# Patient Record
Sex: Female | Born: 1970
Health system: Southern US, Community
[De-identification: ages and names within clinical notes are randomized; demographics above are authoritative.]

## PROBLEM LIST (undated history)

## (undated) DIAGNOSIS — J189 Pneumonia, unspecified organism: Secondary | ICD-10-CM

## (undated) DIAGNOSIS — E785 Hyperlipidemia, unspecified: Secondary | ICD-10-CM

## (undated) DIAGNOSIS — D509 Iron deficiency anemia, unspecified: Secondary | ICD-10-CM

## (undated) DIAGNOSIS — I5032 Chronic diastolic (congestive) heart failure: Secondary | ICD-10-CM

## (undated) DIAGNOSIS — U071 COVID-19: Secondary | ICD-10-CM

## (undated) DIAGNOSIS — E668 Other obesity: Secondary | ICD-10-CM

## (undated) DIAGNOSIS — N182 Chronic kidney disease, stage 2 (mild): Secondary | ICD-10-CM

## (undated) DIAGNOSIS — R9389 Abnormal findings on diagnostic imaging of other specified body structures: Secondary | ICD-10-CM

## (undated) DIAGNOSIS — E559 Vitamin D deficiency, unspecified: Secondary | ICD-10-CM

## (undated) DIAGNOSIS — E669 Obesity, unspecified: Secondary | ICD-10-CM

## (undated) DIAGNOSIS — R0902 Hypoxemia: Secondary | ICD-10-CM

## (undated) DIAGNOSIS — E119 Type 2 diabetes mellitus without complications: Secondary | ICD-10-CM

## (undated) DIAGNOSIS — I10 Essential (primary) hypertension: Secondary | ICD-10-CM

## (undated) DIAGNOSIS — Z9889 Other specified postprocedural states: Secondary | ICD-10-CM

## (undated) DIAGNOSIS — Z5189 Encounter for other specified aftercare: Secondary | ICD-10-CM

## (undated) DIAGNOSIS — N939 Abnormal uterine and vaginal bleeding, unspecified: Secondary | ICD-10-CM

## (undated) DIAGNOSIS — G4730 Sleep apnea, unspecified: Secondary | ICD-10-CM

## (undated) HISTORY — DX: Hypoxemia: R09.02

## (undated) HISTORY — PX: DILATION AND CURETTAGE, DIAGNOSTIC / THERAPEUTIC: SUR384

## (undated) HISTORY — DX: Abnormal uterine and vaginal bleeding, unspecified: N93.9

## (undated) HISTORY — DX: Type 2 diabetes mellitus without complications: E11.9

## (undated) HISTORY — DX: Encounter for other specified aftercare: Z51.89

## (undated) HISTORY — DX: Chronic kidney disease, stage 2 (mild): N18.2

## (undated) HISTORY — DX: Hyperlipidemia, unspecified: E78.5

## (undated) HISTORY — DX: Sleep apnea, unspecified: G47.30

## (undated) HISTORY — DX: Vitamin D deficiency, unspecified: E55.9

## (undated) HISTORY — DX: Obesity, unspecified: E66.9

## (undated) HISTORY — DX: Pneumonia, unspecified organism: J18.9

## (undated) HISTORY — DX: Chronic diastolic (congestive) heart failure: I50.32

## (undated) HISTORY — DX: Other obesity: E66.8

## (undated) HISTORY — PX: CHOLECYSTECTOMY: SHX55

## (undated) HISTORY — DX: Iron deficiency anemia, unspecified: D50.9

## (undated) HISTORY — PX: TONSILLECTOMY AND ADENOIDECTOMY: SUR1326

## (undated) HISTORY — DX: Essential (primary) hypertension: I10

## (undated) HISTORY — DX: COVID-19: U07.1

---
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
## 2004-09-16 ENCOUNTER — Emergency Department: Payer: Self-pay | Admitting: Internal Medicine

## 2005-02-02 ENCOUNTER — Emergency Department: Payer: Self-pay | Admitting: Emergency Medicine

## 2005-04-08 ENCOUNTER — Encounter: Admission: RE | Admit: 2005-04-08 | Discharge: 2005-04-08 | Payer: Self-pay

## 2006-02-11 ENCOUNTER — Emergency Department: Payer: Self-pay | Admitting: Unknown Physician Specialty

## 2006-02-23 ENCOUNTER — Other Ambulatory Visit: Payer: Self-pay

## 2006-02-23 ENCOUNTER — Inpatient Hospital Stay: Payer: Self-pay | Admitting: Internal Medicine

## 2006-07-13 ENCOUNTER — Other Ambulatory Visit: Payer: Self-pay

## 2006-07-13 ENCOUNTER — Inpatient Hospital Stay: Payer: Self-pay | Admitting: *Deleted

## 2006-10-30 ENCOUNTER — Ambulatory Visit: Payer: Self-pay | Admitting: Internal Medicine

## 2007-02-18 ENCOUNTER — Emergency Department: Payer: Self-pay | Admitting: Unknown Physician Specialty

## 2007-04-25 ENCOUNTER — Emergency Department: Payer: Self-pay | Admitting: Emergency Medicine

## 2007-09-24 ENCOUNTER — Emergency Department: Payer: Self-pay | Admitting: Emergency Medicine

## 2008-04-13 ENCOUNTER — Inpatient Hospital Stay: Payer: Self-pay | Admitting: Internal Medicine

## 2009-03-24 ENCOUNTER — Emergency Department: Payer: Self-pay | Admitting: Emergency Medicine

## 2010-11-20 ENCOUNTER — Inpatient Hospital Stay: Payer: Self-pay | Admitting: Specialist

## 2011-05-14 ENCOUNTER — Emergency Department: Payer: Self-pay | Admitting: Internal Medicine

## 2011-05-14 LAB — URINALYSIS, COMPLETE
Glucose,UR: NEGATIVE mg/dL (ref 0–75)
Ketone: NEGATIVE
Nitrite: NEGATIVE
Squamous Epithelial: 7

## 2011-05-16 ENCOUNTER — Emergency Department: Payer: Self-pay | Admitting: Emergency Medicine

## 2011-05-16 LAB — URINALYSIS, COMPLETE
Bilirubin,UR: NEGATIVE
Glucose,UR: NEGATIVE mg/dL (ref 0–75)
Ketone: NEGATIVE
Ph: 6 (ref 4.5–8.0)
Protein: >=500
Specific Gravity: 1.027 (ref 1.003–1.030)
Squamous Epithelial: 7

## 2011-05-16 LAB — COMPREHENSIVE METABOLIC PANEL
Albumin: 2.8 g/dL — ABNORMAL LOW (ref 3.4–5.0)
BUN: 15 mg/dL (ref 7–18)
Calcium, Total: 8.4 mg/dL — ABNORMAL LOW (ref 8.5–10.1)
Chloride: 104 mmol/L (ref 98–107)
Creatinine: 1.15 mg/dL (ref 0.60–1.30)
EGFR (Non-African Amer.): 55 — ABNORMAL LOW
Glucose: 126 mg/dL — ABNORMAL HIGH (ref 65–99)
SGOT(AST): 14 U/L — ABNORMAL LOW (ref 15–37)
Sodium: 142 mmol/L (ref 136–145)

## 2011-05-16 LAB — CBC
MCH: 24.4 pg — ABNORMAL LOW (ref 26.0–34.0)
MCV: 80 fL (ref 80–100)
RBC: 4.51 10*6/uL (ref 3.80–5.20)
RDW: 16.7 % — ABNORMAL HIGH (ref 11.5–14.5)

## 2011-05-18 LAB — URINE CULTURE

## 2012-03-02 ENCOUNTER — Inpatient Hospital Stay: Payer: Self-pay | Admitting: Internal Medicine

## 2012-03-02 LAB — COMPREHENSIVE METABOLIC PANEL
BUN: 9 mg/dL (ref 7–18)
Calcium, Total: 7.9 mg/dL — ABNORMAL LOW (ref 8.5–10.1)
Chloride: 103 mmol/L (ref 98–107)
Co2: 28 mmol/L (ref 21–32)
Creatinine: 0.77 mg/dL (ref 0.60–1.30)
Osmolality: 281 (ref 275–301)
Potassium: 4.2 mmol/L (ref 3.5–5.1)
Sodium: 141 mmol/L (ref 136–145)
Total Protein: 7.5 g/dL (ref 6.4–8.2)

## 2012-03-02 LAB — PRO B NATRIURETIC PEPTIDE: B-Type Natriuretic Peptide: 549 pg/mL — ABNORMAL HIGH (ref 0–125)

## 2012-03-02 LAB — CBC
MCH: 25.8 pg — ABNORMAL LOW (ref 26.0–34.0)
MCHC: 30.8 g/dL — ABNORMAL LOW (ref 32.0–36.0)
RDW: 15.8 % — ABNORMAL HIGH (ref 11.5–14.5)
WBC: 6.3 10*3/uL (ref 3.6–11.0)

## 2012-03-03 LAB — BASIC METABOLIC PANEL
Chloride: 99 mmol/L (ref 98–107)
EGFR (Non-African Amer.): >60
Osmolality: 280 (ref 275–301)
Potassium: 3.6 mmol/L (ref 3.5–5.1)
Sodium: 140 mmol/L (ref 136–145)

## 2012-03-03 LAB — URINALYSIS, COMPLETE
Bilirubin,UR: NEGATIVE
Nitrite: NEGATIVE
Specific Gravity: 1.024 (ref 1.003–1.030)

## 2012-03-03 LAB — PREGNANCY, URINE: Pregnancy Test, Urine: NEGATIVE m[IU]/mL

## 2012-03-04 DIAGNOSIS — I517 Cardiomegaly: Secondary | ICD-10-CM

## 2012-06-08 ENCOUNTER — Emergency Department: Payer: Self-pay | Admitting: Emergency Medicine

## 2012-06-08 LAB — COMPREHENSIVE METABOLIC PANEL
Alkaline Phosphatase: 105 U/L (ref 50–136)
BUN: 9 mg/dL (ref 7–18)
Bilirubin,Total: 0.6 mg/dL (ref 0.2–1.0)
Calcium, Total: 8.4 mg/dL — ABNORMAL LOW (ref 8.5–10.1)
Chloride: 99 mmol/L (ref 98–107)
Co2: 35 mmol/L — ABNORMAL HIGH (ref 21–32)
Creatinine: 0.71 mg/dL (ref 0.60–1.30)
EGFR (African American): >60
Glucose: 164 mg/dL — ABNORMAL HIGH (ref 65–99)
Osmolality: 280 (ref 275–301)
Potassium: 3.9 mmol/L (ref 3.5–5.1)
SGOT(AST): 20 U/L (ref 15–37)
SGPT (ALT): 21 U/L (ref 12–78)
Total Protein: 8.2 g/dL (ref 6.4–8.2)

## 2012-06-08 LAB — CBC
HCT: 37.7 % (ref 35.0–47.0)
MCV: 81 fL (ref 80–100)
Platelet: 361 10*3/uL (ref 150–440)
RDW: 16.1 % — ABNORMAL HIGH (ref 11.5–14.5)

## 2012-06-08 LAB — CK TOTAL AND CKMB (NOT AT ARMC)
CK, Total: 172 U/L (ref 21–215)
CK-MB: 1.3 ng/mL (ref 0.5–3.6)

## 2012-11-04 ENCOUNTER — Ambulatory Visit: Payer: Self-pay

## 2012-11-04 LAB — HM MAMMOGRAPHY

## 2013-01-07 ENCOUNTER — Emergency Department: Payer: Self-pay | Admitting: Internal Medicine

## 2013-02-24 ENCOUNTER — Ambulatory Visit: Payer: Self-pay | Admitting: Obstetrics and Gynecology

## 2013-02-24 LAB — BASIC METABOLIC PANEL
ANION GAP: 1 — AB (ref 7–16)
BUN: 8 mg/dL (ref 7–18)
CALCIUM: 8.8 mg/dL (ref 8.5–10.1)
CO2: 37 mmol/L — AB (ref 21–32)
CREATININE: 0.82 mg/dL (ref 0.60–1.30)
Chloride: 100 mmol/L (ref 98–107)
EGFR (Non-African Amer.): >60
Glucose: 133 mg/dL — ABNORMAL HIGH (ref 65–99)
OSMOLALITY: 276 (ref 275–301)
POTASSIUM: 4.2 mmol/L (ref 3.5–5.1)
Sodium: 138 mmol/L (ref 136–145)

## 2013-02-24 LAB — CBC
HCT: 35.8 % (ref 35.0–47.0)
HGB: 10.7 g/dL — ABNORMAL LOW (ref 12.0–16.0)
MCH: 23.6 pg — ABNORMAL LOW (ref 26.0–34.0)
MCHC: 29.8 g/dL — AB (ref 32.0–36.0)
MCV: 79 fL — ABNORMAL LOW (ref 80–100)
Platelet: 389 10*3/uL (ref 150–440)
RBC: 4.51 10*6/uL (ref 3.80–5.20)
RDW: 16.5 % — AB (ref 11.5–14.5)
WBC: 7.4 10*3/uL (ref 3.6–11.0)

## 2013-03-01 ENCOUNTER — Ambulatory Visit: Payer: Self-pay | Admitting: Obstetrics and Gynecology

## 2013-03-03 LAB — PATHOLOGY REPORT

## 2013-06-17 ENCOUNTER — Emergency Department: Payer: Self-pay | Admitting: Emergency Medicine

## 2013-06-17 LAB — CBC WITH DIFFERENTIAL/PLATELET
BASOS PCT: 0.8 %
Basophil #: 0.1 10*3/uL (ref 0.0–0.1)
Eosinophil #: 0.4 10*3/uL (ref 0.0–0.7)
Eosinophil %: 4.8 %
HCT: 39.8 % (ref 35.0–47.0)
HGB: 11.9 g/dL — ABNORMAL LOW (ref 12.0–16.0)
Lymphocyte #: 2 10*3/uL (ref 1.0–3.6)
Lymphocyte %: 22.4 %
MCH: 24.1 pg — ABNORMAL LOW (ref 26.0–34.0)
MCHC: 29.8 g/dL — AB (ref 32.0–36.0)
MCV: 81 fL (ref 80–100)
Monocyte #: 0.6 x10 3/mm (ref 0.2–0.9)
Monocyte %: 6.7 %
NEUTROS ABS: 6 10*3/uL (ref 1.4–6.5)
Neutrophil %: 65.3 %
PLATELETS: 372 10*3/uL (ref 150–440)
RBC: 4.93 10*6/uL (ref 3.80–5.20)
RDW: 16.8 % — ABNORMAL HIGH (ref 11.5–14.5)
WBC: 9.2 10*3/uL (ref 3.6–11.0)

## 2013-06-17 LAB — COMPREHENSIVE METABOLIC PANEL
ALBUMIN: 2.9 g/dL — AB (ref 3.4–5.0)
ALK PHOS: 111 U/L
ALT: 15 U/L (ref 12–78)
Anion Gap: 4 — ABNORMAL LOW (ref 7–16)
BUN: 12 mg/dL (ref 7–18)
Bilirubin,Total: 0.4 mg/dL (ref 0.2–1.0)
CALCIUM: 8.4 mg/dL — AB (ref 8.5–10.1)
CO2: 34 mmol/L — AB (ref 21–32)
Chloride: 100 mmol/L (ref 98–107)
Creatinine: 0.89 mg/dL (ref 0.60–1.30)
Glucose: 97 mg/dL (ref 65–99)
OSMOLALITY: 275 (ref 275–301)
POTASSIUM: 3.8 mmol/L (ref 3.5–5.1)
SGOT(AST): 18 U/L (ref 15–37)
SODIUM: 138 mmol/L (ref 136–145)
TOTAL PROTEIN: 8.2 g/dL (ref 6.4–8.2)

## 2013-06-17 LAB — URINALYSIS, COMPLETE
BACTERIA: NONE SEEN
BLOOD: NEGATIVE
Bilirubin,UR: NEGATIVE
Glucose,UR: >=500 mg/dL (ref 0–75)
KETONE: NEGATIVE
LEUKOCYTE ESTERASE: NEGATIVE
NITRITE: NEGATIVE
PH: 7 (ref 4.5–8.0)
Protein: 100
Specific Gravity: 1.028 (ref 1.003–1.030)
WBC UR: 3 /HPF (ref 0–5)

## 2013-06-17 LAB — HCG, QUANTITATIVE, PREGNANCY

## 2013-06-17 LAB — LIPASE, BLOOD: LIPASE: 108 U/L (ref 73–393)

## 2013-09-25 ENCOUNTER — Emergency Department: Payer: Self-pay | Admitting: Emergency Medicine

## 2013-09-25 LAB — BASIC METABOLIC PANEL
ANION GAP: 6 — AB (ref 7–16)
BUN: 12 mg/dL (ref 7–18)
CALCIUM: 8.8 mg/dL (ref 8.5–10.1)
CHLORIDE: 101 mmol/L (ref 98–107)
Co2: 34 mmol/L — ABNORMAL HIGH (ref 21–32)
Creatinine: 0.75 mg/dL (ref 0.60–1.30)
EGFR (African American): >60
GLUCOSE: 134 mg/dL — AB (ref 65–99)
OSMOLALITY: 283 (ref 275–301)
Potassium: 4.2 mmol/L (ref 3.5–5.1)
Sodium: 141 mmol/L (ref 136–145)

## 2013-09-25 LAB — CBC WITH DIFFERENTIAL/PLATELET
BASOS ABS: 0.1 10*3/uL (ref 0.0–0.1)
Basophil %: 0.9 %
EOS ABS: 0.5 10*3/uL (ref 0.0–0.7)
EOS PCT: 5.7 %
HCT: 40.6 % (ref 35.0–47.0)
HGB: 12.5 g/dL (ref 12.0–16.0)
LYMPHS ABS: 1.9 10*3/uL (ref 1.0–3.6)
Lymphocyte %: 22.2 %
MCH: 25.2 pg — ABNORMAL LOW (ref 26.0–34.0)
MCHC: 30.7 g/dL — ABNORMAL LOW (ref 32.0–36.0)
MCV: 82 fL (ref 80–100)
MONO ABS: 0.7 x10 3/mm (ref 0.2–0.9)
Monocyte %: 8.4 %
Neutrophil #: 5.3 10*3/uL (ref 1.4–6.5)
Neutrophil %: 62.8 %
Platelet: 324 10*3/uL (ref 150–440)
RBC: 4.96 10*6/uL (ref 3.80–5.20)
RDW: 16.2 % — ABNORMAL HIGH (ref 11.5–14.5)
WBC: 8.5 10*3/uL (ref 3.6–11.0)

## 2013-09-28 LAB — BETA STREP CULTURE(ARMC)

## 2014-05-27 NOTE — H&P (Signed)
PATIENT NAME:  Sara Zhang, Sara Zhang MR#:  [id number] DATE OF BIRTH:  15-Apr-1970  DATE OF ADMISSION:  03/02/2012  CHIEF COMPLAINT: Shortness of breath.   HISTORY OF PRESENT ILLNESS:  This is a 44 year old female who has a history of cardiomyopathy with an EF of 35%. She states she ran out of her medications about a month ago, due to not being able to get them from the [facility]. About 1 week ago, she started noticing worsening shortness of breath with minimal exertion. This got worse last night with orthopnea symptoms, and worse today, comes in and has low O2 sats in the low 80s and dropping into the 70s with any exertion. She has had some chest tightness, but no chest pain and has no other complaints.   PAST MEDICAL HISTORY: 1.  Cardiomyopathy with an EF of 35%.  2.  Hypertension.  3.  Non-insulin-dependent diabetes.  4.  Morbid obesity.  5.  History of sleep apnea.  6.  Hyperlipidemia.  7.  Hypertension.   ALLERGIES: No known drug allergies.   CURRENT MEDICATIONS: Metformin 1000 mg b.i.d., clonidine 0.1 mg daily, glipizide 5 mg b.i.d., metoprolol 25 mg b.i.d., lisinopril 10 mg daily, Lasix 40 mg daily, potassium 10 mEq daily.   SOCIAL HISTORY: She drinks alcohol occasionally, does not smoke.   FAMILY HISTORY: Significant for diabetes.   REVIEW OF SYSTEMS: CONSTITUTIONAL: No fever or chills.  EYES: No blurred vision.  ENT: No hearing loss.  CARDIOVASCULAR: Some chest tightness.  PULMONARY: Some shortness of breath.  GASTROINTESTINAL: No nausea, vomiting, or diarrhea.  GENITOURINARY: No dysuria.  ENDOCRINE: No heat or cold intolerance.  INTEGUMENT: No rash.  MUSCULOSKELETAL: Occasional joint pain.  NEUROLOGIC: No numbness or weakness.   PHYSICAL EXAMINATION: VITAL SIGNS: Temperature 97.1, pulse 98, respirations 20, blood pressure 133/85, and O2 sat was 82% on room air.  GENERAL: This is a well-nourished, obese black female in some moderate respiratory distress.  HEENT: The  pupils are equal, round, and reactive to light. Sclerae nonicteric. Oral mucosa is moist.  Oropharynx is clear. Nasopharynx is clear.  NECK: Supple. No JVD, lymphadenopathy or thyromegaly.  CARDIOVASCULAR: Regular rate and rhythm. No murmurs, rubs, or gallops.  LUNGS: The lung fields sound clear in the upper regions, but there is very distant breath sounds due to her body habitus. Cannot hear any crackles or decreased breath sounds in the lower bases. She is mildly using accessory muscles. There is no dullness to percussion.  ABDOMEN: Obese, nontender, nondistended. Bowel sounds are positive. Unable to ascertain hepatic or splenic enlargement because of her body habitus.  EXTREMITIES: There is 2+ lower extremity edema.  NEUROLOGIC: Cranial nerves II through XII are intact. She is alert and oriented x4.  SKIN: Moist with no rash.   LABORATORY STUDIES: EKG shows sinus tachycardia with a rate of 103.   Chest x-ray shows CHF.   Troponin is 0.03, BNP is 549, hemoglobin 11.2, BUN 9, creatinine 0.77.   ASSESSMENT AND PLAN: 1.  Acute respiratory failure. This is secondary to the congestive heart failure. We will give her oxygen support and treat the underlying cause. See if we can wean her off her oxygen.  2.  Acute systolic congestive heart failure. She has an ejection fraction of 35%. She has been out of her medications. We will give her intravenous Lasix to diurese her acutely. We will restart her angiotensin converting enzyme inhibitor and her beta blocker. She had echo about a year ago, so I will not  repeat that at this point, since we know the underlying cause of the exacerbation.  2.  Hypertension. We will restart her medications. I am going to hold her clonidine for now and see how she does without it. I will increase her angiotensin converting enzyme inhibitor if needed rather than restarting her clonidine if she needs better control.  3.  Non-insulin-dependent diabetes. We will continue her  current medications with metoprolol and glipizide, give her sliding scale if necessary.  4.  Sleep apnea. She is wearing oxygen at night. We will continue that. She could probably benefit from continuous positive airway pressure. We will try to arrange for further testing as an outpatient.  5. Disposition.  She is seen at the [facility]. She had trouble getting medications.  We will get case management involved to see if they can help her coordinate with this; especially at discharge, she will need medications upon discharge.   TIME SPENT ON ADMISSION: 50 minutes.    ____________________________ Gracelyn Nurse, MD jdj:cc D: 03/02/2012 16:11:21 ET T: 03/02/2012 16:49:36 ET JOB#: [id number]  cc: Gracelyn Nurse, MD, <Dictator> [facility]  Gracelyn Nurse MD ELECTRONICALLY SIGNED 03/03/2012 17:22

## 2014-05-27 NOTE — Discharge Summary (Signed)
PATIENT NAME:  Sara Zhang, Sara Zhang MR#:  [id number] DATE OF BIRTH:  [birth year]  DATE OF ADMISSION:  03/02/2012 DATE OF DISCHARGE:  03/04/2012  PRIMARY CARE PHYSICIAN: [facility]   DISCHARGE DIAGNOSES: 1. Acute systolic congestive heart failure with cardiomyopathy, ejection fraction of 25% to 35%, moderate global hypokinesis.  2. Acute respiratory failure, now chronic, pulse oximetry 82% on room air.  3. Sleep apnea. 4. Obesity.  5. Hypertension.  6. Diabetes.  7. Urinary tract infection.   MEDICATIONS ON DISCHARGE: 1. Glipizide 5 mg twice a day. 2. Lisinopril 5 mg daily.  3. Lasix 40 mg twice a day. 4. Metoprolol Extended-Release 25 mg once a day. 5. Cipro 500 mg twice a day for 3 days.   NOTE: Stop taking metformin and clonidine.   OXYGEN: 2 liters nasal cannula.   REFERRAL: Home Health nurse.   DIET: Low sodium diet, 1800 ADA diet.   ACTIVITY: Activity as tolerated.   FOLLOWUP: At the [facility] the first available appointment and needs BMP upon follow-up appointment.    REASON FOR ADMISSION: The patient came in with shortness of breath.  HISTORY OF PRESENT ILLNESS: The patient is a 44 year old female with cardiomyopathy. heart failure, stopped taking her medications about a month ago, came in with respiratory failure with a low pulse ox and shortness of breath. The patient was admitted with acute respiratory failure, acute systolic congestive heart failure, hypertension, diabetes and sleep apnea.   HOSPITAL LABORATORY, DIAGNOSTIC AND RADIOLOGICAL DATA:  EKG showed sinus tachycardia, left atrial enlargement. Chest x-ray showed interstitial infiltrate likely representing pulmonary edema. Echocardiogram showed an EF of 25% to 35%, moderate global hypokinesis, left ventricle mildly dilated, mild aortic root dilation.   Glucose 113, BUN 9, creatinine 0.77, sodium 141, potassium 4.2, chloride 103, CO2 28, calcium 7.9. Liver function tests normal range. Albumin low at  2.7. White blood cell count 6.3, hemoglobin and hematocrit 11.2 and 36.3, platelet count of 318. BNP 549. Troponin negative.  Urine culture holding for possible pathogen.  Urinalysis: Trace leukocyte esterase.  HOSPITAL COURSE PER PROBLEM LIST:  1. Acute systolic congestive heart failure and cardiomyopathy: The patient is on Lasix 40 mg twice a day, lisinopril and metoprolol. I think restarting the medications will help out with this. Need diuresis twice a day initially, then probably can be dropped down to once a day.  2. Acute respiratory failure, probably now chronic: Most likely hypoventilation and obesity syndrome. Oxygen set up at home.  3. Sleep apnea and obesity: Needs to get back with [facility] for a sleep study and set up a CPAP at home.  4. Hypertension: Blood pressure on the lower side upon discharge, 97/70. I do like blood pressure on the lower side, but we did have to cut back on medication a little bit.  5. Diabetes: The patient is on glipizide.  6. Urinary tract infection: Cipro was given for 3 days.   TIME SPENT ON DISCHARGE: 35 minutes.  ____________________________ Herschell Dimes. Renae Gloss, MD rjw:cb D: 03/05/2012 15:19:20 ET T: 03/05/2012 16:21:50 ET JOB#: [id number] cc: Herschell Dimes. Renae Gloss, MD, <Dictator> [facility] Salley Scarlet MD ELECTRONICALLY SIGNED 03/06/2012 15:59

## 2014-05-28 NOTE — Op Note (Signed)
PATIENT NAME:  Sara Zhang, Sara Zhang MR#:  [id number] DATE OF BIRTH:  1970-05-01  DATE OF PROCEDURE:  03/01/2013  PREOPERATIVE DIAGNOSES: 1.  Abnormal uterine bleeding/menorrhagia.  2.  Endometrial polyps.  3.  Pelvic pain.   POSTOPERATIVE DIAGNOSES:   1.  Abnormal uterine bleeding/menorrhagia.  2.  Endometrial polyps.  3.  Pelvic pain.   OPERATIVE PROCEDURES: 1.  Hysteroscopy.  2.  Dilation and curettage of the endometrium.  3.  NovaSure endometrial ablation.   SURGEON: Sharon Seller, MD   FIRST ASSISTANT: None.   ANESTHESIA: General endotracheal.   INDICATIONS: The patient is a 44 year old morbidly obese African American female para 1-0-2-1 who presents for surgical management of chronic abnormal uterine bleeding which has been refractory to conservative medical therapy. Preoperative ultrasound demonstrated a thickened endometrial lining that was heterogenous. Preoperative endometrial biopsy demonstrated endometrial polyps without atypia or malignancy. A trial of progestin therapy did not regulate bleeding. The patient desires definitive surgery.   FINDINGS AT SURGERY: Revealed a uterine cavity measuring 12 cm on sounding. Endometrial polyps were noted diffusely within the endometrial cavity. Post ablation there was notable nonthermal burn of the uterine fundus centrally (this is likely due to the uterine cavity length of 7 cm, which was slightly above the maximum for endometrial ablation with NovaSure).   DESCRIPTION OF PROCEDURE: The patient was brought to the operating room where she was placed in the supine position and general endotracheal anesthesia was induced. She was placed in dorsal lithotomy position using the bumblebee stirrups. A Betadine perineal and intravaginal prep and drape was performed in standard fashion. A red Robinson catheter was used to drain 10 mL of urine from the bladder. A Sims retractor was placed through the vagina and the cervix was grasped with a  single-tooth tenaculum. The uterus was sounded to 12 cm. Hegar dilators were used up to a number 8-French. The cervical length was 5 cm. The [facility] hysteroscope using lactated Ringers as irrigant was used to assess the intrauterine cavity. Photo documentation was taken. Curettage was then performed with both smooth and serrated curettes. Stone polyp forceps were used to help facilitate removal of tissue within the endometrial cavity. Once satisfied with adequate removal of tissue from within the endometrial cavity, the NovaSure ablation was performed in routine fashion. The cavity length was 7 cm. Cavity width was 4.3 cm. Cervical length was 5 cm. Cavity test was intact and ablation was performed in routine manner with the instrument being activated for 54 seconds. Upon completion of the procedure, all instrumentation was removed from the vagina. Repeat hysteroscopy demonstrated a good thermal burn throughout the cavity, except centrally in the fundal region which still had an area of viable tissue. The procedure was then terminated with all instrumentation being removed from the vagina. The patient was then awakened, extubated, and taken to the recovery room in satisfactory condition. Estimated blood loss was less than 25 mL. IV fluids were 200 mL of crystalloid. Urine output was 10 mL. All instruments, needle, and sponge counts were verified as correct.   ____________________________ Prentice Docker. Abiola Behring, MD mad:sb D: 03/01/2013 10:33:01 ET T: 03/01/2013 11:30:59 ET JOB#: [id number]  cc: Daphine Deutscher A. Samanthia Howland, MD, <Dictator> Prentice Docker Mattias Walmsley MD ELECTRONICALLY SIGNED 03/02/2013 7:38

## 2014-08-05 ENCOUNTER — Telehealth: Payer: Self-pay | Admitting: Unknown Physician Specialty

## 2014-08-05 MED ORDER — AZITHROMYCIN 250 MG PO TABS: ORAL_TABLET | ORAL | Status: DC

## 2014-08-05 NOTE — Telephone Encounter (Signed)
I sent a Z pack in.  But I will not call in Tussionex without seeing the patient.  She will need to use OTC or go to urgent care

## 2014-08-05 NOTE — Telephone Encounter (Signed)
Routing to provider.Marland KitchenMarland KitchenMarland KitchenJudeth Cornfield informed patient that she would need to be seen but the patient wanted to ask anyway.

## 2014-08-05 NOTE — Telephone Encounter (Signed)
Pt called stated that would like Cheryl to call her in something for a cold and cough. Pt informed she would need to be seen. Pt wanted message sent anyway. Pharm is [facility] on [address]. Please call pt @ [phone] with any issues or concerns. Thanks.

## 2014-08-05 NOTE — Telephone Encounter (Signed)
Patient notified

## 2014-10-07 ENCOUNTER — Other Ambulatory Visit: Payer: Self-pay | Admitting: Unknown Physician Specialty

## 2014-11-10 DIAGNOSIS — I110 Hypertensive heart disease with heart failure: Secondary | ICD-10-CM

## 2014-11-10 DIAGNOSIS — G4733 Obstructive sleep apnea (adult) (pediatric): Secondary | ICD-10-CM | POA: Insufficient documentation

## 2014-11-10 DIAGNOSIS — E1169 Type 2 diabetes mellitus with other specified complication: Secondary | ICD-10-CM | POA: Insufficient documentation

## 2014-11-10 DIAGNOSIS — E1122 Type 2 diabetes mellitus with diabetic chronic kidney disease: Secondary | ICD-10-CM

## 2014-11-10 DIAGNOSIS — R0902 Hypoxemia: Secondary | ICD-10-CM | POA: Insufficient documentation

## 2014-11-10 DIAGNOSIS — N182 Chronic kidney disease, stage 2 (mild): Secondary | ICD-10-CM | POA: Insufficient documentation

## 2014-11-10 DIAGNOSIS — I130 Hypertensive heart and chronic kidney disease with heart failure and stage 1 through stage 4 chronic kidney disease, or unspecified chronic kidney disease: Secondary | ICD-10-CM | POA: Insufficient documentation

## 2014-11-10 DIAGNOSIS — G4730 Sleep apnea, unspecified: Secondary | ICD-10-CM

## 2014-11-10 DIAGNOSIS — E785 Hyperlipidemia, unspecified: Secondary | ICD-10-CM | POA: Insufficient documentation

## 2014-11-10 DIAGNOSIS — I129 Hypertensive chronic kidney disease with stage 1 through stage 4 chronic kidney disease, or unspecified chronic kidney disease: Secondary | ICD-10-CM

## 2014-11-10 DIAGNOSIS — E559 Vitamin D deficiency, unspecified: Secondary | ICD-10-CM | POA: Insufficient documentation

## 2014-11-10 DIAGNOSIS — I501 Left ventricular failure: Secondary | ICD-10-CM | POA: Insufficient documentation

## 2014-11-14 ENCOUNTER — Ambulatory Visit: Payer: Self-pay | Admitting: Unknown Physician Specialty

## 2014-12-11 ENCOUNTER — Other Ambulatory Visit: Payer: Self-pay | Admitting: Unknown Physician Specialty

## 2014-12-27 ENCOUNTER — Encounter: Payer: Self-pay | Admitting: Unknown Physician Specialty

## 2015-01-17 ENCOUNTER — Other Ambulatory Visit: Payer: Self-pay | Admitting: Unknown Physician Specialty

## 2015-04-10 ENCOUNTER — Other Ambulatory Visit: Payer: Self-pay | Admitting: Unknown Physician Specialty

## 2015-04-25 ENCOUNTER — Telehealth: Payer: Self-pay

## 2015-04-25 ENCOUNTER — Encounter: Payer: Self-pay | Admitting: Unknown Physician Specialty

## 2015-04-25 ENCOUNTER — Ambulatory Visit (INDEPENDENT_AMBULATORY_CARE_PROVIDER_SITE_OTHER): Payer: Medicare Other | Admitting: Unknown Physician Specialty

## 2015-04-25 VITALS — BP 133/84 | HR 93 | Temp 98.9°F | Ht 67.6 in | Wt 375.6 lb

## 2015-04-25 DIAGNOSIS — E785 Hyperlipidemia, unspecified: Secondary | ICD-10-CM

## 2015-04-25 DIAGNOSIS — I129 Hypertensive chronic kidney disease with stage 1 through stage 4 chronic kidney disease, or unspecified chronic kidney disease: Secondary | ICD-10-CM

## 2015-04-25 DIAGNOSIS — R0902 Hypoxemia: Secondary | ICD-10-CM

## 2015-04-25 DIAGNOSIS — N182 Chronic kidney disease, stage 2 (mild): Secondary | ICD-10-CM | POA: Diagnosis not present

## 2015-04-25 DIAGNOSIS — R1011 Right upper quadrant pain: Secondary | ICD-10-CM

## 2015-04-25 DIAGNOSIS — Z794 Long term (current) use of insulin: Secondary | ICD-10-CM | POA: Diagnosis not present

## 2015-04-25 DIAGNOSIS — I110 Hypertensive heart disease with heart failure: Secondary | ICD-10-CM

## 2015-04-25 DIAGNOSIS — E1122 Type 2 diabetes mellitus with diabetic chronic kidney disease: Secondary | ICD-10-CM

## 2015-04-25 DIAGNOSIS — E119 Type 2 diabetes mellitus without complications: Secondary | ICD-10-CM | POA: Insufficient documentation

## 2015-04-25 MED ORDER — BLOOD GLUCOSE MONITOR KIT: PACK | Status: DC

## 2015-04-25 NOTE — Assessment & Plan Note (Addendum)
Await Hgb A1C.  Rx for Glucometer so she doesn't have to borrow someone elses

## 2015-04-25 NOTE — Assessment & Plan Note (Signed)
Stable on 2 Liters O2

## 2015-04-25 NOTE — Assessment & Plan Note (Signed)
Check lipid panel.  Not fasting

## 2015-04-25 NOTE — Telephone Encounter (Addendum)
Called patient to notify her of her Ultrasound.  No answer, left message to return phone call.   Patient is scheduled for her Ultrasound 05-01-2015 @ 8:00am (arrive at 7:45) at the [facility] off of [address].  No Food or Drink after midnight.

## 2015-04-25 NOTE — Assessment & Plan Note (Signed)
Not to goal but reasonable control

## 2015-04-25 NOTE — Assessment & Plan Note (Signed)
Lost some weight

## 2015-04-25 NOTE — Progress Notes (Signed)
BP 133/84 mmHg  Pulse 93  Temp(Src) 98.9 F (37.2 C)  Ht 5' 7.6" (1.717 m)  Wt 375 lb 9.6 oz (170.371 kg)  BMI 57.79 kg/m2  SpO2 88%  LMP 04/25/2014 (Approximate)   Subjective:    Patient ID: Sara Zhang, female    DOB: 09-Sep-1970, 45 y.o.   MRN: [id number]  HPI: Sara Zhang is a 45 y.o. female  Chief Complaint  Patient presents with  . Diabetes  . Hyperlipidemia  . Hypertension  . Referral    pt would like a referral to go see Dr. Clide Cliff for her big toe   Diabetes:  Taking 50 u of long acting insulin at night  Using medications without difficulties No hypoglycemic episodes No hyperglycemic episodes Feet problems:  Blood Sugars averaging:Only using a machine when she borrows.  She is having a lot of issues including her mom dying.   eye exam within last year  Hypertension:  Using medications without difficulty Average home BPs 120s/80's but not feeling well last few weeks with stomach pain  Using medication without problems or lightheadedness O2 dependent No Edema    Elevated Cholesterol: Using medications without problems: No Muscle aches Diet compliance: Not watching Exercise: Unable to exercise  Abdominal pain X 1 1/2 weeks. Pain comes and goes.  Sometimes cramping and sometimes pressure.  No nausea or diarrhea.  She had a colonoscopy x 2 years ago.    Relevant past medical, surgical, family and social history reviewed and updated as indicated. Interim medical history since our last visit reviewed. Allergies and medications reviewed and updated.  Review of Systems  Constitutional: Positive for fatigue and unexpected weight change.       Weight gain  HENT:       Sick with a head cold  Respiratory:       On O2  Cardiovascular: Negative.   Gastrointestinal: Positive for abdominal pain.  Skin: Negative.     Per HPI unless specifically indicated above     Objective:    BP 133/84 mmHg  Pulse 93  Temp(Src) 98.9 F (37.2 C)  Ht 5' 7.6"  (1.717 m)  Wt 375 lb 9.6 oz (170.371 kg)  BMI 57.79 kg/m2  SpO2 88%  LMP 04/25/2014 (Approximate)  Wt Readings from Last 3 Encounters:  04/25/15 375 lb 9.6 oz (170.371 kg)  06/17/14 390 lb (176.903 kg)    Physical Exam  Constitutional: She is oriented to person, place, and time. She appears well-developed and well-nourished. No distress.  HENT:  Head: Normocephalic and atraumatic.  Eyes: Conjunctivae and lids are normal. Right eye exhibits no discharge. Left eye exhibits no discharge. No scleral icterus.  Cardiovascular: Normal rate, regular rhythm and normal heart sounds.   Pulmonary/Chest: Effort normal.  Breath sounds distant  Abdominal: Soft. Normal appearance. There is no splenomegaly or hepatomegaly. There is tenderness in the right upper quadrant. There is negative Murphy's sign.  Musculoskeletal: Normal range of motion.  Neurological: She is alert and oriented to person, place, and time.  Skin: Skin is intact. No rash noted. No pallor.  Psychiatric: She has a normal mood and affect. Her behavior is normal. Judgment and thought content normal.    Results for orders placed or performed in visit on 11/10/14  [facility] MAMMOGRAPHY  Result Value Ref Range   [facility] Mammogram from [facility]       Assessment & Plan:   Problem List Items Addressed This Visit      Unprioritized   Hyperlipidemia  Check lipid panel.  Not fasting      Relevant Orders   Lipid Panel w/o Chol/HDL Ratio   Hypoxia    Stable on 2 Liters O2      Extreme obesity (HCC)    Lost some weight      Relevant Orders   Hgb A1c w/o eAG   Hypertensive CKD (chronic kidney disease)    Not to goal but reasonable control      Hypertensive heart disease with congestive heart failure (HCC)   Relevant Orders   Uric acid   Type 2 diabetes mellitus with chronic kidney disease (HCC) - Primary    Await Hgb A1C.  Rx for Glucometer so she doesn't have to borrow someone elses      Relevant Orders   Ambulatory referral to  Podiatry   Ambulatory referral to Ophthalmology   Comprehensive metabolic panel   Ferritin   Hgb A1c w/o eAG    Other Visit Diagnoses    Right upper quadrant pain        Difficult to evaluated due to obesity.  Will order an abdominal US.  Consider abdominal and pelvic CT.  check CBC, Amylase, Lipase    Relevant Orders    CBC with Differential/Platelet    Amylase    Lipase    Ferritin    US Abdomen Complete       Referrals to podiatry and Opthamology made  Follow up plan: Return for physical.

## 2015-04-25 NOTE — Assessment & Plan Note (Signed)
Unclear status.  Check  Hgb A1 C.  Order a Glucometer so she doesn't have to borrow someone elses

## 2015-04-26 ENCOUNTER — Telehealth: Payer: Self-pay | Admitting: Unknown Physician Specialty

## 2015-04-26 LAB — COMPREHENSIVE METABOLIC PANEL
A/G RATIO: 1 — AB (ref 1.2–2.2)
ALK PHOS: 113 IU/L (ref 39–117)
ALT: 13 IU/L (ref 0–32)
AST: 13 IU/L (ref 0–40)
Albumin: 3.6 g/dL (ref 3.5–5.5)
BUN/Creatinine Ratio: 13 (ref 9–23)
BUN: 11 mg/dL (ref 6–24)
Bilirubin Total: 0.3 mg/dL (ref 0.0–1.2)
CALCIUM: 9.2 mg/dL (ref 8.7–10.2)
CO2: 32 mmol/L — AB (ref 18–29)
Chloride: 97 mmol/L (ref 96–106)
Creatinine, Ser: 0.84 mg/dL (ref 0.57–1.00)
GFR calc Af Amer: 96 mL/min/{1.73_m2} (ref >59)
GFR, EST NON AFRICAN AMERICAN: 84 mL/min/{1.73_m2} (ref >59)
Globulin, Total: 3.5 g/dL (ref 1.5–4.5)
Glucose: 118 mg/dL — ABNORMAL HIGH (ref 65–99)
POTASSIUM: 4.9 mmol/L (ref 3.5–5.2)
Sodium: 142 mmol/L (ref 134–144)
Total Protein: 7.1 g/dL (ref 6.0–8.5)

## 2015-04-26 LAB — LIPID PANEL W/O CHOL/HDL RATIO
CHOLESTEROL TOTAL: 153 mg/dL (ref 100–199)
HDL: 44 mg/dL (ref >39)
LDL CALC: 91 mg/dL (ref 0–99)
Triglycerides: 90 mg/dL (ref 0–149)
VLDL CHOLESTEROL CAL: 18 mg/dL (ref 5–40)

## 2015-04-26 LAB — CBC WITH DIFFERENTIAL/PLATELET
BASOS ABS: 0 10*3/uL (ref 0.0–0.2)
BASOS: 0 %
EOS (ABSOLUTE): 0.3 10*3/uL (ref 0.0–0.4)
Eos: 3 %
Hematocrit: 38.7 % (ref 34.0–46.6)
Hemoglobin: 12.1 g/dL (ref 11.1–15.9)
IMMATURE GRANS (ABS): 0 10*3/uL (ref 0.0–0.1)
IMMATURE GRANULOCYTES: 0 %
LYMPHS: 23 %
Lymphocytes Absolute: 2.2 10*3/uL (ref 0.7–3.1)
MCH: 26.3 pg — ABNORMAL LOW (ref 26.6–33.0)
MCHC: 31.3 g/dL — ABNORMAL LOW (ref 31.5–35.7)
MCV: 84 fL (ref 79–97)
Monocytes Absolute: 0.6 10*3/uL (ref 0.1–0.9)
Monocytes: 6 %
NEUTROS PCT: 68 %
Neutrophils Absolute: 6.5 10*3/uL (ref 1.4–7.0)
PLATELETS: 439 10*3/uL — AB (ref 150–379)
RBC: 4.6 x10E6/uL (ref 3.77–5.28)
RDW: 14.6 % (ref 12.3–15.4)
WBC: 9.7 10*3/uL (ref 3.4–10.8)

## 2015-04-26 LAB — URIC ACID: URIC ACID: 6.1 mg/dL (ref 2.5–7.1)

## 2015-04-26 LAB — HGB A1C W/O EAG: Hgb A1c MFr Bld: 9.9 % — ABNORMAL HIGH (ref 4.8–5.6)

## 2015-04-26 LAB — LIPASE: LIPASE: 22 U/L (ref 0–59)

## 2015-04-26 LAB — AMYLASE: AMYLASE: 63 U/L (ref 31–124)

## 2015-04-26 LAB — FERRITIN: FERRITIN: 67 ng/mL (ref 15–150)

## 2015-04-26 MED ORDER — GLIPIZIDE 10 MG PO TABS: 10.0000 mg | ORAL_TABLET | Freq: Every day | ORAL | Status: DC

## 2015-04-26 NOTE — Telephone Encounter (Signed)
Discussed with pt high Hgb A1C.  Increase Glipizide from 5 mg to 10 mg.  Discussed exercise and diet

## 2015-04-28 NOTE — Telephone Encounter (Signed)
I have attempted to contact the patient multiple times to try and remind her of her appointment. I can no longer leave voicemails because her voicemail box is full.  Her appointment is this coming Monday at 8:00am. If I can't get in touch with the patient before then I will send a letter stating she needs to call back and reschedule this appointment as soon as possible.

## 2015-05-01 ENCOUNTER — Ambulatory Visit: Payer: Medicaid Other

## 2015-05-03 DIAGNOSIS — M79675 Pain in left toe(s): Secondary | ICD-10-CM | POA: Diagnosis not present

## 2015-05-03 DIAGNOSIS — M79674 Pain in right toe(s): Secondary | ICD-10-CM | POA: Diagnosis not present

## 2015-05-03 DIAGNOSIS — E119 Type 2 diabetes mellitus without complications: Secondary | ICD-10-CM | POA: Diagnosis not present

## 2015-05-03 DIAGNOSIS — B351 Tinea unguium: Secondary | ICD-10-CM | POA: Diagnosis not present

## 2015-05-05 NOTE — Telephone Encounter (Signed)
Letter Sent to patient stating to reschedule her appointment.

## 2015-05-10 ENCOUNTER — Telehealth: Payer: Self-pay

## 2015-05-10 NOTE — Telephone Encounter (Signed)
Tried calling patient to tell her of her radiology appointment to have an ultrasound.   Appointment is scheduled for 05/17/2015 at 9:30 (arrive 15 minutes early) [facility] off of [address]. No food or drink after midnight.

## 2015-05-17 ENCOUNTER — Ambulatory Visit
Admission: RE | Admit: 2015-05-17 | Discharge: 2015-05-17 | Disposition: A | Discharge status: Home / Self Care | Payer: Medicare Other | Source: Ambulatory Visit | Attending: Unknown Physician Specialty | Admitting: Unknown Physician Specialty

## 2015-05-17 DIAGNOSIS — Z9049 Acquired absence of other specified parts of digestive tract: Secondary | ICD-10-CM | POA: Diagnosis not present

## 2015-05-17 DIAGNOSIS — R1011 Right upper quadrant pain: Secondary | ICD-10-CM | POA: Diagnosis not present

## 2015-05-19 DIAGNOSIS — G4733 Obstructive sleep apnea (adult) (pediatric): Secondary | ICD-10-CM | POA: Diagnosis not present

## 2015-06-07 LAB — HM DIABETES EYE EXAM

## 2015-06-12 ENCOUNTER — Telehealth: Payer: Self-pay

## 2015-06-12 MED ORDER — INSULIN GLARGINE 300 UNIT/ML ~~LOC~~ SOPN: 50.0000 [IU] | PEN_INJECTOR | Freq: Every day | SUBCUTANEOUS | Status: DC

## 2015-06-12 NOTE — Telephone Encounter (Signed)
Pharmacy sent a fax stating that patient's levemir is not being covered. They state that lantus or toujeo is preferred. Pharmacy is [facility].

## 2015-06-16 ENCOUNTER — Encounter: Payer: Medicaid Other | Admitting: Unknown Physician Specialty

## 2015-07-13 ENCOUNTER — Other Ambulatory Visit: Payer: Self-pay | Admitting: Unknown Physician Specialty

## 2015-07-18 ENCOUNTER — Telehealth: Payer: Self-pay

## 2015-07-18 MED ORDER — EXENATIDE ER 2 MG ~~LOC~~ PEN: 2.0000 mg | PEN_INJECTOR | SUBCUTANEOUS | Status: DC

## 2015-07-18 NOTE — Telephone Encounter (Signed)
I have her on Toujeo.  I will put an order in for [facility]

## 2015-07-18 NOTE — Telephone Encounter (Signed)
Tried to call patient to let her know about the medications but she did not answer and there was no voicemail available. Will try to call again later.

## 2015-07-18 NOTE — Telephone Encounter (Signed)
Called and spoke to pharmacy because I got a fax about a PA for patient's tanzeum. They want the patient to try bydureon or bayada first. Pharmacy also asked if the patient is supposed to be taking levemir because it is needing a PA as well. I told them that I did not have levemir in patient's current med list but I would ask the provider.

## 2015-07-18 NOTE — Telephone Encounter (Signed)
Called and let pharmacy know what Elnita Maxwell said.

## 2015-07-19 NOTE — Telephone Encounter (Signed)
Called and left patient a voicemail asking for her to please return my call.  

## 2015-07-19 NOTE — Telephone Encounter (Signed)
Patient returned call and I let her know about the medication change.

## 2015-08-02 ENCOUNTER — Ambulatory Visit (INDEPENDENT_AMBULATORY_CARE_PROVIDER_SITE_OTHER): Payer: Medicare Other | Admitting: Unknown Physician Specialty

## 2015-08-02 ENCOUNTER — Encounter: Payer: Self-pay | Admitting: Unknown Physician Specialty

## 2015-08-02 VITALS — BP 142/96 | HR 78 | Temp 98.5°F | Ht 68.1 in | Wt 380.8 lb

## 2015-08-02 DIAGNOSIS — I110 Hypertensive heart disease with heart failure: Secondary | ICD-10-CM

## 2015-08-02 DIAGNOSIS — Z0000 Encounter for general adult medical examination without abnormal findings: Secondary | ICD-10-CM

## 2015-08-02 DIAGNOSIS — Z794 Long term (current) use of insulin: Secondary | ICD-10-CM | POA: Diagnosis not present

## 2015-08-02 DIAGNOSIS — E785 Hyperlipidemia, unspecified: Secondary | ICD-10-CM | POA: Diagnosis not present

## 2015-08-02 DIAGNOSIS — N182 Chronic kidney disease, stage 2 (mild): Secondary | ICD-10-CM | POA: Diagnosis not present

## 2015-08-02 DIAGNOSIS — E1122 Type 2 diabetes mellitus with diabetic chronic kidney disease: Secondary | ICD-10-CM

## 2015-08-02 LAB — LIPID PANEL PICCOLO, WAIVED
CHOL/HDL RATIO PICCOLO,WAIVE: 3.2 mg/dL
Cholesterol Piccolo, Waived: 137 mg/dL (ref <200)
HDL Chol Piccolo, Waived: 43 mg/dL — ABNORMAL LOW (ref >59)
LDL CHOL CALC PICCOLO WAIVED: 78 mg/dL (ref <100)
TRIGLYCERIDES PICCOLO,WAIVED: 80 mg/dL (ref <150)
VLDL CHOL CALC PICCOLO,WAIVE: 16 mg/dL (ref <30)

## 2015-08-02 LAB — BAYER DCA HB A1C WAIVED: HB A1C: 8.1 % — AB (ref <7.0)

## 2015-08-02 NOTE — Patient Instructions (Signed)
Bariatric Surgery Information Bariatric surgery, also called weight loss surgery, is a procedure that helps you lose weight. You may consider or your health care provider may suggest bariatric surgery if:  You are severely obese and have been unable to lose weight through diet and exercise.  You have health problems related to obesity, such as:  Type 2 diabetes.  Heart disease.  Lung disease. HOW DOES BARIATRIC SURGERY HELP ME LOSE WEIGHT?  Bariatric surgery helps you lose weight by decreasing how much food your body absorbs. This is done by closing off part of your stomach to make it smaller. This restricts the amount of food your stomach can hold. Bariatric surgery can also change your body's regular digestive process, so that food bypasses the parts of your body that absorb calories and nutrients.  If you decide to have bariatric surgery, it is important to continue to eat a healthy diet and exercise regularly after the surgery.  WHAT ARE THE DIFFERENT KINDS OF BARIATRIC SURGERY?  There are two kinds of bariatric surgeries:  Restrictive surgeries make your stomach smaller. They do not change your digestive process. The smaller the size of your new stomach, the less food you can eat. There are different types of restrictive surgeries.  Malabsorptive surgeries both make your stomach smaller and alter your digestive process so that your body processes less calories and nutrients. These are the most common kind of bariatric surgery. There are different types of malabsorptive surgeries. WHAT ARE THE DIFFERENT TYPES OF RESTRICTIVE SURGERY? Adjustable Gastric Banding In this procedure, an inflatable band is placed around your stomach near the upper end. This makes the passageway for food into the rest of your stomach much smaller. The band can be adjusted, making it tighter or looser, by filling it with salt solution. Your surgeon can adjust the band based on how are you feeling and how much  weight you are losing. The band can be removed in the future.  Vertical Banded Gastroplasty In this procedure, staples are used to separate your stomach into two parts, a small upper pouch and a bigger lower pouch. This decreases how much food you can eat. Sleeve Gastrectomy In this procedure, your stomach is made smaller. This is done by surgically removing a large part of your stomach. When your stomach is smaller, you feel full more quickly and reduce how much you eat. WHAT ARE THE DIFFERENT TYPES OF MALABSORPTIVE SURGERY? Roux-en-Y Gastric Bypass (RGB) This is the most common weight loss surgery. In this procedure, a small stomach pouch is created in the upper part of your stomach. Next, this small stomach pouch is attached directly to the middle part of your small intestine. The farther down your small intestine the new connection is made, the fewer calories and nutrients you will absorb.  Biliopancreatic Diversion with Duodenal Switch (BPD/DS)  This is a multi-step procedure. In this procedure, a large part of your stomach is removed, making your stomach smaller. Next, this smaller stomach is attached to the lower part of your small intestine. Like the RGB surgery, you absorb fewer calories and nutrients the farther down your small intestine the attachment is made.   WHAT ARE THE RISKS OF BARIATRIC SURGERY? As with any surgical procedure, each type of bariatric surgery has its own risks. These risks also depend on your age, your overall health, and any other medical conditions you may have. When deciding on bariatric surgery, it is very important to:   Talk to your health care provider   and choose the surgery that is best for you.  Ask your health care provider about specific risks for the surgery you choose. FOR MORE INFORMATION  American Society for Metabolic & Bariatric Surgery: www.asmbs.org  Weight-control Information Network (WIN): win.StageSync.si   This information is not  intended to replace advice given to you by your health care provider. Make sure you discuss any questions you have with your health care provider.   Document Released: 01/21/2005 Document Revised: 10/12/2014 Document Reviewed: 07/22/2012 Elsevier Interactive Patient Education 2016 Elsevier Inc. Bariatric Surgery, Care After Refer to this sheet in the next few weeks. These instructions provide you with information on caring for yourself after your procedure. Your health care provider may also give you more specific instructions. Your treatment has been planned according to current medical practices, but problems sometimes occur. Call your health care provider if you have any problems or questions after your procedure. WHAT TO EXPECT AFTER THE PROCEDURE After your procedure, it is typical to have the following sensations:  Soreness.  Sluggishness.  Tiredness.  Moodiness.  Chilliness. It is not unusual to have dry skin and some hair loss after bariatric surgery. HOME CARE INSTRUCTIONS  Do not drink alcohol, use public transportation, or sign important papers for at least 1 day following surgery.  Do not resume physical activities or drive until directed by your surgeon.  Avoid lifting anything over 10 pounds (4.5 kg) for 6 weeks following surgery, or until approved by your surgeon.   Only take over-the-counter or prescription medicines for pain, discomfort, or fever as directed by your surgeon.   Resume your diet as directed by your surgeon. You will resume eating with liquids and pureed foods, then soft foods, and progress to a more normal diet over time. Follow your surgeon or dietitian's guidelines for what and how much to eat and drink. You will need to eat slowly, so as not to cause discomfort and vomiting.  Use showers for bathing as directed by your surgeon.   Change dressings as directed by your surgeon.  Schedule a follow-up appointment with your surgeon as  directed. SEEK MEDICAL CARE IF:   There is redness, swelling, or increasing pain in the wound.   There is pus coming from the wound.   There is drainage from the wound lasting longer than 1 day.   An unexplained oral temperature above 102F (38.9C) develops.   You notice a foul smell coming from the wound or dressing.   There is a breaking open of the wound (edges not staying together) after stitches have been removed.   You notice increasing pain in the shoulder-strap areas.   You develop episodes of dizziness or faint while standing.   You develop shortness of breath.   You develop persistent nausea or vomiting.   Your soreness seems to be getting worse rather than better.  SEEK IMMEDIATE MEDICAL CARE IF:   You develop a rash.   You have difficulty breathing.   You develop, or feel you are developing, any reaction or side effects to medicines. FOR MORE INFORMATION American Society for Bariatric Surgery: www.asbs.org Weight-control Information Network (WIN): win.StageSync.si   This information is not intended to replace advice given to you by your health care provider. Make sure you discuss any questions you have with your health care provider.   Document Released: 01/21/2005 Document Revised: 02/11/2014 Document Reviewed: 07/22/2012 Elsevier Interactive Patient Education [employer]

## 2015-08-02 NOTE — Assessment & Plan Note (Addendum)
Hgb A1C is 8.1 which is improved from the previous of 9.6.  She is not taking Bydureon but no other GLP 1's are reimbursed.  Yeast infection with Invokanna.  She will try to stop [employer].

## 2015-08-02 NOTE — Assessment & Plan Note (Signed)
BP is stable.  Usually lower at home

## 2015-08-02 NOTE — Assessment & Plan Note (Signed)
Stable, continue present medications.   

## 2015-08-02 NOTE — Assessment & Plan Note (Addendum)
Discussed weight loss surgery.  Consider weight loss surgery.  She admits that her mind not right for it.  She will think about it.

## 2015-08-02 NOTE — Progress Notes (Signed)
BP 142/96 mmHg  Pulse 78  Temp(Src) 98.5 F (36.9 C)  Ht 5' 8.1" (1.73 m)  Wt 380 lb 12.8 oz (172.73 kg)  BMI 57.71 kg/m2  SpO2 93%   Subjective:    Patient ID: Sara Zhang, female    DOB: February 15, 1970, 45 y.o.   MRN: [id number]  HPI: Sara Zhang is a 45 y.o. female  Chief Complaint  Patient presents with  . Medicare Wellness   Functional Status Survey: Is the patient deaf or have difficulty hearing?: No Does the patient have difficulty seeing, even when wearing glasses/contacts?: Yes Does the patient have difficulty concentrating, remembering, or making decisions?: Yes (remembering) Does the patient have difficulty walking or climbing stairs?: Yes Does the patient have difficulty dressing or bathing?: Yes Does the patient have difficulty doing errands alone such as visiting a doctor's office or shopping?: No  Fall Risk  08/02/2015  Falls in the past year? No   Depression screen [facility] 2/9 08/02/2015 04/25/2015  Decreased Interest 1 0  Down, Depressed, Hopeless 0 0  PHQ - 2 Score 1 0    Social History   Social History  . Marital Status: Single    Spouse Name: N/A  . Number of Children: N/A  . Years of Education: N/A   Social History Main Topics  . Smoking status: Never Smoker   . Smokeless tobacco: Former Neurosurgeon  . Alcohol Use: 0.0 oz/week    0 Standard drinks or equivalent per week     Comment: on occasion  . Drug Use: No  . Sexual Activity: Yes   Other Topics Concern  . None   Social History Narrative   Family History  Problem Relation Age of Onset  . Diabetes Mother   . Hyperlipidemia Mother   . Hypertension Mother   . Arthritis Father   . Asthma Father   . Diabetes Father   . Hypertension Father   . Hypertension Brother   . Diabetes Brother   . Diabetes Maternal Grandmother   . Hypertension Maternal Grandmother   . Diabetes Maternal Grandfather   . Hypertension Maternal Grandfather   . Diabetes Paternal Grandmother   . Hypertension  Paternal Grandmother    Past Medical History  Diagnosis Date  . Hyperlipidemia   . Sleep apnea   . Hypoxia   . Extreme obesity (HCC)   . Chronic kidney disease   . Hypertension   . Diabetes mellitus without complication (HCC)   . Vitamin D deficiency   . Left heart failure [facility])    Past Surgical History  Procedure Laterality Date  . Cesarean section    . Tonsillectomy and adenoidectomy    . Cholecystectomy     Diabetes: Using medications without difficulties.  Pt is taking 50 units of long-acting insulin.  Taking Glipizide.  She is not taking the Bydureon due to the large needle which hurts.  Insurance won't cover Trulicity No hypoglycemic episodes No hyperglycemic episodes sugars continue to be high and in the 200's in the AM.  Admits to drinking a lot of mountain dew.  Admits to 3 20 oz bottles   Feet problems:none eye exam within last year Last Hgb A1C: 9.9  Hypertension  Using medications without difficulty Average home BPs pretty good  Using medication without problems or lightheadedness No chest pain with exertion or shortness of breath No Edema  Elevated Cholesterol Using medications without problems No Muscle aches  Exercise: not exercising.    Mini cog is normal  Relevant past medical, surgical, family and social history reviewed and updated as indicated. Interim medical history since our last visit reviewed. Allergies and medications reviewed and updated.  Review of Systems  Per HPI unless specifically indicated above     Objective:    BP 142/96 mmHg  Pulse 78  Temp(Src) 98.5 F (36.9 C)  Ht 5' 8.1" (1.73 m)  Wt 380 lb 12.8 oz (172.73 kg)  BMI 57.71 kg/m2  SpO2 93%  Wt Readings from Last 3 Encounters:  08/02/15 380 lb 12.8 oz (172.73 kg)  04/25/15 375 lb 9.6 oz (170.371 kg)  06/17/14 390 lb (176.903 kg)    Physical Exam  Constitutional:  Obese.  Using O2  HENT:  Head: Normocephalic.  Neck: Normal range of motion.  Cardiovascular:  Normal rate and regular rhythm.   Pulmonary/Chest: Effort normal. She has no decreased breath sounds.  Musculoskeletal: Normal range of motion.  Neurological: She is alert.  Skin: Skin is warm and dry.  Psychiatric: She has a normal mood and affect. Her behavior is normal.   Diabetic Foot Exam - Simple   Simple Foot Form  Diabetic Foot exam was performed with the following findings:  Yes 08/02/2015  8:44 AM  Visual Inspection  No deformities, no ulcerations, no other skin breakdown bilaterally:  Yes  Sensation Testing  Intact to touch and monofilament testing bilaterally:  Yes  Pulse Check  Posterior Tibialis and Dorsalis pulse intact bilaterally:  Yes  Comments          Assessment & Plan:   Problem List Items Addressed This Visit      Unprioritized   CKD (chronic kidney disease), stage II   Relevant Orders   CBC with Differential/Platelet   Extreme obesity (HCC)    Discussed weight loss surgery.  Consider weight loss surgery.  She admits that her mind not right for it.  She will think about it.        Hyperlipidemia    Stable, continue present medications.        Relevant Orders   Lipid Panel Piccolo, Waived   Hypertensive heart disease with congestive heart failure (HCC)    BP is stable.  Usually lower at home      Type 2 diabetes mellitus with chronic kidney disease (HCC) - Primary    Hgb A1C is 8.1 which is improved from the previous of 9.6.  She is not taking Bydureon but no other GLP 1's are reimbursed.  Yeast infection with Invokanna.  She will try to stop [employer].        Relevant Orders   Comprehensive metabolic panel   Bayer DCA Hb Z6X Waived    Other Visit Diagnoses    Annual physical exam            Follow up plan: Return in about 3 months (around 11/02/2015).

## 2015-08-03 LAB — COMPREHENSIVE METABOLIC PANEL
A/G RATIO: 1 — AB (ref 1.2–2.2)
ALBUMIN: 3.4 g/dL — AB (ref 3.5–5.5)
ALK PHOS: 90 IU/L (ref 39–117)
ALT: 9 IU/L (ref 0–32)
AST: 10 IU/L (ref 0–40)
BILIRUBIN TOTAL: 0.3 mg/dL (ref 0.0–1.2)
BUN / CREAT RATIO: 17 (ref 9–23)
BUN: 13 mg/dL (ref 6–24)
CHLORIDE: 99 mmol/L (ref 96–106)
CO2: 27 mmol/L (ref 18–29)
Calcium: 8.6 mg/dL — ABNORMAL LOW (ref 8.7–10.2)
Creatinine, Ser: 0.77 mg/dL (ref 0.57–1.00)
GFR calc non Af Amer: 93 mL/min/{1.73_m2} (ref >59)
GFR, EST AFRICAN AMERICAN: 107 mL/min/{1.73_m2} (ref >59)
GLOBULIN, TOTAL: 3.5 g/dL (ref 1.5–4.5)
GLUCOSE: 137 mg/dL — AB (ref 65–99)
POTASSIUM: 4.9 mmol/L (ref 3.5–5.2)
SODIUM: 141 mmol/L (ref 134–144)
TOTAL PROTEIN: 6.9 g/dL (ref 6.0–8.5)

## 2015-08-03 LAB — CBC WITH DIFFERENTIAL/PLATELET
BASOS ABS: 0 10*3/uL (ref 0.0–0.2)
Basos: 0 %
EOS (ABSOLUTE): 0.3 10*3/uL (ref 0.0–0.4)
Eos: 4 %
HEMOGLOBIN: 10.8 g/dL — AB (ref 11.1–15.9)
Hematocrit: 36.4 % (ref 34.0–46.6)
Immature Grans (Abs): 0 10*3/uL (ref 0.0–0.1)
Immature Granulocytes: 0 %
LYMPHS ABS: 2.1 10*3/uL (ref 0.7–3.1)
Lymphs: 31 %
MCH: 24.6 pg — AB (ref 26.6–33.0)
MCHC: 29.7 g/dL — AB (ref 31.5–35.7)
MCV: 83 fL (ref 79–97)
MONOCYTES: 8 %
MONOS ABS: 0.6 10*3/uL (ref 0.1–0.9)
NEUTROS ABS: 3.9 10*3/uL (ref 1.4–7.0)
Neutrophils: 57 %
Platelets: 334 10*3/uL (ref 150–379)
RBC: 4.39 x10E6/uL (ref 3.77–5.28)
RDW: 14.7 % (ref 12.3–15.4)
WBC: 6.9 10*3/uL (ref 3.4–10.8)

## 2015-09-19 ENCOUNTER — Other Ambulatory Visit: Payer: Self-pay | Admitting: Unknown Physician Specialty

## 2015-10-25 ENCOUNTER — Other Ambulatory Visit: Payer: Self-pay | Admitting: Unknown Physician Specialty

## 2015-11-01 ENCOUNTER — Ambulatory Visit: Payer: Medicare Other | Admitting: Unknown Physician Specialty

## 2015-11-03 ENCOUNTER — Ambulatory Visit (INDEPENDENT_AMBULATORY_CARE_PROVIDER_SITE_OTHER): Payer: Medicare Other | Admitting: Unknown Physician Specialty

## 2015-11-03 ENCOUNTER — Encounter: Payer: Self-pay | Admitting: Unknown Physician Specialty

## 2015-11-03 VITALS — BP 122/79 | HR 93 | Temp 98.2°F | Ht 68.9 in | Wt 379.2 lb

## 2015-11-03 DIAGNOSIS — Z794 Long term (current) use of insulin: Secondary | ICD-10-CM | POA: Diagnosis not present

## 2015-11-03 DIAGNOSIS — E1122 Type 2 diabetes mellitus with diabetic chronic kidney disease: Secondary | ICD-10-CM | POA: Diagnosis not present

## 2015-11-03 DIAGNOSIS — N182 Chronic kidney disease, stage 2 (mild): Secondary | ICD-10-CM

## 2015-11-03 DIAGNOSIS — J069 Acute upper respiratory infection, unspecified: Secondary | ICD-10-CM

## 2015-11-03 LAB — BAYER DCA HB A1C WAIVED: HB A1C (BAYER DCA - WAIVED): 8.5 % — ABNORMAL HIGH (ref <7.0)

## 2015-11-03 MED ORDER — AZITHROMYCIN 250 MG PO TABS: ORAL_TABLET | ORAL | 0 refills | Status: DC

## 2015-11-03 MED ORDER — HYDROCOD POLST-CPM POLST ER 10-8 MG/5ML PO SUER: 5.0000 mL | Freq: Two times a day (BID) | ORAL | 0 refills | Status: DC | PRN

## 2015-11-03 NOTE — Progress Notes (Signed)
BP 122/79 (BP Location: Left Arm, Patient Position: Sitting, Cuff Size: Large)   Pulse 93   Temp 98.2 F (36.8 C)   Ht 5' 8.9" (1.75 m)   Wt (!) 379 lb 3.2 oz (172 kg)   SpO2 93%   BMI 56.16 kg/m    Subjective:    Patient ID: Sara Zhang, female    DOB: 1970-05-21, 45 y.o.   MRN: [id number]  HPI: Sara Zhang is a 45 y.o. female  Chief Complaint  Patient presents with  . Diabetes  . Hyperlipidemia  . Hypertension  . URI    pt states she has a cough, chest congestion, and phlegm. States symptoms started Tuesday.    Diabetes Using medications without difficulties.  Pt is taking 50 units of long-acting insulin.  Taking Glipizide.  She is not taking the Bydureon due to the large needle which hurts.  Financial trader.  Invokana causes yeast infection No hypoglycemic episodes No hyperglycemic episodes sugars continue to be high and in the 200's in the AM.  Admits to drinking a lot of mountain dew.  Admits to 3 20 oz bottles   Feet problems:none eye exam within last year Last Hgb A1C: 8.1  URI   This is a new problem. The current episode started in the past 7 days (for 3 days). The problem has been unchanged. There has been no fever. Associated symptoms include congestion, coughing, rhinorrhea, sneezing, a sore throat and wheezing. Pertinent negatives include no abdominal pain or chest pain. She has tried nothing for the symptoms.     Relevant past medical, surgical, family and social history reviewed and updated as indicated. Interim medical history since our last visit reviewed. Allergies and medications reviewed and updated.  Review of Systems  HENT: Positive for congestion, rhinorrhea, sneezing and sore throat.   Respiratory: Positive for cough and wheezing.   Cardiovascular: Negative for chest pain.  Gastrointestinal: Negative for abdominal pain.    Per HPI unless specifically indicated above     Objective:    BP 122/79 (BP Location: Left  Arm, Patient Position: Sitting, Cuff Size: Large)   Pulse 93   Temp 98.2 F (36.8 C)   Ht 5' 8.9" (1.75 m)   Wt (!) 379 lb 3.2 oz (172 kg)   SpO2 93%   BMI 56.16 kg/m   Wt Readings from Last 3 Encounters:  11/03/15 (!) 379 lb 3.2 oz (172 kg)  08/02/15 (!) 380 lb 12.8 oz (172.7 kg)  04/25/15 (!) 375 lb 9.6 oz (170.4 kg)    Physical Exam  Constitutional: She is oriented to person, place, and time. She appears well-developed and well-nourished. No distress.  HENT:  Head: Normocephalic and atraumatic.  Eyes: Conjunctivae and lids are normal. Right eye exhibits no discharge. Left eye exhibits no discharge. No scleral icterus.  Neck: Normal range of motion. Neck supple. No JVD present. Carotid bruit is not present.  Cardiovascular: Normal rate, regular rhythm and normal heart sounds.   Pulmonary/Chest: Effort normal and breath sounds normal.  Abdominal: Normal appearance. There is no splenomegaly or hepatomegaly.  Musculoskeletal: Normal range of motion.  Neurological: She is alert and oriented to person, place, and time.  Skin: Skin is warm, dry and intact. No rash noted. No pallor.  Psychiatric: She has a normal mood and affect. Her behavior is normal. Judgment and thought content normal.    Results for orders placed or performed in visit on 08/02/15  Comprehensive metabolic panel  Result  Value Ref Range   Glucose 137 (H) 65 - 99 mg/dL   BUN 13 6 - 24 mg/dL   Creatinine, Ser 4.090.77 0.57 - 1.00 mg/dL   GFR calc non Af Amer 93 >59 mL/min/1.73   GFR calc Af Amer 107 >59 mL/min/1.73   BUN/Creatinine Ratio 17 9 - 23   Sodium 141 134 - 144 mmol/L   Potassium 4.9 3.5 - 5.2 mmol/L   Chloride 99 96 - 106 mmol/L   CO2 27 18 - 29 mmol/L   Calcium 8.6 (L) 8.7 - 10.2 mg/dL   Total Protein 6.9 6.0 - 8.5 g/dL   Albumin 3.4 (L) 3.5 - 5.5 g/dL   Globulin, Total 3.5 1.5 - 4.5 g/dL   Albumin/Globulin Ratio 1.0 (L) 1.2 - 2.2   Bilirubin Total 0.3 0.0 - 1.2 mg/dL   Alkaline Phosphatase 90 39 -  117 IU/L   AST 10 0 - 40 IU/L   ALT 9 0 - 32 IU/L  Lipid Panel Piccolo, Waived  Result Value Ref Range   Cholesterol Piccolo, Waived 137 <200 mg/dL   HDL Chol Piccolo, Waived 43 (L) >59 mg/dL   Triglycerides Piccolo,Waived 80 <150 mg/dL   Chol/HDL Ratio Piccolo,Waive 3.2 mg/dL   LDL Chol Calc Piccolo Waived 78 <100 mg/dL   VLDL Chol Calc Piccolo,Waive 16 <30 mg/dL  Bayer DCA Hb W1XA1c Waived  Result Value Ref Range   Bayer DCA Hb A1c Waived 8.1 (H) <7.0 %  CBC with Differential/Platelet  Result Value Ref Range   WBC 6.9 3.4 - 10.8 x10E3/uL   RBC 4.39 3.77 - 5.28 x10E6/uL   Hemoglobin 10.8 (L) 11.1 - 15.9 g/dL   Hematocrit 91.436.4 78.234.0 - 46.6 %   MCV 83 79 - 97 fL   MCH 24.6 (L) 26.6 - 33.0 pg   MCHC 29.7 (L) 31.5 - 35.7 g/dL   RDW 95.614.7 21.312.3 - 08.615.4 %   Platelets 334 150 - 379 x10E3/uL   Neutrophils 57 %   Lymphs 31 %   Monocytes 8 %   Eos 4 %   Basos 0 %   Neutrophils Absolute 3.9 1.4 - 7.0 x10E3/uL   Lymphocytes Absolute 2.1 0.7 - 3.1 x10E3/uL   Monocytes Absolute 0.6 0.1 - 0.9 x10E3/uL   EOS (ABSOLUTE) 0.3 0.0 - 0.4 x10E3/uL   Basophils Absolute 0.0 0.0 - 0.2 x10E3/uL   Immature Granulocytes 0 %   Immature Grans (Abs) 0.0 0.0 - 0.1 x10E3/uL      Assessment & Plan:   Problem List Items Addressed This Visit      Unprioritized   Type 2 diabetes mellitus with chronic kidney disease (HCC) - Primary    Hgb A1C is 8.5.  Sometimes does not take insulin.  Stressed compliance.  Bydureon hurt too much and other GLP1s not on formulary.  Invokana gave yeast infection      Relevant Orders   Comprehensive metabolic panel   Bayer DCA Hb V7QA1c Waived    Other Visit Diagnoses    URI (upper respiratory infection)       Relevant Medications   azithromycin (ZITHROMAX) 250 MG tablet      Z pack given as pt with chronic illness and on oxygen  Follow up plan: Return in about 3 months (around 02/02/2016).

## 2015-11-03 NOTE — Assessment & Plan Note (Signed)
Hgb A1C is 8.5.  Sometimes does not take insulin.  Stressed compliance.  Bydureon hurt too much and other GLP1s not on formulary.  Invokana gave yeast infection

## 2015-11-04 LAB — COMPREHENSIVE METABOLIC PANEL
A/G RATIO: 1 — AB (ref 1.2–2.2)
ALBUMIN: 3.5 g/dL (ref 3.5–5.5)
ALT: 14 IU/L (ref 0–32)
AST: 9 IU/L (ref 0–40)
Alkaline Phosphatase: 109 IU/L (ref 39–117)
BILIRUBIN TOTAL: 0.4 mg/dL (ref 0.0–1.2)
BUN / CREAT RATIO: 19 (ref 9–23)
BUN: 17 mg/dL (ref 6–24)
CALCIUM: 8.4 mg/dL — AB (ref 8.7–10.2)
CHLORIDE: 98 mmol/L (ref 96–106)
CO2: 30 mmol/L — ABNORMAL HIGH (ref 18–29)
Creatinine, Ser: 0.91 mg/dL (ref 0.57–1.00)
GFR, EST AFRICAN AMERICAN: 88 mL/min/{1.73_m2} (ref >59)
GFR, EST NON AFRICAN AMERICAN: 76 mL/min/{1.73_m2} (ref >59)
GLUCOSE: 92 mg/dL (ref 65–99)
Globulin, Total: 3.6 g/dL (ref 1.5–4.5)
Potassium: 4.9 mmol/L (ref 3.5–5.2)
Sodium: 141 mmol/L (ref 134–144)
TOTAL PROTEIN: 7.1 g/dL (ref 6.0–8.5)

## 2015-11-06 ENCOUNTER — Encounter: Payer: Self-pay | Admitting: Unknown Physician Specialty

## 2015-11-06 NOTE — Progress Notes (Signed)
Normal labs.  Patient notified by letter.

## 2015-11-20 ENCOUNTER — Emergency Department
Admission: EM | Admit: 2015-11-20 | Discharge: 2015-11-20 | Disposition: A | Discharge status: Home / Self Care | Payer: Medicare Other | Attending: Emergency Medicine | Admitting: Emergency Medicine

## 2015-11-20 ENCOUNTER — Encounter: Payer: Self-pay | Admitting: Emergency Medicine

## 2015-11-20 DIAGNOSIS — Z794 Long term (current) use of insulin: Secondary | ICD-10-CM | POA: Insufficient documentation

## 2015-11-20 DIAGNOSIS — K0889 Other specified disorders of teeth and supporting structures: Secondary | ICD-10-CM | POA: Diagnosis present

## 2015-11-20 DIAGNOSIS — N182 Chronic kidney disease, stage 2 (mild): Secondary | ICD-10-CM | POA: Diagnosis not present

## 2015-11-20 DIAGNOSIS — E1122 Type 2 diabetes mellitus with diabetic chronic kidney disease: Secondary | ICD-10-CM | POA: Diagnosis not present

## 2015-11-20 DIAGNOSIS — Z87891 Personal history of nicotine dependence: Secondary | ICD-10-CM | POA: Insufficient documentation

## 2015-11-20 DIAGNOSIS — I509 Heart failure, unspecified: Secondary | ICD-10-CM | POA: Diagnosis not present

## 2015-11-20 DIAGNOSIS — I130 Hypertensive heart and chronic kidney disease with heart failure and stage 1 through stage 4 chronic kidney disease, or unspecified chronic kidney disease: Secondary | ICD-10-CM | POA: Diagnosis not present

## 2015-11-20 DIAGNOSIS — Z79899 Other long term (current) drug therapy: Secondary | ICD-10-CM | POA: Insufficient documentation

## 2015-11-20 DIAGNOSIS — K029 Dental caries, unspecified: Secondary | ICD-10-CM | POA: Insufficient documentation

## 2015-11-20 DIAGNOSIS — K047 Periapical abscess without sinus: Secondary | ICD-10-CM | POA: Diagnosis not present

## 2015-11-20 MED ORDER — AMOXICILLIN 875 MG PO TABS: 875.0000 mg | ORAL_TABLET | Freq: Two times a day (BID) | ORAL | 0 refills | Status: DC

## 2015-11-20 MED ORDER — MAGIC MOUTHWASH W/LIDOCAINE: 5.0000 mL | Freq: Four times a day (QID) | ORAL | 0 refills | Status: DC

## 2015-11-20 NOTE — ED Triage Notes (Signed)
Patient presents to the ED with right sided dental pain.  Patient is holding pressure to mouth for comfort.  Patient states it is painful to speak.  Patient states she called her dentist but can't get an appointment until Wednesday.

## 2015-11-20 NOTE — ED Provider Notes (Signed)
[facility] Emergency Department Provider Note  ____________________________________________  Time seen: Approximately 3:41 PM  I have reviewed the triage vital signs and the nursing notes.   HISTORY  Chief Complaint Dental Pain    HPI Sara Zhang is a 45 y.o. female who presents emergency department complaining of right lower dental pain. Patient states that she has "bad tooth" to the right lower dentition. Patient states that she's had increasing pain over the last 2 weeks. Today the family worse. Patient states that she called her dentist but is unable to receive 2 days. She denies any fevers or chills, difficulty swallowing. She has not tried any medications   Past Medical History:  Diagnosis Date  . Chronic kidney disease   . Diabetes mellitus without complication ([place])   . Extreme obesity ([place])   . Hyperlipidemia   . Hypertension   . Hypoxia   . Left heart failure ([place])   . Sleep apnea   . Vitamin D deficiency     Patient Active Problem List   Diagnosis Date Noted  . Type 2 diabetes mellitus with chronic kidney disease ([place]) 04/25/2015  . Hypocalcemia 11/10/2014  . Hyperlipidemia 11/10/2014  . Sleep apnea 11/10/2014  . Hypoxia 11/10/2014  . Extreme obesity ([place]) 11/10/2014  . Hypertensive CKD (chronic kidney disease) 11/10/2014  . Diabetes ([place]) 11/10/2014  . Vitamin D deficiency 11/10/2014  . CKD (chronic kidney disease), stage II 11/10/2014  . Left heart failure ([place]) 11/10/2014  . Hypertensive heart disease with congestive heart failure ([place]) 11/10/2014    Past Surgical History:  Procedure Laterality Date  . CESAREAN SECTION    . CHOLECYSTECTOMY    . TONSILLECTOMY AND ADENOIDECTOMY      Prior to Admission medications   Medication Sig Start Date End Date Taking? Authorizing Provider  amoxicillin (AMOXIL) 875 MG tablet Take 1 tablet (875 mg total) by mouth 2 (two) times daily. 11/20/15   Charline Bills Zeno Hickel, PA-C  atenolol  (TENORMIN) 25 MG tablet take 1 tablet by mouth once daily 09/19/15   Kathrine Haddock, NP  atorvastatin (LIPITOR) 10 MG tablet take 1 tablet by mouth once daily 10/25/15   Guadalupe Maple, MD  azithromycin (ZITHROMAX) 250 MG tablet As directed 11/03/15   Kathrine Haddock, NP  blood glucose meter kit and supplies KIT Dispense based on patient and insurance preference. Use up to four times daily as directed. (FOR ICD-9 250.00, 250.01). 04/25/15   Kathrine Haddock, NP  chlorpheniramine-HYDROcodone [facility] ER) 10-8 MG/5ML SUER Take 5 mLs by mouth every 12 (twelve) hours as needed for cough. 11/03/15   Kathrine Haddock, NP  furosemide (LASIX) 40 MG tablet take 1 tablet by mouth once daily 04/10/15   Kathrine Haddock, NP  glipiZIDE (GLUCOTROL) 10 MG tablet Take 1 tablet (10 mg total) by mouth daily before breakfast. 04/26/15   Kathrine Haddock, NP  Insulin Glargine (TOUJEO SOLOSTAR) 300 UNIT/ML SOPN Inject 50 Units into the skin at bedtime. 06/12/15   Kathrine Haddock, NP  lisinopril (PRINIVIL,ZESTRIL) 5 MG tablet take 1 tablet by mouth once daily 04/10/15   Kathrine Haddock, NP  magic mouthwash w/lidocaine SOLN Take 5 mLs by mouth 4 (four) times daily. 11/20/15   Charline Bills Jauan Wohl, PA-C  metFORMIN (GLUCOPHAGE) 1000 MG tablet Take 1,000 mg by mouth 2 (two) times daily with a meal. Reported on 08/02/2015    Historical Provider, MD  TANZEUM 30 MG PEN inject 1 PRE-FILLED SYRINGE WEEKLY subcutaneously Patient not taking: Reported on 11/03/2015 07/14/15  Kathrine Haddock, NP    Allergies Review of patient's allergies indicates no known allergies.  Family History  Problem Relation Age of Onset  . Diabetes Mother   . Hyperlipidemia Mother   . Hypertension Mother   . Arthritis Father   . Asthma Father   . Diabetes Father   . Hypertension Father   . Hypertension Brother   . Diabetes Brother   . Diabetes Maternal Grandmother   . Hypertension Maternal Grandmother   . Diabetes Maternal Grandfather   . Hypertension Maternal  Grandfather   . Diabetes Paternal Grandmother   . Hypertension Paternal Grandmother     Social History Social History  Substance Use Topics  . Smoking status: Never Smoker  . Smokeless tobacco: Former Systems developer  . Alcohol use 0.0 oz/week     Comment: on occasion     Review of Systems  Constitutional: No fever/chills Eyes: No visual changes. No discharge ENT: Positive for right lower dental pain Cardiovascular: no chest pain. Respiratory: no cough. No SOB. Musculoskeletal: Negative for musculoskeletal pain. Skin: Negative for rash, abrasions, lacerations, ecchymosis. Neurological: Negative for headaches, focal weakness or numbness. 10-point ROS otherwise negative.  ____________________________________________   PHYSICAL EXAM:  VITAL SIGNS: ED Triage Vitals  Enc Vitals Group     BP 11/20/15 1503 (!) 161/94     Pulse Rate 11/20/15 1503 90     Resp 11/20/15 1503 18     Temp 11/20/15 1503 98.6 F (37 C)     Temp Source 11/20/15 1503 Oral     SpO2 11/20/15 1503 100 %     Weight 11/20/15 1501 (!) 390 lb (176.9 kg)     Height 11/20/15 1501 _0  (1.753 m)     Head Circumference --      Peak Flow --      Pain Score 11/20/15 1501 10     Pain Loc --      Pain Edu? --      Excl. in [place]? --      Constitutional: Alert and oriented. Well appearing and in no acute distress. Eyes: Conjunctivae are normal. PERRL. EOMI. Head: Atraumatic. ENT:      Ears:       Nose: No congestion/rhinnorhea.      Mouth/Throat: Mucous membranes are moist. Multiple caries are noted throughout dentition. Several teeth erosion into the pulp. Multiple teeth on right lower dentition with mild surrounding signs of infection with erythema. No drainage. No fluctuance with tongue depressor. Neck: No stridor.   Hematological/Lymphatic/Immunilogical: No cervical lymphadenopathy. Cardiovascular: Normal rate, regular rhythm. Normal S1 and S2.  Good peripheral circulation. Respiratory: Normal respiratory effort  without tachypnea or retractions. Lungs CTAB. Good air entry to the bases with no decreased or absent breath sounds. Musculoskeletal: Full range of motion to all extremities. No gross deformities appreciated. Neurologic:  Normal speech and language. No gross focal neurologic deficits are appreciated.  Skin:  Skin is warm, dry and intact. No rash noted. Psychiatric: Mood and affect are normal. Speech and behavior are normal. Patient exhibits appropriate insight and judgement.   ____________________________________________   LABS (all labs ordered are listed, but only abnormal results are displayed)  Labs Reviewed - No data to display ____________________________________________  EKG   ____________________________________________  RADIOLOGY   No results found.  ____________________________________________    PROCEDURES  Procedure(s) performed:    Procedures    Medications - No data to display   ____________________________________________   INITIAL IMPRESSION / ASSESSMENT AND PLAN / ED COURSE  Pertinent labs & imaging results that were available during my care of the patient were reviewed by me and considered in my medical decision making (see chart for details).  Review of the [place] CSRS was performed in accordance of the [place] prior to dispensing any controlled drugs.  Clinical Course    Patient's diagnosis is consistent with A lower dental infection. No signs abscess requiring incision and drainage at this time.. Patient will be discharged home with prescriptions for antiobiotics and magic mouthwash. Patient is to follow up with dentist as needed or otherwise directed. Patient is given ED precautions to return to the ED for any worsening or new symptoms.     ____________________________________________  FINAL CLINICAL IMPRESSION(S) / ED DIAGNOSES  Final diagnoses:  Dental infection  Dental caries      NEW MEDICATIONS STARTED DURING THIS VISIT:  New  Prescriptions   AMOXICILLIN (AMOXIL) 875 MG TABLET    Take 1 tablet (875 mg total) by mouth 2 (two) times daily.   MAGIC MOUTHWASH W/LIDOCAINE SOLN    Take 5 mLs by mouth 4 (four) times daily.        This chart was dictated using voice recognition software/Dragon. Despite best efforts to proofread, errors can occur which can change the meaning. Any change was purely unintentional.    Darletta Moll, PA-C 11/20/15 1546    Delman Kitten, MD 11/21/15 [phone]

## 2015-11-24 ENCOUNTER — Other Ambulatory Visit: Payer: Self-pay | Admitting: Unknown Physician Specialty

## 2015-11-24 NOTE — Telephone Encounter (Signed)
Your patient 

## 2015-12-16 ENCOUNTER — Emergency Department: Payer: Medicare Other

## 2015-12-16 ENCOUNTER — Emergency Department
Admission: EM | Admit: 2015-12-16 | Discharge: 2015-12-16 | Disposition: A | Discharge status: Home / Self Care | Payer: Medicare Other | Attending: Emergency Medicine | Admitting: Emergency Medicine

## 2015-12-16 ENCOUNTER — Encounter: Payer: Self-pay | Admitting: Emergency Medicine

## 2015-12-16 DIAGNOSIS — E1122 Type 2 diabetes mellitus with diabetic chronic kidney disease: Secondary | ICD-10-CM | POA: Diagnosis not present

## 2015-12-16 DIAGNOSIS — N182 Chronic kidney disease, stage 2 (mild): Secondary | ICD-10-CM | POA: Diagnosis not present

## 2015-12-16 DIAGNOSIS — N390 Urinary tract infection, site not specified: Secondary | ICD-10-CM | POA: Diagnosis not present

## 2015-12-16 DIAGNOSIS — I130 Hypertensive heart and chronic kidney disease with heart failure and stage 1 through stage 4 chronic kidney disease, or unspecified chronic kidney disease: Secondary | ICD-10-CM | POA: Diagnosis not present

## 2015-12-16 DIAGNOSIS — Z87891 Personal history of nicotine dependence: Secondary | ICD-10-CM | POA: Insufficient documentation

## 2015-12-16 DIAGNOSIS — Z79899 Other long term (current) drug therapy: Secondary | ICD-10-CM | POA: Diagnosis not present

## 2015-12-16 DIAGNOSIS — I509 Heart failure, unspecified: Secondary | ICD-10-CM | POA: Insufficient documentation

## 2015-12-16 DIAGNOSIS — Z794 Long term (current) use of insulin: Secondary | ICD-10-CM | POA: Insufficient documentation

## 2015-12-16 DIAGNOSIS — I110 Hypertensive heart disease with heart failure: Secondary | ICD-10-CM | POA: Diagnosis not present

## 2015-12-16 DIAGNOSIS — R05 Cough: Secondary | ICD-10-CM | POA: Diagnosis not present

## 2015-12-16 DIAGNOSIS — R0602 Shortness of breath: Secondary | ICD-10-CM | POA: Diagnosis not present

## 2015-12-16 LAB — COMPREHENSIVE METABOLIC PANEL
ALK PHOS: 78 U/L (ref 38–126)
ALT: 12 U/L — ABNORMAL LOW (ref 14–54)
ANION GAP: 7 (ref 5–15)
AST: 21 U/L (ref 15–41)
Albumin: 3.1 g/dL — ABNORMAL LOW (ref 3.5–5.0)
BILIRUBIN TOTAL: 0.3 mg/dL (ref 0.3–1.2)
BUN: 12 mg/dL (ref 6–20)
CALCIUM: 8.3 mg/dL — AB (ref 8.9–10.3)
CO2: 30 mmol/L (ref 22–32)
Chloride: 102 mmol/L (ref 101–111)
Creatinine, Ser: 0.78 mg/dL (ref 0.44–1.00)
Glucose, Bld: 113 mg/dL — ABNORMAL HIGH (ref 65–99)
POTASSIUM: 4.1 mmol/L (ref 3.5–5.1)
Sodium: 139 mmol/L (ref 135–145)
TOTAL PROTEIN: 7.7 g/dL (ref 6.5–8.1)

## 2015-12-16 LAB — URINALYSIS COMPLETE WITH MICROSCOPIC (ARMC ONLY)
BILIRUBIN URINE: NEGATIVE
Glucose, UA: NEGATIVE mg/dL
KETONES UR: NEGATIVE mg/dL
Leukocytes, UA: NEGATIVE
NITRITE: NEGATIVE
PH: 5 (ref 5.0–8.0)
Specific Gravity, Urine: 1.03 (ref 1.005–1.030)

## 2015-12-16 LAB — BRAIN NATRIURETIC PEPTIDE: B Natriuretic Peptide: 51 pg/mL (ref 0.0–100.0)

## 2015-12-16 LAB — CBC WITH DIFFERENTIAL/PLATELET
BASOS PCT: 1 %
Basophils Absolute: 0.1 10*3/uL (ref 0–0.1)
Eosinophils Absolute: 0.1 10*3/uL (ref 0–0.7)
Eosinophils Relative: 1 %
HEMATOCRIT: 33.7 % — AB (ref 35.0–47.0)
Hemoglobin: 10.6 g/dL — ABNORMAL LOW (ref 12.0–16.0)
LYMPHS ABS: 1.8 10*3/uL (ref 1.0–3.6)
LYMPHS PCT: 24 %
MCH: 24.6 pg — AB (ref 26.0–34.0)
MCHC: 31.6 g/dL — AB (ref 32.0–36.0)
MCV: 77.9 fL — ABNORMAL LOW (ref 80.0–100.0)
MONO ABS: 0.6 10*3/uL (ref 0.2–0.9)
MONOS PCT: 8 %
NEUTROS ABS: 4.9 10*3/uL (ref 1.4–6.5)
Neutrophils Relative %: 66 %
Platelets: 346 10*3/uL (ref 150–440)
RBC: 4.32 MIL/uL (ref 3.80–5.20)
RDW: 15.1 % — AB (ref 11.5–14.5)
WBC: 7.5 10*3/uL (ref 3.6–11.0)

## 2015-12-16 LAB — LIPASE, BLOOD: Lipase: 14 U/L (ref 11–51)

## 2015-12-16 LAB — TROPONIN I

## 2015-12-16 MED ORDER — NITROFURANTOIN MONOHYD MACRO 100 MG PO CAPS
100.0000 mg | ORAL_CAPSULE | Freq: Once | ORAL | Status: AC
  Administered 2015-12-16: 100 mg via ORAL
  Filled 2015-12-16: qty 1

## 2015-12-16 MED ORDER — ONDANSETRON HCL 4 MG/2ML IJ SOLN
4.0000 mg | Freq: Once | INTRAMUSCULAR | Status: AC
Start: 2015-12-16 — End: 2015-12-16
  Administered 2015-12-16: 4 mg via INTRAVENOUS
  Filled 2015-12-16: qty 2

## 2015-12-16 MED ORDER — FUROSEMIDE 10 MG/ML IJ SOLN
40.0000 mg | Freq: Once | INTRAMUSCULAR | Status: AC
  Administered 2015-12-16: 40 mg via INTRAVENOUS
  Filled 2015-12-16: qty 4

## 2015-12-16 MED ORDER — MORPHINE SULFATE (PF) 4 MG/ML IV SOLN
4.0000 mg | Freq: Once | INTRAVENOUS | Status: AC
  Administered 2015-12-16: 4 mg via INTRAVENOUS
  Filled 2015-12-16: qty 1

## 2015-12-16 MED ORDER — NITROFURANTOIN MONOHYD MACRO 100 MG PO CAPS: 100.0000 mg | ORAL_CAPSULE | Freq: Two times a day (BID) | ORAL | 0 refills | Status: AC

## 2015-12-16 NOTE — ED Triage Notes (Signed)
States has had "bad cold" this week. Frequent cough. Denies sinus drainage. Afebrile with SAT 91 in triage. States uses 02 due to history of CHF. Fatigued.

## 2015-12-16 NOTE — ED Provider Notes (Signed)
[facility] Emergency Department Provider Note  ____________________________________________  Time seen: Approximately 11:33 AM  I have reviewed the triage vital signs and the nursing notes.   HISTORY  Chief Complaint Cough   HPI Sara Zhang is a 45 y.o. female with CHF, CKD, diabetes, morbidly obese, OSA on 2 L [place] who presents for evaluation of cough. Patient reports a week of URI symptoms and cough productive of clear sputum. She reports worsening orthopnea and has been sleeping on 4 pillows instead of 2. She also endorses mild shortness of breath when laying flat, no SOB with ambulation, no increased oxygen requirement, she endorses compliance with her medications. She is on 40 mg of Lasix daily. Patient denies fever or chills, chest pain. She reports that she has been coughing so much especially at night and for the last two days she has had diffuse abdominal cramping for 2 days that feels like a period. Patient is currently on her menstrual period. No nausea, vomiting, diarrhea, constipation, dysuria, hematuria. Patient is s/p cholecystectomy 10 years ago.  Past Medical History:  Diagnosis Date  . CHF (congestive heart failure) ([place])   . Chronic kidney disease   . Diabetes mellitus without complication ([place])   . Extreme obesity ([place])   . Hyperlipidemia   . Hypertension   . Hypoxia   . Left heart failure ([place])   . Sleep apnea   . Vitamin D deficiency     Patient Active Problem List   Diagnosis Date Noted  . Type 2 diabetes mellitus with chronic kidney disease ([place]) 04/25/2015  . Hypocalcemia 11/10/2014  . Hyperlipidemia 11/10/2014  . Sleep apnea 11/10/2014  . Hypoxia 11/10/2014  . Extreme obesity ([place]) 11/10/2014  . Hypertensive CKD (chronic kidney disease) 11/10/2014  . Diabetes ([place]) 11/10/2014  . Vitamin D deficiency 11/10/2014  . CKD (chronic kidney disease), stage II 11/10/2014  . Left heart failure ([place]) 11/10/2014  . Hypertensive  heart disease with congestive heart failure ([place]) 11/10/2014    Past Surgical History:  Procedure Laterality Date  . CESAREAN SECTION    . CHOLECYSTECTOMY    . TONSILLECTOMY AND ADENOIDECTOMY      Prior to Admission medications   Medication Sig Start Date End Date Taking? Authorizing Provider  amoxicillin (AMOXIL) 875 MG tablet Take 1 tablet (875 mg total) by mouth 2 (two) times daily. 11/20/15   Charline Bills Cuthriell, PA-C  atenolol (TENORMIN) 25 MG tablet take 1 tablet by mouth once daily 09/19/15   Kathrine Haddock, NP  atorvastatin (LIPITOR) 10 MG tablet take 1 tablet by mouth once daily 10/25/15   Guadalupe Maple, MD  azithromycin (ZITHROMAX) 250 MG tablet As directed 11/03/15   Kathrine Haddock, NP  blood glucose meter kit and supplies KIT Dispense based on patient and insurance preference. Use up to four times daily as directed. (FOR ICD-9 250.00, 250.01). 04/25/15   Kathrine Haddock, NP  chlorpheniramine-HYDROcodone [facility] ER) 10-8 MG/5ML SUER Take 5 mLs by mouth every 12 (twelve) hours as needed for cough. 11/03/15   Kathrine Haddock, NP  furosemide (LASIX) 40 MG tablet take 1 tablet by mouth once daily 04/10/15   Kathrine Haddock, NP  glipiZIDE (GLUCOTROL) 10 MG tablet Take 1 tablet (10 mg total) by mouth daily before breakfast. 04/26/15   Kathrine Haddock, NP  Insulin Glargine (TOUJEO SOLOSTAR) 300 UNIT/ML SOPN Inject 50 Units into the skin at bedtime. 06/12/15   Kathrine Haddock, NP  lisinopril (PRINIVIL,ZESTRIL) 5 MG tablet take 1 tablet by  mouth once daily 11/24/15   Kathrine Haddock, NP  magic mouthwash w/lidocaine SOLN Take 5 mLs by mouth 4 (four) times daily. 11/20/15   Charline Bills Cuthriell, PA-C  metFORMIN (GLUCOPHAGE) 1000 MG tablet Take 1,000 mg by mouth 2 (two) times daily with a meal. Reported on 08/02/2015    Historical Provider, MD  nitrofurantoin, macrocrystal-monohydrate, (MACROBID) 100 MG capsule Take 1 capsule (100 mg total) by mouth 2 (two) times daily. 12/16/15 12/21/15  Rudene Re, MD  TANZEUM 30 MG PEN inject 1 PRE-FILLED SYRINGE WEEKLY subcutaneously Patient not taking: Reported on 11/03/2015 07/14/15   Kathrine Haddock, NP    Allergies Patient has no known allergies.  Family History  Problem Relation Age of Onset  . Diabetes Mother   . Hyperlipidemia Mother   . Hypertension Mother   . Arthritis Father   . Asthma Father   . Diabetes Father   . Hypertension Father   . Hypertension Brother   . Diabetes Brother   . Diabetes Maternal Grandmother   . Hypertension Maternal Grandmother   . Diabetes Maternal Grandfather   . Hypertension Maternal Grandfather   . Diabetes Paternal Grandmother   . Hypertension Paternal Grandmother     Social History Social History  Substance Use Topics  . Smoking status: Never Smoker  . Smokeless tobacco: Former Systems developer  . Alcohol use 0.0 oz/week     Comment: on occasion    Review of Systems  Constitutional: Negative for fever. Eyes: Negative for visual changes. ENT: Negative for sore throat. Cardiovascular: Negative for chest pain. + orthopnea Respiratory:+ shortness of breath, cough, congestion Gastrointestinal: + diffuse abdominal cramping. No vomiting or diarrhea. Genitourinary: Negative for dysuria. Musculoskeletal: Negative for back pain. Skin: Negative for rash. Neurological: Negative for headaches, weakness or numbness.  ____________________________________________   PHYSICAL EXAM:  VITAL SIGNS: ED Triage Vitals  Enc Vitals Group     BP 12/16/15 0952 (!) 163/90     Pulse Rate 12/16/15 0952 81     Resp 12/16/15 0952 20     Temp 12/16/15 0952 98.6 F (37 C)     Temp Source 12/16/15 0952 Oral     SpO2 12/16/15 0952 94 %     Weight 12/16/15 0953 (!) 400 lb (181.4 kg)     Height 12/16/15 0953 _0  (1.753 m)     Head Circumference --      Peak Flow --      Pain Score 12/16/15 0953 8     Pain Loc --      Pain Edu? --      Excl. in [place]? --     Constitutional: Alert and oriented. Morbidly obese  and in no apparent distress. HEENT:      Head: Normocephalic and atraumatic.         Eyes: Conjunctivae are normal. Sclera is non-icteric. EOMI. PERRL      Mouth/Throat: Mucous membranes are moist.       Neck: Supple with no signs of meningismus. Cardiovascular: Regular rate and rhythm. No murmurs, gallops, or rubs. 2+ symmetrical distal pulses are present in all extremities. No JVD. Respiratory: Normal respiratory effort. Lungs are clear to auscultation bilaterally. No wheezes, crackles, or rhonchi.  Gastrointestinal: Obese, mild diffuse ttp, and non distended with positive bowel sounds. No rebound or guarding. Genitourinary: No CVA tenderness. Musculoskeletal: Nontender with normal range of motion in all extremities. No edema, cyanosis, or erythema of extremities. Neurologic: Normal speech and language. Face is symmetric. Moving all extremities. No gross  focal neurologic deficits are appreciated. Skin: Skin is warm, dry and intact. No rash noted. Psychiatric: Mood and affect are normal. Speech and behavior are normal.  ____________________________________________   LABS (all labs ordered are listed, but only abnormal results are displayed)  Labs Reviewed  CBC WITH DIFFERENTIAL/PLATELET - Abnormal; Notable for the following:       Result Value   Hemoglobin 10.6 (*)    HCT 33.7 (*)    MCV 77.9 (*)    MCH 24.6 (*)    MCHC 31.6 (*)    RDW 15.1 (*)    All other components within normal limits  COMPREHENSIVE METABOLIC PANEL - Abnormal; Notable for the following:    Glucose, Bld 113 (*)    Calcium 8.3 (*)    Albumin 3.1 (*)    ALT 12 (*)    All other components within normal limits  URINALYSIS COMPLETEWITH MICROSCOPIC ([facility] ONLY) - Abnormal; Notable for the following:    Color, Urine YELLOW (*)    APPearance CLEAR (*)    Hgb urine dipstick 2+ (*)    Protein, ur >500 (*)    Bacteria, UA RARE (*)    Squamous Epithelial / LPF 0-5 (*)    All other components within normal limits    URINE CULTURE  TROPONIN I  LIPASE, BLOOD  BRAIN NATRIURETIC PEPTIDE   ____________________________________________  EKG  ED ECG REPORT I, Rudene Re, the attending physician, personally viewed and interpreted this ECG.  Normal sinus rhythm with occasional PACs, rate of 84, normal intervals, normal axis, no ST elevations or depressions. ____________________________________________  RADIOLOGY  CXR: Mild pulmonary edema and cardiomegaly  ____________________________________________   PROCEDURES  Procedure(s) performed: None Procedures Critical Care performed:  None ____________________________________________   INITIAL IMPRESSION / ASSESSMENT AND PLAN / ED COURSE  45 y.o. female with CHF, CKD, diabetes, morbidly obese, OSA on 2 L [place] who presents for evaluation of cough productive of sputum, worsening orthopnea and URI symptoms. Chest x-ray concerning for mild pulmonary edema. EKG with no evidence of ischemia. Given a dose of IV Lasix. Patient is breathing comfortably, no crackles on exam, satting 95-97% on 2 L nasal cannula which is baseline. Patient also has mild diffuse cramping currently on her period. Abdominal exam is reassuring. We'll check blood work and give her morphine for pain.  Clinical Course as of Dec 16 1455  Sat Dec 16, 2015  1454 Blood work showing a normal BNP, normal troponin, normal CMP, normal CBC. Patient with evidence of urinary tract infection will be started on Macrobid. She remains with no new oxygen requirement. She received 40 mg of IV Lasix. Abdominal exam remains benign. We'll discharge patient home with close follow-up with cardiology.  [CV]    Clinical Course User Index [CV] Rudene Re, MD    Pertinent labs & imaging results that were available during my care of the patient were reviewed by me and considered in my medical decision making (see chart for details).    ____________________________________________   FINAL  CLINICAL IMPRESSION(S) / ED DIAGNOSES  Final diagnoses:  Acute on chronic congestive heart failure, unspecified congestive heart failure type (HCC)  Urinary tract infection without hematuria, site unspecified      NEW MEDICATIONS STARTED DURING THIS VISIT:  New Prescriptions   NITROFURANTOIN, MACROCRYSTAL-MONOHYDRATE, (MACROBID) 100 MG CAPSULE    Take 1 capsule (100 mg total) by mouth 2 (two) times daily.     Note:  This document was prepared using Systems analyst and may  include unintentional dictation errors.    Rudene Re, MD 12/16/15 [phone]

## 2015-12-16 NOTE — Discharge Instructions (Signed)
Continue to take her Lasix. Follow-up with her doctor on Monday for recheck. Return to the emergency room if you have new or worsening abdominal pain, chest pain, shortness of breath, fever, or any other symptoms concerning to you.

## 2015-12-17 LAB — URINE CULTURE: Culture: <10000 — AB

## 2015-12-20 ENCOUNTER — Encounter: Payer: Self-pay | Admitting: Unknown Physician Specialty

## 2015-12-20 ENCOUNTER — Ambulatory Visit (INDEPENDENT_AMBULATORY_CARE_PROVIDER_SITE_OTHER): Payer: Medicare Other | Admitting: Unknown Physician Specialty

## 2015-12-20 VITALS — BP 136/86 | HR 87 | Temp 98.0°F | Wt 380.8 lb

## 2015-12-20 DIAGNOSIS — I110 Hypertensive heart disease with heart failure: Secondary | ICD-10-CM | POA: Diagnosis not present

## 2015-12-20 DIAGNOSIS — J0100 Acute maxillary sinusitis, unspecified: Secondary | ICD-10-CM | POA: Diagnosis not present

## 2015-12-20 DIAGNOSIS — R0902 Hypoxemia: Secondary | ICD-10-CM

## 2015-12-20 MED ORDER — AMOXICILLIN-POT CLAVULANATE 875-125 MG PO TABS: 1.0000 | ORAL_TABLET | Freq: Two times a day (BID) | ORAL | 0 refills | Status: DC

## 2015-12-20 NOTE — Assessment & Plan Note (Signed)
Pulse ox is 92% on room air

## 2015-12-20 NOTE — Assessment & Plan Note (Signed)
I don't hear any crackles.  If no improvement in SOB, will need to to RTC to the ER.  Taking 40 mg of Lasix

## 2015-12-20 NOTE — Progress Notes (Signed)
BP 136/86 (BP Location: Left Arm, Cuff Size: Large)   Pulse 87   Temp 98 F (36.7 C)   Wt (!) 380 lb 12.8 oz (172.7 kg)   SpO2 92%   BMI 56.23 kg/m    Subjective:    Patient ID: Sara Zhang, female    DOB: 08-02-1970, 45 y.o.   MRN: [id number]  HPI: [name] is a 44 y.o. female  Chief Complaint  Patient presents with  . URI    pt states she has had a cough, congestiong, swollen lymph nodes, yellow phlegm, and headache. states symptoms started 10 days ago. States she went to ER but was treated for a UTI.    URI   Chronicity: Went to ER on Saturday for SOB.  Treated with antibioitc and Lasix for heart failure. Episode onset: 10 days. The problem has been gradually worsening. There has been no fever. Associated symptoms include congestion, coughing and sinus pain. Pertinent negatives include no sore throat.  Cough  Pertinent negatives include no sore throat.     Relevant past medical, surgical, family and social history reviewed and updated as indicated. Interim medical history since our last visit reviewed. Allergies and medications reviewed and updated.  Review of Systems  HENT: Positive for congestion and sinus pain. Negative for sore throat.   Respiratory: Positive for cough.     Per HPI unless specifically indicated above     Objective:    BP 136/86 (BP Location: Left Arm, Cuff Size: Large)   Pulse 87   Temp 98 F (36.7 C)   Wt (!) 380 lb 12.8 oz (172.7 kg)   SpO2 92%   BMI 56.23 kg/m   Wt Readings from Last 3 Encounters:  12/20/15 (!) 380 lb 12.8 oz (172.7 kg)  12/16/15 (!) 400 lb (181.4 kg)  11/20/15 (!) 390 lb (176.9 kg)    Physical Exam  Constitutional: She is oriented to person, place, and time. She appears well-developed and well-nourished. No distress.  HENT:  Head: Normocephalic and atraumatic.  Right Ear: Tympanic membrane and ear canal normal.  Left Ear: Tympanic membrane and ear canal normal.  Nose: No rhinorrhea. Right sinus  exhibits maxillary sinus tenderness. Right sinus exhibits no frontal sinus tenderness. Left sinus exhibits maxillary sinus tenderness. Left sinus exhibits no frontal sinus tenderness.  Eyes: Conjunctivae and lids are normal. Right eye exhibits no discharge. Left eye exhibits no discharge. No scleral icterus.  Cardiovascular: Normal rate and regular rhythm.   Pulmonary/Chest: Effort normal and breath sounds normal. No respiratory distress.  Abdominal: Normal appearance. There is no splenomegaly or hepatomegaly.  Musculoskeletal: Normal range of motion.  Neurological: She is alert and oriented to person, place, and time.  Skin: Skin is intact. No rash noted. No pallor.  Psychiatric: She has a normal mood and affect. Her behavior is normal. Judgment and thought content normal.      Assessment & Plan:   Problem List Items Addressed This Visit      Unprioritized   Hypertensive heart disease with congestive heart failure (HCC)    I don't hear any crackles.  If no improvement in SOB, will need to to RTC to the ER.  Taking 40 mg of Lasix      Hypoxia    Pulse ox is 92% on room air       Other Visit Diagnoses    Acute non-recurrent maxillary sinusitis    -  Primary   rx Augmentin.  Will RTC to  ER if no improvement   Relevant Medications   amoxicillin-clavulanate (AUGMENTIN) 875-125 MG tablet       Follow up plan: Return if symptoms worsen or fail to improve.

## 2016-01-03 ENCOUNTER — Other Ambulatory Visit: Payer: Self-pay | Admitting: Unknown Physician Specialty

## 2016-01-05 HISTORY — PX: TOOTH EXTRACTION: SUR596

## 2016-02-07 ENCOUNTER — Other Ambulatory Visit: Payer: Self-pay | Admitting: Unknown Physician Specialty

## 2016-02-09 ENCOUNTER — Encounter: Payer: Self-pay | Admitting: Unknown Physician Specialty

## 2016-02-09 ENCOUNTER — Ambulatory Visit (INDEPENDENT_AMBULATORY_CARE_PROVIDER_SITE_OTHER): Payer: Medicare Other | Admitting: Unknown Physician Specialty

## 2016-02-09 VITALS — BP 143/91 | HR 81 | Temp 98.3°F | Wt 385.0 lb

## 2016-02-09 DIAGNOSIS — E1122 Type 2 diabetes mellitus with diabetic chronic kidney disease: Secondary | ICD-10-CM

## 2016-02-09 DIAGNOSIS — I110 Hypertensive heart disease with heart failure: Secondary | ICD-10-CM | POA: Diagnosis not present

## 2016-02-09 DIAGNOSIS — E668 Other obesity: Secondary | ICD-10-CM

## 2016-02-09 DIAGNOSIS — Z794 Long term (current) use of insulin: Secondary | ICD-10-CM | POA: Diagnosis not present

## 2016-02-09 DIAGNOSIS — N182 Chronic kidney disease, stage 2 (mild): Secondary | ICD-10-CM

## 2016-02-09 DIAGNOSIS — J40 Bronchitis, not specified as acute or chronic: Secondary | ICD-10-CM | POA: Diagnosis not present

## 2016-02-09 DIAGNOSIS — I129 Hypertensive chronic kidney disease with stage 1 through stage 4 chronic kidney disease, or unspecified chronic kidney disease: Secondary | ICD-10-CM | POA: Diagnosis not present

## 2016-02-09 LAB — BAYER DCA HB A1C WAIVED: HB A1C: 8.5 % — AB (ref <7.0)

## 2016-02-09 MED ORDER — AZITHROMYCIN 250 MG PO TABS: ORAL_TABLET | ORAL | 0 refills | Status: DC

## 2016-02-09 MED ORDER — BLOOD GLUCOSE MONITOR KIT: PACK | 0 refills | Status: DC

## 2016-02-09 NOTE — Assessment & Plan Note (Addendum)
Hg1C is 8.5.  Pt will work on her diet.  Wrote for a home monitor

## 2016-02-09 NOTE — Assessment & Plan Note (Signed)
Discussed weight loss. 

## 2016-02-09 NOTE — Assessment & Plan Note (Addendum)
Not to goal today but sick and she thinks this is why it is up.  Also monitored at the heart failure clinic

## 2016-02-09 NOTE — Progress Notes (Signed)
BP (!) 143/91 (BP Location: Left Arm, Cuff Size: Large)   Pulse 81   Temp 98.3 F (36.8 C)   Wt (!) 385 lb (174.6 kg)   SpO2 (!) 89%   BMI 56.85 kg/m    Subjective:    Patient ID: Sara Zhang, female    DOB: 04-Jul-1970, 46 y.o.   MRN: [id number]  HPI: Sara Zhang is a 46 y.o. female  Chief Complaint  Patient presents with  . Diabetes  . Hyperlipidemia  . Hypertension  . Chest Congestion    pt states she still has a lot of congestion in her chest, states she never really got better from last visit (12/20/15)   Diabetes: Using medications without difficulties.  Taking 64 units of Toujeo No hypoglycemic episodes No hyperglycemic episodes Feet problems: none Blood Sugars averaging: Not checking often "a little high" eye exam within last year Last Hgb A1C: 8.5  Hypertension  Using medications without difficulty Average home BPs Not checking   Using medication without problems or lightheadedness No chest pain with exertion or shortness of breath No Edema  Elevated Cholesterol Using medications without problems No Muscle aches  Diet: Exercise: Admits to eating well but too many sweets  Congestion Having a lot of congestion for about 1 month.  States she is coughing green thick mucous.  Currently on no inhalers.  She does not smoke  Relevant past medical, surgical, family and social history reviewed and updated as indicated. Interim medical history since our last visit reviewed. Allergies and medications reviewed and updated.  Review of Systems  Per HPI unless specifically indicated above     Objective:    BP (!) 143/91 (BP Location: Left Arm, Cuff Size: Large)   Pulse 81   Temp 98.3 F (36.8 C)   Wt (!) 385 lb (174.6 kg)   SpO2 (!) 89%   BMI 56.85 kg/m   Wt Readings from Last 3 Encounters:  02/09/16 (!) 385 lb (174.6 kg)  12/20/15 (!) 380 lb 12.8 oz (172.7 kg)  12/16/15 (!) 400 lb (181.4 kg)    Physical Exam  Constitutional: She is oriented  to person, place, and time. She appears well-developed and well-nourished. No distress.  HENT:  Head: Normocephalic and atraumatic.  Eyes: Conjunctivae and lids are normal. Right eye exhibits no discharge. Left eye exhibits no discharge. No scleral icterus.  Neck: Normal range of motion. Neck supple. No JVD present. Carotid bruit is not present.  Cardiovascular: Normal rate, regular rhythm and normal heart sounds.   Pulmonary/Chest: Effort normal. No accessory muscle usage. No respiratory distress. She has decreased breath sounds. She has no wheezes. She has no rhonchi. She has no rales.  Abdominal: Normal appearance. There is no splenomegaly or hepatomegaly.  Musculoskeletal: Normal range of motion.  Neurological: She is alert and oriented to person, place, and time.  Skin: Skin is warm, dry and intact. No rash noted. No pallor.  Psychiatric: She has a normal mood and affect. Her behavior is normal. Judgment and thought content normal.    Results for orders placed or performed during the hospital encounter of 12/16/15  Urine culture  Result Value Ref Range   Specimen Description URINE, RANDOM    Special Requests NONE    Culture (A)     <10,000 COLONIES/mL INSIGNIFICANT GROWTH Performed at [facility]    Report Status 12/17/2015 FINAL   CBC with Differential  Result Value Ref Range   WBC 7.5 3.6 - 11.0 K/uL  RBC 4.32 3.80 - 5.20 MIL/uL   Hemoglobin 10.6 (L) 12.0 - 16.0 g/dL   HCT 62.1 (L) 30.8 - 65.7 %   MCV 77.9 (L) 80.0 - 100.0 fL   MCH 24.6 (L) 26.0 - 34.0 pg   MCHC 31.6 (L) 32.0 - 36.0 g/dL   RDW 84.6 (H) 96.2 - 95.2 %   Platelets 346 150 - 440 K/uL   Neutrophils Relative % 66 %   Neutro Abs 4.9 1.4 - 6.5 K/uL   Lymphocytes Relative 24 %   Lymphs Abs 1.8 1.0 - 3.6 K/uL   Monocytes Relative 8 %   Monocytes Absolute 0.6 0.2 - 0.9 K/uL   Eosinophils Relative 1 %   Eosinophils Absolute 0.1 0 - 0.7 K/uL   Basophils Relative 1 %   Basophils Absolute 0.1 0 - 0.1 K/uL   Comprehensive metabolic panel  Result Value Ref Range   Sodium 139 135 - 145 mmol/L   Potassium 4.1 3.5 - 5.1 mmol/L   Chloride 102 101 - 111 mmol/L   CO2 30 22 - 32 mmol/L   Glucose, Bld 113 (H) 65 - 99 mg/dL   BUN 12 6 - 20 mg/dL   Creatinine, Ser 8.41 0.44 - 1.00 mg/dL   Calcium 8.3 (L) 8.9 - 10.3 mg/dL   Total Protein 7.7 6.5 - 8.1 g/dL   Albumin 3.1 (L) 3.5 - 5.0 g/dL   AST 21 15 - 41 U/L   ALT 12 (L) 14 - 54 U/L   Alkaline Phosphatase 78 38 - 126 U/L   Total Bilirubin 0.3 0.3 - 1.2 mg/dL   GFR calc non Af Amer >60 >60 mL/min   GFR calc Af Amer >60 >60 mL/min   Anion gap 7 5 - 15  Troponin I  Result Value Ref Range   Troponin I <0.03 <0.03 ng/mL  Lipase, blood  Result Value Ref Range   Lipase 14 11 - 51 U/L  Urinalysis complete, with microscopic ([facility] only)  Result Value Ref Range   Color, Urine YELLOW (A) YELLOW   APPearance CLEAR (A) CLEAR   Glucose, UA NEGATIVE NEGATIVE mg/dL   Bilirubin Urine NEGATIVE NEGATIVE   Ketones, ur NEGATIVE NEGATIVE mg/dL   Specific Gravity, Urine 1.030 1.005 - 1.030   Hgb urine dipstick 2+ (A) NEGATIVE   pH 5.0 5.0 - 8.0   Protein, ur >500 (A) NEGATIVE mg/dL   Nitrite NEGATIVE NEGATIVE   Leukocytes, UA NEGATIVE NEGATIVE   RBC / HPF TOO NUMEROUS TO COUNT 0 - 5 RBC/hpf   WBC, UA 6-30 0 - 5 WBC/hpf   Bacteria, UA RARE (A) NONE SEEN   Squamous Epithelial / LPF 0-5 (A) NONE SEEN   Mucous PRESENT    Hyaline Casts, UA PRESENT   Brain natriuretic peptide  Result Value Ref Range   B Natriuretic Peptide 51.0 0.0 - 100.0 pg/mL      Assessment & Plan:   Problem List Items Addressed This Visit      Unprioritized   Diabetes (HCC)   Relevant Orders   Lipid Panel w/o Chol/HDL Ratio   Comprehensive metabolic panel   Bayer DCA Hb L2G Waived   Extreme obesity (HCC)    Discussed weight loss      Hypertensive CKD (chronic kidney disease)    Not to goal today but sick and she thinks this is why it is up.  Also monitored at the heart  failure clinic      Relevant Orders   Comprehensive metabolic  panel   Hypertensive heart disease with congestive heart failure (HCC) - Primary   Type 2 diabetes mellitus with chronic kidney disease (HCC)    Hg1C is 8.5.  Pt will work on her diet.  Wrote for a home monitor      Relevant Orders   Lipid Panel w/o Chol/HDL Ratio   Comprehensive metabolic panel   Bayer DCA Hb Z6X Waived    Other Visit Diagnoses    Bronchitis       for 1 month.  Treat with Z pack       Follow up plan: Return in about 3 months (around 05/09/2016).

## 2016-02-10 LAB — LIPID PANEL W/O CHOL/HDL RATIO
CHOLESTEROL TOTAL: 143 mg/dL (ref 100–199)
HDL: 38 mg/dL — AB (ref >39)
LDL CALC: 93 mg/dL (ref 0–99)
TRIGLYCERIDES: 61 mg/dL (ref 0–149)
VLDL CHOLESTEROL CAL: 12 mg/dL (ref 5–40)

## 2016-02-10 LAB — COMPREHENSIVE METABOLIC PANEL
A/G RATIO: 0.8 — AB (ref 1.2–2.2)
ALK PHOS: 106 IU/L (ref 39–117)
ALT: 14 IU/L (ref 0–32)
AST: 9 IU/L (ref 0–40)
Albumin: 3.2 g/dL — ABNORMAL LOW (ref 3.5–5.5)
BILIRUBIN TOTAL: 0.3 mg/dL (ref 0.0–1.2)
BUN/Creatinine Ratio: 10 (ref 9–23)
BUN: 10 mg/dL (ref 6–24)
CHLORIDE: 94 mmol/L — AB (ref 96–106)
CO2: 31 mmol/L — ABNORMAL HIGH (ref 18–29)
Calcium: 8.5 mg/dL — ABNORMAL LOW (ref 8.7–10.2)
Creatinine, Ser: 0.96 mg/dL (ref 0.57–1.00)
GFR calc non Af Amer: 71 mL/min/{1.73_m2} (ref >59)
GFR, EST AFRICAN AMERICAN: 81 mL/min/{1.73_m2} (ref >59)
Globulin, Total: 3.8 g/dL (ref 1.5–4.5)
Glucose: 273 mg/dL — ABNORMAL HIGH (ref 65–99)
POTASSIUM: 5.4 mmol/L — AB (ref 3.5–5.2)
Sodium: 138 mmol/L (ref 134–144)
Total Protein: 7 g/dL (ref 6.0–8.5)

## 2016-05-10 ENCOUNTER — Ambulatory Visit: Payer: Medicare Other | Admitting: Unknown Physician Specialty

## 2016-05-17 DIAGNOSIS — G4733 Obstructive sleep apnea (adult) (pediatric): Secondary | ICD-10-CM | POA: Diagnosis not present

## 2016-05-21 ENCOUNTER — Other Ambulatory Visit: Payer: Self-pay | Admitting: Unknown Physician Specialty

## 2016-05-21 ENCOUNTER — Encounter: Payer: Self-pay | Admitting: Unknown Physician Specialty

## 2016-05-21 ENCOUNTER — Ambulatory Visit (INDEPENDENT_AMBULATORY_CARE_PROVIDER_SITE_OTHER): Payer: Medicare Other | Admitting: Unknown Physician Specialty

## 2016-05-21 VITALS — BP 154/90 | HR 96 | Temp 98.1°F | Ht 69.72 in | Wt 395.6 lb

## 2016-05-21 DIAGNOSIS — R5383 Other fatigue: Secondary | ICD-10-CM

## 2016-05-21 DIAGNOSIS — I110 Hypertensive heart disease with heart failure: Secondary | ICD-10-CM | POA: Diagnosis not present

## 2016-05-21 DIAGNOSIS — N182 Chronic kidney disease, stage 2 (mild): Secondary | ICD-10-CM

## 2016-05-21 DIAGNOSIS — E782 Mixed hyperlipidemia: Secondary | ICD-10-CM

## 2016-05-21 DIAGNOSIS — R0902 Hypoxemia: Secondary | ICD-10-CM

## 2016-05-21 DIAGNOSIS — E1122 Type 2 diabetes mellitus with diabetic chronic kidney disease: Secondary | ICD-10-CM

## 2016-05-21 DIAGNOSIS — I501 Left ventricular failure: Secondary | ICD-10-CM | POA: Diagnosis not present

## 2016-05-21 MED ORDER — EXENATIDE ER 2 MG/0.85ML ~~LOC~~ AUIJ: 2.0000 mg | AUTO-INJECTOR | SUBCUTANEOUS | 12 refills | Status: DC

## 2016-05-21 MED ORDER — ATENOLOL 25 MG PO TABS: 25.0000 mg | ORAL_TABLET | Freq: Every day | ORAL | 1 refills | Status: DC

## 2016-05-21 MED ORDER — FUROSEMIDE 40 MG PO TABS: 40.0000 mg | ORAL_TABLET | Freq: Every day | ORAL | 1 refills | Status: DC

## 2016-05-21 MED ORDER — GLIPIZIDE 10 MG PO TABS: 10.0000 mg | ORAL_TABLET | Freq: Two times a day (BID) | ORAL | 1 refills | Status: DC

## 2016-05-21 MED ORDER — LISINOPRIL 5 MG PO TABS: 5.0000 mg | ORAL_TABLET | Freq: Every day | ORAL | 1 refills | Status: DC

## 2016-05-21 MED ORDER — INSULIN GLARGINE 300 UNIT/ML ~~LOC~~ SOPN: 50.0000 [IU] | PEN_INJECTOR | Freq: Every day | SUBCUTANEOUS | 6 refills | Status: DC

## 2016-05-21 MED ORDER — ATORVASTATIN CALCIUM 10 MG PO TABS: 10.0000 mg | ORAL_TABLET | Freq: Every day | ORAL | 1 refills | Status: DC

## 2016-05-21 NOTE — Progress Notes (Signed)
BP (!) 154/90 (Patient Position: Sitting, Cuff Size: Large)   Pulse 96   Temp 98.1 F (36.7 C)   Ht 5' 9.72" (1.771 m)   Wt (!) 395 lb 9.6 oz (179.4 kg)   SpO2 (!) 88%   BMI 57.21 kg/m    Subjective:    Patient ID: Sara Zhang, female    DOB: 05/16/1970, 46 y.o.   MRN: [id number]  HPI: Sara Zhang is a 46 y.o. female  Chief Complaint  Patient presents with  . Diabetes  . Hyperlipidemia  . Hypertension   States she doesn't feel well.  Has gained fluid.    Diabetes: Takes 64 u of Toujeo QHS Using medications without difficulties No hypoglycemic episodes  No hyperglycemic episodes Feet problems: Not taking Blood Sugars averaging:"it is 2 something" eye exam within last year Last Hgb A1C: 8.5  Hypertension  Using medications without difficulty Average home BPs "it was high" at another doctors office   Using medication without problems or lightheadedness No chest pain with exertion No Edema  Elevated Cholesterol Using medications without problems No Muscle aches  Diet/Exercise: Not doing well.  "I am miserable"  Heart Failure Feels she has a lot of fluid and short winded.  Was going to [facility] cardiology but over a year since she was seen.  Feels the fluid started in January.  Lacks energy.  She has gained 10 pounds but "I don't know how."  No change in diet.    Relevant past medical, surgical, family and social history reviewed and updated as indicated. Interim medical history since our last visit reviewed. Allergies and medications reviewed and updated.  Review of Systems  Per HPI unless specifically indicated above     Objective:    BP (!) 154/90 (Patient Position: Sitting, Cuff Size: Large)   Pulse 96   Temp 98.1 F (36.7 C)   Ht 5' 9.72" (1.771 m)   Wt (!) 395 lb 9.6 oz (179.4 kg)   SpO2 (!) 88%   BMI 57.21 kg/m   Wt Readings from Last 3 Encounters:  05/21/16 (!) 395 lb 9.6 oz (179.4 kg)  02/09/16 (!) 385 lb (174.6 kg)  12/20/15 (!) 380 lb  12.8 oz (172.7 kg)    Physical Exam  Constitutional: She is oriented to person, place, and time. She appears well-developed and well-nourished. She is cooperative.  Extreme obesity  HENT:  Head: Normocephalic and atraumatic.  Eyes: Pupils are equal, round, and reactive to light. Right eye exhibits no discharge. Left eye exhibits no discharge. No scleral icterus.  Neck: Normal range of motion. Neck supple. Carotid bruit is not present. No thyromegaly present.  Cardiovascular: Normal rate and regular rhythm.  Exam reveals no gallop and no friction rub.   Murmur heard.  Systolic murmur is present with a grade of 1/6  Pulmonary/Chest: Effort normal. No respiratory distress. She has decreased breath sounds. She has no wheezes. She has no rales.  Using O2 while in the room.  Abdominal: Soft. Bowel sounds are normal. There is no tenderness. There is no rebound.  Genitourinary: No breast swelling, tenderness or discharge.  Musculoskeletal: Normal range of motion.  Lymphadenopathy:    She has no cervical adenopathy.  Neurological: She is alert and oriented to person, place, and time.  Skin: Skin is warm, dry and intact. No rash noted.  Psychiatric: She has a normal mood and affect. Her speech is normal and behavior is normal. Judgment and thought content normal. Cognition and memory are  normal.    Results for orders placed or performed in visit on 02/09/16  Lipid Panel w/o Chol/HDL Ratio  Result Value Ref Range   Cholesterol, Total 143 100 - 199 mg/dL   Triglycerides 61 0 - 149 mg/dL   HDL 38 (L) >16 mg/dL   VLDL Cholesterol Cal 12 5 - 40 mg/dL   LDL Calculated 93 0 - 99 mg/dL  Comprehensive metabolic panel  Result Value Ref Range   Glucose 273 (H) 65 - 99 mg/dL   BUN 10 6 - 24 mg/dL   Creatinine, Ser 1.09 0.57 - 1.00 mg/dL   GFR calc non Af Amer 71 >59 mL/min/1.73   GFR calc Af Amer 81 >59 mL/min/1.73   BUN/Creatinine Ratio 10 9 - 23   Sodium 138 134 - 144 mmol/L   Potassium 5.4 (H)  3.5 - 5.2 mmol/L   Chloride 94 (L) 96 - 106 mmol/L   CO2 31 (H) 18 - 29 mmol/L   Calcium 8.5 (L) 8.7 - 10.2 mg/dL   Total Protein 7.0 6.0 - 8.5 g/dL   Albumin 3.2 (L) 3.5 - 5.5 g/dL   Globulin, Total 3.8 1.5 - 4.5 g/dL   Albumin/Globulin Ratio 0.8 (L) 1.2 - 2.2   Bilirubin Total 0.3 0.0 - 1.2 mg/dL   Alkaline Phosphatase 106 39 - 117 IU/L   AST 9 0 - 40 IU/L   ALT 14 0 - 32 IU/L  Bayer DCA Hb A1c Waived  Result Value Ref Range   Bayer DCA Hb A1c Waived 8.5 (H) <7.0 %      Assessment & Plan:   Problem List Items Addressed This Visit      Unprioritized   Hyperlipidemia    Await lipid panel      Relevant Medications   atenolol (TENORMIN) 25 MG tablet   atorvastatin (LIPITOR) 10 MG tablet   furosemide (LASIX) 40 MG tablet   lisinopril (PRINIVIL,ZESTRIL) 5 MG tablet   Other Relevant Orders   Lipid Panel w/o Chol/HDL Ratio   Hypertensive heart disease with congestive heart failure (HCC)   Relevant Medications   atenolol (TENORMIN) 25 MG tablet   atorvastatin (LIPITOR) 10 MG tablet   furosemide (LASIX) 40 MG tablet   lisinopril (PRINIVIL,ZESTRIL) 5 MG tablet   Other Relevant Orders   Comprehensive metabolic panel   Hypoxia    On room air.  Comfortable on O2 in the room      Left heart failure (HCC)    Has not seen cardiology.  ? If weight gain and SOB is related.  Will refer to cardiology and get a chest x-ray      Relevant Medications   atenolol (TENORMIN) 25 MG tablet   atorvastatin (LIPITOR) 10 MG tablet   furosemide (LASIX) 40 MG tablet   lisinopril (PRINIVIL,ZESTRIL) 5 MG tablet   Other Relevant Orders   DG Chest 2 View   Ambulatory referral to Cardiology   Type 2 diabetes mellitus with chronic kidney disease (HCC) - Primary    Poor control with Hgb A1C 9.2%.  Pt states Bydureon hurt too much.  Start Bcise.  First injection given in the office.  Pt tolerated well      Relevant Medications   Exenatide ER (BYDUREON BCISE) 2 MG/0.85ML AUIJ   Insulin Glargine  (TOUJEO SOLOSTAR) 300 UNIT/ML SOPN   atorvastatin (LIPITOR) 10 MG tablet   glipiZIDE (GLUCOTROL) 10 MG tablet   lisinopril (PRINIVIL,ZESTRIL) 5 MG tablet   Other Relevant Orders   Bayer DCA Hb  A1c Waived    Other Visit Diagnoses    Fatigue, unspecified type       Relevant Orders   CBC with Differential/Platelet   TSH     Will decide if needs extra Lasix depending on chest x-ray results   Follow up plan: Return in about 3 months (around 08/20/2016).

## 2016-05-21 NOTE — Assessment & Plan Note (Signed)
On room air.  Comfortable on O2 in the room

## 2016-05-21 NOTE — Assessment & Plan Note (Addendum)
Poor control with Hgb A1C 9.2%.  Pt states Bydureon hurt too much.  Start Bcise.  First injection given in the office.  Pt tolerated well

## 2016-05-21 NOTE — Assessment & Plan Note (Signed)
Has not seen cardiology.  ? If weight gain and SOB is related.  Will refer to cardiology and get a chest x-ray

## 2016-05-21 NOTE — Assessment & Plan Note (Signed)
Await lipid panel 

## 2016-05-22 ENCOUNTER — Ambulatory Visit
Admission: RE | Admit: 2016-05-22 | Discharge: 2016-05-22 | Disposition: A | Discharge status: Home / Self Care | Payer: Medicare Other | Source: Ambulatory Visit | Attending: Unknown Physician Specialty | Admitting: Unknown Physician Specialty

## 2016-05-22 DIAGNOSIS — I501 Left ventricular failure: Secondary | ICD-10-CM | POA: Insufficient documentation

## 2016-05-22 DIAGNOSIS — R0602 Shortness of breath: Secondary | ICD-10-CM | POA: Diagnosis not present

## 2016-05-22 DIAGNOSIS — I517 Cardiomegaly: Secondary | ICD-10-CM | POA: Insufficient documentation

## 2016-05-22 LAB — TSH: TSH: 5.88 u[IU]/mL — ABNORMAL HIGH (ref 0.450–4.500)

## 2016-05-22 LAB — CBC WITH DIFFERENTIAL/PLATELET
BASOS ABS: 0 10*3/uL (ref 0.0–0.2)
Basos: 0 %
EOS (ABSOLUTE): 0.4 10*3/uL (ref 0.0–0.4)
EOS: 4 %
HEMATOCRIT: 27.7 % — AB (ref 34.0–46.6)
HEMOGLOBIN: 8.1 g/dL — AB (ref 11.1–15.9)
Immature Grans (Abs): 0 10*3/uL (ref 0.0–0.1)
Immature Granulocytes: 0 %
LYMPHS ABS: 2.2 10*3/uL (ref 0.7–3.1)
Lymphs: 24 %
MCH: 21.8 pg — AB (ref 26.6–33.0)
MCHC: 29.2 g/dL — AB (ref 31.5–35.7)
MCV: 75 fL — ABNORMAL LOW (ref 79–97)
MONOCYTES: 8 %
MONOS ABS: 0.7 10*3/uL (ref 0.1–0.9)
NEUTROS PCT: 64 %
Neutrophils Absolute: 5.8 10*3/uL (ref 1.4–7.0)
Platelets: 442 10*3/uL — ABNORMAL HIGH (ref 150–379)
RBC: 3.71 x10E6/uL — ABNORMAL LOW (ref 3.77–5.28)
RDW: 15.5 % — AB (ref 12.3–15.4)
WBC: 9.2 10*3/uL (ref 3.4–10.8)

## 2016-05-22 LAB — COMPREHENSIVE METABOLIC PANEL
ALBUMIN: 3.4 g/dL — AB (ref 3.5–5.5)
ALK PHOS: 104 IU/L (ref 39–117)
ALT: 10 IU/L (ref 0–32)
AST: 11 IU/L (ref 0–40)
Albumin/Globulin Ratio: 0.9 — ABNORMAL LOW (ref 1.2–2.2)
BILIRUBIN TOTAL: 0.2 mg/dL (ref 0.0–1.2)
BUN / CREAT RATIO: 20 (ref 9–23)
BUN: 20 mg/dL (ref 6–24)
CO2: 30 mmol/L — ABNORMAL HIGH (ref 18–29)
Calcium: 8.4 mg/dL — ABNORMAL LOW (ref 8.7–10.2)
Chloride: 97 mmol/L (ref 96–106)
Creatinine, Ser: 1.02 mg/dL — ABNORMAL HIGH (ref 0.57–1.00)
GFR calc Af Amer: 76 mL/min/{1.73_m2} (ref >59)
GFR calc non Af Amer: 66 mL/min/{1.73_m2} (ref >59)
GLOBULIN, TOTAL: 3.6 g/dL (ref 1.5–4.5)
GLUCOSE: 208 mg/dL — AB (ref 65–99)
POTASSIUM: 5.3 mmol/L — AB (ref 3.5–5.2)
SODIUM: 140 mmol/L (ref 134–144)
Total Protein: 7 g/dL (ref 6.0–8.5)

## 2016-05-22 LAB — LIPID PANEL W/O CHOL/HDL RATIO
CHOLESTEROL TOTAL: 132 mg/dL (ref 100–199)
HDL: 37 mg/dL — ABNORMAL LOW (ref >39)
LDL Calculated: 77 mg/dL (ref 0–99)
Triglycerides: 89 mg/dL (ref 0–149)
VLDL Cholesterol Cal: 18 mg/dL (ref 5–40)

## 2016-05-22 LAB — PLEASE NOTE

## 2016-05-22 LAB — BAYER DCA HB A1C WAIVED: HB A1C: 9.2 % — AB (ref <7.0)

## 2016-05-27 ENCOUNTER — Telehealth: Payer: Self-pay | Admitting: Unknown Physician Specialty

## 2016-05-27 DIAGNOSIS — D500 Iron deficiency anemia secondary to blood loss (chronic): Secondary | ICD-10-CM | POA: Insufficient documentation

## 2016-05-27 MED ORDER — FERROUS SULFATE 325 (65 FE) MG PO TABS: 325.0000 mg | ORAL_TABLET | Freq: Every day | ORAL | 3 refills | Status: DC

## 2016-05-27 NOTE — Telephone Encounter (Signed)
Will come by this week for CBC

## 2016-05-27 NOTE — Telephone Encounter (Signed)
Called to discussed anemia.  Pt admits to having extra bleeding with her periods.  Appt with gyn on May 9th.  Start iron therapy OTC until she can get in to gyn

## 2016-05-29 ENCOUNTER — Other Ambulatory Visit: Payer: Medicare Other

## 2016-05-29 DIAGNOSIS — D500 Iron deficiency anemia secondary to blood loss (chronic): Secondary | ICD-10-CM | POA: Diagnosis not present

## 2016-05-29 LAB — CBC WITH DIFFERENTIAL/PLATELET
HEMATOCRIT: 31.7 % — AB (ref 34.0–46.6)
Hemoglobin: 8.9 g/dL — ABNORMAL LOW (ref 11.1–15.9)
LYMPHS: 35 %
Lymphocytes Absolute: 3.5 10*3/uL — ABNORMAL HIGH (ref 0.7–3.1)
MCH: 22 pg — ABNORMAL LOW (ref 26.6–33.0)
MCHC: 28.1 g/dL — AB (ref 31.5–35.7)
MCV: 79 fL (ref 79–97)
MID (ABSOLUTE): 0.9 10*3/uL (ref 0.1–1.6)
MID: 9 %
NEUTROS PCT: 56 %
Neutrophils Absolute: 5.5 10*3/uL (ref 1.4–7.0)
Platelets: 574 10*3/uL — ABNORMAL HIGH (ref 150–379)
RBC: 4.04 x10E6/uL (ref 3.77–5.28)
RDW: 16.5 % — AB (ref 12.3–15.4)
WBC: 9.9 10*3/uL (ref 3.4–10.8)

## 2016-06-05 ENCOUNTER — Ambulatory Visit (INDEPENDENT_AMBULATORY_CARE_PROVIDER_SITE_OTHER): Payer: Medicare Other | Admitting: Internal Medicine

## 2016-06-05 ENCOUNTER — Encounter: Payer: Self-pay | Admitting: Internal Medicine

## 2016-06-05 VITALS — BP 118/72 | HR 74 | Ht 69.0 in | Wt 397.5 lb

## 2016-06-05 DIAGNOSIS — R0602 Shortness of breath: Secondary | ICD-10-CM

## 2016-06-05 DIAGNOSIS — Z79899 Other long term (current) drug therapy: Secondary | ICD-10-CM | POA: Diagnosis not present

## 2016-06-05 DIAGNOSIS — E782 Mixed hyperlipidemia: Secondary | ICD-10-CM | POA: Diagnosis not present

## 2016-06-05 DIAGNOSIS — Z5181 Encounter for therapeutic drug level monitoring: Secondary | ICD-10-CM

## 2016-06-05 DIAGNOSIS — I10 Essential (primary) hypertension: Secondary | ICD-10-CM | POA: Diagnosis not present

## 2016-06-05 DIAGNOSIS — I110 Hypertensive heart disease with heart failure: Secondary | ICD-10-CM | POA: Diagnosis not present

## 2016-06-05 DIAGNOSIS — I5022 Chronic systolic (congestive) heart failure: Secondary | ICD-10-CM | POA: Diagnosis not present

## 2016-06-05 MED ORDER — FUROSEMIDE 40 MG PO TABS: 40.0000 mg | ORAL_TABLET | Freq: Two times a day (BID) | ORAL | 3 refills | Status: DC

## 2016-06-05 NOTE — Patient Instructions (Signed)
Medication Instructions:  Your physician has recommended you make the following change in your medication:  1- START TAKING Furosemide 40 mg (1 tablet) by mouth two times a day.   Labwork: Your physician recommends that you return for lab work in: 1 WEEK AT THE [facility] ([facility]). - Please go to the [facility]. You will check in at the front desk to the right as you walk into the atrium. Valet Parking is offered if needed.     Testing/Procedures: Your physician has requested that you have an echocardiogram. Echocardiography is a painless test that uses sound waves to create images of your heart. It provides your doctor with information about the size and shape of your heart and how well your heart's chambers and valves are working. This procedure takes approximately one hour. There are no restrictions for this procedure.    Follow-Up: Your physician recommends that you schedule a follow-up appointment in: 1 MONTH WITH DR END.   If you need a refill on your cardiac medications before your next appointment, please call your pharmacy.  Echocardiogram An echocardiogram, or echocardiography, uses sound waves (ultrasound) to produce an image of your heart. The echocardiogram is simple, painless, obtained within a short period of time, and offers valuable information to your health care provider. The images from an echocardiogram can provide information such as:  Evidence of coronary artery disease (CAD).  Heart size.  Heart muscle function.  Heart valve function.  Aneurysm detection.  Evidence of a past heart attack.  Fluid buildup around the heart.  Heart muscle thickening.  Assess heart valve function. Tell a health care provider about:  Any allergies you have.  All medicines you are taking, including vitamins, herbs, eye drops, creams, and over-the-counter medicines.  Any problems you or family members have had with anesthetic medicines.  Any blood  disorders you have.  Any surgeries you have had.  Any medical conditions you have.  Whether you are pregnant or may be pregnant. What happens before the procedure? No special preparation is needed. Eat and drink normally. What happens during the procedure?  In order to produce an image of your heart, gel will be applied to your chest and a wand-like tool (transducer) will be moved over your chest. The gel will help transmit the sound waves from the transducer. The sound waves will harmlessly bounce off your heart to allow the heart images to be captured in real-time motion. These images will then be recorded.  You may need an IV to receive a medicine that improves the quality of the pictures. What happens after the procedure? You may return to your normal schedule including diet, activities, and medicines, unless your health care provider tells you otherwise. This information is not intended to replace advice given to you by your health care provider. Make sure you discuss any questions you have with your health care provider. Document Released: 01/19/2000 Document Revised: 09/09/2015 Document Reviewed: 09/28/2012 Elsevier Interactive Patient Education  2017 [employer].

## 2016-06-05 NOTE — Progress Notes (Signed)
New Outpatient Visit Date: 06/05/2016  Referring Provider: Kathrine Haddock, NP [place], [place] [address]  Chief Complaint: Shortness of breath and history of heart failure  HPI:  Sara Zhang is a 46 y.o. female who is being seen today for the evaluation of shortness of breath at the request of Sara Zhang. She has a history of heart failure (LVEF reportedly 25-35% on hospital discharge summary from 02/2012), hypertension, hyperlipidemia, and type 2 diabetes mellitus. Sara Zhang reports significant exertional dyspnea that limits her to walking just 50 feet before needing to stop. This is been worsening over the last few months, though it has been a long-standing problem. The patient notes that her dyspnea seems to have been exacerbated by recent heavy menses and significant anemia. She has been started on iron with some improvement in her shortness of breath. She is also scheduled for gynecologic follow-up next week for further management of her heavy menses. A few weeks ago, Sara Zhang also had an episode of chest pain lasting 1 to 2 seconds. She describes a substernal tightness without radiation. This was nonexertional and resolved spontaneously. She notes having sporadic episodes in the past as well. She endorses chronic leg edema and has also gained about 10 pounds in the last 3 months. She feels as though her abdomen is swollen and bloated. She has chronic orthopnea and PND, wearing BiPAP most nights. She has also been using home oxygen (2 L via nasal cannula) for many years, though she is not wearing oxygen at this time. This was originally prescribed through [facility] pulmonology, whom the patient is not seen in several years. She denies palpitations, lightheadedness, and syncope.  In regard to her history of heart failure, the patient is unclear about the exact timing of this diagnosis. She believes that she was seen at [facility] cardiology at some point within the last 5 years. She has never  undergone stress testing or cardiac catheterization.  --------------------------------------------------------------------------------------------------  Cardiovascular History & Procedures: Cardiovascular Problems:  Cardiomyopathy with reported chronic systolic heart failure  Risk Factors:  Hypertension, hyperlipidemia, diabetes mellitus, and obesity  Cath/PCI:  None  CV Surgery:  None  EP Procedures and Devices:  None  Non-Invasive Evaluation(s):  Report of LVEF 25-35% with global hypokinesis by outside echo on her before 03/04/12  Recent CV Pertinent Labs: Lab Results  Component Value Date   CHOL 132 05/21/2016   CHOL 137 08/02/2015   HDL 37 (L) 05/21/2016   LDLCALC 77 05/21/2016   TRIG 89 05/21/2016   TRIG 80 08/02/2015   BNP 51.0 12/16/2015   K 5.3 (H) 05/21/2016   K 4.2 09/25/2013   BUN 20 05/21/2016   BUN 12 09/25/2013   CREATININE 1.02 (H) 05/21/2016   CREATININE 0.75 09/25/2013    --------------------------------------------------------------------------------------------------  Past Medical History:  Diagnosis Date  . CHF (congestive heart failure) ([place])   . Chronic kidney disease   . Diabetes mellitus without complication ([place])   . Extreme obesity   . Hyperlipidemia   . Hypertension   . Hypoxia   . Left heart failure ([place])   . Sleep apnea   . Vitamin D deficiency     Past Surgical History:  Procedure Laterality Date  . CESAREAN SECTION    . CHOLECYSTECTOMY    . TONSILLECTOMY AND ADENOIDECTOMY    . TOOTH EXTRACTION  01/2016    Outpatient Encounter Prescriptions as of 06/05/2016  Medication Sig  . atenolol (TENORMIN) 25 MG tablet Take 1 tablet (25 mg total) by  mouth daily.  Marland Kitchen atorvastatin (LIPITOR) 10 MG tablet Take 1 tablet (10 mg total) by mouth daily.  . blood glucose meter kit and supplies KIT Dispense based on patient and insurance preference. Use up to four times daily as directed. (FOR ICD-9 250.00, 250.01).  Marland Kitchen Exenatide ER  (BYDUREON BCISE) 2 MG/0.85ML AUIJ Inject 2 mg into the skin once a week.  . ferrous sulfate 325 (65 FE) MG tablet Take 1 tablet (325 mg total) by mouth daily with breakfast.  . furosemide (LASIX) 40 MG tablet Take 1 tablet (40 mg total) by mouth daily.  Marland Kitchen glipiZIDE (GLUCOTROL) 10 MG tablet Take 1 tablet (10 mg total) by mouth 2 (two) times daily before a meal.  . Insulin Glargine (TOUJEO SOLOSTAR) 300 UNIT/ML SOPN Inject 50 Units into the skin at bedtime.  Marland Kitchen lisinopril (PRINIVIL,ZESTRIL) 5 MG tablet Take 1 tablet (5 mg total) by mouth daily.   No facility-administered encounter medications on file as of 06/05/2016.     Allergies: Patient has no known allergies.  Social History   Social History  . Marital status: Single    Spouse name: N/A  . Number of children: N/A  . Years of education: N/A   Occupational History  . Not on file.   Social History Main Topics  . Smoking status: Never Smoker  . Smokeless tobacco: Former Systems developer  . Alcohol use 0.0 oz/week     Comment: on occasion  . Drug use: No  . Sexual activity: Yes   Other Topics Concern  . Not on file   Social History Narrative  . No narrative on file    Family History  Problem Relation Age of Onset  . Diabetes Mother   . Hyperlipidemia Mother   . Hypertension Mother   . Arthritis Father   . Asthma Father   . Diabetes Father   . Hypertension Father   . Hypertension Brother   . Diabetes Brother   . Diabetes Maternal Grandmother   . Hypertension Maternal Grandmother   . Diabetes Maternal Grandfather   . Hypertension Maternal Grandfather   . Diabetes Paternal Grandmother   . Hypertension Paternal Grandmother     Review of Systems: A 12-system review of systems was performed and was negative except as noted in the HPI.  --------------------------------------------------------------------------------------------------  Physical Exam: BP 118/72 (BP Location: Right Arm, Patient Position: Sitting, Cuff Size: Large)    Pulse 74   Ht 5' 9" (1.753 m)   Wt (!) 397 lb 8 oz (180.3 kg)   BMI 58.70 kg/m   General:  Morbidly obese woman, seated comfortably in the exam room. HEENT: Conjunctival pallor noted. No scleral icterus.  Moist mucous membranes.  OP clear. Neck: Supple without lymphadenopathy or thyromegaly. No obvious JVD or HJR, though evaluation is limited by body habitus.  No carotid bruit. Lungs: Normal work of breathing.  Clear to auscultation bilaterally without wheezes or crackles. Heart: Distant heart sounds with 1/6 systolic murmur loudest at the left upper sternal border. No rubs or gallops. Unable to assess PMI due to body habitus. Abd: Bowel sounds present.  Soft, NT/ND. Unable to assess HSM due to body habitus. Ext: Trace pretibial edema bilaterally.  Radial, PT, and DP pulses are 2+ bilaterally Skin: warm and dry without rash Neuro: CNIII-XII intact.  Strength and fine-touch sensation intact in upper and lower extremities bilaterally. Psych: Normal mood and affect.  EKG:  Normal sinus rhythm with first-degree AV block (PR interval 210 ms). PAC noted on prior tracing  from 12/16/15 is no longer evident. PR interval has lengthened slightly since that time (I personally reviewed both tracings).  Lab Results  Component Value Date   WBC 9.9 05/29/2016   HGB 10.6 (L) 12/16/2015   HCT 31.7 (L) 05/29/2016   MCV 79 05/29/2016   PLT 574 (H) 05/29/2016    Lab Results  Component Value Date   NA 140 05/21/2016   K 5.3 (H) 05/21/2016   CL 97 05/21/2016   CO2 30 (H) 05/21/2016   BUN 20 05/21/2016   CREATININE 1.02 (H) 05/21/2016   GLUCOSE 208 (H) 05/21/2016   ALT 10 05/21/2016    Lab Results  Component Value Date   CHOL 132 05/21/2016   HDL 37 (L) 05/21/2016   LDLCALC 77 05/21/2016   TRIG 89 05/21/2016    --------------------------------------------------------------------------------------------------  ASSESSMENT AND PLAN: Hypertensive heart disease with chronic systolic heart  failure Patient's morbid obesity complicates assessment. However, she appears to have some leg edema and expresses symptoms of volume overload, including exertional dyspnea and orthopnea consistent with NYHA class III heart failure. We have agreed to increase furosemide to 40 mg twice a day and repeat a basic metabolic panel in about a week. We will obtain a transthoracic echocardiogram to reevaluate her LV size and function as well as to look for other structural abnormalities. Based on these results, we may need to consider ischemia evaluation (pharmacologic stress test versus catheterization). We will continue with atenolol and lisinopril for the time being. However, if the EF is still reduced, I would suggest switching to an evidence based beta blocker to optimize heart failure therapy. I am hesitant to uptitrate lisinopril further at this time due to mild hyperkalemia.  Shortness of breath and atypical chest pain This is likely multifactorial and seems to be a chronic problem for the patient. I suspect that her recent worsening of dyspnea is due to acute blood loss anemia related to heavy menses. I encouraged to to continue with iron therapy and to follow-up with gynecology. If hysterectomy is needed, we would certainly like to obtain the aforementioned transthoracic echocardiogram and may also need to perform ischemia evaluation before proceeding with any elective surgery. Her chest pain is quite atypical; we will therefore defer ischemia testing until echo is been completed. I think she would also benefit significantly from weight loss.  Hypertension Blood pressure is well controlled today. Continue atenolol and lisinopril.  Mixed hyperlipidemia Patient is currently on atorvastatin 10 mg daily. Most recent lipid panel last month was notable for HDL 37 and LDL 77. No medication changes at this time.  Follow-up: Return to clinic in 1 month.  Nelva Bush, MD 06/06/2016 7:18 AM

## 2016-06-10 ENCOUNTER — Other Ambulatory Visit: Payer: Self-pay

## 2016-06-10 ENCOUNTER — Ambulatory Visit (INDEPENDENT_AMBULATORY_CARE_PROVIDER_SITE_OTHER): Payer: Medicare Other

## 2016-06-10 DIAGNOSIS — Z5181 Encounter for therapeutic drug level monitoring: Secondary | ICD-10-CM

## 2016-06-10 DIAGNOSIS — I5022 Chronic systolic (congestive) heart failure: Secondary | ICD-10-CM

## 2016-06-10 DIAGNOSIS — R0602 Shortness of breath: Secondary | ICD-10-CM | POA: Diagnosis not present

## 2016-06-10 DIAGNOSIS — Z79899 Other long term (current) drug therapy: Secondary | ICD-10-CM | POA: Diagnosis not present

## 2016-06-11 ENCOUNTER — Other Ambulatory Visit
Admission: RE | Admit: 2016-06-11 | Discharge: 2016-06-11 | Disposition: A | Discharge status: Home / Self Care | Payer: Medicare Other | Source: Ambulatory Visit | Attending: Internal Medicine | Admitting: Internal Medicine

## 2016-06-11 DIAGNOSIS — Z79899 Other long term (current) drug therapy: Secondary | ICD-10-CM | POA: Diagnosis not present

## 2016-06-11 DIAGNOSIS — Z5181 Encounter for therapeutic drug level monitoring: Secondary | ICD-10-CM | POA: Diagnosis not present

## 2016-06-11 DIAGNOSIS — I5022 Chronic systolic (congestive) heart failure: Secondary | ICD-10-CM

## 2016-06-11 DIAGNOSIS — R0602 Shortness of breath: Secondary | ICD-10-CM | POA: Insufficient documentation

## 2016-06-11 LAB — BASIC METABOLIC PANEL
Anion gap: 7 (ref 5–15)
BUN: 16 mg/dL (ref 6–20)
CALCIUM: 7.9 mg/dL — AB (ref 8.9–10.3)
CO2: 30 mmol/L (ref 22–32)
CREATININE: 0.86 mg/dL (ref 0.44–1.00)
Chloride: 102 mmol/L (ref 101–111)
GFR calc non Af Amer: >60 mL/min (ref >60)
Glucose, Bld: 161 mg/dL — ABNORMAL HIGH (ref 65–99)
Potassium: 4.1 mmol/L (ref 3.5–5.1)
Sodium: 139 mmol/L (ref 135–145)

## 2016-06-12 ENCOUNTER — Encounter: Payer: Self-pay | Admitting: Obstetrics and Gynecology

## 2016-06-12 ENCOUNTER — Ambulatory Visit (INDEPENDENT_AMBULATORY_CARE_PROVIDER_SITE_OTHER): Payer: Medicare Other | Admitting: Obstetrics and Gynecology

## 2016-06-12 VITALS — BP 173/78 | HR 90 | Ht 69.0 in | Wt 396.9 lb

## 2016-06-12 DIAGNOSIS — I110 Hypertensive heart disease with heart failure: Secondary | ICD-10-CM | POA: Diagnosis not present

## 2016-06-12 DIAGNOSIS — N939 Abnormal uterine and vaginal bleeding, unspecified: Secondary | ICD-10-CM | POA: Diagnosis not present

## 2016-06-12 DIAGNOSIS — E1122 Type 2 diabetes mellitus with diabetic chronic kidney disease: Secondary | ICD-10-CM

## 2016-06-12 DIAGNOSIS — I5022 Chronic systolic (congestive) heart failure: Secondary | ICD-10-CM | POA: Diagnosis not present

## 2016-06-12 DIAGNOSIS — N182 Chronic kidney disease, stage 2 (mild): Secondary | ICD-10-CM | POA: Diagnosis not present

## 2016-06-12 DIAGNOSIS — Z9889 Other specified postprocedural states: Secondary | ICD-10-CM

## 2016-06-12 DIAGNOSIS — Z794 Long term (current) use of insulin: Secondary | ICD-10-CM

## 2016-06-12 DIAGNOSIS — D500 Iron deficiency anemia secondary to blood loss (chronic): Secondary | ICD-10-CM | POA: Diagnosis not present

## 2016-06-12 MED ORDER — NORETHINDRONE ACETATE 5 MG PO TABS: 15.0000 mg | ORAL_TABLET | Freq: Every day | ORAL | 2 refills | Status: DC

## 2016-06-12 NOTE — Progress Notes (Signed)
GYN ENCOUNTER NOTE  Subjective:       Sara Zhang is a 46 y.o. [phone] female is here for gynecologic evaluation of the following issues:  1. Abnormal Uterine Bleeding    Sara Zhang presents today for heavy vaginal bleeding since late February 2018. States that she was standing in the kitchen cooking and felt hot sensation, went to the bathroom and passed a large grapefruit size clot along with a lot of blood. Bleeding has continued almost daily since along with passage of progressively smaller clots. She had been filling up 2 over night pads every 2 hours initially and today states that it is about one over night pad every 4 hours. She experienced a lot of cramping initially, but this has resolved. She had an endometrial ablation in 2015 and had not had any periods since until this event. Pathology from her hysteroscopy/D&C endometrial ablation was notable for endometrial polyps. Hemoglobin has dropped to a nadir of 8.1 g; she is taking iron supplementation. Her most recent hemoglobin 8.9   Gynecologic History Patient's last menstrual period was 04/03/2016 (approximate). Contraception: None Last Pap: Unknown Last mammogram: Unknown  Obstetric History OB History  Gravida Para Term Preterm AB Living  3 1 1   2 1   SAB TAB Ectopic Multiple Live Births    2     1    # Outcome Date GA Lbr Len/2nd Weight Sex Delivery Anes PTL Lv  3 Term 1994   8 lb 4.8 oz (3.765 kg) M CS-LTranv   LIV  2 TAB           1 TAB               Past Medical History:  Diagnosis Date  . CHF (congestive heart failure) ([place])   . Chronic kidney disease   . Diabetes mellitus without complication ([place])   . Extreme obesity   . Hyperlipidemia   . Hypertension   . Hypoxia   . Left heart failure ([place])   . Sleep apnea   . Vitamin D deficiency     Past Surgical History:  Procedure Laterality Date  . CESAREAN SECTION    . CHOLECYSTECTOMY    . TONSILLECTOMY AND ADENOIDECTOMY    . TOOTH EXTRACTION   01/2016    Current Outpatient Prescriptions on File Prior to Visit  Medication Sig Dispense Refill  . atenolol (TENORMIN) 25 MG tablet Take 1 tablet (25 mg total) by mouth daily. 90 tablet 1  . atorvastatin (LIPITOR) 10 MG tablet Take 1 tablet (10 mg total) by mouth daily. 90 tablet 1  . blood glucose meter kit and supplies KIT Dispense based on patient and insurance preference. Use up to four times daily as directed. (FOR ICD-9 250.00, 250.01). 1 each 0  . Exenatide ER (BYDUREON BCISE) 2 MG/0.85ML AUIJ Inject 2 mg into the skin once a week. 4 pen 12  . ferrous sulfate 325 (65 FE) MG tablet Take 1 tablet (325 mg total) by mouth daily with breakfast. 90 tablet 3  . furosemide (LASIX) 40 MG tablet Take 1 tablet (40 mg total) by mouth 2 (two) times daily. 180 tablet 3  . glipiZIDE (GLUCOTROL) 10 MG tablet Take 1 tablet (10 mg total) by mouth 2 (two) times daily before a meal. 180 tablet 1  . Insulin Glargine (TOUJEO SOLOSTAR) 300 UNIT/ML SOPN Inject 50 Units into the skin at bedtime. 6 pen 6  . lisinopril (PRINIVIL,ZESTRIL) 5 MG tablet Take 1 tablet (5 mg total)  by mouth daily. 90 tablet 1   No current facility-administered medications on file prior to visit.     No Known Allergies  Social History   Social History  . Marital status: Single    Spouse name: N/A  . Number of children: N/A  . Years of education: N/A   Occupational History  . Not on file.   Social History Main Topics  . Smoking status: Never Smoker  . Smokeless tobacco: Former Systems developer  . Alcohol use 0.0 oz/week     Comment: 1-2 beers/month  . Drug use: No  . Sexual activity: Yes    Birth control/ protection: None   Other Topics Concern  . Not on file   Social History Narrative  . No narrative on file    Family History  Problem Relation Age of Onset  . Diabetes Mother   . Hyperlipidemia Mother   . Hypertension Mother   . Stroke Mother   . Arthritis Father   . Asthma Father   . Diabetes Father   .  Hypertension Father   . Hypertension Brother   . Diabetes Brother   . Diabetes Maternal Grandmother   . Hypertension Maternal Grandmother   . Diabetes Maternal Grandfather   . Hypertension Maternal Grandfather   . Diabetes Paternal Grandmother   . Hypertension Paternal Grandmother   . Heart attack Maternal Uncle     The following portions of the patient's history were reviewed and updated as appropriate: allergies, current medications, past family history, past medical history, past social history, past surgical history and problem list.  Review of Systems Review of Systems  Constitutional: Positive for malaise/fatigue. Negative for chills and fever.  Gastrointestinal: Negative for abdominal pain, nausea and vomiting.  Genitourinary:       Positive for abnormal uterine bleeding, menorrhagia, passage of clots  Neurological: Negative for dizziness.    Objective:   BP (!) 173/78   Pulse 90   Ht 5' 9"  (1.753 m)   Wt (!) 396 lb 14.4 oz (180 kg)   LMP 04/03/2016 (Approximate)   BMI 58.61 kg/m  CONSTITUTIONAL: Well-developed, well-nourished female in no acute distress. Morbidly obese HENT:  Normocephalic, atraumatic.  NECK: Not examined SKIN: Skin is warm and dry.  [place]: Alert  PSYCHIATRIC: Normal mood and affect. Normal behavior. Normal judgment and thought content. CARDIOVASCULAR:Not Examined RESPIRATORY: Not Examined BREASTS: Not Examined ABDOMEN: Soft, Non tender.  Large pannus; no palpable masses PELVIC:  External Genitalia: Normal  BUS: Normal  Vagina: Normal; no lesions  Cervix: Normal; no lesions; no discharge  Uterus: Not palpable due to body habitus  Adnexa: Not palpable due to body habitus  RV: Normal external exam  Bladder: Nontender MUSCULOSKELETAL: Not examined   PROCEDURE:Endometrial Biopsy Procedure Note  Pre-operative Diagnosis: Abnormal uterine bleeding; status post endometrial ablation  Post-operative Diagnosis: Same as above  Procedure  Details   Urine pregnancy test was not done.  The risks (including infection, bleeding, pain, and uterine perforation) and benefits of the procedure were explained to the patient and Verbal informed consent was obtained.  Antibiotic prophylaxis against endocarditis was not indicated.   The patient was placed in the dorsal lithotomy position.  Bimanual exam showed the uterus to be in the neutral position.  A Graves' speculum inserted in the vagina, and the cervix prepped with povidone iodine.  Endocervical curettage with a Kevorkian curette was not performed.   A sharp tenaculum was applied to the anterior lip of the cervix for stabilization.  A sterile  uterine sound was used to sound the uterus to a depth of 10.5 cm.  A Mylex 60m curette was used to sample the endometrium.  Sample was sent for pathologic examination.  Condition: Stable  Complications: None  Plan:  The patient was advised to call for any fever or for prolonged or severe pain or bleeding. She was advised to use OTC acetaminophen as needed for mild to moderate pain. She was advised to avoid vaginal intercourse for 48 hours or until the bleeding has completely stopped.  Attending Physician Documentation: MBrayton Mars MD   Assessment:   1. Abnormal Uterine Bleeding, Recent onset 2. S/P endometrial ablation- Jan 2015 3. Obesity- BMI- 58 4. History of endometrial polyps on D&C specimen  Plan:   1. Endometrial biopsy today 2. Schedule pelvic [place]. Start Norethindrone acetate 15 mg a day to stop bleeding 4. Return in 10 days for follow-up    KAliene Altes PA-S EFarrell5/10/2016  MBrayton Mars MD   I have seen, interviewed, and examined the patient in conjunction with the [facility] student and affirm the diagnosis and management plan. Lawerance Matsuo A. Ahyan Kreeger, MD, FACOG   Note: This dictation was prepared with Dragon dictation along with smaller phrase technology. Any  transcriptional errors that result from this process are unintentional.

## 2016-06-12 NOTE — Patient Instructions (Signed)
1. Endometrial biopsy is done today 2. Pelvic ultrasound is scheduled 3. Return in 10 days for follow-up 4. Begin taking norethindrone acetate 15 mg a day to help stop bleeding   Endometrial Biopsy, Care After This sheet gives you information about how to care for yourself after your procedure. Your health care provider may also give you more specific instructions. If you have problems or questions, contact your health care provider. What can I expect after the procedure? After the procedure, it is common to have:  Mild cramping.  A small amount of vaginal bleeding for a few days. This is normal. Follow these instructions at home:  Take over-the-counter and prescription medicines only as told by your health care provider.  Do not douche, use tampons, or have sexual intercourse until your health care provider approves.  Return to your normal activities as told by your health care provider. Ask your health care provider what activities are safe for you.  Follow instructions from your health care provider about any activity restrictions, such as restrictions on strenuous exercise or heavy lifting. Contact a health care provider if:  You have heavy bleeding, or bleed for longer than 2 days after the procedure.  You have bad smelling discharge from your vagina.  You have a fever or chills.  You have a burning sensation when urinating or you have difficulty urinating.  You have severe pain in your lower abdomen. Get help right away if:  You have severe cramps in your stomach or back.  You pass large blood clots.  Your bleeding increases.  You become weak or light-headed, or you pass out. Summary  After the procedure, it is common to have mild cramping and a small amount of vaginal bleeding for a few days.  Do not douche, use tampons, or have sexual intercourse until your health care provider approves.  Return to your normal activities as told by your health care provider.  Ask your health care provider what activities are safe for you. This information is not intended to replace advice given to you by your health care provider. Make sure you discuss any questions you have with your health care provider. Document Released: 11/11/2012 Document Revised: 02/07/2016 Document Reviewed: 02/07/2016 Elsevier Interactive Patient Education  2017 [employer].

## 2016-06-13 ENCOUNTER — Telehealth: Payer: Self-pay | Admitting: Unknown Physician Specialty

## 2016-06-13 NOTE — Telephone Encounter (Signed)
Pt returning our call, she received a phone call about her results on echo she did this week.

## 2016-06-13 NOTE — Telephone Encounter (Signed)
Results of lab work and echo given to patient and she verbalized understanding of treatment and plan. See result notes.

## 2016-06-14 LAB — PATHOLOGY

## 2016-06-20 ENCOUNTER — Ambulatory Visit (INDEPENDENT_AMBULATORY_CARE_PROVIDER_SITE_OTHER): Payer: Medicare Other | Admitting: Obstetrics and Gynecology

## 2016-06-20 ENCOUNTER — Other Ambulatory Visit: Payer: Self-pay | Admitting: Obstetrics and Gynecology

## 2016-06-20 ENCOUNTER — Ambulatory Visit
Admission: RE | Admit: 2016-06-20 | Discharge: 2016-06-20 | Disposition: A | Discharge status: Home / Self Care | Payer: Medicare Other | Source: Ambulatory Visit | Attending: Obstetrics and Gynecology | Admitting: Obstetrics and Gynecology

## 2016-06-20 ENCOUNTER — Encounter: Payer: Self-pay | Admitting: Obstetrics and Gynecology

## 2016-06-20 ENCOUNTER — Ambulatory Visit: Payer: Medicare Other

## 2016-06-20 VITALS — BP 183/78 | HR 99 | Ht 69.0 in | Wt 396.9 lb

## 2016-06-20 DIAGNOSIS — I110 Hypertensive heart disease with heart failure: Secondary | ICD-10-CM | POA: Diagnosis not present

## 2016-06-20 DIAGNOSIS — E1122 Type 2 diabetes mellitus with diabetic chronic kidney disease: Secondary | ICD-10-CM

## 2016-06-20 DIAGNOSIS — Z794 Long term (current) use of insulin: Secondary | ICD-10-CM

## 2016-06-20 DIAGNOSIS — Z9889 Other specified postprocedural states: Secondary | ICD-10-CM | POA: Diagnosis not present

## 2016-06-20 DIAGNOSIS — N939 Abnormal uterine and vaginal bleeding, unspecified: Secondary | ICD-10-CM | POA: Diagnosis not present

## 2016-06-20 DIAGNOSIS — R9389 Abnormal findings on diagnostic imaging of other specified body structures: Secondary | ICD-10-CM

## 2016-06-20 DIAGNOSIS — I5022 Chronic systolic (congestive) heart failure: Secondary | ICD-10-CM

## 2016-06-20 DIAGNOSIS — N888 Other specified noninflammatory disorders of cervix uteri: Secondary | ICD-10-CM | POA: Diagnosis not present

## 2016-06-20 DIAGNOSIS — N182 Chronic kidney disease, stage 2 (mild): Secondary | ICD-10-CM

## 2016-06-20 DIAGNOSIS — R938 Abnormal findings on diagnostic imaging of other specified body structures: Secondary | ICD-10-CM | POA: Diagnosis not present

## 2016-06-20 HISTORY — DX: Abnormal findings on diagnostic imaging of other specified body structures: R93.89

## 2016-06-20 HISTORY — DX: Other specified postprocedural states: Z98.890

## 2016-06-20 NOTE — Progress Notes (Signed)
Chief complaint: 1. Abnormal uterine bleeding, status post endometrial ablation 2. Follow-up on pelvic ultrasound  The patient is a 46 year old para 10-1, status post NovaSure endometrial ablation in 2015, with pathology from endometrial sampling notable for endometrial polyps at that time, presents now for further management of new onset abnormal uterine bleeding. Vaginal bleeding started late in February 2018. She states that she was standing in the kitchen cooking and felt that hot sensation; went to the bathroom and passed large grapefruit size clot along with significant bleeding. The bleeding has continued almost daily since that time with passage of progressively smaller clots. She had been filling up to overnight pads every 2 hours initially and most recently she was filling about 1 overnight pad every 4 hours. Patient initially had significant cramping; however, this has since resolved.  Patient had pelvic ultrasound earlier today at [facility]. She reports having minimal bleeding today.  Past Medical History:  Diagnosis Date  . CHF (congestive heart failure) ([place])   . Chronic kidney disease   . Diabetes mellitus without complication ([place])   . Extreme obesity   . Hyperlipidemia   . Hypertension   . Hypoxia   . Left heart failure ([place])   . Sleep apnea   . Vitamin D deficiency    Past Surgical History:  Procedure Laterality Date  . CESAREAN SECTION    . CHOLECYSTECTOMY    . TONSILLECTOMY AND ADENOIDECTOMY    . TOOTH EXTRACTION  01/2016   Current Outpatient Prescriptions on File Prior to Visit  Medication Sig Dispense Refill  . atenolol (TENORMIN) 25 MG tablet Take 1 tablet (25 mg total) by mouth daily. 90 tablet 1  . atorvastatin (LIPITOR) 10 MG tablet Take 1 tablet (10 mg total) by mouth daily. 90 tablet 1  . blood glucose meter kit and supplies KIT Dispense based on patient and insurance preference. Use up to four times daily as directed. (FOR ICD-9 250.00, 250.01). 1 each 0  .  Exenatide ER (BYDUREON BCISE) 2 MG/0.85ML AUIJ Inject 2 mg into the skin once a week. 4 pen 12  . ferrous sulfate 325 (65 FE) MG tablet Take 1 tablet (325 mg total) by mouth daily with breakfast. 90 tablet 3  . furosemide (LASIX) 40 MG tablet Take 1 tablet (40 mg total) by mouth 2 (two) times daily. 180 tablet 3  . glipiZIDE (GLUCOTROL) 10 MG tablet Take 1 tablet (10 mg total) by mouth 2 (two) times daily before a meal. 180 tablet 1  . Insulin Glargine (TOUJEO SOLOSTAR) 300 UNIT/ML SOPN Inject 50 Units into the skin at bedtime. 6 pen 6  . lisinopril (PRINIVIL,ZESTRIL) 5 MG tablet Take 1 tablet (5 mg total) by mouth daily. 90 tablet 1  . norethindrone (AYGESTIN) 5 MG tablet Take 3 tablets (15 mg total) by mouth daily. 90 tablet 2   No current facility-administered medications on file prior to visit.    Patient Active Problem List   Diagnosis Date Noted  . Iron deficiency anemia 05/27/2016  . Type 2 diabetes mellitus with chronic kidney disease ([place]) 04/25/2015  . Hypocalcemia 11/10/2014  . Hyperlipidemia 11/10/2014  . Sleep apnea 11/10/2014  . Hypoxia 11/10/2014  . Extreme obesity 11/10/2014  . Vitamin D deficiency 11/10/2014  . CKD (chronic kidney disease), stage II 11/10/2014  . Left heart failure ([place]) 11/10/2014  . Hypertensive heart disease with congestive heart failure ([place]) 11/10/2014   OBJECTIVE: BP (!) 183/78   Pulse 99   Ht _0  (1.753 m)  Wt (!) 396 lb 14.4 oz (180 kg)   BMI 58.61 kg/m  Physical exam-deferred  CBC Latest Ref Rng & Units 05/29/2016 05/21/2016 12/16/2015  WBC 3.4 - 10.8 x10E3/uL 9.9 9.2 7.5  Hemoglobin 12.0 - 16.0 g/dL - - 10.6(L)  Hematocrit 34.0 - 46.6 % 31.7(L) 27.7(L) 33.7(L)  Platelets 150 - 379 x10E3/uL 574(H) 442(H) 346    US Transvaginal Non-OB (Accession [id number]) (Order [id number])  Imaging  Date: 06/20/2016 Department: [facility] Released By/Authorizing: Ervey Fallin, Alanda Slim, MD  Exam Information   Status Exam Begun  Exam  Ended   Final [99] 06/20/2016 9:21 AM 06/20/2016 10:12 AM  PACS Images   Show images for US Transvaginal Non-OB  Study Result   CLINICAL DATA:  Abnormal uterine bleeding. Prior uterine ablation. Morbid obesity.  EXAM: TRANSABDOMINAL AND TRANSVAGINAL ULTRASOUND OF PELVIS  TECHNIQUE: Both transabdominal and transvaginal ultrasound examinations of the pelvis were performed. Transabdominal technique was performed for global imaging of the pelvis including uterus, ovaries, adnexal regions, and pelvic cul-de-sac. It was necessary to proceed with endovaginal exam following the transabdominal exam to visualize the uterus and ovaries.  COMPARISON:  CT 05/16/2011.  FINDINGS: Uterus  Measurements: 15.7 x 9.2 x 2.5 cm. No fibroids or other mass visualized. Nabothian cysts noted.  Endometrium  Thickness: 22.3 mm. Heterogeneous appearance with echogenic foci consistent calcifications.  Right ovary  Measurements: 4.4 x 2.9 x 2.8 cm. Normal appearance/no adnexal mass.  Left ovary  Not visualized  Other findings  No abnormal free fluid.  Limited exam due to patient's body habitus.  IMPRESSION: Limited exam due to patient's body habitus. The endometrium is thickened at 22.3 mm. Endometrium is heterogeneous with echogenic foci consistent with calcification. If bleeding remains unresponsive to hormonal or medical therapy, focal lesion work-up with sonohysterogram should be considered. Endometrial biopsy should also be considered in pre-menopausal patients at high risk for endometrial carcinoma. (Ref: Radiological Reasoning: Algorithmic Workup of Abnormal Vaginal Bleeding with Endovaginal Sonography and Sonohysterography. AJR 2008; 355:H74-16)   Electronically Signed   By: Marcello Moores  Register   On: 06/20/2016 10:17    ASSESSMENT: 1. Recurrent menorrhagia to anemia, onset February 2018 to present 2. Thickened endometrium, heterogenous, 22.3 mm endometrial  stripe on ultrasound 2.  Status post hysteroscopy/D&C with NovaSure endometrial ablation in 2015 3.  Morbid obesity with BMI of 58.6 (this exceeds OR anesthesia limit at this hospital of 50 BMI) 4. Multiple comorbidities including congestive heart failure, type 2 diabetes mellitus, chronic kidney disease stage II  PLAN: 1. Patient needs endometrial sampling and probable hysteroscopy/D&C 2. Due to extreme obesity with BMI greater than 50 and multiple comorbidities, referral to [facility] for high risk evaluation and management.  A total of 15 minutes were spent face-to-face with the patient during this encounter and over half of that time dealt with counseling and coordination of care.  Brayton Mars, MD  Note: This dictation was prepared with Dragon dictation along with smaller phrase technology. Any transcriptional errors that result from this process are unintentional.

## 2016-06-20 NOTE — Patient Instructions (Signed)
1. Referral to [facility] for hysteroscopy/D&C will be made because of high BMI-58 (anesthesia restrictions)

## 2016-07-24 ENCOUNTER — Encounter: Payer: Self-pay | Admitting: Internal Medicine

## 2016-07-24 ENCOUNTER — Ambulatory Visit (INDEPENDENT_AMBULATORY_CARE_PROVIDER_SITE_OTHER): Payer: Medicare Other | Admitting: Internal Medicine

## 2016-07-24 VITALS — BP 140/74 | HR 90 | Ht 69.0 in | Wt 385.3 lb

## 2016-07-24 DIAGNOSIS — Z79899 Other long term (current) drug therapy: Secondary | ICD-10-CM

## 2016-07-24 DIAGNOSIS — R0602 Shortness of breath: Secondary | ICD-10-CM | POA: Diagnosis not present

## 2016-07-24 DIAGNOSIS — I119 Hypertensive heart disease without heart failure: Secondary | ICD-10-CM

## 2016-07-24 DIAGNOSIS — R0789 Other chest pain: Secondary | ICD-10-CM

## 2016-07-24 MED ORDER — LISINOPRIL 10 MG PO TABS: 10.0000 mg | ORAL_TABLET | Freq: Every day | ORAL | 3 refills | Status: DC

## 2016-07-24 MED ORDER — FUROSEMIDE 40 MG PO TABS: 40.0000 mg | ORAL_TABLET | Freq: Every day | ORAL | Status: DC

## 2016-07-24 NOTE — Patient Instructions (Signed)
Medication Instructions:  Your physician has recommended you make the following change in your medication:  1- INCREASE Lisinopril to 10 mg  By mouth once a day.   Labwork: Your physician recommends that you return for lab work in: 2 WEEKS ON 08/08/16 AT [facility]. (B[facility]) - Please go to the [facility]. You will check in at the front desk to the right as you walk into the atrium. Valet Parking is offered if needed.     Testing/Procedures: NONE  Follow-Up: Your physician recommends that you schedule a follow-up appointment in: 3 MONTHS WITH DR END.  If you need a refill on your cardiac medications before your next appointment, please call your pharmacy.

## 2016-07-24 NOTE — Progress Notes (Signed)
Follow-up Outpatient Visit Date: 07/24/2016  Primary Care Provider: Kathrine Haddock, NP [address] [address]  Chief Complaint: Follow-up heart failure  HPI:  Sara Zhang is a 46 y.o. year-old female with history of systolic heart failure (LVEF reportedly 25-35% in 2014), hypertension, hyperlipidemia, and type 2 diabetes mellitus, who presents for follow-up of shortness of breath with history of heart failure. I last saw her on 06/05/16, at which time she reported shortness of breath and atypical chest pain in the setting of heavy menses she had not had any cardiology evaluation for several years. We agreed to obtain a transthoracic echocardiogram, which showed normal LV function with mild MR.  Today, [name] reports that she is feeling better than on her last visit. She has not had any further episodes of chest pain. Her breathing and oxygen levels have also improved. She has also noted resolution of her leg swelling, prompting her to decrease furosemide back to 40 mg once a day.she continues to wear supplemental oxygen during the day when she is active as well as CPAP at night. She has lost 10-12 pounds since our last visit. She denies palpitations, lightheadedness,and claudication.Her vaginal bleeding has improved with medical therapy. She is awaiting referral to [facility] for consideration of repeat endometrial ablation.  --------------------------------------------------------------------------------------------------  Cardiovascular History & Procedures: Cardiovascular Problems:  Chronic systolic heart failure with normalization of LVEF  Risk Factors:  Hypertension, hyperlipidemia, diabetes mellitus, and obesity  Cath/PCI:  None  CV Surgery:  None  EP Procedures and Devices:  None  Non-Invasive Evaluation(s):  TTE (06/10/16): Normal LV size and function with LVEF of 55-60%. Normal diastolic parameters. Mild MR. Normal RV size and function. Normal pulmonary artery  pressure.  Report of LVEF 25-35% with global hypokinesis by outside echo on her before 03/04/12  Recent CV Pertinent Labs: Lab Results  Component Value Date   CHOL 132 05/21/2016   CHOL 137 08/02/2015   HDL 37 (L) 05/21/2016   LDLCALC 77 05/21/2016   TRIG 89 05/21/2016   TRIG 80 08/02/2015   BNP 51.0 12/16/2015   K 4.1 06/11/2016   K 4.2 09/25/2013   BUN 16 06/11/2016   BUN 20 05/21/2016   BUN 12 09/25/2013   CREATININE 0.86 06/11/2016   CREATININE 0.75 09/25/2013    Past medical and surgical history were reviewed and updated in EPIC.  Outpatient Encounter Prescriptions as of 07/24/2016  Medication Sig  . atenolol (TENORMIN) 25 MG tablet Take 1 tablet (25 mg total) by mouth daily.  Marland Kitchen atorvastatin (LIPITOR) 10 MG tablet Take 1 tablet (10 mg total) by mouth daily.  . blood glucose meter kit and supplies KIT Dispense based on patient and insurance preference. Use up to four times daily as directed. (FOR ICD-9 250.00, 250.01).  Marland Kitchen Exenatide ER (BYDUREON BCISE) 2 MG/0.85ML AUIJ Inject 2 mg into the skin once a week.  . ferrous sulfate 325 (65 FE) MG tablet Take 1 tablet (325 mg total) by mouth daily with breakfast.  . furosemide (LASIX) 40 MG tablet Take 1 tablet (40 mg total) by mouth 2 (two) times daily.  Marland Kitchen glipiZIDE (GLUCOTROL) 10 MG tablet Take 1 tablet (10 mg total) by mouth 2 (two) times daily before a meal.  . Insulin Glargine (TOUJEO SOLOSTAR) 300 UNIT/ML SOPN Inject 50 Units into the skin at bedtime.  Marland Kitchen lisinopril (PRINIVIL,ZESTRIL) 5 MG tablet Take 1 tablet (5 mg total) by mouth daily.  . norethindrone (AYGESTIN) 5 MG tablet Take 3 tablets (15 mg total)  by mouth daily.   No facility-administered encounter medications on file as of 07/24/2016.     Allergies: Patient has no known allergies.  Social History   Social History  . Marital status: Single    Spouse name: N/A  . Number of children: N/A  . Years of education: N/A   Occupational History  . Not on file.    Social History Main Topics  . Smoking status: Never Smoker  . Smokeless tobacco: Former Systems developer  . Alcohol use 0.0 oz/week     Comment: 1-2 beers/month  . Drug use: No  . Sexual activity: Yes    Birth control/ protection: None   Other Topics Concern  . Not on file   Social History Narrative  . No narrative on file    Family History  Problem Relation Age of Onset  . Diabetes Mother   . Hyperlipidemia Mother   . Hypertension Mother   . Stroke Mother   . Arthritis Father   . Asthma Father   . Diabetes Father   . Hypertension Father   . Hypertension Brother   . Diabetes Brother   . Diabetes Maternal Grandmother   . Hypertension Maternal Grandmother   . Diabetes Maternal Grandfather   . Hypertension Maternal Grandfather   . Diabetes Paternal Grandmother   . Hypertension Paternal Grandmother   . Heart attack Maternal Uncle     Review of Systems: A 12-system review of systems was performed and was negative except as noted in the HPI.  --------------------------------------------------------------------------------------------------  Physical Exam: BP 140/74 (BP Location: Left Arm, Patient Position: Sitting, Cuff Size: Large)   Pulse 90   Ht 5' 9"  (1.753 m)   Wt (!) 385 lb 5 oz (174.8 kg)   SpO2 97%   BMI 56.90 kg/m   General:  Morbidly obese woman, seated comfortably in the exam room. HEENT: No conjunctival pallor or scleral icterus.  Moist mucous membranes.  OP clear. Neck: Supple without lymphadenopathy, or thyromegaly. No obvious JVD or HJR, though body habitus limits evaluation. Lungs: Normal work of breathing.  Clear to auscultation bilaterally without wheezes or crackles. Heart: Regular rate and rhythm with 2/6 holosystolic murmur loudest at the left upper sternal border. No rubs or gallops. Unable to assess PMI due to body habitus. Abd: Bowel sounds present.  Obese, soft, nontender, nondistended. Unable to assess HSM due to body habitus. Ext: Trace pretibial  edema bilaterally.  Radial, PT, and DP pulses are 2+ bilaterally. Skin: Warm and dry without rash  Lab Results  Component Value Date   WBC 9.9 05/29/2016   HGB 8.9 (L) 05/29/2016   HCT 31.7 (L) 05/29/2016   MCV 79 05/29/2016   PLT 574 (H) 05/29/2016    Lab Results  Component Value Date   NA 139 06/11/2016   K 4.1 06/11/2016   CL 102 06/11/2016   CO2 30 06/11/2016   BUN 16 06/11/2016   CREATININE 0.86 06/11/2016   GLUCOSE 161 (H) 06/11/2016   ALT 10 05/21/2016    Lab Results  Component Value Date   CHOL 132 05/21/2016   HDL 37 (L) 05/21/2016   LDLCALC 77 05/21/2016   TRIG 89 05/21/2016    --------------------------------------------------------------------------------------------------  ASSESSMENT AND PLAN: Hypertensive heart disease Ms. Forgione's volume status appears improved today. She has trace leg edema but otherwise no signs or symptoms of significant heart failure. Echocardiogram since her last visit demonstrated normal LV systolic and diastolic function with mild MR. Her blood pressure is mildly elevated today. We  will therefore increase lisinopril to 10 mg daily. Otherwise, she should continue her current medications, including furosemide 40 mg daily. I will have Ms. Spark return in about 2 weeks for a basic metabolic panel, given uptitration of lisinopril.  Atypical chest pain and shortness of breath Symptoms have improved since her last visit, with no further episodes of chest pain. We will continue medical therapy, as above.  Follow-up: Return to clinic in 3 months.  Nelva Bush, MD 07/24/2016 8:48 AM

## 2016-08-08 ENCOUNTER — Other Ambulatory Visit
Admission: RE | Admit: 2016-08-08 | Discharge: 2016-08-08 | Disposition: A | Discharge status: Home / Self Care | Payer: Medicare Other | Source: Ambulatory Visit | Attending: Internal Medicine | Admitting: Internal Medicine

## 2016-08-08 ENCOUNTER — Ambulatory Visit (INDEPENDENT_AMBULATORY_CARE_PROVIDER_SITE_OTHER): Payer: Medicare Other

## 2016-08-08 VITALS — BP 138/82 | HR 74 | Temp 98.7°F | Resp 16 | Ht 69.0 in | Wt 387.4 lb

## 2016-08-08 DIAGNOSIS — Z0000 Encounter for general adult medical examination without abnormal findings: Secondary | ICD-10-CM | POA: Diagnosis not present

## 2016-08-08 DIAGNOSIS — Z79899 Other long term (current) drug therapy: Secondary | ICD-10-CM | POA: Diagnosis not present

## 2016-08-08 DIAGNOSIS — I119 Hypertensive heart disease without heart failure: Secondary | ICD-10-CM | POA: Diagnosis not present

## 2016-08-08 LAB — BASIC METABOLIC PANEL
ANION GAP: 9 (ref 5–15)
BUN: 17 mg/dL (ref 6–20)
CALCIUM: 8.2 mg/dL — AB (ref 8.9–10.3)
CO2: 25 mmol/L (ref 22–32)
CREATININE: 1.05 mg/dL — AB (ref 0.44–1.00)
Chloride: 106 mmol/L (ref 101–111)
GFR calc non Af Amer: >60 mL/min (ref >60)
Glucose, Bld: 172 mg/dL — ABNORMAL HIGH (ref 65–99)
Potassium: 3.8 mmol/L (ref 3.5–5.1)
SODIUM: 140 mmol/L (ref 135–145)

## 2016-08-08 NOTE — Patient Instructions (Signed)
Sara Zhang , Thank you for taking time to come for your Medicare Wellness Visit. I appreciate your ongoing commitment to your health goals. Please review the following plan we discussed and let me know if I can assist you in the future.   Screening recommendations/referrals: Colonoscopy: Due at age 48 Mammogram: Due now Bone Density: Due at age 42 Recommended yearly ophthalmology/optometry visit for glaucoma screening and checkup Recommended yearly dental visit for hygiene and checkup  Vaccinations: Influenza vaccine: Due 10/2016 Pneumococcal vaccine: Due at age 43 Tdap vaccine: Up to date Shingles vaccine: Due at age 74  Advanced directives: Advance directive discussed with you today. I have provided a copy for you to complete at home and have notarized. Once this is complete please bring a copy in to our office so we can scan it into your chart.  Conditions/risks identified: Recommend drinking at least 4-5 glasses of water a day  Next appointment: Follow up on 08/21/2016 at 8:30am with Regino Schultze. Follow up in one year for your annual wellness exam.   Preventive Care 40-64 Years, Female Preventive care refers to lifestyle choices and visits with your health care provider that can promote health and wellness. What does preventive care include?  A yearly physical exam. This is also called an annual well check.  Dental exams once or twice a year.  Routine eye exams. Ask your health care provider how often you should have your eyes checked.  Personal lifestyle choices, including:  Daily care of your teeth and gums.  Regular physical activity.  Eating a healthy diet.  Avoiding tobacco and drug use.  Limiting alcohol use.  Practicing safe sex.  Taking low-dose aspirin daily starting at age 49.  Taking vitamin and mineral supplements as recommended by your health care provider. What happens during an annual well check? The services and screenings done by your health  care provider during your annual well check will depend on your age, overall health, lifestyle risk factors, and family history of disease. Counseling  Your health care provider may ask you questions about your:  Alcohol use.  Tobacco use.  Drug use.  Emotional well-being.  Home and relationship well-being.  Sexual activity.  Eating habits.  Work and work Statistician.  Method of birth control.  Menstrual cycle.  Pregnancy history. Screening  You may have the following tests or measurements:  Height, weight, and BMI.  Blood pressure.  Lipid and cholesterol levels. These may be checked every 5 years, or more frequently if you are over 2 years old.  Skin check.  Lung cancer screening. You may have this screening every year starting at age 74 if you have a 30-pack-year history of smoking and currently smoke or have quit within the past 15 years.  Fecal occult blood test (FOBT) of the stool. You may have this test every year starting at age 52.  Flexible sigmoidoscopy or colonoscopy. You may have a sigmoidoscopy every 5 years or a colonoscopy every 10 years starting at age 4.  Hepatitis C blood test.  Hepatitis B blood test.  Sexually transmitted disease (STD) testing.  Diabetes screening. This is done by checking your blood sugar (glucose) after you have not eaten for a while (fasting). You may have this done every 1-3 years.  Mammogram. This may be done every 1-2 years. Talk to your health care provider about when you should start having regular mammograms. This may depend on whether you have a family history of breast cancer.  BRCA-related cancer screening.  This may be done if you have a family history of breast, ovarian, tubal, or peritoneal cancers.  Pelvic exam and Pap test. This may be done every 3 years starting at age 90. Starting at age 60, this may be done every 5 years if you have a Pap test in combination with an HPV test.  Bone density scan. This is  done to screen for osteoporosis. You may have this scan if you are at high risk for osteoporosis. Discuss your test results, treatment options, and if necessary, the need for more tests with your health care provider. Vaccines  Your health care provider may recommend certain vaccines, such as:  Influenza vaccine. This is recommended every year.  Tetanus, diphtheria, and acellular pertussis (Tdap, Td) vaccine. You may need a Td booster every 10 years.  Zoster vaccine. You may need this after age 79.  Pneumococcal 13-valent conjugate (PCV13) vaccine. You may need this if you have certain conditions and were not previously vaccinated.  Pneumococcal polysaccharide (PPSV23) vaccine. You may need one or two doses if you smoke cigarettes or if you have certain conditions. Talk to your health care provider about which screenings and vaccines you need and how often you need them. This information is not intended to replace advice given to you by your health care provider. Make sure you discuss any questions you have with your health care provider. Document Released: 02/17/2015 Document Revised: 10/11/2015 Document Reviewed: 11/22/2014 Elsevier Interactive Patient Education  2017 [place] Prevention in the Home Falls can cause injuries. They can happen to people of all ages. There are many things you can do to make your home safe and to help prevent falls. What can I do on the outside of my home?  Regularly fix the edges of walkways and driveways and fix any cracks.  Remove anything that might make you trip as you walk through a door, such as a raised step or threshold.  Trim any bushes or trees on the path to your home.  Use bright outdoor lighting.  Clear any walking paths of anything that might make someone trip, such as rocks or tools.  Regularly check to see if handrails are loose or broken. Make sure that both sides of any steps have handrails.  Any raised decks and  porches should have guardrails on the edges.  Have any leaves, snow, or ice cleared regularly.  Use sand or salt on walking paths during winter.  Clean up any spills in your garage right away. This includes oil or grease spills. What can I do in the bathroom?  Use night lights.  Install grab bars by the toilet and in the tub and shower. Do not use towel bars as grab bars.  Use non-skid mats or decals in the tub or shower.  If you need to sit down in the shower, use a plastic, non-slip stool.  Keep the floor dry. Clean up any water that spills on the floor as soon as it happens.  Remove soap buildup in the tub or shower regularly.  Attach bath mats securely with double-sided non-slip rug tape.  Do not have throw rugs and other things on the floor that can make you trip. What can I do in the bedroom?  Use night lights.  Make sure that you have a light by your bed that is easy to reach.  Do not use any sheets or blankets that are too big for your bed. They should not hang  down onto the floor.  Have a firm chair that has side arms. You can use this for support while you get dressed.  Do not have throw rugs and other things on the floor that can make you trip. What can I do in the kitchen?  Clean up any spills right away.  Avoid walking on wet floors.  Keep items that you use a lot in easy-to-reach places.  If you need to reach something above you, use a strong step stool that has a grab bar.  Keep electrical cords out of the way.  Do not use floor polish or wax that makes floors slippery. If you must use wax, use non-skid floor wax.  Do not have throw rugs and other things on the floor that can make you trip. What can I do with my stairs?  Do not leave any items on the stairs.  Make sure that there are handrails on both sides of the stairs and use them. Fix handrails that are broken or loose. Make sure that handrails are as long as the stairways.  Check any  carpeting to make sure that it is firmly attached to the stairs. Fix any carpet that is loose or worn.  Avoid having throw rugs at the top or bottom of the stairs. If you do have throw rugs, attach them to the floor with carpet tape.  Make sure that you have a light switch at the top of the stairs and the bottom of the stairs. If you do not have them, ask someone to add them for you. What else can I do to help prevent falls?  Wear shoes that:  Do not have high heels.  Have rubber bottoms.  Are comfortable and fit you well.  Are closed at the toe. Do not wear sandals.  If you use a stepladder:  Make sure that it is fully opened. Do not climb a closed stepladder.  Make sure that both sides of the stepladder are locked into place.  Ask someone to hold it for you, if possible.  Clearly mark and make sure that you can see:  Any grab bars or handrails.  First and last steps.  Where the edge of each step is.  Use tools that help you move around (mobility aids) if they are needed. These include:  Canes.  Walkers.  Scooters.  Crutches.  Turn on the lights when you go into a dark area. Replace any light bulbs as soon as they burn out.  Set up your furniture so you have a clear path. Avoid moving your furniture around.  If any of your floors are uneven, fix them.  If there are any pets around you, be aware of where they are.  Review your medicines with your doctor. Some medicines can make you feel dizzy. This can increase your chance of falling. Ask your doctor what other things that you can do to help prevent falls. This information is not intended to replace advice given to you by your health care provider. Make sure you discuss any questions you have with your health care provider. Document Released: 11/17/2008 Document Revised: 06/29/2015 Document Reviewed: 02/25/2014 Elsevier Interactive Patient Education  2017 [employer].

## 2016-08-08 NOTE — Progress Notes (Signed)
Subjective:   Sara Zhang is a 46 y.o. female who presents for Medicare Annual (Subsequent) preventive examination.  Review of Systems:   Cardiac Risk Factors include: hypertension;dyslipidemia;obesity (BMI >30kg/m2);diabetes mellitus     Objective:     Vitals: BP 138/82 (BP Location: Right Arm, Patient Position: Sitting)   Pulse 74   Temp 98.7 F (37.1 C)   Resp 16   Ht 5' 9"  (1.753 m)   Wt (!) 387 lb 6.4 oz (175.7 kg)   BMI 57.21 kg/m   Body mass index is 57.21 kg/m.   Tobacco History  Smoking Status  . Never Smoker  Smokeless Tobacco  . 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 CHF (congestive heart failure) ([place])   . Chronic kidney disease   . Diabetes mellitus without complication ([place])   . Extreme obesity   . Hyperlipidemia   . Hypertension   . Hypoxia   . Left heart failure ([place])   . Sleep apnea   . Vitamin D deficiency    Past Surgical History:  Procedure Laterality Date  . CESAREAN SECTION    . CHOLECYSTECTOMY    . TONSILLECTOMY AND ADENOIDECTOMY    . TOOTH EXTRACTION  01/2016   Family History  Problem Relation Age of Onset  . Diabetes Mother   . Hyperlipidemia Mother   . Hypertension Mother   . Stroke Mother   . Arthritis Father   . Asthma Father   . Diabetes Father   . Hypertension Father   . Hypertension Brother   . Diabetes Brother   . Diabetes Maternal Grandmother   . Hypertension Maternal Grandmother   . Diabetes Maternal Grandfather   . Hypertension Maternal Grandfather   . Diabetes Paternal Grandmother   . Hypertension Paternal Grandmother   . Heart attack Maternal Uncle    History  Sexual Activity  . Sexual activity: Yes  . Birth control/ protection: None    Outpatient Encounter Prescriptions as of 08/08/2016  Medication Sig  . atenolol (TENORMIN) 25 MG tablet Take 1 tablet (25 mg total) by mouth daily.  Marland Kitchen atorvastatin (LIPITOR) 10 MG tablet Take 1 tablet (10 mg  total) by mouth daily.  . blood glucose meter kit and supplies KIT Dispense based on patient and insurance preference. Use up to four times daily as directed. (FOR ICD-9 250.00, 250.01).  Marland Kitchen Exenatide ER (BYDUREON BCISE) 2 MG/0.85ML AUIJ Inject 2 mg into the skin once a week.  . ferrous sulfate 325 (65 FE) MG tablet Take 1 tablet (325 mg total) by mouth daily with breakfast.  . furosemide (LASIX) 40 MG tablet Take 1 tablet (40 mg total) by mouth daily.  Marland Kitchen glipiZIDE (GLUCOTROL) 10 MG tablet Take 1 tablet (10 mg total) by mouth 2 (two) times daily before a meal.  . Insulin Glargine (TOUJEO SOLOSTAR) 300 UNIT/ML SOPN Inject 50 Units into the skin at bedtime.  Marland Kitchen lisinopril (PRINIVIL,ZESTRIL) 10 MG tablet Take 1 tablet (10 mg total) by mouth daily.  . norethindrone (AYGESTIN) 5 MG tablet Take 3 tablets (15 mg total) by mouth daily.  . OXYGEN Inhale 2 L into the lungs daily.   No facility-administered encounter medications on file as of 08/08/2016.     Activities of Daily Living In your present state of health, do you have any difficulty performing the following activities: 08/08/2016  Hearing? N  Vision? N  Difficulty concentrating or making decisions? N  Walking  or climbing stairs? N  Dressing or bathing? N  Doing errands, shopping? N  Preparing Food and eating ? N  Using the Toilet? N  In the past six months, have you accidently leaked urine? N  Do you have problems with loss of bowel control? N  Managing your Medications? N  Managing your Finances? N  Housekeeping or managing your Housekeeping? N  Some recent data might be hidden    Patient Care Team: Kathrine Haddock, NP as PCP - General (Nurse Practitioner) Sharlotte Alamo, DPM (Podiatry) Defrancesco, Alanda Slim, MD as Consulting Physician (Obstetrics and Gynecology) Leandrew Koyanagi, MD as Referring Physician (Ophthalmology)    Assessment:     Exercise Activities and Dietary recommendations Current Exercise Habits: The patient does  not participate in regular exercise at present, Exercise limited by: None identified  Goals    . Increase water intake          Recommend drinking at least 4-5 glasses of water a day      Fall Risk Fall Risk  08/08/2016 08/02/2015  Falls in the past year? No No   Depression Screen PHQ 2/9 Scores 08/08/2016 08/02/2015 04/25/2015  PHQ - 2 Score 3 1 0  PHQ- 9 Score 7 - -     Cognitive Function     6CIT Screen 08/08/2016  What Year? 0 points  What month? 0 points  What time? 0 points  Count back from 20 0 points  Months in reverse 0 points  Repeat phrase 0 points  Total Score 0    Immunization History  Administered Date(s) Administered  . Pneumococcal Polysaccharide-23 01/12/2007  . Tdap 07/07/2007   Screening Tests Health Maintenance  Topic Date Due  . OPHTHALMOLOGY EXAM  06/06/2016  . FOOT EXAM  08/01/2016  . PNEUMOCOCCAL POLYSACCHARIDE VACCINE (2) 04/25/2034 (Originally 01/12/2012)  . INFLUENZA VACCINE  09/04/2016  . HEMOGLOBIN A1C  11/20/2016  . PAP SMEAR  03/09/2017  . TETANUS/TDAP  07/06/2017  . HIV Screening  Addressed      Plan:    I have personally reviewed and addressed the Medicare Annual Wellness questionnaire and have noted the following in the patient's chart:  A. Medical and social history B. Use of alcohol, tobacco or illicit drugs  C. Current medications and supplements D. Functional ability and status E.  Nutritional status F.  Physical activity G. Advance directives H. List of other physicians I.  Hospitalizations, surgeries, and ER visits in previous 12 months J.  [place] such as hearing and vision if needed, cognitive and depression L. Referrals and appointments   In addition, I have reviewed and discussed with patient certain preventive protocols, quality metrics, and best practice recommendations. A written personalized care plan for preventive services as well as general preventive health recommendations were provided to  patient.   Signed,  Tyler Aas, LPN Nurse Health Advisor   MD Recommendations: diabetic foot exam due

## 2016-08-21 ENCOUNTER — Ambulatory Visit (INDEPENDENT_AMBULATORY_CARE_PROVIDER_SITE_OTHER): Payer: Medicare Other | Admitting: Unknown Physician Specialty

## 2016-08-21 ENCOUNTER — Other Ambulatory Visit: Payer: Self-pay

## 2016-08-21 ENCOUNTER — Encounter: Payer: Self-pay | Admitting: Unknown Physician Specialty

## 2016-08-21 VITALS — BP 131/88 | HR 89 | Temp 98.9°F | Wt 391.6 lb

## 2016-08-21 DIAGNOSIS — E1122 Type 2 diabetes mellitus with diabetic chronic kidney disease: Secondary | ICD-10-CM | POA: Diagnosis not present

## 2016-08-21 DIAGNOSIS — I501 Left ventricular failure: Secondary | ICD-10-CM | POA: Diagnosis not present

## 2016-08-21 DIAGNOSIS — E668 Other obesity: Secondary | ICD-10-CM | POA: Diagnosis not present

## 2016-08-21 DIAGNOSIS — Z794 Long term (current) use of insulin: Secondary | ICD-10-CM | POA: Diagnosis not present

## 2016-08-21 DIAGNOSIS — D500 Iron deficiency anemia secondary to blood loss (chronic): Secondary | ICD-10-CM | POA: Diagnosis not present

## 2016-08-21 DIAGNOSIS — E669 Obesity, unspecified: Secondary | ICD-10-CM

## 2016-08-21 DIAGNOSIS — N182 Chronic kidney disease, stage 2 (mild): Secondary | ICD-10-CM | POA: Diagnosis not present

## 2016-08-21 DIAGNOSIS — R0902 Hypoxemia: Secondary | ICD-10-CM

## 2016-08-21 MED ORDER — INSULIN PEN NEEDLE 32G X 4 MM MISC: 1.0000 [IU] | Freq: Every morning | 3 refills | Status: AC

## 2016-08-21 NOTE — Assessment & Plan Note (Signed)
Reviewed notes from Dr. Okey Dupre.  As above.  Continue with daily Furosemide

## 2016-08-21 NOTE — Assessment & Plan Note (Signed)
Hgb A1C is 7.5 today.  Down for 8.5%  Continue improved compliance.  Recheck in 3 months

## 2016-08-21 NOTE — Assessment & Plan Note (Signed)
On O2 

## 2016-08-21 NOTE — Progress Notes (Signed)
BP 131/88 (BP Location: Left Arm, Cuff Size: Large)   Pulse 89   Temp 98.9 F (37.2 C)   Wt (!) 391 lb 9.6 oz (177.6 kg)   SpO2 (!) 87%   BMI 57.83 kg/m    Subjective:    Patient ID: Sara Zhang, female    DOB: April 02, 1970, 46 y.o.   MRN: [id number]  HPI: Sara Zhang is a 46 y.o. female  Chief Complaint  Patient presents with  . Diabetes    pt states last eye exam in chart is correct   . Hyperlipidemia  . Hypertension   Since last visit, chart reviewed.  Had an endometrial ablation and visit with cardiology who increased Lasix to BID with permission to cut back to daily when she felt better.    Diabetes: Using medications without difficulties.  Takes 50u of Toujeo that she some No hypoglycemic episodes No hyperglycemic episodes Feet problems: Blood Sugars averaging: Low 100's mostly eye exam within last year Last Hgb A1C: 9.5  Hypertension  Using medications without difficulty Average home BPs   Using medication without problems or lightheadedness No chest pain with exertion or shortness of breath No Edema  Elevated Cholesterol Using medications without problems No Muscle aches  Unable to exercise   Relevant past medical, surgical, family and social history reviewed and updated as indicated. Interim medical history since our last visit reviewed. Allergies and medications reviewed and updated.  Review of Systems  Per HPI unless specifically indicated above     Objective:    BP 131/88 (BP Location: Left Arm, Cuff Size: Large)   Pulse 89   Temp 98.9 F (37.2 C)   Wt (!) 391 lb 9.6 oz (177.6 kg)   SpO2 (!) 87%   BMI 57.83 kg/m   Wt Readings from Last 3 Encounters:  08/21/16 (!) 391 lb 9.6 oz (177.6 kg)  08/08/16 (!) 387 lb 6.4 oz (175.7 kg)  07/24/16 (!) 385 lb 5 oz (174.8 kg)    Physical Exam  Constitutional: She is oriented to person, place, and time. She appears well-developed and well-nourished. No distress.  HENT:  Head: Normocephalic  and atraumatic.  Eyes: Conjunctivae and lids are normal. Right eye exhibits no discharge. Left eye exhibits no discharge. No scleral icterus.  Neck: Normal range of motion. Neck supple. No JVD present. Carotid bruit is not present.  Cardiovascular: Normal rate, regular rhythm and normal heart sounds.   Pulmonary/Chest: Effort normal and breath sounds normal.  On O2  Abdominal: Normal appearance. There is no splenomegaly or hepatomegaly.  Musculoskeletal: Normal range of motion.  Neurological: She is alert and oriented to person, place, and time.  Skin: Skin is warm, dry and intact. No rash noted. No pallor.  Psychiatric: She has a normal mood and affect. Her behavior is normal. Judgment and thought content normal.    Results for orders placed or performed during the hospital encounter of 08/08/16  Basic metabolic panel  Result Value Ref Range   Sodium 140 135 - 145 mmol/L   Potassium 3.8 3.5 - 5.1 mmol/L   Chloride 106 101 - 111 mmol/L   CO2 25 22 - 32 mmol/L   Glucose, Bld 172 (H) 65 - 99 mg/dL   BUN 17 6 - 20 mg/dL   Creatinine, Ser 4.09 (H) 0.44 - 1.00 mg/dL   Calcium 8.2 (L) 8.9 - 10.3 mg/dL   GFR calc non Af Amer >60 >60 mL/min   GFR calc Af Amer >60 >60 mL/min  Anion gap 9 5 - 15      Assessment & Plan:   Problem List Items Addressed This Visit      Unprioritized   CKD (chronic kidney disease), stage II - Primary    Check GFR      Extreme obesity   Hypoxia    On O2      Iron deficiency anemia    Should improve with gyn care.  Check CBC      Relevant Orders   CBC with Differential/Platelet   Left heart failure [facility])    Reviewed notes from Dr. Okey Dupre.  As above.  Continue with daily Furosemide      Type 2 diabetes mellitus with chronic kidney disease (HCC)    Hgb A1C is 7.5 today.  Down for 8.5%  Continue improved compliance.  Recheck in 3 months      Relevant Orders   Comprehensive metabolic panel   Bayer DCA Hb H4K Waived       Follow up  plan: Return in about 3 months (around 11/21/2016).

## 2016-08-21 NOTE — Assessment & Plan Note (Signed)
Check GFR 

## 2016-08-21 NOTE — Assessment & Plan Note (Signed)
Should improve with gyn care.  Check CBC

## 2016-08-22 LAB — CBC WITH DIFFERENTIAL/PLATELET
BASOS: 0 %
Basophils Absolute: 0 10*3/uL (ref 0.0–0.2)
EOS (ABSOLUTE): 0.5 10*3/uL — AB (ref 0.0–0.4)
EOS: 6 %
Hematocrit: 31.3 % — ABNORMAL LOW (ref 34.0–46.6)
Hemoglobin: 8.8 g/dL — ABNORMAL LOW (ref 11.1–15.9)
IMMATURE GRANS (ABS): 0 10*3/uL (ref 0.0–0.1)
IMMATURE GRANULOCYTES: 0 %
LYMPHS: 25 %
Lymphocytes Absolute: 2.2 10*3/uL (ref 0.7–3.1)
MCH: 20.3 pg — AB (ref 26.6–33.0)
MCHC: 28.1 g/dL — ABNORMAL LOW (ref 31.5–35.7)
MCV: 72 fL — AB (ref 79–97)
Monocytes Absolute: 0.6 10*3/uL (ref 0.1–0.9)
Monocytes: 6 %
NEUTROS ABS: 5.4 10*3/uL (ref 1.4–7.0)
NEUTROS PCT: 63 %
PLATELETS: 543 10*3/uL — AB (ref 150–379)
RBC: 4.33 x10E6/uL (ref 3.77–5.28)
RDW: 18.6 % — ABNORMAL HIGH (ref 12.3–15.4)
WBC: 8.8 10*3/uL (ref 3.4–10.8)

## 2016-08-22 LAB — COMPREHENSIVE METABOLIC PANEL
A/G RATIO: 1.2 (ref 1.2–2.2)
ALT: 11 IU/L (ref 0–32)
AST: 11 IU/L (ref 0–40)
Albumin: 3.5 g/dL (ref 3.5–5.5)
Alkaline Phosphatase: 64 IU/L (ref 39–117)
BUN/Creatinine Ratio: 16 (ref 9–23)
BUN: 16 mg/dL (ref 6–24)
Bilirubin Total: 0.3 mg/dL (ref 0.0–1.2)
CO2: 26 mmol/L (ref 20–29)
Calcium: 8 mg/dL — ABNORMAL LOW (ref 8.7–10.2)
Chloride: 101 mmol/L (ref 96–106)
Creatinine, Ser: 1 mg/dL (ref 0.57–1.00)
GFR, EST AFRICAN AMERICAN: 78 mL/min/{1.73_m2} (ref >59)
GFR, EST NON AFRICAN AMERICAN: 67 mL/min/{1.73_m2} (ref >59)
Globulin, Total: 3 g/dL (ref 1.5–4.5)
Glucose: 112 mg/dL — ABNORMAL HIGH (ref 65–99)
Potassium: 5.1 mmol/L (ref 3.5–5.2)
Sodium: 140 mmol/L (ref 134–144)
TOTAL PROTEIN: 6.5 g/dL (ref 6.0–8.5)

## 2016-08-23 ENCOUNTER — Encounter: Payer: Self-pay | Admitting: Unknown Physician Specialty

## 2016-08-29 LAB — BAYER DCA HB A1C WAIVED: HB A1C (BAYER DCA - WAIVED): 7.5 % — ABNORMAL HIGH (ref <7.0)

## 2016-10-11 ENCOUNTER — Encounter: Payer: Self-pay | Admitting: Unknown Physician Specialty

## 2016-10-14 ENCOUNTER — Telehealth: Payer: Self-pay | Admitting: Obstetrics and Gynecology

## 2016-10-14 DIAGNOSIS — N939 Abnormal uterine and vaginal bleeding, unspecified: Secondary | ICD-10-CM

## 2016-10-14 MED ORDER — NORETHINDRONE ACETATE 5 MG PO TABS: 15.0000 mg | ORAL_TABLET | Freq: Every day | ORAL | 2 refills | Status: DC

## 2016-10-14 NOTE — Telephone Encounter (Signed)
Pt aware referral has been placed to [facility]. Apologized  for the delay. Gave refill of norethindrone.

## 2016-10-14 NOTE — Telephone Encounter (Signed)
I messed up and did not put this referral in. So sorry. Can you pls clarify who you want to refer her to. ty cm

## 2016-10-14 NOTE — Telephone Encounter (Signed)
Patient called stating she was supposed to get a referral to [facility] a couple months ago. I do not see a referral was ever placed.

## 2016-10-16 NOTE — Telephone Encounter (Signed)
Pt aware referral is complete on our end. [facility] will contact pt for appt.

## 2016-10-30 ENCOUNTER — Encounter: Payer: Self-pay | Admitting: Internal Medicine

## 2016-10-30 ENCOUNTER — Ambulatory Visit (INDEPENDENT_AMBULATORY_CARE_PROVIDER_SITE_OTHER): Payer: Medicare Other | Admitting: Internal Medicine

## 2016-10-30 VITALS — BP 154/90 | HR 83 | Ht 69.0 in | Wt 382.8 lb

## 2016-10-30 DIAGNOSIS — D500 Iron deficiency anemia secondary to blood loss (chronic): Secondary | ICD-10-CM | POA: Diagnosis not present

## 2016-10-30 DIAGNOSIS — I5032 Chronic diastolic (congestive) heart failure: Secondary | ICD-10-CM | POA: Insufficient documentation

## 2016-10-30 DIAGNOSIS — I10 Essential (primary) hypertension: Secondary | ICD-10-CM | POA: Insufficient documentation

## 2016-10-30 DIAGNOSIS — D649 Anemia, unspecified: Secondary | ICD-10-CM | POA: Diagnosis not present

## 2016-10-30 MED ORDER — LISINOPRIL 20 MG PO TABS: 20.0000 mg | ORAL_TABLET | Freq: Every day | ORAL | 3 refills | Status: DC

## 2016-10-30 NOTE — Patient Instructions (Signed)
Medication Instructions:  Your physician has recommended you make the following change in your medication:  1- INCREASE Lisinopril to 20 mg by mouth once a day.   Labwork: Your physician recommends that you return for lab work in: TODAY (BMP, CBC no diff).   Testing/Procedures: none  Follow-Up: Your physician recommends that you schedule a follow-up appointment in: 3 MONTHS WITH DR END.   If you need a refill on your cardiac medications before your next appointment, please call your pharmacy.

## 2016-10-30 NOTE — Progress Notes (Signed)
Follow-up Outpatient Visit Date: 10/30/2016  Primary Care Provider: Kathrine Haddock, NP [address] [address]  Chief Complaint: Fatigue and vaginal bleeding  HPI:  Ms. Riepe is a 46 y.o. year-old female with history of systolic heart failure (LVEF reportedly 25-35% in 2014 but normalized on echo in 06/2016), hypertension, hyperlipidemia, and type 2 diabetes mellitus, who presents for follow-up of heart failure. I last saw Ms. Patlan in June, which time she was feeling better than at our original visit. She had not had any further episodes of chest pain and reported that her shortness of breath and oxygen levels had improved. She had also decreased furosemide to 40 mg daily, as her leg edema had resolved. We did not make any medication changes at that time.  Today, Ms. Worst reports that she has continued to have heavy vaginal bleeding. She is scheduled for endometrial ablation at [facility] on 10/22. She has noted worsening fatigue and exertional dyspnea since our last visit. She has not had any significant orthopnea or PND; she continues to use CPAP nightly. She also denies chest pain and palpitations. Leg edema has been well-controlled with once-daily furosemide. Ms. Stampley notes that her blood pressure has been up for the last several weeks, similar to today's reading in the office. She has been compliant with her medications, including furosemide, atenolol, and iron.  --------------------------------------------------------------------------------------------------  Cardiovascular History & Procedures: Cardiovascular Problems:  Chronic systolic heart failure with normalization of LVEF  Risk Factors:  Hypertension, hyperlipidemia, diabetes mellitus, and obesity  Cath/PCI:  None  CV Surgery:  None  EP Procedures and Devices:  None  Non-Invasive Evaluation(s):  TTE (06/10/16): Normal LV size and function with LVEF of 55-60%. Normal diastolic parameters.  Mild MR. Normal RV size and function. Normal pulmonary artery pressure.  Report of LVEF 25-35% with global hypokinesis by outside echo on her before 03/04/12  Recent CV Pertinent Labs: Lab Results  Component Value Date   CHOL 132 05/21/2016   CHOL 137 08/02/2015   HDL 37 (L) 05/21/2016   LDLCALC 77 05/21/2016   TRIG 89 05/21/2016   TRIG 80 08/02/2015   BNP 51.0 12/16/2015   K 5.1 08/21/2016   K 4.2 09/25/2013   BUN 16 08/21/2016   BUN 12 09/25/2013   CREATININE 1.00 08/21/2016   CREATININE 0.75 09/25/2013    Past medical and surgical history were reviewed and updated in EPIC.  Current Meds  Medication Sig  . atenolol (TENORMIN) 25 MG tablet Take 1 tablet (25 mg total) by mouth daily.  Marland Kitchen atorvastatin (LIPITOR) 10 MG tablet Take 1 tablet (10 mg total) by mouth daily.  . blood glucose meter kit and supplies KIT Dispense based on patient and insurance preference. Use up to four times daily as directed. (FOR ICD-9 250.00, 250.01).  . CONTOUR NEXT TEST test strip TEST BLOOD SUGAR UP TO  once daily  . Exenatide ER (BYDUREON BCISE) 2 MG/0.85ML AUIJ Inject 2 mg into the skin once a week.  . ferrous sulfate 325 (65 FE) MG tablet Take 1 tablet (325 mg total) by mouth daily with breakfast.  . furosemide (LASIX) 40 MG tablet Take 1 tablet (40 mg total) by mouth daily.  Marland Kitchen glipiZIDE (GLUCOTROL) 10 MG tablet Take 1 tablet (10 mg total) by mouth 2 (two) times daily before a meal.  . Insulin Glargine (TOUJEO SOLOSTAR) 300 UNIT/ML SOPN Inject 50 Units into the skin at bedtime.  . Insulin Pen Needle 32G X 4 MM MISC 1 Units by  Does not apply route every morning. Pen needles  . lisinopril (PRINIVIL,ZESTRIL) 10 MG tablet Take 1 tablet (10 mg total) by mouth daily.  . norethindrone (AYGESTIN) 5 MG tablet Take 3 tablets (15 mg total) by mouth daily.  . OXYGEN Inhale 2 L into the lungs daily.    Allergies: Patient has no known allergies.  Social History   Social History  . Marital status: Single      Spouse name: N/A  . Number of children: N/A  . Years of education: N/A   Occupational History  . Not on file.   Social History Main Topics  . Smoking status: Never Smoker  . Smokeless tobacco: Former Systems developer  . Alcohol use 0.0 oz/week     Comment: 1-2 beers/month  . Drug use: No  . Sexual activity: Yes    Birth control/ protection: None   Other Topics Concern  . Not on file   Social History Narrative  . No narrative on file    Family History  Problem Relation Age of Onset  . Diabetes Mother   . Hyperlipidemia Mother   . Hypertension Mother   . Stroke Mother   . Arthritis Father   . Asthma Father   . Diabetes Father   . Hypertension Father   . Hypertension Brother   . Diabetes Brother   . Diabetes Maternal Grandmother   . Hypertension Maternal Grandmother   . Diabetes Maternal Grandfather   . Hypertension Maternal Grandfather   . Diabetes Paternal Grandmother   . Hypertension Paternal Grandmother   . Heart attack Maternal Uncle     Review of Systems: A 12-system review of systems was performed and was negative except as noted in the HPI.  --------------------------------------------------------------------------------------------------  Physical Exam: BP (!) 154/90 (BP Location: Left Arm, Patient Position: Sitting, Cuff Size: Large)   Pulse 83   Ht _0  (1.753 m)   Wt (!) 382 lb 12 oz (173.6 kg)   SpO2 93%   BMI 56.52 kg/m   General:  Morbidly obese woman, seated on exam table. HEENT: Conjunctival pallor noted without scleral icterus. Moist mucous membranes.  OP clear. Neck: Supple without lymphadenopathy or thyromegaly. Unable to assess JVD/HJR due to body habitus. Lungs: Normal work of breathing. Clear to auscultation bilaterally without wheezes or crackles. Heart: Distant heart sounds. Regular rate and rhythm without murmurs, rubs, or gallops. Non-displaced PMI. Abd: Bowel sounds present. Obese, soft, and non-tender. Unable to assess HSM due to body  habitus. Ext: Trace pretibial edema bilaterally. Radial, PT, and DP pulses are 2+ bilaterally. Skin: Warm and dry without rash.  Lab Results  Component Value Date   WBC 8.8 08/21/2016   HGB 8.8 (L) 08/21/2016   HCT 31.3 (L) 08/21/2016   MCV 72 (L) 08/21/2016   PLT 543 (H) 08/21/2016    Lab Results  Component Value Date   NA 140 08/21/2016   K 5.1 08/21/2016   CL 101 08/21/2016   CO2 26 08/21/2016   BUN 16 08/21/2016   CREATININE 1.00 08/21/2016   GLUCOSE 112 (H) 08/21/2016   ALT 11 08/21/2016    Lab Results  Component Value Date   CHOL 132 05/21/2016   HDL 37 (L) 05/21/2016   LDLCALC 77 05/21/2016   TRIG 89 05/21/2016    --------------------------------------------------------------------------------------------------  ASSESSMENT AND PLAN: Chronic diastolic heart failure No significant edema noted on exam. Weight is also down 3 pounds since our last visit in June. We will continue current dose of furosemide. I will  increase lisinopril to 20 mg daily due to poorly controlled blood pressure. We will also check a BMP today.  Hypertension Blood pressure is poorly controlled today, similar to several prior visits. We will increase lisinopril to 20 mg daily and check a BMP today and again in 2 weeks.  Chronic iron-deficiency anemia I am concerned that Ms. Mcmillion's worsening shortness of breath and fatigue could be due to worsening anemia in the setting of continued vaginal bleeding. We will check a CBC today. If her hemoglobin has fallen further (8.8 in July), we will need to refer her to the ED for blood transfusion in the setting of symptomatic anemia. Ms. Forman should continue her iron therapy and planned endometrial ablation at [facility] next month.  Follow-up: Return to clinic in 3 months.  Nelva Bush, MD 10/30/2016 8:18 AM

## 2016-10-31 ENCOUNTER — Other Ambulatory Visit: Payer: Self-pay

## 2016-10-31 DIAGNOSIS — I10 Essential (primary) hypertension: Secondary | ICD-10-CM

## 2016-10-31 DIAGNOSIS — I5032 Chronic diastolic (congestive) heart failure: Secondary | ICD-10-CM

## 2016-10-31 LAB — CBC
Hematocrit: 35.9 % (ref 34.0–46.6)
Hemoglobin: 10.1 g/dL — ABNORMAL LOW (ref 11.1–15.9)
MCH: 21.8 pg — ABNORMAL LOW (ref 26.6–33.0)
MCHC: 28.1 g/dL — ABNORMAL LOW (ref 31.5–35.7)
MCV: 77 fL — ABNORMAL LOW (ref 79–97)
PLATELETS: 539 10*3/uL — AB (ref 150–379)
RBC: 4.64 x10E6/uL (ref 3.77–5.28)
RDW: 16.8 % — ABNORMAL HIGH (ref 12.3–15.4)
WBC: 9.2 10*3/uL (ref 3.4–10.8)

## 2016-10-31 LAB — BASIC METABOLIC PANEL
BUN/Creatinine Ratio: 17 (ref 9–23)
BUN: 18 mg/dL (ref 6–24)
CO2: 24 mmol/L (ref 20–29)
Calcium: 8.2 mg/dL — ABNORMAL LOW (ref 8.7–10.2)
Chloride: 102 mmol/L (ref 96–106)
Creatinine, Ser: 1.09 mg/dL — ABNORMAL HIGH (ref 0.57–1.00)
GFR calc Af Amer: 70 mL/min/{1.73_m2} (ref >59)
GFR calc non Af Amer: 61 mL/min/{1.73_m2} (ref >59)
GLUCOSE: 136 mg/dL — AB (ref 65–99)
POTASSIUM: 4.5 mmol/L (ref 3.5–5.2)
SODIUM: 143 mmol/L (ref 134–144)

## 2016-11-15 ENCOUNTER — Other Ambulatory Visit
Admission: RE | Admit: 2016-11-15 | Discharge: 2016-11-15 | Disposition: A | Discharge status: Home / Self Care | Payer: Medicare Other | Source: Ambulatory Visit | Attending: Internal Medicine | Admitting: Internal Medicine

## 2016-11-15 DIAGNOSIS — I10 Essential (primary) hypertension: Secondary | ICD-10-CM | POA: Insufficient documentation

## 2016-11-15 DIAGNOSIS — I5032 Chronic diastolic (congestive) heart failure: Secondary | ICD-10-CM | POA: Insufficient documentation

## 2016-11-15 LAB — BASIC METABOLIC PANEL
Anion gap: 7 (ref 5–15)
BUN: 16 mg/dL (ref 6–20)
CHLORIDE: 105 mmol/L (ref 101–111)
CO2: 28 mmol/L (ref 22–32)
Calcium: 8.1 mg/dL — ABNORMAL LOW (ref 8.9–10.3)
Creatinine, Ser: 1.25 mg/dL — ABNORMAL HIGH (ref 0.44–1.00)
GFR calc non Af Amer: 50 mL/min — ABNORMAL LOW (ref >60)
GFR, EST AFRICAN AMERICAN: 58 mL/min — AB (ref >60)
Glucose, Bld: 166 mg/dL — ABNORMAL HIGH (ref 65–99)
POTASSIUM: 4.1 mmol/L (ref 3.5–5.1)
SODIUM: 140 mmol/L (ref 135–145)

## 2016-11-22 ENCOUNTER — Ambulatory Visit: Payer: Medicare Other | Admitting: Unknown Physician Specialty

## 2016-11-25 ENCOUNTER — Ambulatory Visit: Payer: Medicare Other | Admitting: Unknown Physician Specialty

## 2016-11-25 DIAGNOSIS — N939 Abnormal uterine and vaginal bleeding, unspecified: Secondary | ICD-10-CM

## 2016-11-25 HISTORY — DX: Abnormal uterine and vaginal bleeding, unspecified: N93.9

## 2016-12-02 ENCOUNTER — Other Ambulatory Visit: Payer: Self-pay

## 2016-12-02 NOTE — Telephone Encounter (Signed)
Patient has f/up scheduled for 12/09/16.

## 2016-12-03 ENCOUNTER — Ambulatory Visit: Payer: Medicare Other | Admitting: Internal Medicine

## 2016-12-03 ENCOUNTER — Telehealth: Payer: Self-pay | Admitting: *Deleted

## 2016-12-03 MED ORDER — ATORVASTATIN CALCIUM 10 MG PO TABS: 10.0000 mg | ORAL_TABLET | Freq: Every day | ORAL | 1 refills | Status: DC

## 2016-12-03 MED ORDER — ATENOLOL 25 MG PO TABS: 25.0000 mg | ORAL_TABLET | Freq: Every day | ORAL | 1 refills | Status: DC

## 2016-12-03 NOTE — Telephone Encounter (Signed)
This telephone encounter faxed to Dr Tamsen Snider office at [phone] via [facility] fax.

## 2016-12-03 NOTE — Telephone Encounter (Signed)
Thank you for the update.  Given no unstable cardiac symptoms since our visit last month, I feel that it is reasonable for [name] to proceed with her planned hysteroscopically at [facility] in December without additional testing or intervention.  If she develops any new symptoms, including chest pain, shortness of breath, or syncope in the meantime, she should contact us for further evaluation.  Yvonne Kendall, MD [facility] Pager: [phone]

## 2016-12-03 NOTE — Telephone Encounter (Signed)
Patient last seen 10/30/16. Patient has appointment this afternoon. Dr End asked me to call patient to assess any current symptoms. Patient denies any recent or ongoing chest pain, shortness of breath, dizziness, or palpitations. She reports no new symptoms since last office visit. Patient's hysteroscopy with Dr Erick Blinks is 01/22/17 at [facility]. I asked patient to call her physician to have them send [place] a clearance. Patient's appointment for today rescheduled for January 2019 for her 3 month f/u. Routing to Dr End to make him aware.

## 2016-12-09 ENCOUNTER — Encounter: Payer: Self-pay | Admitting: Unknown Physician Specialty

## 2016-12-09 ENCOUNTER — Ambulatory Visit (INDEPENDENT_AMBULATORY_CARE_PROVIDER_SITE_OTHER): Payer: Medicare Other | Admitting: Unknown Physician Specialty

## 2016-12-09 VITALS — BP 129/84 | HR 80 | Temp 99.0°F | Wt 378.0 lb

## 2016-12-09 DIAGNOSIS — E782 Mixed hyperlipidemia: Secondary | ICD-10-CM | POA: Diagnosis not present

## 2016-12-09 DIAGNOSIS — D500 Iron deficiency anemia secondary to blood loss (chronic): Secondary | ICD-10-CM

## 2016-12-09 DIAGNOSIS — Z794 Long term (current) use of insulin: Secondary | ICD-10-CM

## 2016-12-09 DIAGNOSIS — I10 Essential (primary) hypertension: Secondary | ICD-10-CM

## 2016-12-09 DIAGNOSIS — E1122 Type 2 diabetes mellitus with diabetic chronic kidney disease: Secondary | ICD-10-CM

## 2016-12-09 DIAGNOSIS — N182 Chronic kidney disease, stage 2 (mild): Secondary | ICD-10-CM

## 2016-12-09 DIAGNOSIS — R7989 Other specified abnormal findings of blood chemistry: Secondary | ICD-10-CM | POA: Insufficient documentation

## 2016-12-09 NOTE — Assessment & Plan Note (Signed)
Elevated last blood draw 2 weeks ago.  Check next visit

## 2016-12-09 NOTE — Assessment & Plan Note (Addendum)
Hgb A1C was 9.1%.  Takin insulin nightly but states "I messed up."  She would like to work on her diet as has been eating very badly.  Gave written instructions on titrating insulin

## 2016-12-09 NOTE — Progress Notes (Signed)
BP 129/84 (BP Location: Left Arm, Cuff Size: Large)   Pulse 80   Temp 99 F (37.2 C)   Wt (!) 378 lb (171.5 kg)   SpO2 96%   BMI 55.82 kg/m    Subjective:    Patient ID: Sara Zhang, female    DOB: 12/11/1970, 46 y.o.   MRN: [id number]  HPI: Sara Zhang is a 46 y.o. female  Chief Complaint  Patient presents with  . Diabetes    pt states eye exam in chart is correct   . Hyperlipidemia  . Hypertension   Diabetes: Using medications without difficulties.  Taking 50 u of Toujeo but seems to be adjusting her dose up and down depending on evening blood sugar.  Taking Bydureon weekly No hypoglycemic episodes No hyperglycemic episodes Feet problems none Blood Sugars averaging: eye exam within last year Last Hgb A1C: 7.5  Hypertension  Using medications without difficulty Average home BPs   Using medication without problems or lightheadedness No chest pain with exertion or shortness of breath No Edema  Elevated Cholesterol Using medications without problems No Muscle aches  Diet:  Exercise:  CHF Followed by Dr. Okey DupreEnd   Relevant past medical, surgical, family and social history reviewed and updated as indicated. Interim medical history since our last visit reviewed. Allergies and medications reviewed and updated.  Review of Systems  Per HPI unless specifically indicated above     Objective:    BP 129/84 (BP Location: Left Arm, Cuff Size: Large)   Pulse 80   Temp 99 F (37.2 C)   Wt (!) 378 lb (171.5 kg)   SpO2 96%   BMI 55.82 kg/m   Wt Readings from Last 3 Encounters:  12/09/16 (!) 378 lb (171.5 kg)  10/30/16 (!) 382 lb 12 oz (173.6 kg)  08/21/16 (!) 391 lb 9.6 oz (177.6 kg)    Physical Exam  Constitutional: She is oriented to person, place, and time. She appears well-developed and well-nourished. No distress.  HENT:  Head: Normocephalic and atraumatic.  Eyes: Conjunctivae and lids are normal. Right eye exhibits no discharge. Left eye exhibits  no discharge. No scleral icterus.  Neck: Normal range of motion. Neck supple. No JVD present. Carotid bruit is not present.  Cardiovascular: Normal rate, regular rhythm and normal heart sounds.  Pulmonary/Chest: Effort normal and breath sounds normal.  Abdominal: Normal appearance. There is no splenomegaly or hepatomegaly.  Musculoskeletal: Normal range of motion.  Neurological: She is alert and oriented to person, place, and time.  Skin: Skin is warm, dry and intact. No rash noted. No pallor.  Psychiatric: She has a normal mood and affect. Her behavior is normal. Judgment and thought content normal.    Results for orders placed or performed during the hospital encounter of 11/15/16  Basic Metabolic Panel (BMET)  Result Value Ref Range   Sodium 140 135 - 145 mmol/L   Potassium 4.1 3.5 - 5.1 mmol/L   Chloride 105 101 - 111 mmol/L   CO2 28 22 - 32 mmol/L   Glucose, Bld 166 (H) 65 - 99 mg/dL   BUN 16 6 - 20 mg/dL   Creatinine, Ser 4.091.25 (H) 0.44 - 1.00 mg/dL   Calcium 8.1 (L) 8.9 - 10.3 mg/dL   GFR calc non Af Amer 50 (L) >60 mL/min   GFR calc Af Amer 58 (L) >60 mL/min   Anion gap 7 5 - 15      Assessment & Plan:   Problem List Items Addressed  This Visit      Unprioritized   Elevated TSH    Elevated last blood draw 2 weeks ago.  Check next visit      Essential hypertension    Stable, continue present medications.        Relevant Medications   furosemide (LASIX) 40 MG tablet   Hyperlipidemia   Relevant Medications   furosemide (LASIX) 40 MG tablet   Other Relevant Orders   Comprehensive metabolic panel   Lipid Panel w/o Chol/HDL Ratio   Iron deficiency anemia due to chronic blood loss - Primary   Relevant Orders   CBC with Differential/Platelet   Type 2 diabetes mellitus with chronic kidney disease (HCC)    Hgb A1C was 9.1%.  Takin insulin nightly but states "I messed up."  She would like to work on her diet as has been eating very badly.  Gave written instructions on  titrating insulin      Relevant Orders   Comprehensive metabolic panel   Bayer DCA Hb I7O Waived       Follow up plan: Return in about 3 months (around 03/11/2017).

## 2016-12-09 NOTE — Patient Instructions (Signed)
Base your long acting insulin on your fasting (usually in the morning) blood sugar.  Increase long acting (daily insulin) 2 units if fasting blood sugar is greater than 120.  Decrease by 2 units if fasting blood sugar is less than 95.    

## 2016-12-09 NOTE — Assessment & Plan Note (Signed)
Stable, continue present medications.   

## 2016-12-10 LAB — COMPREHENSIVE METABOLIC PANEL
ALK PHOS: 64 IU/L (ref 39–117)
ALT: 8 IU/L (ref 0–32)
AST: 10 IU/L (ref 0–40)
Albumin/Globulin Ratio: 1.1 — ABNORMAL LOW (ref 1.2–2.2)
Albumin: 3.5 g/dL (ref 3.5–5.5)
BUN/Creatinine Ratio: 12 (ref 9–23)
BUN: 15 mg/dL (ref 6–24)
Bilirubin Total: <0.2 mg/dL (ref 0.0–1.2)
CHLORIDE: 102 mmol/L (ref 96–106)
CO2: 26 mmol/L (ref 20–29)
CREATININE: 1.27 mg/dL — AB (ref 0.57–1.00)
Calcium: 8 mg/dL — ABNORMAL LOW (ref 8.7–10.2)
GFR calc Af Amer: 58 mL/min/{1.73_m2} — ABNORMAL LOW (ref >59)
GFR calc non Af Amer: 50 mL/min/{1.73_m2} — ABNORMAL LOW (ref >59)
GLOBULIN, TOTAL: 3.3 g/dL (ref 1.5–4.5)
GLUCOSE: 164 mg/dL — AB (ref 65–99)
Potassium: 4.7 mmol/L (ref 3.5–5.2)
Sodium: 144 mmol/L (ref 134–144)
Total Protein: 6.8 g/dL (ref 6.0–8.5)

## 2016-12-10 LAB — CBC WITH DIFFERENTIAL/PLATELET
BASOS ABS: 0 10*3/uL (ref 0.0–0.2)
Basos: 0 %
EOS (ABSOLUTE): 0.3 10*3/uL (ref 0.0–0.4)
Eos: 4 %
HEMOGLOBIN: 9.8 g/dL — AB (ref 11.1–15.9)
Hematocrit: 33 % — ABNORMAL LOW (ref 34.0–46.6)
IMMATURE GRANS (ABS): 0 10*3/uL (ref 0.0–0.1)
IMMATURE GRANULOCYTES: 0 %
LYMPHS: 25 %
Lymphocytes Absolute: 1.8 10*3/uL (ref 0.7–3.1)
MCH: 22.5 pg — ABNORMAL LOW (ref 26.6–33.0)
MCHC: 29.7 g/dL — AB (ref 31.5–35.7)
MCV: 76 fL — ABNORMAL LOW (ref 79–97)
MONOCYTES: 9 %
Monocytes Absolute: 0.6 10*3/uL (ref 0.1–0.9)
NEUTROS PCT: 62 %
Neutrophils Absolute: 4.4 10*3/uL (ref 1.4–7.0)
Platelets: 453 10*3/uL — ABNORMAL HIGH (ref 150–379)
RBC: 4.35 x10E6/uL (ref 3.77–5.28)
RDW: 15.9 % — ABNORMAL HIGH (ref 12.3–15.4)
WBC: 7.2 10*3/uL (ref 3.4–10.8)

## 2016-12-10 LAB — LIPID PANEL W/O CHOL/HDL RATIO
CHOLESTEROL TOTAL: 118 mg/dL (ref 100–199)
HDL: 25 mg/dL — AB (ref >39)
LDL CALC: 80 mg/dL (ref 0–99)
TRIGLYCERIDES: 66 mg/dL (ref 0–149)
VLDL CHOLESTEROL CAL: 13 mg/dL (ref 5–40)

## 2016-12-19 NOTE — Telephone Encounter (Signed)
Faxed cardiac clearance request to [facility], Attn: Dr. Erick Blinks @ [phone]. Per Eula Listen, PA, pt's risk for major abdominal/pelvic surgery is appropriate. EKG and ECHO faxed, as well.

## 2017-01-15 ENCOUNTER — Ambulatory Visit: Payer: Medicare Other | Admitting: Internal Medicine

## 2017-01-20 DIAGNOSIS — E669 Obesity, unspecified: Secondary | ICD-10-CM | POA: Diagnosis not present

## 2017-01-20 DIAGNOSIS — E118 Type 2 diabetes mellitus with unspecified complications: Secondary | ICD-10-CM | POA: Diagnosis not present

## 2017-01-20 DIAGNOSIS — I10 Essential (primary) hypertension: Secondary | ICD-10-CM | POA: Diagnosis not present

## 2017-01-20 DIAGNOSIS — Z01818 Encounter for other preprocedural examination: Secondary | ICD-10-CM | POA: Diagnosis not present

## 2017-01-20 DIAGNOSIS — G4733 Obstructive sleep apnea (adult) (pediatric): Secondary | ICD-10-CM | POA: Diagnosis not present

## 2017-01-20 DIAGNOSIS — I509 Heart failure, unspecified: Secondary | ICD-10-CM | POA: Diagnosis not present

## 2017-01-22 DIAGNOSIS — I517 Cardiomegaly: Secondary | ICD-10-CM | POA: Diagnosis not present

## 2017-01-22 DIAGNOSIS — E785 Hyperlipidemia, unspecified: Secondary | ICD-10-CM | POA: Diagnosis not present

## 2017-01-22 DIAGNOSIS — N939 Abnormal uterine and vaginal bleeding, unspecified: Secondary | ICD-10-CM | POA: Diagnosis not present

## 2017-01-22 DIAGNOSIS — N710 Acute inflammatory disease of uterus: Secondary | ICD-10-CM | POA: Diagnosis not present

## 2017-01-22 DIAGNOSIS — Z01818 Encounter for other preprocedural examination: Secondary | ICD-10-CM | POA: Diagnosis not present

## 2017-01-22 DIAGNOSIS — I110 Hypertensive heart disease with heart failure: Secondary | ICD-10-CM | POA: Diagnosis not present

## 2017-01-22 DIAGNOSIS — I5022 Chronic systolic (congestive) heart failure: Secondary | ICD-10-CM | POA: Diagnosis not present

## 2017-01-22 DIAGNOSIS — E118 Type 2 diabetes mellitus with unspecified complications: Secondary | ICD-10-CM | POA: Diagnosis not present

## 2017-01-22 DIAGNOSIS — Z79899 Other long term (current) drug therapy: Secondary | ICD-10-CM | POA: Diagnosis not present

## 2017-01-22 DIAGNOSIS — Z6841 Body Mass Index (BMI) 40.0 and over, adult: Secondary | ICD-10-CM | POA: Diagnosis not present

## 2017-01-22 DIAGNOSIS — E669 Obesity, unspecified: Secondary | ICD-10-CM | POA: Diagnosis not present

## 2017-01-22 DIAGNOSIS — G4733 Obstructive sleep apnea (adult) (pediatric): Secondary | ICD-10-CM | POA: Diagnosis not present

## 2017-01-22 DIAGNOSIS — N856 Intrauterine synechiae: Secondary | ICD-10-CM | POA: Diagnosis not present

## 2017-01-22 DIAGNOSIS — Z794 Long term (current) use of insulin: Secondary | ICD-10-CM | POA: Diagnosis not present

## 2017-01-27 LAB — BAYER DCA HB A1C WAIVED: HB A1C: 9.1 % — AB (ref <7.0)

## 2017-02-12 ENCOUNTER — Ambulatory Visit: Payer: Medicare Other | Admitting: Internal Medicine

## 2017-02-17 NOTE — Progress Notes (Signed)
Follow-up Outpatient Visit Date: 02/19/2017  Primary Care Provider: Kathrine Haddock, NP [address] [address]  Chief Complaint: Shortness of breath and swelling  HPI:  Sara Zhang is a 47 y.o. year-old female with history of systolic heart failure (LVEF reportedly 25-35% in 2014 but normalized on echo in 06/2016), hypertension, hyperlipidemia, and type 2 diabetes mellitus, who presents for follow-up of heart failure. I last saw Sara Zhang in 9/018, at which time she was doing well with the exception of heavy vaginal bleeding. She underwent hysteroscopy and polypectomy at [facility] in December.  Today, Sara Zhang reports that she has been more short of breath following her hysteroscopy and polypectomy.  She also feels as though she has been retaining some fluid in her legs.  She notes occasional tightening across the chest when she is exerting herself or struggling to breathe.  She has been compliant with her medications, though she was instructed by a different provider to decrease furosemide to 40 mg daily from twice daily in order to avoid kidney problems.  She has only sporadically taken a second daily dose of furosemide for leg swelling.  When she does this, she feels as though her breathing is better.  Sara Zhang continues to have chronic two-pillow orthopnea.  She denies palpitations and lightheadedness.  Vaginal bleeding had stopped up until a day or two ago, when she noticed a small amount of bleeding.  --------------------------------------------------------------------------------------------------  Cardiovascular History & Procedures: Cardiovascular Problems:  Chronic systolic heart failure with normalization of LVEF  Risk Factors:  Hypertension, hyperlipidemia, diabetes mellitus, and obesity  Cath/PCI:  None  CV Surgery:  None  EP Procedures and Devices:  None  Non-Invasive Evaluation(s):  TTE (06/10/16): Normal LV size and function with LVEF of 55-60%.  Normal diastolic parameters. Mild MR. Normal RV size and function. Normal pulmonary artery pressure.  Report of LVEF 25-35% with global hypokinesis by outside echo on her before 03/04/12  Recent CV Pertinent Labs: Lab Results  Component Value Date   CHOL 118 12/09/2016   CHOL 137 08/02/2015   HDL 25 (L) 12/09/2016   LDLCALC 80 12/09/2016   TRIG 66 12/09/2016   TRIG 80 08/02/2015   BNP 51.0 12/16/2015   K 4.7 12/09/2016   K 4.2 09/25/2013   BUN 15 12/09/2016   BUN 12 09/25/2013   CREATININE 1.27 (H) 12/09/2016   CREATININE 0.75 09/25/2013    Past medical and surgical history were reviewed and updated in EPIC.  Current Meds  Medication Sig  . acetaminophen (TYLENOL) 650 MG CR tablet Take 650 mg by mouth.  Marland Kitchen atenolol (TENORMIN) 25 MG tablet Take 1 tablet (25 mg total) by mouth daily.  Marland Kitchen atorvastatin (LIPITOR) 10 MG tablet Take 1 tablet (10 mg total) by mouth daily.  . blood glucose meter kit and supplies KIT Dispense based on patient and insurance preference. Use up to four times daily as directed. (FOR ICD-9 250.00, 250.01).  . CONTOUR NEXT TEST test strip TEST BLOOD SUGAR UP TO  once daily  . doxycycline (VIBRAMYCIN) 100 MG capsule Take 100 mg by mouth 2 (two) times daily.  . Exenatide ER (BYDUREON BCISE) 2 MG/0.85ML AUIJ Inject 2 mg into the skin once a week.  . ferrous sulfate 325 (65 FE) MG tablet Take 1 tablet (325 mg total) by mouth daily with breakfast.  . furosemide (LASIX) 40 MG tablet Take 40 mg by mouth daily.   Marland Kitchen glipiZIDE (GLUCOTROL) 10 MG tablet Take 1 tablet (10 mg total) by  mouth 2 (two) times daily before a meal.  . ibuprofen (ADVIL,MOTRIN) 600 MG tablet Take 600 mg by mouth.  . Insulin Glargine (TOUJEO SOLOSTAR) 300 UNIT/ML SOPN Inject 50 Units into the skin at bedtime.  . Insulin Pen Needle 32G X 4 MM MISC 1 Units by Does not apply route every morning. Pen needles  . norethindrone (AYGESTIN) 5 MG tablet Take 3 tablets (15 mg total) by mouth daily.  . OXYGEN  Inhale 2 L into the lungs daily.    Allergies: Patient has no known allergies.  Social History   Socioeconomic History  . Marital status: Single    Spouse name: Not on file  . Number of children: Not on file  . Years of education: Not on file  . Highest education level: Not on file  Social Needs  . Financial resource strain: Not on file  . Food insecurity - worry: Not on file  . Food insecurity - inability: Not on file  . Transportation needs - medical: Not on file  . Transportation needs - non-medical: Not on file  Occupational History  . Not on file  Tobacco Use  . Smoking status: Never Smoker  . Smokeless tobacco: Former Network engineer and Sexual Activity  . Alcohol use: Yes    Alcohol/week: 0.0 oz    Comment: 1-2 beers/month  . Drug use: No  . Sexual activity: Yes    Birth control/protection: None  Other Topics Concern  . Not on file  Social History Narrative  . Not on file    Family History  Problem Relation Age of Onset  . Diabetes Mother   . Hyperlipidemia Mother   . Hypertension Mother   . Stroke Mother   . Arthritis Father   . Asthma Father   . Diabetes Father   . Hypertension Father   . Hypertension Brother   . Diabetes Brother   . Diabetes Maternal Grandmother   . Hypertension Maternal Grandmother   . Diabetes Maternal Grandfather   . Hypertension Maternal Grandfather   . Diabetes Paternal Grandmother   . Hypertension Paternal Grandmother   . Heart attack Maternal Uncle     Review of Systems: A 12-system review of systems was performed and was negative except as noted in the HPI.  --------------------------------------------------------------------------------------------------  Physical Exam: BP (!) 154/90 (BP Location: Left Arm, Patient Position: Sitting, Cuff Size: Large)   Pulse 85   Ht '5\' 8"'$  (1.727 m)   Wt (!) 380 lb (172.4 kg)   BMI 57.78 kg/m   General: Morbidly obese woman, seated comfortably in the exam room. HEENT: No  conjunctival pallor or scleral icterus. Moist mucous membranes.  OP clear. Neck: Supple without lymphadenopathy, thyromegaly, JVD, or HJR. lungs: Normal work of breathing. Clear to auscultation bilaterally without wheezes or crackles. Heart: Distant heart sounds.  Regular rate and rhythm without murmurs, rubs, or gallops.  Unable to assess PMI due to body habitus. Abd: Bowel sounds present.  Obese with mild lower abdominal tenderness.  No rebound or guarding.  Unable to assess HSM due to body habitus. Ext: Trace pretibial edema bilaterally. Skin: Warm and dry without rash.  EKG: Normal sinus rhythm with first-degree AV block (PR interval 210 ms).  Low voltage QRS.  Otherwise, no significant abnormalities.  Lab Results  Component Value Date   WBC 7.2 12/09/2016   HGB 9.8 (L) 12/09/2016   HCT 33.0 (L) 12/09/2016   MCV 76 (L) 12/09/2016   PLT 453 (H) 12/09/2016    Lab  Results  Component Value Date   NA 144 12/09/2016   K 4.7 12/09/2016   CL 102 12/09/2016   CO2 26 12/09/2016   BUN 15 12/09/2016   CREATININE 1.27 (H) 12/09/2016   GLUCOSE 164 (H) 12/09/2016   ALT 8 12/09/2016    Lab Results  Component Value Date   CHOL 118 12/09/2016   HDL 25 (L) 12/09/2016   LDLCALC 80 12/09/2016   TRIG 66 12/09/2016    --------------------------------------------------------------------------------------------------  ASSESSMENT AND PLAN: Chest pain and shortness of breath with chronic diastolic heart failure. Shortness of breath has worsened since our last visit.  I suspect some of this may be due to de-escalation of furosemide, though the patient's weight is relatively stable.  She has also had some chest discomfort associated with her shortness of breath.  She has not undergone previous ischemia evaluation.  Cardiac risk factors include hypertension, hyperlipidemia, diabetes mellitus, and morbid obesity.  Her morbid obesity limits our evaluation options.  I do not think she would be an  acceptable candidate for stress echocardiography or SPECT imaging.  We have agreed to increase furosemide back to 40 mg twice daily as well as to increase lisinopril to 40 mg daily due to suboptimally controlled blood pressure.  I will check a basic metabolic panel today and again in 2 weeks.  We will also check a CBC today to make sure she has not developed worsening anemia given the aforementioned symptoms.  If improved blood pressure control and diuresis do not improve her symptoms, I will refer her to [facility] for myocardial PET/CT, as I believe this would give [place] the the best sensitivity and specificity.  Essential hypertension Blood pressure suboptimally controlled today.  This very well could be contributing to some of her symptoms.  We will increase furosemide to 40 mg twice daily and lisinopril to 40 mg daily.  I will check a BMP today and again in about 2 weeks.  Follow-up: Return to clinic in 1 month.  Nelva Bush, MD 02/19/2017 11:25 AM

## 2017-02-19 ENCOUNTER — Encounter: Payer: Self-pay | Admitting: Internal Medicine

## 2017-02-19 ENCOUNTER — Ambulatory Visit (INDEPENDENT_AMBULATORY_CARE_PROVIDER_SITE_OTHER): Payer: Medicare Other | Admitting: Internal Medicine

## 2017-02-19 VITALS — BP 154/90 | HR 85 | Ht 68.0 in | Wt 380.0 lb

## 2017-02-19 DIAGNOSIS — I10 Essential (primary) hypertension: Secondary | ICD-10-CM | POA: Diagnosis not present

## 2017-02-19 DIAGNOSIS — R079 Chest pain, unspecified: Secondary | ICD-10-CM

## 2017-02-19 DIAGNOSIS — R0602 Shortness of breath: Secondary | ICD-10-CM | POA: Diagnosis not present

## 2017-02-19 DIAGNOSIS — I5032 Chronic diastolic (congestive) heart failure: Secondary | ICD-10-CM

## 2017-02-19 DIAGNOSIS — R609 Edema, unspecified: Secondary | ICD-10-CM | POA: Diagnosis not present

## 2017-02-19 MED ORDER — LISINOPRIL 40 MG PO TABS: 40.0000 mg | ORAL_TABLET | Freq: Every day | ORAL | 3 refills | Status: DC

## 2017-02-19 MED ORDER — FUROSEMIDE 40 MG PO TABS: 40.0000 mg | ORAL_TABLET | Freq: Two times a day (BID) | ORAL | 3 refills | Status: DC

## 2017-02-19 NOTE — Patient Instructions (Signed)
Medication Instructions:  Your physician has recommended you make the following change in your medication:  1- INCREASE Furosemide 40 mg by mouth two times a day. 2- Lisinopril 40 mg by mouth once a day.   Labwork: Your physician recommends that you return for lab work in: TODAY (BMET, CBC).  Your physician recommends that you return for lab work in: BMET IN 2 WEEKS AT THE MEDICAL MALL. - ON March 06, 2017.  - Please go to the [facility]. You will check in at the front desk to the right as you walk into the atrium. Valet Parking is offered if needed.    Testing/Procedures: none  Follow-Up: Your physician recommends that you schedule a follow-up appointment in: 1 MONTH WITH DR END.  If you need a refill on your cardiac medications before your next appointment, please call your pharmacy.

## 2017-02-20 LAB — CBC
HEMOGLOBIN: 8.9 g/dL — AB (ref 11.1–15.9)
Hematocrit: 30.3 % — ABNORMAL LOW (ref 34.0–46.6)
MCH: 22.4 pg — AB (ref 26.6–33.0)
MCHC: 29.4 g/dL — AB (ref 31.5–35.7)
MCV: 76 fL — ABNORMAL LOW (ref 79–97)
Platelets: 400 10*3/uL — ABNORMAL HIGH (ref 150–379)
RBC: 3.97 x10E6/uL (ref 3.77–5.28)
RDW: 16.6 % — ABNORMAL HIGH (ref 12.3–15.4)
WBC: 6.3 10*3/uL (ref 3.4–10.8)

## 2017-02-20 LAB — BASIC METABOLIC PANEL
BUN/Creatinine Ratio: 19 (ref 9–23)
BUN: 18 mg/dL (ref 6–24)
CALCIUM: 8 mg/dL — AB (ref 8.7–10.2)
CHLORIDE: 103 mmol/L (ref 96–106)
CO2: 25 mmol/L (ref 20–29)
Creatinine, Ser: 0.93 mg/dL (ref 0.57–1.00)
GFR, EST AFRICAN AMERICAN: 84 mL/min/{1.73_m2} (ref >59)
GFR, EST NON AFRICAN AMERICAN: 73 mL/min/{1.73_m2} (ref >59)
Glucose: 142 mg/dL — ABNORMAL HIGH (ref 65–99)
POTASSIUM: 5 mmol/L (ref 3.5–5.2)
SODIUM: 141 mmol/L (ref 134–144)

## 2017-03-07 ENCOUNTER — Other Ambulatory Visit: Payer: Self-pay

## 2017-03-07 MED ORDER — INSULIN GLARGINE 300 UNIT/ML ~~LOC~~ SOPN
50.0000 [IU] | PEN_INJECTOR | Freq: Every day | SUBCUTANEOUS | 6 refills | Status: DC
Start: 2017-03-07 — End: 2017-08-20

## 2017-03-07 NOTE — Telephone Encounter (Signed)
Patient last seen 12/2016 and has f/up 03/17/17.

## 2017-03-17 ENCOUNTER — Ambulatory Visit (INDEPENDENT_AMBULATORY_CARE_PROVIDER_SITE_OTHER): Payer: Medicare Other | Admitting: Unknown Physician Specialty

## 2017-03-17 ENCOUNTER — Encounter: Payer: Self-pay | Admitting: Unknown Physician Specialty

## 2017-03-17 DIAGNOSIS — D500 Iron deficiency anemia secondary to blood loss (chronic): Secondary | ICD-10-CM

## 2017-03-17 DIAGNOSIS — E1165 Type 2 diabetes mellitus with hyperglycemia: Secondary | ICD-10-CM

## 2017-03-17 DIAGNOSIS — I5022 Chronic systolic (congestive) heart failure: Secondary | ICD-10-CM

## 2017-03-17 DIAGNOSIS — I110 Hypertensive heart disease with heart failure: Secondary | ICD-10-CM | POA: Diagnosis not present

## 2017-03-17 DIAGNOSIS — E782 Mixed hyperlipidemia: Secondary | ICD-10-CM | POA: Diagnosis not present

## 2017-03-17 LAB — BAYER DCA HB A1C WAIVED: HB A1C (BAYER DCA - WAIVED): 8.5 % — ABNORMAL HIGH (ref <7.0)

## 2017-03-17 NOTE — Assessment & Plan Note (Addendum)
Not to goal today.  Dr. Okey Dupre recently incresed her BP meds.  Recheck here in 3 months and with Dr End next week

## 2017-03-17 NOTE — Assessment & Plan Note (Addendum)
Hgb A1C is 8.5.  This is an improvement from previous.   Encouraged compliance as mises insulin 1-2 times/week therefore changing medication will not likely be effective..  Diet changes discussed

## 2017-03-17 NOTE — Assessment & Plan Note (Signed)
Unable to get labs today.  Continue with Atorvastatin

## 2017-03-17 NOTE — Progress Notes (Signed)
BP (!) 152/93 (BP Location: Left Arm, Cuff Size: Large)   Pulse 80   Temp 98.3 F (36.8 C) (Oral)   Ht 5' 7.5" (1.715 m)   Wt (!) 378 lb 9.6 oz (171.7 kg)   SpO2 95%   BMI 58.42 kg/m    Subjective:    Patient ID: Sara Zhang, female    DOB: 1970-10-23, 47 y.o.   MRN: [id number]  HPI: LAROSA MINASSIAN is a 47 y.o. female  Chief Complaint  Patient presents with  . Diabetes    pt states last eye exam in chart is correct  . Hyperlipidemia  . Hypertension   Diabetes: Takes 50 units is [place] in the evening Using medications without difficulties No hypoglycemic episodes No hyperglycemic episodes Feet problems: none Blood Sugars averaging: Running in the 300's when she "messes up" and drinks soday.  eye exam within last year Last Hgb A1C: 9.1  Hypertension  Using medications without difficulty Average home BPs: Not checking   Using medication without problems or lightheadedness No chest pain with exertion or shortness of breath No Edema  Elevated Cholesterol Using medications without problems No Muscle aches  Diet: Exercise: Drinks one soft drink/day  CHF O2 dependent.  Seeing Dr. Okey Dupre  Anemia For heavy menstrual bleeding.  Saw gyn for hysteroscopy.  Has an IUD  Relevant past medical, surgical, family and social history reviewed and updated as indicated. Interim medical history since our last visit reviewed. Allergies and medications reviewed and updated.  Review of Systems  Per HPI unless specifically indicated above     Objective:    BP (!) 152/93 (BP Location: Left Arm, Cuff Size: Large)   Pulse 80   Temp 98.3 F (36.8 C) (Oral)   Ht 5' 7.5" (1.715 m)   Wt (!) 378 lb 9.6 oz (171.7 kg)   SpO2 95%   BMI 58.42 kg/m   Wt Readings from Last 3 Encounters:  03/17/17 (!) 378 lb 9.6 oz (171.7 kg)  02/19/17 (!) 380 lb (172.4 kg)  12/09/16 (!) 378 lb (171.5 kg)    Physical Exam  Constitutional: She is oriented to person, place, and time. She appears  well-developed and well-nourished. No distress.  HENT:  Head: Normocephalic and atraumatic.  Eyes: Conjunctivae and lids are normal. Right eye exhibits no discharge. Left eye exhibits no discharge. No scleral icterus.  Neck: Normal range of motion. Neck supple. No JVD present. Carotid bruit is not present.  Cardiovascular: Normal rate, regular rhythm and normal heart sounds.  Pulmonary/Chest: She has decreased breath sounds.  Pulse ox readings drop off of O2  Abdominal: Normal appearance. There is no splenomegaly or hepatomegaly.  Musculoskeletal: Normal range of motion.  Neurological: She is alert and oriented to person, place, and time.  Skin: Skin is warm, dry and intact. No rash noted. No pallor.  Psychiatric: She has a normal mood and affect. Her behavior is normal. Judgment and thought content normal.    Results for orders placed or performed in visit on 02/19/17  Basic metabolic panel  Result Value Ref Range   Glucose 142 (H) 65 - 99 mg/dL   BUN 18 6 - 24 mg/dL   Creatinine, Ser 7.41 0.57 - 1.00 mg/dL   GFR calc non Af Amer 73 >59 mL/min/1.73   GFR calc Af Amer 84 >59 mL/min/1.73   BUN/Creatinine Ratio 19 9 - 23   Sodium 141 134 - 144 mmol/L   Potassium 5.0 3.5 - 5.2 mmol/L   Chloride  103 96 - 106 mmol/L   CO2 25 20 - 29 mmol/L   Calcium 8.0 (L) 8.7 - 10.2 mg/dL  CBC  Result Value Ref Range   WBC 6.3 3.4 - 10.8 x10E3/uL   RBC 3.97 3.77 - 5.28 x10E6/uL   Hemoglobin 8.9 (L) 11.1 - 15.9 g/dL   Hematocrit 16.1 (L) 09.6 - 46.6 %   MCV 76 (L) 79 - 97 fL   MCH 22.4 (L) 26.6 - 33.0 pg   MCHC 29.4 (L) 31.5 - 35.7 g/dL   RDW 04.5 (H) 40.9 - 81.1 %   Platelets 400 (H) 150 - 379 x10E3/uL      Assessment & Plan:   Problem List Items Addressed This Visit      Unprioritized   Hyperlipidemia    Unable to get labs today.  Continue with Atorvastatin      Hypertensive heart disease with congestive heart failure (HCC)    Not to goal today.  Dr. Okey Dupre recently incresed her BP  meds.  Recheck here in 3 months and with Dr End next week      Relevant Orders   Bayer DCA Hb A1c Waived   Comprehensive metabolic panel   Iron deficiency anemia due to chronic blood loss    Treated at OB-gyn.  They were unable to obtain her blood today for f/u due to poor veins      Relevant Orders   CBC With Differential/Platelet   Poorly controlled type 2 diabetes mellitus (HCC)    Hgb A1C is 8.5.  This is an improvement from previous.   Encouraged compliance as mises insulin 1-2 times/week therefore changing medication will not likely be effective..  Diet changes discussed          Follow up plan: Return in about 3 months (around 06/14/2017).

## 2017-03-17 NOTE — Assessment & Plan Note (Signed)
Treated at OB-gyn.  They were unable to obtain her blood today for f/u due to poor veins

## 2017-03-20 ENCOUNTER — Other Ambulatory Visit: Payer: Medicare Other

## 2017-03-20 DIAGNOSIS — I5022 Chronic systolic (congestive) heart failure: Secondary | ICD-10-CM

## 2017-03-20 DIAGNOSIS — D500 Iron deficiency anemia secondary to blood loss (chronic): Secondary | ICD-10-CM | POA: Diagnosis not present

## 2017-03-20 DIAGNOSIS — I110 Hypertensive heart disease with heart failure: Secondary | ICD-10-CM

## 2017-03-20 LAB — CBC WITH DIFFERENTIAL/PLATELET
HEMATOCRIT: 35.2 % (ref 34.0–46.6)
HEMOGLOBIN: 10.3 g/dL — AB (ref 11.1–15.9)
LYMPHS ABS: 2.2 10*3/uL (ref 0.7–3.1)
LYMPHS: 24 %
MCH: 23.6 pg — ABNORMAL LOW (ref 26.6–33.0)
MCHC: 29.3 g/dL — ABNORMAL LOW (ref 31.5–35.7)
MCV: 81 fL (ref 79–97)
MID (ABSOLUTE): 1.6 10*3/uL (ref 0.1–1.6)
MID: 17 %
Neutrophils Absolute: 5.3 10*3/uL (ref 1.4–7.0)
Neutrophils: 59 %
Platelets: 468 10*3/uL — ABNORMAL HIGH (ref 150–379)
RBC: 4.37 x10E6/uL (ref 3.77–5.28)
RDW: 16.5 % — ABNORMAL HIGH (ref 12.3–15.4)
WBC: 9.1 10*3/uL (ref 3.4–10.8)

## 2017-03-21 ENCOUNTER — Other Ambulatory Visit
Admission: RE | Admit: 2017-03-21 | Discharge: 2017-03-21 | Disposition: A | Discharge status: Home / Self Care | Payer: Medicare Other | Source: Ambulatory Visit | Attending: Internal Medicine | Admitting: Internal Medicine

## 2017-03-21 DIAGNOSIS — I5032 Chronic diastolic (congestive) heart failure: Secondary | ICD-10-CM | POA: Insufficient documentation

## 2017-03-21 DIAGNOSIS — R0602 Shortness of breath: Secondary | ICD-10-CM | POA: Insufficient documentation

## 2017-03-21 DIAGNOSIS — R079 Chest pain, unspecified: Secondary | ICD-10-CM | POA: Diagnosis not present

## 2017-03-21 DIAGNOSIS — I10 Essential (primary) hypertension: Secondary | ICD-10-CM | POA: Insufficient documentation

## 2017-03-21 LAB — BASIC METABOLIC PANEL
ANION GAP: 12 (ref 5–15)
BUN: 18 mg/dL (ref 6–20)
CHLORIDE: 100 mmol/L — AB (ref 101–111)
CO2: 27 mmol/L (ref 22–32)
Calcium: 8.4 mg/dL — ABNORMAL LOW (ref 8.9–10.3)
Creatinine, Ser: 1.02 mg/dL — ABNORMAL HIGH (ref 0.44–1.00)
GFR calc non Af Amer: >60 mL/min (ref >60)
GLUCOSE: 281 mg/dL — AB (ref 65–99)
Potassium: 4.3 mmol/L (ref 3.5–5.1)
Sodium: 139 mmol/L (ref 135–145)

## 2017-03-21 LAB — COMPREHENSIVE METABOLIC PANEL
ALT: 9 IU/L (ref 0–32)
AST: 11 IU/L (ref 0–40)
Albumin/Globulin Ratio: 0.9 — ABNORMAL LOW (ref 1.2–2.2)
Albumin: 3.6 g/dL (ref 3.5–5.5)
Alkaline Phosphatase: 103 IU/L (ref 39–117)
BUN/Creatinine Ratio: 16 (ref 9–23)
BUN: 16 mg/dL (ref 6–24)
Bilirubin Total: 0.2 mg/dL (ref 0.0–1.2)
CALCIUM: 8.4 mg/dL — AB (ref 8.7–10.2)
CO2: 26 mmol/L (ref 20–29)
CREATININE: 1.01 mg/dL — AB (ref 0.57–1.00)
Chloride: 100 mmol/L (ref 96–106)
GFR calc non Af Amer: 66 mL/min/{1.73_m2} (ref >59)
GFR, EST AFRICAN AMERICAN: 76 mL/min/{1.73_m2} (ref >59)
GLOBULIN, TOTAL: 3.9 g/dL (ref 1.5–4.5)
Glucose: 126 mg/dL — ABNORMAL HIGH (ref 65–99)
Potassium: 4.9 mmol/L (ref 3.5–5.2)
Sodium: 141 mmol/L (ref 134–144)
TOTAL PROTEIN: 7.5 g/dL (ref 6.0–8.5)

## 2017-03-25 ENCOUNTER — Encounter: Payer: Self-pay | Admitting: Nurse Practitioner

## 2017-03-25 ENCOUNTER — Ambulatory Visit (INDEPENDENT_AMBULATORY_CARE_PROVIDER_SITE_OTHER): Payer: Medicare Other | Admitting: Nurse Practitioner

## 2017-03-25 VITALS — BP 130/78 | HR 87 | Ht 68.5 in | Wt 370.2 lb

## 2017-03-25 DIAGNOSIS — D509 Iron deficiency anemia, unspecified: Secondary | ICD-10-CM

## 2017-03-25 DIAGNOSIS — N182 Chronic kidney disease, stage 2 (mild): Secondary | ICD-10-CM

## 2017-03-25 DIAGNOSIS — E119 Type 2 diabetes mellitus without complications: Secondary | ICD-10-CM | POA: Diagnosis not present

## 2017-03-25 DIAGNOSIS — I5032 Chronic diastolic (congestive) heart failure: Secondary | ICD-10-CM

## 2017-03-25 DIAGNOSIS — I10 Essential (primary) hypertension: Secondary | ICD-10-CM | POA: Diagnosis not present

## 2017-03-25 DIAGNOSIS — Z794 Long term (current) use of insulin: Secondary | ICD-10-CM | POA: Diagnosis not present

## 2017-03-25 NOTE — Patient Instructions (Signed)
Medication Instructions:  Your physician recommends that you continue on your current medications as directed. Please refer to the Current Medication list given to you today.   Labwork: NONE  Testing/Procedures: NONE  Follow-Up: Your physician recommends that you schedule a follow-up appointment in: 2 MONTHS WITH DR END.  If you need a refill on your cardiac medications before your next appointment, please call your pharmacy.   

## 2017-03-25 NOTE — Progress Notes (Signed)
Office Visit    Patient Name: Sara Zhang Date of Encounter: 03/25/2017  Primary Care Provider:  Kathrine Haddock, NP Primary Cardiologist:  Nelva Bush, MD  Chief Complaint    47 y/o ? with a history of LV dysfunction with subsequent normalization, diastolic heart failure, stage II kidney disease, diabetes, hypertension, hyperlipidemia, microcytic anemia, sleep apnea, and obesity who presents for follow-up.  Past Medical History    Past Medical History:  Diagnosis Date  . Chronic diastolic CHF (congestive heart failure) ([place])    a. 02/2012 Echo: EF 25-35%, glob HK; b. 06/2016 Echo: EF 55-60%, mild MR, nl PASP.  Marland Kitchen CKD (chronic kidney disease), stage II   . Diabetes mellitus without complication ([place])   . Extreme obesity   . Hyperlipidemia   . Hypertension   . Hypoxia   . Microcytic anemia   . Sleep apnea   . Vaginal bleeding    a. 01/2017 s/p hyteroscopy and polypectomy [facility]).  . Vitamin D deficiency    Past Surgical History:  Procedure Laterality Date  . CESAREAN SECTION    . CHOLECYSTECTOMY    . TONSILLECTOMY AND ADENOIDECTOMY    . TOOTH EXTRACTION  01/2016    Allergies  No Known Allergies  History of Present Illness    47 y/o ? with the above past medical history including LV dysfunction in 2014 with EF of 25-35% but with subsequent normalization of LV function by echo in May 2018.  She continues to have issues with volume and diastolic heart failure along with hypertension, hyperlipidemia, poorly controlled diabetes, microcytic anemia, sleep apnea, and obesity.  She was recently seen in clinic in mid January with complaints of progressive dyspnea and lower extremity edema.  She was volume overloaded on exam.  This occurred after de-escalation of her Lasix dose.  She was also hypertensive at that visit.  Lasix was increased to 40 mg twice daily and lisinopril was increased to 40 mg daily.  She was supposed to have a follow-up basic metabolic panel a week later  but she missed this and instead had it performed on February 14.  Electrolytes and renal function was stable.  She has been tolerating the higher doses of Lasix and lisinopril and her weight is down 8 pounds since her last visit.  She has noticed significant improvement in degree of dyspnea and almost complete resolution of lower extremity edema.  She remains sedentary.  She eats out at least 3 times per week, often at [facility] [employer] or [employer].  She has chronic stable 2 pillow orthopnea but denies PND, early satiety, change in abdominal girth, dizziness, syncope, chest pain, or palpitations.  Home Medications    Prior to Admission medications   Medication Sig Start Date End Date Taking? Authorizing Provider  acetaminophen (TYLENOL) 650 MG CR tablet Take 650 mg by mouth. 01/22/17  Yes [provider]  atenolol (TENORMIN) 25 MG tablet Take 1 tablet (25 mg total) by mouth daily. 12/03/16  Yes Kathrine Haddock, NP  atorvastatin (LIPITOR) 10 MG tablet Take 1 tablet (10 mg total) by mouth daily. 12/03/16  Yes Kathrine Haddock, NP  blood glucose meter kit and supplies KIT Dispense based on patient and insurance preference. Use up to four times daily as directed. (FOR ICD-9 250.00, 250.01). 02/09/16  Yes Kathrine Haddock, NP  CONTOUR NEXT TEST test strip TEST BLOOD SUGAR UP TO  once daily 08/12/16  Yes [provider]  Exenatide ER (BYDUREON BCISE) 2 MG/0.85ML AUIJ Inject  2 mg into the skin once a week. 05/21/16  Yes Kathrine Haddock, NP  ferrous sulfate 325 (65 FE) MG tablet Take 1 tablet (325 mg total) by mouth daily with breakfast. 05/27/16  Yes Kathrine Haddock, NP  furosemide (LASIX) 40 MG tablet Take 1 tablet (40 mg total) by mouth 2 (two) times daily. 02/19/17 05/20/17 Yes End, Harrell Gave, MD  glipiZIDE (GLUCOTROL) 10 MG tablet Take 1 tablet (10 mg total) by mouth 2 (two) times daily before a meal. 05/21/16  Yes Kathrine Haddock, NP  ibuprofen (ADVIL,MOTRIN) 600 MG tablet Take 600 mg by mouth.  01/22/17  Yes [provider]  Insulin Glargine (TOUJEO SOLOSTAR) 300 UNIT/ML SOPN Inject 50 Units into the skin at bedtime. 03/07/17  Yes Kathrine Haddock, NP  Insulin Pen Needle 32G X 4 MM MISC 1 Units by Does not apply route every morning. Pen needles 08/21/16  Yes Kathrine Haddock, NP  levonorgestrel (MIRENA) 20 MCG/24HR IUD 1 each by Intrauterine route once.   Yes [provider]  lisinopril (PRINIVIL,ZESTRIL) 40 MG tablet Take 1 tablet (40 mg total) by mouth daily. 02/19/17 05/20/17 Yes End, Harrell Gave, MD  OXYGEN Inhale 2 L into the lungs daily.   Yes [provider]    Review of Systems    Symptomatically improved since her last visit here in January.  Weight is down 8 pounds and she has had a significant improvement in lower extremity edema.  Breathing much better.  No chest pain, palpitations, PND, dizziness, syncope, or early satiety.  All other systems reviewed and are otherwise negative except as noted above.  Physical Exam    VS:  BP 130/78 (BP Location: Left Arm, Patient Position: Sitting, Cuff Size: Large)   Pulse 87   Ht 5' 8.5" (1.74 m)   Wt (!) 370 lb 4 oz (167.9 kg)   BMI 55.48 kg/m  , BMI Body mass index is 55.48 kg/m. GEN: Obese, in no acute distress.  HEENT: normal.  Neck: Supple, obese, difficult to gauge JVP.  No carotid bruits, or masses. Cardiac: RRR, distant, 2/6 systolic murmur at the left upper sternal border, no rubs, or gallops. No clubbing, cyanosis.  Trace bilateral ankle edema.  Radials/DP/PT 1+ and equal bilaterally.  Respiratory:  Respirations regular and unlabored, clear to auscultation bilaterally. GI: Obese, soft, nontender, nondistended, BS + x 4. MS: no deformity or atrophy. Skin: warm and dry, no rash. Neuro:  Strength and sensation are intact. Psych: Normal affect.  Accessory Clinical Findings    ECG -regular sinus rhythm, 87, no acute ST or T changes.  Lab Results  Component Value Date   CREATININE 1.02 (H)  03/21/2017   BUN 18 03/21/2017   NA 139 03/21/2017   K 4.3 03/21/2017   CL 100 (L) 03/21/2017   CO2 27 03/21/2017    Lab Results  Component Value Date   WBC 9.1 03/20/2017   HGB 10.3 (L) 03/20/2017   HCT 35.2 03/20/2017   MCV 81 03/20/2017   PLT 468 (H) 03/20/2017    Assessment & Plan    1.  Chronic diastolic congestive heart failure: Patient was recently seen in clinic a month ago with volume overload and dyspnea.  Lasix dose was escalated to 40 mg twice daily at that time and she is down 8 pounds since that visit.  Blood pressure is also much better controlled on lisinopril 40 mg daily and atenolol 25 mg daily.  In the setting of weight loss and blood pressure management, she has had  significant clinical improvement.  She has some degree of chronic, stable dyspnea on exertion but overall, she is symptomatically improved.  She has only minimal lower extremity edema today.  She eats out on a regular basis and we discussed the importance of sodium restriction and the benefits of avoiding eating out with more of a focus on preparing her own foods at home.  Basic metabolic panel last week showed stable renal function and electrolytes.  I will continue her current dose of Lasix at 40 mg twice daily.  2.  Essential hypertension: Improved with diuresis and higher dose of lisinopril.  No changes today.  Again, I stressed the importance of sodium avoidance especially in processed and restaurant food.  3.  Type 2 diabetes mellitus: Primary care note reviewed.  Patient reports improved compliance with oral meds and also insulin.  4.  Morbid obesity: She remains very sedentary with high caloric intake.  She does not currently have a plan to reduce her diet or increase her activity.  She would benefit from outpatient nutritional counseling.  5.  Microcytic anemia: H&H stable/improved since January.  6.  Stage II chronic kidney disease: Creatinine stable last week.  Continue current dose of ACE  inhibitor and diuretic.  7.  Disposition: Follow-up with Dr. Saunders Revel in 2 months or sooner if necessary.   Murray Hodgkins, NP 03/25/2017, 11:27 AM

## 2017-05-12 DIAGNOSIS — I10 Essential (primary) hypertension: Secondary | ICD-10-CM | POA: Diagnosis not present

## 2017-05-12 DIAGNOSIS — E1140 Type 2 diabetes mellitus with diabetic neuropathy, unspecified: Secondary | ICD-10-CM | POA: Diagnosis not present

## 2017-05-12 DIAGNOSIS — G4733 Obstructive sleep apnea (adult) (pediatric): Secondary | ICD-10-CM | POA: Diagnosis not present

## 2017-05-12 DIAGNOSIS — E785 Hyperlipidemia, unspecified: Secondary | ICD-10-CM | POA: Diagnosis not present

## 2017-05-26 ENCOUNTER — Other Ambulatory Visit: Payer: Self-pay

## 2017-05-26 MED ORDER — GLIPIZIDE 10 MG PO TABS: 10.0000 mg | ORAL_TABLET | Freq: Two times a day (BID) | ORAL | 1 refills | Status: DC

## 2017-05-26 MED ORDER — EXENATIDE ER 2 MG/0.85ML ~~LOC~~ AUIJ: 2.0000 mg | AUTO-INJECTOR | SUBCUTANEOUS | 12 refills | Status: DC

## 2017-05-26 NOTE — Telephone Encounter (Signed)
Patient last seen 03/17/17 and has f/up 06/16/17.

## 2017-06-10 ENCOUNTER — Other Ambulatory Visit: Payer: Self-pay

## 2017-06-10 MED ORDER — ATENOLOL 25 MG PO TABS: 25.0000 mg | ORAL_TABLET | Freq: Every day | ORAL | 1 refills | Status: DC

## 2017-06-10 MED ORDER — ATORVASTATIN CALCIUM 10 MG PO TABS: 10.0000 mg | ORAL_TABLET | Freq: Every day | ORAL | 1 refills | Status: DC

## 2017-06-10 NOTE — Telephone Encounter (Signed)
Patient last seen 03/17/17 and has appointment 06/16/17.

## 2017-06-11 ENCOUNTER — Ambulatory Visit (INDEPENDENT_AMBULATORY_CARE_PROVIDER_SITE_OTHER): Payer: Medicare Other | Admitting: Internal Medicine

## 2017-06-11 ENCOUNTER — Telehealth: Payer: Self-pay | Admitting: *Deleted

## 2017-06-11 ENCOUNTER — Encounter: Payer: Self-pay | Admitting: Internal Medicine

## 2017-06-11 VITALS — BP 150/80 | HR 96 | Ht 69.0 in | Wt 376.0 lb

## 2017-06-11 DIAGNOSIS — I5032 Chronic diastolic (congestive) heart failure: Secondary | ICD-10-CM | POA: Diagnosis not present

## 2017-06-11 DIAGNOSIS — I10 Essential (primary) hypertension: Secondary | ICD-10-CM | POA: Diagnosis not present

## 2017-06-11 MED ORDER — ATENOLOL 50 MG PO TABS: 50.0000 mg | ORAL_TABLET | Freq: Two times a day (BID) | ORAL | 3 refills | Status: DC

## 2017-06-11 MED ORDER — ATENOLOL 50 MG PO TABS: 50.0000 mg | ORAL_TABLET | Freq: Every day | ORAL | 3 refills | Status: DC

## 2017-06-11 NOTE — Telephone Encounter (Signed)
Still unable to get in touch with patient. Called pharmacy and had pharmacist change prescription to once a day. Rx changed on chart as well.  Will continue to try to reach patient.

## 2017-06-11 NOTE — Progress Notes (Signed)
Follow-up Outpatient Visit Date: 06/11/2017  Primary Care Provider: Kathrine Haddock, NP [address] [address]  Chief Complaint:   HPI:  Sara Zhang is a 47 y.o. year-old female with history of systolic heart failure (LVEF reportedly 25 to 35% in 2014 with subsequent normalization), HTN, HLD, and type II DM, who presents for follow-up of heart failure.  She was last seen in our office in February by Ignacia Bayley, NP, at which time she reported significant improvement in her shortness of breath and resolution of her leg swelling following escalation of furosemide and lisinopril at our previous visit in January.  Today, Sara Zhang reports feeling well.  Her dyspnea is stable to slightly improved since February.  She has intermittent mild swelling in her legs.  She has chronic 2 pillow orthopnea, unchanged, and is using CPAP regularly.  She denies chest pain and palpitations.  She notes rare mild uterine bleeding, markedly improved following endometrial ablation and IUD placement.  She has not been checking her blood pressure regularly at home but is concerned that it may be running high, as she has occasional headaches.  She has not been exercising or watching her diet.  --------------------------------------------------------------------------------------------------  Cardiovascular History & Procedures: Cardiovascular Problems:  Chronic systolic heart failure with normalization of LVEF  Risk Factors:  Hypertension, hyperlipidemia, diabetes mellitus, and obesity  Cath/PCI:  None  CV Surgery:  None  EP Procedures and Devices:  None  Non-Invasive Evaluation(s):  TTE (06/10/16): Normal LV size and function with LVEF of 55-60%. Normal diastolic parameters. Mild MR. Normal RV size and function. Normal pulmonary artery pressure.  Report of LVEF 25-35% with global hypokinesis by outside echo on her before 03/04/12.   Recent CV Pertinent Labs: Lab Results  Component Value  Date   CHOL 118 12/09/2016   CHOL 137 08/02/2015   HDL 25 (L) 12/09/2016   LDLCALC 80 12/09/2016   TRIG 66 12/09/2016   TRIG 80 08/02/2015   BNP 51.0 12/16/2015   K 4.3 03/21/2017   K 4.2 09/25/2013   BUN 18 03/21/2017   BUN 16 03/20/2017   BUN 12 09/25/2013   CREATININE 1.02 (H) 03/21/2017   CREATININE 0.75 09/25/2013    Past medical and surgical history were reviewed and updated in EPIC.  Current Meds  Medication Sig  . atorvastatin (LIPITOR) 10 MG tablet Take 1 tablet (10 mg total) by mouth daily.  . blood glucose meter kit and supplies KIT Dispense based on patient and insurance preference. Use up to four times daily as directed. (FOR ICD-9 250.00, 250.01).  . CONTOUR NEXT TEST test strip TEST BLOOD SUGAR UP TO  once daily  . Exenatide ER (BYDUREON BCISE) 2 MG/0.85ML AUIJ Inject 2 mg into the skin once a week.  . ferrous sulfate 325 (65 FE) MG tablet Take 1 tablet (325 mg total) by mouth daily with breakfast.  . glipiZIDE (GLUCOTROL) 10 MG tablet Take 1 tablet (10 mg total) by mouth 2 (two) times daily before a meal.  . Insulin Glargine (TOUJEO SOLOSTAR) 300 UNIT/ML SOPN Inject 50 Units into the skin at bedtime.  . Insulin Pen Needle 32G X 4 MM MISC 1 Units by Does not apply route every morning. Pen needles  . levonorgestrel (MIRENA) 20 MCG/24HR IUD 1 each by Intrauterine route once.  . OXYGEN Inhale 2 L into the lungs daily.  . [DISCONTINUED] atenolol (TENORMIN) 25 MG tablet Take 1 tablet (25 mg total) by mouth daily.    Allergies: Patient has no  known allergies.  Social History   Tobacco Use  . Smoking status: Never Smoker  . Smokeless tobacco: Former Network engineer Use Topics  . Alcohol use: Yes    Alcohol/week: 0.0 oz    Comment: 1-2 beers/month  . Drug use: No    Family History  Problem Relation Age of Onset  . Diabetes Mother   . Hyperlipidemia Mother   . Hypertension Mother   . Stroke Mother   . Arthritis Father   . Asthma Father   . Diabetes Father    . Hypertension Father   . Hypertension Brother   . Diabetes Brother   . Diabetes Maternal Grandmother   . Hypertension Maternal Grandmother   . Diabetes Maternal Grandfather   . Hypertension Maternal Grandfather   . Diabetes Paternal Grandmother   . Hypertension Paternal Grandmother   . Heart attack Maternal Uncle     Review of Systems: A 12-system review of systems was performed and was negative except as noted in the HPI.  --------------------------------------------------------------------------------------------------  Physical Exam: BP (!) 150/80 (BP Location: Left Arm, Patient Position: Sitting, Cuff Size: Large)   Pulse 96   Ht 5' 9"  (1.753 m)   Wt (!) 376 lb (170.6 kg)   SpO2 96%   BMI 55.53 kg/m    General: Morbidly obese woman, seated comfortably on exam table. HEENT: No conjunctival pallor or scleral icterus. Moist mucous membranes.  OP clear. Neck: Supple without lymphadenopathy, thyromegaly, JVD, or HJR, though evaluation is limited by body habitus. Lungs: Normal work of breathing. Clear to auscultation bilaterally without wheezes or crackles. Heart: Distant heart sounds.  Regularly regular without murmurs.  Unable to assess PMI due to body habitus. Abd: Bowel sounds present. Soft, NT/ND.  Unable to assess hepatosplenomegaly due to body habitus. Ext: No significant lower extremity edema. Skin: Warm and dry without rash.  Lab Results  Component Value Date   WBC 9.1 03/20/2017   HGB 10.3 (L) 03/20/2017   HCT 35.2 03/20/2017   MCV 81 03/20/2017   PLT 468 (H) 03/20/2017    Lab Results  Component Value Date   NA 139 03/21/2017   K 4.3 03/21/2017   CL 100 (L) 03/21/2017   CO2 27 03/21/2017   BUN 18 03/21/2017   CREATININE 1.02 (H) 03/21/2017   GLUCOSE 281 (H) 03/21/2017   ALT 9 03/20/2017    Lab Results  Component Value Date   CHOL 118 12/09/2016   HDL 25 (L) 12/09/2016   LDLCALC 80 12/09/2016   TRIG 66 12/09/2016     --------------------------------------------------------------------------------------------------  ASSESSMENT AND PLAN: HFpEF Symptoms are stable and likely largely driven by morbid obesity.  Sara Zhang appears grossly euvolemic, though volume assessment is challenging due to her weight.  We have agreed to continue her current dose of furosemide.  I encouraged her to exercise and lose weight.  Hypertension Blood pressure suboptimally controlled today.  We discussed escalation of atenolol versus adding another agent.  Sara Zhang would like to try the former.  We will therefore increase atenolol to 50 mg daily and have her return in 1 month for a blood pressure check.  Morbid obesity BMI greater than 50.  I encouraged weight loss through diet and exercise.  Follow-up: Return to clinic in 3 to 4 months.  Nelva Bush, MD 06/11/2017 12:31 PM

## 2017-06-11 NOTE — Patient Instructions (Signed)
Medication Instructions:  Your physician has recommended you make the following change in your medication:  1- INCREASE Atenolol to 50 mg by mouth two times a day.   Labwork: none  Testing/Procedures: none  Follow-Up: Your physician recommends that you schedule a follow-up appointment in: 1 MONTH FOR BLOOD PRESSURE CHECK.  Your physician recommends that you schedule a follow-up appointment in: 3-4 MONTHS WITH DR END OR APP.   If you need a refill on your cardiac medications before your next appointment, please call your pharmacy.

## 2017-06-11 NOTE — Telephone Encounter (Signed)
Attempted to reach patient x2. Voicemail not setup yet. Will try again later.

## 2017-06-11 NOTE — Telephone Encounter (Signed)
-----   Message from Yvonne Kendall, MD sent at 06/11/2017 12:37 PM EDT ----- Merrily Pew,  Can you touch base with Mrs. Harpham what her new atenolol dose?  She was on 25 mg daily, and I wanted to increase her to 50 mg daily.  It looks like her prescription now reads 50 mg twice daily; can you clarify that she should only take the 50 mg once daily?  Please let me know if any issues come up.  Thanks and sorry for the confusion.  Thayer Ohm

## 2017-06-16 ENCOUNTER — Ambulatory Visit: Payer: Medicare Other | Admitting: Unknown Physician Specialty

## 2017-06-20 ENCOUNTER — Encounter: Payer: Self-pay | Admitting: *Deleted

## 2017-06-20 NOTE — Telephone Encounter (Signed)
Attempted to reach patient. No voicemail box set up. Letter mailed to patient and sent to MyChart.

## 2017-06-24 DIAGNOSIS — H40003 Preglaucoma, unspecified, bilateral: Secondary | ICD-10-CM | POA: Diagnosis not present

## 2017-07-10 ENCOUNTER — Ambulatory Visit (INDEPENDENT_AMBULATORY_CARE_PROVIDER_SITE_OTHER): Payer: Medicare Other | Admitting: *Deleted

## 2017-07-10 VITALS — BP 139/92 | HR 71 | Ht 69.0 in | Wt 380.2 lb

## 2017-07-10 DIAGNOSIS — E119 Type 2 diabetes mellitus without complications: Secondary | ICD-10-CM | POA: Diagnosis not present

## 2017-07-10 DIAGNOSIS — H40003 Preglaucoma, unspecified, bilateral: Secondary | ICD-10-CM | POA: Diagnosis not present

## 2017-07-10 DIAGNOSIS — I10 Essential (primary) hypertension: Secondary | ICD-10-CM | POA: Diagnosis not present

## 2017-07-10 LAB — HM DIABETES EYE EXAM

## 2017-07-14 NOTE — Progress Notes (Signed)
1.) Reason for visit: BP check  2.) Name of MD requesting visit: Dr End*  3.) H&P: HTN**  4.) ROS related to problem: Patient here for blood pressure check. Denies shortness of breath or chest pain. States she has headache at times. She has checked her blood pressure some at a store blood pressure cuff and it is around 140's/90-100's. Today, BP 139/92, HR 71. Patient was increased to atenolol 50 mg daily at last office visit on 06/11/17. Her medication list was reviewed with patient and she verified she was taking medications as prescribed.   5.) Assessment and plan per MD: Advised patient to continue current medications at this time and will route to Dr End for review. She verbalized understanding.

## 2017-07-16 NOTE — Progress Notes (Signed)
BP mildly elevated.  I recommend continuing current medications and focusing on lifestyle modifications like weight loss and sodium restriction.  We will follow-up in the office as previously discussed.  Yvonne Kendall, MD [facility] Pager: [phone]

## 2017-07-17 ENCOUNTER — Telehealth: Payer: Self-pay | Admitting: *Deleted

## 2017-07-17 NOTE — Telephone Encounter (Signed)
BP mildly elevated.  I recommend continuing current medications and focusing on lifestyle modifications like weight loss and sodium restriction.  We will follow-up in the office as previously discussed.  Yvonne Kendall, MD [facility] Pager: [phone]  ----------------------------------------------------------------------- Patient verbalized understanding of Dr. Serita Kyle recommendations and is aware of next appointment date and time.

## 2017-07-17 NOTE — Telephone Encounter (Signed)
-----   Message from Yvonne Kendall, MD sent at 07/16/2017 12:48 PM EDT -----   ----- Message ----- From: Stann Mainland, RN Sent: 07/14/2017   8:06 AM To: Yvonne Kendall, MD

## 2017-08-13 ENCOUNTER — Ambulatory Visit (INDEPENDENT_AMBULATORY_CARE_PROVIDER_SITE_OTHER): Payer: Medicare Other

## 2017-08-13 VITALS — BP 132/80 | HR 78 | Temp 98.1°F | Resp 18 | Ht 68.0 in | Wt 379.2 lb

## 2017-08-13 DIAGNOSIS — Z0000 Encounter for general adult medical examination without abnormal findings: Secondary | ICD-10-CM | POA: Diagnosis not present

## 2017-08-13 DIAGNOSIS — Z1231 Encounter for screening mammogram for malignant neoplasm of breast: Secondary | ICD-10-CM | POA: Diagnosis not present

## 2017-08-13 DIAGNOSIS — Z1239 Encounter for other screening for malignant neoplasm of breast: Secondary | ICD-10-CM

## 2017-08-13 NOTE — Progress Notes (Signed)
Subjective:   Sara Zhang is a 47 y.o. female who presents for Medicare Annual (Subsequent) preventive examination.  Review of Systems:  Cardiac Risk Factors include: advanced age (>72mn, >>41women);dyslipidemia;diabetes mellitus;hypertension;obesity (BMI >30kg/m2)     Objective:     Vitals: BP 132/80 (BP Location: Left Arm)   Pulse 78   Temp 98.1 F (36.7 C) (Temporal)   Resp 18   Ht 5' 8"  (1.727 m)   Wt (!) 379 lb 3.2 oz (172 kg)   SpO2 98%   BMI 57.66 kg/m   Body mass index is 57.66 kg/m.  Advanced Directives 08/13/2017 08/08/2016 12/16/2015 11/20/2015 08/02/2015  Does Patient Have a Medical Advance Directive? No No No No No  Would patient like information on creating a medical advance directive? Yes (MAU/Ambulatory/Procedural Areas - Information given) Yes (MAU/Ambulatory/Procedural Areas - Information given) No - patient declined information - -    Tobacco Social History   Tobacco Use  Smoking Status Never Smoker  Smokeless Tobacco Former UEngineer, structuralgiven: Not Answered   Clinical Intake:  Pre-visit preparation completed: Yes  Pain : No/denies pain     Nutritional Status: BMI > 30  Obese Nutritional Risks: None Diabetes: Yes CBG done?: No Did pt. bring in CBG monitor from home?: No  How often do you need to have someone help you when you read instructions, pamphlets, or other written materials from your doctor or pharmacy?: 1 - Never What is the last grade level you completed in school?: GED  Interpreter Needed?: No  Information entered by :: Kieron Kantner,LPN   Past Medical History:  Diagnosis Date  . Chronic diastolic CHF (congestive heart failure) [place]    a. 02/2012 Echo: EF 25-35%, glob HK; b. 06/2016 Echo: EF 55-60%, mild MR, nl PASP.  .Marland KitchenCKD (chronic kidney disease), stage II   . Diabetes mellitus without complication ([place]   . Extreme obesity   . Hyperlipidemia   . Hypertension   . Hypoxia   . Microcytic anemia   . Sleep apnea    . Vaginal bleeding    a. 01/2017 s/p hyteroscopy and polypectomy ([facility].  . Vitamin D deficiency    Past Surgical History:  Procedure Laterality Date  . CESAREAN SECTION    . CHOLECYSTECTOMY    . DILATION AND CURETTAGE, DIAGNOSTIC / THERAPEUTIC    . TONSILLECTOMY AND ADENOIDECTOMY    . TOOTH EXTRACTION  01/2016   Family History  Problem Relation Age of Onset  . Diabetes Mother   . Hyperlipidemia Mother   . Hypertension Mother   . Stroke Mother   . Arthritis Father   . Asthma Father   . Diabetes Father   . Hypertension Father   . Hypertension Brother   . Diabetes Brother   . Diabetes Maternal Grandmother   . Hypertension Maternal Grandmother   . Diabetes Maternal Grandfather   . Hypertension Maternal Grandfather   . Diabetes Paternal Grandmother   . Hypertension Paternal Grandmother   . Heart attack Maternal Uncle    Social History   Socioeconomic History  . Marital status: Single    Spouse name: Not on file  . Number of children: Not on file  . Years of education: GED  . Highest education level: GED or equivalent  Occupational History  . Not on file  Social Needs  . Financial resource strain: Somewhat hard  . Food insecurity:    Worry: Never true    Inability: Never true  .  Transportation needs:    Medical: No    Non-medical: No  Tobacco Use  . Smoking status: Never Smoker  . Smokeless tobacco: Former Network engineer and Sexual Activity  . Alcohol use: Yes    Alcohol/week: 0.0 oz    Comment: 1-2 beers/month  . Drug use: No  . Sexual activity: Yes    Birth control/protection: None  Lifestyle  . Physical activity:    Days per week: 0 days    Minutes per session: 0 min  . Stress: Not at all  Relationships  . Social connections:    Talks on phone: More than three times a week    Gets together: More than three times a week    Attends religious service: 1 to 4 times per year    Active member of club or organization: No    Attends meetings of clubs or  organizations: Never    Relationship status: Never married  Other Topics Concern  . Not on file  Social History Narrative  . Not on file    Outpatient Encounter Medications as of 08/13/2017  Medication Sig  . atenolol (TENORMIN) 50 MG tablet Take 1 tablet (50 mg total) by mouth daily.  Marland Kitchen atorvastatin (LIPITOR) 10 MG tablet Take 1 tablet (10 mg total) by mouth daily.  . blood glucose meter kit and supplies KIT Dispense based on patient and insurance preference. Use up to four times daily as directed. (FOR ICD-9 250.00, 250.01).  . CONTOUR NEXT TEST test strip TEST BLOOD SUGAR UP TO  once daily  . Exenatide ER (BYDUREON BCISE) 2 MG/0.85ML AUIJ Inject 2 mg into the skin once a week.  . ferrous sulfate 325 (65 FE) MG tablet Take 1 tablet (325 mg total) by mouth daily with breakfast.  . furosemide (LASIX) 40 MG tablet Take 1 tablet (40 mg total) by mouth 2 (two) times daily.  Marland Kitchen glipiZIDE (GLUCOTROL) 10 MG tablet Take 1 tablet (10 mg total) by mouth 2 (two) times daily before a meal.  . Insulin Glargine (TOUJEO SOLOSTAR) 300 UNIT/ML SOPN Inject 50 Units into the skin at bedtime.  . Insulin Pen Needle 32G X 4 MM MISC 1 Units by Does not apply route every morning. Pen needles  . levonorgestrel (MIRENA) 20 MCG/24HR IUD 1 each by Intrauterine route once.  Marland Kitchen lisinopril (PRINIVIL,ZESTRIL) 40 MG tablet Take 1 tablet (40 mg total) by mouth daily.  . OXYGEN Inhale 2 L into the lungs daily.   No facility-administered encounter medications on file as of 08/13/2017.     Activities of Daily Living In your present state of health, do you have any difficulty performing the following activities: 08/13/2017  Hearing? N  Vision? N  Difficulty concentrating or making decisions? Y  Walking or climbing stairs? Y  Comment SOB   Dressing or bathing? N  Doing errands, shopping? N  Preparing Food and eating ? N  Using the Toilet? N  In the past six months, have you accidently leaked urine? N  Do you have  problems with loss of bowel control? N  Managing your Medications? N  Managing your Finances? N  Housekeeping or managing your Housekeeping? N  Some recent data might be hidden    Patient Care Team: Kathrine Haddock, NP as PCP - General (Nurse Practitioner) End, Harrell Gave, MD as PCP - Cardiology (Cardiology) Sharlotte Alamo, DPM (Podiatry) Defrancesco, Alanda Slim, MD as Consulting Physician (Obstetrics and Gynecology) Leandrew Koyanagi, MD as Referring Physician (Ophthalmology)    Assessment:  This is a routine wellness examination for Magdelyn.  Exercise Activities and Dietary recommendations Current Exercise Habits: The patient does not participate in regular exercise at present, Exercise limited by: None identified  Goals    None      Fall Risk Fall Risk  08/13/2017 08/08/2016 08/02/2015  Falls in the past year? No No No   Is the patient's home free of loose throw rugs in walkways, pet beds, electrical cords, etc?   yes      Grab bars in the bathroom? no      Handrails on the stairs?   no      Adequate lighting?   yes  Timed Get Up and Go performed: Completed in 9 seconds with no use of assistive devices, steady gait. No intervention needed at this time.   Depression Screen PHQ 2/9 Scores 08/13/2017 08/08/2016 08/02/2015 04/25/2015  PHQ - 2 Score 2 3 1  0  PHQ- 9 Score 5 7 - -     Cognitive Function     6CIT Screen 08/13/2017 08/08/2016  What Year? 0 points 0 points  What month? 0 points 0 points  What time? 0 points 0 points  Count back from 20 0 points 0 points  Months in reverse 0 points 0 points  Repeat phrase 0 points 0 points  Total Score 0 0    Immunization History  Administered Date(s) Administered  . Pneumococcal Polysaccharide-23 01/12/2007, 04/18/2009  . Tdap 07/07/2007    Qualifies for Shingles Vaccine?no  Screening Tests Health Maintenance  Topic Date Due  . URINE MICROALBUMIN  03/19/2015  . TETANUS/TDAP  07/06/2017  . PNEUMOCOCCAL POLYSACCHARIDE  VACCINE (2) 04/25/2034 (Originally 01/12/2012)  . FOOT EXAM  08/21/2017  . INFLUENZA VACCINE  09/04/2017  . HEMOGLOBIN A1C  09/14/2017  . OPHTHALMOLOGY EXAM  07/11/2018  . PAP SMEAR  01/21/2020  . HIV Screening  Addressed    Cancer Screenings: Lung: Low Dose CT Chest recommended if Age 55-80 years, 30 pack-year currently smoking OR have quit w/in 15years. Patient does not qualify. Breast:  Up to date on Mammogram? No  indicated Up to date of Bone Density/Dexa? No not indicated  Colorectal: not indicated  Additional Screenings:  Hepatitis C Screening: not indicated      Plan:    I have personally reviewed and addressed the Medicare Annual Wellness questionnaire and have noted the following in the patient's chart:  A. Medical and social history B. Use of alcohol, tobacco or illicit drugs  C. Current medications and supplements D. Functional ability and status E.  Nutritional status F.  Physical activity G. Advance directives H. List of other physicians I.  Hospitalizations, surgeries, and ER visits in previous 12 months J.  [place] such as hearing and vision if needed, cognitive and depression L. Referrals and appointments   In addition, I have reviewed and discussed with patient certain preventive protocols, quality metrics, and best practice recommendations. A written personalized care plan for preventive services as well as general preventive health recommendations were provided to patient.   Signed,  Tyler Aas, LPN Nurse Health Advisor   Nurse Notes:none

## 2017-08-13 NOTE — Patient Instructions (Addendum)
Sara Zhang , Thank you for taking time to come for yourMedicare Wellness Visit. I appreciate your ongoing commitment to your health goals. Please review the following plan we discussed and let me know if I can assist you in the future.   Screening recommendations/referrals: Colonoscopy: Due at age 47 Mammogram: Please call [phone] to schedule your mammogram. Bone Density: Due at age 60 Recommended yearly ophthalmology/optometry visit for glaucoma screening and checkup Recommended yearly dental visit for hygiene and checkup  Vaccinations: Influenza vaccine: Due 10/2017 Pneumococcal vaccine: Due at age 67 Tdap vaccine: due, check with your insurance company for coverage information.  Shingles vaccine: shingrix eligible at age 46  Advanced directives: Advance directive discussed with you today. I have provided a copy for you to complete at home and have notarized. Once this is complete please bring a copy in to our office so we can scan it into your chart.  Conditions/risks identified: Recommend drinking at least 4-5 glasses of water a day  Next appointment: Follow up on 08/21/2016 at 8:30am with Regino Schultze. Follow up in one year for your annual wellness exam.   Preventive Care 40-64 Years, Female Preventive care refers to lifestyle choices and visits with your health care provider that can promote health and wellness. What does preventive care include?     A yearly physical exam. This is also called an annual well check.  Dental exams once or twice a year.  Routine eye exams. Ask your health care provider how often you should have your eyes checked.  Personal lifestyle choices, including: ? Daily care of your teeth and gums. ? Regular physical activity. ? Eating a healthy diet. ? Avoiding tobacco and drug use. ? Limiting alcohol use. ? Practicing safe sex. ? Taking low-dose aspirin daily starting at age 60. ? Taking vitamin and mineral supplements as  recommended by your health care provider. What happens during an annual well check? The services and screenings done by your health care provider during your annual well check will depend on your age, overall health, lifestyle risk factors, and family history of disease. Counseling  Your health care provider may ask you questions about your:  Alcohol use.  Tobacco use.  Drug use.  Emotional well-being.  Home and relationship well-being.  Sexual activity.  Eating habits.  Work and work Statistician.  Method of birth control.  Menstrual cycle.  Pregnancy history. Screening  You may have the following tests or measurements:  Height, weight, and BMI.  Blood pressure.  Lipid and cholesterol levels. These may be checked every 5 years, or more frequently if you are over 79 years old.  Skin check.  Lung cancer screening. You may have this screening every year starting at age 75 if you have a 30-pack-year history of smoking and currently smoke or have quit within the past 15 years.  Fecal occult blood test (FOBT) of the stool. You may have this test every year starting at age 35.  Flexible sigmoidoscopy or colonoscopy. You may have a sigmoidoscopy every 5 years or a colonoscopy every 10 years starting at age 84.  Hepatitis C blood test.  Hepatitis B blood test.  Sexually transmitted disease (STD) testing.  Diabetes screening. This is done by checking your blood sugar (glucose) after you have not eaten for a while (fasting). You may have this done every 1-3 years.  Mammogram. This may be done every 1-2 years. Talk to your health care provider about when you should start having regular mammograms. This may  depend on whether you have a family history of breast cancer.  BRCA-related cancer screening. This may be done if you have a family history of breast, ovarian, tubal, or peritoneal cancers.  Pelvic exam and Pap test. This may be done every 3 years starting at age 20.  Starting at age 21, this may be done every 5 years if you have a Pap test in combination with an HPV test.  Bone density scan. This is done to screen for osteoporosis. You may have this scan if you are at high risk for osteoporosis. Discuss your test results, treatment options, and if necessary, the need for more tests with your health care provider. Vaccines   Your health care provider may recommend certain vaccines, such as:  Influenza vaccine. This is recommended every year.  Tetanus, diphtheria, and acellular pertussis (Tdap, Td) vaccine. You may need a Td booster every 10 years.  Zoster vaccine. You may need this after age 104.  Pneumococcal 13-valent conjugate (PCV13) vaccine. You may need this if you have certain conditions and were not previously vaccinated.  Pneumococcal polysaccharide (PPSV23) vaccine. You may need one or two doses if you smoke cigarettes or if you have certain conditions. Talk to your health care provider about which screenings and vaccines you need and how often you need them. This information is not intended to replace advice given to you by your health care provider. Make sure you discuss any questions you have with your health care provider. Document Released: 02/17/2015 Document Revised: 10/11/2015 Document Reviewed: 11/22/2014 Elsevier Interactive Patient Education  2017 [place] Prevention in the Home  Falls can cause injuries. They can happen to people of all ages. There are many things you can do to make your home safe and to help prevent falls. What can I do on the outside of my home?  Regularly fix the edges of walkways and driveways and fix any cracks.  Remove anything that might make you trip as you walk through a door, such as a raised step or threshold.  Trim any bushes or trees on the path to your home.  Use bright outdoor lighting.  Clear any walking paths of anything that might make someone trip, such as rocks or  tools.  Regularly check to see if handrails are loose or broken. Make sure that both sides of any steps have handrails.  Any raised decks and porches should have guardrails on the edges.  Have any leaves, snow, or ice cleared regularly.  Use sand or salt on walking paths during winter.  Clean up any spills in your garage right away. This includes oil or grease spills. What can I do in the bathroom?  Use night lights.  Install grab bars by the toilet and in the tub and shower. Do not use towel bars as grab bars.  Use non-skid mats or decals in the tub or shower.  If you need to sit down in the shower, use a plastic, non-slip stool.  Keep the floor dry. Clean up any water that spills on the floor as soon as it happens.  Remove soap buildup in the tub or shower regularly.  Attach bath mats securely with double-sided non-slip rug tape.  Do not have throw rugs and other things on the floor that can make you trip. What can I do in the bedroom?  Use night lights.  Make sure that you have a light by your bed that is easy to reach.  Do  not use any sheets or blankets that are too big for your bed. They should not hang down onto the floor.  Have a firm chair that has side arms. You can use this for support while you get dressed.  Do not have throw rugs and other things on the floor that can make you trip. What can I do in the kitchen?  Clean up any spills right away.  Avoid walking on wet floors.  Keep items that you use a lot in easy-to-reach places.  If you need to reach something above you, use a strong step stool that has a grab bar.  Keep electrical cords out of the way.  Do not use floor polish or wax that makes floors slippery. If you must use wax, use non-skid floor wax.  Do not have throw rugs and other things on the floor that can make you trip. What can I do with my stairs?  Do not leave any items on the stairs.  Make sure that there are handrails on both  sides of the stairs and use them. Fix handrails that are broken or loose. Make sure that handrails are as long as the stairways.  Check any carpeting to make sure that it is firmly attached to the stairs. Fix any carpet that is loose or worn.  Avoid having throw rugs at the top or bottom of the stairs. If you do have throw rugs, attach them to the floor with carpet tape.  Make sure that you have a light switch at the top of the stairs and the bottom of the stairs. If you do not have them, ask someone to add them for you. What else can I do to help prevent falls?  Wear shoes that: ? Do not have high heels. ? Have rubber bottoms. ? Are comfortable and fit you well. ? Are closed at the toe. Do not wear sandals.  If you use a stepladder: ? Make sure that it is fully opened. Do not climb a closed stepladder. ? Make sure that both sides of the stepladder are locked into place. ? Ask someone to hold it for you, if possible.  Clearly mark and make sure that you can see: ? Any grab bars or handrails. ? First and last steps. ? Where the edge of each step is.  Use tools that help you move around (mobility aids) if they are needed. These include: ? Canes. ? Walkers. ? Scooters. ? Crutches.  Turn on the lights when you go into a dark area. Replace any light bulbs as soon as they burn out.  Set up your furniture so you have a clear path. Avoid moving your furniture around.  If any of your floors are uneven, fix them.  If there are any pets around you, be aware of where they are.  Review your medicines with your doctor. Some medicines can make you feel dizzy. This can increase your chance of falling. Ask your doctor what other things that you can do to help prevent falls. This information is not intended to replace advice given to you by your health care provider. Make sure you discuss any questions you have with your health care provider. Document Released: 11/17/2008 Document Revised:  06/29/2015 Document Reviewed: 02/25/2014 Elsevier Interactive Patient Education  2017 [employer].

## 2017-08-20 ENCOUNTER — Encounter: Payer: Self-pay | Admitting: Unknown Physician Specialty

## 2017-08-20 ENCOUNTER — Ambulatory Visit (INDEPENDENT_AMBULATORY_CARE_PROVIDER_SITE_OTHER): Payer: Medicare Other | Admitting: Unknown Physician Specialty

## 2017-08-20 ENCOUNTER — Other Ambulatory Visit: Payer: Self-pay

## 2017-08-20 VITALS — BP 116/77 | HR 76 | Temp 98.2°F | Ht 68.0 in | Wt 378.5 lb

## 2017-08-20 DIAGNOSIS — D500 Iron deficiency anemia secondary to blood loss (chronic): Secondary | ICD-10-CM

## 2017-08-20 DIAGNOSIS — E1165 Type 2 diabetes mellitus with hyperglycemia: Secondary | ICD-10-CM

## 2017-08-20 DIAGNOSIS — E782 Mixed hyperlipidemia: Secondary | ICD-10-CM

## 2017-08-20 DIAGNOSIS — I5032 Chronic diastolic (congestive) heart failure: Secondary | ICD-10-CM

## 2017-08-20 DIAGNOSIS — I110 Hypertensive heart disease with heart failure: Secondary | ICD-10-CM

## 2017-08-20 DIAGNOSIS — I5022 Chronic systolic (congestive) heart failure: Secondary | ICD-10-CM

## 2017-08-20 DIAGNOSIS — N182 Chronic kidney disease, stage 2 (mild): Secondary | ICD-10-CM | POA: Diagnosis not present

## 2017-08-20 LAB — BAYER DCA HB A1C WAIVED: HB A1C: 7.3 % — AB (ref <7.0)

## 2017-08-20 MED ORDER — INSULIN GLARGINE 300 UNIT/ML ~~LOC~~ SOPN: 50.0000 [IU] | PEN_INJECTOR | Freq: Every day | SUBCUTANEOUS | 6 refills | Status: DC

## 2017-08-20 NOTE — Assessment & Plan Note (Signed)
Improved control with Hgb A1C at 7.3%.  Doing much better with last reading 9.1%.  Continue present and recheck in 3 months.  Discussed goal is below 7%

## 2017-08-20 NOTE — Assessment & Plan Note (Signed)
Check labs today.  Adjust statin accordingly

## 2017-08-20 NOTE — Assessment & Plan Note (Signed)
Stable, continue present medications.   

## 2017-08-20 NOTE — Progress Notes (Signed)
BP 116/77   Pulse 76   Temp 98.2 F (36.8 C) (Oral)   Ht 5\' 8"  (1.727 m)   Wt (!) 378 lb 8 oz (171.7 kg)   SpO2 91%   BMI 57.55 kg/m    Subjective:    Patient ID: Sara Zhang, female    DOB: 08-05-1970, 47 y.o.   MRN: [id number]  HPI: EDLA PARA is a 47 y.o. female  Chief Complaint  Patient presents with  . Follow-up  . Diabetes  . Hypertension   Last seen here 03/17/17 for poorly controlled BP and DM.  Lost to 3 month f/u  Diabetes: Using medications without difficulties.  Takes Toujeo 50 units daily No hypoglycemic episodes No hyperglycemic episodes Feet problems:none Blood Sugars averaging: Stated they are doing well but unable to give me specific numbers.   eye exam within last year Last Hgb A1C: 9.1  Hypertension  Using medications without difficulty Average home BPs   Using medication without problems or lightheadedness No chest pain with exertion or shortness of breath Mild edema today  Elevated Cholesterol Using medications without problems No Muscle aches  Diet: Exercise:  Unable to work on these  CHF Heart failure: Sees Dr Okey Dupre and Marvis Repress NP.  Last visit on 5/8 indicates doing well with improvement in swelling and SOB with increase of Furosemide and Lisinopril.  Instructions were to increase Atenolol for a BP of 150/80.  Today she reports continuing to feel well and is without her portable O2 with no complaints of SOB  Anemia Presumed due to menstrual bleeding.  Had an ablation and an IUD through Dr Greggory Keen at [facility]  Relevant past medical, surgical, family and social history reviewed and updated as indicated. Interim medical history since our last visit reviewed. Allergies and medications reviewed and updated.  Review of Systems  Per HPI unless specifically indicated above     Objective:    BP 116/77   Pulse 76   Temp 98.2 F (36.8 C) (Oral)   Ht 5\' 8"  (1.727 m)   Wt (!) 378 lb 8 oz (171.7 kg)   SpO2 91%   BMI 57.55  kg/m   Wt Readings from Last 3 Encounters:  08/20/17 (!) 378 lb 8 oz (171.7 kg)  08/13/17 (!) 379 lb 3.2 oz (172 kg)  07/10/17 (!) 380 lb 4 oz (172.5 kg)    Physical Exam  Constitutional: She is oriented to person, place, and time. She appears well-developed and well-nourished. No distress.  HENT:  Head: Normocephalic and atraumatic.  Eyes: Conjunctivae and lids are normal. Right eye exhibits no discharge. Left eye exhibits no discharge. No scleral icterus.  Neck: Normal range of motion. Neck supple. No JVD present. Carotid bruit is not present.  Cardiovascular: Normal rate, regular rhythm and normal heart sounds.  Pulmonary/Chest: Effort normal and breath sounds normal.  Abdominal: Normal appearance. There is no splenomegaly or hepatomegaly.  Musculoskeletal: Normal range of motion.  Neurological: She is alert and oriented to person, place, and time.  Skin: Skin is warm, dry and intact. No rash noted. No pallor.  Psychiatric: She has a normal mood and affect. Her behavior is normal. Judgment and thought content normal.    Results for orders placed or performed in visit on 07/11/17  HM DIABETES EYE EXAM  Result Value Ref Range   HM Diabetic Eye Exam No Retinopathy No Retinopathy      Assessment & Plan:   Problem List Items Addressed This Visit  Unprioritized   Chronic diastolic heart failure (HCC)    Improved.  Seeing cardiologist next month      CKD (chronic kidney disease), stage II   Hyperlipidemia    Check labs today.  Adjust statin accordingly      Hypertensive heart disease with congestive heart failure (HCC)    Stable, continue present medications.        Relevant Orders   Comprehensive metabolic panel   Lipid Panel w/o Chol/HDL Ratio   Iron deficiency anemia due to chronic blood loss - Primary    Check CBC today      Relevant Orders   CBC with Differential/Platelet   Poorly controlled type 2 diabetes mellitus (HCC)    Improved control with Hgb A1C  at 7.3%.  Doing much better with last reading 9.1%.  Continue present and recheck in 3 months.  Discussed goal is below 7%      Relevant Medications   Insulin Glargine (TOUJEO SOLOSTAR) 300 UNIT/ML SOPN   Other Relevant Orders   Bayer DCA Hb A1c Waived       Follow up plan: Return in about 3 months (around 11/20/2017).

## 2017-08-20 NOTE — Assessment & Plan Note (Signed)
Check CBC today.  

## 2017-08-20 NOTE — Assessment & Plan Note (Signed)
Improved.  Seeing cardiologist next month

## 2017-08-21 LAB — CBC WITH DIFFERENTIAL/PLATELET
BASOS ABS: 0 10*3/uL (ref 0.0–0.2)
BASOS: 0 %
EOS (ABSOLUTE): 0.4 10*3/uL (ref 0.0–0.4)
EOS: 5 %
HEMATOCRIT: 34.8 % (ref 34.0–46.6)
HEMOGLOBIN: 10.3 g/dL — AB (ref 11.1–15.9)
IMMATURE GRANS (ABS): 0 10*3/uL (ref 0.0–0.1)
Immature Granulocytes: 0 %
LYMPHS ABS: 1.9 10*3/uL (ref 0.7–3.1)
Lymphs: 21 %
MCH: 24.5 pg — AB (ref 26.6–33.0)
MCHC: 29.6 g/dL — ABNORMAL LOW (ref 31.5–35.7)
MCV: 83 fL (ref 79–97)
MONOCYTES: 8 %
Monocytes Absolute: 0.7 10*3/uL (ref 0.1–0.9)
NEUTROS ABS: 5.9 10*3/uL (ref 1.4–7.0)
Neutrophils: 66 %
Platelets: 382 10*3/uL (ref 150–450)
RBC: 4.21 x10E6/uL (ref 3.77–5.28)
RDW: 16 % — ABNORMAL HIGH (ref 12.3–15.4)
WBC: 8.9 10*3/uL (ref 3.4–10.8)

## 2017-08-21 LAB — LIPID PANEL W/O CHOL/HDL RATIO
CHOLESTEROL TOTAL: 100 mg/dL (ref 100–199)
HDL: 38 mg/dL — ABNORMAL LOW (ref >39)
LDL CALC: 52 mg/dL (ref 0–99)
Triglycerides: 51 mg/dL (ref 0–149)
VLDL CHOLESTEROL CAL: 10 mg/dL (ref 5–40)

## 2017-08-21 LAB — COMPREHENSIVE METABOLIC PANEL
A/G RATIO: 0.9 — AB (ref 1.2–2.2)
ALBUMIN: 3.3 g/dL — AB (ref 3.5–5.5)
ALK PHOS: 98 IU/L (ref 39–117)
ALT: 10 IU/L (ref 0–32)
AST: 11 IU/L (ref 0–40)
BILIRUBIN TOTAL: 0.2 mg/dL (ref 0.0–1.2)
BUN / CREAT RATIO: 15 (ref 9–23)
BUN: 15 mg/dL (ref 6–24)
CHLORIDE: 97 mmol/L (ref 96–106)
CO2: 27 mmol/L (ref 20–29)
Calcium: 8 mg/dL — ABNORMAL LOW (ref 8.7–10.2)
Creatinine, Ser: 0.98 mg/dL (ref 0.57–1.00)
GFR calc non Af Amer: 68 mL/min/{1.73_m2} (ref >59)
GFR, EST AFRICAN AMERICAN: 79 mL/min/{1.73_m2} (ref >59)
GLUCOSE: 154 mg/dL — AB (ref 65–99)
Globulin, Total: 3.7 g/dL (ref 1.5–4.5)
POTASSIUM: 4.8 mmol/L (ref 3.5–5.2)
SODIUM: 136 mmol/L (ref 134–144)
Total Protein: 7 g/dL (ref 6.0–8.5)

## 2017-09-16 NOTE — Progress Notes (Signed)
Follow-up Outpatient Visit Date: 09/17/2017  Primary Care Provider: Kathrine Haddock, NP [address] [place] [address]  Chief Complaint: Cough  HPI:  Sara Zhang is a 46 y.o. year-old female with history of heart failure (LVEF reportedly 25 to 35% in 2014 with subsequent normalization), HTN, HLD, and type II DM, who presents for follow-up of chronic heart failure.  I last saw her in May, at which time she was feeling well.  She reported chronic shortness of breath that was stable to slightly improved compared to our prior visit in February.  Due to suboptimal blood pressure control, we agreed to increase atenolol to 50 mg daily.  She followed up 1 month later for recheck, with blood pressure still being mildly elevated at 139/92.  I recommended that she continue her current medications and work on lifestyle modifications.  Today, Sara Zhang reports having a cold with intermittent productive cough and some "rattling" in her chest.  This is been present for about a week and is gradually improving.  She has been using over-the-counter cold medications including Alka-Seltzer for relief.  Before her cold began, she was feeling well.  She has not had any chest pain nor shortness of breath, palpitations, or lightheadedness.  She has occasional leg edema at the Ludie Hudon of the day, which began around the time of her cold.  She has not had any significant vaginal bleeding.  --------------------------------------------------------------------------------------------------  Cardiovascular History & Procedures: Cardiovascular Problems:  Chronic systolic heart failure with normalization of LVEF  Risk Factors:  Hypertension, hyperlipidemia, diabetes mellitus, and obesity  Cath/PCI:  None  CV Surgery:  None  EP Procedures and Devices:  None  Non-Invasive Evaluation(s):  TTE (06/10/16): Normal LV size and function with LVEF of 55-60%. Normal diastolic parameters. Mild MR. Normal RV size and  function. Normal pulmonary artery pressure.  Report of LVEF 25-35% with global hypokinesis by outside echo on her before 03/04/12.  Recent CV Pertinent Labs: Lab Results  Component Value Date   CHOL 100 08/20/2017   CHOL 137 08/02/2015   HDL 38 (L) 08/20/2017   LDLCALC 52 08/20/2017   TRIG 51 08/20/2017   TRIG 80 08/02/2015   BNP 51.0 12/16/2015   K 4.8 08/20/2017   K 4.2 09/25/2013   BUN 15 08/20/2017   BUN 12 09/25/2013   CREATININE 0.98 08/20/2017   CREATININE 0.75 09/25/2013    Past medical and surgical history were reviewed and updated in EPIC.  Current Meds  Medication Sig  . atorvastatin (LIPITOR) 10 MG tablet Take 1 tablet (10 mg total) by mouth daily.  . blood glucose meter kit and supplies KIT Dispense based on patient and insurance preference. Use up to four times daily as directed. (FOR ICD-9 250.00, 250.01).  . CONTOUR NEXT TEST test strip TEST BLOOD SUGAR UP TO  once daily  . Exenatide ER (BYDUREON BCISE) 2 MG/0.85ML AUIJ Inject 2 mg into the skin once a week.  . ferrous sulfate 325 (65 FE) MG tablet Take 1 tablet (325 mg total) by mouth daily with breakfast.  . glipiZIDE (GLUCOTROL) 10 MG tablet Take 1 tablet (10 mg total) by mouth 2 (two) times daily before a meal.  . Insulin Glargine (TOUJEO SOLOSTAR) 300 UNIT/ML SOPN Inject 50 Units into the skin at bedtime.  . Insulin Pen Needle 32G X 4 MM MISC 1 Units by Does not apply route every morning. Pen needles  . levonorgestrel (MIRENA) 20 MCG/24HR IUD 1 each by Intrauterine route once.  . OXYGEN Inhale  2 L into the lungs daily.  . [DISCONTINUED] Insulin Glargine (TOUJEO SOLOSTAR) 300 UNIT/ML SOPN     Allergies: Patient has no known allergies.  Social History   Tobacco Use  . Smoking status: Never Smoker  . Smokeless tobacco: Former Network engineer Use Topics  . Alcohol use: Yes    Alcohol/week: 0.0 standard drinks    Comment: 1-2 beers/month  . Drug use: No    Family History  Problem Relation Age of  Onset  . Diabetes Mother   . Hyperlipidemia Mother   . Hypertension Mother   . Stroke Mother   . Arthritis Father   . Asthma Father   . Diabetes Father   . Hypertension Father   . Hypertension Brother   . Diabetes Brother   . Diabetes Maternal Grandmother   . Hypertension Maternal Grandmother   . Diabetes Maternal Grandfather   . Hypertension Maternal Grandfather   . Diabetes Paternal Grandmother   . Hypertension Paternal Grandmother   . Heart attack Maternal Uncle     Review of Systems: A 12-system review of systems was performed and was negative except as noted in the HPI.  --------------------------------------------------------------------------------------------------  Physical Exam: BP (!) 168/94 (BP Location: Left Arm, Patient Position: Sitting, Cuff Size: Large)   Pulse 73   Ht _0  (1.727 m)   Wt (!) 383 lb 4 oz (173.8 kg)   BMI 58.27 kg/m   General: Overly obese. HEENT: No conjunctival pallor or scleral icterus. Moist mucous membranes.  OP clear. Neck: Supple without lymphadenopathy, thyromegaly, JVD, or HJR, though evaluation is limited by body habitus. Lungs: Normal work of breathing. Clear to auscultation bilaterally without wheezes or crackles. Heart: Regular rate and rhythm without murmurs, rubs, or gallops.  Unable to assess PMI due to body habitus. Abd: Bowel sounds present. Soft, NT/ND.  Unable to assess HSM due to body habitus. Ext: No lower extremity edema. Skin: Warm and dry without rash.  EKG: NSR with first-degree AV block (PR interval 210 ms).  Otherwise, no significant abnormalities.  Lab Results  Component Value Date   WBC 8.9 08/20/2017   HGB 10.3 (L) 08/20/2017   HCT 34.8 08/20/2017   MCV 83 08/20/2017   PLT 382 08/20/2017    Lab Results  Component Value Date   NA 136 08/20/2017   K 4.8 08/20/2017   CL 97 08/20/2017   CO2 27 08/20/2017   BUN 15 08/20/2017   CREATININE 0.98 08/20/2017   GLUCOSE 154 (H) 08/20/2017   ALT 10  08/20/2017    Lab Results  Component Value Date   CHOL 100 08/20/2017   HDL 38 (L) 08/20/2017   LDLCALC 52 08/20/2017   TRIG 51 08/20/2017    --------------------------------------------------------------------------------------------------  ASSESSMENT AND PLAN: HFpEF Patient appears grossly euvolemic with stable symptoms.  Weight is up a little bit, though likely due to dietary indiscretion.  I have encouraged avoidance of salt and increased activity, once her cold has resolved.  We will continue her current regimen of atenolol, lisinopril, and furosemide.  Hypertension Blood pressure quite elevated today, though it had been normal at 2 previous physician visits.  I suspect ongoing cold and use of cold medications may be contributing to this.  I advised her to avoid cold medicines other than Coricidin HBP, particularly anything that contains a stimulant such as phenylephrine or pseudoephedrine.  We will defer making medication changes at this time.  I asked Sara Zhang to check her blood pressure at home and to contact  us if it is consistently above 140/90.  Upper respiratory tract infection Symptoms gradually improving.  As above, I have recommended avoiding over-the-counter cold medications other than Coricidin HBP.  If symptoms persist, the patient should follow-up with her PCP for further evaluation.  Morbid obesity Weight loss encouraged through diet and exercise.  Follow-up: Return to clinic in 6 months.  Nelva Bush, MD 09/17/2017 10:02 AM

## 2017-09-17 ENCOUNTER — Ambulatory Visit (INDEPENDENT_AMBULATORY_CARE_PROVIDER_SITE_OTHER): Payer: Medicare Other | Admitting: Internal Medicine

## 2017-09-17 ENCOUNTER — Encounter: Payer: Self-pay | Admitting: Internal Medicine

## 2017-09-17 VITALS — BP 168/94 | HR 73 | Ht 68.0 in | Wt 383.2 lb

## 2017-09-17 DIAGNOSIS — J069 Acute upper respiratory infection, unspecified: Secondary | ICD-10-CM

## 2017-09-17 DIAGNOSIS — I5032 Chronic diastolic (congestive) heart failure: Secondary | ICD-10-CM

## 2017-09-17 DIAGNOSIS — I10 Essential (primary) hypertension: Secondary | ICD-10-CM

## 2017-09-17 NOTE — Patient Instructions (Signed)
Medication Instructions:  Your physician recommends that you continue on your current medications as directed. Please refer to the Current Medication list given to you today.  STOP TAKING THE COLD MEDICATIONS YOU HAVE BEEN TAKING.  You may take CORICIDIN HBP over-the-counter medication to help alleviate your cold symptoms if needed.   Please monitor your Blood pressure and heart rate at home. If it consistently runs greater than 140/90, then please contact your primary care physician.  Labwork: none  Testing/Procedures: none  Follow-Up: Your physician recommends that you schedule a follow-up appointment in: 6 MONTHS WITH DR END.   If you need a refill on your cardiac medications before your next appointment, please call your pharmacy.

## 2017-10-15 ENCOUNTER — Other Ambulatory Visit: Payer: Self-pay | Admitting: Unknown Physician Specialty

## 2017-11-18 ENCOUNTER — Encounter: Payer: Self-pay | Admitting: Nurse Practitioner

## 2017-11-18 ENCOUNTER — Ambulatory Visit (INDEPENDENT_AMBULATORY_CARE_PROVIDER_SITE_OTHER): Payer: Medicare Other | Admitting: Nurse Practitioner

## 2017-11-18 DIAGNOSIS — E1165 Type 2 diabetes mellitus with hyperglycemia: Secondary | ICD-10-CM | POA: Diagnosis not present

## 2017-11-18 DIAGNOSIS — I10 Essential (primary) hypertension: Secondary | ICD-10-CM

## 2017-11-18 DIAGNOSIS — Z6841 Body Mass Index (BMI) 40.0 and over, adult: Secondary | ICD-10-CM | POA: Insufficient documentation

## 2017-11-18 DIAGNOSIS — E782 Mixed hyperlipidemia: Secondary | ICD-10-CM | POA: Diagnosis not present

## 2017-11-18 LAB — BAYER DCA HB A1C WAIVED: HB A1C (BAYER DCA - WAIVED): 8.2 % — ABNORMAL HIGH (ref <7.0)

## 2017-11-18 LAB — MICROALBUMIN, URINE WAIVED
CREATININE, URINE WAIVED: 200 mg/dL (ref 10–300)
MICROALB, UR WAIVED: 150 mg/L — AB (ref 0–19)

## 2017-11-18 MED ORDER — INSULIN GLARGINE 300 UNIT/ML ~~LOC~~ SOPN: 54.0000 [IU] | PEN_INJECTOR | Freq: Every day | SUBCUTANEOUS | 6 refills | Status: DC

## 2017-11-18 MED ORDER — BLOOD GLUCOSE MONITOR KIT: PACK | 0 refills | Status: DC

## 2017-11-18 NOTE — Assessment & Plan Note (Signed)
Chronic, ongoing.  Continue current statin dose.  Recheck lipid panel in January 2020.

## 2017-11-18 NOTE — Progress Notes (Signed)
BP 129/86 (BP Location: Left Arm, Patient Position: Sitting, Cuff Size: Large)   Pulse 86   Temp 99.1 F (37.3 C)   Wt (!) 378 lb 8 oz (171.7 kg)   SpO2 93%   BMI 57.55 kg/m    Subjective:    Patient ID: Sara Zhang, female    DOB: [birth year], 47 y.o.   MRN: [id number]  HPI: Sara Zhang is a 47 y.o. female presents for f/u BP and T2DM  Chief Complaint  Patient presents with  . Diabetes  . Hypertension  . Hyperlipidemia   DIABETES Hypoglycemic episodes:no Polydipsia/polyuria: no Visual disturbance: no Chest pain: no Paresthesias: no Glucose Monitoring: yes  Accucheck frequency: Daily  Fasting glucose:  Post prandial:  Evening:  Before meals: Taking Insulin?: yes  Long acting insulin: Glargine at home  Short acting insulin: Blood Pressure Monitoring: rarely Retinal Examination: Up to Date Foot Exam: Up to Date Diabetic Education: Completed Pneumovax: Up to Date Influenza: Not up to Date, discussed flu shot with her and she wishes to think about it. Aspirin: no  Does blood sugars daily around noon, but at times reports it is in 50 range.  However, she reports she has not ate at that time and feels that is why she is sometimes low.  Endorses not eating breakfast on most days.  Encouraged her to eat frequent small, healthy meals throughout daytime hours which will assist with FSBS and weight loss.  HYPERTENSION / HYPERLIPIDEMIA/CHF Last seen by cardiology 09/17/17 Satisfied with current treatment? yes Duration of hypertension: chronic BP monitoring frequency: rarely BP range:  BP medication side effects: no Past BP meds: does not recall Duration of hyperlipidemia: chronic Cholesterol medication side effects: no Cholesterol supplements: none Past cholesterol medications: does not recall Medication compliance: excellent compliance Aspirin: no Recent stressors: no Recurrent headaches: no Visual changes: no Palpitations: no Dyspnea: no Chest pain:  no Lower extremity edema: no Dizzy/lightheaded: no  Checks BP on occasion at drug store and it is 120-130/80-90.  Does notice when she has headaches and checks it, it does run "a little higher".  Often runs higher when she is sick with a "cold".  She reports she "gets sick easily with sinus stuff".  She reports she is not taking cold medicine with pseudoephedrine or phenylephrine at this time.  She reports not having taken these since her last appointment with cardiology.  She is taking Coricidin HBP for sinus issues when needed. Reports she recently went on trip to the beach and forgot her O2, states she was able to walk  "longer distances" and had no episodes of SOB.  She reports some increased SOB upon return from trip, but none over past week.  Denies headaches, CP, SOB, or palpitations on exam today.  States that her edema in legs is at baseline.  States that she fluctuates between taking Lasix once a day or twice a day, encouraged her to take this as ordered on a daily basis.  Morbid Obesity: Endorses not following healthy diet regimen.  Eating lots of chocolate and sweets over past two months.  Relevant past medical, surgical, family and social history reviewed and updated as indicated. Interim medical history since our last visit reviewed. Allergies and medications reviewed and updated.  Review of Systems  Constitutional: Negative for activity change, appetite change and fatigue.  Respiratory: Negative for cough, chest tightness and shortness of breath.   Cardiovascular: Positive for leg swelling. Negative for chest pain and palpitations.  Reports the edema in her leg is at baseline.  Gastrointestinal: Negative for abdominal distention and abdominal pain.  Endocrine: Negative for cold intolerance, heat intolerance, polydipsia, polyphagia and polyuria.  Neurological: Negative for dizziness, numbness and headaches.    Per HPI unless specifically indicated above     Objective:     BP 129/86 (BP Location: Left Arm, Patient Position: Sitting, Cuff Size: Large)   Pulse 86   Temp 99.1 F (37.3 C)   Wt (!) 378 lb 8 oz (171.7 kg)   SpO2 93%   BMI 57.55 kg/m   Wt Readings from Last 3 Encounters:  11/18/17 (!) 378 lb 8 oz (171.7 kg)  09/17/17 (!) 383 lb 4 oz (173.8 kg)  08/20/17 (!) 378 lb 8 oz (171.7 kg)    Physical Exam  Constitutional: She is oriented to person, place, and time. She appears well-developed and well-nourished.  HENT:  Head: Normocephalic and atraumatic.  Eyes: Pupils are equal, round, and reactive to light.  Neck: Normal range of motion. Neck supple.  Cardiovascular: Normal rate, regular rhythm, S1 normal, S2 normal and normal heart sounds.  Pulmonary/Chest: Effort normal and breath sounds normal. No accessory muscle usage.  Abdominal: Soft. Bowel sounds are normal.  Neurological: She is alert and oriented to person, place, and time.  Skin: Skin is warm and dry.  Psychiatric: She has a normal mood and affect. Her behavior is normal. Judgment and thought content normal.    Results for orders placed or performed in visit on 08/20/17  Bayer DCA Hb A1c Waived  Result Value Ref Range   HB A1C (BAYER DCA - WAIVED) 7.3 (H) <7.0 %  Comprehensive metabolic panel  Result Value Ref Range   Glucose 154 (H) 65 - 99 mg/dL   BUN 15 6 - 24 mg/dL   Creatinine, Ser 7.82 0.57 - 1.00 mg/dL   GFR calc non Af Amer 68 >59 mL/min/1.73   GFR calc Af Amer 79 >59 mL/min/1.73   BUN/Creatinine Ratio 15 9 - 23   Sodium 136 134 - 144 mmol/L   Potassium 4.8 3.5 - 5.2 mmol/L   Chloride 97 96 - 106 mmol/L   CO2 27 20 - 29 mmol/L   Calcium 8.0 (L) 8.7 - 10.2 mg/dL   Total Protein 7.0 6.0 - 8.5 g/dL   Albumin 3.3 (L) 3.5 - 5.5 g/dL   Globulin, Total 3.7 1.5 - 4.5 g/dL   Albumin/Globulin Ratio 0.9 (L) 1.2 - 2.2   Bilirubin Total 0.2 0.0 - 1.2 mg/dL   Alkaline Phosphatase 98 39 - 117 IU/L   AST 11 0 - 40 IU/L   ALT 10 0 - 32 IU/L  CBC with Differential/Platelet   Result Value Ref Range   WBC 8.9 3.4 - 10.8 x10E3/uL   RBC 4.21 3.77 - 5.28 x10E6/uL   Hemoglobin 10.3 (L) 11.1 - 15.9 g/dL   Hematocrit 95.6 21.3 - 46.6 %   MCV 83 79 - 97 fL   MCH 24.5 (L) 26.6 - 33.0 pg   MCHC 29.6 (L) 31.5 - 35.7 g/dL   RDW 08.6 (H) 57.8 - 46.9 %   Platelets 382 150 - 450 x10E3/uL   Neutrophils 66 Not Estab. %   Lymphs 21 Not Estab. %   Monocytes 8 Not Estab. %   Eos 5 Not Estab. %   Basos 0 Not Estab. %   Neutrophils Absolute 5.9 1.4 - 7.0 x10E3/uL   Lymphocytes Absolute 1.9 0.7 - 3.1 x10E3/uL   Monocytes Absolute  0.7 0.1 - 0.9 x10E3/uL   EOS (ABSOLUTE) 0.4 0.0 - 0.4 x10E3/uL   Basophils Absolute 0.0 0.0 - 0.2 x10E3/uL   Immature Granulocytes 0 Not Estab. %   Immature Grans (Abs) 0.0 0.0 - 0.1 x10E3/uL  Lipid Panel w/o Chol/HDL Ratio  Result Value Ref Range   Cholesterol, Total 100 100 - 199 mg/dL   Triglycerides 51 0 - 149 mg/dL   HDL 38 (L) >20 mg/dL   VLDL Cholesterol Cal 10 5 - 40 mg/dL   LDL Calculated 52 0 - 99 mg/dL      Assessment & Plan:   Problem List Items Addressed This Visit      Cardiovascular and Mediastinum   Essential hypertension    Chronic, ongoing.  Initial BP today 186/99 (pt reports this was with small cuff), repeat by provider with large cuff 129/86.  Pt reports BP range 120-130/80-90 at home.  Has f/u with cardiology in November.  Continue current medication regimen.  Continue to collaborate with cardiology.  Continue to encourage weight loss.        Endocrine   Poorly controlled type 2 diabetes mellitus (HCC)    Chronic, ongoing, with report of poor diet over past two months.  A1C today 8.2.  Increased from previous of 7.3%.  Will increase Glargine at night to 54units and pt is going to focus on following a diabetic diet, cutting down on [facility].  Will recheck in 3 months.      Relevant Medications   Insulin Glargine (TOUJEO SOLOSTAR) 300 UNIT/ML SOPN   Other Relevant Orders   Bayer DCA Hb A1c Waived (STAT)    Microalbumin, Urine Waived (STAT)     Other   Hyperlipidemia    Chronic, ongoing.  Continue current statin dose.  Recheck lipid panel in January 2020.      Morbid obesity (HCC)    Chronic, ongoing with slight loss since last weight.  Encourage weight loss with small, frequent healthy/diabetic meals throughout day.  No skipping breakfast.      Relevant Medications   Insulin Glargine (TOUJEO SOLOSTAR) 300 UNIT/ML SOPN   BMI 50.0-59.9, adult (HCC)    Chronic, ongoing.  BMI 57.55 today.  Continue to encourage weight loss.      Relevant Medications   Insulin Glargine (TOUJEO SOLOSTAR) 300 UNIT/ML SOPN       Follow up plan: Return in about 3 months (around 02/18/2018), or if symptoms worsen or fail to improve, for T2DM and HTN/HLD.

## 2017-11-18 NOTE — Assessment & Plan Note (Signed)
Chronic, ongoing.  BMI 57.55 today.  Continue to encourage weight loss.

## 2017-11-18 NOTE — Patient Instructions (Addendum)
Glargine increase to 54 units at night.  Notify me if any blood sugars below 70 on frequent basis.  Diabetes Mellitus and Nutrition When you have diabetes (diabetes mellitus), it is very important to have healthy eating habits because your blood sugar (glucose) levels are greatly affected by what you eat and drink. Eating healthy foods in the appropriate amounts, at about the same times every day, can help you:  Control your blood glucose.  Lower your risk of heart disease.  Improve your blood pressure.  Reach or maintain a healthy weight.  Every person with diabetes is different, and each person has different needs for a meal plan. Your health care provider may recommend that you work with a diet and nutrition specialist (dietitian) to make a meal plan that is best for you. Your meal plan may vary depending on factors such as:  The calories you need.  The medicines you take.  Your weight.  Your blood glucose, blood pressure, and cholesterol levels.  Your activity level.  Other health conditions you have, such as heart or kidney disease.  How do carbohydrates affect me? Carbohydrates affect your blood glucose level more than any other type of food. Eating carbohydrates naturally increases the amount of glucose in your blood. Carbohydrate counting is a method for keeping track of how many carbohydrates you eat. Counting carbohydrates is important to keep your blood glucose at a healthy level, especially if you use insulin or take certain oral diabetes medicines. It is important to know how many carbohydrates you can safely have in each meal. This is different for every person. Your dietitian can help you calculate how many carbohydrates you should have at each meal and for snack. Foods that contain carbohydrates include:  Bread, cereal, rice, pasta, and crackers.  Potatoes and corn.  Peas, beans, and lentils.  Milk and yogurt.  Fruit and juice.  Desserts, such as cakes,  cookies, ice cream, and candy.  How does alcohol affect me? Alcohol can cause a sudden decrease in blood glucose (hypoglycemia), especially if you use insulin or take certain oral diabetes medicines. Hypoglycemia can be a life-threatening condition. Symptoms of hypoglycemia (sleepiness, dizziness, and confusion) are similar to symptoms of having too much alcohol. If your health care provider says that alcohol is safe for you, follow these guidelines:  Limit alcohol intake to no more than 1 drink per day for nonpregnant women and 2 drinks per day for men. One drink equals 12 oz of beer, 5 oz of wine, or 1 oz of hard liquor.  Do not drink on an empty stomach.  Keep yourself hydrated with water, diet soda, or unsweetened iced tea.  Keep in mind that regular soda, juice, and other mixers may contain a lot of sugar and must be counted as carbohydrates.  What are tips for following this plan? Reading food labels  Start by checking the serving size on the label. The amount of calories, carbohydrates, fats, and other nutrients listed on the label are based on one serving of the food. Many foods contain more than one serving per package.  Check the total grams (g) of carbohydrates in one serving. You can calculate the number of servings of carbohydrates in one serving by dividing the total carbohydrates by 15. For example, if a food has 30 g of total carbohydrates, it would be equal to 2 servings of carbohydrates.  Check the number of grams (g) of saturated and trans fats in one serving. Choose foods that have low  or no amount of these fats.  Check the number of milligrams (mg) of sodium in one serving. Most people should limit total sodium intake to less than 2,300 mg per day.  Always check the nutrition information of foods labeled as "low-fat" or "nonfat". These foods may be higher in added sugar or refined carbohydrates and should be avoided.  Talk to your dietitian to identify your daily  goals for nutrients listed on the label. Shopping  Avoid buying canned, premade, or processed foods. These foods tend to be high in fat, sodium, and added sugar.  Shop around the outside edge of the grocery store. This includes fresh fruits and vegetables, bulk grains, fresh meats, and fresh dairy. Cooking  Use low-heat cooking methods, such as baking, instead of high-heat cooking methods like deep frying.  Cook using healthy oils, such as olive, canola, or sunflower oil.  Avoid cooking with butter, cream, or high-fat meats. Meal planning  Eat meals and snacks regularly, preferably at the same times every day. Avoid going long periods of time without eating.  Eat foods high in fiber, such as fresh fruits, vegetables, beans, and whole grains. Talk to your dietitian about how many servings of carbohydrates you can eat at each meal.  Eat 4-6 ounces of lean protein each day, such as lean meat, chicken, fish, eggs, or tofu. 1 ounce is equal to 1 ounce of meat, chicken, or fish, 1 egg, or 1/4 cup of tofu.  Eat some foods each day that contain healthy fats, such as avocado, nuts, seeds, and fish. Lifestyle   Check your blood glucose regularly.  Exercise at least 30 minutes 5 or more days each week, or as told by your health care provider.  Take medicines as told by your health care provider.  Do not use any products that contain nicotine or tobacco, such as cigarettes and e-cigarettes. If you need help quitting, ask your health care provider.  Work with a Social worker or diabetes educator to identify strategies to manage stress and any emotional and social challenges. What are some questions to ask my health care provider?  Do I need to meet with a diabetes educator?  Do I need to meet with a dietitian?  What number can I call if I have questions?  When are the best times to check my blood glucose? Where to find more information:  American Diabetes Association:  diabetes.org/food-and-fitness/food  Academy of Nutrition and Dietetics: PokerClues.dk  [employer] of Diabetes and Digestive and Kidney Diseases (NIH): ContactWire.be Summary  A healthy meal plan will help you control your blood glucose and maintain a healthy lifestyle.  Working with a diet and nutrition specialist (dietitian) can help you make a meal plan that is best for you.  Keep in mind that carbohydrates and alcohol have immediate effects on your blood glucose levels. It is important to count carbohydrates and to use alcohol carefully. This information is not intended to replace advice given to you by your health care provider. Make sure you discuss any questions you have with your health care provider. Document Released: 10/18/2004 Document Revised: 02/26/2016 Document Reviewed: 02/26/2016 Elsevier Interactive Patient Education  [employer].

## 2017-11-18 NOTE — Assessment & Plan Note (Addendum)
Chronic, ongoing, with report of poor diet over past two months.  A1C today 8.2.  Increased from previous of 7.3%.  Will increase Glargine at night to 54units and pt is going to focus on following a diabetic diet, cutting down on [facility].  Will recheck in 3 months.

## 2017-11-18 NOTE — Assessment & Plan Note (Signed)
Chronic, ongoing with slight loss since last weight.  Encourage weight loss with small, frequent healthy/diabetic meals throughout day.  No skipping breakfast.

## 2017-11-18 NOTE — Assessment & Plan Note (Signed)
Chronic, ongoing.  Initial BP today 186/99 (pt reports this was with small cuff), repeat by provider with large cuff 129/86.  Pt reports BP range 120-130/80-90 at home.  Has f/u with cardiology in November.  Continue current medication regimen.  Continue to collaborate with cardiology.  Continue to encourage weight loss.

## 2017-12-08 ENCOUNTER — Other Ambulatory Visit: Payer: Self-pay | Admitting: Unknown Physician Specialty

## 2017-12-09 ENCOUNTER — Other Ambulatory Visit: Payer: Self-pay | Admitting: *Deleted

## 2017-12-09 MED ORDER — ATORVASTATIN CALCIUM 10 MG PO TABS: 10.0000 mg | ORAL_TABLET | Freq: Every day | ORAL | 1 refills | Status: DC

## 2017-12-09 NOTE — Progress Notes (Signed)
Requested Prescriptions  Pending Prescriptions Disp Refills  . atorvastatin (LIPITOR) 10 MG tablet 90 tablet 1    Sig: Take 1 tablet (10 mg total) by mouth daily.     There is no refill protocol information for this order     

## 2018-01-09 ENCOUNTER — Telehealth: Payer: Self-pay

## 2018-01-09 ENCOUNTER — Other Ambulatory Visit: Payer: Self-pay | Admitting: Nurse Practitioner

## 2018-01-09 MED ORDER — GLUCOSE BLOOD VI STRP: ORAL_STRIP | 2 refills | Status: AC

## 2018-01-09 NOTE — Telephone Encounter (Signed)
Awesome.  Done.:)

## 2018-01-09 NOTE — Telephone Encounter (Signed)
Refill request

## 2018-01-09 NOTE — Telephone Encounter (Signed)
Contour next test strips Please make sure to put how often they test or it will be denied. Sorry about that

## 2018-01-09 NOTE — Telephone Encounter (Signed)
For which medication?  It is not showing.

## 2018-02-19 ENCOUNTER — Encounter: Payer: Self-pay | Admitting: Nurse Practitioner

## 2018-02-20 ENCOUNTER — Ambulatory Visit: Payer: Medicare Other | Admitting: Nurse Practitioner

## 2018-02-24 ENCOUNTER — Encounter: Payer: Self-pay | Admitting: Nurse Practitioner

## 2018-02-24 ENCOUNTER — Ambulatory Visit (INDEPENDENT_AMBULATORY_CARE_PROVIDER_SITE_OTHER): Payer: Medicare Other | Admitting: Nurse Practitioner

## 2018-02-24 VITALS — BP 156/93 | HR 75 | Temp 98.9°F | Ht 68.0 in | Wt 386.4 lb

## 2018-02-24 DIAGNOSIS — I130 Hypertensive heart and chronic kidney disease with heart failure and stage 1 through stage 4 chronic kidney disease, or unspecified chronic kidney disease: Secondary | ICD-10-CM | POA: Diagnosis not present

## 2018-02-24 DIAGNOSIS — R7989 Other specified abnormal findings of blood chemistry: Secondary | ICD-10-CM

## 2018-02-24 DIAGNOSIS — Z6841 Body Mass Index (BMI) 40.0 and over, adult: Secondary | ICD-10-CM | POA: Diagnosis not present

## 2018-02-24 DIAGNOSIS — E1169 Type 2 diabetes mellitus with other specified complication: Secondary | ICD-10-CM

## 2018-02-24 DIAGNOSIS — E119 Type 2 diabetes mellitus without complications: Secondary | ICD-10-CM

## 2018-02-24 DIAGNOSIS — E782 Mixed hyperlipidemia: Secondary | ICD-10-CM | POA: Diagnosis not present

## 2018-02-24 DIAGNOSIS — N182 Chronic kidney disease, stage 2 (mild): Secondary | ICD-10-CM | POA: Diagnosis not present

## 2018-02-24 DIAGNOSIS — IMO0001 Reserved for inherently not codable concepts without codable children: Secondary | ICD-10-CM

## 2018-02-24 DIAGNOSIS — Z794 Long term (current) use of insulin: Secondary | ICD-10-CM | POA: Diagnosis not present

## 2018-02-24 DIAGNOSIS — E785 Hyperlipidemia, unspecified: Secondary | ICD-10-CM | POA: Diagnosis not present

## 2018-02-24 LAB — MICROALBUMIN, URINE WAIVED
Creatinine, Urine Waived: 200 mg/dL (ref 10–300)
Microalb, Ur Waived: 150 mg/L — ABNORMAL HIGH (ref 0–19)

## 2018-02-24 LAB — LIPID PANEL PICCOLO, WAIVED
CHOLESTEROL PICCOLO, WAIVED: 112 mg/dL (ref <200)
Chol/HDL Ratio Piccolo,Waive: 3.2 mg/dL
HDL CHOL PICCOLO, WAIVED: 35 mg/dL — AB (ref >59)
LDL Chol Calc Piccolo Waived: 65 mg/dL (ref <100)
TRIGLYCERIDES PICCOLO,WAIVED: 56 mg/dL (ref <150)
VLDL Chol Calc Piccolo,Waive: 11 mg/dL (ref <30)

## 2018-02-24 LAB — BAYER DCA HB A1C WAIVED: HB A1C (BAYER DCA - WAIVED): 7.9 % — ABNORMAL HIGH (ref <7.0)

## 2018-02-24 NOTE — Assessment & Plan Note (Signed)
Recheck TSH today.  

## 2018-02-24 NOTE — Assessment & Plan Note (Signed)
Chronic, ongoing.  A1C today 7.9%.  Will maintain current regimen and have recommended cutting back on [facility] + heavy focus on diet with weight loss.  She endorses being noncompliant.  Referral to endocrinology.

## 2018-02-24 NOTE — Patient Instructions (Signed)
Carbohydrate Counting for Diabetes Mellitus, Adult  Carbohydrate counting is a method of keeping track of how many carbohydrates you eat. Eating carbohydrates naturally increases the amount of sugar (glucose) in the blood. Counting how many carbohydrates you eat helps keep your blood glucose within normal limits, which helps you manage your diabetes (diabetes mellitus). It is important to know how many carbohydrates you can safely have in each meal. This is different for every person. A diet and nutrition specialist (registered dietitian) can help you make a meal plan and calculate how many carbohydrates you should have at each meal and snack. Carbohydrates are found in the following foods:  Grains, such as breads and cereals.  Dried beans and soy products.  Starchy vegetables, such as potatoes, peas, and corn.  Fruit and fruit juices.  Milk and yogurt.  Sweets and snack foods, such as cake, cookies, candy, chips, and soft drinks. How do I count carbohydrates? There are two ways to count carbohydrates in food. You can use either of the methods or a combination of both. Reading "Nutrition Facts" on packaged food The "Nutrition Facts" list is included on the labels of almost all packaged foods and beverages in the [place] It includes:  The serving size.  Information about nutrients in each serving, including the grams (g) of carbohydrate per serving. To use the "Nutrition Facts":  Decide how many servings you will have.  Multiply the number of servings by the number of carbohydrates per serving.  The resulting number is the total amount of carbohydrates that you will be having. Learning standard serving sizes of other foods When you eat carbohydrate foods that are not packaged or do not include "Nutrition Facts" on the label, you need to measure the servings in order to count the amount of carbohydrates:  Measure the foods that you will eat with a food scale or measuring cup, if needed.   Decide how many standard-size servings you will eat.  Multiply the number of servings by 15. Most carbohydrate-rich foods have about 15 g of carbohydrates per serving. ? For example, if you eat 8 oz (170 g) of strawberries, you will have eaten 2 servings and 30 g of carbohydrates (2 servings x 15 g = 30 g).  For foods that have more than one food mixed, such as soups and casseroles, you must count the carbohydrates in each food that is included. The following list contains standard serving sizes of common carbohydrate-rich foods. Each of these servings has about 15 g of carbohydrates:   hamburger bun or  English muffin.   oz (15 mL) syrup.   oz (14 g) jelly.  1 slice of bread.  1 six-inch tortilla.  3 oz (85 g) cooked rice or pasta.  4 oz (113 g) cooked dried beans.  4 oz (113 g) starchy vegetable, such as peas, corn, or potatoes.  4 oz (113 g) hot cereal.  4 oz (113 g) mashed potatoes or  of a large baked potato.  4 oz (113 g) canned or frozen fruit.  4 oz (120 mL) fruit juice.  4-6 crackers.  6 chicken nuggets.  6 oz (170 g) unsweetened dry cereal.  6 oz (170 g) plain fat-free yogurt or yogurt sweetened with artificial sweeteners.  8 oz (240 mL) milk.  8 oz (170 g) fresh fruit or one small piece of fruit.  24 oz (680 g) popped popcorn. Example of carbohydrate counting Sample meal  3 oz (85 g) chicken breast.  6 oz (170 g)   brown rice.  4 oz (113 g) corn.  8 oz (240 mL) milk.  8 oz (170 g) strawberries with sugar-free whipped topping. Carbohydrate calculation 1. Identify the foods that contain carbohydrates: ? Rice. ? Corn. ? Milk. ? Strawberries. 2. Calculate how many servings you have of each food: ? 2 servings rice. ? 1 serving corn. ? 1 serving milk. ? 1 serving strawberries. 3. Multiply each number of servings by 15 g: ? 2 servings rice x 15 g = 30 g. ? 1 serving corn x 15 g = 15 g. ? 1 serving milk x 15 g = 15 g. ? 1 serving  strawberries x 15 g = 15 g. 4. Add together all of the amounts to find the total grams of carbohydrates eaten: ? 30 g + 15 g + 15 g + 15 g = 75 g of carbohydrates total. Summary  Carbohydrate counting is a method of keeping track of how many carbohydrates you eat.  Eating carbohydrates naturally increases the amount of sugar (glucose) in the blood.  Counting how many carbohydrates you eat helps keep your blood glucose within normal limits, which helps you manage your diabetes.  A diet and nutrition specialist (registered dietitian) can help you make a meal plan and calculate how many carbohydrates you should have at each meal and snack. This information is not intended to replace advice given to you by your health care provider. Make sure you discuss any questions you have with your health care provider. Document Released: 01/21/2005 Document Revised: 07/31/2016 Document Reviewed: 07/05/2015 Elsevier Interactive Patient Education  2019 Elsevier Inc.  

## 2018-02-24 NOTE — Assessment & Plan Note (Signed)
Chronic, recommend increased focus on diet and exercise.

## 2018-02-24 NOTE — Assessment & Plan Note (Signed)
Chronic, ongoing.  Initial BP elevated with improvement with repeat, but still above goal.  Has not been taking daily Lasix due to bathroom issues.  Followed by cardiology with upcoming appointment.  Will defer changes to them at this time.

## 2018-02-24 NOTE — Assessment & Plan Note (Signed)
Chronic, ongoing.  LDL 65 and TCHOL 112.  Continue current regimen.

## 2018-02-24 NOTE — Progress Notes (Addendum)
BP (!) 156/93 (BP Location: Right Arm, Patient Position: Sitting)   Pulse 75   Temp 98.9 F (37.2 C)   Ht 5\' 8"  (1.727 m)   Wt (!) 386 lb 6 oz (175.3 kg)   SpO2 95%   BMI 58.75 kg/m    Subjective:    Patient ID: Sara Zhang, female    DOB: 04/11/1970, 48 y.o.   MRN: [id number]  HPI: Sara Zhang is a 48 y.o. female  Chief Complaint  Patient presents with  . Diabetes  . Hypertension  . Hyperlipidemia   DIABETES She reports "a lot of my diabetes issues is me".  Endorses drinking two 20 oz [employer] a day.  Also eats a "big snicker bar" with [facility].   Hypoglycemic episodes:no Polydipsia/polyuria: no Visual disturbance: no Chest pain: no Paresthesias: no Glucose Monitoring: yes  Accucheck frequency: BID  Fasting glucose: 170's  Post prandial:  Evening: 200's  Before meals: Taking Insulin?: yes  Long acting insulin:  Short acting insulin: Blood Pressure Monitoring: rarely Retinal Examination: Up to Date Foot Exam: Up to Date Pneumovax: Up to Date Influenza: refused Aspirin: no   HYPERTENSION / HYPERLIPIDEMIA She reports increase stressors.  Worrying about "family problems".  She is having bathroom issues at home and endorses not taking her Lasix because of her broken bathroom at home, she is showering at her dad's house right now.  Reports she took one Lasix pill last night, but since weekend has not been taking consistently.  Is followed by cardiology and is due to see provider in February, she reports she is scheduling an appointment today.   Satisfied with current treatment? no Duration of hypertension: chronic BP monitoring frequency: not checking BP range:  BP medication side effects: no Duration of hyperlipidemia: chronic Cholesterol medication side effects: no Cholesterol supplements: none Medication compliance: good compliance Aspirin: no Recent stressors: yes Recurrent headaches: no Visual changes: no Palpitations: no Dyspnea:  no Chest pain: no Lower extremity edema: no Dizzy/lightheaded: no  Relevant past medical, surgical, family and social history reviewed and updated as indicated. Interim medical history since our last visit reviewed. Allergies and medications reviewed and updated.  Review of Systems  Constitutional: Negative for activity change, appetite change, diaphoresis, fatigue and fever.  Respiratory: Positive for shortness of breath (baseline per patient). Negative for cough and chest tightness.   Cardiovascular: Negative for chest pain, palpitations and leg swelling.  Gastrointestinal: Negative for abdominal distention, abdominal pain, constipation, diarrhea, nausea and vomiting.  Endocrine: Negative for cold intolerance, heat intolerance, polydipsia, polyphagia and polyuria.  Neurological: Negative for dizziness, syncope, weakness, light-headedness, numbness and headaches.  Psychiatric/Behavioral: Negative.     Per HPI unless specifically indicated above     Objective:    BP (!) 156/93 (BP Location: Right Arm, Patient Position: Sitting)   Pulse 75   Temp 98.9 F (37.2 C)   Ht 5\' 8"  (1.727 m)   Wt (!) 386 lb 6 oz (175.3 kg)   SpO2 95%   BMI 58.75 kg/m   Wt Readings from Last 3 Encounters:  02/24/18 (!) 386 lb 6 oz (175.3 kg)  11/18/17 (!) 378 lb 8 oz (171.7 kg)  09/17/17 (!) 383 lb 4 oz (173.8 kg)    Physical Exam Vitals signs and nursing note reviewed.  Constitutional:      General: She is awake.     Appearance: She is well-developed.  HENT:     Head: Normocephalic.     Right Ear: Hearing  normal.     Left Ear: Hearing normal.     Nose: Nose normal.     Mouth/Throat:     Mouth: Mucous membranes are moist.  Eyes:     General: Lids are normal.        Right eye: No discharge.        Left eye: No discharge.     Conjunctiva/sclera: Conjunctivae normal.     Pupils: Pupils are equal, round, and reactive to light.  Neck:     Musculoskeletal: Normal range of motion and neck  supple.     Thyroid: No thyromegaly.     Vascular: No carotid bruit or JVD.  Cardiovascular:     Rate and Rhythm: Normal rate and regular rhythm.     Heart sounds: Normal heart sounds. No murmur. No gallop.   Pulmonary:     Effort: Pulmonary effort is normal.     Breath sounds: Normal breath sounds.  Abdominal:     General: Bowel sounds are normal.     Palpations: Abdomen is soft. There is no hepatomegaly or splenomegaly.  Musculoskeletal:     Right lower leg: Edema (1+) present.     Left lower leg: Edema (1+) present.  Lymphadenopathy:     Cervical: No cervical adenopathy.  Skin:    General: Skin is warm and dry.  Neurological:     Mental Status: She is alert and oriented to person, place, and time.  Psychiatric:        Attention and Perception: Attention normal.        Mood and Affect: Mood normal.        Behavior: Behavior normal. Behavior is cooperative.        Thought Content: Thought content normal.        Judgment: Judgment normal.    Diabetic Foot Exam - Simple   Simple Foot Form Visual Inspection See comments:  Yes Sensation Testing Intact to touch and monofilament testing bilaterally:  Yes Pulse Check Posterior Tibialis and Dorsalis pulse intact bilaterally:  Yes Comments Calluses on heels and small abrasion with crusting to left lower anterior calf.     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  Initial BP elevated  with improvement with repeat, but still above goal.  Has not been taking daily Lasix due to bathroom issues.  Followed by cardiology with upcoming appointment.  Will defer changes to them at this time.      Relevant Orders   Microalbumin, Urine Waived   Basic Metabolic Panel (BMET)     Endocrine   Hyperlipidemia associated with type 2 diabetes mellitus (HCC)    Chronic, ongoing.  LDL 65 and TCHOL 112.  Continue current regimen.      Insulin dependent diabetes mellitus (HCC)    Chronic, ongoing.  A1C today 7.9%.  Will maintain current  regimen and have recommended cutting back on [facility] + heavy focus on diet with weight loss.  She endorses being noncompliant.  Referral to endocrinology.      Relevant Orders   Bayer DCA Hb A1c Waived   Ambulatory referral to Endocrinology     Other   Elevated TSH    Recheck TSH today.      Relevant Orders   Thyroid Panel With TSH   BMI 50.0-59.9, adult (HCC)    Chronic, recommend increased focus on diet and exercise.          Follow up plan: Return in about 3 months (around 05/26/2018) for T2DM.

## 2018-02-25 LAB — BASIC METABOLIC PANEL
BUN/Creatinine Ratio: 18 (ref 9–23)
BUN: 19 mg/dL (ref 6–24)
CO2: 26 mmol/L (ref 20–29)
Calcium: 8.4 mg/dL — ABNORMAL LOW (ref 8.7–10.2)
Chloride: 101 mmol/L (ref 96–106)
Creatinine, Ser: 1.07 mg/dL — ABNORMAL HIGH (ref 0.57–1.00)
GFR calc Af Amer: 70 mL/min/{1.73_m2} (ref >59)
GFR calc non Af Amer: 61 mL/min/{1.73_m2} (ref >59)
Glucose: 158 mg/dL — ABNORMAL HIGH (ref 65–99)
Potassium: 4.5 mmol/L (ref 3.5–5.2)
SODIUM: 141 mmol/L (ref 134–144)

## 2018-02-25 LAB — THYROID PANEL WITH TSH
Free Thyroxine Index: 1.7 (ref 1.2–4.9)
T3 Uptake Ratio: 25 % (ref 24–39)
T4, Total: 6.9 ug/dL (ref 4.5–12.0)
TSH: 3.16 u[IU]/mL (ref 0.450–4.500)

## 2018-03-04 ENCOUNTER — Other Ambulatory Visit: Payer: Self-pay | Admitting: Internal Medicine

## 2018-03-31 ENCOUNTER — Other Ambulatory Visit: Payer: Self-pay | Admitting: Nurse Practitioner

## 2018-04-01 ENCOUNTER — Telehealth: Payer: Self-pay

## 2018-04-01 MED ORDER — FUROSEMIDE 40 MG PO TABS: 40.0000 mg | ORAL_TABLET | Freq: Two times a day (BID) | ORAL | 1 refills | Status: DC

## 2018-04-01 NOTE — Telephone Encounter (Signed)
OK to refill furosemide 40 mg twice daily. #60 w/ 1 refill just to make sure she keeps her follow up appointment- Thanks!!

## 2018-04-01 NOTE — Telephone Encounter (Addendum)
Pharmacy is requesting refill of Furosemide 40 mg twice a day.  Please review for refill.  Medication changes due to elevated BP and Kidney issues. Has an appointment scheduled with Ward Givens 04/21/2018

## 2018-04-02 DIAGNOSIS — E1165 Type 2 diabetes mellitus with hyperglycemia: Secondary | ICD-10-CM | POA: Diagnosis not present

## 2018-04-02 DIAGNOSIS — I10 Essential (primary) hypertension: Secondary | ICD-10-CM | POA: Diagnosis not present

## 2018-04-02 DIAGNOSIS — E1129 Type 2 diabetes mellitus with other diabetic kidney complication: Secondary | ICD-10-CM | POA: Diagnosis not present

## 2018-04-02 DIAGNOSIS — R809 Proteinuria, unspecified: Secondary | ICD-10-CM | POA: Diagnosis not present

## 2018-04-02 DIAGNOSIS — E1169 Type 2 diabetes mellitus with other specified complication: Secondary | ICD-10-CM | POA: Diagnosis not present

## 2018-04-02 DIAGNOSIS — E785 Hyperlipidemia, unspecified: Secondary | ICD-10-CM | POA: Diagnosis not present

## 2018-04-02 DIAGNOSIS — Z794 Long term (current) use of insulin: Secondary | ICD-10-CM | POA: Diagnosis not present

## 2018-04-02 DIAGNOSIS — Z6841 Body Mass Index (BMI) 40.0 and over, adult: Secondary | ICD-10-CM | POA: Diagnosis not present

## 2018-04-02 DIAGNOSIS — E1159 Type 2 diabetes mellitus with other circulatory complications: Secondary | ICD-10-CM | POA: Diagnosis not present

## 2018-04-06 DIAGNOSIS — L851 Acquired keratosis [keratoderma] palmaris et plantaris: Secondary | ICD-10-CM | POA: Diagnosis not present

## 2018-04-06 DIAGNOSIS — E119 Type 2 diabetes mellitus without complications: Secondary | ICD-10-CM | POA: Diagnosis not present

## 2018-04-06 DIAGNOSIS — M79674 Pain in right toe(s): Secondary | ICD-10-CM | POA: Diagnosis not present

## 2018-04-06 DIAGNOSIS — B351 Tinea unguium: Secondary | ICD-10-CM | POA: Diagnosis not present

## 2018-04-06 DIAGNOSIS — L600 Ingrowing nail: Secondary | ICD-10-CM | POA: Diagnosis not present

## 2018-04-06 DIAGNOSIS — M79675 Pain in left toe(s): Secondary | ICD-10-CM | POA: Diagnosis not present

## 2018-04-16 DIAGNOSIS — E1159 Type 2 diabetes mellitus with other circulatory complications: Secondary | ICD-10-CM | POA: Diagnosis not present

## 2018-04-16 DIAGNOSIS — R809 Proteinuria, unspecified: Secondary | ICD-10-CM | POA: Diagnosis not present

## 2018-04-16 DIAGNOSIS — E1129 Type 2 diabetes mellitus with other diabetic kidney complication: Secondary | ICD-10-CM | POA: Diagnosis not present

## 2018-04-16 DIAGNOSIS — Z794 Long term (current) use of insulin: Secondary | ICD-10-CM | POA: Diagnosis not present

## 2018-04-16 DIAGNOSIS — E785 Hyperlipidemia, unspecified: Secondary | ICD-10-CM | POA: Diagnosis not present

## 2018-04-16 DIAGNOSIS — E1165 Type 2 diabetes mellitus with hyperglycemia: Secondary | ICD-10-CM | POA: Diagnosis not present

## 2018-04-16 DIAGNOSIS — Z6841 Body Mass Index (BMI) 40.0 and over, adult: Secondary | ICD-10-CM | POA: Diagnosis not present

## 2018-04-16 DIAGNOSIS — I10 Essential (primary) hypertension: Secondary | ICD-10-CM | POA: Diagnosis not present

## 2018-04-16 DIAGNOSIS — E1169 Type 2 diabetes mellitus with other specified complication: Secondary | ICD-10-CM | POA: Diagnosis not present

## 2018-04-21 ENCOUNTER — Ambulatory Visit: Payer: Medicare Other | Admitting: Nurse Practitioner

## 2018-04-21 ENCOUNTER — Telehealth: Payer: Self-pay

## 2018-04-21 NOTE — Telephone Encounter (Signed)
Call to patient to reschedule in regards to COVID-19 restrictions.  ?  She is comfortable with rescheduling at this time and denies any current sx.  ?  Message routed to cv [id number] pool for rescheduling.   

## 2018-04-21 NOTE — Progress Notes (Deleted)
Office Visit    Patient Name: Sara Zhang Date of Encounter: 04/21/2018  Primary Care Provider:  Venita Lick, NP Primary Cardiologist:  Nelva Bush, MD  Chief Complaint    ***  Past Medical History    Past Medical History:  Diagnosis Date  . Chronic diastolic CHF (congestive heart failure) ([place])    a. 02/2012 Echo: EF 25-35%, glob HK; b. 06/2016 Echo: EF 55-60%, mild MR, nl PASP.  Marland Kitchen CKD (chronic kidney disease), stage II   . Diabetes mellitus without complication ([place])   . Extreme obesity   . Hyperlipidemia   . Hypertension   . Hypoxia   . Microcytic anemia   . Sleep apnea   . Vaginal bleeding    a. 01/2017 s/p hyteroscopy and polypectomy [facility]  . Vitamin D deficiency    Past Surgical History:  Procedure Laterality Date  . CESAREAN SECTION    . CHOLECYSTECTOMY    . DILATION AND CURETTAGE, DIAGNOSTIC / THERAPEUTIC    . TONSILLECTOMY AND ADENOIDECTOMY    . TOOTH EXTRACTION  01/2016    Allergies  No Known Allergies  History of Present Illness    ***  Home Medications    Prior to Admission medications   Medication Sig Start Date End Date Taking? Authorizing Provider  atenolol (TENORMIN) 50 MG tablet Take 1 tablet (50 mg total) by mouth daily. 06/11/17 09/09/17  End, Harrell Gave, MD  atorvastatin (LIPITOR) 10 MG tablet Take 1 tablet (10 mg total) by mouth daily. 12/09/17   Marnee Guarneri T, NP  blood glucose meter kit and supplies KIT Dispense based on patient and insurance preference. Use up to four times daily as directed. (FOR ICD-9 250.00, 250.01). 11/18/17   Cannady, Henrine Screws T, NP  Exenatide ER (BYDUREON BCISE) 2 MG/0.85ML AUIJ Inject 2 mg into the skin once a week. 05/26/17   Kathrine Haddock, NP  ferrous sulfate 325 (65 FE) MG tablet Take 1 tablet (325 mg total) by mouth daily with breakfast. 05/27/16   Kathrine Haddock, NP  furosemide (LASIX) 40 MG tablet Take 1 tablet (40 mg total) by mouth 2 (two) times daily. 04/01/18 06/30/18  Theora Gianotti, NP  glipiZIDE (GLUCOTROL) 10 MG tablet TAKE 1 TABLET BY MOUTH TWICE A DAY WITH MEALS 04/01/18   Cannady, Jolene T, NP  glucose blood (CONTOUR NEXT TEST) test strip TEST BLOOD SUGAR ONCE DAILY 01/09/18   Cannady, Henrine Screws T, NP  Insulin Glargine (TOUJEO SOLOSTAR) 300 UNIT/ML SOPN Inject 54 Units into the skin at bedtime. 11/18/17   Cannady, Henrine Screws T, NP  Insulin Pen Needle 32G X 4 MM MISC 1 Units by Does not apply route every morning. Pen needles 08/21/16   Kathrine Haddock, NP  levonorgestrel (MIRENA) 20 MCG/24HR IUD 1 each by Intrauterine route once.    [provider]  lisinopril (PRINIVIL,ZESTRIL) 40 MG tablet TAKE 1 TABLET BY MOUTH ONCE DAILY 03/04/18   End, Harrell Gave, MD  OXYGEN Inhale 2 L into the lungs daily.    [provider]    Review of Systems    ***.  All other systems reviewed and are otherwise negative except as noted above.  Physical Exam    VS:  There were no vitals taken for this visit. , BMI There is no height or weight on file to calculate BMI. GEN: Well nourished, well developed, in no acute distress. HEENT: normal. Neck: Supple, no JVD, carotid bruits, or masses. Cardiac: RRR, no murmurs, rubs, or gallops. No clubbing, cyanosis,  edema.  Radials/DP/PT 2+ and equal bilaterally.  Respiratory:  Respirations regular and unlabored, clear to auscultation bilaterally. GI: Soft, nontender, nondistended, BS + x 4. MS: no deformity or atrophy. Skin: warm and dry, no rash. Neuro:  Strength and sensation are intact. Psych: Normal affect.  Accessory Clinical Findings    ECG personally reviewed by me today - *** - no acute changes.  Assessment & Plan    1.  ***   Murray Hodgkins, NP 04/21/2018, 8:18 AM

## 2018-05-04 NOTE — Telephone Encounter (Signed)
Patient denied evist - wants to wait to see in office at a later time

## 2018-05-14 NOTE — Telephone Encounter (Signed)
Recall placed for august  

## 2018-05-20 ENCOUNTER — Other Ambulatory Visit: Payer: Self-pay | Admitting: Nurse Practitioner

## 2018-05-20 DIAGNOSIS — E119 Type 2 diabetes mellitus without complications: Principal | ICD-10-CM

## 2018-05-20 DIAGNOSIS — Z794 Long term (current) use of insulin: Principal | ICD-10-CM

## 2018-05-20 DIAGNOSIS — IMO0001 Reserved for inherently not codable concepts without codable children: Secondary | ICD-10-CM

## 2018-05-20 NOTE — Progress Notes (Signed)
Lab orders only 

## 2018-05-26 ENCOUNTER — Other Ambulatory Visit: Payer: Self-pay

## 2018-05-26 ENCOUNTER — Encounter: Payer: Self-pay | Admitting: Nurse Practitioner

## 2018-05-26 ENCOUNTER — Ambulatory Visit (INDEPENDENT_AMBULATORY_CARE_PROVIDER_SITE_OTHER): Payer: Medicare Other | Admitting: Nurse Practitioner

## 2018-05-26 VITALS — Ht 69.0 in

## 2018-05-26 DIAGNOSIS — E1169 Type 2 diabetes mellitus with other specified complication: Secondary | ICD-10-CM | POA: Diagnosis not present

## 2018-05-26 DIAGNOSIS — M25562 Pain in left knee: Secondary | ICD-10-CM | POA: Diagnosis not present

## 2018-05-26 DIAGNOSIS — N182 Chronic kidney disease, stage 2 (mild): Secondary | ICD-10-CM | POA: Diagnosis not present

## 2018-05-26 DIAGNOSIS — I130 Hypertensive heart and chronic kidney disease with heart failure and stage 1 through stage 4 chronic kidney disease, or unspecified chronic kidney disease: Secondary | ICD-10-CM

## 2018-05-26 DIAGNOSIS — IMO0001 Reserved for inherently not codable concepts without codable children: Secondary | ICD-10-CM

## 2018-05-26 DIAGNOSIS — E785 Hyperlipidemia, unspecified: Secondary | ICD-10-CM | POA: Diagnosis not present

## 2018-05-26 DIAGNOSIS — E119 Type 2 diabetes mellitus without complications: Secondary | ICD-10-CM

## 2018-05-26 DIAGNOSIS — Z794 Long term (current) use of insulin: Secondary | ICD-10-CM

## 2018-05-26 NOTE — Patient Instructions (Signed)
Carbohydrate Counting for Diabetes Mellitus, Adult  Carbohydrate counting is a method of keeping track of how many carbohydrates you eat. Eating carbohydrates naturally increases the amount of sugar (glucose) in the blood. Counting how many carbohydrates you eat helps keep your blood glucose within normal limits, which helps you manage your diabetes (diabetes mellitus). It is important to know how many carbohydrates you can safely have in each meal. This is different for every person. A diet and nutrition specialist (registered dietitian) can help you make a meal plan and calculate how many carbohydrates you should have at each meal and snack. Carbohydrates are found in the following foods:  Grains, such as breads and cereals.  Dried beans and soy products.  Starchy vegetables, such as potatoes, peas, and corn.  Fruit and fruit juices.  Milk and yogurt.  Sweets and snack foods, such as cake, cookies, candy, chips, and soft drinks. How do I count carbohydrates? There are two ways to count carbohydrates in food. You can use either of the methods or a combination of both. Reading "Nutrition Facts" on packaged food The "Nutrition Facts" list is included on the labels of almost all packaged foods and beverages in the [place] It includes:  The serving size.  Information about nutrients in each serving, including the grams (g) of carbohydrate per serving. To use the "Nutrition Facts":  Decide how many servings you will have.  Multiply the number of servings by the number of carbohydrates per serving.  The resulting number is the total amount of carbohydrates that you will be having. Learning standard serving sizes of other foods When you eat carbohydrate foods that are not packaged or do not include "Nutrition Facts" on the label, you need to measure the servings in order to count the amount of carbohydrates:  Measure the foods that you will eat with a food scale or measuring cup, if needed.   Decide how many standard-size servings you will eat.  Multiply the number of servings by 15. Most carbohydrate-rich foods have about 15 g of carbohydrates per serving. ? For example, if you eat 8 oz (170 g) of strawberries, you will have eaten 2 servings and 30 g of carbohydrates (2 servings x 15 g = 30 g).  For foods that have more than one food mixed, such as soups and casseroles, you must count the carbohydrates in each food that is included. The following list contains standard serving sizes of common carbohydrate-rich foods. Each of these servings has about 15 g of carbohydrates:   hamburger bun or  English muffin.   oz (15 mL) syrup.   oz (14 g) jelly.  1 slice of bread.  1 six-inch tortilla.  3 oz (85 g) cooked rice or pasta.  4 oz (113 g) cooked dried beans.  4 oz (113 g) starchy vegetable, such as peas, corn, or potatoes.  4 oz (113 g) hot cereal.  4 oz (113 g) mashed potatoes or  of a large baked potato.  4 oz (113 g) canned or frozen fruit.  4 oz (120 mL) fruit juice.  4-6 crackers.  6 chicken nuggets.  6 oz (170 g) unsweetened dry cereal.  6 oz (170 g) plain fat-free yogurt or yogurt sweetened with artificial sweeteners.  8 oz (240 mL) milk.  8 oz (170 g) fresh fruit or one small piece of fruit.  24 oz (680 g) popped popcorn. Example of carbohydrate counting Sample meal  3 oz (85 g) chicken breast.  6 oz (170 g)   brown rice.  4 oz (113 g) corn.  8 oz (240 mL) milk.  8 oz (170 g) strawberries with sugar-free whipped topping. Carbohydrate calculation 1. Identify the foods that contain carbohydrates: ? Rice. ? Corn. ? Milk. ? Strawberries. 2. Calculate how many servings you have of each food: ? 2 servings rice. ? 1 serving corn. ? 1 serving milk. ? 1 serving strawberries. 3. Multiply each number of servings by 15 g: ? 2 servings rice x 15 g = 30 g. ? 1 serving corn x 15 g = 15 g. ? 1 serving milk x 15 g = 15 g. ? 1 serving  strawberries x 15 g = 15 g. 4. Add together all of the amounts to find the total grams of carbohydrates eaten: ? 30 g + 15 g + 15 g + 15 g = 75 g of carbohydrates total. Summary  Carbohydrate counting is a method of keeping track of how many carbohydrates you eat.  Eating carbohydrates naturally increases the amount of sugar (glucose) in the blood.  Counting how many carbohydrates you eat helps keep your blood glucose within normal limits, which helps you manage your diabetes.  A diet and nutrition specialist (registered dietitian) can help you make a meal plan and calculate how many carbohydrates you should have at each meal and snack. This information is not intended to replace advice given to you by your health care provider. Make sure you discuss any questions you have with your health care provider. Document Released: 01/21/2005 Document Revised: 07/31/2016 Document Reviewed: 07/05/2015 Elsevier Interactive Patient Education  2019 Elsevier Inc.  

## 2018-05-26 NOTE — Progress Notes (Signed)
Ht 5\' 9"  (1.753 m)   BMI 57.06 kg/m    Subjective:    Patient ID: Sara Zhang, female    DOB: 1970/12/04, 48 y.o.   MRN: [id number]  HPI: Sara Zhang is a 48 y.o. female  Chief Complaint  Patient presents with  . Diabetes    89m f/u    . This visit was completed via WebEx due to the restrictions of the COVID-19 pandemic. All issues as above were discussed and addressed. Physical exam was done as above through visual confirmation on WebEx. If it was felt that the patient should be evaluated in the office, they were directed there. The patient verbally consented to this visit. . Location of the patient: home . Location of the provider: home . Those involved with this call:  . Provider: Aura Dials, DNP . CMA: Elton Sin, CMA . Front Desk/Registration: Adela Ports  . Time spent on call: 20 minutes with patient face to face via video conference. More than 50% of this time was spent in counseling and coordination of care. 10 minutes total spent in review of patient's record and preparation of their chart.  DIABETES Current medications include Glipizide 5 MG BID, Toujeo 54 units QHS, and Bydureon 2 MG weekly.  Her previous A1C in January was trending downwards to 7.9%.  Has not had labs done for this visit, although they are ordered to be obtained outpatient.  She is followed by endocrinology and her last visit with thme was 04/16/2018 where her Toujeo was increased to 56 units. She sees them Thursday for blood work and will send her A1C result to provider via MyChart.  Hypoglycemic episodes:no Polydipsia/polyuria: no Visual disturbance: no Chest pain: no Paresthesias: no Glucose Monitoring: yes  Accucheck frequency: BID  Fasting glucose: 120's  Post prandial:  Evening: 170-251  Before meals: Taking Insulin?: yes  Long acting insulin:  Short acting insulin: Blood Pressure Monitoring: not checking Retinal Examination: Not up to Date Foot Exam: Not up date  Pneumovax: Up to Date Influenza: Up to Date Aspirin: no   HYPERTENSION / HYPERLIPIDEMIA Current medications include Lisinopril, Furosemide, Atenolol for HTN + Lipitor for HLD.  Followed by cardiology and last saw Dr. Okey Dupre 09/17/17. Satisfied with current treatment? yes Duration of hypertension: chronic BP monitoring frequency: not checking BP range:  BP medication side effects: no Duration of hyperlipidemia: chronic Cholesterol medication side effects: no Cholesterol supplements: none Medication compliance: good compliance Aspirin: no Recent stressors: no Recurrent headaches: no Visual changes: no Palpitations: no Dyspnea: no Chest pain: no Lower extremity edema: no Dizzy/lightheaded: no   KNEE PAIN Started to have left knee pain two weeks ago, was walking up 17 steps to her son's home and feels like she twisted.  Denies falling.  Reports pain was on right side in middle.  States pain is gone, but now the knee bends slowly and feels stiff.  If she bends it the knee feels "a little uncomfortable" She states overall the pain is improving. Duration: weeks Involved knee: left Mechanism of injury: unknown Location:medial Onset: gradual Severity: 10/10 at first, but now the pain is not as bad but she is limping Quality:  sharp, dull and aching Frequency: only if moves the wrong way or moves in general Radiation: no Aggravating factors: stairs, bending and movement  Alleviating factors: Blue gel and APAP  Status: better Treatments attempted: APAP  Relief with NSAIDs?:  No NSAIDs Taken Weakness with weight bearing or walking: no Sensation of giving way:  no Locking: no Popping: no Bruising: no Swelling: no Redness: no Paresthesias/decreased sensation: no Fevers: no  Relevant past medical, surgical, family and social history reviewed and updated as indicated. Interim medical history since our last visit reviewed. Allergies and medications reviewed and updated.  Review of  Systems  Constitutional: Negative for activity change, appetite change, diaphoresis, fatigue and fever.  Respiratory: Negative for cough, chest tightness and shortness of breath.   Cardiovascular: Negative for chest pain, palpitations and leg swelling.  Gastrointestinal: Negative for abdominal distention, abdominal pain, constipation, diarrhea, nausea and vomiting.  Endocrine: Negative for cold intolerance, heat intolerance, polydipsia, polyphagia and polyuria.  Musculoskeletal: Positive for arthralgias.  Neurological: Negative for dizziness, syncope, weakness, light-headedness, numbness and headaches.  Psychiatric/Behavioral: Negative.     Per HPI unless specifically indicated above     Objective:    Ht 5\' 9"  (1.753 m)   BMI 57.06 kg/m   Wt Readings from Last 3 Encounters:  02/24/18 (!) 386 lb 6 oz (175.3 kg)  11/18/17 (!) 378 lb 8 oz (171.7 kg)  09/17/17 (!) 383 lb 4 oz (173.8 kg)    Physical Exam Vitals signs and nursing note reviewed.  Constitutional:      General: She is awake.     Appearance: She is well-developed. She is obese. She is not ill-appearing.  HENT:     Head: Normocephalic.     Right Ear: Hearing normal.     Left Ear: Hearing normal.     Nose: Nose normal.     Mouth/Throat:     Mouth: Mucous membranes are moist.  Eyes:     General: Lids are normal.        Right eye: No discharge.        Left eye: No discharge.     Conjunctiva/sclera: Conjunctivae normal.  Neck:     Musculoskeletal: Normal range of motion.  Cardiovascular:     Comments: Unable to auscultate due to virtual visit only Pulmonary:     Effort: Pulmonary effort is normal. No accessory muscle usage or respiratory distress.     Comments: Unable to auscultate due to virtual visit only Musculoskeletal:     Right knee: She exhibits normal range of motion, no swelling, no laceration and no erythema. No tenderness found.     Left knee: She exhibits normal range of motion, no swelling and no  erythema. No tenderness found.     Comments: Via Virtual assessed ROM with patient assistance.  No decrease in ROM bilateral knees and no tenderness on palpation.  She reports some mild discomfort with flexion of knee.  Neurological:     Mental Status: She is alert and oriented to person, place, and time.  Psychiatric:        Attention and Perception: Attention normal.        Mood and Affect: Mood normal.        Behavior: Behavior normal. Behavior is cooperative.        Thought Content: Thought content normal.        Judgment: Judgment normal.     Results for orders placed or performed in visit on 02/24/18  Lipid Panel Piccolo, [employer]  Result Value Ref Range   Cholesterol Piccolo, Waived 112 <200 mg/dL   HDL Chol Piccolo, Waived 35 (L) >59 mg/dL   Triglycerides Piccolo,Waived 56 <150 mg/dL   Chol/HDL Ratio Piccolo,Waive 3.2 mg/dL   LDL Chol Calc Piccolo Waived 65 <100 mg/dL   VLDL Chol Calc Piccolo,Waive 11 <30 mg/dL  Bayer DCA Hb A1c Waived  Result Value Ref Range   HB A1C (BAYER DCA - WAIVED) 7.9 (H) <7.0 %  Microalbumin, Urine Waived  Result Value Ref Range   Microalb, Ur Waived 150 (H) 0 - 19 mg/L   Creatinine, Urine Waived 200 10 - 300 mg/dL   Microalb/Creat Ratio 30-300 (H) <30 mg/g  Basic Metabolic Panel (BMET)  Result Value Ref Range   Glucose 158 (H) 65 - 99 mg/dL   BUN 19 6 - 24 mg/dL   Creatinine, Ser 1.611.07 (H) 0.57 - 1.00 mg/dL   GFR calc non Af Amer 61 >59 mL/min/1.73   GFR calc Af Amer 70 >59 mL/min/1.73   BUN/Creatinine Ratio 18 9 - 23   Sodium 141 134 - 144 mmol/L   Potassium 4.5 3.5 - 5.2 mmol/L   Chloride 101 96 - 106 mmol/L   CO2 26 20 - 29 mmol/L   Calcium 8.4 (L) 8.7 - 10.2 mg/dL  Thyroid Panel With TSH  Result Value Ref Range   TSH 3.160 0.450 - 4.500 uIU/mL   T4, Total 6.9 4.5 - 12.0 ug/dL   T3 Uptake Ratio 25 24 - 39 %   Free Thyroxine Index 1.7 1.2 - 4.9      Assessment & Plan:   Problem List Items Addressed This Visit      Cardiovascular  and Mediastinum   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  Unable to get BP today due to virtual visit and patient with no cuff.  Have recommended obtaining cuff and checking BP at home three times a week in morning. Continue current medication regimen and collaboration with cardiology.  Return in 3 months.        Endocrine   Hyperlipidemia associated with type 2 diabetes mellitus (HCC)    Chronic, ongoing.  Continue current medication regimen.  Lipid panel and CMP next visit.      Insulin dependent diabetes mellitus (HCC) - Primary    Chronic, ongoing.  She endorses poor diet choices.  Last A1C 7.9% and is having labs Thursday with endocrinology to recheck A1C.  She is to pass these results onto provider via MyChart.  Continue current medication regimen and collaboration with endocrinology.        Other   Acute pain of left knee    As she reports this is improving, will continue simple treatment at home.  Recommend continued use of Tylenol as needed, avoid NSAID due to HTN, and applying ice to area every 2 hours for 20 minutes + rest.  May continue use of Biofreeze and gels to knee.  If worsening or continued discomfort then return to office and will obtain imaging.           I discussed the assessment and treatment plan with the patient. The patient was provided an opportunity to ask questions and all were answered. The patient agreed with the plan and demonstrated an understanding of the instructions.   The patient was advised to call back or seek an in-person evaluation if the symptoms worsen or if the condition fails to improve as anticipated.   I provided 20 minutes of time during this encounter.  Follow up plan: Return in about 3 months (around 08/25/2018) for T2DM, HTN/HLD -- will needs labs.

## 2018-05-26 NOTE — Assessment & Plan Note (Signed)
As she reports this is improving, will continue simple treatment at home.  Recommend continued use of Tylenol as needed, avoid NSAID due to HTN, and applying ice to area every 2 hours for 20 minutes + rest.  May continue use of Biofreeze and gels to knee.  If worsening or continued discomfort then return to office and will obtain imaging.

## 2018-05-26 NOTE — Assessment & Plan Note (Signed)
Chronic, ongoing.  She endorses poor diet choices.  Last A1C 7.9% and is having labs Thursday with endocrinology to recheck A1C.  She is to pass these results onto provider via MyChart.  Continue current medication regimen and collaboration with endocrinology.

## 2018-05-26 NOTE — Assessment & Plan Note (Signed)
Chronic, ongoing.  Unable to get BP today due to virtual visit and patient with no cuff.  Have recommended obtaining cuff and checking BP at home three times a week in morning. Continue current medication regimen and collaboration with cardiology.  Return in 3 months.

## 2018-05-26 NOTE — Assessment & Plan Note (Signed)
Chronic, ongoing.  Continue current medication regimen.  Lipid panel and CMP next visit. 

## 2018-05-28 DIAGNOSIS — E1129 Type 2 diabetes mellitus with other diabetic kidney complication: Secondary | ICD-10-CM | POA: Diagnosis not present

## 2018-05-28 DIAGNOSIS — E1159 Type 2 diabetes mellitus with other circulatory complications: Secondary | ICD-10-CM | POA: Diagnosis not present

## 2018-05-28 DIAGNOSIS — E785 Hyperlipidemia, unspecified: Secondary | ICD-10-CM | POA: Diagnosis not present

## 2018-05-28 DIAGNOSIS — E1169 Type 2 diabetes mellitus with other specified complication: Secondary | ICD-10-CM | POA: Diagnosis not present

## 2018-05-28 DIAGNOSIS — E1165 Type 2 diabetes mellitus with hyperglycemia: Secondary | ICD-10-CM | POA: Diagnosis not present

## 2018-05-28 DIAGNOSIS — I10 Essential (primary) hypertension: Secondary | ICD-10-CM | POA: Diagnosis not present

## 2018-05-28 DIAGNOSIS — R809 Proteinuria, unspecified: Secondary | ICD-10-CM | POA: Diagnosis not present

## 2018-05-28 DIAGNOSIS — Z794 Long term (current) use of insulin: Secondary | ICD-10-CM | POA: Diagnosis not present

## 2018-05-28 DIAGNOSIS — Z6841 Body Mass Index (BMI) 40.0 and over, adult: Secondary | ICD-10-CM | POA: Diagnosis not present

## 2018-06-04 ENCOUNTER — Other Ambulatory Visit: Payer: Self-pay | Admitting: Nurse Practitioner

## 2018-06-04 NOTE — Telephone Encounter (Signed)
Requested Prescriptions  Pending Prescriptions Disp Refills  . atorvastatin (LIPITOR) 10 MG tablet [Pharmacy Med Name: ATORVASTATIN 10MG  TABLETS] 90 tablet 1    Sig: TAKE 1 TABLET BY MOUTH EVERY DAY     Cardiovascular:  Antilipid - Statins Failed - 06/04/2018 10:45 AM      Failed - HDL in normal range and within 360 days    HDL  Date Value Ref Range Status  08/20/2017 38 (L) >39 mg/dL Final         Passed - Total Cholesterol in normal range and within 360 days    Cholesterol Piccolo, Waived  Date Value Ref Range Status  02/24/2018 112 <200 mg/dL Final    Comment:                            Desirable                <200                         Borderline High      200- 239                         High                     >239          Passed - LDL in normal range and within 360 days    LDL Calculated  Date Value Ref Range Status  08/20/2017 52 0 - 99 mg/dL Final         Passed - Triglycerides in normal range and within 360 days    Triglycerides Piccolo,Waived  Date Value Ref Range Status  02/24/2018 56 <150 mg/dL Final    Comment:                            Normal                   <150                         Borderline High     150 - 199                         High                200 - 499                         Very High                >499          Passed - Patient is not pregnant      Passed - Valid encounter within last 12 months    Recent Outpatient Visits          1 week ago Insulin dependent diabetes mellitus (HCC)   [facility] Remington, Jolene T, NP   3 months ago 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

## 2018-06-08 ENCOUNTER — Other Ambulatory Visit: Payer: Self-pay

## 2018-06-08 MED ORDER — FUROSEMIDE 40 MG PO TABS: 40.0000 mg | ORAL_TABLET | Freq: Two times a day (BID) | ORAL | 0 refills | Status: DC

## 2018-07-22 DIAGNOSIS — E1165 Type 2 diabetes mellitus with hyperglycemia: Secondary | ICD-10-CM | POA: Diagnosis not present

## 2018-07-22 DIAGNOSIS — R809 Proteinuria, unspecified: Secondary | ICD-10-CM | POA: Diagnosis not present

## 2018-07-22 DIAGNOSIS — Z794 Long term (current) use of insulin: Secondary | ICD-10-CM | POA: Diagnosis not present

## 2018-07-22 DIAGNOSIS — E1159 Type 2 diabetes mellitus with other circulatory complications: Secondary | ICD-10-CM | POA: Diagnosis not present

## 2018-07-22 DIAGNOSIS — E785 Hyperlipidemia, unspecified: Secondary | ICD-10-CM | POA: Diagnosis not present

## 2018-07-22 DIAGNOSIS — I10 Essential (primary) hypertension: Secondary | ICD-10-CM | POA: Diagnosis not present

## 2018-07-22 DIAGNOSIS — Z6841 Body Mass Index (BMI) 40.0 and over, adult: Secondary | ICD-10-CM | POA: Diagnosis not present

## 2018-07-22 DIAGNOSIS — E1169 Type 2 diabetes mellitus with other specified complication: Secondary | ICD-10-CM | POA: Diagnosis not present

## 2018-07-22 DIAGNOSIS — E1129 Type 2 diabetes mellitus with other diabetic kidney complication: Secondary | ICD-10-CM | POA: Diagnosis not present

## 2018-08-10 DIAGNOSIS — G4733 Obstructive sleep apnea (adult) (pediatric): Secondary | ICD-10-CM | POA: Diagnosis not present

## 2018-08-10 DIAGNOSIS — I10 Essential (primary) hypertension: Secondary | ICD-10-CM | POA: Diagnosis not present

## 2018-08-10 DIAGNOSIS — E1140 Type 2 diabetes mellitus with diabetic neuropathy, unspecified: Secondary | ICD-10-CM | POA: Diagnosis not present

## 2018-08-19 ENCOUNTER — Ambulatory Visit (INDEPENDENT_AMBULATORY_CARE_PROVIDER_SITE_OTHER): Payer: Medicare Other

## 2018-08-19 DIAGNOSIS — Z0000 Encounter for general adult medical examination without abnormal findings: Secondary | ICD-10-CM

## 2018-08-19 NOTE — Patient Instructions (Signed)
Sara Zhang , Thank you for taking time to come for your Medicare Wellness Visit. I appreciate your ongoing commitment to your health goals. Please review the following plan we discussed and let me know if I can assist you in the future.   Screening recommendations/referrals: Colonoscopy: due at age 50  Mammogram: ordered Please call [phone] to schedule your mammogram.  Bone Density: not indicated  Recommended yearly ophthalmology/optometry visit for glaucoma screening and checkup Recommended yearly dental visit for hygiene and checkup  Vaccinations: Influenza vaccine: due 10/2018 Pneumococcal vaccine: up to date Tdap vaccine: due, check with your insurance company for coverage  Shingles vaccine: shingrix eligible at age 53, check with your insurance/pharmacy for coverage information    Advanced directives: please pick up a copy of this information next time you are in the office  Conditions/risks identified: diabetic- discussed chronic care management team.   Next appointment: follow up in one year for your annual wellness visit.   Preventive Care 40-64 Years, Female Preventive care refers to lifestyle choices and visits with your health care provider that can promote health and wellness. What does preventive care include?  A yearly physical exam. This is also called an annual well check.  Dental exams once or twice a year.  Routine eye exams. Ask your health care provider how often you should have your eyes checked.  Personal lifestyle choices, including:  Daily care of your teeth and gums.  Regular physical activity.  Eating a healthy diet.  Avoiding tobacco and drug use.  Limiting alcohol use.  Practicing safe sex.  Taking low-dose aspirin daily starting at age 27.  Taking vitamin and mineral supplements as recommended by your health care provider. What happens during an annual well check? The services and screenings done by your health care provider during  your annual well check will depend on your age, overall health, lifestyle risk factors, and family history of disease. Counseling  Your health care provider may ask you questions about your:  Alcohol use.  Tobacco use.  Drug use.  Emotional well-being.  Home and relationship well-being.  Sexual activity.  Eating habits.  Work and work Statistician.  Method of birth control.  Menstrual cycle.  Pregnancy history. Screening  You may have the following tests or measurements:  Height, weight, and BMI.  Blood pressure.  Lipid and cholesterol levels. These may be checked every 5 years, or more frequently if you are over 90 years old.  Skin check.  Lung cancer screening. You may have this screening every year starting at age 7 if you have a 30-pack-year history of smoking and currently smoke or have quit within the past 15 years.  Fecal occult blood test (FOBT) of the stool. You may have this test every year starting at age 66.  Flexible sigmoidoscopy or colonoscopy. You may have a sigmoidoscopy every 5 years or a colonoscopy every 10 years starting at age 12.  Hepatitis C blood test.  Hepatitis B blood test.  Sexually transmitted disease (STD) testing.  Diabetes screening. This is done by checking your blood sugar (glucose) after you have not eaten for a while (fasting). You may have this done every 1-3 years.  Mammogram. This may be done every 1-2 years. Talk to your health care provider about when you should start having regular mammograms. This may depend on whether you have a family history of breast cancer.  BRCA-related cancer screening. This may be done if you have a family history of breast, ovarian, tubal, or  peritoneal cancers.  Pelvic exam and Pap test. This may be done every 3 years starting at age 47. Starting at age 72, this may be done every 5 years if you have a Pap test in combination with an HPV test.  Bone density scan. This is done to screen for  osteoporosis. You may have this scan if you are at high risk for osteoporosis. Discuss your test results, treatment options, and if necessary, the need for more tests with your health care provider. Vaccines  Your health care provider may recommend certain vaccines, such as:  Influenza vaccine. This is recommended every year.  Tetanus, diphtheria, and acellular pertussis (Tdap, Td) vaccine. You may need a Td booster every 10 years.  Zoster vaccine. You may need this after age 29.  Pneumococcal 13-valent conjugate (PCV13) vaccine. You may need this if you have certain conditions and were not previously vaccinated.  Pneumococcal polysaccharide (PPSV23) vaccine. You may need one or two doses if you smoke cigarettes or if you have certain conditions. Talk to your health care provider about which screenings and vaccines you need and how often you need them. This information is not intended to replace advice given to you by your health care provider. Make sure you discuss any questions you have with your health care provider. Document Released: 02/17/2015 Document Revised: 10/11/2015 Document Reviewed: 11/22/2014 Elsevier Interactive Patient Education  2017 [place] Prevention in the Home Falls can cause injuries. They can happen to people of all ages. There are many things you can do to make your home safe and to help prevent falls. What can I do on the outside of my home?  Regularly fix the edges of walkways and driveways and fix any cracks.  Remove anything that might make you trip as you walk through a door, such as a raised step or threshold.  Trim any bushes or trees on the path to your home.  Use bright outdoor lighting.  Clear any walking paths of anything that might make someone trip, such as rocks or tools.  Regularly check to see if handrails are loose or broken. Make sure that both sides of any steps have handrails.  Any raised decks and porches should have  guardrails on the edges.  Have any leaves, snow, or ice cleared regularly.  Use sand or salt on walking paths during winter.  Clean up any spills in your garage right away. This includes oil or grease spills. What can I do in the bathroom?  Use night lights.  Install grab bars by the toilet and in the tub and shower. Do not use towel bars as grab bars.  Use non-skid mats or decals in the tub or shower.  If you need to sit down in the shower, use a plastic, non-slip stool.  Keep the floor dry. Clean up any water that spills on the floor as soon as it happens.  Remove soap buildup in the tub or shower regularly.  Attach bath mats securely with double-sided non-slip rug tape.  Do not have throw rugs and other things on the floor that can make you trip. What can I do in the bedroom?  Use night lights.  Make sure that you have a light by your bed that is easy to reach.  Do not use any sheets or blankets that are too big for your bed. They should not hang down onto the floor.  Have a firm chair that has side arms. You can  use this for support while you get dressed.  Do not have throw rugs and other things on the floor that can make you trip. What can I do in the kitchen?  Clean up any spills right away.  Avoid walking on wet floors.  Keep items that you use a lot in easy-to-reach places.  If you need to reach something above you, use a strong step stool that has a grab bar.  Keep electrical cords out of the way.  Do not use floor polish or wax that makes floors slippery. If you must use wax, use non-skid floor wax.  Do not have throw rugs and other things on the floor that can make you trip. What can I do with my stairs?  Do not leave any items on the stairs.  Make sure that there are handrails on both sides of the stairs and use them. Fix handrails that are broken or loose. Make sure that handrails are as long as the stairways.  Check any carpeting to make sure that  it is firmly attached to the stairs. Fix any carpet that is loose or worn.  Avoid having throw rugs at the top or bottom of the stairs. If you do have throw rugs, attach them to the floor with carpet tape.  Make sure that you have a light switch at the top of the stairs and the bottom of the stairs. If you do not have them, ask someone to add them for you. What else can I do to help prevent falls?  Wear shoes that:  Do not have high heels.  Have rubber bottoms.  Are comfortable and fit you well.  Are closed at the toe. Do not wear sandals.  If you use a stepladder:  Make sure that it is fully opened. Do not climb a closed stepladder.  Make sure that both sides of the stepladder are locked into place.  Ask someone to hold it for you, if possible.  Clearly mark and make sure that you can see:  Any grab bars or handrails.  First and last steps.  Where the edge of each step is.  Use tools that help you move around (mobility aids) if they are needed. These include:  Canes.  Walkers.  Scooters.  Crutches.  Turn on the lights when you go into a dark area. Replace any light bulbs as soon as they burn out.  Set up your furniture so you have a clear path. Avoid moving your furniture around.  If any of your floors are uneven, fix them.  If there are any pets around you, be aware of where they are.  Review your medicines with your doctor. Some medicines can make you feel dizzy. This can increase your chance of falling. Ask your doctor what other things that you can do to help prevent falls. This information is not intended to replace advice given to you by your health care provider. Make sure you discuss any questions you have with your health care provider. Document Released: 11/17/2008 Document Revised: 06/29/2015 Document Reviewed: 02/25/2014 Elsevier Interactive Patient Education  2017 [employer].

## 2018-08-19 NOTE — Progress Notes (Signed)
Subjective:   ISSABELLA RIX is a 48 y.o. female who presents for Medicare Annual (Subsequent) preventive examination.  This visit is being conducted via phone call  - after an attmept to do on video chat - due to the COVID-19 pandemic. This patient has given me verbal consent via phone to conduct this visit, patient states they are participating from their home address. Some vital signs may be absent or patient reported.   Patient identification: identified by name, DOB, and current address.     Review of Systems:  Cardiac Risk Factors include: advanced age (>36mn, >>84women);diabetes mellitus;dyslipidemia;hypertension     Objective:     Vitals: There were no vitals taken for this visit.  There is no height or weight on file to calculate BMI.  Advanced Directives 08/19/2018 08/13/2017 08/08/2016 12/16/2015 11/20/2015 08/02/2015  Does Patient Have a Medical Advance Directive? No No No No No No  Would patient like information on creating a medical advance directive? - Yes (MAU/Ambulatory/Procedural Areas - Information given) Yes (MAU/Ambulatory/Procedural Areas - Information given) No - patient declined information - -    Tobacco Social History   Tobacco Use  Smoking Status Never Smoker  Smokeless Tobacco Never Used     Counseling given: Not Answered   Clinical Intake:  Pre-visit preparation completed: Yes  Pain : No/denies pain     Nutritional Risks: None Diabetes: Yes CBG done?: No Did pt. bring in CBG monitor from home?: No  How often do you need to have someone help you when you read instructions, pamphlets, or other written materials from your doctor or pharmacy?: 1 - Never What is the last grade level you completed in school?: ged  Nutrition Risk Assessment:  Has the patient had any N/V/D within the last 2 months?  No  Does the patient have any non-healing wounds?  No  Has the patient had any unintentional weight loss or weight gain?  No   Diabetes:  Is  the patient diabetic?  Yes  If diabetic, was a CBG obtained today?  No  Did the patient bring in their glucometer from home?  No  How often do you monitor your CBG's? 2-3 days .   Financial Strains and Diabetes Management:  Are you having any financial strains with the device, your supplies or your medication? No .  Does the patient want to be seen by Chronic Care Management for management of their diabetes?  No  Would the patient like to be referred to a Nutritionist or for Diabetic Management?  No   Diabetic Exams:  Diabetic Eye Exam: patient will schedule an eye appt with [facility]   Diabetic Foot Exam: Pt is scheduled for diabetic foot exam on 08/27/2018    Interpreter Needed?: No  Information entered by :: Tiffany Hill,LPN  Past Medical History:  Diagnosis Date  . [place]    a. 02/2012 Echo: EF 25-35%, glob HK; b. 06/2016 Echo: EF 55-60%, mild MR, nl PASP.  .Marland KitchenCKD (chronic kidney disease), stage II   . [place]   . Extreme obesity   . Hyperlipidemia   . Hypertension   . Hypoxia   . Microcytic anemia   . Sleep apnea   . Vaginal bleeding    a. 01/2017 s/p hyteroscopy and polypectomy ([facility].  . Vitamin D deficiency    Past Surgical History:  Procedure Laterality Date  . CESAREAN SECTION    . CHOLECYSTECTOMY    .  DILATION AND CURETTAGE, DIAGNOSTIC / THERAPEUTIC    . TONSILLECTOMY AND ADENOIDECTOMY    . TOOTH EXTRACTION  01/2016   Family History  Problem Relation Age of Onset  . Diabetes Mother   . Hyperlipidemia Mother   . Hypertension Mother   . Stroke Mother   . Arthritis Father   . Asthma Father   . Diabetes Father   . Hypertension Father   . Hypertension Brother   . Diabetes Brother   . Diabetes Maternal Grandmother   . Hypertension Maternal Grandmother   . Diabetes Maternal Grandfather   . Hypertension Maternal Grandfather   . Diabetes Paternal Grandmother   .  Hypertension Paternal Grandmother   . Heart attack Maternal Uncle    Social History   Socioeconomic History  . Marital status: Single    Spouse name: Not on file  . Number of children: Not on file  . Years of education: GED  . Highest education level: GED or equivalent  Occupational History  . Occupation: disability  Social Needs  . Financial resource strain: Somewhat hard  . Food insecurity    Worry: Never true    Inability: Never true  . Transportation needs    Medical: No    Non-medical: No  Tobacco Use  . Smoking status: Never Smoker  . Smokeless tobacco: Never Used  Substance and Sexual Activity  . Alcohol use: Yes    Alcohol/week: 0.0 standard drinks    Comment: 1-2 beers/month  . Drug use: No  . Sexual activity: Yes    Birth control/protection: None  Lifestyle  . Physical activity    Days per week: 0 days    Minutes per session: 0 min  . Stress: Not at all  Relationships  . Social connections    Talks on phone: More than three times a week    Gets together: More than three times a week    Attends religious service: 1 to 4 times per year    Active member of club or organization: No    Attends meetings of clubs or organizations: Never    Relationship status: Never married  Other Topics Concern  . Not on file  Social History Narrative  . Not on file    Outpatient Encounter Medications as of 08/19/2018  Medication Sig  . atenolol (TENORMIN) 50 MG tablet Take 1 tablet (50 mg total) by mouth daily.  Marland Kitchen atorvastatin (LIPITOR) 10 MG tablet TAKE 1 TABLET BY MOUTH EVERY DAY  . blood glucose meter kit and supplies KIT Dispense based on patient and insurance preference. Use up to four times daily as directed. (FOR ICD-9 250.00, 250.01).  Marland Kitchen Exenatide ER (BYDUREON BCISE) 2 MG/0.85ML AUIJ Inject 2 mg into the skin once a week.  . ferrous sulfate 325 (65 FE) MG tablet Take 1 tablet (325 mg total) by mouth daily with breakfast.  . furosemide (LASIX) 40 MG tablet Take 1  tablet (40 mg total) by mouth 2 (two) times daily.  Marland Kitchen glipiZIDE (GLUCOTROL) 10 MG tablet TAKE 1 TABLET BY MOUTH TWICE A DAY WITH MEALS  . glucose blood (CONTOUR NEXT TEST) test strip TEST BLOOD SUGAR ONCE DAILY  . Insulin Glargine (TOUJEO SOLOSTAR) 300 UNIT/ML SOPN Inject 54 Units into the skin at bedtime.  . Insulin Pen Needle 32G X 4 MM MISC 1 Units by Does not apply route every morning. Pen needles  . levonorgestrel (MIRENA) 20 MCG/24HR IUD 1 each by Intrauterine route once.  Marland Kitchen lisinopril (PRINIVIL,ZESTRIL) 40 MG tablet  TAKE 1 TABLET BY MOUTH ONCE DAILY  . OXYGEN Inhale 2 L into the lungs daily.   No facility-administered encounter medications on file as of 08/19/2018.     Activities of Daily Living In your present state of health, do you have any difficulty performing the following activities: 08/19/2018  Hearing? N  Vision? N  Difficulty concentrating or making decisions? N  Walking or climbing stairs? N  Dressing or bathing? N  Doing errands, shopping? N  Preparing Food and eating ? N  Using the Toilet? N  In the past six months, have you accidently leaked urine? N  Do you have problems with loss of bowel control? N  Managing your Medications? N  Managing your Finances? N  Housekeeping or managing your Housekeeping? N  Some recent data might be hidden    Patient Care Team: Venita Lick, NP as PCP - General (Nurse Practitioner) End, Harrell Gave, MD as PCP - Cardiology (Cardiology) Sharlotte Alamo, DPM (Podiatry) Defrancesco, Alanda Slim, MD as Consulting Physician (Obstetrics and Gynecology) Leandrew Koyanagi, MD as Referring Physician (Ophthalmology)    Assessment:   This is a routine wellness examination for [place].  Exercise Activities and Dietary recommendations Current Exercise Habits: Home exercise routine, Type of exercise: walking, Time (Minutes): 30, Frequency (Times/Week): 3, Weekly Exercise (Minutes/Week): 90, Intensity: Mild, Exercise limited by: None identified   Goals    . Increase water intake     Recommend drinking at least 4-5 glasses of water a day       Fall Risk: Fall Risk  08/19/2018 08/13/2017 08/08/2016 08/02/2015  Falls in the past year? 0 No No No    FALL RISK PREVENTION PERTAINING TO THE HOME:  Any stairs in or around the home? No  If so, are there any without handrails? No   Home free of loose throw rugs in walkways, pet beds, electrical cords, etc? Yes  Adequate lighting in your home to reduce risk of falls? Yes   ASSISTIVE DEVICES UTILIZED TO PREVENT FALLS:  Life alert? No  Use of a cane, walker or w/c? No  Grab bars in the bathroom? No  Shower chair or bench in shower? No  Elevated toilet seat or a handicapped toilet? No   TIMED UP AND GO:  Unable to perform    Depression Screen PHQ 2/9 Scores 08/19/2018 08/13/2017 08/08/2016 08/02/2015  PHQ - 2 Score 0 _0 PHQ- 9 Score - 5 7 -     Cognitive Function     6CIT Screen 08/13/2017 08/08/2016  What Year? 0 points 0 points  What month? 0 points 0 points  What time? 0 points 0 points  Count back from 20 0 points 0 points  Months in reverse 0 points 0 points  Repeat phrase 0 points 0 points  Total Score 0 0    Immunization History  Administered Date(s) Administered  . Pneumococcal Polysaccharide-23 01/12/2007, 04/18/2009  . Tdap 07/07/2007    Qualifies for Shingles Vaccine? No    Tdap: .Discussed need for TD/TDAP vaccine, patient verbalized understanding that this is not covered as a preventative with there insurance and to call the office if she develops any new skin injuries, ie: cuts, scrapes, bug bites, or open wounds.  Flu Vaccine: due 10/2018  Pneumococcal Vaccine: up to date   Screening Tests Health Maintenance  Topic Date Due  . FOOT EXAM  08/21/2017  . OPHTHALMOLOGY EXAM  07/11/2018  . TETANUS/TDAP  08/25/2018 (Originally 07/06/2017)  . HEMOGLOBIN A1C  08/25/2018  . INFLUENZA VACCINE  09/05/2018  . PAP SMEAR-Modifier  01/21/2020  .  PNEUMOCOCCAL POLYSACCHARIDE VACCINE AGE 5-64 HIGH RISK  Completed  . HIV Screening  Addressed    Cancer Screenings:  Colorectal Screening: not indicated   Mammogram: ordered, pat to schedule   Bone Density: no longer required   Lung Cancer Screening: (Low Dose CT Chest recommended if Age 50-80 years, 30 pack-year currently smoking OR have quit w/in 15years.) does not qualify.     Additional Screening:  Hepatitis C Screening: does not qualify  Dental Screening: Recommended annual dental exams for proper oral hygiene   Community Resource Referral:  CRR required this visit?  No       Plan:  I have personally reviewed and addressed the Medicare Annual Wellness questionnaire and have noted the following in the patient's chart:  A. Medical and social history B. Use of alcohol, tobacco or illicit drugs  C. Current medications and supplements D. Functional ability and status E.  Nutritional status F.  Physical activity G. Advance directives H. List of other physicians I.  Hospitalizations, surgeries, and ER visits in previous 12 months J.  [place] such as hearing and vision if needed, cognitive and depression L. Referrals and appointments   In addition, I have reviewed and discussed with patient certain preventive protocols, quality metrics, and best practice recommendations. A written personalized care plan for preventive services as well as general preventive health recommendations were provided to patient.   Signed,    Bevelyn Ngo, LPN  0/72/1828 Nurse Health Advisor   Nurse Notes: none

## 2018-08-24 DIAGNOSIS — E785 Hyperlipidemia, unspecified: Secondary | ICD-10-CM | POA: Diagnosis not present

## 2018-08-24 DIAGNOSIS — E1165 Type 2 diabetes mellitus with hyperglycemia: Secondary | ICD-10-CM | POA: Diagnosis not present

## 2018-08-24 DIAGNOSIS — Z794 Long term (current) use of insulin: Secondary | ICD-10-CM | POA: Diagnosis not present

## 2018-08-24 DIAGNOSIS — E1169 Type 2 diabetes mellitus with other specified complication: Secondary | ICD-10-CM | POA: Diagnosis not present

## 2018-08-24 DIAGNOSIS — E559 Vitamin D deficiency, unspecified: Secondary | ICD-10-CM | POA: Diagnosis not present

## 2018-08-27 ENCOUNTER — Encounter: Payer: Self-pay | Admitting: Nurse Practitioner

## 2018-08-27 ENCOUNTER — Ambulatory Visit (INDEPENDENT_AMBULATORY_CARE_PROVIDER_SITE_OTHER): Payer: Medicare Other | Admitting: Nurse Practitioner

## 2018-08-27 ENCOUNTER — Other Ambulatory Visit: Payer: Self-pay

## 2018-08-27 VITALS — BP 135/88 | HR 83 | Temp 99.0°F | Ht 69.0 in | Wt 387.0 lb

## 2018-08-27 DIAGNOSIS — E119 Type 2 diabetes mellitus without complications: Secondary | ICD-10-CM | POA: Diagnosis not present

## 2018-08-27 DIAGNOSIS — N182 Chronic kidney disease, stage 2 (mild): Secondary | ICD-10-CM

## 2018-08-27 DIAGNOSIS — E1169 Type 2 diabetes mellitus with other specified complication: Secondary | ICD-10-CM | POA: Diagnosis not present

## 2018-08-27 DIAGNOSIS — E785 Hyperlipidemia, unspecified: Secondary | ICD-10-CM

## 2018-08-27 DIAGNOSIS — I130 Hypertensive heart and chronic kidney disease with heart failure and stage 1 through stage 4 chronic kidney disease, or unspecified chronic kidney disease: Secondary | ICD-10-CM

## 2018-08-27 DIAGNOSIS — IMO0001 Reserved for inherently not codable concepts without codable children: Secondary | ICD-10-CM

## 2018-08-27 DIAGNOSIS — Z794 Long term (current) use of insulin: Secondary | ICD-10-CM | POA: Diagnosis not present

## 2018-08-27 LAB — LIPID PANEL PICCOLO, WAIVED
Chol/HDL Ratio Piccolo,Waive: 2.6 mg/dL
Cholesterol Piccolo, Waived: 113 mg/dL (ref <200)
HDL Chol Piccolo, Waived: 44 mg/dL — ABNORMAL LOW (ref >59)
LDL Chol Calc Piccolo Waived: 55 mg/dL (ref <100)
Triglycerides Piccolo,Waived: 69 mg/dL (ref <150)
VLDL Chol Calc Piccolo,Waive: 14 mg/dL (ref <30)

## 2018-08-27 LAB — BAYER DCA HB A1C WAIVED: HB A1C (BAYER DCA - WAIVED): 7.1 % — ABNORMAL HIGH (ref <7.0)

## 2018-08-27 NOTE — Assessment & Plan Note (Signed)
Chronic, ongoing with LDL 55 and TCHOL 113.  Continue current medication regimen as at goal.

## 2018-08-27 NOTE — Patient Instructions (Signed)
Carbohydrate Counting for Diabetes Mellitus, Adult  Carbohydrate counting is a method of keeping track of how many carbohydrates you eat. Eating carbohydrates naturally increases the amount of sugar (glucose) in the blood. Counting how many carbohydrates you eat helps keep your blood glucose within normal limits, which helps you manage your diabetes (diabetes mellitus). It is important to know how many carbohydrates you can safely have in each meal. This is different for every person. A diet and nutrition specialist (registered dietitian) can help you make a meal plan and calculate how many carbohydrates you should have at each meal and snack. Carbohydrates are found in the following foods:  Grains, such as breads and cereals.  Dried beans and soy products.  Starchy vegetables, such as potatoes, peas, and corn.  Fruit and fruit juices.  Milk and yogurt.  Sweets and snack foods, such as cake, cookies, candy, chips, and soft drinks. How do I count carbohydrates? There are two ways to count carbohydrates in food. You can use either of the methods or a combination of both. Reading "Nutrition Facts" on packaged food The "Nutrition Facts" list is included on the labels of almost all packaged foods and beverages in the [place] It includes:  The serving size.  Information about nutrients in each serving, including the grams (g) of carbohydrate per serving. To use the "Nutrition Facts":  Decide how many servings you will have.  Multiply the number of servings by the number of carbohydrates per serving.  The resulting number is the total amount of carbohydrates that you will be having. Learning standard serving sizes of other foods When you eat carbohydrate foods that are not packaged or do not include "Nutrition Facts" on the label, you need to measure the servings in order to count the amount of carbohydrates:  Measure the foods that you will eat with a food scale or measuring cup, if needed.   Decide how many standard-size servings you will eat.  Multiply the number of servings by 15. Most carbohydrate-rich foods have about 15 g of carbohydrates per serving. ? For example, if you eat 8 oz (170 g) of strawberries, you will have eaten 2 servings and 30 g of carbohydrates (2 servings x 15 g = 30 g).  For foods that have more than one food mixed, such as soups and casseroles, you must count the carbohydrates in each food that is included. The following list contains standard serving sizes of common carbohydrate-rich foods. Each of these servings has about 15 g of carbohydrates:   hamburger bun or  English muffin.   oz (15 mL) syrup.   oz (14 g) jelly.  1 slice of bread.  1 six-inch tortilla.  3 oz (85 g) cooked rice or pasta.  4 oz (113 g) cooked dried beans.  4 oz (113 g) starchy vegetable, such as peas, corn, or potatoes.  4 oz (113 g) hot cereal.  4 oz (113 g) mashed potatoes or  of a large baked potato.  4 oz (113 g) canned or frozen fruit.  4 oz (120 mL) fruit juice.  4-6 crackers.  6 chicken nuggets.  6 oz (170 g) unsweetened dry cereal.  6 oz (170 g) plain fat-free yogurt or yogurt sweetened with artificial sweeteners.  8 oz (240 mL) milk.  8 oz (170 g) fresh fruit or one small piece of fruit.  24 oz (680 g) popped popcorn. Example of carbohydrate counting Sample meal  3 oz (85 g) chicken breast.  6 oz (170 g)   brown rice.  4 oz (113 g) corn.  8 oz (240 mL) milk.  8 oz (170 g) strawberries with sugar-free whipped topping. Carbohydrate calculation 1. Identify the foods that contain carbohydrates: ? Rice. ? Corn. ? Milk. ? Strawberries. 2. Calculate how many servings you have of each food: ? 2 servings rice. ? 1 serving corn. ? 1 serving milk. ? 1 serving strawberries. 3. Multiply each number of servings by 15 g: ? 2 servings rice x 15 g = 30 g. ? 1 serving corn x 15 g = 15 g. ? 1 serving milk x 15 g = 15 g. ? 1 serving  strawberries x 15 g = 15 g. 4. Add together all of the amounts to find the total grams of carbohydrates eaten: ? 30 g + 15 g + 15 g + 15 g = 75 g of carbohydrates total. Summary  Carbohydrate counting is a method of keeping track of how many carbohydrates you eat.  Eating carbohydrates naturally increases the amount of sugar (glucose) in the blood.  Counting how many carbohydrates you eat helps keep your blood glucose within normal limits, which helps you manage your diabetes.  A diet and nutrition specialist (registered dietitian) can help you make a meal plan and calculate how many carbohydrates you should have at each meal and snack. This information is not intended to replace advice given to you by your health care provider. Make sure you discuss any questions you have with your health care provider. Document Released: 01/21/2005 Document Revised: 08/15/2016 Document Reviewed: 07/05/2015 Elsevier Patient Education  2020 [employer]  

## 2018-08-27 NOTE — Assessment & Plan Note (Signed)
Chronic, ongoing with BP at goal.  Continue current medication regimen and collaboration with cardiology.  Recommend checking BP three mornings a week at home + documenting for providers.

## 2018-08-27 NOTE — Assessment & Plan Note (Addendum)
Chronic, ongoing.  Followed by endo.  A1C today 7.1%.  Continue current medication regimen, as ordered by endocrinology.  Continue collaboration with endocrinology.  Praised for success on continued downward trend of A1C.  Return in 3 months.

## 2018-08-27 NOTE — Assessment & Plan Note (Signed)
Recommend continued focus on health diet choices and regular physical activity (30 minutes 5 days a week). 

## 2018-08-27 NOTE — Progress Notes (Signed)
BP 135/88   Pulse 83   Temp 99 F (37.2 C) (Oral)   Ht 5\' 9"  (1.753 m)   Wt (!) 387 lb (175.5 kg)   SpO2 94%   BMI 57.15 kg/m    Subjective:    Patient ID: Sara Zhang, female    DOB: 1970-07-23, 48 y.o.   MRN: [id number]  HPI: Sara Zhang is a 48 y.o. female  Chief Complaint  Patient presents with  . Diabetes    48m f/u  . Hypertension  . Hyperlipidemia    . This visit was completed via WebEx due to the restrictions of the COVID-19 pandemic. All issues as above were discussed and addressed. Physical exam was done as above through visual confirmation on WebEx. If it was felt that the patient should be evaluated in the office, they were directed there. The patient verbally consented to this visit. . Location of the patient: home . Location of the provider: home . Those involved with this call:  . Provider: Aura Dials, DNP . CMA: Elton Sin, CMA . Front Desk/Registration: Harriet Pho  . Time spent on call: 15 minutes with patient face to face via video conference. More than 50% of this time was spent in counseling and coordination of care. 10 minutes total spent in review of patient's record and preparation of their chart.  . I verified patient identity using two factors (patient name and date of birth). Patient consents verbally to being seen via telemedicine visit today.    DIABETES Current medications include Glipizide 5 MG BID, Toujeo 56 units QHS, and Bydureon 2 MG weekly.  Her previous A1C in January was trending downwards to 7.9%.  She is followed by endocrinology and her last visit with them was 07/22/2018 where no changes were made, last A1C on [facility] review was 8.9% 05/28/2018. She sees them Monday again to discuss labs and medication changes needed.  Hypoglycemic episodes:no Polydipsia/polyuria: no Visual disturbance: no Chest pain: no Paresthesias: no Glucose Monitoring: yes  Accucheck frequency: BID  Fasting glucose: 90-120, sometimes 188 --- 91  this morning  Post prandial:  Evening: 121-221  Before meals: Taking Insulin?: yes  Long acting insulin: 56 units  Short acting insulin: Blood Pressure Monitoring: not checking Retinal Examination: Not up to Date Foot Exam: Not up to Date Pneumovax: Up to Date Influenza: Up to Date Aspirin: no   HYPERTENSION / HYPERLIPIDEMIA Current medications include Lisinopril, Furosemide, Atenolol for HTN + Lipitor for HLD.  Followed by cardiology and last saw Dr. Okey Dupre 09/17/17, she reports she sees them in upcoming month. Satisfied with current treatment? yes Duration of hypertension: chronic BP monitoring frequency: not checking BP range:  BP medication side effects: no Duration of hyperlipidemia: chronic Cholesterol medication side effects: no Cholesterol supplements: none Medication compliance: good compliance Aspirin: no Recent stressors: no Recurrent headaches: no Visual changes: no Palpitations: no Dyspnea: no Chest pain: no Lower extremity edema: no Dizzy/lightheaded: no  Relevant past medical, surgical, family and social history reviewed and updated as indicated. Interim medical history since our last visit reviewed. Allergies and medications reviewed and updated.  Review of Systems  Constitutional: Negative for activity change, appetite change, diaphoresis, fatigue and fever.  Respiratory: Negative for cough, chest tightness, shortness of breath and wheezing.   Cardiovascular: Negative for chest pain, palpitations and leg swelling.  Gastrointestinal: Negative for abdominal distention, abdominal pain, constipation, diarrhea, nausea and vomiting.  Endocrine: Negative for cold intolerance, heat intolerance, polydipsia, polyphagia and polyuria.  Neurological: Negative  for dizziness, syncope, weakness, light-headedness, numbness and headaches.  Psychiatric/Behavioral: Negative.     Per HPI unless specifically indicated above     Objective:    BP 135/88   Pulse 83   Temp  99 F (37.2 C) (Oral)   Ht 5\' 9"  (1.753 m)   Wt (!) 387 lb (175.5 kg)   SpO2 94%   BMI 57.15 kg/m   Wt Readings from Last 3 Encounters:  08/27/18 (!) 387 lb (175.5 kg)  02/24/18 (!) 386 lb 6 oz (175.3 kg)  11/18/17 (!) 378 lb 8 oz (171.7 kg)    Physical Exam Vitals signs and nursing note reviewed.  Constitutional:      General: She is awake. She is not in acute distress.    Appearance: She is well-developed. She is not ill-appearing.  HENT:     Head: Normocephalic.     Right Ear: Hearing normal.     Left Ear: Hearing normal.  Eyes:     General: Lids are normal.        Right eye: No discharge.        Left eye: No discharge.     Conjunctiva/sclera: Conjunctivae normal.  Neck:     Musculoskeletal: Normal range of motion.  Cardiovascular:     Comments: Unable to auscultate due to virtual exam only  Pulmonary:     Effort: Pulmonary effort is normal. No accessory muscle usage or respiratory distress.     Comments: Unable to auscultate due to virtual exam only  Neurological:     Mental Status: She is alert and oriented to person, place, and time.  Psychiatric:        Attention and Perception: Attention normal.        Mood and Affect: Mood normal.        Behavior: Behavior normal. Behavior is cooperative.        Thought Content: Thought content normal.        Judgment: Judgment normal.     Results for orders placed or performed in visit on 08/27/18  Lipid Panel Piccolo, Arrow ElectronicsWaived  Result Value Ref Range   Cholesterol Piccolo, Waived 113 <200 mg/dL   HDL Chol Piccolo, Waived 44 (L) >59 mg/dL   Triglycerides Piccolo,Waived 69 <150 mg/dL   Chol/HDL Ratio Piccolo,Waive 2.6 mg/dL   LDL Chol Calc Piccolo Waived 55 <100 mg/dL   VLDL Chol Calc Piccolo,Waive 14 <30 mg/dL  Bayer DCA Hb [id number] Waived  Result Value Ref Range   HB A1C (BAYER DCA - WAIVED) 7.1 (H) <7.0 %      Assessment & Plan:   Problem List Items Addressed This Visit      Cardiovascular and Mediastinum   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  Continue current medication regimen and collaboration with cardiology.  Recommend checking BP three mornings a week at home + documenting for providers.          Endocrine   Hyperlipidemia associated with type 2 diabetes mellitus (HCC)    Chronic, ongoing with LDL 55 and TCHOL 113.  Continue current medication regimen as at goal.        Relevant Orders   Lipid Panel Piccolo, Waived (Completed)   Insulin dependent diabetes mellitus (HCC) - Primary    Chronic, ongoing.  Followed by endo.  A1C today 7.1%.  Continue current medication regimen, as ordered by endocrinology.  Continue collaboration  with endocrinology.  Praised for success on continued downward trend of A1C.  Return in 3 months.         Other   Morbid obesity ([place])    Recommend continued focus on health diet choices and regular physical activity (30 minutes 5 days a week).          I discussed the assessment and treatment plan with the patient. The patient was provided an opportunity to ask questions and all were answered. The patient agreed with the plan and demonstrated an understanding of the instructions.   The patient was advised to call back or seek an in-person evaluation if the symptoms worsen or if the condition fails to improve as anticipated.   I provided 15 minutes of time during this encounter.  Follow up plan: Return in about 3 months (around 11/27/2018) for T2DM, HTN/HLD.

## 2018-08-28 LAB — COMPREHENSIVE METABOLIC PANEL
ALT: 12 IU/L (ref 0–32)
AST: 14 IU/L (ref 0–40)
Albumin/Globulin Ratio: 1.2 (ref 1.2–2.2)
Albumin: 3.6 g/dL — ABNORMAL LOW (ref 3.8–4.8)
Alkaline Phosphatase: 105 IU/L (ref 39–117)
BUN/Creatinine Ratio: 11 (ref 9–23)
BUN: 14 mg/dL (ref 6–24)
Bilirubin Total: 0.2 mg/dL (ref 0.0–1.2)
CO2: 23 mmol/L (ref 20–29)
Calcium: 7.6 mg/dL — ABNORMAL LOW (ref 8.7–10.2)
Chloride: 97 mmol/L (ref 96–106)
Creatinine, Ser: 1.29 mg/dL — ABNORMAL HIGH (ref 0.57–1.00)
GFR calc Af Amer: 56 mL/min/{1.73_m2} — ABNORMAL LOW (ref >59)
GFR calc non Af Amer: 49 mL/min/{1.73_m2} — ABNORMAL LOW (ref >59)
Globulin, Total: 3.1 g/dL (ref 1.5–4.5)
Glucose: 144 mg/dL — ABNORMAL HIGH (ref 65–99)
Potassium: 4.7 mmol/L (ref 3.5–5.2)
Sodium: 141 mmol/L (ref 134–144)
Total Protein: 6.7 g/dL (ref 6.0–8.5)

## 2018-08-29 ENCOUNTER — Other Ambulatory Visit: Payer: Self-pay | Admitting: Internal Medicine

## 2018-08-31 DIAGNOSIS — E559 Vitamin D deficiency, unspecified: Secondary | ICD-10-CM | POA: Diagnosis not present

## 2018-08-31 DIAGNOSIS — M1712 Unilateral primary osteoarthritis, left knee: Secondary | ICD-10-CM | POA: Diagnosis not present

## 2018-08-31 DIAGNOSIS — E785 Hyperlipidemia, unspecified: Secondary | ICD-10-CM | POA: Diagnosis not present

## 2018-08-31 DIAGNOSIS — E1129 Type 2 diabetes mellitus with other diabetic kidney complication: Secondary | ICD-10-CM | POA: Diagnosis not present

## 2018-08-31 DIAGNOSIS — E1165 Type 2 diabetes mellitus with hyperglycemia: Secondary | ICD-10-CM | POA: Diagnosis not present

## 2018-08-31 DIAGNOSIS — I10 Essential (primary) hypertension: Secondary | ICD-10-CM | POA: Diagnosis not present

## 2018-08-31 DIAGNOSIS — Z6841 Body Mass Index (BMI) 40.0 and over, adult: Secondary | ICD-10-CM | POA: Diagnosis not present

## 2018-08-31 DIAGNOSIS — E1169 Type 2 diabetes mellitus with other specified complication: Secondary | ICD-10-CM | POA: Diagnosis not present

## 2018-08-31 DIAGNOSIS — E1159 Type 2 diabetes mellitus with other circulatory complications: Secondary | ICD-10-CM | POA: Diagnosis not present

## 2018-08-31 DIAGNOSIS — R809 Proteinuria, unspecified: Secondary | ICD-10-CM | POA: Diagnosis not present

## 2018-08-31 DIAGNOSIS — Z794 Long term (current) use of insulin: Secondary | ICD-10-CM | POA: Diagnosis not present

## 2018-09-07 ENCOUNTER — Other Ambulatory Visit: Payer: Self-pay

## 2018-09-07 MED ORDER — FUROSEMIDE 40 MG PO TABS: 40.0000 mg | ORAL_TABLET | Freq: Two times a day (BID) | ORAL | 0 refills | Status: DC

## 2018-10-22 ENCOUNTER — Encounter: Payer: Self-pay | Admitting: Internal Medicine

## 2018-10-22 ENCOUNTER — Other Ambulatory Visit: Payer: Self-pay

## 2018-10-22 ENCOUNTER — Ambulatory Visit (INDEPENDENT_AMBULATORY_CARE_PROVIDER_SITE_OTHER): Payer: Medicare Other | Admitting: Internal Medicine

## 2018-10-22 VITALS — BP 138/88 | HR 84 | Ht 69.0 in | Wt 389.0 lb

## 2018-10-22 DIAGNOSIS — I5032 Chronic diastolic (congestive) heart failure: Secondary | ICD-10-CM

## 2018-10-22 DIAGNOSIS — I428 Other cardiomyopathies: Secondary | ICD-10-CM

## 2018-10-22 DIAGNOSIS — G4733 Obstructive sleep apnea (adult) (pediatric): Secondary | ICD-10-CM

## 2018-10-22 DIAGNOSIS — I10 Essential (primary) hypertension: Secondary | ICD-10-CM

## 2018-10-22 DIAGNOSIS — R600 Localized edema: Secondary | ICD-10-CM

## 2018-10-22 NOTE — Progress Notes (Signed)
Follow-up Outpatient Visit Date: 10/22/2018  Primary Care Provider: [place], [place] [address]  Chief Complaint: Leg swelling  HPI:  [name] is a 48 y.o. year-old female with history of heart failure (LVEF reportedly 25-35% in 2014 with subsequent normalization), HTN, HLD, OSA on CPAP, and type II DM, who presents for follow-up of heart failure.  I last saw her in 09/2017, at which time she reported intermittent cough and chest congestion.  She also noted occasional dependent leg edema but otherwise felt well.  BP was elevated at that visit, with OTC cold medication likely contributing.  I advised her to stop taking the cold medications and to monitor her BP at home.  Today, Sara Zhang reports that she has put on weight and has noticed increased leg swelling over the last few months.  The edema is dependent but is more pronounced than at our last visit.  She has stable two-pillow orthopnea without PND.  She has also noticed increasing exertional dyspnea over the last 6 months.  She notes that she has been relatively sedentary and is not taking her furosemide twice daily as prescribed on a regular basis due to urinary frequency.  She reports occasional brief "catches" in the chest but no significant chest pain.  She has not been lightheaded or dizzy.  She tries to walk up 17 stairs at least once a day for exercise, though she otherwise does not do any physical activity.  --------------------------------------------------------------------------------------------------  Cardiovascular History & Procedures: Cardiovascular Problems:  Chronic systolic heart failure with normalization of LVEF  Risk Factors:  Hypertension, hyperlipidemia, diabetes mellitus, and obesity  Cath/PCI:  None  CV Surgery:  None  EP Procedures and Devices:  None  Non-Invasive Evaluation(s):  TTE (06/10/16): Normal LV size and function with LVEF of 55-60%. Normal diastolic  parameters. Mild MR. Normal RV size and function. Normal pulmonary artery pressure.  Report of LVEF 25-35% with global hypokinesis by outside echo on her before 03/04/12.  Recent CV Pertinent Labs: Lab Results  Component Value Date   CHOL 113 08/27/2018   HDL 38 (L) 08/20/2017   LDLCALC 52 08/20/2017   TRIG 69 08/27/2018   BNP 51.0 12/16/2015   K 4.7 08/27/2018   K 4.2 09/25/2013   BUN 14 08/27/2018   BUN 12 09/25/2013   CREATININE 1.29 (H) 08/27/2018   CREATININE 0.75 09/25/2013    Past medical and surgical history were reviewed and updated in EPIC.  Current Meds  Medication Sig  . atenolol (TENORMIN) 50 MG tablet TAKE 1 TABLET BY MOUTH ONCE DAILY  . atorvastatin (LIPITOR) 10 MG tablet TAKE 1 TABLET BY MOUTH EVERY DAY  . blood glucose meter kit and supplies KIT Dispense based on patient and insurance preference. Use up to four times daily as directed. (FOR ICD-9 250.00, 250.01).  . Calcium Carbonate-Vitamin D (OYSTER SHELL CALCIUM 500 + D) 500-125 MG-UNIT TABS Take by mouth daily.  . Exenatide ER (BYDUREON BCISE) 2 MG/0.85ML AUIJ Inject 2 mg into the skin once a week.  . ferrous sulfate 325 (65 FE) MG tablet Take 1 tablet (325 mg total) by mouth daily with breakfast.  . furosemide (LASIX) 40 MG tablet Take 1 tablet (40 mg total) by mouth 2 (two) times daily.  Marland Kitchen glipiZIDE (GLUCOTROL) 10 MG tablet TAKE 1 TABLET BY MOUTH TWICE A DAY WITH MEALS  . glucose blood (CONTOUR NEXT TEST) test strip TEST BLOOD SUGAR ONCE DAILY  . Insulin Glargine (TOUJEO SOLOSTAR)  300 UNIT/ML SOPN Inject 54 Units into the skin at bedtime.  . Insulin Pen Needle 32G X 4 MM MISC 1 Units by Does not apply route every morning. Pen needles  . levonorgestrel (MIRENA) 20 MCG/24HR IUD 1 each by Intrauterine route once.  Marland Kitchen lisinopril (PRINIVIL,ZESTRIL) 40 MG tablet TAKE 1 TABLET BY MOUTH ONCE DAILY  . OXYGEN Inhale 2 L into the lungs daily.  Marland Kitchen VITAMIN D PO Take by mouth daily.    Allergies: Patient has no known  allergies.  Social History   Tobacco Use  . Smoking status: Never Smoker  . Smokeless tobacco: Never Used  Substance Use Topics  . Alcohol use: Yes    Alcohol/week: 0.0 standard drinks    Comment: 1-2 beers/month  . Drug use: No    Family History  Problem Relation Age of Onset  . Diabetes Mother   . Hyperlipidemia Mother   . Hypertension Mother   . Stroke Mother   . Arthritis Father   . Asthma Father   . Diabetes Father   . Hypertension Father   . Hypertension Brother   . Diabetes Brother   . Diabetes Maternal Grandmother   . Hypertension Maternal Grandmother   . Diabetes Maternal Grandfather   . Hypertension Maternal Grandfather   . Diabetes Paternal Grandmother   . Hypertension Paternal Grandmother   . Heart attack Maternal Uncle     Review of Systems: A 12-system review of systems was performed and was negative except as noted in the HPI.  --------------------------------------------------------------------------------------------------  Physical Exam: BP 138/88 (BP Location: Left Arm, Patient Position: Sitting, Cuff Size: Large)   Pulse 84   Ht _0  (1.753 m)   Wt (!) 389 lb (176.4 kg)   SpO2 96%   BMI 57.45 kg/m   General: Morbidly obese woman, seated comfortably in the exam room. HEENT: No conjunctival pallor or scleral icterus.  Facemask in place. Neck: Supple without lymphadenopathy or thyromegaly.  Unable to assess JVP due to body habitus. Lungs: Normal work of breathing. Clear to auscultation bilaterally without wheezes or crackles. Heart: Distant heart sounds.  Regular rate and rhythm without murmurs or rubs.  Unable to assess PMI due to body habitus. Abd: Bowel sounds present. Soft, NT/ND.  Unable to assess HSM due to body habitus. Ext: Trace pretibial edema bilaterally. Skin: Warm and dry without rash.  EKG: Normal sinus rhythm with first-degree AV block (PR interval 218 ms) and low voltage.  Lab Results  Component Value Date   WBC 8.9  08/20/2017   HGB 10.3 (L) 08/20/2017   HCT 34.8 08/20/2017   MCV 83 08/20/2017   PLT 382 08/20/2017    Lab Results  Component Value Date   NA 141 08/27/2018   K 4.7 08/27/2018   CL 97 08/27/2018   CO2 23 08/27/2018   BUN 14 08/27/2018   CREATININE 1.29 (H) 08/27/2018   GLUCOSE 144 (H) 08/27/2018   ALT 12 08/27/2018    Lab Results  Component Value Date   CHOL 113 08/27/2018   HDL 38 (L) 08/20/2017   LDLCALC 52 08/20/2017   TRIG 69 08/27/2018    --------------------------------------------------------------------------------------------------  ASSESSMENT AND PLAN: Chronic HFpEF and NICM: Sara Zhang reports some weight gain and leg edema as well as exertional dyspnea.  This is likely multifactorial but could be due to fluid retention in the setting of inconsistent furosemide use.  I have advised her to take furosemide 40 mg twice daily, as prescribed.  Given history of reduced LV  function in the past, we will obtain an echocardiogram.  In the meantime, she should continue current doses of atenolol and lisinopril.  Hypertension: Blood pressure borderline elevated.  I encouraged sodium restriction and continuation of current medications.  Obstructive sleep apnea: Continue CPAP use.  Morbid obesity: Encouraged weight loss through diet and exercise.  Follow-up: Return to clinic in 3 months.  Nelva Bush, MD 10/22/2018 10:09 AM

## 2018-10-22 NOTE — Patient Instructions (Addendum)
Medication Instructions:  - Your physician has recommended you make the following change in your medication:   1) Increase lasix (furosemide) 40 mg- take 1 tablet by mouth twice daily  If you need a refill on your cardiac medications before your next appointment, please call your pharmacy.   Lab work: - none ordered  If you have labs (blood work) drawn today and your tests are completely normal, you will receive your results only by: Marland Kitchen MyChart Message (if you have MyChart) OR . A paper copy in the mail If you have any lab test that is abnormal or we need to change your treatment, we will call you to review the results.  Testing/Procedures: - Your physician has requested that you have an echocardiogram. Echocardiography is a painless test that uses sound waves to create images of your heart. It provides your doctor with information about the size and shape of your heart and how well your heart's chambers and valves are working. This procedure takes approximately one hour. There are no restrictions for this procedure.  Follow-Up: At [facility], you and your health needs are our priority.  As part of our continuing mission to provide you with exceptional heart care, we have created designated Provider Care Teams.  These Care Teams include your primary Cardiologist (physician) and Advanced Practice Providers (APPs -  Physician Assistants and Nurse Practitioners) who all work together to provide you with the care you need, when you need it. . in 3 months with Dr. Saunders Revel  Any Other Special Instructions Will Be Listed Below (If Applicable). - N/A   Echocardiogram An echocardiogram is a procedure that uses painless sound waves (ultrasound) to produce an image of the heart. Images from an echocardiogram can provide important information about:  Signs of coronary artery disease (CAD).  Aneurysm detection. An aneurysm is a weak or damaged part of an artery wall that bulges out from the normal  force of blood pumping through the body.  Heart size and shape. Changes in the size or shape of the heart can be associated with certain conditions, including heart failure, aneurysm, and CAD.  Heart muscle function.  Heart valve function.  Signs of a past heart attack.  Fluid buildup around the heart.  Thickening of the heart muscle.  A tumor or infectious growth around the heart valves. Tell a health care provider about:  Any allergies you have.  All medicines you are taking, including vitamins, herbs, eye drops, creams, and over-the-counter medicines.  Any blood disorders you have.  Any surgeries you have had.  Any medical conditions you have.  Whether you are pregnant or may be pregnant. What are the risks? Generally, this is a safe procedure. However, problems may occur, including:  Allergic reaction to dye (contrast) that may be used during the procedure. What happens before the procedure? No specific preparation is needed. You may eat and drink normally. What happens during the procedure?   An IV tube may be inserted into one of your veins.  You may receive contrast through this tube. A contrast is an injection that improves the quality of the pictures from your heart.  A gel will be applied to your chest.  A wand-like tool (transducer) will be moved over your chest. The gel will help to transmit the sound waves from the transducer.  The sound waves will harmlessly bounce off of your heart to allow the heart images to be captured in real-time motion. The images will be recorded on a  computer. The procedure may vary among health care providers and hospitals. What happens after the procedure?  You may return to your normal, everyday life, including diet, activities, and medicines, unless your health care provider tells you not to do that. Summary  An echocardiogram is a procedure that uses painless sound waves (ultrasound) to produce an image of the  heart.  Images from an echocardiogram can provide important information about the size and shape of your heart, heart muscle function, heart valve function, and fluid buildup around your heart.  You do not need to do anything to prepare before this procedure. You may eat and drink normally.  After the echocardiogram is completed, you may return to your normal, everyday life, unless your health care provider tells you not to do that. This information is not intended to replace advice given to you by your health care provider. Make sure you discuss any questions you have with your health care provider. Document Released: 01/19/2000 Document Revised: 05/14/2018 Document Reviewed: 02/24/2016 Elsevier Patient Education  2020 [employer].

## 2018-11-01 ENCOUNTER — Other Ambulatory Visit: Payer: Self-pay | Admitting: Internal Medicine

## 2018-11-02 ENCOUNTER — Other Ambulatory Visit: Payer: Self-pay | Admitting: *Deleted

## 2018-11-02 MED ORDER — FUROSEMIDE 40 MG PO TABS: 40.0000 mg | ORAL_TABLET | Freq: Two times a day (BID) | ORAL | 0 refills | Status: DC

## 2018-11-05 DIAGNOSIS — H40003 Preglaucoma, unspecified, bilateral: Secondary | ICD-10-CM | POA: Diagnosis not present

## 2018-11-05 LAB — HM DIABETES EYE EXAM

## 2018-11-06 IMAGING — DX DG CHEST 2V
2 series · 2 of 2 positions shown · non-contrast
Comparison: 12/16/2015 and 02/24/2013

CLINICAL DATA: Shortness of breath for several weeks. Left heart
failure.

EXAM:
CHEST  2 VIEW

[chest pa]
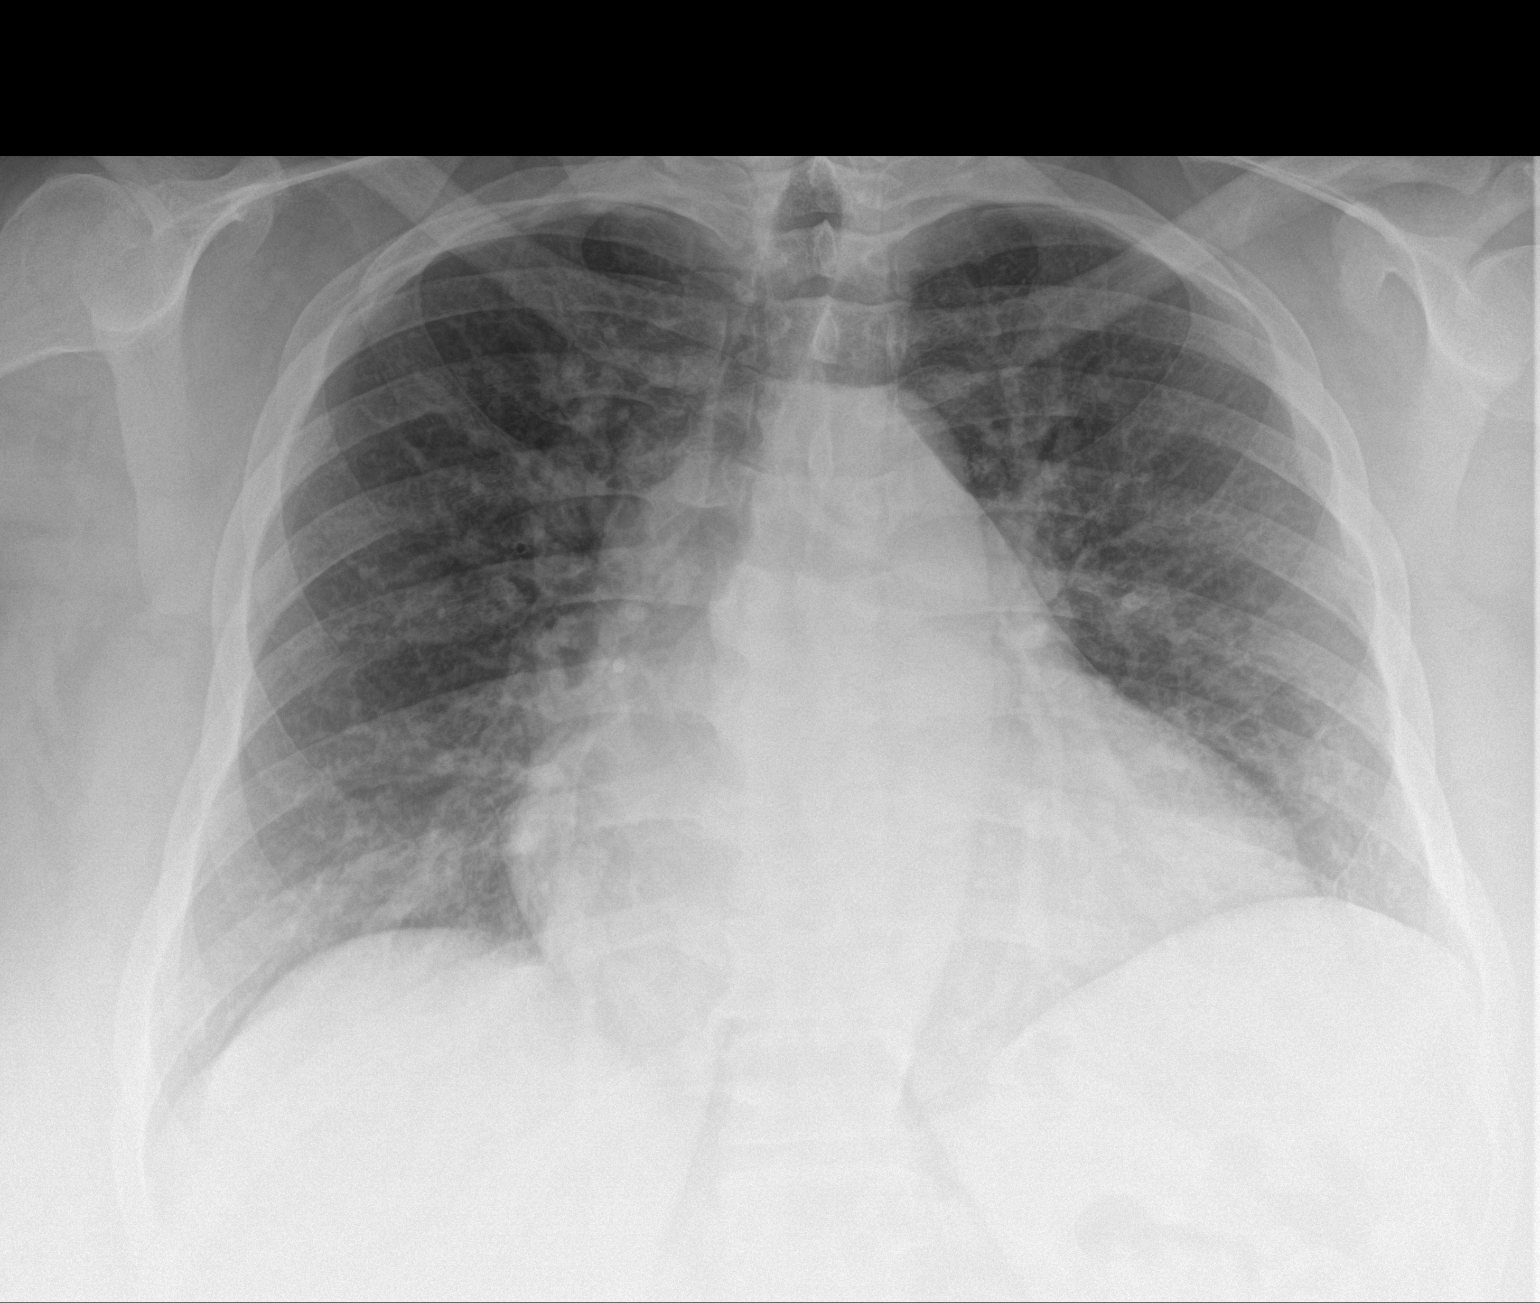

[chest lat]
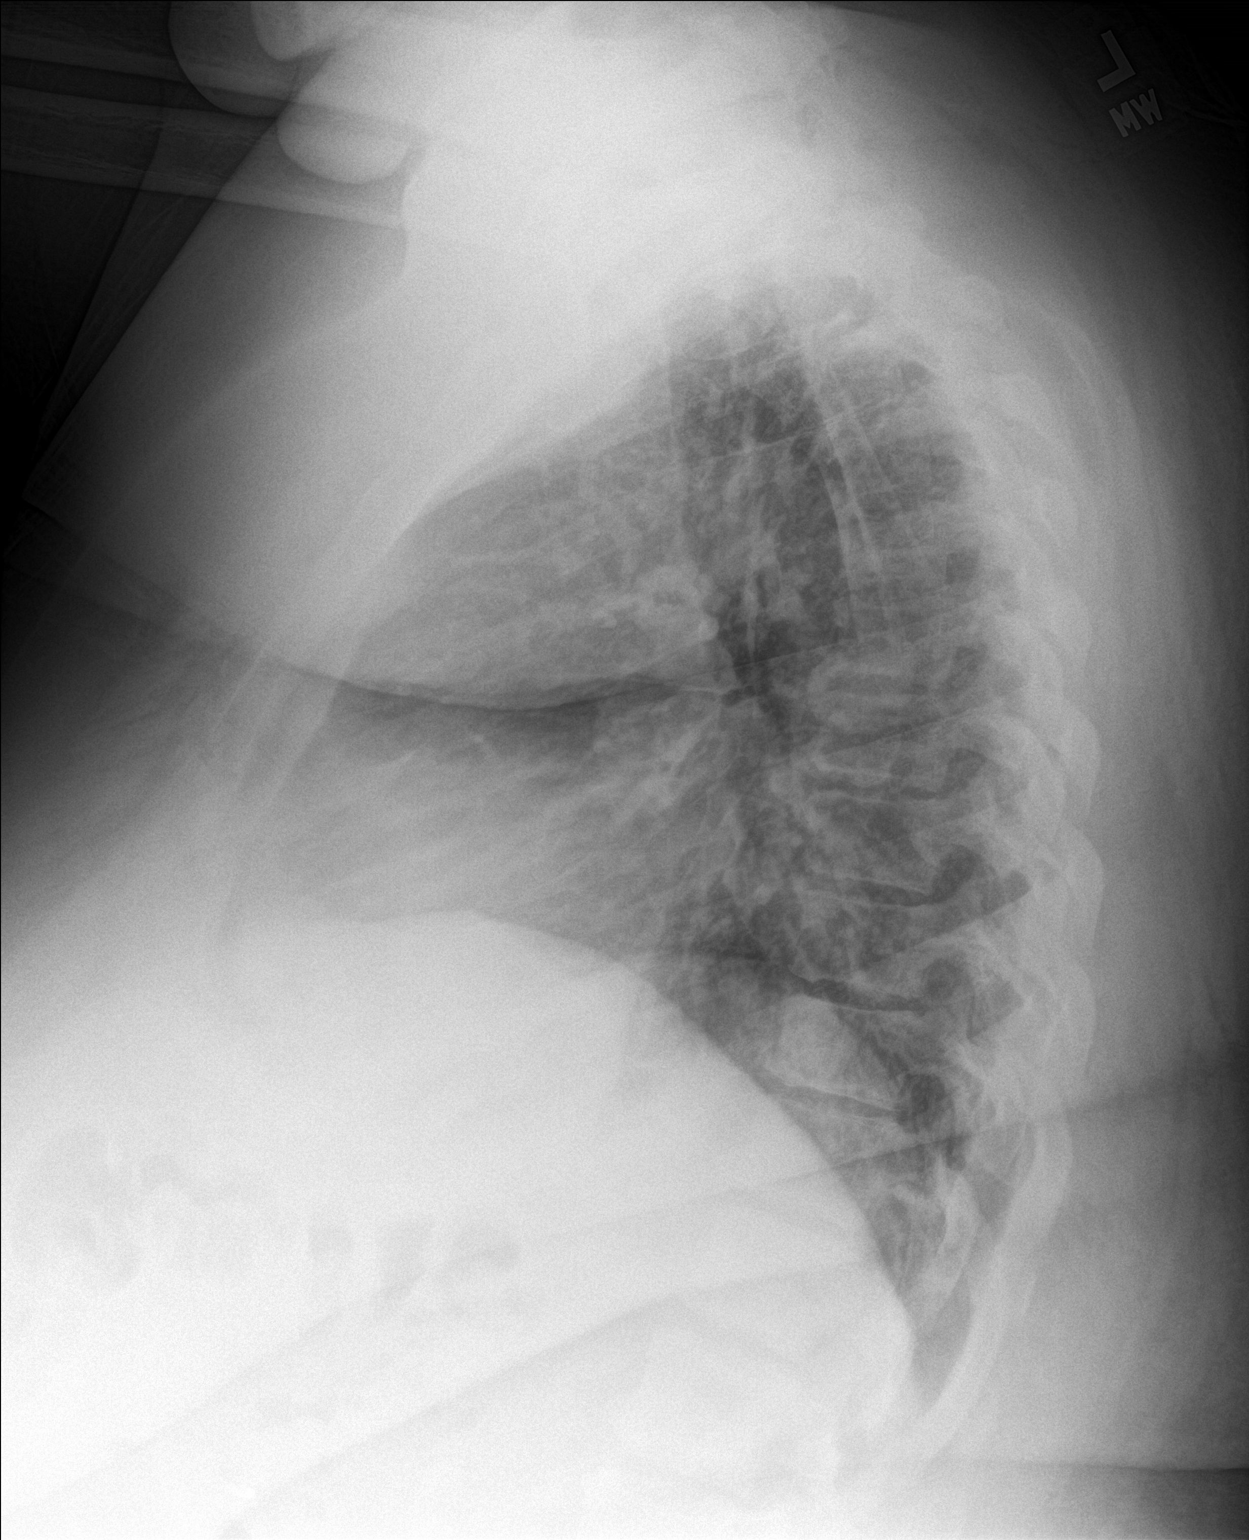

[2 of 2 positions shown; findings below may reference images not displayed]

FINDINGS: Stable mild cardiomegaly. Stable mild prominence of pulmonary
interstitial markings. No evidence of acute pulmonary infiltrate or
edema. No evidence of pleural effusion.
IMPRESSION: Stable mild cardiomegaly.  No active lung disease.

## 2018-11-23 ENCOUNTER — Ambulatory Visit (INDEPENDENT_AMBULATORY_CARE_PROVIDER_SITE_OTHER): Payer: Medicare Other

## 2018-11-23 ENCOUNTER — Other Ambulatory Visit: Payer: Self-pay

## 2018-11-23 DIAGNOSIS — R600 Localized edema: Secondary | ICD-10-CM | POA: Diagnosis not present

## 2018-11-23 DIAGNOSIS — I428 Other cardiomyopathies: Secondary | ICD-10-CM

## 2018-11-23 MED ORDER — PERFLUTREN LIPID MICROSPHERE
1.0000 mL | INTRAVENOUS | Status: AC | PRN
  Administered 2018-11-23 (×2): 2 mL via INTRAVENOUS

## 2018-12-01 ENCOUNTER — Other Ambulatory Visit: Payer: Self-pay

## 2018-12-01 ENCOUNTER — Ambulatory Visit: Payer: Self-pay | Admitting: Pharmacist

## 2018-12-01 ENCOUNTER — Ambulatory Visit (INDEPENDENT_AMBULATORY_CARE_PROVIDER_SITE_OTHER): Payer: Medicare Other | Admitting: Nurse Practitioner

## 2018-12-01 ENCOUNTER — Ambulatory Visit (INDEPENDENT_AMBULATORY_CARE_PROVIDER_SITE_OTHER): Payer: Medicare Other | Admitting: *Deleted

## 2018-12-01 ENCOUNTER — Encounter: Payer: Self-pay | Admitting: Nurse Practitioner

## 2018-12-01 VITALS — BP 138/88 | HR 84 | Temp 98.4°F

## 2018-12-01 DIAGNOSIS — I130 Hypertensive heart and chronic kidney disease with heart failure and stage 1 through stage 4 chronic kidney disease, or unspecified chronic kidney disease: Secondary | ICD-10-CM

## 2018-12-01 DIAGNOSIS — E785 Hyperlipidemia, unspecified: Secondary | ICD-10-CM | POA: Diagnosis not present

## 2018-12-01 DIAGNOSIS — Z794 Long term (current) use of insulin: Secondary | ICD-10-CM

## 2018-12-01 DIAGNOSIS — E119 Type 2 diabetes mellitus without complications: Secondary | ICD-10-CM

## 2018-12-01 DIAGNOSIS — I5032 Chronic diastolic (congestive) heart failure: Secondary | ICD-10-CM

## 2018-12-01 DIAGNOSIS — E559 Vitamin D deficiency, unspecified: Secondary | ICD-10-CM

## 2018-12-01 DIAGNOSIS — Z6841 Body Mass Index (BMI) 40.0 and over, adult: Secondary | ICD-10-CM

## 2018-12-01 DIAGNOSIS — E1169 Type 2 diabetes mellitus with other specified complication: Secondary | ICD-10-CM

## 2018-12-01 DIAGNOSIS — N182 Chronic kidney disease, stage 2 (mild): Secondary | ICD-10-CM

## 2018-12-01 LAB — BAYER DCA HB A1C WAIVED: HB A1C (BAYER DCA - WAIVED): 6.8 % (ref <7.0)

## 2018-12-01 NOTE — Progress Notes (Signed)
BP 138/88 (BP Location: Left Arm, Patient Position: Sitting)   Pulse 84   Temp 98.4 F (36.9 C) (Oral)   SpO2 92%    Subjective:    Patient ID: Sara Zhang, female    DOB: [birth year], 48 y.o.   MRN: [id number]  HPI: ESRAA SERES is a 48 y.o. female  Chief Complaint  Patient presents with  . Diabetes    Dr. Wallace Going-  Eye  . Hyperlipidemia  . Hypertension   DIABETES Current medications include Glipizide 5 MG BID, Toujeo 56 units QHS, and Bydureon 2 MG weekly.  A1C in July 7.1%.  Her previous A1C in January was trending downwards to 7.9%.  She is followed by endocrinology and her last visit with them was 08/31/18. A1C today 6.8%. Polydipsia/polyuria: no Visual disturbance: no Chest pain: no Paresthesias: no Glucose Monitoring: yes  Accucheck frequency: BID  Fasting glucose: 62 this morning (ate a biscuit) 70 to 120 average  Evening: 121-229  Before meals: Taking Insulin?: yes  Long acting insulin: 56 units  Short acting insulin: Blood Pressure Monitoring: not checking Retinal Examination: Not up to Date Foot Exam: Not up to Date Pneumovax: Up to Date Influenza: Up to Date Aspirin: no   HYPERTENSION / HYPERLIPIDEMIA/HF Current medications include Lisinopril, Furosemide, Atenolol for HTN + Lipitor for HLD.  Followed by cardiology and last saw Dr. Saunders Revel 10/22/18, she reports she sees them in upcoming month.  Is working on diet as has been eating a lot of pork and salty foods.  History of low Vit D level, continues on supplement. Satisfied with current treatment? yes Duration of hypertension: chronic BP monitoring frequency: not checking BP range:  BP medication side effects: no Duration of hyperlipidemia: chronic Cholesterol medication side effects: no Cholesterol supplements: none Medication compliance: good compliance Aspirin: no Recent stressors: no Recurrent headaches: no Visual changes: no Palpitations: no Dyspnea: no Chest pain: no Lower  extremity edema: no Dizzy/lightheaded: no  Relevant past medical, surgical, family and social history reviewed and updated as indicated. Interim medical history since our last visit reviewed. Allergies and medications reviewed and updated.  Review of Systems  Constitutional: Negative for activity change, appetite change, diaphoresis, fatigue and fever.  Respiratory: Negative for cough, chest tightness, shortness of breath and wheezing.   Cardiovascular: Negative for chest pain, palpitations and leg swelling.  Gastrointestinal: Negative for abdominal distention, abdominal pain, constipation, diarrhea, nausea and vomiting.  Endocrine: Negative for cold intolerance, heat intolerance, polydipsia, polyphagia and polyuria.  Neurological: Negative for dizziness, syncope, weakness, light-headedness, numbness and headaches.  Psychiatric/Behavioral: Negative.     Per HPI unless specifically indicated above     Objective:    BP 138/88 (BP Location: Left Arm, Patient Position: Sitting)   Pulse 84   Temp 98.4 F (36.9 C) (Oral)   SpO2 92%   Wt Readings from Last 3 Encounters:  10/22/18 (!) 389 lb (176.4 kg)  08/27/18 (!) 387 lb (175.5 kg)  02/24/18 (!) 386 lb 6 oz (175.3 kg)    Physical Exam Vitals signs and nursing note reviewed.  Constitutional:      General: She is awake. She is not in acute distress.    Appearance: She is well-developed. She is morbidly obese. She is not ill-appearing.  HENT:     Head: Normocephalic.     Right Ear: Hearing normal.     Left Ear: Hearing normal.  Eyes:     General: Lids are normal.        Right  eye: No discharge.        Left eye: No discharge.     Conjunctiva/sclera: Conjunctivae normal.     Pupils: Pupils are equal, round, and reactive to light.  Neck:     Musculoskeletal: Normal range of motion and neck supple.     Thyroid: No thyromegaly.     Vascular: No carotid bruit.  Cardiovascular:     Rate and Rhythm: Normal rate and regular rhythm.      Heart sounds: Normal heart sounds. No murmur. No gallop.   Pulmonary:     Effort: Pulmonary effort is normal. No accessory muscle usage or respiratory distress.     Breath sounds: Normal breath sounds.  Abdominal:     General: Bowel sounds are normal.     Palpations: Abdomen is soft. There is no hepatomegaly or splenomegaly.  Musculoskeletal:     Right lower leg: Edema (trace) present.     Left lower leg: Edema (trace) present.  Skin:    General: Skin is warm and dry.  Neurological:     Mental Status: She is alert and oriented to person, place, and time.  Psychiatric:        Attention and Perception: Attention normal.        Mood and Affect: Mood normal.        Behavior: Behavior normal. Behavior is cooperative.        Thought Content: Thought content normal.        Judgment: Judgment normal.    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  Continue current medication regimen and collaboration with cardiology.  Recommend checking BP three mornings a week at home + documenting for providers.        Chronic diastolic heart failure (HCC)    Chronic, ongoing with BP at goal.  Continue current medication regimen and collaboration with cardiology.  Recommend: - Reminded to call for an overnight weight gain of >2 pounds or a  weekly weight weight of >5 pounds - not adding salt to his food and has been reading food labels. Reviewed the importance of keeping daily sodium intake to 2000mg  daily         Endocrine   Hyperlipidemia associated with type 2 diabetes mellitus (HCC)    Chronic, ongoing with recent LDL 55 and TCHOL 113.  Continue current medication regimen as at goal.  Lipid panel next visit and CMP today.      Relevant Orders   Bayer DCA Hb A1c Waived   Comprehensive metabolic panel   Type 2 diabetes mellitus treated with insulin (HCC) - Primary    Chronic, ongoing.  Followed by endo.  A1C today 6.8%.  Continue current medication regimen at this time, as ordered by endocrinology.  Continue collaboration with endocrinology.  Praised for success on continued  downward trend of A1C.  CCM referral placed, would benefit from discontinuation of Glipizide and possibly addition of SGLT for heart with HF.  Return in 3 months.       Relevant Orders   Bayer DCA Hb A1c Waived     Other   Morbid obesity (HCC)    Recommend continued focus on health diet choices and regular physical activity (30 minutes 5 days a week).       Vitamin D deficiency    Recheck Vit D level today and continue supplement daily.      Relevant Orders   VITAMIN D 25 Hydroxy (Vit-D Deficiency, Fractures)   BMI 50.0-59.9, adult (HCC)    Recommend continued focus on health diet choices and regular physical activity (30 minutes 5 days a week).          I discussed the assessment and treatment plan with the patient. The patient was provided an opportunity to ask questions and all were answered. The patient agreed with the plan and demonstrated an understanding of the instructions.   The patient was advised to call back or seek an in-person evaluation if the symptoms worsen or if the condition fails to improve as anticipated.   I provided 15 minutes of time during this encounter.  Follow up plan: Return in about 3 months (around  03/03/2019) for T2DM, HTN/HLD, HF.

## 2018-12-01 NOTE — Assessment & Plan Note (Signed)
Recommend continued focus on health diet choices and regular physical activity (30 minutes 5 days a week). 

## 2018-12-01 NOTE — Assessment & Plan Note (Signed)
Chronic, ongoing.  Followed by endo.  A1C today 6.8%.  Continue current medication regimen at this time, as ordered by endocrinology.  Continue collaboration with endocrinology.  Praised for success on continued downward trend of A1C.  CCM referral placed, would benefit from discontinuation of Glipizide and possibly addition of SGLT for heart with HF.  Return in 3 months.

## 2018-12-01 NOTE — Assessment & Plan Note (Signed)
Chronic, ongoing with BP at goal.  Continue current medication regimen and collaboration with cardiology.  Recommend checking BP three mornings a week at home + documenting for providers.

## 2018-12-01 NOTE — Chronic Care Management (AMB) (Signed)
Chronic Care Management   Initial Visit Note  12/01/2018 Name: Sara Zhang MRN: [id number] DOB: 10-29-1970  Referred by: Venita Lick, NP Reason for referral : Chronic Care Management (CHF, DM, HTN, DIET)   Sara Zhang is a 48 y.o. year old female who is a primary care patient of Cannady, Barbaraann Faster, NP. The CCM team was consulted for assistance with chronic disease management and care coordination needs related to CHF, HTN, HLD, DMII, CKD Stage 2 and Obesity  Review of patient status, including review of consultants reports, relevant laboratory and other test results, and collaboration with appropriate care team members and the patient's provider was performed as part of comprehensive patient evaluation and provision of chronic care management services.    SDOH (Social Determinants of Health) screening performed today: None. See Care Plan for related entries.   Advanced Directives Status: N See Care Plan and Vynca application for related entries.   Medications: Outpatient Encounter Medications as of 12/01/2018  Medication Sig  . atenolol (TENORMIN) 50 MG tablet TAKE 1 TABLET BY MOUTH ONCE DAILY  . atorvastatin (LIPITOR) 10 MG tablet TAKE 1 TABLET BY MOUTH EVERY DAY  . blood glucose meter kit and supplies KIT Dispense based on patient and insurance preference. Use up to four times daily as directed. (FOR ICD-9 250.00, 250.01).  . Calcium Carbonate-Vitamin D (OYSTER SHELL CALCIUM 500 + D) 500-125 MG-UNIT TABS Take by mouth daily.  . Exenatide ER (BYDUREON BCISE) 2 MG/0.85ML AUIJ Inject 2 mg into the skin once a week.  . ferrous sulfate 325 (65 FE) MG tablet Take 1 tablet (325 mg total) by mouth daily with breakfast.  . furosemide (LASIX) 40 MG tablet Take 1 tablet (40 mg total) by mouth 2 (two) times daily.  Marland Kitchen glipiZIDE (GLUCOTROL) 10 MG tablet TAKE 1 TABLET BY MOUTH TWICE A DAY WITH MEALS  . glucose blood (CONTOUR NEXT TEST) test strip TEST BLOOD SUGAR ONCE DAILY  . Insulin  Glargine (TOUJEO SOLOSTAR) 300 UNIT/ML SOPN Inject 54 Units into the skin at bedtime.  . Insulin Pen Needle 32G X 4 MM MISC 1 Units by Does not apply route every morning. Pen needles  . levonorgestrel (MIRENA) 20 MCG/24HR IUD 1 each by Intrauterine route once.  Marland Kitchen lisinopril (ZESTRIL) 40 MG tablet TAKE 1 TABLET BY MOUTH EVERY DAY  . OXYGEN Inhale 2 L into the lungs daily.  Marland Kitchen VITAMIN D PO Take by mouth daily.   No facility-administered encounter medications on file as of 12/01/2018.      Objective:   Goals Addressed            This Visit's Progress   . RN-Support with multiple chronic health conditions (pt-stated)       Current Barriers:  . Chronic Disease Management support and education needs related to HF, DM, HTN, CKD, and hyperlipidemia  Nurse Case Manager Clinical Goal(s):  Marland Kitchen Over the next 90 days, patient will work with [facility] to address needs related to managing multiple chronic diseases  Interventions:  . Discussed plans with patient for ongoing care management follow up and provided patient with direct contact information for care management team . Met with patient in person at PCP visit, discussed the role of Chronic Care Management.   Patient Self Care Activities:  . Performs ADL's independently . Difficulty managing multiple chronic conditions  Initial goal documentation         Sara Zhang was given information about Chronic Care Management services today including:  1.  CCM service includes personalized support from designated clinical staff supervised by her physician, including individualized plan of care and coordination with other care providers 2. 24/7 contact phone numbers for assistance for urgent and routine care needs. 3. Service will only be billed when office clinical staff spend 20 minutes or more in a month to coordinate care. 4. Only one practitioner may furnish and bill the service in a calendar month. 5. The patient may stop CCM services at any  time (effective at the end of the month) by phone call to the office staff. 6. The patient will be responsible for cost sharing (co-pay) of up to 20% of the service fee (after annual deductible is met).  Patient agreed to services and verbal consent obtained.   Plan:   The care management team will reach out to the patient again over the next 30 days.  The patient has been provided with contact information for the care management team and has been advised to call with any health related questions or concerns.    Merlene Morse Jesicca Dipierro RN, BSN Nurse Case Editor, [facility]  [phone]) Business Mobile

## 2018-12-01 NOTE — Assessment & Plan Note (Signed)
Recheck Vit D level today and continue supplement daily.

## 2018-12-01 NOTE — Patient Instructions (Signed)
Carbohydrate Counting for Diabetes Mellitus, Adult  Carbohydrate counting is a method of keeping track of how many carbohydrates you eat. Eating carbohydrates naturally increases the amount of sugar (glucose) in the blood. Counting how many carbohydrates you eat helps keep your blood glucose within normal limits, which helps you manage your diabetes (diabetes mellitus). It is important to know how many carbohydrates you can safely have in each meal. This is different for every person. A diet and nutrition specialist (registered dietitian) can help you make a meal plan and calculate how many carbohydrates you should have at each meal and snack. Carbohydrates are found in the following foods:  Grains, such as breads and cereals.  Dried beans and soy products.  Starchy vegetables, such as potatoes, peas, and corn.  Fruit and fruit juices.  Milk and yogurt.  Sweets and snack foods, such as cake, cookies, candy, chips, and soft drinks. How do I count carbohydrates? There are two ways to count carbohydrates in food. You can use either of the methods or a combination of both. Reading "Nutrition Facts" on packaged food The "Nutrition Facts" list is included on the labels of almost all packaged foods and beverages in the [place] It includes:  The serving size.  Information about nutrients in each serving, including the grams (g) of carbohydrate per serving. To use the "Nutrition Facts":  Decide how many servings you will have.  Multiply the number of servings by the number of carbohydrates per serving.  The resulting number is the total amount of carbohydrates that you will be having. Learning standard serving sizes of other foods When you eat carbohydrate foods that are not packaged or do not include "Nutrition Facts" on the label, you need to measure the servings in order to count the amount of carbohydrates:  Measure the foods that you will eat with a food scale or measuring cup, if needed.   Decide how many standard-size servings you will eat.  Multiply the number of servings by 15. Most carbohydrate-rich foods have about 15 g of carbohydrates per serving. ? For example, if you eat 8 oz (170 g) of strawberries, you will have eaten 2 servings and 30 g of carbohydrates (2 servings x 15 g = 30 g).  For foods that have more than one food mixed, such as soups and casseroles, you must count the carbohydrates in each food that is included. The following list contains standard serving sizes of common carbohydrate-rich foods. Each of these servings has about 15 g of carbohydrates:   hamburger bun or  English muffin.   oz (15 mL) syrup.   oz (14 g) jelly.  1 slice of bread.  1 six-inch tortilla.  3 oz (85 g) cooked rice or pasta.  4 oz (113 g) cooked dried beans.  4 oz (113 g) starchy vegetable, such as peas, corn, or potatoes.  4 oz (113 g) hot cereal.  4 oz (113 g) mashed potatoes or  of a large baked potato.  4 oz (113 g) canned or frozen fruit.  4 oz (120 mL) fruit juice.  4-6 crackers.  6 chicken nuggets.  6 oz (170 g) unsweetened dry cereal.  6 oz (170 g) plain fat-free yogurt or yogurt sweetened with artificial sweeteners.  8 oz (240 mL) milk.  8 oz (170 g) fresh fruit or one small piece of fruit.  24 oz (680 g) popped popcorn. Example of carbohydrate counting Sample meal  3 oz (85 g) chicken breast.  6 oz (170 g)   brown rice.  4 oz (113 g) corn.  8 oz (240 mL) milk.  8 oz (170 g) strawberries with sugar-free whipped topping. Carbohydrate calculation 1. Identify the foods that contain carbohydrates: ? Rice. ? Corn. ? Milk. ? Strawberries. 2. Calculate how many servings you have of each food: ? 2 servings rice. ? 1 serving corn. ? 1 serving milk. ? 1 serving strawberries. 3. Multiply each number of servings by 15 g: ? 2 servings rice x 15 g = 30 g. ? 1 serving corn x 15 g = 15 g. ? 1 serving milk x 15 g = 15 g. ? 1 serving  strawberries x 15 g = 15 g. 4. Add together all of the amounts to find the total grams of carbohydrates eaten: ? 30 g + 15 g + 15 g + 15 g = 75 g of carbohydrates total. Summary  Carbohydrate counting is a method of keeping track of how many carbohydrates you eat.  Eating carbohydrates naturally increases the amount of sugar (glucose) in the blood.  Counting how many carbohydrates you eat helps keep your blood glucose within normal limits, which helps you manage your diabetes.  A diet and nutrition specialist (registered dietitian) can help you make a meal plan and calculate how many carbohydrates you should have at each meal and snack. This information is not intended to replace advice given to you by your health care provider. Make sure you discuss any questions you have with your health care provider. Document Released: 01/21/2005 Document Revised: 08/15/2016 Document Reviewed: 07/05/2015 Elsevier Patient Education  2020 [employer]  

## 2018-12-01 NOTE — Assessment & Plan Note (Signed)
Chronic, ongoing with recent LDL 55 and TCHOL 113.  Continue current medication regimen as at goal.  Lipid panel next visit and CMP today.

## 2018-12-01 NOTE — Chronic Care Management (AMB) (Signed)
Chronic Care Management   Note  12/01/2018 Name: Sara Zhang MRN: [id number] DOB: 1970/05/25   Subjective:  Sara Zhang is a 48 y.o. year old female who is a primary care patient of Sara Zhang. The CCM team was consulted for assistance with chronic disease management and care coordination needs.    Met with patient face to face during appointment with Sara Zhang today.   Sara Zhang was given information about Chronic Care Management services today including:  1. CCM service includes personalized support from designated clinical staff supervised by her physician, including individualized plan of care and coordination with other care providers 2. 24/7 contact phone numbers for assistance for urgent and routine care needs. 3. Service will only be billed when office clinical staff spend 20 minutes or more in a month to coordinate care. 4. Only one practitioner may furnish and bill the service in a calendar month. 5. The patient may stop CCM services at any time (effective at the end of the month) by phone call to the office staff. 6. The patient will be responsible for cost sharing (co-pay) of up to 20% of the service fee (after annual deductible is met).  Patient agreed to services and verbal consent obtained.   Review of patient status, including review of consultants reports, laboratory and other test data, was performed as part of comprehensive evaluation and provision of chronic care management services.   Objective:  Lab Results  Component Value Date   CREATININE 1.29 (H) 08/27/2018   CREATININE 1.07 (H) 02/24/2018   CREATININE 0.98 08/20/2017    Lab Results  Component Value Date   HGBA1C 7.1 (H) 08/27/2018       Component Value Date/Time   CHOL 113 08/27/2018 1100   TRIG 69 08/27/2018 1100   HDL 38 (L) 08/20/2017 0956   VLDL 14 08/27/2018 1100   LDLCALC 52 08/20/2017 0956    Clinical ASCVD: No  The ASCVD Risk score (Goff DC Jr., et al.,  2013) failed to calculate for the following reasons:   The valid total cholesterol range is 130 to 320 mg/dL    BP Readings from Last 3 Encounters:  12/01/18 138/88  10/22/18 138/88  08/27/18 135/88    No Known Allergies  Medications Reviewed Today    Reviewed by Sara Lick, Zhang (Nurse Practitioner) on 12/01/18 at 67  Med List Status: <None>  Medication Order Taking? Sig Documenting Provider Last Dose Status Informant  atenolol (TENORMIN) 50 MG tablet [id number] Yes TAKE 1 TABLET BY MOUTH ONCE DAILY Sara Zhang Taking Active   atorvastatin (LIPITOR) 10 MG tablet [id number] Yes TAKE 1 TABLET BY MOUTH EVERY DAY Sara Zhang Taking Active   blood glucose meter kit and supplies KIT [id number] Yes Dispense based on patient and insurance preference. Use up to four times daily as directed. (FOR ICD-9 250.00, 250.01). Sara Zhang T, Zhang Taking Active   Calcium Carbonate-Vitamin D (OYSTER SHELL CALCIUM 500 + D) 500-125 MG-UNIT TABS [id number] Yes Take by mouth daily. Provider, Historical, Zhang Taking Active   Exenatide ER (BYDUREON BCISE) 2 MG/0.85ML AUIJ [id number] Yes Inject 2 mg into the skin once a week. Sara Haddock, Zhang Taking Active   ferrous sulfate 325 (65 FE) MG tablet [id number] Yes Take 1 tablet (325 mg total) by mouth daily with breakfast. Sara Haddock, Zhang Taking Active   furosemide (LASIX) 40 MG tablet [id number] Yes Take 1 tablet (40 mg total) by mouth 2 (two) times daily. Sara Zhang,  Sara Boll, Zhang Taking Active   glipiZIDE (GLUCOTROL) 10 MG tablet [id number] Yes TAKE 1 TABLET BY MOUTH TWICE A DAY WITH MEALS Sara Zhang Taking Active   glucose blood (CONTOUR NEXT TEST) test strip [id number] Yes TEST BLOOD SUGAR ONCE DAILY Sara Zhang Taking Active   Insulin Glargine (TOUJEO SOLOSTAR) 300 UNIT/ML SOPN [id number] Yes Inject 54 Units into the skin at bedtime. Sara Zhang T, Zhang Taking Active   Insulin Pen Needle 32G X 4 MM MISC [id number] Yes 1  Units by Does not apply route every morning. Pen needles Sara Haddock, Zhang Taking Active   levonorgestrel (MIRENA) 20 MCG/24HR IUD [id number] Yes 1 each by Intrauterine route once. Provider, Historical, Zhang Taking Active   lisinopril (ZESTRIL) 40 MG tablet [id number] Yes TAKE 1 TABLET BY MOUTH EVERY DAY Sara Zhang Taking Active   OXYGEN [id number] Yes Inhale 2 L into the lungs daily. Provider, Historical, Zhang Taking Active   VITAMIN D PO [id number] Yes Take by mouth daily. Provider, Historical, Zhang Taking Active            Assessment:   Goals Addressed            This Visit's Progress     Patient Stated   . PharmD "I have a lot of medications" (pt-stated)       Current Barriers:  . Diabetes: controlled, though not on optimized regimen; most recent A1c 6.8% o Follows w/ [facility] Endocrinology; last appointment 08/2018; patient noted she doesn't have an appointment with them "for a few more weeks" . Current antihyperglycemic regimen: Toujeo 54 units, Bydureon 2 mg weekly, glipizide XL 10 mg daily  . Reports episodes of hypoglycemia, generally during the day if she doesn't eat, occurring 2-4 times per week . Cardiovascular risk reduction- CHF, follows w/ Sara Zhang  o Current hypertensive regimen: atenolol 50 mg daily, furosemide 40 mg daily, lisinopril 40 mg daily o Current hyperlipidemia regimen: atorvastatin 10 mg daily; last LDL at goal <70  Pharmacist Clinical Goal(s):  Marland Kitchen Over the next 90 days, patient with work with PharmD and primary care provider to address optimized glycemic management  Interventions: . Discussed mechanism of action of glipizide w/ patient, discussed that it is likely causing hypoglycemia.  . Discussed dual HF/T2DM benefit of SGLT2 therapy with patient. She is concerned about "all the side effects they say on TV". Discussed that most common sx in women is generally genitourinary infections, but with her currently well controlled sugars, she has less risk of sx. .  Would recommend switching Bydureon to Victoza for improved weight loss and ASCVD risk reduction. Downside is that it would be once daily vs once weekly, however, [place] Medicaid does not cover Ozempic/Trulicity.  . In reviewing chart, noted that patient's f/u with endocrinology is tomorrow. Contacted [facility] endocrinology; left message for [facility] nurse recommend d/c glipizide and start Jardiance, also consider switch from Bydureon to [place]. Left my call back number with questions . Contacted patient, discussed these above recommendations and that her appointment is tomorrow. She is unsure about switching from a weekly to a daily injection, but I urged her to at least think about it and talk to [place] about it tomorrow. She verbalized understanding.   Patient Self Care Activities:  . Patient will check blood glucose BID , document, and provide at future appointments . Patient will take medications as prescribed . Patient will report any questions or concerns to provider   Initial goal documentation  Plan: - CCM team will outreach patient in the next 4-5 weeks for continued support  Catie Darnelle Maffucci, PharmD Clinical Pharmacist [place] [phone]

## 2018-12-01 NOTE — Assessment & Plan Note (Signed)
Chronic, ongoing with BP at goal.  Continue current medication regimen and collaboration with cardiology.  Recommend: - Reminded to call for an overnight weight gain of >2 pounds or a weekly weight weight of >5 pounds - not adding salt to his food and has been reading food labels. Reviewed the importance of keeping daily sodium intake to <2000mg daily  

## 2018-12-01 NOTE — Patient Instructions (Signed)
Visit Information  Goals Addressed            This Visit's Progress     Patient Stated   . PharmD "I have a lot of medications" (pt-stated)       Current Barriers:  . Diabetes: controlled, though not on optimized regimen; most recent A1c 6.8% o Follows w/ [facility] Endocrinology; last appointment 08/2018; patient noted she doesn't have an appointment with them "for a few more weeks" . Current antihyperglycemic regimen: Toujeo 54 units, Bydureon 2 mg weekly, glipizide XL 10 mg daily  . Reports episodes of hypoglycemia, generally during the day if she doesn't eat, occurring 2-4 times per week . Cardiovascular risk reduction- CHF, follows w/ Dr. Saunders Revel  o Current hypertensive regimen: atenolol 50 mg daily, furosemide 40 mg daily, lisinopril 40 mg daily o Current hyperlipidemia regimen: atorvastatin 10 mg daily; last LDL at goal <70  Pharmacist Clinical Goal(s):  Marland Kitchen Over the next 90 days, patient with work with PharmD and primary care provider to address optimized glycemic management  Interventions: . Discussed mechanism of action of glipizide w/ patient, discussed that it is likely causing hypoglycemia.  . Discussed dual HF/T2DM benefit of SGLT2 therapy with patient. She is concerned about "all the side effects they say on TV". Discussed that most common sx in women is generally genitourinary infections, but with her currently well controlled sugars, she has less risk of sx. . Would recommend switching Bydureon to Victoza for improved weight loss and ASCVD risk reduction. Downside is that it would be once daily vs once weekly, however, [place] Medicaid does not cover Ozempic/Trulicity.  . In reviewing chart, noted that patient's f/u with endocrinology is tomorrow. Contacted [facility] endocrinology; left message for [facility] nurse recommend d/c glipizide and start Jardiance, also consider switch from Bydureon to [place]. Left my call back number with questions . Contacted patient, discussed these above  recommendations and that her appointment is tomorrow. She is unsure about switching from a weekly to a daily injection, but I urged her to at least think about it and talk to [place] about it tomorrow. She verbalized understanding.   Patient Self Care Activities:  . Patient will check blood glucose BID , document, and provide at future appointments . Patient will take medications as prescribed . Patient will report any questions or concerns to provider   Initial goal documentation        The patient verbalized understanding of instructions provided today and declined a print copy of patient instruction materials.    Plan: - CCM team will outreach patient in the next 4-5 weeks for continued support  Catie Darnelle Maffucci, PharmD Clinical Pharmacist [place] [phone]

## 2018-12-02 DIAGNOSIS — E1169 Type 2 diabetes mellitus with other specified complication: Secondary | ICD-10-CM | POA: Diagnosis not present

## 2018-12-02 DIAGNOSIS — E1159 Type 2 diabetes mellitus with other circulatory complications: Secondary | ICD-10-CM | POA: Diagnosis not present

## 2018-12-02 DIAGNOSIS — E559 Vitamin D deficiency, unspecified: Secondary | ICD-10-CM | POA: Diagnosis not present

## 2018-12-02 DIAGNOSIS — E785 Hyperlipidemia, unspecified: Secondary | ICD-10-CM | POA: Diagnosis not present

## 2018-12-02 DIAGNOSIS — R809 Proteinuria, unspecified: Secondary | ICD-10-CM | POA: Diagnosis not present

## 2018-12-02 DIAGNOSIS — Z6841 Body Mass Index (BMI) 40.0 and over, adult: Secondary | ICD-10-CM | POA: Diagnosis not present

## 2018-12-02 DIAGNOSIS — E1165 Type 2 diabetes mellitus with hyperglycemia: Secondary | ICD-10-CM | POA: Diagnosis not present

## 2018-12-02 DIAGNOSIS — E1129 Type 2 diabetes mellitus with other diabetic kidney complication: Secondary | ICD-10-CM | POA: Diagnosis not present

## 2018-12-02 DIAGNOSIS — I10 Essential (primary) hypertension: Secondary | ICD-10-CM | POA: Diagnosis not present

## 2018-12-02 DIAGNOSIS — Z794 Long term (current) use of insulin: Secondary | ICD-10-CM | POA: Diagnosis not present

## 2018-12-02 LAB — COMPREHENSIVE METABOLIC PANEL
ALT: 11 IU/L (ref 0–32)
AST: 11 IU/L (ref 0–40)
Albumin/Globulin Ratio: 1 — ABNORMAL LOW (ref 1.2–2.2)
Albumin: 3.5 g/dL — ABNORMAL LOW (ref 3.8–4.8)
Alkaline Phosphatase: 100 IU/L (ref 39–117)
BUN/Creatinine Ratio: 15 (ref 9–23)
BUN: 14 mg/dL (ref 6–24)
Bilirubin Total: 0.3 mg/dL (ref 0.0–1.2)
CO2: 26 mmol/L (ref 20–29)
Calcium: 8 mg/dL — ABNORMAL LOW (ref 8.7–10.2)
Chloride: 102 mmol/L (ref 96–106)
Creatinine, Ser: 0.94 mg/dL (ref 0.57–1.00)
GFR calc Af Amer: 82 mL/min/{1.73_m2} (ref >59)
GFR calc non Af Amer: 71 mL/min/{1.73_m2} (ref >59)
Globulin, Total: 3.4 g/dL (ref 1.5–4.5)
Glucose: 81 mg/dL (ref 65–99)
Potassium: 3.9 mmol/L (ref 3.5–5.2)
Sodium: 140 mmol/L (ref 134–144)
Total Protein: 6.9 g/dL (ref 6.0–8.5)

## 2018-12-02 LAB — VITAMIN D 25 HYDROXY (VIT D DEFICIENCY, FRACTURES): Vit D, 25-Hydroxy: 44.3 ng/mL (ref 30.0–100.0)

## 2018-12-02 NOTE — Progress Notes (Signed)
Normal test results noted.  Please call patient and make them aware of normal results and will continue to monitor at regular visits.  Have a great day.  Look forward to seeing you at your next visit.

## 2018-12-03 NOTE — Patient Instructions (Signed)
Thank you allowing the Chronic Care Management Team to be a part of your care! It was a pleasure speaking with you today!  CCM (Chronic Care Management) Team   Elissia Spiewak RN, BSN Nurse Care Coordinator  [phone]  [facility] PharmD  Clinical Pharmacist  [phone]  Eula Fried LCSW Clinical Social Worker [phone]  Goals Addressed            This Visit's Progress   . RN-Support with multiple chronic health conditions (pt-stated)       Current Barriers:  . Chronic Disease Management support and education needs related to HF, DM, HTN, CKD, and hyperlipidemia  Nurse Case Manager Clinical Goal(s):  Marland Kitchen Over the next 90 days, patient will work with [facility] to address needs related to managing multiple chronic diseases  Interventions:  . Discussed plans with patient for ongoing care management follow up and provided patient with direct contact information for care management team . Met with patient in person at PCP visit, discussed the role of Chronic Care Management.   Patient Self Care Activities:  . Performs ADL's independently . Difficulty managing multiple chronic conditions  Initial goal documentation        The patient verbalized understanding of instructions provided today and declined a print copy of patient instruction materials.   The patient has been provided with contact information for the care management team and has been advised to call with any health related questions or concerns.

## 2018-12-07 ENCOUNTER — Other Ambulatory Visit: Payer: Self-pay

## 2018-12-07 ENCOUNTER — Ambulatory Visit: Payer: Self-pay | Admitting: Pharmacist

## 2018-12-07 DIAGNOSIS — E119 Type 2 diabetes mellitus without complications: Secondary | ICD-10-CM

## 2018-12-07 DIAGNOSIS — I5032 Chronic diastolic (congestive) heart failure: Secondary | ICD-10-CM

## 2018-12-07 DIAGNOSIS — Z794 Long term (current) use of insulin: Secondary | ICD-10-CM

## 2018-12-07 MED ORDER — ATORVASTATIN CALCIUM 10 MG PO TABS: 10.0000 mg | ORAL_TABLET | Freq: Every day | ORAL | 1 refills | Status: DC

## 2018-12-07 NOTE — Chronic Care Management (AMB) (Signed)
Chronic Care Management   Follow Up Note   12/07/2018 Name: Sara Zhang MRN: [id number] DOB: 29-Apr-1970  Referred by: Venita Lick, NP Reason for referral : Chronic Care Management (Medication Management)   Sara Zhang is a 48 y.o. year old female who is a primary care patient of Cannady, Barbaraann Faster, NP. The CCM team was consulted for assistance with chronic disease management and care coordination needs.    Received call from [facility] regarding medication changes made last week at patient's appointment with [facility].  Review of patient status, including review of consultants reports, relevant laboratory and other test results, and collaboration with appropriate care team members and the patient's provider was performed as part of comprehensive patient evaluation and provision of chronic care management services.    SDOH (Social Determinants of Health) screening performed today: Financial Strain . See Care Plan for related entries.   Advanced Directives Status: N See Care Plan and Vynca application for related entries.  Outpatient Encounter Medications as of 12/07/2018  Medication Sig  . atenolol (TENORMIN) 50 MG tablet TAKE 1 TABLET BY MOUTH ONCE DAILY  . atorvastatin (LIPITOR) 10 MG tablet TAKE 1 TABLET BY MOUTH EVERY DAY  . blood glucose meter kit and supplies KIT Dispense based on patient and insurance preference. Use up to four times daily as directed. (FOR ICD-9 250.00, 250.01).  . Calcium Carbonate-Vitamin D (OYSTER SHELL CALCIUM 500 + D) 500-125 MG-UNIT TABS Take by mouth daily.  . Exenatide ER (BYDUREON BCISE) 2 MG/0.85ML AUIJ Inject 2 mg into the skin once a week.  . ferrous sulfate 325 (65 FE) MG tablet Take 1 tablet (325 mg total) by mouth daily with breakfast.  . furosemide (LASIX) 40 MG tablet Take 1 tablet (40 mg total) by mouth 2 (two) times daily.  Marland Kitchen glipiZIDE (GLUCOTROL) 10 MG tablet TAKE 1 TABLET BY MOUTH TWICE A DAY WITH MEALS  . glucose  blood (CONTOUR NEXT TEST) test strip TEST BLOOD SUGAR ONCE DAILY  . Insulin Glargine (TOUJEO SOLOSTAR) 300 UNIT/ML SOPN Inject 54 Units into the skin at bedtime.  . Insulin Pen Needle 32G X 4 MM MISC 1 Units by Does not apply route every morning. Pen needles  . levonorgestrel (MIRENA) 20 MCG/24HR IUD 1 each by Intrauterine route once.  Marland Kitchen lisinopril (ZESTRIL) 40 MG tablet TAKE 1 TABLET BY MOUTH EVERY DAY  . OXYGEN Inhale 2 L into the lungs daily.  Marland Kitchen VITAMIN D PO Take by mouth daily.   No facility-administered encounter medications on file as of 12/07/2018.      Goals Addressed            This Visit's Progress     Patient Stated   . PharmD "I have a lot of medications" (pt-stated)       Current Barriers:  . Diabetes: controlled, though not on optimized regimen; most recent A1c 6.8% o Appointment w/ [facility] Endocrinology last week; received call today from the office that glipizide was discontinued and patient was given a prescription for Jardiance  . Current antihyperglycemic regimen: Toujeo 54 units, Bydureon 2 mg weekly, Jardiance 10 mg daily - pending  . Cardiovascular risk reduction- CHF, follows w/ Dr. Saunders Revel  o Current hypertensive regimen: atenolol 50 mg daily, furosemide 40 mg daily, lisinopril 40 mg daily o Current hyperlipidemia regimen: atorvastatin 10 mg daily; last LDL at goal <70  Pharmacist Clinical Goal(s):  Marland Kitchen Over the next 90 days, patient with work with PharmD and primary care provider  to address optimized glycemic management  Interventions: . Contacted patient to ask if she had been able to start Jardiance yet (should be affordable with her combination of Medicare + Medicaid); left message for her to return my call at her convenience.  Patient Self Care Activities:  . Patient will check blood glucose BID, document, and provide at future appointments . Patient will take medications as prescribed . Patient will report any questions or concerns to provider   Please see  past updates related to this goal by clicking on the "Past Updates" button in the selected goal          Plan:  - Will await return call from patient regarding medication access - If I do not hear back, will outreach patient in ~4 weeks as scheduled  Catie Darnelle Maffucci, PharmD Clinical Pharmacist [place] [phone]

## 2018-12-07 NOTE — Patient Instructions (Signed)
Visit Information  Goals Addressed            This Visit's Progress     Patient Stated   . PharmD "I have a lot of medications" (pt-stated)       Current Barriers:  . Diabetes: controlled, though not on optimized regimen; most recent A1c 6.8% o Appointment w/ [facility] Endocrinology last week; received call today from the office that glipizide was discontinued and patient was given a prescription for Jardiance  . Current antihyperglycemic regimen: Toujeo 54 units, Bydureon 2 mg weekly, Jardiance 10 mg daily - pending  . Cardiovascular risk reduction- CHF, follows w/ Dr. Saunders Revel  o Current hypertensive regimen: atenolol 50 mg daily, furosemide 40 mg daily, lisinopril 40 mg daily o Current hyperlipidemia regimen: atorvastatin 10 mg daily; last LDL at goal <70  Pharmacist Clinical Goal(s):  Marland Kitchen Over the next 90 days, patient with work with PharmD and primary care provider to address optimized glycemic management  Interventions: . Contacted patient to ask if she had been able to start Jardiance yet (should be affordable with her combination of Medicare + Medicaid); left message for her to return my call at her convenience.  Patient Self Care Activities:  . Patient will check blood glucose BID, document, and provide at future appointments . Patient will take medications as prescribed . Patient will report any questions or concerns to provider   Please see past updates related to this goal by clicking on the "Past Updates" button in the selected goal         The patient verbalized understanding of instructions provided today and declined a print copy of patient instruction materials.   Plan:  - Will await return call from patient regarding medication access - If I do not hear back, will outreach patient in ~4 weeks as scheduled  Catie Darnelle Maffucci, PharmD Clinical Pharmacist [place] [phone]

## 2018-12-22 ENCOUNTER — Telehealth: Payer: Self-pay

## 2019-01-11 ENCOUNTER — Other Ambulatory Visit: Payer: Self-pay | Admitting: Nurse Practitioner

## 2019-01-11 DIAGNOSIS — Z1231 Encounter for screening mammogram for malignant neoplasm of breast: Secondary | ICD-10-CM

## 2019-01-12 ENCOUNTER — Ambulatory Visit (INDEPENDENT_AMBULATORY_CARE_PROVIDER_SITE_OTHER): Payer: Medicare Other | Admitting: Pharmacist

## 2019-01-12 DIAGNOSIS — E119 Type 2 diabetes mellitus without complications: Secondary | ICD-10-CM | POA: Diagnosis not present

## 2019-01-12 DIAGNOSIS — Z794 Long term (current) use of insulin: Secondary | ICD-10-CM

## 2019-01-12 DIAGNOSIS — I5032 Chronic diastolic (congestive) heart failure: Secondary | ICD-10-CM

## 2019-01-12 NOTE — Chronic Care Management (AMB) (Signed)
Chronic Care Management   Follow Up Note   01/12/2019 Name: Sara Zhang MRN: [id number] DOB: 11-20-1970  Referred by: Venita Lick, NP Reason for referral : Chronic Care Management (Medication Management)   Sara Zhang is a 48 y.o. year old female who is a primary care patient of Cannady, Barbaraann Faster, NP. The CCM team was consulted for assistance with chronic disease management and care coordination needs.    Contacted patient for medication management review today.   Review of patient status, including review of consultants reports, relevant laboratory and other test results, and collaboration with appropriate care team members and the patient's provider was performed as part of comprehensive patient evaluation and provision of chronic care management services.    SDOH (Social Determinants of Health) screening performed today: None. See Care Plan for related entries.   Outpatient Encounter Medications as of 01/12/2019  Medication Sig  . atenolol (TENORMIN) 50 MG tablet TAKE 1 TABLET BY MOUTH ONCE DAILY  . atorvastatin (LIPITOR) 10 MG tablet Take 1 tablet (10 mg total) by mouth daily.  . blood glucose meter kit and supplies KIT Dispense based on patient and insurance preference. Use up to four times daily as directed. (FOR ICD-9 250.00, 250.01).  . Calcium Carbonate-Vitamin D (OYSTER SHELL CALCIUM 500 + D) 500-125 MG-UNIT TABS Take by mouth daily.  . Exenatide ER (BYDUREON BCISE) 2 MG/0.85ML AUIJ Inject 2 mg into the skin once a week.  . furosemide (LASIX) 40 MG tablet Take 1 tablet (40 mg total) by mouth 2 (two) times daily.  . Insulin Glargine (TOUJEO SOLOSTAR) 300 UNIT/ML SOPN Inject 54 Units into the skin at bedtime.  Marland Kitchen JARDIANCE 25 MG TABS tablet Take 25 mg by mouth daily.  Marland Kitchen levonorgestrel (MIRENA) 20 MCG/24HR IUD 1 each by Intrauterine route once.  Marland Kitchen lisinopril (ZESTRIL) 40 MG tablet TAKE 1 TABLET BY MOUTH EVERY DAY  . OXYGEN Inhale 2 L into the lungs daily.  Marland Kitchen VITAMIN  D PO Take by mouth daily.  . ferrous sulfate 325 (65 FE) MG tablet Take 1 tablet (325 mg total) by mouth daily with breakfast. (Patient not taking: Reported on 01/12/2019)  . glucose blood (CONTOUR NEXT TEST) test strip TEST BLOOD SUGAR ONCE DAILY  . Insulin Pen Needle 32G X 4 MM MISC 1 Units by Does not apply route every morning. Pen needles  . [DISCONTINUED] glipiZIDE (GLUCOTROL) 10 MG tablet TAKE 1 TABLET BY MOUTH TWICE A DAY WITH MEALS   No facility-administered encounter medications on file as of 01/12/2019.      Goals Addressed            This Visit's Progress     Patient Stated   . PharmD "I have a lot of medications" (pt-stated)       Current Barriers:  . Diabetes: controlled, most recent A1c 6.8% . Current antihyperglycemic regimen: Toujeo 54 units, Bydureon 2 mg weekly, Jardiance 25 mg daily  o Follows w/ [facility] endocrinology; recently had Jardiance started, glipizide d/c.  o Denies genitourinary complaints since starting Jardiance.  o Continues on Bydureon as  Medicaid does not cover another weekly GLP1 (only Victoza) . Denies episodes of hypoglycemia since d/c glipizide  . Current blood glucose readings:  o Fasting: this morning was 96; reports generally less than 130 o Evening: last night 145; reports generally less than 180 (except for post Thanksgiving) . Cardiovascular risk reduction- HFpEF, most recent EF 55-60% follows w/ Dr. Saunders Revel  o Current hypertensive regimen: atenolol 50  mg daily, furosemide 40 mg BID, lisinopril 40 mg daily o Current hyperlipidemia regimen: atorvastatin 10 mg daily; last LDL at goal <70  Pharmacist Clinical Goal(s):  Marland Kitchen Over the next 90 days, patient with work with PharmD and primary care provider to address optimized glycemic management  Interventions: . Comprehensive medication review performed, medication list updated in electronic medical record.  . Discussed goal A1c, goal fasting glucose and goal 2 hour post prandial glucose.  Marland Kitchen Discussed  potential benefit of SGLT2 in CHF (though patient has HFpEF, not HFrEF, hopefully SGLT2 therapy will continue to benefit her swelling).  . Reviewed upcoming appt w/ Dr. Saunders Revel.  . Reviewed outside fill hx; patient up to date on fills for RAAS, statin and DM medications.   Patient Self Care Activities:  . Patient will check blood glucose BID, document, and provide at future appointments . Patient will take medications as prescribed . Patient will report any questions or concerns to provider   Please see past updates related to this goal by clicking on the "Past Updates" button in the selected goal          Plan: - CCM team will outreach patient in the next 4-6 weeks - Scheduled medication management review in ~6-8 weeks   Catie Darnelle Maffucci, PharmD, [place] [phone]

## 2019-01-12 NOTE — Patient Instructions (Signed)
Visit Information  Goals Addressed            This Visit's Progress     Patient Stated   . PharmD "I have a lot of medications" (pt-stated)       Current Barriers:  . Diabetes: controlled, most recent A1c 6.8% . Current antihyperglycemic regimen: Toujeo 54 units, Bydureon 2 mg weekly, Jardiance 25 mg daily  o Follows w/ [facility] endocrinology; recently had Jardiance started, glipizide d/c.  o Denies genitourinary complaints since starting Jardiance.  o Continues on Bydureon as [place] Medicaid does not cover another weekly GLP1 (only Victoza) . Denies episodes of hypoglycemia since d/c glipizide  . Current blood glucose readings (did not have meter for review with her during our phone call today): o Fasting: this morning was 96; reports generally less than 130 o Evening: last night 145, though reports she had a candy bar ~30 minutes before checking BG; reports generally less than 180 (except for post Thanksgiving) . Cardiovascular risk reduction- HFpEF, most recent EF 55-60% follows w/ Dr. Saunders Revel  o Current hypertensive regimen: atenolol 50 mg daily, furosemide 40 mg BID, lisinopril 40 mg daily o Current hyperlipidemia regimen: atorvastatin 10 mg daily; last LDL at goal <70  Pharmacist Clinical Goal(s):  Marland Kitchen Over the next 90 days, patient with work with PharmD and primary care provider to address optimized glycemic management  Interventions: . Comprehensive medication review performed, medication list updated in electronic medical record.  . Discussed goal A1c, goal fasting glucose and goal 2 hour post prandial glucose.  Marland Kitchen Discussed potential benefit of SGLT2 in CHF (though patient has HFpEF, not HFrEF, hopefully SGLT2 therapy will continue to benefit her swelling).  . Reviewed upcoming appt w/ Dr. Saunders Revel.  . Reviewed outside fill hx; patient up to date on fills for RAAS, statin and DM medications.   Patient Self Care Activities:  . Patient will check blood glucose BID, document, and provide at  future appointments . Patient will take medications as prescribed . Patient will report any questions or concerns to provider   Please see past updates related to this goal by clicking on the "Past Updates" button in the selected goal         The patient verbalized understanding of instructions provided today and declined a print copy of patient instruction materials.   Plan: - CCM team will outreach patient in the next 4-6 weeks - Scheduled medication management review in ~6-8 weeks   Catie Darnelle Maffucci, PharmD, [place] [phone]

## 2019-01-14 ENCOUNTER — Other Ambulatory Visit: Payer: Self-pay | Admitting: Unknown Physician Specialty

## 2019-01-15 ENCOUNTER — Ambulatory Visit
Admission: RE | Admit: 2019-01-15 | Discharge: 2019-01-15 | Disposition: A | Discharge status: Home / Self Care | Payer: Medicare Other | Source: Ambulatory Visit | Attending: Nurse Practitioner | Admitting: Nurse Practitioner

## 2019-01-15 DIAGNOSIS — Z1231 Encounter for screening mammogram for malignant neoplasm of breast: Secondary | ICD-10-CM | POA: Diagnosis not present

## 2019-01-20 ENCOUNTER — Ambulatory Visit (INDEPENDENT_AMBULATORY_CARE_PROVIDER_SITE_OTHER): Payer: Medicare Other | Admitting: Internal Medicine

## 2019-01-20 ENCOUNTER — Encounter: Payer: Self-pay | Admitting: Internal Medicine

## 2019-01-20 ENCOUNTER — Other Ambulatory Visit: Payer: Self-pay

## 2019-01-20 VITALS — BP 134/84 | HR 81 | Ht 69.0 in | Wt 373.8 lb

## 2019-01-20 DIAGNOSIS — I10 Essential (primary) hypertension: Secondary | ICD-10-CM | POA: Diagnosis not present

## 2019-01-20 DIAGNOSIS — I5032 Chronic diastolic (congestive) heart failure: Secondary | ICD-10-CM

## 2019-01-20 DIAGNOSIS — I428 Other cardiomyopathies: Secondary | ICD-10-CM

## 2019-01-20 MED ORDER — AMLODIPINE BESYLATE 5 MG PO TABS: 5.0000 mg | ORAL_TABLET | Freq: Every day | ORAL | 5 refills | Status: DC

## 2019-01-20 NOTE — Patient Instructions (Signed)
Medication Instructions:  Your physician has recommended you make the following change in your medication:  1- START Amlodipine 5 mg by mouth once a day.  *If you need a refill on your cardiac medications before your next appointment, please call your pharmacy*  Lab Work: none If you have labs (blood work) drawn today and your tests are completely normal, you will receive your results only by: Marland Kitchen MyChart Message (if you have MyChart) OR . A paper copy in the mail If you have any lab test that is abnormal or we need to change your treatment, we will call you to review the results.  Testing/Procedures: none  Follow-Up: At [facility], you and your health needs are our priority.  As part of our continuing mission to provide you with exceptional heart care, we have created designated Provider Care Teams.  These Care Teams include your primary Cardiologist (physician) and Advanced Practice Providers (APPs -  Physician Assistants and Nurse Practitioners) who all work together to provide you with the care you need, when you need it.  Your next appointment:   3-4 month(s)  The format for your next appointment:   In Person  Provider:    You may see Nelva Bush, MD or one of the following Advanced Practice Providers on your designated Care Team:    Murray Hodgkins, NP  Christell Faith, PA-C  Marrianne Mood, PA-C

## 2019-01-20 NOTE — Progress Notes (Signed)
Follow-up Outpatient Visit Date: 01/20/2019  Primary Care Provider: Venita Lick, NP [place] [address]  Chief Complaint: Follow-up heart failure  HPI:  Sara Zhang is a 48 y.o. female with history of heart failure (LVEF reportedly 25-35% in 2014 with subsequent normalization), HTN, HLD, OSA on CPAP, and type II DM, who presents for follow-up of heart failure.  I last saw her in mid September at which time she reported increased leg edema and weight gain.  She reported not taking her furosemide twice daily as prescribed on a regular basis due to urinary frequency.  Subsequent echo showed normal LVEF and diastolic function.  Today, Sara Zhang reports feeling well with improved leg swelling and shortness of breath.  She is using furosemide twice daily most days.  She continues to use supplemental oxygen intermittently.  She denies chest pain, orthopnea, PND, palpitations, and lightheadedness.  She has not modified her diet or started to exercise but has lost weight since our last visit.  --------------------------------------------------------------------------------------------------  Cardiovascular History & Procedures: Cardiovascular Problems:  Chronic systolic heart failure with normalization of LVEF  Risk Factors:  Hypertension, hyperlipidemia, diabetes mellitus, and obesity  Cath/PCI:  None  CV Surgery:  None  EP Procedures and Devices:  None  Non-Invasive Evaluation(s):  TTE (11/23/2018): Normal LV size with mild LVH.  LVEF 55-60% with normal diastolic function.  Normal RV size and function.  Mild biatrial enlargement.  No significant valvular abnormality.  TTE (06/10/16): Normal LV size and function with LVEF of 55-60%. Normal diastolic parameters. Mild MR. Normal RV size and function. Normal pulmonary artery pressure.  Report of LVEF 25-35% with global hypokinesis by outside echo on her before 03/04/12.  Recent CV Pertinent Labs: Lab  Results  Component Value Date   CHOL 113 08/27/2018   HDL 38 (L) 08/20/2017   LDLCALC 52 08/20/2017   TRIG 69 08/27/2018   BNP 51.0 12/16/2015   K 3.9 12/01/2018   K 4.2 09/25/2013   BUN 14 12/01/2018   BUN 12 09/25/2013   CREATININE 0.94 12/01/2018   CREATININE 0.75 09/25/2013    Past medical and surgical history were reviewed and updated in EPIC.  Current Meds  Medication Sig  . atenolol (TENORMIN) 50 MG tablet TAKE 1 TABLET BY MOUTH ONCE DAILY  . atorvastatin (LIPITOR) 10 MG tablet Take 1 tablet (10 mg total) by mouth daily.  . blood glucose meter kit and supplies KIT Dispense based on patient and insurance preference. Use up to four times daily as directed. (FOR ICD-9 250.00, 250.01).  Marland Kitchen BYDUREON BCISE 2 MG/0.85ML AUIJ INJECT 2 MG UNDER THE SKIN EVERY WEEK  . Calcium Carbonate-Vitamin D (OYSTER SHELL CALCIUM 500 + D) 500-125 MG-UNIT TABS Take by mouth daily.  . furosemide (LASIX) 40 MG tablet Take 1 tablet (40 mg total) by mouth 2 (two) times daily.  Marland Kitchen glucose blood (CONTOUR NEXT TEST) test strip TEST BLOOD SUGAR ONCE DAILY  . Insulin Glargine (TOUJEO SOLOSTAR) 300 UNIT/ML SOPN Inject 54 Units into the skin at bedtime.  . Insulin Pen Needle 32G X 4 MM MISC 1 Units by Does not apply route every morning. Pen needles  . JARDIANCE 25 MG TABS tablet Take 25 mg by mouth daily.  Marland Kitchen levonorgestrel (MIRENA) 20 MCG/24HR IUD 1 each by Intrauterine route once.  Marland Kitchen lisinopril (ZESTRIL) 40 MG tablet TAKE 1 TABLET BY MOUTH EVERY DAY  . OXYGEN Inhale 2 L into the lungs daily.  Marland Kitchen VITAMIN D PO Take by  mouth daily.    Allergies: Patient has no known allergies.  Social History   Tobacco Use  . Smoking status: Never Smoker  . Smokeless tobacco: Never Used  Substance Use Topics  . Alcohol use: Yes    Alcohol/week: 0.0 standard drinks    Comment: 1-2 beers/month  . Drug use: No    Family History  Problem Relation Age of Onset  . Diabetes Mother   . Hyperlipidemia Mother   .  Hypertension Mother   . Stroke Mother   . Arthritis Father   . Asthma Father   . Diabetes Father   . Hypertension Father   . Hypertension Brother   . Diabetes Brother   . Diabetes Maternal Grandmother   . Hypertension Maternal Grandmother   . Diabetes Maternal Grandfather   . Hypertension Maternal Grandfather   . Diabetes Paternal Grandmother   . Hypertension Paternal Grandmother   . Heart attack Maternal Uncle   . Breast cancer Neg Hx     Review of Systems: A 12-system review of systems was performed and was negative except as noted in the HPI.  --------------------------------------------------------------------------------------------------  Physical Exam: BP 134/84 (BP Location: Left Arm, Patient Position: Sitting, Cuff Size: Large)   Pulse 81   Ht 5' 9"  (1.753 m)   Wt (!) 373 lb 12 oz (169.5 kg)   SpO2 93%   BMI 55.19 kg/m   General:  NAD> HEENT: No conjunctival pallor or scleral icterus. Facemask in place. Neck: Supple without lymphadenopathy, thyromegaly, JVD, or HJR, though evaluation is limited by body habitus. Lungs: Normal work of breathing. Clear to auscultation bilaterally without wheezes or crackles. Heart: Regular rate and rhythm without murmurs, rubs, or gallops. Abd: Bowel sounds present. Obese, NT/ND. Ext: No lower extremity edema. Skin: Warm and dry without rash.  EKG:  NSR with low voltage and nonspecific ST/T changes in the setting of baseline wander.  No significant change from prior tracing on 10/22/2018.  Lab Results  Component Value Date   WBC 8.9 08/20/2017   HGB 10.3 (L) 08/20/2017   HCT 34.8 08/20/2017   MCV 83 08/20/2017   PLT 382 08/20/2017    Lab Results  Component Value Date   NA 140 12/01/2018   K 3.9 12/01/2018   CL 102 12/01/2018   CO2 26 12/01/2018   BUN 14 12/01/2018   CREATININE 0.94 12/01/2018   GLUCOSE 81 12/01/2018   ALT 11 12/01/2018    Lab Results  Component Value Date   CHOL 113 08/27/2018   HDL 38 (L)  08/20/2017   LDLCALC 52 08/20/2017   TRIG 69 08/27/2018    --------------------------------------------------------------------------------------------------  ASSESSMENT AND PLAN: Chronic HFpEF and NICM: Sara Zhang appears grossly euvolemic with NYHA class II-III symptoms.  Weight is down about 16 pound since our last visit.  No medication changes today.  Hypertension: BP mildly elevated (goal < 130/80).  I have recommended adding amlodipine 5 mg daily.  No other medication changes today.  Morbid obesity: BMI > 55 with multiple comorbidities.  Weight loss encouraged through diet and exercise.  Follow-up: Return to clinic in 3-4 months.  Nelva Bush, MD 01/20/2019 9:36 AM

## 2019-01-21 ENCOUNTER — Encounter: Payer: Self-pay | Admitting: Internal Medicine

## 2019-02-01 ENCOUNTER — Other Ambulatory Visit: Payer: Self-pay | Admitting: Internal Medicine

## 2019-02-02 ENCOUNTER — Other Ambulatory Visit: Payer: Self-pay

## 2019-02-02 ENCOUNTER — Emergency Department
Admission: EM | Admit: 2019-02-02 | Discharge: 2019-02-03 | Disposition: A | Discharge status: Other Acute Inpatient Hospital | Payer: Medicare Other | Attending: Emergency Medicine | Admitting: Emergency Medicine

## 2019-02-02 ENCOUNTER — Emergency Department: Payer: Medicare Other

## 2019-02-02 DIAGNOSIS — R509 Fever, unspecified: Secondary | ICD-10-CM | POA: Diagnosis not present

## 2019-02-02 DIAGNOSIS — R0902 Hypoxemia: Secondary | ICD-10-CM | POA: Diagnosis not present

## 2019-02-02 DIAGNOSIS — I5032 Chronic diastolic (congestive) heart failure: Secondary | ICD-10-CM | POA: Insufficient documentation

## 2019-02-02 DIAGNOSIS — Z79899 Other long term (current) drug therapy: Secondary | ICD-10-CM | POA: Diagnosis not present

## 2019-02-02 DIAGNOSIS — R0602 Shortness of breath: Secondary | ICD-10-CM | POA: Diagnosis present

## 2019-02-02 DIAGNOSIS — E1122 Type 2 diabetes mellitus with diabetic chronic kidney disease: Secondary | ICD-10-CM | POA: Insufficient documentation

## 2019-02-02 DIAGNOSIS — N182 Chronic kidney disease, stage 2 (mild): Secondary | ICD-10-CM | POA: Insufficient documentation

## 2019-02-02 DIAGNOSIS — Z794 Long term (current) use of insulin: Secondary | ICD-10-CM | POA: Diagnosis not present

## 2019-02-02 DIAGNOSIS — I130 Hypertensive heart and chronic kidney disease with heart failure and stage 1 through stage 4 chronic kidney disease, or unspecified chronic kidney disease: Secondary | ICD-10-CM | POA: Insufficient documentation

## 2019-02-02 DIAGNOSIS — R05 Cough: Secondary | ICD-10-CM | POA: Diagnosis not present

## 2019-02-02 DIAGNOSIS — U071 COVID-19: Secondary | ICD-10-CM | POA: Diagnosis not present

## 2019-02-02 LAB — CBC
HCT: 37.1 % (ref 36.0–46.0)
Hemoglobin: 11.5 g/dL — ABNORMAL LOW (ref 12.0–15.0)
MCH: 25.2 pg — ABNORMAL LOW (ref 26.0–34.0)
MCHC: 31 g/dL (ref 30.0–36.0)
MCV: 81.4 fL (ref 80.0–100.0)
Platelets: 308 10*3/uL (ref 150–400)
RBC: 4.56 MIL/uL (ref 3.87–5.11)
RDW: 15.5 % (ref 11.5–15.5)
WBC: 6.8 10*3/uL (ref 4.0–10.5)
nRBC: 0 % (ref 0.0–0.2)

## 2019-02-02 LAB — FIBRIN DERIVATIVES D-DIMER (ARMC ONLY): Fibrin derivatives D-dimer (ARMC): 1446.77 ng/mL (FEU) — ABNORMAL HIGH (ref 0.00–499.00)

## 2019-02-02 LAB — COMPREHENSIVE METABOLIC PANEL
ALT: 18 U/L (ref 0–44)
AST: 24 U/L (ref 15–41)
Albumin: 3.2 g/dL — ABNORMAL LOW (ref 3.5–5.0)
Alkaline Phosphatase: 72 U/L (ref 38–126)
Anion gap: 12 (ref 5–15)
BUN: 19 mg/dL (ref 6–20)
CO2: 25 mmol/L (ref 22–32)
Calcium: 7.4 mg/dL — ABNORMAL LOW (ref 8.9–10.3)
Chloride: 102 mmol/L (ref 98–111)
Creatinine, Ser: 1.37 mg/dL — ABNORMAL HIGH (ref 0.44–1.00)
GFR calc Af Amer: 52 mL/min — ABNORMAL LOW (ref >60)
GFR calc non Af Amer: 45 mL/min — ABNORMAL LOW (ref >60)
Glucose, Bld: 169 mg/dL — ABNORMAL HIGH (ref 70–99)
Potassium: 3.4 mmol/L — ABNORMAL LOW (ref 3.5–5.1)
Sodium: 139 mmol/L (ref 135–145)
Total Bilirubin: 0.8 mg/dL (ref 0.3–1.2)
Total Protein: 7.9 g/dL (ref 6.5–8.1)

## 2019-02-02 LAB — FIBRINOGEN: Fibrinogen: 728 mg/dL — ABNORMAL HIGH (ref 210–475)

## 2019-02-02 LAB — POC SARS CORONAVIRUS 2 AG: SARS Coronavirus 2 Ag: POSITIVE — AB

## 2019-02-02 MED ORDER — ACETAMINOPHEN 500 MG PO TABS
1000.0000 mg | ORAL_TABLET | Freq: Once | ORAL | Status: AC
  Administered 2019-02-02: 1000 mg via ORAL
  Filled 2019-02-02: qty 2

## 2019-02-02 MED ORDER — HYDROCOD POLST-CPM POLST ER 10-8 MG/5ML PO SUER
5.0000 mL | Freq: Once | ORAL | Status: AC
  Administered 2019-02-02: 5 mL via ORAL
  Filled 2019-02-02: qty 5

## 2019-02-02 MED ORDER — DEXAMETHASONE SODIUM PHOSPHATE 10 MG/ML IJ SOLN
10.0000 mg | Freq: Once | INTRAMUSCULAR | Status: AC
  Administered 2019-02-02: 10 mg via INTRAVENOUS
  Filled 2019-02-02: qty 1

## 2019-02-02 MED ORDER — SODIUM CHLORIDE 0.9 % IV BOLUS
1000.0000 mL | Freq: Once | INTRAVENOUS | Status: AC
  Administered 2019-02-02: 1000 mL via INTRAVENOUS

## 2019-02-02 NOTE — Plan of Care (Signed)
Sara Zhang YIR:[id number] DOB: January 25, 1971 DOA: 02/02/2019     PCP: Venita Lick, NP   Outpatient Specialists:   CARDS:   Dr. END    Patient arrived to ER on 02/02/19 at 2055  Patient coming from: home   Chief Complaint:   Chief Complaint  Patient presents with  . Shortness of Breath    HPI: Sara Zhang is a 47 y.o. female with medical history significant of CKD, diabetes, obesity, hypertension, hyperlipidemia  On chronic O2 2L, diastolic CHF    Presented with  5 day hx of  cough fever and shortness of breath.   Fever 100.6 Mid 90's on 4L In Emergency required up to 5 L of O2  Infectious risk factors:  Reports  fever, shortness of breath, dry cough,    In  ER RAPID COVID TEST    POSITIVE,     No results found for: SARSCOV2NAA   Regarding pertinent Chronic problems:     Hyperlipidemia -  on statins Lipitor   HTN on Norvasc atenolol   chronic CHF diastolic/systolic/ combined - last echo 11/23/2018  is 55 to 60%. The left ventricle has normal function. Normal left ventricular size. There is mildly increased left ventricular hypertrophy      DM 2 -  Lab Results  Component Value Date   HGBA1C 6.8 12/01/2018   on insulin,       Morbid obesity-   BMI Readings from Last 1 Encounters:  02/02/19 55.02 kg/m     CKD stage III - baseline Cr 1.2 Lab Results  Component Value Date   CREATININE 1.37 (H) 02/02/2019   CREATININE 0.94 12/01/2018   CREATININE 1.29 (H) 08/27/2018     While in ER: Creatinine slightly elevated at 1.32 X-ray showing diffuse bilateral pulmonary opacities Inflammatory markers ordered but still pending The following Work up has been ordered so far:  Orders Placed This Encounter  Procedures  . DG Chest Portable 1 View  . CBC  . CMP  . Ferritin  . Lactate dehydrogenase  . Procalcitonin  . Fibrin derivatives D-Dimer  . Fibrinogen  . Triglycerides  . C-reactive protein  . Consult to hospitalist  ALL PATIENTS BEING  ADMITTED/HAVING PROCEDURES NEED COVID-19 SCREENING  . Airborne and Contact precautions  . POC SARS Coronavirus 2 Ag-ED - Nasal Swab (BD Veritor Kit)  . POC SARS Coronavirus 2 Ag     Following Medications were ordered in ER: Medications  acetaminophen (TYLENOL) tablet 1,000 mg (1,000 mg Oral Given 02/02/19 2144)  chlorpheniramine-HYDROcodone (TUSSIONEX) 10-8 MG/5ML suspension 5 mL (5 mLs Oral Given 02/02/19 2144)  sodium chloride 0.9 % bolus 1,000 mL (1,000 mLs Intravenous New Bag/Given 02/02/19 2144)  dexamethasone (DECADRON) injection 10 mg (10 mg Intravenous Given 02/02/19 2240)        Consult Orders  (From admission, onward)         Start     Ordered   02/02/19 2226  Consult to hospitalist  ALL PATIENTS BEING ADMITTED/HAVING PROCEDURES NEED COVID-19 SCREENING  Once    Comments: ALL PATIENTS BEING ADMITTED/HAVING PROCEDURES NEED COVID-19 SCREENING  Provider:  (Not yet assigned)  Question Answer Comment  Place call to: [facility]   Reason for Consult Admit   Diagnosis/Clinical Info for Consult: COVID 5L [place] (normally 2L).  Bilateral opacities.  +comorbidities      02/02/19 2226            Significant initial  Findings: Abnormal Labs Reviewed  CBC - Abnormal;  Notable for the following components:      Result Value   Hemoglobin 11.5 (*)    MCH 25.2 (*)    All other components within normal limits  COMPREHENSIVE METABOLIC PANEL - Abnormal; Notable for the following components:   Potassium 3.4 (*)    Glucose, Bld 169 (*)    Creatinine, Ser 1.37 (*)    Calcium 7.4 (*)    Albumin 3.2 (*)    GFR calc non Af Amer 45 (*)    GFR calc Af Amer 52 (*)    All other components within normal limits  POC SARS CORONAVIRUS 2 AG - Abnormal; Notable for the following components:   SARS Coronavirus 2 Ag POSITIVE (*)    All other components within normal limits    Otherwise labs showing:    Recent Labs  Lab 02/02/19 2139  NA 139  K 3.4*  CO2 25  GLUCOSE 169*  BUN 19    CREATININE 1.37*  CALCIUM 7.4*    Cr   stable,    Lab Results  Component Value Date   CREATININE 1.37 (H) 02/02/2019   CREATININE 0.94 12/01/2018   CREATININE 1.29 (H) 08/27/2018    Recent Labs  Lab 02/02/19 2139  AST 24  ALT 18  ALKPHOS 72  BILITOT 0.8  PROT 7.9  ALBUMIN 3.2*   Lab Results  Component Value Date   CALCIUM 7.4 (L) 02/02/2019    WBC      Component Value Date/Time   WBC 6.8 02/02/2019 2139   ANC    Component Value Date/Time   NEUTROABS 5.9 08/20/2017 0956   NEUTROABS 5.3 09/25/2013 1400   ALC No components found for: LYMPHAB    Plt: Lab Results  Component Value Date   PLT 308 02/02/2019      COVID-19 Labs  No results for input(s): DDIMER, FERRITIN, LDH, CRP in the last 72 hours.  No results found for: SARSCOV2NAA   HG/HCT  stable,      Component Value Date/Time   HGB 11.5 (L) 02/02/2019 2139   HGB 10.3 (L) 08/20/2017 0956   HCT 37.1 02/02/2019 2139   HCT 34.8 08/20/2017 0956      ECG:not  Ordered     DM  labs:  HbA1C: Recent Labs    02/24/18 0951 08/27/18 1100 12/01/18 0938  HGBA1C 7.9* 7.1* 6.8      Ordered      ED Triage Vitals  Enc Vitals Group     BP 02/02/19 2124 (!) 118/95     Pulse Rate 02/02/19 2124 100     Resp 02/02/19 2124 (!) 28     Temp 02/02/19 2124 (!) 100.6 F (38.1 C)     Temp Source 02/02/19 2124 Oral     SpO2 02/02/19 2124 (!) 89 %     Weight 02/02/19 2125 (!) 372 lb 9.2 oz (169 kg)     Height 02/02/19 2125 _0  (1.753 m)     Head Circumference --      Peak Flow --      Pain Score 02/02/19 2125 0     Pain Loc --      Pain Edu? --      Excl. in [place]? --   TMAX(24)@       Latest  Blood pressure 104/61, pulse 84, temperature 99.8 F (37.7 C), temperature source Oral, resp. rate 20, height _1  (1.753 m), weight (!) 169 kg, SpO2 94 %.     Past Medical  History:   Past Medical History:  Diagnosis Date  . Abnormal uterine bleeding (AUB) 11/25/2016  . Chronic diastolic CHF  (congestive heart failure) ([place])    a. 02/2012 Echo: EF 25-35%, glob HK; b. 06/2016 Echo: EF 55-60%, mild MR, nl PASP.  Marland Kitchen CKD (chronic kidney disease), stage II   . Diabetes mellitus without complication ([place])   . Extreme obesity   . Hyperlipidemia   . Hypertension   . Hypoxia   . Microcytic anemia   . Sleep apnea   . Status post endometrial ablation 06/20/2016   2015 hysteroscopy/D&C with NovaSure endometrial ablation; pathology-benign endometrial polyps  . Thickened endometrium 06/20/2016   Ultrasound-22.3 mm endometrial stripe, heterogenous  . Vaginal bleeding    a. 01/2017 s/p hyteroscopy and polypectomy [facility]).  . Vitamin D deficiency       Past Surgical History:  Procedure Laterality Date  . CESAREAN SECTION    . CHOLECYSTECTOMY    . DILATION AND CURETTAGE, DIAGNOSTIC / THERAPEUTIC    . TONSILLECTOMY AND ADENOIDECTOMY    . TOOTH EXTRACTION  01/2016    Family History:   Family History  Problem Relation Age of Onset  . Diabetes Mother   . Hyperlipidemia Mother   . Hypertension Mother   . Stroke Mother   . Arthritis Father   . Asthma Father   . Diabetes Father   . Hypertension Father   . Hypertension Brother   . Diabetes Brother   . Diabetes Maternal Grandmother   . Hypertension Maternal Grandmother   . Diabetes Maternal Grandfather   . Hypertension Maternal Grandfather   . Diabetes Paternal Grandmother   . Hypertension Paternal Grandmother   . Heart attack Maternal Uncle   . Breast cancer Neg Hx     Allergies: No Known Allergies   Prior to Admission medications   Medication Sig Start Date End Date Taking? Authorizing Provider  amLODipine (NORVASC) 5 MG tablet Take 1 tablet (5 mg total) by mouth daily. 01/20/19 04/20/19  End, Harrell Gave, MD  atenolol (TENORMIN) 50 MG tablet TAKE 1 TABLET BY MOUTH ONCE DAILY 08/31/18   End, Harrell Gave, MD  atorvastatin (LIPITOR) 10 MG tablet Take 1 tablet (10 mg total) by mouth daily. 12/07/18   Marnee Guarneri T, NP  blood  glucose meter kit and supplies KIT Dispense based on patient and insurance preference. Use up to four times daily as directed. (FOR ICD-9 250.00, 250.01). 11/18/17   Cannady, Jolene T, NP  BYDUREON BCISE 2 MG/0.85ML AUIJ INJECT 2 MG UNDER THE SKIN EVERY WEEK 01/14/19   Cannady, Jolene T, NP  Calcium Carbonate-Vitamin D (OYSTER SHELL CALCIUM 500 + D) 500-125 MG-UNIT TABS Take by mouth daily.    [provider]  furosemide (LASIX) 40 MG tablet Take 1 tablet (40 mg total) by mouth 2 (two) times daily. 11/02/18 01/31/19  Theora Gianotti, NP  glucose blood (CONTOUR NEXT TEST) test strip TEST BLOOD SUGAR ONCE DAILY 01/09/18   Cannady, Henrine Screws T, NP  Insulin Glargine (TOUJEO SOLOSTAR) 300 UNIT/ML SOPN Inject 54 Units into the skin at bedtime. 11/18/17   Cannady, Henrine Screws T, NP  Insulin Pen Needle 32G X 4 MM MISC 1 Units by Does not apply route every morning. Pen needles 08/21/16   Kathrine Haddock, NP  JARDIANCE 25 MG TABS tablet Take 25 mg by mouth daily. 12/27/18   [provider]  levonorgestrel (MIRENA) 20 MCG/24HR IUD 1 each by Intrauterine route once.    [provider]  lisinopril (ZESTRIL) 40 MG  tablet TAKE 1 TABLET BY MOUTH EVERY DAY 02/01/19   End, Harrell Gave, MD  OXYGEN Inhale 2 L into the lungs daily.    [provider]  VITAMIN D PO Take by mouth daily.    [provider]    All other LABS:     Recent Labs  Lab 02/02/19 2139  WBC 6.8  HGB 11.5*  HCT 37.1  MCV 81.4  PLT 308     Recent Labs  Lab 02/02/19 2139  NA 139  K 3.4*  CL 102  CO2 25  GLUCOSE 169*  BUN 19  CREATININE 1.37*  CALCIUM 7.4*     Recent Labs  Lab 02/02/19 2139  AST 24  ALT 18  ALKPHOS 72  BILITOT 0.8  PROT 7.9  ALBUMIN 3.2*       Cultures:    Component Value Date/Time   SDES URINE, RANDOM 12/16/2015 1332   SPECREQUEST NONE 12/16/2015 1332   CULT (A) 12/16/2015 1332    <10,000 COLONIES/mL INSIGNIFICANT GROWTH Performed at [facility] 12/17/2015 FINAL 12/16/2015 1332     Radiological Exams on Admission: DG Chest Portable 1 View  Result Date: 02/02/2019 CLINICAL DATA:  Cough and fever. EXAM: PORTABLE CHEST 1 VIEW COMPARISON:  05/23/2006 FINDINGS: In the cardiac silhouette is significantly enlarged. There are pulmonary opacities bilaterally. There may be small bilateral pleural effusions. There is no pneumothorax. There is no acute osseous abnormality. IMPRESSION: Diffuse bilateral pulmonary opacities concerning for an atypical infectious process such as viral pneumonia or developing pulmonary edema. Electronically Signed   By: Constance Holster M.D.   On: 02/02/2019 21:50    Chart has been reviewed    Assessment/Plan  48 y.o. female with medical history significant of CKD, diabetes, obesity, hypertension, hyperlipidemia  On chronic O2 2L, diastolic CHF transferred to [facility] for Covid infection resulting in acute respiratory failure  Discussed with Dr. Sidonie Dickens Sabastion Hrdlicka 02/02/2019, 11:08 PM    [facility]     after 2 AM please page floor coverage PA If 7AM-7PM, please contact the day team taking care of the patient using [email or web address]

## 2019-02-02 NOTE — ED Provider Notes (Addendum)
[facility] Emergency Department Provider Note  Time seen: 9:34 PM  I have reviewed the triage vital signs and the nursing notes.   HISTORY  Chief Complaint Shortness of Breath   HPI Sara Zhang is a 48 y.o. female with a past medical history of CKD, diabetes, obesity, hypertension, hyperlipidemia, presents emergency department for cough fever and shortness of breath.  According to the patient for the last 5 days she has been experiencing intermittent cough congestion with mild shortness of breath.  Today she felt very fatigued and weak found to have a fever in the emergency department 100.6.   Patient wears 2 L of oxygen 24/7, currently 89% on 2 L satting in the mid 90s on 4 L.  Past Medical History:  Diagnosis Date  . Abnormal uterine bleeding (AUB) 11/25/2016  . Chronic diastolic CHF (congestive heart failure) ([place])    a. 02/2012 Echo: EF 25-35%, glob HK; b. 06/2016 Echo: EF 55-60%, mild MR, nl PASP.  Marland Kitchen CKD (chronic kidney disease), stage II   . Diabetes mellitus without complication ([place])   . Extreme obesity   . Hyperlipidemia   . Hypertension   . Hypoxia   . Microcytic anemia   . Sleep apnea   . Status post endometrial ablation 06/20/2016   2015 hysteroscopy/D&C with NovaSure endometrial ablation; pathology-benign endometrial polyps  . Thickened endometrium 06/20/2016   Ultrasound-22.3 mm endometrial stripe, heterogenous  . Vaginal bleeding    a. 01/2017 s/p hyteroscopy and polypectomy [facility]).  . Vitamin D deficiency     Patient Active Problem List   Diagnosis Date Noted  . Chronic heart failure with preserved ejection fraction ([place]) 10/22/2018  . NICM (nonischemic cardiomyopathy) ([place]) 10/22/2018  . Lower extremity edema 10/22/2018  . BMI 50.0-59.9, adult ([place]) 11/18/2017  . Elevated TSH 12/09/2016  . Chronic diastolic heart failure ([place]) 10/30/2016  . Essential hypertension 10/30/2016  . Iron deficiency anemia due to chronic blood loss  05/27/2016  . Type 2 diabetes mellitus treated with insulin ([place]) 04/25/2015  . Hyperlipidemia associated with type 2 diabetes mellitus ([place]) 11/10/2014  . Obstructive sleep apnea 11/10/2014  . Morbid obesity ([place]) 11/10/2014  . Vitamin D deficiency 11/10/2014  . CKD (chronic kidney disease), stage II 11/10/2014  . Benign hypertensive heart and kidney disease with CHF and stage 2 chronic kidney disease ([place]) 11/10/2014    Past Surgical History:  Procedure Laterality Date  . CESAREAN SECTION    . CHOLECYSTECTOMY    . DILATION AND CURETTAGE, DIAGNOSTIC / THERAPEUTIC    . TONSILLECTOMY AND ADENOIDECTOMY    . TOOTH EXTRACTION  01/2016    Prior to Admission medications   Medication Sig Start Date End Date Taking? Authorizing Provider  amLODipine (NORVASC) 5 MG tablet Take 1 tablet (5 mg total) by mouth daily. 01/20/19 04/20/19  End, Harrell Gave, MD  atenolol (TENORMIN) 50 MG tablet TAKE 1 TABLET BY MOUTH ONCE DAILY 08/31/18   End, Harrell Gave, MD  atorvastatin (LIPITOR) 10 MG tablet Take 1 tablet (10 mg total) by mouth daily. 12/07/18   Marnee Guarneri T, NP  blood glucose meter kit and supplies KIT Dispense based on patient and insurance preference. Use up to four times daily as directed. (FOR ICD-9 250.00, 250.01). 11/18/17   Cannady, Jolene T, NP  BYDUREON BCISE 2 MG/0.85ML AUIJ INJECT 2 MG UNDER THE SKIN EVERY WEEK 01/14/19   Cannady, Jolene T, NP  Calcium Carbonate-Vitamin D (OYSTER SHELL CALCIUM 500 + D) 500-125 MG-UNIT TABS Take by mouth daily.  [provider]  furosemide (LASIX) 40 MG tablet Take 1 tablet (40 mg total) by mouth 2 (two) times daily. 11/02/18 01/31/19  Theora Gianotti, NP  glucose blood (CONTOUR NEXT TEST) test strip TEST BLOOD SUGAR ONCE DAILY 01/09/18   Cannady, Henrine Screws T, NP  Insulin Glargine (TOUJEO SOLOSTAR) 300 UNIT/ML SOPN Inject 54 Units into the skin at bedtime. 11/18/17   Cannady, Henrine Screws T, NP  Insulin Pen Needle 32G X 4 MM MISC 1 Units by  Does not apply route every morning. Pen needles 08/21/16   Kathrine Haddock, NP  JARDIANCE 25 MG TABS tablet Take 25 mg by mouth daily. 12/27/18   [provider]  levonorgestrel (MIRENA) 20 MCG/24HR IUD 1 each by Intrauterine route once.    [provider]  lisinopril (ZESTRIL) 40 MG tablet TAKE 1 TABLET BY MOUTH EVERY DAY 02/01/19   End, Harrell Gave, MD  OXYGEN Inhale 2 L into the lungs daily.    [provider]  VITAMIN D PO Take by mouth daily.    [provider]    No Known Allergies  Family History  Problem Relation Age of Onset  . Diabetes Mother   . Hyperlipidemia Mother   . Hypertension Mother   . Stroke Mother   . Arthritis Father   . Asthma Father   . Diabetes Father   . Hypertension Father   . Hypertension Brother   . Diabetes Brother   . Diabetes Maternal Grandmother   . Hypertension Maternal Grandmother   . Diabetes Maternal Grandfather   . Hypertension Maternal Grandfather   . Diabetes Paternal Grandmother   . Hypertension Paternal Grandmother   . Heart attack Maternal Uncle   . Breast cancer Neg Hx     Social History Social History   Tobacco Use  . Smoking status: Never Smoker  . Smokeless tobacco: Never Used  Substance Use Topics  . Alcohol use: Yes    Alcohol/week: 0.0 standard drinks    Comment: 1-2 beers/month  . Drug use: No    Review of Systems Constitutional: Found to be febrile in the emergency department.  Positive for generalized weakness. ENT: Positive for congestion Cardiovascular: Negative for chest pain. Respiratory: Positive for mild shortness of breath.  Positive for cough. Gastrointestinal: Negative for abdominal pain.  States nausea but no vomiting.  Mild diarrhea. Genitourinary: Negative for urinary compaints Musculoskeletal: Negative for musculoskeletal complaints Neurological: Negative for headache All other ROS negative  ____________________________________________   PHYSICAL  EXAM:  VITAL SIGNS: ED Triage Vitals  Enc Vitals Group     BP 02/02/19 2124 (!) 118/95     Pulse Rate 02/02/19 2124 100     Resp 02/02/19 2124 (!) 28     Temp 02/02/19 2124 (!) 100.6 F (38.1 C)     Temp Source 02/02/19 2124 Oral     SpO2 02/02/19 2124 (!) 89 %     Weight 02/02/19 2125 (!) 372 lb 9.2 oz (169 kg)     Height 02/02/19 2125 5' 9"  (1.753 m)     Head Circumference --      Peak Flow --      Pain Score 02/02/19 2125 0     Pain Loc --      Pain Edu? --      Excl. in [place]? --    Constitutional: Alert and oriented. Well appearing and in no distress. Eyes: Normal exam ENT      Head: Normocephalic and atraumatic.      Mouth/Throat:  Mucous membranes are moist. Cardiovascular: Normal rate, regular rhythm.  Respiratory: Normal respiratory effort without tachypnea nor retractions. Breath sounds are clear  Gastrointestinal: Soft and nontender. No distention.  Obese. Musculoskeletal: Nontender with normal range of motion in all extremities.  Neurologic:  Normal speech and language. No gross focal neurologic deficits  Skin:  Skin is warm, dry and intact.  Psychiatric: Mood and affect are normal.  ____________________________________________   RADIOLOGY  IMPRESSION:  Diffuse bilateral pulmonary opacities concerning for an atypical  infectious process such as viral pneumonia or developing pulmonary  edema.   ____________________________________________   INITIAL IMPRESSION / ASSESSMENT AND PLAN / ED COURSE  Pertinent labs & imaging results that were available during my care of the patient were reviewed by me and considered in my medical decision making (see chart for details).   Patient presents to the emergency department for cough congestion febrile to 100.6 in the emergency department.  Patient wears 2 L of oxygen 24/7 currently 89% on 2 L placed on 4 L of oxygen.  We will obtain a chest x-ray, labs, rapid Covid test dose IV fluids, cough medication and continue to  closely monitor.  Patient agreeable to plan of care.  Patient is Covid test is positive, chest x-ray shows bilateral opacities. Patient is currently on 5 L of oxygen via nasal cannula satting 91% normally on 2 L. Given the patient's significant comorbidities as well as increased oxygen requirement and chest x-ray findings we will admit to the hospitalist service for further treatment. I ordered 10 mg of dexamethasone for the patient.  I discussed patient with the hospitalist who preferred transfer to [facility].  Discussed patient with Dr. Joanell Rising of [facility] was accepted the patient to his service.  Sara Zhang was evaluated in Emergency Department on 02/02/2019 for the symptoms described in the history of present illness. She was evaluated in the context of the global COVID-19 pandemic, which necessitated consideration that the patient might be at risk for infection with the SARS-CoV-2 virus that causes COVID-19. Institutional protocols and algorithms that pertain to the evaluation of patients at risk for COVID-19 are in a state of rapid change based on information released by regulatory bodies including the CDC and federal and state organizations. These policies and algorithms were followed during the patient's care in the ED.  ____________________________________________   FINAL CLINICAL IMPRESSION(S) / ED DIAGNOSES  COVID-19 Hypoxia   Harvest Dark, MD 02/02/19 2229    Harvest Dark, MD 02/02/19 2344

## 2019-02-02 NOTE — ED Notes (Signed)
Patient resting in bed.

## 2019-02-02 NOTE — ED Triage Notes (Signed)
SOB, productive cough, fever since Thursday. Pt sats of room air 77%. Pt tells this RN she is on oxygen but did not bring her tank. Pt sats up to 89% on 4L. Pt on 2L at all times.

## 2019-02-03 ENCOUNTER — Telehealth: Payer: Medicare Other | Admitting: Family Medicine

## 2019-02-03 ENCOUNTER — Encounter (HOSPITAL_COMMUNITY): Payer: Self-pay | Admitting: Internal Medicine

## 2019-02-03 ENCOUNTER — Inpatient Hospital Stay (HOSPITAL_COMMUNITY)
Admission: EM | Admit: 2019-02-03 | Discharge: 2019-02-07 | DRG: 177 | Disposition: A | Discharge status: Home / Self Care | Payer: Medicare Other | Source: Other Acute Inpatient Hospital | Attending: Family Medicine | Admitting: Family Medicine

## 2019-02-03 DIAGNOSIS — I130 Hypertensive heart and chronic kidney disease with heart failure and stage 1 through stage 4 chronic kidney disease, or unspecified chronic kidney disease: Secondary | ICD-10-CM | POA: Diagnosis present

## 2019-02-03 DIAGNOSIS — Z825 Family history of asthma and other chronic lower respiratory diseases: Secondary | ICD-10-CM

## 2019-02-03 DIAGNOSIS — Z9981 Dependence on supplemental oxygen: Secondary | ICD-10-CM | POA: Diagnosis not present

## 2019-02-03 DIAGNOSIS — I5032 Chronic diastolic (congestive) heart failure: Secondary | ICD-10-CM | POA: Diagnosis present

## 2019-02-03 DIAGNOSIS — G4733 Obstructive sleep apnea (adult) (pediatric): Secondary | ICD-10-CM

## 2019-02-03 DIAGNOSIS — Z833 Family history of diabetes mellitus: Secondary | ICD-10-CM

## 2019-02-03 DIAGNOSIS — J1282 Pneumonia due to coronavirus disease 2019: Secondary | ICD-10-CM | POA: Diagnosis present

## 2019-02-03 DIAGNOSIS — N182 Chronic kidney disease, stage 2 (mild): Secondary | ICD-10-CM

## 2019-02-03 DIAGNOSIS — J9601 Acute respiratory failure with hypoxia: Secondary | ICD-10-CM

## 2019-02-03 DIAGNOSIS — Z9049 Acquired absence of other specified parts of digestive tract: Secondary | ICD-10-CM | POA: Diagnosis not present

## 2019-02-03 DIAGNOSIS — E785 Hyperlipidemia, unspecified: Secondary | ICD-10-CM | POA: Diagnosis present

## 2019-02-03 DIAGNOSIS — J9621 Acute and chronic respiratory failure with hypoxia: Secondary | ICD-10-CM | POA: Diagnosis present

## 2019-02-03 DIAGNOSIS — E1165 Type 2 diabetes mellitus with hyperglycemia: Secondary | ICD-10-CM | POA: Diagnosis not present

## 2019-02-03 DIAGNOSIS — Z8349 Family history of other endocrine, nutritional and metabolic diseases: Secondary | ICD-10-CM

## 2019-02-03 DIAGNOSIS — U071 COVID-19: Principal | ICD-10-CM

## 2019-02-03 DIAGNOSIS — E1169 Type 2 diabetes mellitus with other specified complication: Secondary | ICD-10-CM | POA: Diagnosis present

## 2019-02-03 DIAGNOSIS — E1122 Type 2 diabetes mellitus with diabetic chronic kidney disease: Secondary | ICD-10-CM | POA: Diagnosis present

## 2019-02-03 DIAGNOSIS — Z975 Presence of (intrauterine) contraceptive device: Secondary | ICD-10-CM | POA: Diagnosis not present

## 2019-02-03 DIAGNOSIS — Z8261 Family history of arthritis: Secondary | ICD-10-CM | POA: Diagnosis not present

## 2019-02-03 DIAGNOSIS — Z823 Family history of stroke: Secondary | ICD-10-CM | POA: Diagnosis not present

## 2019-02-03 DIAGNOSIS — Z79899 Other long term (current) drug therapy: Secondary | ICD-10-CM

## 2019-02-03 DIAGNOSIS — Z6841 Body Mass Index (BMI) 40.0 and over, adult: Secondary | ICD-10-CM | POA: Diagnosis not present

## 2019-02-03 DIAGNOSIS — Z8249 Family history of ischemic heart disease and other diseases of the circulatory system: Secondary | ICD-10-CM | POA: Diagnosis not present

## 2019-02-03 DIAGNOSIS — Z794 Long term (current) use of insulin: Secondary | ICD-10-CM

## 2019-02-03 LAB — CBC WITH DIFFERENTIAL/PLATELET
Abs Immature Granulocytes: 0.03 10*3/uL (ref 0.00–0.07)
Basophils Absolute: 0 10*3/uL (ref 0.0–0.1)
Basophils Relative: 0 %
Eosinophils Absolute: 0 10*3/uL (ref 0.0–0.5)
Eosinophils Relative: 0 %
HCT: 38.7 % (ref 36.0–46.0)
Hemoglobin: 11.2 g/dL — ABNORMAL LOW (ref 12.0–15.0)
Immature Granulocytes: 1 %
Lymphocytes Relative: 12 %
Lymphs Abs: 0.7 10*3/uL (ref 0.7–4.0)
MCH: 25.5 pg — ABNORMAL LOW (ref 26.0–34.0)
MCHC: 28.9 g/dL — ABNORMAL LOW (ref 30.0–36.0)
MCV: 88 fL (ref 80.0–100.0)
Monocytes Absolute: 0.2 10*3/uL (ref 0.1–1.0)
Monocytes Relative: 4 %
Neutro Abs: 5.2 10*3/uL (ref 1.7–7.7)
Neutrophils Relative %: 83 %
Platelets: 288 10*3/uL (ref 150–400)
RBC: 4.4 MIL/uL (ref 3.87–5.11)
RDW: 15.4 % (ref 11.5–15.5)
WBC: 6.2 10*3/uL (ref 4.0–10.5)
nRBC: 0 % (ref 0.0–0.2)

## 2019-02-03 LAB — HEMOGLOBIN A1C
Hgb A1c MFr Bld: 7.1 % — ABNORMAL HIGH (ref 4.8–5.6)
Mean Plasma Glucose: 157.07 mg/dL

## 2019-02-03 LAB — COMPREHENSIVE METABOLIC PANEL
ALT: 18 U/L (ref 0–44)
AST: 24 U/L (ref 15–41)
Albumin: 2.8 g/dL — ABNORMAL LOW (ref 3.5–5.0)
Alkaline Phosphatase: 68 U/L (ref 38–126)
Anion gap: 12 (ref 5–15)
BUN: 22 mg/dL — ABNORMAL HIGH (ref 6–20)
CO2: 26 mmol/L (ref 22–32)
Calcium: 7.4 mg/dL — ABNORMAL LOW (ref 8.9–10.3)
Chloride: 103 mmol/L (ref 98–111)
Creatinine, Ser: 1.26 mg/dL — ABNORMAL HIGH (ref 0.44–1.00)
GFR calc Af Amer: 58 mL/min — ABNORMAL LOW (ref >60)
GFR calc non Af Amer: 50 mL/min — ABNORMAL LOW (ref >60)
Glucose, Bld: 184 mg/dL — ABNORMAL HIGH (ref 70–99)
Potassium: 4.1 mmol/L (ref 3.5–5.1)
Sodium: 141 mmol/L (ref 135–145)
Total Bilirubin: 0.5 mg/dL (ref 0.3–1.2)
Total Protein: 7.4 g/dL (ref 6.5–8.1)

## 2019-02-03 LAB — BRAIN NATRIURETIC PEPTIDE: B Natriuretic Peptide: 27.9 pg/mL (ref 0.0–100.0)

## 2019-02-03 LAB — SAMPLE TO BLOOD BANK

## 2019-02-03 LAB — GLUCOSE, RANDOM: Glucose, Bld: 387 mg/dL — ABNORMAL HIGH (ref 70–99)

## 2019-02-03 LAB — C-REACTIVE PROTEIN: CRP: 10.9 mg/dL — ABNORMAL HIGH (ref <1.0)

## 2019-02-03 LAB — PROCALCITONIN
Procalcitonin: <0.1 ng/mL
Procalcitonin: <0.1 ng/mL

## 2019-02-03 LAB — GLUCOSE, CAPILLARY
Glucose-Capillary: 176 mg/dL — ABNORMAL HIGH (ref 70–99)
Glucose-Capillary: 311 mg/dL — ABNORMAL HIGH (ref 70–99)
Glucose-Capillary: 313 mg/dL — ABNORMAL HIGH (ref 70–99)
Glucose-Capillary: 430 mg/dL — ABNORMAL HIGH (ref 70–99)

## 2019-02-03 LAB — D-DIMER, QUANTITATIVE: D-Dimer, Quant: 1.08 ug/mL-FEU — ABNORMAL HIGH (ref 0.00–0.50)

## 2019-02-03 LAB — LACTATE DEHYDROGENASE: LDH: 251 U/L — ABNORMAL HIGH (ref 98–192)

## 2019-02-03 LAB — FERRITIN
Ferritin: 221 ng/mL (ref 11–307)
Ferritin: 202 ng/mL (ref 11–307)

## 2019-02-03 LAB — HIV ANTIBODY (ROUTINE TESTING W REFLEX): HIV Screen 4th Generation wRfx: NONREACTIVE

## 2019-02-03 MED ORDER — INSULIN ASPART 100 UNIT/ML ~~LOC~~ SOLN
6.0000 [IU] | Freq: Three times a day (TID) | SUBCUTANEOUS | Status: DC
  Administered 2019-02-03: 6 [IU] via SUBCUTANEOUS

## 2019-02-03 MED ORDER — ENOXAPARIN SODIUM 40 MG/0.4ML ~~LOC~~ SOLN: 40.0000 mg | SUBCUTANEOUS | Status: DC

## 2019-02-03 MED ORDER — SODIUM CHLORIDE 0.9 % IV SOLN
100.0000 mg | Freq: Every day | INTRAVENOUS | Status: AC
  Administered 2019-02-04 – 2019-02-07 (×4): 100 mg via INTRAVENOUS
  Filled 2019-02-03 (×5): qty 20

## 2019-02-03 MED ORDER — FUROSEMIDE 10 MG/ML IJ SOLN
40.0000 mg | Freq: Once | INTRAMUSCULAR | Status: AC
  Administered 2019-02-03: 40 mg via INTRAVENOUS
  Filled 2019-02-03: qty 4

## 2019-02-03 MED ORDER — ATORVASTATIN CALCIUM 10 MG PO TABS
10.0000 mg | ORAL_TABLET | Freq: Every day | ORAL | Status: DC
  Administered 2019-02-03 – 2019-02-07 (×5): 10 mg via ORAL
  Filled 2019-02-03 (×5): qty 1

## 2019-02-03 MED ORDER — GUAIFENESIN-DM 100-10 MG/5ML PO SYRP
10.0000 mL | ORAL_SOLUTION | ORAL | Status: DC | PRN
  Administered 2019-02-04 – 2019-02-06 (×3): 10 mL via ORAL
  Filled 2019-02-03 (×3): qty 10

## 2019-02-03 MED ORDER — ATENOLOL 50 MG PO TABS
50.0000 mg | ORAL_TABLET | Freq: Every day | ORAL | Status: DC
  Administered 2019-02-03 – 2019-02-07 (×5): 50 mg via ORAL
  Filled 2019-02-03 (×5): qty 1

## 2019-02-03 MED ORDER — INSULIN ASPART 100 UNIT/ML ~~LOC~~ SOLN
0.0000 [IU] | Freq: Three times a day (TID) | SUBCUTANEOUS | Status: DC
  Administered 2019-02-03: 15 [IU] via SUBCUTANEOUS

## 2019-02-03 MED ORDER — IPRATROPIUM-ALBUTEROL 20-100 MCG/ACT IN AERS
1.0000 | INHALATION_SPRAY | Freq: Four times a day (QID) | RESPIRATORY_TRACT | Status: DC
  Administered 2019-02-03 – 2019-02-07 (×18): 1 via RESPIRATORY_TRACT
  Filled 2019-02-03: qty 4

## 2019-02-03 MED ORDER — DOCUSATE SODIUM 100 MG PO CAPS
100.0000 mg | ORAL_CAPSULE | Freq: Every day | ORAL | Status: DC
  Administered 2019-02-03 – 2019-02-07 (×5): 100 mg via ORAL
  Filled 2019-02-03 (×5): qty 1

## 2019-02-03 MED ORDER — INSULIN ASPART 100 UNIT/ML ~~LOC~~ SOLN
0.0000 [IU] | Freq: Three times a day (TID) | SUBCUTANEOUS | Status: DC
  Administered 2019-02-03: 7 [IU] via SUBCUTANEOUS
  Administered 2019-02-03: 2 [IU] via SUBCUTANEOUS

## 2019-02-03 MED ORDER — INSULIN ASPART 100 UNIT/ML ~~LOC~~ SOLN: 0.0000 [IU] | Freq: Every day | SUBCUTANEOUS | Status: DC

## 2019-02-03 MED ORDER — AMLODIPINE BESYLATE 5 MG PO TABS
5.0000 mg | ORAL_TABLET | Freq: Every day | ORAL | Status: DC
  Administered 2019-02-03 – 2019-02-07 (×5): 5 mg via ORAL
  Filled 2019-02-03 (×5): qty 1

## 2019-02-03 MED ORDER — INSULIN GLARGINE 100 UNIT/ML ~~LOC~~ SOLN
30.0000 [IU] | Freq: Every day | SUBCUTANEOUS | Status: DC
  Filled 2019-02-03: qty 0.3

## 2019-02-03 MED ORDER — INSULIN DETEMIR 100 UNIT/ML ~~LOC~~ SOLN
0.1500 [IU]/kg | Freq: Two times a day (BID) | SUBCUTANEOUS | Status: DC
  Administered 2019-02-03 – 2019-02-05 (×5): 25 [IU] via SUBCUTANEOUS
  Filled 2019-02-03 (×6): qty 0.25

## 2019-02-03 MED ORDER — INSULIN ASPART 100 UNIT/ML ~~LOC~~ SOLN
0.0000 [IU] | SUBCUTANEOUS | Status: DC
  Administered 2019-02-03: 20 [IU] via SUBCUTANEOUS
  Administered 2019-02-04: 3 [IU] via SUBCUTANEOUS
  Administered 2019-02-04 (×2): 15 [IU] via SUBCUTANEOUS
  Administered 2019-02-04: 7 [IU] via SUBCUTANEOUS
  Administered 2019-02-04 (×2): 20 [IU] via SUBCUTANEOUS
  Administered 2019-02-05: 4 [IU] via SUBCUTANEOUS
  Administered 2019-02-05: 3 [IU] via SUBCUTANEOUS

## 2019-02-03 MED ORDER — METHYLPREDNISOLONE SODIUM SUCC 125 MG IJ SOLR
60.0000 mg | Freq: Three times a day (TID) | INTRAMUSCULAR | Status: DC
  Administered 2019-02-03 – 2019-02-06 (×9): 60 mg via INTRAVENOUS
  Filled 2019-02-03 (×9): qty 2

## 2019-02-03 MED ORDER — FUROSEMIDE 20 MG PO TABS
40.0000 mg | ORAL_TABLET | Freq: Two times a day (BID) | ORAL | Status: DC
  Administered 2019-02-03 – 2019-02-04 (×2): 40 mg via ORAL
  Filled 2019-02-03 (×2): qty 2

## 2019-02-03 MED ORDER — DEXAMETHASONE 6 MG PO TABS: 6.0000 mg | ORAL_TABLET | ORAL | Status: DC

## 2019-02-03 MED ORDER — ZINC SULFATE 220 (50 ZN) MG PO CAPS
220.0000 mg | ORAL_CAPSULE | Freq: Every day | ORAL | Status: DC
  Administered 2019-02-03 – 2019-02-07 (×5): 220 mg via ORAL
  Filled 2019-02-03 (×5): qty 1

## 2019-02-03 MED ORDER — SODIUM CHLORIDE 0.9 % IV SOLN
200.0000 mg | Freq: Once | INTRAVENOUS | Status: AC
  Administered 2019-02-03: 200 mg via INTRAVENOUS
  Filled 2019-02-03: qty 40

## 2019-02-03 MED ORDER — LISINOPRIL 20 MG PO TABS
40.0000 mg | ORAL_TABLET | Freq: Every day | ORAL | Status: DC
  Administered 2019-02-03 – 2019-02-07 (×5): 40 mg via ORAL
  Filled 2019-02-03 (×5): qty 2

## 2019-02-03 MED ORDER — HYDROCOD POLST-CPM POLST ER 10-8 MG/5ML PO SUER
5.0000 mL | Freq: Two times a day (BID) | ORAL | Status: DC | PRN
  Administered 2019-02-05 – 2019-02-07 (×2): 5 mL via ORAL
  Filled 2019-02-03 (×2): qty 5

## 2019-02-03 MED ORDER — ASCORBIC ACID 500 MG PO TABS
500.0000 mg | ORAL_TABLET | Freq: Every day | ORAL | Status: DC
  Administered 2019-02-03 – 2019-02-07 (×5): 500 mg via ORAL
  Filled 2019-02-03 (×5): qty 1

## 2019-02-03 MED ORDER — ACETAMINOPHEN 325 MG PO TABS: 650.0000 mg | ORAL_TABLET | Freq: Four times a day (QID) | ORAL | Status: DC | PRN

## 2019-02-03 MED ORDER — ENOXAPARIN SODIUM 100 MG/ML ~~LOC~~ SOLN
85.0000 mg | SUBCUTANEOUS | Status: DC
  Administered 2019-02-03 – 2019-02-07 (×5): 85 mg via SUBCUTANEOUS
  Filled 2019-02-03 (×5): qty 1

## 2019-02-03 NOTE — ED Notes (Signed)
Report given to [place] at [facility], [place] here to transport

## 2019-02-03 NOTE — ED Notes (Signed)
Patient transferred to [facility]

## 2019-02-03 NOTE — Progress Notes (Signed)
[facility] night shift.  The staff reports persistent hyperglycemia with most recent CBG at 430 mg/dL. Previous two measurements over 300 mg/dL. Her weight is 169 Kg with a BMI of 55.02 Kg/m2. She is on 60 mg of solumedrol every 12 hours. I have initiated the COVID-19 glycemic control protocol with CBG every four hours given expected rise of glycemia while receiving glucocorticoid.   Tennis Must, MD

## 2019-02-03 NOTE — ED Notes (Signed)
Patient ambulated to restroom without difficulty.

## 2019-02-03 NOTE — ED Notes (Signed)
Waiting for bed assignment at [facility]

## 2019-02-03 NOTE — Progress Notes (Signed)
PROGRESS NOTE    [name]  YQM:[id number] DOB: June 02, 1970 DOA: 02/03/2019 PCP: Venita Lick, NP   Brief Narrative:  Sara Zhang  is Sara Zhang 48 y.o. female, with IDDM, CKD, HTN, HLD, chronic hypoxic respiratory failure on 2 L [place] O2, HFpEF who presented to [facility] with progressive shortness of breath, cough, found to be in acute on chronic respiratory failure with hypoxia secondary to SARS-CoV-2 pneumonia.  She reports she started feeling unwell about [place] Noon week ago, initially she had subjective fevers with chills accompanied by intermittent cough.  However, over the last couple days she has had progressive shortness of breath prompting the ER presentation.  Cough is intermittently productive of yellow sputum.  She denies any hemoptysis.  She denies any chest pain, palpitations, orthopnea, PND.  No sick contacts.  No recent travel.  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  Discussed risks, benefits, unclear evidence.  She denies hx hepatitis or tuberculosis.  She was agreeable if she were to need this if she worsened. Daily inflammatory labs I/O, daily weights  COVID-19 Labs  Recent Labs    02/02/19 2139  02/03/19 0643  DDIMER  --  1.08*  FERRITIN 221 202  LDH 251*  --   CRP  --  10.9*    No results found for: SARSCOV2NAA  T2DM  HLD:  Continue lipitor Continue lantus (decreased dose, uses 54 units toujeo at home).  SSI and mealtime.  Holding oral antihyperglycemics.  Hypertension: continue amlodipine, atenolol, lasix   CKD Stage II: follow with lasix, lisinopril  Obstructive sleep apnea: CPAP on hold given COVID positive.  Follow with continuous pulse ox and O2.  Morbid obesity/BMI 50.0-59.9, adult (HCC):Obesity affects all facets of care.  Likely secondary to excessive caloric intake.  Encourage patient to reduce caloric intake while increasing physical activity and engage in other lifestyle changes that may result in weight loss.     Chronic heart failure with preserved ejection fraction: Continue home lasix, lisinopril.   Chronic Hypoxic Respiratory Failure: uses 2 L at baseline.   DVT prophylaxis: lovenox Code Status: full  Family Communication: none at bedside Disposition Plan: pending further improvement   Consultants:   none  Procedures:   none  Antimicrobials:  Anti-infectives (From admission, onward)   Start     Dose/Rate Route Frequency Ordered Stop   02/04/19 1000  remdesivir 100 mg in sodium chloride 0.9 % 100 mL IVPB     100 mg 200 mL/hr over 30 Minutes Intravenous Daily 02/03/19 0624 02/08/19 0959   02/03/19 1000  remdesivir 200 mg in sodium chloride 0.9% 250 mL IVPB     200 mg 580 mL/hr over 30 Minutes Intravenous Once  02/03/19 0624 02/03/19 1140      Subjective: Feeling ok today.  Feels better, still [place] little SOB. Felt sick since last Thursday.  Objective: Vitals:   02/03/19 0655 02/03/19 0731 02/03/19 1120 02/03/19 1200  BP: 98/66 113/69 104/65   Pulse: 63 74 80   Resp: 20 19    Temp: 98.1 F (36.7 C) 97.9 F (36.6 C) 97.8 F (36.6 C) 98.4 F (36.9 C)  TempSrc: Oral Oral Oral Oral  SpO2:  91% 90%   Weight: (!) 169 kg     Height:  5\' 9"  (1.753 m)       Intake/Output Summary (Last 24 hours) at 02/03/2019 1554 Last data filed at 02/03/2019 1236 Gross per 24 hour  Intake 400 ml  Output --  Net 400 ml   Filed Weights   02/03/19 0655  Weight: (!) 169 kg    Examination:  General exam: Appears calm and comfortable  Respiratory system: Clear to auscultation. Respiratory effort normal. Cardiovascular system: RRR Gastrointestinal system: Abdomen is nondistended, soft and nontender Central nervous system: Alert and oriented. No focal neurological deficits. Extremities: trace edema Skin: No rashes, lesions or ulcers Psychiatry: Judgement and insight appear normal. Mood & affect appropriate.     Data Reviewed: I have personally reviewed following labs and imaging studies  CBC: Recent Labs  Lab 02/02/19 2139 02/03/19 0643  WBC 6.8 6.2  NEUTROABS  --  5.2  HGB 11.5* 11.2*  HCT 37.1 38.7  MCV 81.4 88.0  PLT 308 288   Basic Metabolic Panel: Recent Labs  Lab 02/02/19 2139 02/03/19 0643  NA 139 141  K 3.4* 4.1  CL 102 103  CO2 25 26  GLUCOSE 169* 184*  BUN 19 22*  CREATININE 1.37* 1.26*  CALCIUM 7.4* 7.4*   GFR: Estimated Creatinine Clearance: 90.5 mL/min (Korynn Kenedy) (by C-G formula based on SCr of 1.26 mg/dL (H)). Liver Function Tests: Recent Labs  Lab 02/02/19 2139 02/03/19 0643  AST 24 24  ALT 18 18  ALKPHOS 72 68  BILITOT 0.8 0.5  PROT 7.9 7.4  ALBUMIN 3.2* 2.8*   No results for input(s): LIPASE, AMYLASE in the last 168 hours. No results for input(s): AMMONIA in the last 168 hours. Coagulation Profile: No results for input(s): INR, PROTIME in the last 168 hours. Cardiac Enzymes: No results for input(s): CKTOTAL, CKMB, CKMBINDEX, TROPONINI in the last 168 hours. BNP (last 3 results) No results for input(s): PROBNP in the last 8760 hours. HbA1C: Recent Labs    02/03/19 0643  HGBA1C 7.1*   CBG: Recent Labs  Lab 02/03/19 0819 02/03/19 1115  GLUCAP 176* 311*   Lipid Profile: No  results for input(s): CHOL, HDL, LDLCALC, TRIG, CHOLHDL, LDLDIRECT in the last 72 hours. Thyroid Function Tests: No results for input(s): TSH, T4TOTAL, FREET4, T3FREE, THYROIDAB in the last 72 hours. Anemia Panel: Recent Labs    02/02/19 2139 02/03/19 0643  FERRITIN 221 202   Sepsis Labs: Recent Labs  Lab 02/02/19 2139 02/03/19 0643  PROCALCITON <0.10 <0.10    No results found for this or any previous visit (from the past 240 hour(s)).       Radiology Studies: DG Chest Portable 1 View  Result Date: 02/02/2019 CLINICAL DATA:  Cough and fever. EXAM: PORTABLE CHEST 1 VIEW COMPARISON:  05/23/2006 FINDINGS: In the cardiac silhouette is significantly enlarged. There are pulmonary opacities bilaterally. There may be small bilateral pleural effusions. There is no pneumothorax. There is no acute osseous abnormality. IMPRESSION: Diffuse bilateral pulmonary  opacities concerning for an atypical infectious process such as viral pneumonia or developing pulmonary edema. Electronically Signed   By: Katherine Mantle M.D.   On: 02/02/2019 21:50        Scheduled Meds: . amLODipine  5 mg Oral Daily  . vitamin C  500 mg Oral Daily  . atenolol  50 mg Oral Daily  . atorvastatin  10 mg Oral Daily  . docusate sodium  100 mg Oral Daily  . enoxaparin (LOVENOX) injection  85 mg Subcutaneous Q24H  . insulin aspart  0-5 Units Subcutaneous QHS  . insulin aspart  0-9 Units Subcutaneous TID WC  . insulin glargine  30 Units Subcutaneous QHS  . Ipratropium-Albuterol  1 puff Inhalation Q6H  . lisinopril  40 mg Oral Daily  . methylPREDNISolone (SOLU-MEDROL) injection  60 mg Intravenous Q8H  . zinc sulfate  220 mg Oral Daily   Continuous Infusions: . [START ON 02/04/2019] remdesivir 100 mg in NS 100 mL       LOS: 0 days    Time spent: over 30 min    Lacretia Nicks, MD [facility] Pager [email or web address]  If 7PM-7AM, please contact night-coverage [email or web address] Password [email or web address] 02/03/2019,  3:54 PM

## 2019-02-03 NOTE — ED Notes (Signed)
Report given to [facility]. 

## 2019-02-03 NOTE — ED Notes (Signed)
Patient gives verbal permission for transfer to [facility] for further COVID treatment

## 2019-02-03 NOTE — Plan of Care (Signed)
  Problem: Clinical Measurements: Goal: Will remain free from infection 02/03/2019 2151 by Lovenia Kim, RN Outcome: Progressing 02/03/2019 2149 by Lovenia Kim, RN Outcome: Progressing   Problem: Education: Goal: Knowledge of risk factors and measures for prevention of condition will improve Outcome: Progressing   Problem: Coping: Goal: Psychosocial and spiritual needs will be supported Outcome: Progressing   Problem: Respiratory: Goal: Will maintain a patent airway Outcome: Progressing Goal: Complications related to the disease process, condition or treatment will be avoided or minimized Outcome: Progressing

## 2019-02-03 NOTE — ED Notes (Signed)
Patient asleep, no complaints at present. Call bell within reach. NAD, vital signs stable. 

## 2019-02-03 NOTE — H&P (Signed)
TRH H&P   Patient Demographics:    Ketsia Linebaugh, is a 48 y.o. female  MRN: [id number]   DOB - Dec 06, 1970  Admit Date - 02/03/2019  Outpatient Primary MD for the patient is Venita Lick, NP  Patient coming from: [facility]  No chief complaint on file.     HPI:    Raine Blodgett  is a 48 y.o. female, with IDDM, CKD, HTN, HLD, chronic hypoxic respiratory failure on 2 L [place] O2, HFpEF who presented to [facility] with progressive shortness of breath, cough, found to be in acute on chronic respiratory failure with hypoxia secondary to SARS-CoV-2 pneumonia.  She reports she started feeling unwell about a week ago, initially she had subjective fevers with chills accompanied by intermittent cough.  However, over the last couple days she has had progressive shortness of breath prompting the ER presentation.  Cough is intermittently productive of yellow sputum.  She denies any hemoptysis.  She denies any chest pain, palpitations, orthopnea, PND.  No sick contacts.  No recent travel.    Review of systems:  Review of Systems:  Constitutional: See HPI HEENT: negative for earaches, epistaxis, or sore throat Respiratory: See HPI Cardiovascular: negative for chest pain, palpitations, or syncope GU: negative for dysuria, urinary frequency, urinary urgency, hematuria Gastrointestinal: negative for abdominal pain, constipation, diarrhea, nausea or vomiting Musculoskeletal: negative for arthralgias, back pain or myalgias Neurological: negative for dizziness, headaches or weakness Behavioral/Psych: negative for suicidal or homicidal ideation Skin:negative for rash Heme: negative for bruises Endo: negative for hair loss, weight  gain/loss  With Past History of the following :   Past Medical History:  Diagnosis Date  . Abnormal uterine bleeding (AUB) 11/25/2016  . Chronic diastolic CHF (congestive heart failure) ([place])    a. 02/2012 Echo: EF 25-35%, glob HK; b. 06/2016 Echo: EF 55-60%, mild MR, nl PASP.  Marland Kitchen CKD (chronic kidney disease), stage II   . Diabetes mellitus without complication ([place])   . Extreme obesity   . Hyperlipidemia   . Hypertension   . Hypoxia   . Microcytic anemia   . Sleep apnea   . Status post endometrial ablation 06/20/2016   2015 hysteroscopy/D&C with NovaSure endometrial ablation; pathology-benign endometrial polyps  . Thickened endometrium 06/20/2016   Ultrasound-22.3 mm endometrial stripe, heterogenous  . Vaginal bleeding    a. 01/2017  s/p hyteroscopy and polypectomy [facility]).  . Vitamin D deficiency       Past Surgical History:  Procedure Laterality Date  . CESAREAN SECTION    . CHOLECYSTECTOMY    . DILATION AND CURETTAGE, DIAGNOSTIC / THERAPEUTIC    . TONSILLECTOMY AND ADENOIDECTOMY    . TOOTH EXTRACTION  01/2016     Social History:     Social History   Tobacco Use  . Smoking status: Never Smoker  . Smokeless tobacco: Never Used  Substance Use Topics  . Alcohol use: Yes    Alcohol/week: 0.0 standard drinks    Comment: 1-2 beers/month     Family History :     Family History  Problem Relation Age of Onset  . Diabetes Mother   . Hyperlipidemia Mother   . Hypertension Mother   . Stroke Mother   . Arthritis Father   . Asthma Father   . Diabetes Father   . Hypertension Father   . Hypertension Brother   . Diabetes Brother   . Diabetes Maternal Grandmother   . Hypertension Maternal Grandmother   . Diabetes Maternal Grandfather   . Hypertension Maternal Grandfather   . Diabetes Paternal Grandmother   . Hypertension Paternal Grandmother   . Heart attack Maternal Uncle   . Breast cancer Neg Hx      Home Medications:   Prior to Admission medications    Medication Sig Start Date End Date Taking? Authorizing Provider  amLODipine (NORVASC) 5 MG tablet Take 1 tablet (5 mg total) by mouth daily. 01/20/19 04/20/19  End, Harrell Gave, MD  atenolol (TENORMIN) 50 MG tablet TAKE 1 TABLET BY MOUTH ONCE DAILY 08/31/18   End, Harrell Gave, MD  atorvastatin (LIPITOR) 10 MG tablet Take 1 tablet (10 mg total) by mouth daily. 12/07/18   Marnee Guarneri T, NP  blood glucose meter kit and supplies KIT Dispense based on patient and insurance preference. Use up to four times daily as directed. (FOR ICD-9 250.00, 250.01). 11/18/17   Cannady, Jolene T, NP  BYDUREON BCISE 2 MG/0.85ML AUIJ INJECT 2 MG UNDER THE SKIN EVERY WEEK 01/14/19   Cannady, Jolene T, NP  Calcium Carbonate-Vitamin D (OYSTER SHELL CALCIUM 500 + D) 500-125 MG-UNIT TABS Take by mouth daily.    [provider]  furosemide (LASIX) 40 MG tablet Take 1 tablet (40 mg total) by mouth 2 (two) times daily. 11/02/18 01/31/19  Theora Gianotti, NP  glucose blood (CONTOUR NEXT TEST) test strip TEST BLOOD SUGAR ONCE DAILY 01/09/18   Cannady, Henrine Screws T, NP  Insulin Glargine (TOUJEO SOLOSTAR) 300 UNIT/ML SOPN Inject 54 Units into the skin at bedtime. 11/18/17   Cannady, Henrine Screws T, NP  Insulin Pen Needle 32G X 4 MM MISC 1 Units by Does not apply route every morning. Pen needles 08/21/16   Kathrine Haddock, NP  JARDIANCE 25 MG TABS tablet Take 25 mg by mouth daily. 12/27/18   [provider]  levonorgestrel (MIRENA) 20 MCG/24HR IUD 1 each by Intrauterine route once.    [provider]  lisinopril (ZESTRIL) 40 MG tablet TAKE 1 TABLET BY MOUTH EVERY DAY 02/01/19   End, Harrell Gave, MD  OXYGEN Inhale 2 L into the lungs daily.    [provider]  VITAMIN D PO Take by mouth daily.    [provider]     Allergies:    No Known Allergies   Physical Exam:   Vitals  Blood pressure 98/66, pulse 63, temperature 98.1 F (36.7 C), temperature source Oral,  resp. rate 20, height  _0  (1.753 m), weight (!) 169 kg, SpO2 92 %.  Physical Exam   Constitutional - resting comfortably, no acute distress Eyes - pupils equal round and reactive to light and accomodation, extra ocular movements intact Nose - no gross deformity or drainage Mouth - no oral lesions noted Throat - no swelling or erythema Neck - supple, no JVD   CV - (+)S1S2, no murmurs  Resp -bilateral lower lung field rales,  GI - (+)BS, soft, non-tender, non-distended Extrem -trace pretibial edema, no clubbing, cyanosis,   Skin - no rashes or wounds Neuro - alert, aware, oriented to person/place/time  Psych - normal affect, no anxiety   Patient has Pressure Ulcer on Admission?: no   Data Review:    CBC Recent Labs  Lab 02/02/19 2139  WBC 6.8  HGB 11.5*  HCT 37.1  PLT 308  MCV 81.4  MCH 25.2*  MCHC 31.0  RDW 15.5   ------------------------------------------------------------------------------------------------------------------  Chemistries  Recent Labs  Lab 02/02/19 2139  NA 139  K 3.4*  CL 102  CO2 25  GLUCOSE 169*  BUN 19  CREATININE 1.37*  CALCIUM 7.4*  AST 24  ALT 18  ALKPHOS 72  BILITOT 0.8   ------------------------------------------------------------------------------------------------------------------ estimated creatinine clearance is 83.2 mL/min (A) (by C-G formula based on SCr of 1.37 mg/dL (H)). ------------------------------------------------------------------------------------------------------------------ No results for input(s): TSH, T4TOTAL, T3FREE, THYROIDAB in the last 72 hours.  Invalid input(s): FREET3  Coagulation profile No results for input(s): INR, PROTIME in the last 168 hours. ------------------------------------------------------------------------------------------------------------------- No results for input(s): DDIMER in the last 72  hours. -------------------------------------------------------------------------------------------------------------------  Cardiac Enzymes No results for input(s): CKMB, TROPONINI, MYOGLOBIN in the last 168 hours.  Invalid input(s): CK ------------------------------------------------------------------------------------------------------------------    Component Value Date/Time   BNP 51.0 12/16/2015 1230     ---------------------------------------------------------------------------------------------------------------  Urinalysis    Component Value Date/Time   COLORURINE YELLOW (A) 12/16/2015 1332   APPEARANCEUR CLEAR (A) 12/16/2015 1332   APPEARANCEUR Clear 06/17/2013 1151   LABSPEC 1.030 12/16/2015 1332   LABSPEC 1.028 06/17/2013 1151   PHURINE 5.0 12/16/2015 1332   GLUCOSEU NEGATIVE 12/16/2015 1332   GLUCOSEU >=500 06/17/2013 1151   HGBUR 2+ (A) 12/16/2015 1332   BILIRUBINUR NEGATIVE 12/16/2015 1332   BILIRUBINUR Negative 06/17/2013 1151   KETONESUR NEGATIVE 12/16/2015 1332   PROTEINUR >500 (A) 12/16/2015 1332   NITRITE NEGATIVE 12/16/2015 1332   LEUKOCYTESUR NEGATIVE 12/16/2015 1332   LEUKOCYTESUR Negative 06/17/2013 1151    ----------------------------------------------------------------------------------------------------------------   Imaging Results:    DG Chest Portable 1 View  Result Date: 02/02/2019 CLINICAL DATA:  Cough and fever. EXAM: PORTABLE CHEST 1 VIEW COMPARISON:  05/23/2006 FINDINGS: In the cardiac silhouette is significantly enlarged. There are pulmonary opacities bilaterally. There may be small bilateral pleural effusions. There is no pneumothorax. There is no acute osseous abnormality. IMPRESSION: Diffuse bilateral pulmonary opacities concerning for an atypical infectious process such as viral pneumonia or developing pulmonary edema. Electronically Signed   By: Constance Holster M.D.   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  Admit patient for further management, initiate steroids, remdesivir per protocol.  Wean oxygen from 5 L to his baseline 2 L as tolerated.  Incentive spirometry.  Clinically she may be slightly fluid overloaded IV Lasix and reassess volume status.    Hyperlipidemia associated with type 2 diabetes mellitus: Cardiac diet.  Continue Lipitor.  Diabetic diet.  Continue Lantus, sliding scale insulin.  Holding oral diabetes medications.    Obstructive sleep apnea: CPAP at night    Morbid obesity/BMI 50.0-59.9, adult (HCC):Obesity affects all facets of care.  Likely secondary to excessive caloric intake.  Encourage patient to reduce caloric intake while increasing physical activity and engage in other lifestyle changes that may result in weight loss.     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  Continue amlodipine, atenolol,     Chronic heart failure with preserved ejection fraction: Clinically slightly fluid overloaded.  Lasix as above.  Continue lisinopril.  DVT Prophylaxis Lovenox  AM Labs Ordered, also please review Full Orders  Family Communication: Admission, patients condition and plan of care including tests being ordered have been  discussed with the patient who indicate understanding and agree with the plan and Code Status.  Code Status full code  Likely DC to home  Condition GUARDED    Consults called: None  Admission status: Admit to inpatient  Time spent in minutes : 96   Peyton Bottoms M.D on 02/03/2019 at 7:34 AM  To page go to [email or web address] - password [facility]

## 2019-02-03 NOTE — Progress Notes (Signed)
Call placed to patient's father top give updates. No answer on the phone

## 2019-02-03 NOTE — ED Notes (Signed)
This RN has attempted to call report to [facility] [place] without success x2

## 2019-02-04 LAB — COMPREHENSIVE METABOLIC PANEL
ALT: 19 U/L (ref 0–44)
AST: 20 U/L (ref 15–41)
Albumin: 2.9 g/dL — ABNORMAL LOW (ref 3.5–5.0)
Alkaline Phosphatase: 61 U/L (ref 38–126)
Anion gap: 12 (ref 5–15)
BUN: 35 mg/dL — ABNORMAL HIGH (ref 6–20)
CO2: 28 mmol/L (ref 22–32)
Calcium: 7.8 mg/dL — ABNORMAL LOW (ref 8.9–10.3)
Chloride: 103 mmol/L (ref 98–111)
Creatinine, Ser: 1.23 mg/dL — ABNORMAL HIGH (ref 0.44–1.00)
GFR calc Af Amer: 59 mL/min — ABNORMAL LOW (ref >60)
GFR calc non Af Amer: 51 mL/min — ABNORMAL LOW (ref >60)
Glucose, Bld: 224 mg/dL — ABNORMAL HIGH (ref 70–99)
Potassium: 4 mmol/L (ref 3.5–5.1)
Sodium: 143 mmol/L (ref 135–145)
Total Bilirubin: 0.4 mg/dL (ref 0.3–1.2)
Total Protein: 7.8 g/dL (ref 6.5–8.1)

## 2019-02-04 LAB — CBC WITH DIFFERENTIAL/PLATELET
Abs Immature Granulocytes: 0.05 10*3/uL (ref 0.00–0.07)
Basophils Absolute: 0 10*3/uL (ref 0.0–0.1)
Basophils Relative: 0 %
Eosinophils Absolute: 0 10*3/uL (ref 0.0–0.5)
Eosinophils Relative: 0 %
HCT: 39.4 % (ref 36.0–46.0)
Hemoglobin: 11.4 g/dL — ABNORMAL LOW (ref 12.0–15.0)
Immature Granulocytes: 1 %
Lymphocytes Relative: 12 %
Lymphs Abs: 0.8 10*3/uL (ref 0.7–4.0)
MCH: 25.2 pg — ABNORMAL LOW (ref 26.0–34.0)
MCHC: 28.9 g/dL — ABNORMAL LOW (ref 30.0–36.0)
MCV: 87 fL (ref 80.0–100.0)
Monocytes Absolute: 0.5 10*3/uL (ref 0.1–1.0)
Monocytes Relative: 7 %
Neutro Abs: 5.8 10*3/uL (ref 1.7–7.7)
Neutrophils Relative %: 80 %
Platelets: 305 10*3/uL (ref 150–400)
RBC: 4.53 MIL/uL (ref 3.87–5.11)
RDW: 15.4 % (ref 11.5–15.5)
WBC: 7.2 10*3/uL (ref 4.0–10.5)
nRBC: 0 % (ref 0.0–0.2)

## 2019-02-04 LAB — GLUCOSE, CAPILLARY
Glucose-Capillary: 309 mg/dL — ABNORMAL HIGH (ref 70–99)
Glucose-Capillary: 203 mg/dL — ABNORMAL HIGH (ref 70–99)
Glucose-Capillary: 155 mg/dL — ABNORMAL HIGH (ref 70–99)
Glucose-Capillary: 342 mg/dL — ABNORMAL HIGH (ref 70–99)
Glucose-Capillary: 447 mg/dL — ABNORMAL HIGH (ref 70–99)
Glucose-Capillary: 379 mg/dL — ABNORMAL HIGH (ref 70–99)
Glucose-Capillary: 364 mg/dL — ABNORMAL HIGH (ref 70–99)
Glucose-Capillary: 267 mg/dL — ABNORMAL HIGH (ref 70–99)

## 2019-02-04 LAB — D-DIMER, QUANTITATIVE: D-Dimer, Quant: 0.77 ug/mL-FEU — ABNORMAL HIGH (ref 0.00–0.50)

## 2019-02-04 LAB — C-REACTIVE PROTEIN: CRP: 9.5 mg/dL — ABNORMAL HIGH (ref <1.0)

## 2019-02-04 LAB — MAGNESIUM: Magnesium: 2.4 mg/dL (ref 1.7–2.4)

## 2019-02-04 LAB — PHOSPHORUS: Phosphorus: 3.6 mg/dL (ref 2.5–4.6)

## 2019-02-04 LAB — FERRITIN: Ferritin: 227 ng/mL (ref 11–307)

## 2019-02-04 MED ORDER — INSULIN ASPART 100 UNIT/ML ~~LOC~~ SOLN
6.0000 [IU] | Freq: Three times a day (TID) | SUBCUTANEOUS | Status: DC
  Administered 2019-02-04: 6 [IU] via SUBCUTANEOUS

## 2019-02-04 MED ORDER — FUROSEMIDE 20 MG PO TABS
40.0000 mg | ORAL_TABLET | Freq: Two times a day (BID) | ORAL | Status: DC
  Administered 2019-02-04 – 2019-02-07 (×7): 40 mg via ORAL
  Filled 2019-02-04 (×7): qty 2

## 2019-02-04 NOTE — Progress Notes (Signed)
Patient's father called with daily updates.

## 2019-02-04 NOTE — Progress Notes (Addendum)
PROGRESS NOTE    Sara Zhang  EFE:[id number] DOB: 10/08/1970 DOA: 02/03/2019 PCP: Marjie Skiff, NP   Brief Narrative:  Sara Zhang  is Sara Zhang 48 y.o. female, with IDDM, CKD, HTN, HLD, chronic hypoxic respiratory failure on 2 L [place] O2, HFpEF who presented to [facility] with progressive shortness of breath, cough, found to be in acute on chronic respiratory failure with hypoxia secondary to SARS-CoV-2 pneumonia.  She reports she started feeling unwell about Sybol Morre week ago, initially she had subjective fevers with chills accompanied by intermittent cough.  However, over the last couple days she has had progressive shortness of breath prompting the ER presentation.  Cough is intermittently productive of yellow sputum.  She denies any hemoptysis.  She denies any chest pain, palpitations, orthopnea, PND.  No sick contacts.  No recent travel.  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  Discussed risks, benefits, unclear evidence.  She denies hx hepatitis or tuberculosis.  She was agreeable if she were to need this if she worsened. Daily inflammatory labs I/O, daily weights  COVID-19 Labs  Recent Labs    02/02/19 2139 02/03/19 0643 02/04/19 0345  DDIMER  --  1.08* 0.77*  FERRITIN 221 202 227  LDH 251*  --   --   CRP  --  10.9*  9.5*    No results found for: SARSCOV2NAA  T2DM  HLD:  Continue lipitor (uses 54 units toujeo at home).  Continue BID levemir and mealtime insulin.  Q4 SSI for now.  Holding oral antihyperglycemics.  Hypertension: continue amlodipine, atenolol, lasix   CKD Stage II: follow with lasix, lisinopril  Obstructive sleep apnea: CPAP on hold given COVID positive.  Follow with continuous pulse ox and O2.  Morbid obesity/BMI 50.0-59.9, adult (HCC):Obesity affects all facets of care.  Likely secondary to excessive caloric intake.  Encourage patient to reduce caloric intake while increasing physical activity and engage in other lifestyle changes that may result in weight loss.   Chronic heart failure with preserved ejection fraction: Continue home lasix, lisinopril.   Chronic Hypoxic Respiratory Failure: uses 2 L at baseline.   DVT prophylaxis: lovenox Code Status: full  Family Communication: none at bedside - declined me calling Disposition Plan: pending further improvement   Consultants:   none  Procedures:   none  Antimicrobials:  Anti-infectives (From admission, onward)   Start     Dose/Rate Route Frequency Ordered Stop   02/04/19 1000  remdesivir 100 mg in sodium chloride 0.9 % 100 mL IVPB     100 mg 200 mL/hr over 30 Minutes Intravenous Daily 02/03/19 0624 02/08/19 0959   02/03/19 1000  remdesivir 200 mg in sodium chloride 0.9% 250 mL IVPB     200 mg 580 mL/hr over 30 Minutes Intravenous Once 02/03/19 8325 02/03/19 1140      Subjective: Feels about the same  today, no new complaints  Objective: Vitals:   02/04/19 0017 02/04/19 0430 02/04/19 0710 02/04/19 1100  BP: 103/73 116/78 96/67 128/81  Pulse: 72 73 72 80  Resp: 20 20 19 18   Temp: 98 F (36.7 C) 97.6 F (36.4 C) 97.8 F (36.6 C) 97.7 F (36.5 C)  TempSrc: Oral Oral Oral Oral  SpO2: 97% 93% 92% 90%  Weight:      Height:        Intake/Output Summary (Last 24 hours) at 02/04/2019 1334 Last data filed  at 02/03/2019 1700 Gross per 24 hour  Intake 240 ml  Output 600 ml  Net -360 ml   Filed Weights   02/03/19 0655  Weight: (!) 169 kg    Examination:  General: No acute distress. Cardiovascular: RRR Lungs: Clear to auscultation bilaterally Abdomen: Soft, nontender, nondistended Neurological: Alert and oriented 3. Moves all extremities 4. Cranial nerves II through XII grossly intact. Skin: Warm and dry. No rashes or lesions. Extremities: No clubbing or cyanosis. Trace edema.   Data Reviewed: I have personally reviewed following labs and imaging studies  CBC: Recent Labs  Lab 02/02/19 2139 02/03/19 0643 02/04/19 0345  WBC 6.8 6.2 7.2  NEUTROABS  --  5.2 5.8  HGB 11.5* 11.2* 11.4*  HCT 37.1 38.7 39.4  MCV 81.4 88.0 87.0  PLT 308 288 305   Basic Metabolic Panel: Recent Labs  Lab 02/02/19 2139 02/03/19 0643 02/03/19 2210 02/04/19 0345  NA 139 141  --  143  K 3.4* 4.1  --  4.0  CL 102 103  --  103  CO2 25 26  --  28  GLUCOSE 169* 184* 387* 224*  BUN 19 22*  --  35*  CREATININE 1.37* 1.26*  --  1.23*  CALCIUM 7.4* 7.4*  --  7.8*  MG  --   --   --  2.4  PHOS  --   --   --  3.6   GFR: Estimated Creatinine Clearance: 92.7 mL/min (Sara Zhang) (by C-G formula based on SCr of 1.23 mg/dL (H)). Liver Function Tests: Recent Labs  Lab 02/02/19 2139 02/03/19 0643 02/04/19 0345  AST 24 24 20   ALT 18 18 19   ALKPHOS 72 68 61  BILITOT 0.8 0.5 0.4  PROT 7.9 7.4 7.8  ALBUMIN 3.2* 2.8* 2.9*   No results for input(s): LIPASE, AMYLASE in the last 168 hours. No results for input(s): AMMONIA in the last 168 hours. Coagulation Profile: No results for input(s): INR, PROTIME in the last 168 hours. Cardiac Enzymes: No results for input(s): CKTOTAL, CKMB, CKMBINDEX, TROPONINI in the last 168 hours. BNP (last 3 results) No results for input(s): PROBNP in the last 8760 hours. HbA1C: Recent Labs    02/03/19 0643  HGBA1C 7.1*   CBG: Recent Labs  Lab 02/03/19 2108 02/04/19 0011  02/04/19 0436 02/04/19 0712 02/04/19 1137  GLUCAP 430* 309* 203* 155* 342*   Lipid Profile: No results for input(s): CHOL, HDL, LDLCALC, TRIG, CHOLHDL, LDLDIRECT in the last 72 hours. Thyroid Function Tests: No results for input(s): TSH, T4TOTAL, FREET4, T3FREE, THYROIDAB in the last 72 hours. Anemia Panel: Recent Labs    02/03/19 0643 02/04/19 0345  FERRITIN 202 227   Sepsis Labs: Recent Labs  Lab 02/02/19 2139 02/03/19 0643  PROCALCITON <0.10 <0.10    No results found for this or any previous visit (from the past 240 hour(s)).       Radiology Studies: DG Chest Portable 1 View  Result Date: 02/02/2019 CLINICAL  DATA:  Cough and fever. EXAM: PORTABLE CHEST 1 VIEW COMPARISON:  05/23/2006 FINDINGS: In the cardiac silhouette is significantly enlarged. There are pulmonary opacities bilaterally. There may be small bilateral pleural effusions. There is no pneumothorax. There is no acute osseous abnormality. IMPRESSION: Diffuse bilateral pulmonary opacities concerning for an atypical infectious process such as viral pneumonia or developing pulmonary edema. Electronically Signed   By: Constance Holster M.D.   On: 02/02/2019 21:50        Scheduled Meds: . amLODipine  5 mg Oral Daily  . vitamin C  500 mg Oral Daily  . atenolol  50 mg Oral Daily  . atorvastatin  10 mg Oral Daily  . docusate sodium  100 mg Oral Daily  . enoxaparin (LOVENOX) injection  85 mg Subcutaneous Q24H  . furosemide  40 mg Oral BID  . insulin aspart  0-20 Units Subcutaneous Q4H  . insulin detemir  0.15 Units/kg Subcutaneous BID  . Ipratropium-Albuterol  1 puff Inhalation Q6H  . lisinopril  40 mg Oral Daily  . methylPREDNISolone (SOLU-MEDROL) injection  60 mg Intravenous Q8H  . zinc sulfate  220 mg Oral Daily   Continuous Infusions: . remdesivir 100 mg in NS 100 mL 100 mg (02/04/19 1026)     LOS: 1 day    Time spent: over 54 min    Fayrene Helper, MD [facility] Pager AMION  If  7PM-7AM, please contact night-coverage [email or web address] Password [email or web address] 02/04/2019, 1:34 PM

## 2019-02-04 NOTE — Progress Notes (Signed)
Occupational Therapy Evaluation Patient Details Name: Sara Zhang MRN: [id number] DOB: 02-02-1971 Today's Date: 02/04/2019    History of Present Illness Sara Zhang  is a 48 y.o. female, with IDDM, CKD, HTN, HLD, chronic hypoxic respiratory failure on 2 L [place] O2, HFpEF who presented to [facility] with progressive shortness of breath, cough, found to be in acute on chronic respiratory failure with hypoxia secondary to SARS-CoV-2 pneumonia.  She reports she started feeling unwell about a week ago, initially she had subjective fevers with chills accompanied by intermittent cough.  However, over the last couple days she has had progressive shortness of breath prompting the ER presentation.  Cough is intermittently productive of yellow sputum.  She denies any hemoptysis.  She denies any chest pain, palpitations, orthopnea, PND.  No sick contacts.  No recent travel.   Clinical Impression   PTA pt lived with her father, independent in all ADL, IADL, and mobility tasks. Pt does not ambulate with an assistive and reports 0 falls. Pt uses 2L of oxygen at home and is currently on 6L [place] at the hospital. Pt currently independent to supervision for all self-care and mobility tasks. Pt able to ambulate to/from bathroom and complete toileting task, noting 0 instances of loss of balance. SpO2 decreased to mid 70s following task with pt requiring ~1 min seated rest break to recover back to 90s. 2/4 DOE. Educated and provided pt with handouts in regards to energy conservation and pursed lip breathing. Pt demonstrates decreased endurance, standing tolerance, and activity tolerance impacting ability to complete self-care and functional transfer tasks. Recommend skilled OT services to address above deficits in order to promote function and prevent further decline.    Follow Up Recommendations  No OT follow up    Equipment Recommendations  3 in 1 bedside commode(for use in shower)    Recommendations for Other Services        Precautions / Restrictions Precautions Precautions: None Restrictions Weight Bearing Restrictions: No      Mobility Bed Mobility               General bed mobility comments: Pt seated in bedside chair upon OT arrival.  Transfers Overall transfer level: Independent Equipment used: None                  Balance Overall balance assessment: No apparent balance deficits (not formally assessed)                                         ADL either performed or assessed with clinical judgement   ADL Overall ADL's : Needs assistance/impaired Eating/Feeding: Independent;Sitting   Grooming: Supervision/safety;Standing Grooming Details (indicate cue type and reason): for line management only Upper Body Bathing: Supervision/ safety;Standing Upper Body Bathing Details (indicate cue type and reason): for line management only Lower Body Bathing: Supervison/ safety;Sit to/from stand Lower Body Bathing Details (indicate cue type and reason): for line management only Upper Body Dressing : Independent;Sitting   Lower Body Dressing: Supervision/safety;Sit to/from stand;Sitting/lateral leans   Toilet Transfer: Supervision/safety;Ambulation;Regular Glass blower/designer Details (indicate cue type and reason): for line management only Toileting- Clothing Manipulation and Hygiene: Independent;Sitting/lateral lean;Sit to/from stand;Supervision/safety       Functional mobility during ADLs: Supervision/safety General ADL Comments: Pt able to ambulate to/from bathroom without an assistive device. Supervision for line management only.     Vision Baseline Vision/History: Wears glasses Wears  Glasses: At all times       Perception     Praxis      Pertinent Vitals/Pain Pain Assessment: No/denies pain     Hand Dominance Right   Extremity/Trunk Assessment Upper Extremity Assessment Upper Extremity Assessment: Overall WFL for tasks assessed   Lower  Extremity Assessment Lower Extremity Assessment: Defer to PT evaluation       Communication Communication Communication: No difficulties   Cognition Arousal/Alertness: Awake/alert Behavior During Therapy: WFL for tasks assessed/performed Overall Cognitive Status: Within Functional Limits for tasks assessed                                     General Comments  Pt currently on 6L HFNC with SpO2 92% at rest. Pt able to ambulate to/from bathroom and complete toileting task. SpO2 dropped to mid 70s with pt requiring ~1 min seated rest break to recover back to 90s. Pt reports min SOB throughout. Educated and provided pt with handout in regards to energy conservation and pursed lip breathing.    Exercises Exercises: Other exercises Other Exercises Other Exercises: Incentive spirometer x 10. Pulling   Shoulder Instructions      Home Living Family/patient expects to be discharged to:: Private residence Living Arrangements: Other (Comment)(Father) Available Help at Discharge: Family Type of Home: House Home Access: Stairs to enter [employer] of Steps: 2-3   Home Layout: One level     Bathroom Shower/Tub: Chief Strategy Officer: Standard     Home Equipment: None          Prior Functioning/Environment Level of Independence: Independent        Comments: Pt independent in ADLs, IADLs, and mobility. Pt does not use an assistive device at home. Pt reports 0 falls in the last 6 months. Pt uses 2L O2 at home. Pt still drives.        OT Problem List: Decreased strength;Decreased activity tolerance;Cardiopulmonary status limiting activity      OT Treatment/Interventions: Self-care/ADL training;Therapeutic exercise;Neuromuscular education;Energy conservation;DME and/or AE instruction;Therapeutic activities;Patient/family education;Balance training    OT Goals(Current goals can be found in the care plan section) Acute Rehab OT  Goals Patient Stated Goal: To go home Time For Goal Achievement: 02/18/19 Potential to Achieve Goals: Good ADL Goals Pt Will Perform Lower Body Bathing: Independently;sit to/from stand Pt Will Perform Lower Body Dressing: Independently;sit to/from stand Additional ADL Goal #1: Pt to recall and verbalize 3 energy conservation strategies with 0 verbal cues. Additional ADL Goal #2: Pt to tolerate standing up to 10 min independently with SpO2 maintaining in 90s, in preparation for ADLs.  OT Frequency: Min 2X/week   Barriers to D/C:            Co-evaluation              AM-PAC OT "6 Clicks" Daily Activity     Outcome Measure Help from another person eating meals?: None Help from another person taking care of personal grooming?: A Little Help from another person toileting, which includes using toliet, bedpan, or urinal?: A Little Help from another person bathing (including washing, rinsing, drying)?: A Little Help from another person to put on and taking off regular upper body clothing?: None Help from another person to put on and taking off regular lower body clothing?: A Little 6 Click Score: 20   End of Session Equipment Utilized During Treatment: Oxygen Nurse Communication: Mobility  status  Activity Tolerance: Patient tolerated treatment well Patient left: in chair;with call bell/phone within reach  OT Visit Diagnosis: Unsteadiness on feet (R26.81);Muscle weakness (generalized) (M62.81)                Time: 8786-7672 OT Time Calculation (min): 31 min Charges:  OT General Charges $OT Visit: 1 Visit OT Evaluation $OT Eval Low Complexity: 1 Low OT Treatments $Self Care/Home Management : 8-22 mins  Peterson Ao OTR/L [phone]   Peterson Ao 02/04/2019, 5:36 PM

## 2019-02-05 LAB — COMPREHENSIVE METABOLIC PANEL
ALT: 18 U/L (ref 0–44)
AST: 17 U/L (ref 15–41)
Albumin: 2.7 g/dL — ABNORMAL LOW (ref 3.5–5.0)
Alkaline Phosphatase: 60 U/L (ref 38–126)
Anion gap: 9 (ref 5–15)
BUN: 34 mg/dL — ABNORMAL HIGH (ref 6–20)
CO2: 31 mmol/L (ref 22–32)
Calcium: 7.9 mg/dL — ABNORMAL LOW (ref 8.9–10.3)
Chloride: 105 mmol/L (ref 98–111)
Creatinine, Ser: 1.03 mg/dL — ABNORMAL HIGH (ref 0.44–1.00)
GFR calc Af Amer: >60 mL/min (ref >60)
GFR calc non Af Amer: >60 mL/min (ref >60)
Glucose, Bld: 131 mg/dL — ABNORMAL HIGH (ref 70–99)
Potassium: 4.1 mmol/L (ref 3.5–5.1)
Sodium: 145 mmol/L (ref 135–145)
Total Bilirubin: 0.2 mg/dL — ABNORMAL LOW (ref 0.3–1.2)
Total Protein: 7.2 g/dL (ref 6.5–8.1)

## 2019-02-05 LAB — CBC WITH DIFFERENTIAL/PLATELET
Abs Immature Granulocytes: 0.06 10*3/uL (ref 0.00–0.07)
Basophils Absolute: 0 10*3/uL (ref 0.0–0.1)
Basophils Relative: 0 %
Eosinophils Absolute: 0 10*3/uL (ref 0.0–0.5)
Eosinophils Relative: 0 %
HCT: 37.7 % (ref 36.0–46.0)
Hemoglobin: 10.8 g/dL — ABNORMAL LOW (ref 12.0–15.0)
Immature Granulocytes: 1 %
Lymphocytes Relative: 9 %
Lymphs Abs: 1 10*3/uL (ref 0.7–4.0)
MCH: 25.1 pg — ABNORMAL LOW (ref 26.0–34.0)
MCHC: 28.6 g/dL — ABNORMAL LOW (ref 30.0–36.0)
MCV: 87.5 fL (ref 80.0–100.0)
Monocytes Absolute: 0.4 10*3/uL (ref 0.1–1.0)
Monocytes Relative: 4 %
Neutro Abs: 9.1 10*3/uL — ABNORMAL HIGH (ref 1.7–7.7)
Neutrophils Relative %: 86 %
Platelets: 346 10*3/uL (ref 150–400)
RBC: 4.31 MIL/uL (ref 3.87–5.11)
RDW: 15.4 % (ref 11.5–15.5)
WBC: 10.5 10*3/uL (ref 4.0–10.5)
nRBC: 0 % (ref 0.0–0.2)

## 2019-02-05 LAB — GLUCOSE, CAPILLARY
Glucose-Capillary: 126 mg/dL — ABNORMAL HIGH (ref 70–99)
Glucose-Capillary: 138 mg/dL — ABNORMAL HIGH (ref 70–99)
Glucose-Capillary: 252 mg/dL — ABNORMAL HIGH (ref 70–99)
Glucose-Capillary: 335 mg/dL — ABNORMAL HIGH (ref 70–99)
Glucose-Capillary: 313 mg/dL — ABNORMAL HIGH (ref 70–99)

## 2019-02-05 LAB — FERRITIN: Ferritin: 199 ng/mL (ref 11–307)

## 2019-02-05 LAB — PHOSPHORUS: Phosphorus: 3.6 mg/dL (ref 2.5–4.6)

## 2019-02-05 LAB — D-DIMER, QUANTITATIVE: D-Dimer, Quant: 0.66 ug/mL-FEU — ABNORMAL HIGH (ref 0.00–0.50)

## 2019-02-05 LAB — MAGNESIUM: Magnesium: 2.3 mg/dL (ref 1.7–2.4)

## 2019-02-05 LAB — C-REACTIVE PROTEIN: CRP: 4.7 mg/dL — ABNORMAL HIGH (ref <1.0)

## 2019-02-05 MED ORDER — INSULIN ASPART 100 UNIT/ML ~~LOC~~ SOLN
0.0000 [IU] | Freq: Three times a day (TID) | SUBCUTANEOUS | Status: DC
  Administered 2019-02-05: 3 [IU] via SUBCUTANEOUS
  Administered 2019-02-05: 18:00:00 15 [IU] via SUBCUTANEOUS
  Administered 2019-02-05: 11 [IU] via SUBCUTANEOUS
  Administered 2019-02-06: 7 [IU] via SUBCUTANEOUS
  Administered 2019-02-06: 15 [IU] via SUBCUTANEOUS
  Administered 2019-02-06: 14:00:00 20 [IU] via SUBCUTANEOUS
  Administered 2019-02-07: 13:00:00 4 [IU] via SUBCUTANEOUS

## 2019-02-05 MED ORDER — INSULIN ASPART 100 UNIT/ML ~~LOC~~ SOLN
0.0000 [IU] | Freq: Every day | SUBCUTANEOUS | Status: DC
  Administered 2019-02-05: 4 [IU] via SUBCUTANEOUS
  Administered 2019-02-06: 3 [IU] via SUBCUTANEOUS

## 2019-02-05 MED ORDER — INSULIN ASPART 100 UNIT/ML ~~LOC~~ SOLN
10.0000 [IU] | Freq: Three times a day (TID) | SUBCUTANEOUS | Status: DC
  Administered 2019-02-05: 10 [IU] via SUBCUTANEOUS

## 2019-02-05 MED ORDER — INSULIN ASPART 100 UNIT/ML ~~LOC~~ SOLN
14.0000 [IU] | Freq: Three times a day (TID) | SUBCUTANEOUS | Status: DC
  Administered 2019-02-05 – 2019-02-06 (×4): 14 [IU] via SUBCUTANEOUS

## 2019-02-05 MED ORDER — LINAGLIPTIN 5 MG PO TABS
5.0000 mg | ORAL_TABLET | Freq: Every day | ORAL | Status: DC
  Administered 2019-02-05 – 2019-02-07 (×3): 5 mg via ORAL
  Filled 2019-02-05 (×3): qty 1

## 2019-02-05 NOTE — Progress Notes (Signed)
PROGRESS NOTE    Sara Zhang  XHB:[id number] DOB: Mar 27, 1970 DOA: 02/03/2019 PCP: Marjie Skiff, NP   Brief Narrative:  Sara Zhang  is Sara Zhang 49 y.o. female, with IDDM, CKD, HTN, HLD, chronic hypoxic respiratory failure on 2 L [place] O2, HFpEF who presented to [facility] with progressive shortness of breath, cough, found to be in acute on chronic respiratory failure with hypoxia secondary to SARS-CoV-2 pneumonia.  She reports she started feeling unwell about Jacole Capley week ago, initially she had subjective fevers with chills accompanied by intermittent cough.  However, over the last couple days she has had progressive shortness of breath prompting the ER presentation.  Cough is intermittently productive of yellow sputum.  She denies any hemoptysis.  She denies any chest pain, palpitations, orthopnea, PND.  No sick contacts.  No recent travel.  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  Discussed risks, benefits, unclear evidence.  She denies hx hepatitis or tuberculosis.  She was agreeable if she were to need this if she worsened. Daily inflammatory labs I/O, daily weights  COVID-19 Labs  Recent Labs    02/02/19 2139 02/03/19 0643 02/04/19 0345 02/05/19 0632  DDIMER  --  1.08* 0.77* 0.66*  FERRITIN 221 202 227 199    LDH 251*  --   --   --   CRP  --  10.9* 9.5* 4.7*    No results found for: SARSCOV2NAA  T2DM  HLD:  Continue lipitor (uses 54 units toujeo at home).  Continue BID levemir and mealtime insulin.  SSI.  Tradjenta.  Holding home oral antihyperglycemics.  Hypertension: continue amlodipine, atenolol, lasix   CKD Stage II: follow with lasix, lisinopril  Obstructive sleep apnea: CPAP on hold given COVID positive.  Follow with continuous pulse ox and O2.  Morbid obesity/BMI 50.0-59.9, adult (HCC):Obesity affects all facets of care.  Likely secondary to excessive caloric intake.  Encourage patient to reduce caloric intake while increasing physical activity and engage in other lifestyle changes that may result in weight loss.   Chronic heart failure with preserved ejection fraction: Continue home lasix, lisinopril.   Chronic Hypoxic Respiratory Failure: uses 2 L at baseline.   DVT prophylaxis: lovenox Code Status: full  Family Communication: none at bedside - declined me calling Disposition Plan: pending further improvement   Consultants:   none  Procedures:   none  Antimicrobials:  Anti-infectives (From admission, onward)   Start     Dose/Rate Route Frequency Ordered Stop   02/04/19 1000  remdesivir 100 mg in sodium chloride 0.9 % 100 mL IVPB     100 mg 200 mL/hr over 30 Minutes Intravenous Daily 02/03/19 0624 02/08/19 0959   02/03/19 1000  remdesivir 200 mg in sodium chloride 0.9% 250 mL IVPB     200 mg 580 mL/hr over 30 Minutes Intravenous Once 02/03/19 8101  02/03/19 1140      Subjective: Feeling better gradually  Objective: Vitals:   02/05/19 0444 02/05/19 0723 02/05/19 0802 02/05/19 1111  BP: (!) 111/91 94/75 104/67 113/60  Pulse: 76 72  83  Resp: 20 20  (!) 24  Temp: 97.9 F (36.6 C) 98 F (36.7 C)  97.9 F (36.6 C)  TempSrc: Oral Oral  Oral  SpO2: 94% 97%  90%  Weight:      Height:        Intake/Output Summary (Last 24 hours) at 02/05/2019 1139 Last  data filed at 02/05/2019 0451 Gross per 24 hour  Intake 960 ml  Output 1300 ml  Net -340 ml   Filed Weights   02/03/19 0655  Weight: (!) 169 kg    Examination:  General: No acute distress. Cardiovascular: RRR Lungs: Clear to auscultation bilaterally Abdomen: Soft, nontender, nondistended Neurological: Alert and oriented 3. Moves all extremities 4. Cranial nerves II through XII grossly intact. Skin: Warm and dry. No rashes or lesions. Extremities: No clubbing or cyanosis. Trace edema    Data Reviewed: I have personally reviewed following labs and imaging studies  CBC: Recent Labs  Lab 02/02/19 2139 02/03/19 0643 02/04/19 0345 02/05/19 0632  WBC 6.8 6.2 7.2 10.5  NEUTROABS  --  5.2 5.8 9.1*  HGB 11.5* 11.2* 11.4* 10.8*  HCT 37.1 38.7 39.4 37.7  MCV 81.4 88.0 87.0 87.5  PLT 308 288 305 008   Basic Metabolic Panel: Recent Labs  Lab 02/02/19 2139 02/03/19 0643 02/03/19 2210 02/04/19 0345 02/05/19 0632  NA 139 141  --  143 145  K 3.4* 4.1  --  4.0 4.1  CL 102 103  --  103 105  CO2 25 26  --  28 31  GLUCOSE 169* 184* 387* 224* 131*  BUN 19 22*  --  35* 34*  CREATININE 1.37* 1.26*  --  1.23* 1.03*  CALCIUM 7.4* 7.4*  --  7.8* 7.9*  MG  --   --   --  2.4 2.3  PHOS  --   --   --  3.6 3.6   GFR: Estimated Creatinine Clearance: 110.7 mL/min (Dustina Scoggin) (by C-G formula based on SCr of 1.03 mg/dL (H)). Liver Function Tests: Recent Labs  Lab 02/02/19 2139 02/03/19 0643 02/04/19 0345 02/05/19 0632  AST 24 24 20 17   ALT 18 18 19 18   ALKPHOS 72 68 61 60  BILITOT 0.8 0.5 0.4 0.2*  PROT 7.9 7.4 7.8 7.2  ALBUMIN 3.2* 2.8* 2.9* 2.7*   No results for input(s): LIPASE, AMYLASE in the last 168 hours. No results for input(s): AMMONIA in the last 168 hours. Coagulation Profile: No results for input(s): INR, PROTIME in the last 168 hours. Cardiac Enzymes: No results for input(s): CKTOTAL, CKMB, CKMBINDEX, TROPONINI in the last 168 hours. BNP (last 3 results) No results for  input(s): PROBNP in the last 8760 hours. HbA1C: Recent Labs    02/03/19 0643  HGBA1C 7.1*   CBG: Recent Labs  Lab 02/04/19 1949 02/04/19 2147 02/05/19 0442 02/05/19 0722 02/05/19 1109  GLUCAP 364* 267* 126* 138* 252*   Lipid Profile: No results for input(s): CHOL, HDL, LDLCALC, TRIG, CHOLHDL, LDLDIRECT in the last 72 hours. Thyroid Function Tests: No results for input(s): TSH, T4TOTAL, FREET4, T3FREE, THYROIDAB in the last 72 hours. Anemia Panel: Recent Labs    02/04/19 0345 02/05/19 0632  FERRITIN 227 199   Sepsis Labs: Recent Labs  Lab 02/02/19 2139 02/03/19 0643  PROCALCITON <0.10 <0.10  No results found for this or any previous visit (from the past 240 hour(s)).       Radiology Studies: No results found.      Scheduled Meds: . amLODipine  5 mg Oral Daily  . vitamin C  500 mg Oral Daily  . atenolol  50 mg Oral Daily  . atorvastatin  10 mg Oral Daily  . docusate sodium  100 mg Oral Daily  . enoxaparin (LOVENOX) injection  85 mg Subcutaneous Q24H  . furosemide  40 mg Oral BID  . insulin aspart  0-20 Units Subcutaneous TID WC  . insulin aspart  0-5 Units Subcutaneous QHS  . insulin aspart  10 Units Subcutaneous TID WC  . insulin detemir  0.15 Units/kg Subcutaneous BID  . Ipratropium-Albuterol  1 puff Inhalation Q6H  . linagliptin  5 mg Oral Daily  . lisinopril  40 mg Oral Daily  . methylPREDNISolone (SOLU-MEDROL) injection  60 mg Intravenous Q8H  . zinc sulfate  220 mg Oral Daily   Continuous Infusions: . remdesivir 100 mg in NS 100 mL 100 mg (02/05/19 0913)     LOS: 2 days    Time spent: over 30 min    Lacretia Nicks, MD [facility] Pager AMION  If 7PM-7AM, please contact night-coverage [email or web address] Password [facility] 02/05/2019, 11:39 AM

## 2019-02-05 NOTE — Evaluation (Signed)
Physical Therapy Evaluation Patient Details Name: Sara Zhang MRN: [id number] DOB: 09-23-1970 Today's Date: 02/05/2019   History of Present Illness  Sara Zhang  is a 49 y.o. female, with IDDM, CKD, HTN, HLD, chronic hypoxic respiratory failure on 2 L [place] O2, HFpEF who presented to [facility] with progressive shortness of breath, cough, found to be in acute on chronic respiratory failure with hypoxia secondary to SARS-CoV-2 pneumonia.  She reports she started feeling unwell about a week ago, initially she had subjective fevers with chills accompanied by intermittent cough.  However, over the last couple days she has had progressive shortness of breath prompting the ER presentation.  Cough is intermittently productive of yellow sputum.  She denies any hemoptysis.  She denies any chest pain, palpitations, orthopnea, PND.  No sick contacts.  No recent travel.  Clinical Impression   Pt admitted with above diagnosis. PTA lived home with family and was independent with all ADLs and IADLs, pt states she uses 2L/min 02 at home. Pt currently with functional limitations due to the deficits listed below (see PT Problem List). This am pt is doing very well with mobility she was able to complete room level activities with mod I and ambulate down hall with SBA approx 126ft, pt on 3L/min this am and maintains sats in 90s at rest, with ambulation min desat to 85%, w/ pursed lip breathing pt able to recover very rapidly. Pt will benefit from skilled PT to increase her activity tolerance,  independence and safety with mobility to allow discharge back home with family.       Follow Up Recommendations No PT follow up    Equipment Recommendations  None recommended by PT    Recommendations for Other Services       Precautions / Restrictions Precautions Precautions: None Restrictions Weight Bearing Restrictions: No      Mobility  Bed Mobility               General bed mobility comments: pt in recliner at  therapist arrival to room  Transfers Overall transfer level: Modified independent Equipment used: None                Ambulation/Gait Ambulation/Gait assistance: Supervision Gait Distance (Feet): 150 Feet Assistive device: None Gait Pattern/deviations: Step-through pattern Gait velocity: fair   General Gait Details: ambulated approx 146ft with no AD and SBA, pt on 3L/min via [place] and min desat to 85% with ambulation  Stairs            Wheelchair Mobility    Modified Rankin (Stroke Patients Only)       Balance Overall balance assessment: No apparent balance deficits (not formally assessed)                                           Pertinent Vitals/Pain Pain Assessment: No/denies pain    Home Living Family/patient expects to be discharged to:: Private residence Living Arrangements: Parent Available Help at Discharge: Family Type of Home: House Home Access: Stairs to enter   Technical brewer of Steps: 2-3 Home Layout: One level Home Equipment: None      Prior Function Level of Independence: Independent               Hand Dominance   Dominant Hand: Right    Extremity/Trunk Assessment   Upper Extremity Assessment Upper Extremity Assessment: Overall WFL for tasks  assessed    Lower Extremity Assessment Lower Extremity Assessment: Overall WFL for tasks assessed    Cervical / Trunk Assessment Cervical / Trunk Assessment: Normal  Communication   Communication: No difficulties  Cognition Arousal/Alertness: Awake/alert Behavior During Therapy: WFL for tasks assessed/performed Overall Cognitive Status: Within Functional Limits for tasks assessed                                        General Comments      Exercises Other Exercises Other Exercises: reinforced incentive spirometer use Other Exercises: initiated flutter valve use   Assessment/Plan    PT Assessment Patient needs continued PT  services  PT Problem List Decreased activity tolerance       PT Treatment Interventions Gait training;Functional mobility training;Therapeutic exercise;Neuromuscular re-education    PT Goals (Current goals can be found in the Care Plan section)  Acute Rehab PT Goals Patient Stated Goal: To go home PT Goal Formulation: With patient Time For Goal Achievement: 02/19/19 Potential to Achieve Goals: Good    Frequency Min 3X/week   Barriers to discharge        Co-evaluation               AM-PAC PT "6 Clicks" Mobility  Outcome Measure Help needed turning from your back to your side while in a flat bed without using bedrails?: A Little Help needed moving from lying on your back to sitting on the side of a flat bed without using bedrails?: None Help needed moving to and from a bed to a chair (including a wheelchair)?: None Help needed standing up from a chair using your arms (e.g., wheelchair or bedside chair)?: None Help needed to walk in hospital room?: A Little Help needed climbing 3-5 steps with a railing? : A Little 6 Click Score: 21    End of Session Equipment Utilized During Treatment: Oxygen Activity Tolerance: Patient tolerated treatment well Patient left: in chair;with call bell/phone within reach Nurse Communication: Mobility status PT Visit Diagnosis: Other abnormalities of gait and mobility (R26.89)    Time: 1610-9604 PT Time Calculation (min) (ACUTE ONLY): 13 min   Charges:   PT Evaluation $PT Eval Low Complexity: 1 Low          Drema Pry, PT   Freddi Starr 02/05/2019, 12:24 PM

## 2019-02-05 NOTE — TOC Initial Note (Signed)
Transition of Care [facility]) - Initial/Assessment Note    Patient Details  Name: Sara Zhang MRN: [id number] Date of Birth: 12/13/1970  Transition [facility] Care [facility]) CM/SW Contact:    Durenda Guthrie, RN Phone Number: [phone] (working remotely) 02/05/2019, 9:12 AM  Clinical Narrative:  Patient  is a48 y.o.female,from home, with IDDM, CKD, HTN, HLD, chronic hypoxic respiratory failure on 2 L [place] O2,HFpEFwho presented to [facility] with progressive shortness of breath, cough, found to be in acute on chronic respiratory failure with hypoxia secondary to SARS-CoV-2 pneumonia. Patient was admitted and [facility]. Currently on 6L [place], receiving Remdesivir until 02/08/19, IV decadron. Case manager will continue to monitor.        Patient Goals and CMS Choice        Expected Discharge Plan and Services                                                Prior Living Arrangements/Services                       Activities of Daily Living Home Assistive Devices/Equipment: None ADL Screening (condition at time of admission) Patient's cognitive ability adequate to safely complete daily activities?: Yes Is the patient deaf or have difficulty hearing?: No Does the patient have difficulty seeing, even when wearing glasses/contacts?: No Does the patient have difficulty concentrating, remembering, or making decisions?: No Patient able to express need for assistance with ADLs?: Yes Does the patient have difficulty dressing or bathing?: No Independently performs ADLs?: Yes (appropriate for developmental age) Does the patient have difficulty walking or climbing stairs?: No Weakness of Legs: Both Weakness of Arms/Hands: None  Permission Sought/Granted                  Emotional Assessment              Admission diagnosis:  Acute hypoxemic respiratory failure due to severe acute respiratory syndrome coronavirus 2 (SARS-CoV-2) disease  (HCC) [U07.1, J96.01] Patient Active Problem List   Diagnosis Date Noted  . Acute hypoxemic respiratory failure due to severe acute respiratory syndrome coronavirus 2 (SARS-CoV-2) disease (HCC) 02/03/2019  . Chronic heart failure with preserved ejection fraction (HCC) 10/22/2018  . NICM (nonischemic cardiomyopathy) (HCC) 10/22/2018  . Lower extremity edema 10/22/2018  . BMI 50.0-59.9, adult (HCC) 11/18/2017  . Elevated TSH 12/09/2016  . Chronic diastolic heart failure (HCC) 10/30/2016  . Essential hypertension 10/30/2016  . Iron deficiency anemia due to chronic blood loss 05/27/2016  . Type 2 diabetes mellitus treated with insulin (HCC) 04/25/2015  . Hyperlipidemia associated with type 2 diabetes mellitus (HCC) 11/10/2014  . Obstructive sleep apnea 11/10/2014  . Morbid obesity (HCC) 11/10/2014  . Vitamin D deficiency 11/10/2014  . CKD (chronic kidney disease), stage II 11/10/2014  . Benign hypertensive heart and kidney disease with CHF and stage 2 chronic kidney disease (HCC) 11/10/2014   PCP:  Marjie Skiff, NP Pharmacy:   [facility] [phone] Nicholes Rough,  - [address] AT [facility] [address] [place] [address] Phone: [phone] Fax: [phone]     Social Determinants of Health (SDOH) Interventions    Readmission Risk Interventions No flowsheet data found.

## 2019-02-05 NOTE — Plan of Care (Signed)
  Problem: Clinical Measurements: Goal: Will remain free from infection Outcome: Progressing   Problem: Education: Goal: Knowledge of risk factors and measures for prevention of condition will improve Outcome: Progressing   Problem: Coping: Goal: Psychosocial and spiritual needs will be supported Outcome: Progressing   Problem: Respiratory: Goal: Will maintain a patent airway Outcome: Progressing Goal: Complications related to the disease process, condition or treatment will be avoided or minimized Outcome: Progressing

## 2019-02-06 LAB — CBC WITH DIFFERENTIAL/PLATELET
Abs Immature Granulocytes: 0.13 10*3/uL — ABNORMAL HIGH (ref 0.00–0.07)
Basophils Absolute: 0 10*3/uL (ref 0.0–0.1)
Basophils Relative: 0 %
Eosinophils Absolute: 0 10*3/uL (ref 0.0–0.5)
Eosinophils Relative: 0 %
HCT: 36.7 % (ref 36.0–46.0)
Hemoglobin: 10.5 g/dL — ABNORMAL LOW (ref 12.0–15.0)
Immature Granulocytes: 1 %
Lymphocytes Relative: 11 %
Lymphs Abs: 1.1 10*3/uL (ref 0.7–4.0)
MCH: 24.7 pg — ABNORMAL LOW (ref 26.0–34.0)
MCHC: 28.6 g/dL — ABNORMAL LOW (ref 30.0–36.0)
MCV: 86.4 fL (ref 80.0–100.0)
Monocytes Absolute: 0.4 10*3/uL (ref 0.1–1.0)
Monocytes Relative: 4 %
Neutro Abs: 8.7 10*3/uL — ABNORMAL HIGH (ref 1.7–7.7)
Neutrophils Relative %: 84 %
Platelets: 348 10*3/uL (ref 150–400)
RBC: 4.25 MIL/uL (ref 3.87–5.11)
RDW: 15.3 % (ref 11.5–15.5)
WBC: 10.4 10*3/uL (ref 4.0–10.5)
nRBC: 0 % (ref 0.0–0.2)

## 2019-02-06 LAB — GLUCOSE, CAPILLARY
Glucose-Capillary: 230 mg/dL — ABNORMAL HIGH (ref 70–99)
Glucose-Capillary: 369 mg/dL — ABNORMAL HIGH (ref 70–99)
Glucose-Capillary: 326 mg/dL — ABNORMAL HIGH (ref 70–99)
Glucose-Capillary: 231 mg/dL — ABNORMAL HIGH (ref 70–99)

## 2019-02-06 LAB — FERRITIN: Ferritin: 173 ng/mL (ref 11–307)

## 2019-02-06 LAB — COMPREHENSIVE METABOLIC PANEL
ALT: 21 U/L (ref 0–44)
AST: 15 U/L (ref 15–41)
Albumin: 2.8 g/dL — ABNORMAL LOW (ref 3.5–5.0)
Alkaline Phosphatase: 61 U/L (ref 38–126)
Anion gap: 11 (ref 5–15)
BUN: 32 mg/dL — ABNORMAL HIGH (ref 6–20)
CO2: 29 mmol/L (ref 22–32)
Calcium: 7.7 mg/dL — ABNORMAL LOW (ref 8.9–10.3)
Chloride: 99 mmol/L (ref 98–111)
Creatinine, Ser: 0.99 mg/dL (ref 0.44–1.00)
GFR calc Af Amer: >60 mL/min (ref >60)
GFR calc non Af Amer: >60 mL/min (ref >60)
Glucose, Bld: 240 mg/dL — ABNORMAL HIGH (ref 70–99)
Potassium: 4.4 mmol/L (ref 3.5–5.1)
Sodium: 139 mmol/L (ref 135–145)
Total Bilirubin: 0.7 mg/dL (ref 0.3–1.2)
Total Protein: 6.9 g/dL (ref 6.5–8.1)

## 2019-02-06 LAB — C-REACTIVE PROTEIN: CRP: 2.7 mg/dL — ABNORMAL HIGH (ref <1.0)

## 2019-02-06 LAB — PHOSPHORUS: Phosphorus: 3.3 mg/dL (ref 2.5–4.6)

## 2019-02-06 LAB — MAGNESIUM: Magnesium: 2.3 mg/dL (ref 1.7–2.4)

## 2019-02-06 LAB — D-DIMER, QUANTITATIVE: D-Dimer, Quant: 0.67 ug/mL-FEU — ABNORMAL HIGH (ref 0.00–0.50)

## 2019-02-06 MED ORDER — INSULIN ASPART 100 UNIT/ML ~~LOC~~ SOLN
18.0000 [IU] | Freq: Three times a day (TID) | SUBCUTANEOUS | Status: DC
  Administered 2019-02-06 – 2019-02-07 (×3): 18 [IU] via SUBCUTANEOUS

## 2019-02-06 MED ORDER — DEXAMETHASONE SODIUM PHOSPHATE 10 MG/ML IJ SOLN
6.0000 mg | INTRAMUSCULAR | Status: DC
  Administered 2019-02-06 – 2019-02-07 (×2): 6 mg via INTRAVENOUS
  Filled 2019-02-06 (×2): qty 1

## 2019-02-06 MED ORDER — INSULIN DETEMIR 100 UNIT/ML ~~LOC~~ SOLN
28.0000 [IU] | Freq: Two times a day (BID) | SUBCUTANEOUS | Status: DC
  Administered 2019-02-06 – 2019-02-07 (×3): 28 [IU] via SUBCUTANEOUS
  Filled 2019-02-06 (×4): qty 0.28

## 2019-02-06 NOTE — Plan of Care (Signed)
Tolerating 3L O2, baseline at home is 2L. Still has 2 doses of remdesivir, receiving IV steroids. Problem: Clinical Measurements: Goal: Will remain free from infection Outcome: Progressing   Problem: Education: Goal: Knowledge of risk factors and measures for prevention of condition will improve Outcome: Progressing   Problem: Respiratory: Goal: Will maintain a patent airway Outcome: Progressing Goal: Complications related to the disease process, condition or treatment will be avoided or minimized Outcome: Progressing   Problem: Coping: Goal: Psychosocial and spiritual needs will be supported Outcome: Adequate for Discharge

## 2019-02-06 NOTE — Progress Notes (Signed)
PROGRESS NOTE    Sara Zhang  YHC:[id number] DOB: [birth year] DOA: 02/03/2019 PCP: Venita Lick, NP   Brief Narrative:  Sara Zhang  is Sara Zhang 49 y.o. female, with IDDM, CKD, HTN, HLD, chronic hypoxic respiratory failure on 2 L [place] O2, HFpEF who presented to [facility] with progressive shortness of breath, cough, found to be in acute on chronic respiratory failure with hypoxia secondary to SARS-CoV-2 pneumonia.  She reports she started feeling unwell about [place] week ago, initially she had subjective fevers with chills accompanied by intermittent cough.  However, over the last couple days she has had progressive shortness of breath prompting the ER presentation.  Cough is intermittently productive of yellow sputum.  She denies any hemoptysis.  She denies any chest pain, palpitations, orthopnea, PND.  No sick contacts.  No recent travel.  Assessment & Plan:   Principal Problem:   Acute hypoxemic respiratory failure due to severe acute respiratory syndrome coronavirus 2 (SARS-CoV-2) disease (HCC) Active Problems:   Hyperlipidemia associated with type 2 diabetes mellitus (HCC)   Obstructive sleep apnea   Morbid obesity (HCC)   Benign hypertensive heart and kidney disease with CHF and stage 2 chronic kidney disease (HCC)   BMI 50.0-59.9, adult ([place])   Chronic heart failure with preserved ejection fraction (HCC)   Acute hypoxemic respiratory failure due to SARS-CoV-2 disease:  Weaned to 2 L at bedside this morning, discussed with RN Will try home O2 screen today.  She's at 2 L at baseline. Continue steroids, remdesivir (day 4) Discussed plasma, she's not interested in plasma at this time Discussed actemra, off label use.  Discussed risks, benefits, unclear evidence.  She denies hx hepatitis or tuberculosis.  She was agreeable if she were to need this if she worsened. Daily inflammatory labs I/O, daily weights  COVID-19 Labs  Recent Labs    02/04/19 0345 02/05/19 0632 02/06/19 0557  DDIMER  0.77* 0.66* 0.67*  FERRITIN 227 199 173  CRP 9.5* 4.7* 2.7*    No results found for: SARSCOV2NAA  T2DM  HLD:  Continue lipitor (uses 54 units toujeo at home).  Continue BID levemir and mealtime insulin.  SSI.  Tradjenta.  Adjust as needed. Holding home oral antihyperglycemics.  Hypertension: continue amlodipine, atenolol, lasix   CKD Stage II: follow with lasix, lisinopril  Obstructive sleep apnea: CPAP on hold given COVID positive.  Follow with continuous pulse ox and O2.  Morbid obesity/BMI 50.0-59.9, adult (HCC):Obesity affects all facets of care.  Likely secondary to excessive caloric intake.  Encourage patient to reduce caloric intake while increasing physical activity and engage in other lifestyle changes that may result in weight loss.   Chronic heart failure with preserved ejection fraction: Continue home lasix, lisinopril.   Chronic Hypoxic Respiratory Failure: uses 2 L at baseline.   DVT prophylaxis: lovenox Code Status: full  Family Communication: none at bedside - declined me calling Disposition Plan: pending further improvement   Consultants:   none  Procedures:   none  Antimicrobials:  Anti-infectives (From admission, onward)   Start     Dose/Rate Route Frequency Ordered Stop   02/04/19 1000  remdesivir 100 mg in sodium chloride 0.9 % 100 mL IVPB     100 mg 200 mL/hr over 30 Minutes Intravenous Daily 02/03/19 0624 02/08/19 0959   02/03/19 1000  remdesivir 200 mg in sodium chloride 0.9% 250 mL IVPB     200 mg 580 mL/hr over 30 Minutes Intravenous Once 02/03/19 0624 02/03/19 1140  Subjective: No new complaints.  Objective: Vitals:   02/05/19 1950 02/06/19 0012 02/06/19 0331 02/06/19 0800  BP:  113/71 134/84 126/73  Pulse:  62 75 69  Resp:  18 16 19   Temp: 97.8 F (36.6 C) 97.9 F (36.6 C) 98 F (36.7 C) 97.9 F (36.6 C)  TempSrc: Oral Oral Oral Oral  SpO2:  96% 91% 95%  Weight:      Height:       No intake or output data in the 24  hours ending 02/06/19 1356 Filed Weights   02/03/19 0655  Weight: (!) 169 kg    Examination:  General: No acute distress. Cardiovascular:RRR Lungs: Clear to auscultation bilaterally Abdomen: Soft, nontender, nondistended  Neurological: Alert and oriented 3. Moves all extremities 4. Cranial nerves II through XII grossly intact. Skin: Warm and dry. No rashes or lesions. Extremities: No clubbing or cyanosis. No edema.    Data Reviewed: I have personally reviewed following labs and imaging studies  CBC: Recent Labs  Lab 02/02/19 2139 02/03/19 0643 02/04/19 0345 02/05/19 0632 02/06/19 0557  WBC 6.8 6.2 7.2 10.5 10.4  NEUTROABS  --  5.2 5.8 9.1* 8.7*  HGB 11.5* 11.2* 11.4* 10.8* 10.5*  HCT 37.1 38.7 39.4 37.7 36.7  MCV 81.4 88.0 87.0 87.5 86.4  PLT 308 288 305 346 348   Basic Metabolic Panel: Recent Labs  Lab 02/02/19 2139 02/03/19 0643 02/03/19 2210 02/04/19 0345 02/05/19 0632 02/06/19 0557  NA 139 141  --  143 145 139  K 3.4* 4.1  --  4.0 4.1 4.4  CL 102 103  --  103 105 99  CO2 25 26  --  28 31 29   GLUCOSE 169* 184* 387* 224* 131* 240*  BUN 19 22*  --  35* 34* 32*  CREATININE 1.37* 1.26*  --  1.23* 1.03* 0.99  CALCIUM 7.4* 7.4*  --  7.8* 7.9* 7.7*  MG  --   --   --  2.4 2.3 2.3  PHOS  --   --   --  3.6 3.6 3.3   GFR: Estimated Creatinine Clearance: 115.2 mL/min (by C-G formula based on SCr of 0.99 mg/dL). Liver Function Tests: Recent Labs  Lab 02/02/19 2139 02/03/19 0643 02/04/19 0345 02/05/19 0632 02/06/19 0557  AST 24 24 20 17 15   ALT 18 18 19 18 21   ALKPHOS 72 68 61 60 61  BILITOT 0.8 0.5 0.4 0.2* 0.7  PROT 7.9 7.4 7.8 7.2 6.9  ALBUMIN 3.2* 2.8* 2.9* 2.7* 2.8*   No results for input(s): LIPASE, AMYLASE in the last 168 hours. No results for input(s): AMMONIA in the last 168 hours. Coagulation Profile: No results for input(s): INR, PROTIME in the last 168 hours. Cardiac Enzymes: No results for input(s): CKTOTAL, CKMB, CKMBINDEX, TROPONINI in  the last 168 hours. BNP (last 3 results) No results for input(s): PROBNP in the last 8760 hours. HbA1C: No results for input(s): HGBA1C in the last 72 hours. CBG: Recent Labs  Lab 02/05/19 1109 02/05/19 1715 02/05/19 2129 02/06/19 0719 02/06/19 1134  GLUCAP 252* 335* 313* 230* 369*   Lipid Profile: No results for input(s): CHOL, HDL, LDLCALC, TRIG, CHOLHDL, LDLDIRECT in the last 72 hours. Thyroid Function Tests: No results for input(s): TSH, T4TOTAL, FREET4, T3FREE, THYROIDAB in the last 72 hours. Anemia Panel: Recent Labs    02/05/19 0632 02/06/19 0557  FERRITIN 199 173   Sepsis Labs: Recent Labs  Lab 02/02/19 2139 02/03/19 0643  PROCALCITON <0.10 <0.10  No results found for this or any previous visit (from the past 240 hour(s)).       Radiology Studies: No results found.      Scheduled Meds: . amLODipine  5 mg Oral Daily  . vitamin C  500 mg Oral Daily  . atenolol  50 mg Oral Daily  . atorvastatin  10 mg Oral Daily  . dexamethasone (DECADRON) injection  6 mg Intravenous Q24H  . docusate sodium  100 mg Oral Daily  . enoxaparin (LOVENOX) injection  85 mg Subcutaneous Q24H  . furosemide  40 mg Oral BID  . insulin aspart  0-20 Units Subcutaneous TID WC  . insulin aspart  0-5 Units Subcutaneous QHS  . insulin aspart  14 Units Subcutaneous TID WC  . insulin detemir  28 Units Subcutaneous BID  . Ipratropium-Albuterol  1 puff Inhalation Q6H  . linagliptin  5 mg Oral Daily  . lisinopril  40 mg Oral Daily  . zinc sulfate  220 mg Oral Daily   Continuous Infusions: . remdesivir 100 mg in NS 100 mL 100 mg (02/06/19 1055)     LOS: 3 days    Time spent: over 30 min    Lacretia Nicks, MD [facility] Pager AMION  If 7PM-7AM, please contact night-coverage [email or web address] Password [email or web address] 02/06/2019, 1:56 PM

## 2019-02-06 NOTE — Progress Notes (Signed)
SATURATION QUALIFICATIONS: (This note is used to comply with regulatory documentation for home oxygen)  Patient Saturations on Room Air at Rest = 82%  Patient Saturations on Room Air while Ambulating = N/A  Patient Saturations on 2 Liters of oxygen while Ambulating = 86-94%  Please briefly explain why patient needs home oxygen: Patient desaturates quickly at rest when not wearing any oxygen. While ambulating, patient was on 2Lpm via [place] and still desaturated to the 80's 3 times during our one lap around the unit. Patient was on 2L via [place] at baseline at home prior to being admitted to the hospital.

## 2019-02-07 LAB — GLUCOSE, CAPILLARY
Glucose-Capillary: 110 mg/dL — ABNORMAL HIGH (ref 70–99)
Glucose-Capillary: 194 mg/dL — ABNORMAL HIGH (ref 70–99)

## 2019-02-07 LAB — CBC WITH DIFFERENTIAL/PLATELET
Abs Immature Granulocytes: 0.27 10*3/uL — ABNORMAL HIGH (ref 0.00–0.07)
Basophils Absolute: 0 10*3/uL (ref 0.0–0.1)
Basophils Relative: 0 %
Eosinophils Absolute: 0 10*3/uL (ref 0.0–0.5)
Eosinophils Relative: 0 %
HCT: 38.4 % (ref 36.0–46.0)
Hemoglobin: 11.1 g/dL — ABNORMAL LOW (ref 12.0–15.0)
Immature Granulocytes: 2 %
Lymphocytes Relative: 15 %
Lymphs Abs: 1.9 10*3/uL (ref 0.7–4.0)
MCH: 24.9 pg — ABNORMAL LOW (ref 26.0–34.0)
MCHC: 28.9 g/dL — ABNORMAL LOW (ref 30.0–36.0)
MCV: 86.3 fL (ref 80.0–100.0)
Monocytes Absolute: 1.3 10*3/uL — ABNORMAL HIGH (ref 0.1–1.0)
Monocytes Relative: 10 %
Neutro Abs: 9.3 10*3/uL — ABNORMAL HIGH (ref 1.7–7.7)
Neutrophils Relative %: 73 %
Platelets: 378 10*3/uL (ref 150–400)
RBC: 4.45 MIL/uL (ref 3.87–5.11)
RDW: 15.3 % (ref 11.5–15.5)
WBC: 12.8 10*3/uL — ABNORMAL HIGH (ref 4.0–10.5)
nRBC: 0 % (ref 0.0–0.2)

## 2019-02-07 LAB — D-DIMER, QUANTITATIVE: D-Dimer, Quant: 0.71 ug/mL-FEU — ABNORMAL HIGH (ref 0.00–0.50)

## 2019-02-07 LAB — COMPREHENSIVE METABOLIC PANEL
ALT: 26 U/L (ref 0–44)
AST: 18 U/L (ref 15–41)
Albumin: 2.9 g/dL — ABNORMAL LOW (ref 3.5–5.0)
Alkaline Phosphatase: 65 U/L (ref 38–126)
Anion gap: 9 (ref 5–15)
BUN: 36 mg/dL — ABNORMAL HIGH (ref 6–20)
CO2: 32 mmol/L (ref 22–32)
Calcium: 7.8 mg/dL — ABNORMAL LOW (ref 8.9–10.3)
Chloride: 100 mmol/L (ref 98–111)
Creatinine, Ser: 0.99 mg/dL (ref 0.44–1.00)
GFR calc Af Amer: >60 mL/min (ref >60)
GFR calc non Af Amer: >60 mL/min (ref >60)
Glucose, Bld: 204 mg/dL — ABNORMAL HIGH (ref 70–99)
Potassium: 4.2 mmol/L (ref 3.5–5.1)
Sodium: 141 mmol/L (ref 135–145)
Total Bilirubin: 0.4 mg/dL (ref 0.3–1.2)
Total Protein: 6.9 g/dL (ref 6.5–8.1)

## 2019-02-07 LAB — FERRITIN: Ferritin: 150 ng/mL (ref 11–307)

## 2019-02-07 LAB — C-REACTIVE PROTEIN: CRP: 1.6 mg/dL — ABNORMAL HIGH (ref <1.0)

## 2019-02-07 LAB — MAGNESIUM: Magnesium: 2.1 mg/dL (ref 1.7–2.4)

## 2019-02-07 LAB — PHOSPHORUS: Phosphorus: 2.4 mg/dL — ABNORMAL LOW (ref 2.5–4.6)

## 2019-02-07 NOTE — Progress Notes (Addendum)
SATURATION QUALIFICATIONS: (This note is used to comply with regulatory documentation for home oxygen)  Patient Saturations on Room Air at Rest = 80%  Patient Saturations on Room Air while Ambulating = N/A  Patient Saturations on 3 Liters of oxygen while Ambulating = greater than 88%, maintained 91-97%  Please briefly explain why patient needs home oxygen: Patient is on 2Lpm [place] at baseline and she will maintain greater than 88% at this level. While ambulating the patient needed O2 at 3Lpm [place] to maintain a sat of greater than 88%. On 3Lpm [place] the O2 sat maintained between 91-97% while ambulating.

## 2019-02-07 NOTE — TOC Transition Note (Addendum)
Transition of Care [facility]) - CM/SW Discharge Note Donn Pierini RN, BSN Transitions of Care Unit 4E- RN Case Manager [place] Weekend Coverage) [phone]   Patient Details  Name: KHRISTA BRAUN MRN: [id number] Date of Birth: 1970/03/03  Transition of Care [facility]) CM/SW Contact:  Darrold Span, RN Phone Number: 02/07/2019, 2:47 PM   Clinical Narrative:    Pt stable for transition home today, order placed for home 02 3L- TC made to pt on cell phone to bedside per pt she is already set up with home 02 at home and son will bring portable tank to transport home from [facility]. Pt is not sure what company she gets her home 02 through and is going to call her family to check sticker on tank. Return call to pt to verify 02 company made a few minutes later with pt stating that company is [employer]. - search for phone # found [phone]- call made however they are closed on the weekend- CM will f/u in am to speak to someone about updating pt's home 02 needs- orders faxed in epic to [phone] for updated home 02 needs. Pt is fine to return home today as she already has home equipment in the home for home 02 needs.    Final next level of care: Home/Self Care Barriers to Discharge: No Barriers Identified   Patient Goals and CMS Choice Patient states their goals for this hospitalization and ongoing recovery are:: return home and get stronger CMS Medicare.gov Compare Post Acute Care list provided to:: Patient Choice offered to / list presented to : Patient  Discharge Placement               Home         Discharge Plan and Services   Discharge Planning Services: CM Consult Post Acute Care Choice: Durable Medical Equipment          DME Arranged: Oxygen DME Agency: Other - Comment([facility]) Date DME Agency Contacted: 02/07/19 Time DME Agency Contacted: [phone] Representative spoke with at DME Agency: (office closed on weekends will contact on 1/4) HH  Arranged: NA HH Agency: NA        Social Determinants of Health (SDOH) Interventions     Readmission Risk Interventions Readmission Risk Prevention Plan 02/07/2019  Transportation Screening Complete  PCP or Specialist Appt within 5-7 Days Complete  Home Care Screening Complete  Medication Review (RN CM) Complete  Some recent data might be hidden

## 2019-02-07 NOTE — Discharge Summary (Signed)
Physician Discharge Summary  Sara Zhang OVF:[id number] DOB: 11/22/1970 DOA: 02/03/2019  PCP: Venita Lick, NP  Admit date: 02/03/2019 Discharge date: 02/07/2019  Time spent: 40 minutes  Recommendations for Outpatient Follow-up:  1. Follow outpatient CBC/CMP 2. Follow quarantine per CDC guidelines (21 days from start of symptoms, December 24 -> Jan 14 if 24 hrs without fevers without meds) 3. Follow oxygen requirement outpatient  4. 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 stage 2 chronic kidney disease (HCC)   BMI 50.0-59.9, adult (Sara Zhang)   Chronic heart failure with preserved ejection fraction Sara Zhang)   Discharge Condition: stable  Diet recommendation: diabetic  Filed Weights   02/03/19 0655  Weight: (!) 169 kg    History of present illness:  [name] a48 y.o.female,with IDDM, CKD, HTN, HLD, chronic hypoxic respiratory failure on 2 L Sara Zhang O2,HFpEFwho [facility] to [facility] with progressive shortness of breath, cough, found to be in acute on chronic respiratory failure with hypoxia secondary to SARS-CoV-2 pneumonia. She reports she started feeling unwell about Sara Zhang week ago, initially she had subjective fevers with chills accompanied by intermittent cough. However, over the last couple days she has had progressive shortness of breath prompting the ER presentation. Cough is intermittently productive of yellow sputum. She denies any hemoptysis. She denies any chest pain, palpitations, orthopnea, PND. No sick contacts. No recent travel.  She was admitted for COVID 19 pneumonia.  She received steroids and remdesivir.  She gradually improved  and was back on her home oxygen regimen at rest, but needed 3 L with activity.  Discharged with return precautions.  Zhang Course:  Acute hypoxemic respiratory failure due to SARS-CoV-2 disease: On 2 L, with home O2 screen, she desatted to 86% on 2 L, but did well with activity on 3 L - will discharge with 2-3 L O2 - follow long term need outpatient S/p steroids, remdesivir (day 5) Daily inflammatory labs I/O, daily weights  T2DM  HLD: Resume home regimen  Hypertension: continue amlodipine, atenolol, lasix   CKD Stage AC:ZYSAYT with lasix, lisinopril  Obstructive sleep apnea:resume home CPAP  Morbid obesity/BMI 50.0-59.9, adult [name]): follow outpatient  Chronic heart failure with preserved ejection fraction:Continue home lasix, lisinopril.   Chronic Hypoxic Respiratory Failure: uses 2 L at baseline.   Procedures:  none  Consultations:  none  Discharge Exam: Vitals:   02/07/19 0339 02/07/19 0819  BP: 127/80 120/90  Pulse: 89 75  Resp: 19 19  Temp: 97.8 F (36.6 C) 97.9 F (36.6 C)  SpO2: 90% 90%   Feeling better Ready to go home  General: No acute distress. Cardiovascular: Heart sounds show Sara Zhang regular rate, and rhythm.  Lungs: Clear to auscultation bilaterally  Abdomen: Soft, nontender, nondistended Neurological: Alert and oriented 3. Moves all extremities 4 . Cranial nerves II through XII grossly intact. Skin: Warm and dry. No rashes or lesions. Extremities: No clubbing or cyanosis. No edema.  Discharge Instructions   Discharge Instructions    Call MD for:  difficulty breathing, headache or visual disturbances   Complete by: As directed    Call MD for:  extreme fatigue   Complete by: As directed    Call MD for:  hives  Complete by: As directed    Call MD for:  persistant dizziness or light-headedness   Complete by: As directed    Call MD for:  persistant nausea and vomiting   Complete by: As directed    Call MD for:  redness,  tenderness, or signs of infection (pain, swelling, redness, odor or green/yellow discharge around incision site)   Complete by: As directed    Call MD for:  severe uncontrolled pain   Complete by: As directed    Call MD for:  temperature >100.4   Complete by: As directed    Diet - low sodium heart healthy   Complete by: As directed    Discharge instructions   Complete by: As directed    You were seen for COVID 19 pneumonia.  You've improved with steroids and remdesivir.  You will need to continue to quarantine until January 14th (21 days since your symptoms started).  If you have no symptoms for 24 hours without using medications, you can discontinue your quarantine after that date.  Please call your PCP or the health department for questions.  Continue to use your 2 L at baseline.  Use 3 L with activity and while sleeping.  Please follow up with your PCP regarding long term oxygen use.   Follow your blood sugars closely as you come off steroids.    Follow up with your PCP for Sara Zhang chest x ray outpatient.  Return for new, recurrent, or worsening symptoms.  Please ask your PCP to request records from this hospitalization so they know what was done and what the next steps will be.   Increase activity slowly   Complete by: As directed    MyChart COVID-19 home monitoring program   Complete by: Feb 07, 2019    Is the patient willing to use the Sara Zhang for home monitoring?: Yes   Temperature monitoring   Complete by: Feb 07, 2019    After how many days would you like to receive Sara Zhang notification of this patient's flowsheet entries?: 1     Allergies as of 02/07/2019   No Known Allergies     Medication List    TAKE these medications   acetaminophen 500 MG tablet Commonly known as: TYLENOL Take 1,000 mg by mouth every 6 (six) hours as needed (for pain.).   amLODipine 5 MG tablet Commonly known as: NORVASC Take 1 tablet (5 mg total) by mouth daily.   atenolol 50 MG  tablet Commonly known as: TENORMIN TAKE 1 TABLET BY MOUTH ONCE DAILY   atorvastatin 10 MG tablet Commonly known as: LIPITOR Take 1 tablet (10 mg total) by mouth daily.   blood glucose meter kit and supplies Kit Dispense based on patient and insurance preference. Use up to four times daily as directed. (FOR ICD-9 250.00, 250.01).   Bydureon BCise 2 MG/0.85ML Auij Generic drug: Exenatide ER INJECT 2 MG UNDER THE SKIN EVERY WEEK What changed: See the new instructions.   furosemide 40 MG tablet Commonly known as: LASIX Take 1 tablet (40 mg total) by mouth 2 (two) times daily.   glucose blood test strip Commonly known as: Contour Next Test TEST BLOOD SUGAR ONCE DAILY   Insulin Glargine 300 UNIT/ML Sopn Commonly known as: Toujeo SoloStar Inject 54 Units into the skin at bedtime.   Insulin Pen Needle 32G X 4 MM Misc 1 Units by Does not apply route every morning. Pen needles   Jardiance 25 MG Tabs tablet Generic drug: empagliflozin Take  25 mg by mouth daily.   levonorgestrel 20 MCG/24HR IUD Commonly known as: MIRENA 1 each by Intrauterine route once.   lisinopril 40 MG tablet Commonly known as: ZESTRIL TAKE 1 TABLET BY MOUTH EVERY DAY   OXYGEN Inhale 2 L into the lungs daily.   Oyster Shell Calcium 500 + D 500-125 MG-UNIT Tabs Generic drug: Calcium Carbonate-Vitamin D Take 1 tablet by mouth daily.   Vitamin D3 10 MCG (400 UNIT) tablet Take 400 Units by mouth daily.            Durable Medical Equipment  (From admission, onward)         Start     Ordered   02/07/19 1422  DME Oxygen  Once    Comments: SATURATION QUALIFICATIONS: (This note is used to comply with regulatory documentation for home oxygen)  Patient Saturations on Room Air at Rest = 80%.  On 2 L, patient is satting 90%.   Patient Saturations on Room Air while Ambulating = N/Lelaina [place] (patient on 2 L at baseline)  Patient Saturations on 3 Liters of oxygen while Ambulating = greater than 88%,  maintained 91-97%  Please briefly explain why patient needs home oxygen: Patient is on 2Lpm [place] at baseline and she will maintain greater than 88% at this level. While ambulating the patient needed O2 at 3Lpm [place] to maintain Alysah Carton sat of greater than 88%. On 3Lpm [place] the O2 sat maintained between 91-97% while ambulating.  Question Answer Comment  Length of Need 12 Months   Mode or (Route) Nasal cannula   Liters per Minute 3   Frequency Continuous (stationary and portable oxygen unit needed)   Oxygen conserving device Yes   Oxygen delivery system Gas      02/07/19 1422         No Known Allergies    The results of significant diagnostics from this hospitalization (including imaging, microbiology, ancillary and laboratory) are listed below for reference.    Significant Diagnostic Studies: DG Chest Portable 1 View  Result Date: 02/02/2019 CLINICAL DATA:  Cough and fever. EXAM: PORTABLE CHEST 1 VIEW COMPARISON:  05/23/2006 FINDINGS: In the cardiac silhouette is significantly enlarged. There are pulmonary opacities bilaterally. There may be small bilateral pleural effusions. There is no pneumothorax. There is no acute osseous abnormality. IMPRESSION: Diffuse bilateral pulmonary opacities concerning for an atypical infectious process such as viral pneumonia or developing pulmonary edema. Electronically Signed   By: Constance Holster M.D.   On: 02/02/2019 21:50   MM 3D SCREEN BREAST BILATERAL  Result Date: 01/15/2019 CLINICAL DATA:  Screening. EXAM: DIGITAL SCREENING BILATERAL MAMMOGRAM WITH TOMO AND CAD COMPARISON:  Previous exam(s). ACR Breast Density Category b: There are scattered areas of fibroglandular density. FINDINGS: There are no findings suspicious for malignancy. Images were processed with CAD. IMPRESSION: No mammographic evidence of malignancy. [facility] result letter of this screening mammogram will be mailed directly to the patient. RECOMMENDATION: Screening mammogram in one year.  (Code:SM-B-01Y) BI-RADS CATEGORY  1: Negative. Electronically Signed   By: Marin Olp M.D.   On: 01/15/2019 13:15    Microbiology: No results found for this or any previous visit (from the past 240 hour(s)).   Labs: Basic Metabolic Panel: Recent Labs  Lab 02/03/19 0643 02/03/19 2210 02/04/19 0345 02/05/19 0632 02/06/19 0557 02/07/19 0026  NA 141  --  143 145 139 141  K 4.1  --  4.0 4.1 4.4 4.2  CL 103  --  103 105 99 100  CO2 26  --  _0 32  GLUCOSE 184* 387* 224* 131* 240* 204*  BUN 22*  --  35* 34* 32* 36*  CREATININE 1.26*  --  1.23* 1.03* 0.99 0.99  CALCIUM 7.4*  --  7.8* 7.9* 7.7* 7.8*  MG  --   --  2.4 2.3 2.3 2.1  PHOS  --   --  3.6 3.6 3.3 2.4*   Liver Function Tests: Recent Labs  Lab 02/03/19 2890 02/04/19 0345 02/05/19 0632 02/06/19 0557 02/07/19 0026  AST _1 ALT _2 ALKPHOS 68 61 60 61 65  BILITOT 0.5 0.4 0.2* 0.7 0.4  PROT 7.4 7.8 7.2 6.9 6.9  ALBUMIN 2.8* 2.9* 2.7* 2.8* 2.9*   No results for input(s): LIPASE, AMYLASE in the last 168 hours. No results for input(s): AMMONIA in the last 168 hours. CBC: Recent Labs  Lab 02/03/19 0643 02/04/19 0345 02/05/19 0632 02/06/19 0557 02/07/19 0026  WBC 6.2 7.2 10.5 10.4 12.8*  NEUTROABS 5.2 5.8 9.1* 8.7* 9.3*  HGB 11.2* 11.4* 10.8* 10.5* 11.1*  HCT 38.7 39.4 37.7 36.7 38.4  MCV 88.0 87.0 87.5 86.4 86.3  PLT 288 305 346 348 378   Cardiac Enzymes: No results for input(s): CKTOTAL, CKMB, CKMBINDEX, TROPONINI in the last 168 hours. BNP: BNP (last 3 results) Recent Labs    02/03/19 0643  BNP 27.9    ProBNP (last 3 results) No results for input(s): PROBNP in the last 8760 hours.  CBG: Recent Labs  Lab 02/06/19 1134 02/06/19 1539 02/06/19 2043 02/07/19 0720 02/07/19 1135  GLUCAP 369* 326* 231* 110* 194*       Signed:  Fayrene Helper MD.  [facility] 02/07/2019, 2:22 PM

## 2019-02-07 NOTE — Discharge Instructions (Signed)
Continue to quarantine until January 14th.  You can discontinue your quarantine after this date if you have no symptoms for 24 hours without using medications.     Person Under Monitoring Name: Sara Zhang  Location: [address] [place] [address]   Infection Prevention Recommendations for Individuals Confirmed to have, or Being Evaluated for, 2019 Novel Coronavirus (COVID-19) Infection Who Receive Care at Home  Individuals who are confirmed to have, or are being evaluated for, COVID-19 should follow the prevention steps below until Sara Zhang healthcare provider or local or state health department says they can return to normal activities.  Stay home except to get medical care You should restrict activities outside your home, except for getting medical care. Do not go to work, school, or public areas, and do not use public transportation or taxis.  Call ahead before visiting your doctor Before your medical appointment, call the healthcare provider and tell them that you have, or are being evaluated for, COVID-19 infection. This will help the healthcare provider's office take steps to keep other people from getting infected. Ask your healthcare provider to call the local or state health department.  Monitor your symptoms Seek prompt medical attention if your illness is worsening (e.g., difficulty breathing). Before going to your medical appointment, call the healthcare provider and tell them that you have, or are being evaluated for, COVID-19 infection. Ask your healthcare provider to call the local or state health department.  Wear Sara Zhang facemask You should wear Sara Zhang facemask that covers your nose and mouth when you are in the same room with other people and when you visit Sara Zhang healthcare provider. People who live with or visit you should also wear Sara Zhang facemask while they are in the same room with you.  Separate yourself from other people in your home As much as possible, you should  stay in Sara Zhang different room from other people in your home. Also, you should use Sara Zhang separate bathroom, if available.  Avoid sharing household items You should not share dishes, drinking glasses, cups, eating utensils, towels, bedding, or other items with other people in your home. After using these items, you should wash them thoroughly with soap and water.  Cover your coughs and sneezes Cover your mouth and nose with Sara Zhang tissue when you cough or sneeze, or you can cough or sneeze into your sleeve. Throw used tissues in Sara Zhang lined trash can, and immediately wash your hands with soap and water for at least 20 seconds or use an alcohol-based hand rub.  Wash your Sara Zhang your hands often and thoroughly with soap and water for at least 20 seconds. You can use an alcohol-based hand sanitizer if soap and water are not available and if your hands are not visibly dirty. Avoid touching your eyes, nose, and mouth with unwashed hands.   Prevention Steps for Caregivers and Household Members of Individuals Confirmed to have, or Being Evaluated for, COVID-19 Infection Being Cared for in the Home  If you live with, or provide care at home for, Sara Zhang person confirmed to have, or being evaluated for, COVID-19 infection please follow these guidelines to prevent infection:  Follow healthcare provider's instructions Make sure that you understand and can help the patient follow any healthcare provider instructions for all care.  Provide for the patient's basic needs You should help the patient with basic needs in the home and provide support for getting groceries, prescriptions, and other personal needs.  Monitor the patient's symptoms If they are getting  sicker, call his or her medical provider and tell them that the patient has, or is being evaluated for, COVID-19 infection. This will help the healthcare provider's office take steps to keep other people from getting infected. Ask the healthcare provider to call  the local or state health department.  Limit the number of people who have contact with the patient  If possible, have only one caregiver for the patient.  Other household members should stay in another home or place of residence. If this is not possible, they should stay  in another room, or be separated from the patient as much as possible. Use Sara Zhang separate bathroom, if available.  Restrict visitors who do not have an essential need to be in the home.  Keep older adults, very young children, and other sick people away from the patient Keep older adults, very young children, and those who have compromised immune systems or chronic health conditions away from the patient. This includes people with chronic heart, lung, or kidney conditions, diabetes, and cancer.  Ensure good ventilation Make sure that shared spaces in the home have good air flow, such as from an air conditioner or an opened window, weather permitting.  Wash your hands often  Wash your hands often and thoroughly with soap and water for at least 20 seconds. You can use an alcohol based hand sanitizer if soap and water are not available and if your hands are not visibly dirty.  Avoid touching your eyes, nose, and mouth with unwashed hands.  Use disposable paper towels to dry your hands. If not available, use dedicated cloth towels and replace them when they become wet.  Wear Sara Zhang facemask and gloves  Wear Sara Zhang disposable facemask at all times in the room and gloves when you touch or have contact with the patient's blood, body fluids, and/or secretions or excretions, such as sweat, saliva, sputum, nasal mucus, vomit, urine, or feces.  Ensure the mask fits over your nose and mouth tightly, and do not touch it during use.  Throw out disposable facemasks and gloves after using them. Do not reuse.  Wash your hands immediately after removing your facemask and gloves.  If your personal clothing becomes contaminated, carefully remove  clothing and launder. Wash your hands after handling contaminated clothing.  Place all used disposable facemasks, gloves, and other waste in Kristel Durkee lined container before disposing them with other household waste.  Remove gloves and wash your hands immediately after handling these items.  Do not share dishes, glasses, or other household items with the patient  Avoid sharing household items. You should not share dishes, drinking glasses, cups, eating utensils, towels, bedding, or other items with Hernando Reali patient who is confirmed to have, or being evaluated for, COVID-19 infection.  After the person uses these items, you should wash them thoroughly with soap and water.  Wash laundry thoroughly  Immediately remove and wash clothes or bedding that have blood, body fluids, and/or secretions or excretions, such as sweat, saliva, sputum, nasal mucus, vomit, urine, or feces, on them.  Wear gloves when handling laundry from the patient.  Read and follow directions on labels of laundry or clothing items and detergent. In general, wash and dry with the warmest temperatures recommended on the label.  Clean all areas the individual has used often  Clean all touchable surfaces, such as counters, tabletops, doorknobs, bathroom fixtures, toilets, phones, keyboards, tablets, and bedside tables, every day. Also, clean any surfaces that may have blood, body fluids, and/or secretions or  excretions on them.  Wear gloves when cleaning surfaces the patient has come in contact with.  Use Tulani Kidney diluted bleach solution (e.g., dilute bleach with 1 part bleach and 10 parts water) or Aliah Eriksson household disinfectant with Akari Defelice label that says EPA-registered for coronaviruses. To make Azizah Lisle bleach solution at home, add 1 tablespoon of bleach to 1 quart (4 cups) of water. For Alexis Mizuno larger supply, add  cup of bleach to 1 gallon (16 cups) of water.  Read labels of cleaning products and follow recommendations provided on product labels. Labels contain  instructions for safe and effective use of the cleaning product including precautions you should take when applying the product, such as wearing gloves or eye protection and making sure you have good ventilation during use of the product.  Remove gloves and wash hands immediately after cleaning.  Monitor yourself for signs and symptoms of illness Caregivers and household members are considered close contacts, should monitor their health, and will be asked to limit movement outside of the home to the extent possible. Follow the monitoring steps for close contacts listed on the symptom monitoring form.   ? If you have additional questions, contact your local health department or call the epidemiologist on call at [phone] (available 24/7). ? This guidance is subject to change. For the most up-to-date guidance from [facility], please refer to their website: [email or web address]

## 2019-02-08 ENCOUNTER — Telehealth: Payer: Self-pay

## 2019-02-08 LAB — GLUCOSE, CAPILLARY: Glucose-Capillary: 184 mg/dL — ABNORMAL HIGH (ref 70–99)

## 2019-02-08 NOTE — Telephone Encounter (Signed)
Transition Care Management Follow-up Telephone Call  Date of discharge and from where: 02/07/2019 from [facility]   How have you been since you were released from the hospital? ""im doing okay today"  Any questions or concerns? No   Items Reviewed:  Did the pt receive and understand the discharge instructions provided? Yes   Medications obtained and verified? Yes   Any new allergies since your discharge? No   Dietary orders reviewed? Yes  Do you have support at home? Yes   Functional Questionnaire: (I = Independent and D = Dependent) ADLs: I  Bathing/Dressing- i  Meal Prep- i  Eating- i  Maintaining continence- i  Transferring/Ambulation- i  Managing Meds- i  Follow up appointments reviewed:   PCP Hospital f/u appt confirmed? Yes  Scheduled to see Sara Dials, NP on 02/10/2019 @ 11 via virtual visit .  Specialist Hospital f/u appt confirmed? No    Are transportation arrangements needed? No   If their condition worsens, is the pt aware to call PCP or go to the Emergency Dept.? Yes  Was the patient provided with contact information for the PCP's office or ED? Yes  Was to pt encouraged to call back with questions or concerns? Yes

## 2019-02-09 ENCOUNTER — Telehealth: Payer: Self-pay

## 2019-02-09 NOTE — Progress Notes (Signed)
Post discharge call made to [facility]- spoke with [facility] regarding fax that was sent over weekend for new home 02 liter flow-  they do not have  new orders in the system- Renee request fax to be sent to different fax that comes directly to her so that she can be sure orders get updated in the system- orders re-faxed to [phone] via epic.

## 2019-02-10 ENCOUNTER — Other Ambulatory Visit: Payer: Self-pay

## 2019-02-10 ENCOUNTER — Ambulatory Visit (INDEPENDENT_AMBULATORY_CARE_PROVIDER_SITE_OTHER): Payer: Medicare Other | Admitting: Nurse Practitioner

## 2019-02-10 ENCOUNTER — Encounter: Payer: Self-pay | Admitting: Nurse Practitioner

## 2019-02-10 DIAGNOSIS — Z794 Long term (current) use of insulin: Secondary | ICD-10-CM | POA: Diagnosis not present

## 2019-02-10 DIAGNOSIS — E119 Type 2 diabetes mellitus without complications: Secondary | ICD-10-CM | POA: Diagnosis not present

## 2019-02-10 DIAGNOSIS — I130 Hypertensive heart and chronic kidney disease with heart failure and stage 1 through stage 4 chronic kidney disease, or unspecified chronic kidney disease: Secondary | ICD-10-CM | POA: Diagnosis not present

## 2019-02-10 DIAGNOSIS — E1169 Type 2 diabetes mellitus with other specified complication: Secondary | ICD-10-CM | POA: Diagnosis not present

## 2019-02-10 DIAGNOSIS — N182 Chronic kidney disease, stage 2 (mild): Secondary | ICD-10-CM | POA: Diagnosis not present

## 2019-02-10 DIAGNOSIS — U071 COVID-19: Secondary | ICD-10-CM | POA: Diagnosis not present

## 2019-02-10 DIAGNOSIS — J9601 Acute respiratory failure with hypoxia: Secondary | ICD-10-CM | POA: Diagnosis not present

## 2019-02-10 DIAGNOSIS — E785 Hyperlipidemia, unspecified: Secondary | ICD-10-CM

## 2019-02-10 NOTE — Assessment & Plan Note (Signed)
Acute with improvement.  Continue to monitor O2 sat at home and continue quarantine until the 14th.  IF worsening SOB or difficulty breathing is aware to immediately go to ER.  At this time will plan to follow-up on 27th and obtain labs + repeat CXR upcoming.

## 2019-02-10 NOTE — Patient Instructions (Signed)
COVID-19 COVID-19 is a respiratory infection that is caused by a virus called severe acute respiratory syndrome coronavirus 2 (SARS-CoV-2). The disease is also known as coronavirus disease or novel coronavirus. In some people, the virus may not cause any symptoms. In others, it may cause a serious infection. The infection can get worse quickly and can lead to complications, such as:  Pneumonia, or infection of the lungs.  Acute respiratory distress syndrome or ARDS. This is a condition in which fluid build-up in the lungs prevents the lungs from filling with air and passing oxygen into the blood.  Acute respiratory failure. This is a condition in which there is not enough oxygen passing from the lungs to the body or when carbon dioxide is not passing from the lungs out of the body.  Sepsis or septic shock. This is a serious bodily reaction to an infection.  Blood clotting problems.  Secondary infections due to bacteria or fungus.  Organ failure. This is when your body's organs stop working. The virus that causes COVID-19 is contagious. This means that it can spread from person to person through droplets from coughs and sneezes (respiratory secretions). What are the causes? This illness is caused by a virus. You may catch the virus by:  Breathing in droplets from an infected person. Droplets can be spread by a person breathing, speaking, singing, coughing, or sneezing.  Touching something, like a table or a doorknob, that was exposed to the virus (contaminated) and then touching your mouth, nose, or eyes. What increases the risk? Risk for infection You are more likely to be infected with this virus if you:  Are within 6 feet (2 meters) of a person with COVID-19.  Provide care for or live with a person who is infected with COVID-19.  Spend time in crowded indoor spaces or live in shared housing. Risk for serious illness You are more likely to become seriously ill from the virus if you:   Are 50 years of age or older. The higher your age, the more you are at risk for serious illness.  Live in a nursing home or long-term care facility.  Have cancer.  Have a long-term (chronic) disease such as: ? Chronic lung disease, including chronic obstructive pulmonary disease or asthma. ? A long-term disease that lowers your body's ability to fight infection (immunocompromised). ? Heart disease, including heart failure, a condition in which the arteries that lead to the heart become narrow or blocked (coronary artery disease), a disease which makes the heart muscle thick, weak, or stiff (cardiomyopathy). ? Diabetes. ? Chronic kidney disease. ? Sickle cell disease, a condition in which red blood cells have an abnormal "sickle" shape. ? Liver disease.  Are obese. What are the signs or symptoms? Symptoms of this condition can range from mild to severe. Symptoms may appear any time from 2 to 14 days after being exposed to the virus. They include:  A fever or chills.  A cough.  Difficulty breathing.  Headaches, body aches, or muscle aches.  Runny or stuffy (congested) nose.  A sore throat.  New loss of taste or smell. Some people may also have stomach problems, such as nausea, vomiting, or diarrhea. Other people may not have any symptoms of COVID-19. How is this diagnosed? This condition may be diagnosed based on:  Your signs and symptoms, especially if: ? You live in an area with a COVID-19 outbreak. ? You recently traveled to or from an area where the virus is common. ? You   provide care for or live with a person who was diagnosed with COVID-19. ? You were exposed to a person who was diagnosed with COVID-19.  A physical exam.  Lab tests, which may include: ? Taking a sample of fluid from the back of your nose and throat (nasopharyngeal fluid), your nose, or your throat using a swab. ? A sample of mucus from your lungs (sputum). ? Blood tests.  Imaging tests, which  may include, X-rays, CT scan, or ultrasound. How is this treated? At present, there is no medicine to treat COVID-19. Medicines that treat other diseases are being used on a trial basis to see if they are effective against COVID-19. Your health care provider will talk with you about ways to treat your symptoms. For most people, the infection is mild and can be managed at home with rest, fluids, and over-the-counter medicines. Treatment for a serious infection usually takes places in a hospital intensive care unit (ICU). It may include one or more of the following treatments. These treatments are given until your symptoms improve.  Receiving fluids and medicines through an IV.  Supplemental oxygen. Extra oxygen is given through a tube in the nose, a face mask, or a hood.  Positioning you to lie on your stomach (prone position). This makes it easier for oxygen to get into the lungs.  Continuous positive airway pressure (CPAP) or bi-level positive airway pressure (BPAP) machine. This treatment uses mild air pressure to keep the airways open. A tube that is connected to a motor delivers oxygen to the body.  Ventilator. This treatment moves air into and out of the lungs by using a tube that is placed in your windpipe.  Tracheostomy. This is a procedure to create a hole in the neck so that a breathing tube can be inserted.  Extracorporeal membrane oxygenation (ECMO). This procedure gives the lungs a chance to recover by taking over the functions of the heart and lungs. It supplies oxygen to the body and removes carbon dioxide. Follow these instructions at home: Lifestyle  If you are sick, stay home except to get medical care. Your health care provider will tell you how long to stay home. Call your health care provider before you go for medical care.  Rest at home as told by your health care provider.  Do not use any products that contain nicotine or tobacco, such as cigarettes, e-cigarettes, and  chewing tobacco. If you need help quitting, ask your health care provider.  Return to your normal activities as told by your health care provider. Ask your health care provider what activities are safe for you. General instructions  Take over-the-counter and prescription medicines only as told by your health care provider.  Drink enough fluid to keep your urine pale yellow.  Keep all follow-up visits as told by your health care provider. This is important. How is this prevented?  There is no vaccine to help prevent COVID-19 infection. However, there are steps you can take to protect yourself and others from this virus. To protect yourself:   Do not travel to areas where COVID-19 is a risk. The areas where COVID-19 is reported change often. To identify high-risk areas and travel restrictions, check the CDC travel website: wwwnc.cdc.gov/travel/notices  If you live in, or must travel to, an area where COVID-19 is a risk, take precautions to avoid infection. ? Stay away from people who are sick. ? Wash your hands often with soap and water for 20 seconds. If soap and water   are not available, use an alcohol-based hand sanitizer. ? Avoid touching your mouth, face, eyes, or nose. ? Avoid going out in public, follow guidance from your state and local health authorities. ? If you must go out in public, wear a cloth face covering or face mask. Make sure your mask covers your nose and mouth. ? Avoid crowded indoor spaces. Stay at least 6 feet (2 meters) away from others. ? Disinfect objects and surfaces that are frequently touched every day. This may include:  Counters and tables.  Doorknobs and light switches.  Sinks and faucets.  Electronics, such as phones, remote controls, keyboards, computers, and tablets. To protect others: If you have symptoms of COVID-19, take steps to prevent the virus from spreading to others.  If you think you have a COVID-19 infection, contact your health care  provider right away. Tell your health care team that you think you may have a COVID-19 infection.  Stay home. Leave your house only to seek medical care. Do not use public transport.  Do not travel while you are sick.  Wash your hands often with soap and water for 20 seconds. If soap and water are not available, use alcohol-based hand sanitizer.  Stay away from other members of your household. Let healthy household members care for children and pets, if possible. If you have to care for children or pets, wash your hands often and wear a mask. If possible, stay in your own room, separate from others. Use a different bathroom.  Make sure that all people in your household wash their hands well and often.  Cough or sneeze into a tissue or your sleeve or elbow. Do not cough or sneeze into your hand or into the air.  Wear a cloth face covering or face mask. Make sure your mask covers your nose and mouth. Where to find more information  Centers for Disease Control and Prevention: www.cdc.gov/coronavirus/2019-ncov/index.html  World Health Organization: www.who.int/health-topics/coronavirus Contact a health care provider if:  You live in or have traveled to an area where COVID-19 is a risk and you have symptoms of the infection.  You have had contact with someone who has COVID-19 and you have symptoms of the infection. Get help right away if:  You have trouble breathing.  You have pain or pressure in your chest.  You have confusion.  You have bluish lips and fingernails.  You have difficulty waking from sleep.  You have symptoms that get worse. These symptoms may represent a serious problem that is an emergency. Do not wait to see if the symptoms will go away. Get medical help right away. Call your local emergency services (911 in the [place] Do not drive yourself to the hospital. Let the emergency medical personnel know if you think you have COVID-19. Summary  COVID-19 is a  respiratory infection that is caused by a virus. It is also known as coronavirus disease or novel coronavirus. It can cause serious infections, such as pneumonia, acute respiratory distress syndrome, acute respiratory failure, or sepsis.  The virus that causes COVID-19 is contagious. This means that it can spread from person to person through droplets from breathing, speaking, singing, coughing, or sneezing.  You are more likely to develop a serious illness if you are 50 years of age or older, have a weak immune system, live in a nursing home, or have chronic disease.  There is no medicine to treat COVID-19. Your health care provider will talk with you about ways to treat your symptoms.    Take steps to protect yourself and others from infection. Wash your hands often and disinfect objects and surfaces that are frequently touched every day. Stay away from people who are sick and wear a mask if you are sick. This information is not intended to replace advice given to you by your health care provider. Make sure you discuss any questions you have with your health care provider. Document Revised: 11/20/2018 Document Reviewed: 02/26/2018 [employer] Patient Education  2020 [employer]  

## 2019-02-10 NOTE — Assessment & Plan Note (Signed)
Chronic, ongoing with BP at goal recent visits.  Continue current medication regimen and collaboration with cardiology.  Recommend checking BP three mornings a week at home + documenting for providers.  Return as scheduled.

## 2019-02-10 NOTE — Assessment & Plan Note (Signed)
Chronic, ongoing with recent LDL 55 and TCHOL 113 at recent visit.  Continue current medication regimen as at goal.  Lipid panel next visit and CMP.

## 2019-02-10 NOTE — Progress Notes (Signed)
There were no vitals taken for this visit.   Subjective:    Patient ID: Sara Zhang, female    DOB: [birth year], 49 y.o.   MRN: [id number]  HPI: Sara Zhang is a 49 y.o. female  Chief Complaint  Patient presents with  . Hospitalization Follow-up    COIVD 19    . This visit was completed via FaceTime due to the restrictions of the COVID-19 pandemic. All issues as above were discussed and addressed. Physical exam was done as above through visual confirmation on FaceTime. If it was felt that the patient should be evaluated in the office, they were directed there. The patient verbally consented to this visit. . Location of the patient: home . Location of the provider: work . Those involved with this call:  . Provider: Marnee Guarneri, DNP . CMA: Yvonna Alanis, CMA . Front Desk/Registration: Don Perking  . Time spent on call: 30 minutes with patient face to face via video conference. More than 50% of this time was spent in counseling and coordination of care. 15 minutes total spent in review of patient's record and preparation of their chart.  . I verified patient identity using two factors (patient name and date of birth). Patient consents verbally to being seen via telemedicine visit today.    Recommendations for Outpatient Follow-up:  1. Follow outpatient CBC/CMP 2. Follow quarantine per CDC guidelines (21 days from start of symptoms, December 24 -> Jan 14 if 24 hrs without fevers without meds) 3. Follow oxygen requirement outpatient  4. Follow blood sugars  Transition of Care Hospital Follow up.   "History of present illness:  [name] a48 y.o.female,with IDDM, CKD, HTN, HLD, chronic hypoxic respiratory failure on 2 L [place] O2,HFpEFwho presented to [facility] with progressive shortness of breath, cough, found to be in acute on chronic respiratory failure with hypoxia secondary to SARS-CoV-2 pneumonia. She reports she started feeling unwell about a week ago,  initially she had subjective fevers with chills accompanied by intermittent cough. However, over the last couple days she has had progressive shortness of breath prompting the ER presentation. Cough is intermittently productive of yellow sputum. She denies any hemoptysis. She denies any chest pain, palpitations, orthopnea, PND. No sick contacts. No recent travel.  She was admitted for COVID 19 pneumonia.  She received steroids and remdesivir.  She gradually improved and was back on her home oxygen regimen at rest, but needed 3 L with activity.  Discharged with return precautions.  Hospital Course:  Acute hypoxemic respiratory failure due to SARS-CoV-2 disease: On 2 L, with home O2 screen, she desatted to 86% on 2 L, but did well with activity on 3 L - will discharge with 2-3 L O2 - follow long term need outpatient S/p steroids, remdesivir (day5) Daily inflammatory labs I/O, daily weights  T2DM  HLD: Resume home regimen  Hypertension: continue amlodipine, atenolol, lasix   CKD Stage IR:SWNIOE with lasix, lisinopril  Obstructive sleep apnea:resume home CPAP  Morbid obesity/BMI 50.0-59.9, adult [facility]): follow outpatient  Chronic heart failure with preserved ejection fraction:Continue home lasix, lisinopril.  Chronic Hypoxic Respiratory Failure: uses 2 L at baseline"  Hospital/Facility:  D/C Physician: Dr. 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 acetaminophen (TYLENOL) 500 MG tablet Take 1,000 mg by mouth every 6 (six) hours as needed (for pain.).  Marland Kitchen amLODipine (NORVASC) 5 MG tablet Take 1 tablet (5 mg total) by mouth daily.  Marland Kitchen atenolol (TENORMIN) 50 MG tablet TAKE 1 TABLET BY MOUTH ONCE DAILY (Patient taking differently: Take 50 mg by mouth daily. )  . atorvastatin (LIPITOR) 10 MG tablet Take 1 tablet (10 mg total) by mouth daily.  . blood glucose meter kit and supplies KIT Dispense based on patient and insurance preference. Use up to four times daily as directed. (FOR ICD-9 250.00, 250.01).  Marland Kitchen BYDUREON BCISE 2 MG/0.85ML AUIJ INJECT 2 MG UNDER THE SKIN EVERY WEEK (Patient taking differently: Inject 2 mg into the skin every Friday. )  . Calcium Carbonate-Vitamin D (OYSTER SHELL CALCIUM 500 + D) 500-125 MG-UNIT TABS Take 1 tablet by mouth daily.   . Cholecalciferol (VITAMIN D3) 10 MCG (400 UNIT) tablet Take 400 Units by mouth daily.  . furosemide (LASIX) 40 MG tablet Take 1 tablet (40 mg total) by mouth 2 (two) times daily.  Marland Kitchen glucose blood (CONTOUR NEXT TEST) test strip TEST BLOOD SUGAR ONCE DAILY  . Insulin Glargine (TOUJEO SOLOSTAR) 300 UNIT/ML SOPN Inject 54 Units into the skin at bedtime.  . Insulin Pen Needle 32G X 4 MM MISC 1 Units by Does not apply route every morning. Pen needles  . JARDIANCE 25 MG TABS tablet Take 25 mg by mouth daily.  Marland Kitchen levonorgestrel (MIRENA) 20 MCG/24HR IUD 1 each by Intrauterine route once.  Marland Kitchen lisinopril (ZESTRIL) 40 MG tablet TAKE 1 TABLET BY MOUTH EVERY DAY (Patient taking differently: Take 40 mg by mouth daily. )  . OXYGEN Inhale 2 L into the lungs daily.   No facility-administered encounter medications on file as of 02/10/2019.     Diagnostic Tests Reviewed/Disposition: Reviewed within Epic  Consults: Pulmonary  Discharge Instructions: Follow-up with PCP  Disease/illness Education: Educated patient  Home Health/Community Services Discussions/Referrals: reviewed with patient  Establishment or re-establishment of referral orders for community resources: none  Discussion with other health care providers: reviewed notes  Assessment and Support of treatment regimen adherence: reviewed with patient  Appointments Coordinated with: patient  Education for self-management, independent living, and ADLs: reviewed with patient  DIABETES Current medications include Glipizide 5 MG BID, Toujeo 56 units QHS, and Bydureon 2 MG weekly. A1C in October 6.8%.  Her previous A1C in January was trending downwards to 7.9%. She is followed by endocrinology and her last visit with them was 12/01/18.  Polydipsia/polyuria: no Visual disturbance: no Chest pain: no Paresthesias: no Glucose Monitoring: yes             Accucheck frequency: BID             Fasting glucose: 90 to 120 average             Evening: 130 - 200             Before meals: Taking Insulin?: yes  Long acting insulin: 56 units             Short acting insulin: Blood Pressure Monitoring: not checking Retinal Examination: Not up to Date Foot Exam: Up to Date Pneumovax: Up to Date Influenza: Up to Date Aspirin: no   HYPERTENSION / HYPERLIPIDEMIA/HF Current medications include Lisinopril, Furosemide, Atenolol for HTN + Lipitor for HLD. Followed by cardiology and last saw Dr. Saunders Revel 01/20/2019, was started on Amlodipine.  Is working on diet as has been eating a lot of pork and salty foods.   Satisfied with current treatment? yes Duration of hypertension: chronic BP monitoring frequency: not checking BP range:  BP medication side effects: no Duration of hyperlipidemia: chronic Cholesterol medication side effects: no Cholesterol supplements: none  Medication compliance: good compliance Aspirin: no Recent stressors: no Recurrent headaches: no Visual changes: no Palpitations: no Dyspnea: no Chest pain: no Lower extremity edema: no Dizzy/lightheaded: no  COVID PNA: At this time O2 sats 90 - 92% on 2L during day and 3L at night, for this time she has to use more O2 at night, prior only used 2L.  Does endorse loss of taste and smell. Her aunt had just passed away and had a large gathering at her home for this, prior to her diagnosis.  Feels Covid came from this event.  At this time she does endorse feeling better, but not 100%.  Is scheduled for 03/03/2019 to see PCP and is to remain on quarantine at home until 02/18/2019. Fever: no Cough: yes Shortness of breath: yes, "not much though" Wheezing: a little bit Chest pain: yes, with cough Chest tightness: no Chest congestion: no Nasal congestion: no Runny nose: yes Post nasal drip: no Sneezing: no Sore throat: no Swollen glands: no Sinus pressure: no Headache: no Face pain: no Toothache: no Ear pain: none Ear pressure: none Eyes red/itching:no Eye drainage/crusting: no  Vomiting: none, but does have diarrhea Rash: no Fatigue: yes Sick contacts: yes   Relevant past medical, surgical, family and social history reviewed and updated as indicated. Interim medical history since our last visit reviewed. Allergies and medications reviewed and updated.  Review of Systems  Constitutional: Negative for activity change, appetite change, diaphoresis, fatigue and fever.  Respiratory: Positive for cough (improving) and shortness of breath (improved). Negative for chest tightness and wheezing.   Cardiovascular: Negative for chest pain, palpitations and leg swelling.  Gastrointestinal: Positive for diarrhea (intermittent). Negative for abdominal distention, abdominal pain, constipation, nausea and vomiting.  Endocrine: Negative for cold intolerance, heat intolerance, polydipsia,  polyphagia and polyuria.  Neurological: Positive for headaches (intermittent). Negative for dizziness, syncope, weakness, light-headedness and numbness.  Psychiatric/Behavioral: Negative.     Per HPI unless specifically indicated above     Objective:    There were no vitals taken for this visit.  Wt Readings from Last 3 Encounters:  02/03/19 (!) 372 lb 9.2 oz (169 kg)  02/02/19 (!) 372 lb 9.2 oz (169 kg)  01/20/19 (!) 373 lb 12 oz (169.5 kg)    Physical Exam Vitals and nursing note reviewed.  Constitutional:      General: She is awake. She is not in acute distress.    Appearance: She is well-developed. She is not ill-appearing.  HENT:     Head: Normocephalic.     Right Ear: Hearing normal.     Left Ear: Hearing normal.  Eyes:     General: Lids are normal.        Right eye: No discharge.  Left eye: No discharge.     Conjunctiva/sclera: Conjunctivae normal.  Pulmonary:     Effort: Pulmonary effort is normal. No accessory muscle usage or respiratory distress.     Comments: Unable to auscultate due to virtual exam only.  Intermittent cough noted.  No SOB with talking. Musculoskeletal:     Cervical back: Normal range of motion.  Neurological:     Mental Status: She is alert and oriented to person, place, and time.  Psychiatric:        Attention and Perception: Attention normal.        Mood and Affect: Mood normal.        Behavior: Behavior normal. Behavior is cooperative.        Thought Content: Thought content normal.        Judgment: Judgment normal.    Results for orders placed or performed during the hospital encounter of 02/03/19  HIV Antibody (routine testing w rflx)  Result Value Ref Range   HIV Screen 4th Generation wRfx NON REACTIVE NON REACTIVE  Brain natriuretic peptide  Result Value Ref Range   B Natriuretic Peptide 27.9 0.0 - 100.0 pg/mL  C-reactive protein  Result Value Ref Range   CRP 10.9 (H) <1.0 mg/dL  Comprehensive metabolic panel  Result  Value Ref Range   Sodium 141 135 - 145 mmol/L   Potassium 4.1 3.5 - 5.1 mmol/L   Chloride 103 98 - 111 mmol/L   CO2 26 22 - 32 mmol/L   Glucose, Bld 184 (H) 70 - 99 mg/dL   BUN 22 (H) 6 - 20 mg/dL   Creatinine, Ser 1.26 (H) 0.44 - 1.00 mg/dL   Calcium 7.4 (L) 8.9 - 10.3 mg/dL   Total Protein 7.4 6.5 - 8.1 g/dL   Albumin 2.8 (L) 3.5 - 5.0 g/dL   AST 24 15 - 41 U/L   ALT 18 0 - 44 U/L   Alkaline Phosphatase 68 38 - 126 U/L   Total Bilirubin 0.5 0.3 - 1.2 mg/dL   GFR calc non Af Amer 50 (L) >60 mL/min   GFR calc Af Amer 58 (L) >60 mL/min   Anion gap 12 5 - 15  CBC with Differential/Platelet  Result Value Ref Range   WBC 6.2 4.0 - 10.5 K/uL   RBC 4.40 3.87 - 5.11 MIL/uL   Hemoglobin 11.2 (L) 12.0 - 15.0 g/dL   HCT 38.7 36.0 - 46.0 %   MCV 88.0 80.0 - 100.0 fL   MCH 25.5 (L) 26.0 - 34.0 pg   MCHC 28.9 (L) 30.0 - 36.0 g/dL   RDW 15.4 11.5 - 15.5 %   Platelets 288 150 - 400 K/uL   nRBC 0.0 0.0 - 0.2 %   Neutrophils Relative % 83 %   Neutro Abs 5.2 1.7 - 7.7 K/uL   Lymphocytes Relative 12 %   Lymphs Abs 0.7 0.7 - 4.0 K/uL   Monocytes Relative 4 %   Monocytes Absolute 0.2 0.1 - 1.0 K/uL   Eosinophils Relative 0 %   Eosinophils Absolute 0.0 0.0 - 0.5 K/uL   Basophils Relative 0 %   Basophils Absolute 0.0 0.0 - 0.1 K/uL   Immature Granulocytes 1 %   Abs Immature Granulocytes 0.03 0.00 - 0.07 K/uL  D-dimer, quantitative (not at [facility])  Result Value Ref Range   D-Dimer, Quant 1.08 (H) 0.00 - 0.50 ug/mL-FEU  Ferritin  Result Value Ref Range   Ferritin 202 11 - 307 ng/mL  Procalcitonin  Result Value Ref  Range   Procalcitonin <0.10 ng/mL  Hemoglobin A1c  Result Value Ref Range   Hgb A1c MFr Bld 7.1 (H) 4.8 - 5.6 %   Mean Plasma Glucose 157.07 mg/dL  Glucose, capillary  Result Value Ref Range   Glucose-Capillary 176 (H) 70 - 99 mg/dL  Glucose, capillary  Result Value Ref Range   Glucose-Capillary 311 (H) 70 - 99 mg/dL  CBC with Differential/Platelet  Result Value Ref Range    WBC 7.2 4.0 - 10.5 K/uL   RBC 4.53 3.87 - 5.11 MIL/uL   Hemoglobin 11.4 (L) 12.0 - 15.0 g/dL   HCT 39.4 36.0 - 46.0 %   MCV 87.0 80.0 - 100.0 fL   MCH 25.2 (L) 26.0 - 34.0 pg   MCHC 28.9 (L) 30.0 - 36.0 g/dL   RDW 15.4 11.5 - 15.5 %   Platelets 305 150 - 400 K/uL   nRBC 0.0 0.0 - 0.2 %   Neutrophils Relative % 80 %   Neutro Abs 5.8 1.7 - 7.7 K/uL   Lymphocytes Relative 12 %   Lymphs Abs 0.8 0.7 - 4.0 K/uL   Monocytes Relative 7 %   Monocytes Absolute 0.5 0.1 - 1.0 K/uL   Eosinophils Relative 0 %   Eosinophils Absolute 0.0 0.0 - 0.5 K/uL   Basophils Relative 0 %   Basophils Absolute 0.0 0.0 - 0.1 K/uL   Immature Granulocytes 1 %   Abs Immature Granulocytes 0.05 0.00 - 0.07 K/uL  Comprehensive metabolic panel  Result Value Ref Range   Sodium 143 135 - 145 mmol/L   Potassium 4.0 3.5 - 5.1 mmol/L   Chloride 103 98 - 111 mmol/L   CO2 28 22 - 32 mmol/L   Glucose, Bld 224 (H) 70 - 99 mg/dL   BUN 35 (H) 6 - 20 mg/dL   Creatinine, Ser 1.23 (H) 0.44 - 1.00 mg/dL   Calcium 7.8 (L) 8.9 - 10.3 mg/dL   Total Protein 7.8 6.5 - 8.1 g/dL   Albumin 2.9 (L) 3.5 - 5.0 g/dL   AST 20 15 - 41 U/L   ALT 19 0 - 44 U/L   Alkaline Phosphatase 61 38 - 126 U/L   Total Bilirubin 0.4 0.3 - 1.2 mg/dL   GFR calc non Af Amer 51 (L) >60 mL/min   GFR calc Af Amer 59 (L) >60 mL/min   Anion gap 12 5 - 15  C-reactive protein  Result Value Ref Range   CRP 9.5 (H) <1.0 mg/dL  D-dimer, quantitative (not at [facility])  Result Value Ref Range   D-Dimer, Quant 0.77 (H) 0.00 - 0.50 ug/mL-FEU  Ferritin  Result Value Ref Range   Ferritin 227 11 - 307 ng/mL  Magnesium  Result Value Ref Range   Magnesium 2.4 1.7 - 2.4 mg/dL  Phosphorus  Result Value Ref Range   Phosphorus 3.6 2.5 - 4.6 mg/dL  Glucose, capillary  Result Value Ref Range   Glucose-Capillary 313 (H) 70 - 99 mg/dL  Glucose, capillary  Result Value Ref Range   Glucose-Capillary 430 (H) 70 - 99 mg/dL  Glucose, random  Result Value Ref Range    Glucose, Bld 387 (H) 70 - 99 mg/dL  Glucose, capillary  Result Value Ref Range   Glucose-Capillary 309 (H) 70 - 99 mg/dL  Glucose, capillary  Result Value Ref Range   Glucose-Capillary 203 (H) 70 - 99 mg/dL  Glucose, capillary  Result Value Ref Range   Glucose-Capillary 155 (H) 70 - 99 mg/dL  Glucose, capillary  Result Value Ref Range   Glucose-Capillary 342 (H) 70 - 99 mg/dL  Glucose, capillary  Result Value Ref Range   Glucose-Capillary 447 (H) 70 - 99 mg/dL  CBC with Differential/Platelet  Result Value Ref Range   WBC 10.5 4.0 - 10.5 K/uL   RBC 4.31 3.87 - 5.11 MIL/uL   Hemoglobin 10.8 (L) 12.0 - 15.0 g/dL   HCT 37.7 36.0 - 46.0 %   MCV 87.5 80.0 - 100.0 fL   MCH 25.1 (L) 26.0 - 34.0 pg   MCHC 28.6 (L) 30.0 - 36.0 g/dL   RDW 15.4 11.5 - 15.5 %   Platelets 346 150 - 400 K/uL   nRBC 0.0 0.0 - 0.2 %   Neutrophils Relative % 86 %   Neutro Abs 9.1 (H) 1.7 - 7.7 K/uL   Lymphocytes Relative 9 %   Lymphs Abs 1.0 0.7 - 4.0 K/uL   Monocytes Relative 4 %   Monocytes Absolute 0.4 0.1 - 1.0 K/uL   Eosinophils Relative 0 %   Eosinophils Absolute 0.0 0.0 - 0.5 K/uL   Basophils Relative 0 %   Basophils Absolute 0.0 0.0 - 0.1 K/uL   Immature Granulocytes 1 %   Abs Immature Granulocytes 0.06 0.00 - 0.07 K/uL  Comprehensive metabolic panel  Result Value Ref Range   Sodium 145 135 - 145 mmol/L   Potassium 4.1 3.5 - 5.1 mmol/L   Chloride 105 98 - 111 mmol/L   CO2 31 22 - 32 mmol/L   Glucose, Bld 131 (H) 70 - 99 mg/dL   BUN 34 (H) 6 - 20 mg/dL   Creatinine, Ser 1.03 (H) 0.44 - 1.00 mg/dL   Calcium 7.9 (L) 8.9 - 10.3 mg/dL   Total Protein 7.2 6.5 - 8.1 g/dL   Albumin 2.7 (L) 3.5 - 5.0 g/dL   AST 17 15 - 41 U/L   ALT 18 0 - 44 U/L   Alkaline Phosphatase 60 38 - 126 U/L   Total Bilirubin 0.2 (L) 0.3 - 1.2 mg/dL   GFR calc non Af Amer >60 >60 mL/min   GFR calc Af Amer >60 >60 mL/min   Anion gap 9 5 - 15  C-reactive protein  Result Value Ref Range   CRP 4.7 (H) <1.0 mg/dL   D-dimer, quantitative (not at [facility])  Result Value Ref Range   D-Dimer, Quant 0.66 (H) 0.00 - 0.50 ug/mL-FEU  Ferritin  Result Value Ref Range   Ferritin 199 11 - 307 ng/mL  Magnesium  Result Value Ref Range   Magnesium 2.3 1.7 - 2.4 mg/dL  Phosphorus  Result Value Ref Range   Phosphorus 3.6 2.5 - 4.6 mg/dL  Glucose, capillary  Result Value Ref Range   Glucose-Capillary 379 (H) 70 - 99 mg/dL  Glucose, capillary  Result Value Ref Range   Glucose-Capillary 364 (H) 70 - 99 mg/dL  Glucose, capillary  Result Value Ref Range   Glucose-Capillary 267 (H) 70 - 99 mg/dL  Glucose, capillary  Result Value Ref Range   Glucose-Capillary 126 (H) 70 - 99 mg/dL  Glucose, capillary  Result Value Ref Range   Glucose-Capillary 138 (H) 70 - 99 mg/dL  Glucose, capillary  Result Value Ref Range   Glucose-Capillary 252 (H) 70 - 99 mg/dL  CBC with Differential/Platelet  Result Value Ref Range   WBC 10.4 4.0 - 10.5 K/uL   RBC 4.25 3.87 - 5.11 MIL/uL   Hemoglobin 10.5 (L) 12.0 - 15.0 g/dL   HCT 36.7 36.0 - 46.0 %  MCV 86.4 80.0 - 100.0 fL   MCH 24.7 (L) 26.0 - 34.0 pg   MCHC 28.6 (L) 30.0 - 36.0 g/dL   RDW 15.3 11.5 - 15.5 %   Platelets 348 150 - 400 K/uL   nRBC 0.0 0.0 - 0.2 %   Neutrophils Relative % 84 %   Neutro Abs 8.7 (H) 1.7 - 7.7 K/uL   Lymphocytes Relative 11 %   Lymphs Abs 1.1 0.7 - 4.0 K/uL   Monocytes Relative 4 %   Monocytes Absolute 0.4 0.1 - 1.0 K/uL   Eosinophils Relative 0 %   Eosinophils Absolute 0.0 0.0 - 0.5 K/uL   Basophils Relative 0 %   Basophils Absolute 0.0 0.0 - 0.1 K/uL   Immature Granulocytes 1 %   Abs Immature Granulocytes 0.13 (H) 0.00 - 0.07 K/uL  Comprehensive metabolic panel  Result Value Ref Range   Sodium 139 135 - 145 mmol/L   Potassium 4.4 3.5 - 5.1 mmol/L   Chloride 99 98 - 111 mmol/L   CO2 29 22 - 32 mmol/L   Glucose, Bld 240 (H) 70 - 99 mg/dL   BUN 32 (H) 6 - 20 mg/dL   Creatinine, Ser 0.99 0.44 - 1.00 mg/dL   Calcium 7.7 (L) 8.9 - 10.3  mg/dL   Total Protein 6.9 6.5 - 8.1 g/dL   Albumin 2.8 (L) 3.5 - 5.0 g/dL   AST 15 15 - 41 U/L   ALT 21 0 - 44 U/L   Alkaline Phosphatase 61 38 - 126 U/L   Total Bilirubin 0.7 0.3 - 1.2 mg/dL   GFR calc non Af Amer >60 >60 mL/min   GFR calc Af Amer >60 >60 mL/min   Anion gap 11 5 - 15  C-reactive protein  Result Value Ref Range   CRP 2.7 (H) <1.0 mg/dL  D-dimer, quantitative (not at [facility])  Result Value Ref Range   D-Dimer, Quant 0.67 (H) 0.00 - 0.50 ug/mL-FEU  Ferritin  Result Value Ref Range   Ferritin 173 11 - 307 ng/mL  Magnesium  Result Value Ref Range   Magnesium 2.3 1.7 - 2.4 mg/dL  Phosphorus  Result Value Ref Range   Phosphorus 3.3 2.5 - 4.6 mg/dL  Glucose, capillary  Result Value Ref Range   Glucose-Capillary 335 (H) 70 - 99 mg/dL  Glucose, capillary  Result Value Ref Range   Glucose-Capillary 313 (H) 70 - 99 mg/dL  Glucose, capillary  Result Value Ref Range   Glucose-Capillary 230 (H) 70 - 99 mg/dL  Glucose, capillary  Result Value Ref Range   Glucose-Capillary 369 (H) 70 - 99 mg/dL  Glucose, capillary  Result Value Ref Range   Glucose-Capillary 326 (H) 70 - 99 mg/dL  CBC with Differential/Platelet  Result Value Ref Range   WBC 12.8 (H) 4.0 - 10.5 K/uL   RBC 4.45 3.87 - 5.11 MIL/uL   Hemoglobin 11.1 (L) 12.0 - 15.0 g/dL   HCT 38.4 36.0 - 46.0 %   MCV 86.3 80.0 - 100.0 fL   MCH 24.9 (L) 26.0 - 34.0 pg   MCHC 28.9 (L) 30.0 - 36.0 g/dL   RDW 15.3 11.5 - 15.5 %   Platelets 378 150 - 400 K/uL   nRBC 0.0 0.0 - 0.2 %   Neutrophils Relative % 73 %   Neutro Abs 9.3 (H) 1.7 - 7.7 K/uL   Lymphocytes Relative 15 %   Lymphs Abs 1.9 0.7 - 4.0 K/uL   Monocytes Relative 10 %   Monocytes  Absolute 1.3 (H) 0.1 - 1.0 K/uL   Eosinophils Relative 0 %   Eosinophils Absolute 0.0 0.0 - 0.5 K/uL   Basophils Relative 0 %   Basophils Absolute 0.0 0.0 - 0.1 K/uL   Immature Granulocytes 2 %   Abs Immature Granulocytes 0.27 (H) 0.00 - 0.07 K/uL  Comprehensive metabolic  panel  Result Value Ref Range   Sodium 141 135 - 145 mmol/L   Potassium 4.2 3.5 - 5.1 mmol/L   Chloride 100 98 - 111 mmol/L   CO2 32 22 - 32 mmol/L   Glucose, Bld 204 (H) 70 - 99 mg/dL   BUN 36 (H) 6 - 20 mg/dL   Creatinine, Ser 0.99 0.44 - 1.00 mg/dL   Calcium 7.8 (L) 8.9 - 10.3 mg/dL   Total Protein 6.9 6.5 - 8.1 g/dL   Albumin 2.9 (L) 3.5 - 5.0 g/dL   AST 18 15 - 41 U/L   ALT 26 0 - 44 U/L   Alkaline Phosphatase 65 38 - 126 U/L   Total Bilirubin 0.4 0.3 - 1.2 mg/dL   GFR calc non Af Amer >60 >60 mL/min   GFR calc Af Amer >60 >60 mL/min   Anion gap 9 5 - 15  C-reactive protein  Result Value Ref Range   CRP 1.6 (H) <1.0 mg/dL  D-dimer, quantitative (not at [facility])  Result Value Ref Range   D-Dimer, Quant 0.71 (H) 0.00 - 0.50 ug/mL-FEU  Ferritin  Result Value Ref Range   Ferritin 150 11 - 307 ng/mL  Magnesium  Result Value Ref Range   Magnesium 2.1 1.7 - 2.4 mg/dL  Phosphorus  Result Value Ref Range   Phosphorus 2.4 (L) 2.5 - 4.6 mg/dL  Glucose, capillary  Result Value Ref Range   Glucose-Capillary 231 (H) 70 - 99 mg/dL  Glucose, capillary  Result Value Ref Range   Glucose-Capillary 110 (H) 70 - 99 mg/dL  Glucose, capillary  Result Value Ref Range   Glucose-Capillary 194 (H) 70 - 99 mg/dL  Glucose, capillary  Result Value Ref Range   Glucose-Capillary 184 (H) 70 - 99 mg/dL  Sample to Blood Bank  Result Value Ref Range   Blood Bank Specimen SAMPLE AVAILABLE FOR TESTING    Sample Expiration      02/06/2019,2359 Performed at [facility], [place] [address] [place],  [address]       Assessment & Plan:   Problem List Items Addressed This Visit      Cardiovascular and Mediastinum   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  Continue current medication regimen and collaboration with cardiology.  Recommend checking BP three mornings a week  at home + documenting for providers.  Return as scheduled.        Respiratory   Acute hypoxemic respiratory failure due to severe acute respiratory syndrome coronavirus 2 (SARS-CoV-2) disease ([place]) - Primary    Acute with improvement.  Continue to monitor O2 sat at home and continue quarantine until the 14th.  IF worsening SOB or difficulty breathing is aware to immediately go to ER.  At this time will plan to follow-up on 27th and obtain labs + repeat CXR upcoming.          Endocrine   Hyperlipidemia associated with type 2 diabetes mellitus ([place])    Chronic, ongoing with recent LDL 55 and TCHOL 113 at recent visit.  Continue current medication regimen as at goal.  Lipid panel next visit and CMP.      Type 2 diabetes mellitus treated with insulin ([facility])    Chronic, ongoing.  Followed by endo.  A1C last 6.8%.  Continue current medication regimen at this time, as ordered by endocrinology.  Continue collaboration with endocrinology.  Trend of A1C downward.  Would benefit from discontinuation of Glipizide and possibly addition of SGLT for heart with HF.  Return in 3 months.          Time: 30 minutes, >50% spent counseling on Covid   Follow up plan: Return for as scheduled.

## 2019-02-10 NOTE — Assessment & Plan Note (Signed)
Chronic, ongoing.  Followed by endo.  A1C last 6.8%.  Continue current medication regimen at this time, as ordered by endocrinology.  Continue collaboration with endocrinology.  Trend of A1C downward.  Would benefit from discontinuation of Glipizide and possibly addition of SGLT for heart with HF.  Return in 3 months.

## 2019-02-17 ENCOUNTER — Telehealth: Payer: Self-pay | Admitting: Nurse Practitioner

## 2019-02-17 ENCOUNTER — Encounter (INDEPENDENT_AMBULATORY_CARE_PROVIDER_SITE_OTHER): Payer: Self-pay

## 2019-02-17 NOTE — Chronic Care Management (AMB) (Signed)
  Care Management   Note  02/17/2019 Name: Sara Zhang MRN: [id number] DOB: 1970/08/05  Sara Zhang is a 49 y.o. year old female who is a primary care patient of Marjie Skiff, NP and is actively engaged with the care management team. I reached out to Sara Zhang by phone today to assist with scheduling a follow up appointment with the RN Case Manager  Follow up plan: Telephone appointment with care management team member scheduled for: 02/19/2019  [facility] Guide, Embedded Care Coordination [facility]  [place], [place] [address] Direct Dial: [phone] [email or web address]  [email or web address]: [email or web address]

## 2019-02-18 ENCOUNTER — Encounter (INDEPENDENT_AMBULATORY_CARE_PROVIDER_SITE_OTHER): Payer: Self-pay

## 2019-02-19 ENCOUNTER — Other Ambulatory Visit: Payer: Self-pay

## 2019-02-19 ENCOUNTER — Ambulatory Visit (INDEPENDENT_AMBULATORY_CARE_PROVIDER_SITE_OTHER): Payer: Medicare Other | Admitting: General Practice

## 2019-02-19 ENCOUNTER — Encounter (INDEPENDENT_AMBULATORY_CARE_PROVIDER_SITE_OTHER): Payer: Self-pay

## 2019-02-19 DIAGNOSIS — I10 Essential (primary) hypertension: Secondary | ICD-10-CM | POA: Diagnosis not present

## 2019-02-19 DIAGNOSIS — E785 Hyperlipidemia, unspecified: Secondary | ICD-10-CM

## 2019-02-19 DIAGNOSIS — E119 Type 2 diabetes mellitus without complications: Secondary | ICD-10-CM | POA: Diagnosis not present

## 2019-02-19 DIAGNOSIS — E1169 Type 2 diabetes mellitus with other specified complication: Secondary | ICD-10-CM

## 2019-02-19 DIAGNOSIS — N182 Chronic kidney disease, stage 2 (mild): Secondary | ICD-10-CM

## 2019-02-19 DIAGNOSIS — Z794 Long term (current) use of insulin: Secondary | ICD-10-CM | POA: Diagnosis not present

## 2019-02-19 DIAGNOSIS — I5032 Chronic diastolic (congestive) heart failure: Secondary | ICD-10-CM | POA: Diagnosis not present

## 2019-02-19 DIAGNOSIS — U071 COVID-19: Secondary | ICD-10-CM

## 2019-02-19 NOTE — Chronic Care Management (AMB) (Signed)
Chronic Care Management   Initial Visit Note  02/19/2019 Name: Sara Zhang MRN: [id number] DOB: Oct 22, 1970   Assessment: Sara Zhang is a 49 y.o. year old female who sees [place], Henrine Screws T, NP for primary care. The care management team was consulted for assistance with care management and care coordination needs related to Disease Management Educational Needs Care Coordination Medication Reconciliation Other follow up post discharge from hospital on 02-07-2019.   Review of patient status, including review of consultants reports, relevant laboratory and other test results, and collaboration with appropriate care team members and the patient's provider was performed as part of comprehensive patient evaluation and provision of care management services.    SDOH (Social Determinants of Health) screening performed today: Biomedical engineer  Food Insecurity  Social Connections Alcohol/Substance Use Tobacco Use Stress. See Care Plan for related entries.    Outpatient Encounter Medications as of 02/19/2019  Medication Sig Note   acetaminophen (TYLENOL) 500 MG tablet Take 1,000 mg by mouth every 6 (six) hours as needed (for pain.).    amLODipine (NORVASC) 5 MG tablet Take 1 tablet (5 mg total) by mouth daily.    atenolol (TENORMIN) 50 MG tablet TAKE 1 TABLET BY MOUTH ONCE DAILY (Patient taking differently: Take 50 mg by mouth daily. )    atorvastatin (LIPITOR) 10 MG tablet Take 1 tablet (10 mg total) by mouth daily.    blood glucose meter kit and supplies KIT Dispense based on patient and insurance preference. Use up to four times daily as directed. (FOR ICD-9 250.00, 250.01).    BYDUREON BCISE 2 MG/0.85ML AUIJ INJECT 2 MG UNDER THE SKIN EVERY WEEK (Patient taking differently: Inject 2 mg into the skin every Friday. )    Calcium Carbonate-Vitamin D (OYSTER SHELL CALCIUM 500 + D) 500-125 MG-UNIT TABS Take 1 tablet by mouth daily.     Cholecalciferol (VITAMIN D3) 10  MCG (400 UNIT) tablet Take 400 Units by mouth daily.    furosemide (LASIX) 40 MG tablet Take 1 tablet (40 mg total) by mouth 2 (two) times daily.    glucose blood (CONTOUR NEXT TEST) test strip TEST BLOOD SUGAR ONCE DAILY    Insulin Glargine (TOUJEO SOLOSTAR) 300 UNIT/ML SOPN Inject 54 Units into the skin at bedtime.    Insulin Pen Needle 32G X 4 MM MISC 1 Units by Does not apply route every morning. Pen needles    JARDIANCE 25 MG TABS tablet Take 25 mg by mouth daily.    levonorgestrel (MIRENA) 20 MCG/24HR IUD 1 each by Intrauterine route once.    lisinopril (ZESTRIL) 40 MG tablet TAKE 1 TABLET BY MOUTH EVERY DAY (Patient taking differently: Take 40 mg by mouth daily. )    OXYGEN Inhale 2 L into the lungs daily. 02/19/2019: The patient is using 2 liters during the day, and at night 3 liters- post discharge   No facility-administered encounter medications on file as of 02/19/2019.     Goals Addressed            This Visit's Progress    RNCM;  Disease Management and Care Coordination Needs related to COVID19 diagnosis in a patient with Chronic disease management needs       Current Barriers:   Disease Management support and education needs related to recent hospitalization for COVID19 positive test and symptoms of shortness of breath presenting to the ED with admit to the hospital from 02-03-2019 to 02-07-2019, in a patient with CHF, DM, CKD, Hyperlipidemia.  Nurse Case Manager Clinical Goal(s):   Over the next 30 days, patient will meet with RN Care Manager to address any new concerns/symptoms related to chronic conditions of CHF, DM, CKD, Hyperlipidemia and recent acute onset of COVID19 with admit to the hospital on 02-03-2019; with discharge from the hospital on 02-07-2019  Over the next 30 days, patient will not experience hospital admission. Hospital Admissions in last 6 months = 1  Over the next 30 days, patient will demonstrate a decrease in acute respiratory   exacerbations/symptoms as evidenced by being able to return to baseline oxygen therapy of 2 liters via [place] both during the day and at night  Over the next 30 days, patient will attend all scheduled medical appointments: Next appointment to see pcp is scheduled on 03-03-2019  Interventions:   Advised patient to notify provider of any changes in condition and to seek emergent care for new or worsening shortness of breath, or exacerbations of chronic conditions of HF, DM, CKD, and HLD  Provided education to patient re: COVID19  For information about COVID-19 or "Corona Virus", the following web resources may be helpful:  CDC: BeginnerSteps.be    [place]:  [email or web address]   Reviewed medications with patient and discussed notify CCM pharmacist or RNCM of changes  Discussed plans with patient for ongoing care management follow up and provided patient with direct contact information for care management team  Reviewed scheduled/upcoming provider appointments including: blood work with A1C lab work/follow up appointment with pcp on 03-03-2019  Advised patient, providing education and rationale, to check cbg TID or as directed by provider and record, calling provider for findings outside established parameters.  The patient expressed elevated CBG's during hospitalization due receiving steroids as a part of the treatment plan for increased shob with diagnosis of COVID 19    Patient Self Care Activities:   Patient verbalizes understanding of plan to follow up in 30 days with the [facility] or sooner if the patient has questions/needs assistance  Self administers medications as prescribed  Attends all scheduled provider appointments  Calls provider office for new concerns or questions  Start using new blood pressure cuff to measure and report blood pressure readings  to the provider and RNCM   Initial goal documentation        Ms. Kingma was given information about Chronic Care Management services today including:  1. CCM service includes personalized support from designated clinical staff supervised by her physician, including individualized plan of care and coordination with other care providers 2. 24/7 contact phone numbers for assistance for urgent and routine care needs. 3. Service will only be billed when office clinical staff spend 20 minutes or more in a month to coordinate care. 4. Only one practitioner may furnish and bill the service in a calendar month. 5. The patient may stop CCM services at any time (effective at the end of the month) by phone call to the office staff. 6. The patient will be responsible for cost sharing (co-pay) of up to 20% of the service fee (after annual deductible is met).  Patient agreed to services and verbal consent obtained.    Follow up plan:  The care management team will reach out to the patient again over the next 30 days.     Noreene Larsson RN, MSN, [facility] Mobile: [phone]

## 2019-02-19 NOTE — Patient Instructions (Addendum)
Visit Information Goals Addressed    . RNCM;  Disease Management and Care Coordination Needs related to COVID19 diagnosis in a patient with Chronic disease management needs       Current Barriers:  . Disease Management support and education needs related to recent hospitalization for COVID19 positive test and symptoms of shortness of breath presenting to the ED with admit to the hospital from 02-03-2019 to 02-07-2019, in a patient with CHF, DM, CKD, Hyperlipidemia.   Nurse Case Manager Clinical Goal(s):  Marland Kitchen Over the next 30 days, patient will meet with RN Care Manager to address any new concerns/symptoms related to chronic conditions of CHF, DM, CKD, Hyperlipidemia and recent acute onset of COVID19 with admit to the hospital on 02-03-2019; with discharge from the hospital on 02-07-2019 . Over the next 30 days, patient will not experience hospital admission. Hospital Admissions in last 6 months = 1 . Over the next 30 days, patient will demonstrate a decrease in acute respiratory  exacerbations/symptoms as evidenced by being able to return to baseline oxygen therapy of 2 liters via [place] both during the day and at night . Over the next 30 days, patient will attend all scheduled medical appointments: Next appointment to see pcp is scheduled on 03-03-2019  Interventions:  . Advised patient to notify provider of any changes in condition and to seek emergent care for new or worsening shortness of breath, or exacerbations of chronic conditions of HF, DM, CKD, and HLD . Provided education to patient re: COVID19 . For information about COVID-19 or "Corona Virus", the following web resources may be helpful: . CDC: BeginnerSteps.be   . [place]:  [email or web address]  . Reviewed medications with patient and discussed notify CCM pharmacist or RNCM of changes . Discussed plans with  patient for ongoing care management follow up and provided patient with direct contact information for care management team . Reviewed scheduled/upcoming provider appointments including: blood work with A1C lab work/follow up appointment with pcp on 03-03-2019 . Advised patient, providing education and rationale, to check cbg TID or as directed by provider and record, calling provider for findings outside established parameters.  The patient expressed elevated CBG's during hospitalization due receiving steroids as a part of the treatment plan for increased shob with diagnosis of COVID 19    Patient Self Care Activities:  . Patient verbalizes understanding of plan to follow up in 30 days with the [facility] or sooner if the patient has questions/needs assistance . Self administers medications as prescribed . Attends all scheduled provider appointments . Calls provider office for new concerns or questions . Start using new blood pressure cuff to measure and report blood pressure readings to the provider and RNCM   Initial goal documentation          Ms. Oloughlin was given information about Chronic Care Management services today including:  1. CCM service includes personalized support from designated clinical staff supervised by her physician, including individualized plan of care and coordination with other care providers 2. 24/7 contact phone numbers for assistance for urgent and routine care needs. 3. Service will only be billed when office clinical staff spend 20 minutes or more in a month to coordinate care. 4. Only one practitioner may furnish and bill the service in a calendar month. 5. The patient may stop CCM services at any time (effective at the end of the month) by phone call to the office staff. 6. The patient will be responsible for cost sharing (co-pay) of up to 20%  of the service fee (after annual deductible is met).  Patient agreed to services and verbal consent obtained.   The  patient verbalized understanding of instructions provided today and declined a print copy of patient instruction materials. The patient knows to utilize the My Chart feature to read instructions as needed  The care management team will reach out to the patient again over the next 30 days.    Noreene Larsson RN, MSN, [facility] Mobile: [phone]

## 2019-02-25 ENCOUNTER — Other Ambulatory Visit: Payer: Self-pay

## 2019-02-25 MED ORDER — FUROSEMIDE 40 MG PO TABS: 40.0000 mg | ORAL_TABLET | Freq: Two times a day (BID) | ORAL | 0 refills | Status: DC

## 2019-03-03 ENCOUNTER — Other Ambulatory Visit: Payer: Self-pay

## 2019-03-03 ENCOUNTER — Ambulatory Visit (INDEPENDENT_AMBULATORY_CARE_PROVIDER_SITE_OTHER): Payer: Medicare Other | Admitting: Nurse Practitioner

## 2019-03-03 ENCOUNTER — Encounter: Payer: Self-pay | Admitting: Nurse Practitioner

## 2019-03-03 DIAGNOSIS — I130 Hypertensive heart and chronic kidney disease with heart failure and stage 1 through stage 4 chronic kidney disease, or unspecified chronic kidney disease: Secondary | ICD-10-CM

## 2019-03-03 DIAGNOSIS — E1169 Type 2 diabetes mellitus with other specified complication: Secondary | ICD-10-CM | POA: Diagnosis not present

## 2019-03-03 DIAGNOSIS — E559 Vitamin D deficiency, unspecified: Secondary | ICD-10-CM

## 2019-03-03 DIAGNOSIS — E119 Type 2 diabetes mellitus without complications: Secondary | ICD-10-CM

## 2019-03-03 DIAGNOSIS — U071 COVID-19: Secondary | ICD-10-CM | POA: Diagnosis not present

## 2019-03-03 DIAGNOSIS — R7989 Other specified abnormal findings of blood chemistry: Secondary | ICD-10-CM | POA: Diagnosis not present

## 2019-03-03 DIAGNOSIS — Z6841 Body Mass Index (BMI) 40.0 and over, adult: Secondary | ICD-10-CM

## 2019-03-03 DIAGNOSIS — E785 Hyperlipidemia, unspecified: Secondary | ICD-10-CM | POA: Diagnosis not present

## 2019-03-03 DIAGNOSIS — J9601 Acute respiratory failure with hypoxia: Secondary | ICD-10-CM | POA: Diagnosis not present

## 2019-03-03 DIAGNOSIS — N182 Chronic kidney disease, stage 2 (mild): Secondary | ICD-10-CM

## 2019-03-03 DIAGNOSIS — I5032 Chronic diastolic (congestive) heart failure: Secondary | ICD-10-CM | POA: Diagnosis not present

## 2019-03-03 DIAGNOSIS — Z794 Long term (current) use of insulin: Secondary | ICD-10-CM

## 2019-03-03 NOTE — Patient Instructions (Signed)
COVID-19 COVID-19 is a respiratory infection that is caused by a virus called severe acute respiratory syndrome coronavirus 2 (SARS-CoV-2). The disease is also known as coronavirus disease or novel coronavirus. In some people, the virus may not cause any symptoms. In others, it may cause a serious infection. The infection can get worse quickly and can lead to complications, such as:  Pneumonia, or infection of the lungs.  Acute respiratory distress syndrome or ARDS. This is a condition in which fluid build-up in the lungs prevents the lungs from filling with air and passing oxygen into the blood.  Acute respiratory failure. This is a condition in which there is not enough oxygen passing from the lungs to the body or when carbon dioxide is not passing from the lungs out of the body.  Sepsis or septic shock. This is a serious bodily reaction to an infection.  Blood clotting problems.  Secondary infections due to bacteria or fungus.  Organ failure. This is when your body's organs stop working. The virus that causes COVID-19 is contagious. This means that it can spread from person to person through droplets from coughs and sneezes (respiratory secretions). What are the causes? This illness is caused by a virus. You may catch the virus by:  Breathing in droplets from an infected person. Droplets can be spread by a person breathing, speaking, singing, coughing, or sneezing.  Touching something, like a table or a doorknob, that was exposed to the virus (contaminated) and then touching your mouth, nose, or eyes. What increases the risk? Risk for infection You are more likely to be infected with this virus if you:  Are within 6 feet (2 meters) of a person with COVID-19.  Provide care for or live with a person who is infected with COVID-19.  Spend time in crowded indoor spaces or live in shared housing. Risk for serious illness You are more likely to become seriously ill from the virus if you:   Are 50 years of age or older. The higher your age, the more you are at risk for serious illness.  Live in a nursing home or long-term care facility.  Have cancer.  Have a long-term (chronic) disease such as: ? Chronic lung disease, including chronic obstructive pulmonary disease or asthma. ? A long-term disease that lowers your body's ability to fight infection (immunocompromised). ? Heart disease, including heart failure, a condition in which the arteries that lead to the heart become narrow or blocked (coronary artery disease), a disease which makes the heart muscle thick, weak, or stiff (cardiomyopathy). ? Diabetes. ? Chronic kidney disease. ? Sickle cell disease, a condition in which red blood cells have an abnormal "sickle" shape. ? Liver disease.  Are obese. What are the signs or symptoms? Symptoms of this condition can range from mild to severe. Symptoms may appear any time from 2 to 14 days after being exposed to the virus. They include:  A fever or chills.  A cough.  Difficulty breathing.  Headaches, body aches, or muscle aches.  Runny or stuffy (congested) nose.  A sore throat.  New loss of taste or smell. Some people may also have stomach problems, such as nausea, vomiting, or diarrhea. Other people may not have any symptoms of COVID-19. How is this diagnosed? This condition may be diagnosed based on:  Your signs and symptoms, especially if: ? You live in an area with a COVID-19 outbreak. ? You recently traveled to or from an area where the virus is common. ? You   provide care for or live with a person who was diagnosed with COVID-19. ? You were exposed to a person who was diagnosed with COVID-19.  A physical exam.  Lab tests, which may include: ? Taking a sample of fluid from the back of your nose and throat (nasopharyngeal fluid), your nose, or your throat using a swab. ? A sample of mucus from your lungs (sputum). ? Blood tests.  Imaging tests, which  may include, X-rays, CT scan, or ultrasound. How is this treated? At present, there is no medicine to treat COVID-19. Medicines that treat other diseases are being used on a trial basis to see if they are effective against COVID-19. Your health care provider will talk with you about ways to treat your symptoms. For most people, the infection is mild and can be managed at home with rest, fluids, and over-the-counter medicines. Treatment for a serious infection usually takes places in a hospital intensive care unit (ICU). It may include one or more of the following treatments. These treatments are given until your symptoms improve.  Receiving fluids and medicines through an IV.  Supplemental oxygen. Extra oxygen is given through a tube in the nose, a face mask, or a hood.  Positioning you to lie on your stomach (prone position). This makes it easier for oxygen to get into the lungs.  Continuous positive airway pressure (CPAP) or bi-level positive airway pressure (BPAP) machine. This treatment uses mild air pressure to keep the airways open. A tube that is connected to a motor delivers oxygen to the body.  Ventilator. This treatment moves air into and out of the lungs by using a tube that is placed in your windpipe.  Tracheostomy. This is a procedure to create a hole in the neck so that a breathing tube can be inserted.  Extracorporeal membrane oxygenation (ECMO). This procedure gives the lungs a chance to recover by taking over the functions of the heart and lungs. It supplies oxygen to the body and removes carbon dioxide. Follow these instructions at home: Lifestyle  If you are sick, stay home except to get medical care. Your health care provider will tell you how long to stay home. Call your health care provider before you go for medical care.  Rest at home as told by your health care provider.  Do not use any products that contain nicotine or tobacco, such as cigarettes, e-cigarettes, and  chewing tobacco. If you need help quitting, ask your health care provider.  Return to your normal activities as told by your health care provider. Ask your health care provider what activities are safe for you. General instructions  Take over-the-counter and prescription medicines only as told by your health care provider.  Drink enough fluid to keep your urine pale yellow.  Keep all follow-up visits as told by your health care provider. This is important. How is this prevented?  There is no vaccine to help prevent COVID-19 infection. However, there are steps you can take to protect yourself and others from this virus. To protect yourself:   Do not travel to areas where COVID-19 is a risk. The areas where COVID-19 is reported change often. To identify high-risk areas and travel restrictions, check the CDC travel website: wwwnc.cdc.gov/travel/notices  If you live in, or must travel to, an area where COVID-19 is a risk, take precautions to avoid infection. ? Stay away from people who are sick. ? Wash your hands often with soap and water for 20 seconds. If soap and water   are not available, use an alcohol-based hand sanitizer. ? Avoid touching your mouth, face, eyes, or nose. ? Avoid going out in public, follow guidance from your state and local health authorities. ? If you must go out in public, wear a cloth face covering or face mask. Make sure your mask covers your nose and mouth. ? Avoid crowded indoor spaces. Stay at least 6 feet (2 meters) away from others. ? Disinfect objects and surfaces that are frequently touched every day. This may include:  Counters and tables.  Doorknobs and light switches.  Sinks and faucets.  Electronics, such as phones, remote controls, keyboards, computers, and tablets. To protect others: If you have symptoms of COVID-19, take steps to prevent the virus from spreading to others.  If you think you have a COVID-19 infection, contact your health care  provider right away. Tell your health care team that you think you may have a COVID-19 infection.  Stay home. Leave your house only to seek medical care. Do not use public transport.  Do not travel while you are sick.  Wash your hands often with soap and water for 20 seconds. If soap and water are not available, use alcohol-based hand sanitizer.  Stay away from other members of your household. Let healthy household members care for children and pets, if possible. If you have to care for children or pets, wash your hands often and wear a mask. If possible, stay in your own room, separate from others. Use a different bathroom.  Make sure that all people in your household wash their hands well and often.  Cough or sneeze into a tissue or your sleeve or elbow. Do not cough or sneeze into your hand or into the air.  Wear a cloth face covering or face mask. Make sure your mask covers your nose and mouth. Where to find more information  Centers for Disease Control and Prevention: www.cdc.gov/coronavirus/2019-ncov/index.html  World Health Organization: www.who.int/health-topics/coronavirus Contact a health care provider if:  You live in or have traveled to an area where COVID-19 is a risk and you have symptoms of the infection.  You have had contact with someone who has COVID-19 and you have symptoms of the infection. Get help right away if:  You have trouble breathing.  You have pain or pressure in your chest.  You have confusion.  You have bluish lips and fingernails.  You have difficulty waking from sleep.  You have symptoms that get worse. These symptoms may represent a serious problem that is an emergency. Do not wait to see if the symptoms will go away. Get medical help right away. Call your local emergency services (911 in the [place] Do not drive yourself to the hospital. Let the emergency medical personnel know if you think you have COVID-19. Summary  COVID-19 is a  respiratory infection that is caused by a virus. It is also known as coronavirus disease or novel coronavirus. It can cause serious infections, such as pneumonia, acute respiratory distress syndrome, acute respiratory failure, or sepsis.  The virus that causes COVID-19 is contagious. This means that it can spread from person to person through droplets from breathing, speaking, singing, coughing, or sneezing.  You are more likely to develop a serious illness if you are 50 years of age or older, have a weak immune system, live in a nursing home, or have chronic disease.  There is no medicine to treat COVID-19. Your health care provider will talk with you about ways to treat your symptoms.    Take steps to protect yourself and others from infection. Wash your hands often and disinfect objects and surfaces that are frequently touched every day. Stay away from people who are sick and wear a mask if you are sick. This information is not intended to replace advice given to you by your health care provider. Make sure you discuss any questions you have with your health care provider. Document Revised: 11/20/2018 Document Reviewed: 02/26/2018 [employer] Patient Education  2020 [employer]  

## 2019-03-03 NOTE — Assessment & Plan Note (Signed)
Chronic, ongoing with recent LDL 55 and TCHOL 113 at recent visit.  Continue current medication regimen as is at goal.  Lipid panel outpatient.

## 2019-03-03 NOTE — Assessment & Plan Note (Signed)
Chronic, ongoing.  Followed by endo.  A1C last in December in hospital was 7.1%.  Continue current medication regimen at this time, as ordered by endocrinology.  Continue collaboration with endocrinology.  A1C may be slightly elevated next check due to hospitalization and steroids for Covid.  Return in 3 months.

## 2019-03-03 NOTE — Assessment & Plan Note (Signed)
Recommend continued focus on healthy diet choices and regular physical activity (30 minutes 5 days a week).  Focus on small goals at a time and set timeline to attain goal.

## 2019-03-03 NOTE — Assessment & Plan Note (Signed)
Continue supplement and recheck Vit D outpatient.

## 2019-03-03 NOTE — Assessment & Plan Note (Signed)
Chronic, ongoing with BP at goal recent visits.  Continue current medication regimen and collaboration with cardiology.  Recommend checking BP three mornings a week at home + documenting for providers.  Will obtain outpatient labs as recommended post hospitalization.  Return in 3 months.

## 2019-03-03 NOTE — Assessment & Plan Note (Signed)
Acute with improvement.  Continue to monitor O2 sat at home, is now off quarantine period.  If worsening SOB or difficulty breathing is aware to immediately go to ER.  Plan for follow-up with outpatient CBC & CMP + will obtain repeat CXR on 03/16/2019.

## 2019-03-03 NOTE — Assessment & Plan Note (Signed)
Chronic, ongoing with BP at goal.  Continue current medication regimen and collaboration with cardiology.  Recommend: - Reminded to call for an overnight weight gain of >2 pounds or a weekly weight weight of >5 pounds - not adding salt to his food and has been reading food labels. Reviewed the importance of keeping daily sodium intake to 2000mg  daily

## 2019-03-03 NOTE — Progress Notes (Signed)
There were no vitals taken for this visit.   Subjective:    Patient ID: Sara Zhang, female    DOB: 16-Mar-1970, 49 y.o.   MRN: [id number]  HPI: Sara Zhang is a 49 y.o. female  Chief Complaint  Patient presents with  . Diabetes  . Hyperlipidemia  . Hypertension    . This visit was completed via FaceTime due to the restrictions of the COVID-19 pandemic. All issues as above were discussed and addressed. Physical exam was done as above through visual confirmation on FaceTime. If it was felt that the patient should be evaluated in the office, they were directed there. The patient verbally consented to this visit. . Location of the patient: home . Location of the provider: work . Those involved with this call:  . Provider: Aura Dials, DNP . CMA: Wilhemena Durie, CMA . Front Desk/Registration: Adela Ports  . Time spent on call: 15 minutes with patient face to face via video conference. More than 50% of this time was spent in counseling and coordination of care. 10 minutes total spent in review of patient's record and preparation of their chart.  . I verified patient identity using two factors (patient name and date of birth). Patient consents verbally to being seen via telemedicine visit today.    DIABETES Current medications include Jardiance 25 MG, Toujeo 56 units QHS, and Bydureon 2 MG weekly. A1C in December 7.1%, prior to this was 6.8%.  Her previous A1C in January 2020 was 7.9%. She is followed by endocrinology and her last visit with them was 12/02/18 when Glipizide was discontinued and Jardiance started, is scheduled to see them in February.  Reports change in diet since Covid and higher readings since being sick with Covid.  States she had some 500's in the hospital when getting medication, but these have improved since being home.  Was given steroids in hospital. Polydipsia/polyuria: no Visual disturbance: no Chest pain: no Paresthesias: no Glucose  Monitoring: yes             Accucheck frequency: BID             Fasting glucose: 111 this morning, on average has been 90 to 110             Evening: 121-200             Before meals: Taking Insulin?: yes             Long acting insulin: 56 units             Short acting insulin: Blood Pressure Monitoring: not checking Retinal Examination: Not up to Date Foot Exam: Not up to Date Pneumovax: Up to Date Influenza: Up to Date Aspirin: no   HYPERTENSION / HYPERLIPIDEMIA/HF Current medications include Lisinopril, Furosemide, Atenolol for HTN + Lipitor for HLD. Followed by cardiology and last saw Dr. Okey Dupre 01/20/19, did start her on Amlodipine.  Is working on diet and trying to decrease her salty foods and pork skins.  History of low Vit D level, continues on supplement. History of elevation in TSH in past, although last in January 2020 was WNL.  She denies any hypothyroid symptoms at this time. Satisfied with current treatment? yes Duration of hypertension: chronic BP monitoring frequency: not checking BP range:  BP medication side effects: no Duration of hyperlipidemia: chronic Cholesterol medication side effects: no Cholesterol supplements: none Medication compliance: good compliance Aspirin: no Recent stressors: no Recurrent headaches: no Visual changes: no Palpitations: no Dyspnea:  occasional, baseline at this time Chest pain: no Lower extremity edema: no Dizzy/lightheaded: no  COVID PNA: At this time O2 sats 92-95% on 2L during day and 3L at night, for this time she has to use more O2 at night, prior only used 2L.  At this time she does endorse feeling better, but not 100%.  Is to remain on quarantine at home until 02/18/2019. Fever: no Cough: yes Shortness of breath: yes, "not much though", baseline Wheezing: a little bit Chest pain: no Chest tightness: no Chest congestion: no Nasal congestion: no Runny nose: no Post nasal drip: no Sneezing: no Sore throat: no  Swollen glands: no Sinus pressure: no Headache: no Face pain: no Toothache: no Ear pain: none Ear pressure: none Eyes red/itching:no Eye drainage/crusting: no  Vomiting: none Rash: no Fatigue: yes Sick contacts: yes  Relevant past medical, surgical, family and social history reviewed and updated as indicated. Interim medical history since our last visit reviewed. Allergies and medications reviewed and updated.  Review of Systems  Constitutional: Negative for activity change, appetite change, diaphoresis, fatigue and fever.  Respiratory: Positive for shortness of breath (improved). Negative for cough, chest tightness and wheezing.   Cardiovascular: Negative for chest pain, palpitations and leg swelling.  Gastrointestinal: Negative for abdominal distention, abdominal pain, constipation, diarrhea, nausea and vomiting.  Endocrine: Negative for cold intolerance, heat intolerance, polydipsia, polyphagia and polyuria.  Neurological: Negative for dizziness, syncope, weakness, light-headedness, numbness and headaches.  Psychiatric/Behavioral: Negative.     Per HPI unless specifically indicated above     Objective:    There were no vitals taken for this visit.  Wt Readings from Last 3 Encounters:  02/03/19 (!) 372 lb 9.2 oz (169 kg)  02/02/19 (!) 372 lb 9.2 oz (169 kg)  01/20/19 (!) 373 lb 12 oz (169.5 kg)    Physical Exam Vitals and nursing note reviewed.  Constitutional:      General: She is awake. She is not in acute distress.    Appearance: She is well-developed. She is obese. She is not ill-appearing.  HENT:     Head: Normocephalic.     Right Ear: Hearing normal.     Left Ear: Hearing normal.  Eyes:     General: Lids are normal.        Right eye: No discharge.        Left eye: No discharge.     Conjunctiva/sclera: Conjunctivae normal.  Pulmonary:     Effort: Pulmonary effort is normal. No accessory muscle usage or respiratory distress.     Comments: Unable to  auscultate due to virtual exam only.  No SOB with talking and no cough noted. Musculoskeletal:     Cervical back: Normal range of motion.  Neurological:     Mental Status: She is alert and oriented to person, place, and time.  Psychiatric:        Attention and Perception: Attention normal.        Mood and Affect: Mood normal.        Behavior: Behavior normal. Behavior is cooperative.        Thought Content: Thought content normal.        Judgment: Judgment normal.     Results for orders placed or performed during the hospital encounter of 02/03/19  HIV Antibody (routine testing w rflx)  Result Value Ref Range   HIV Screen 4th Generation wRfx NON REACTIVE NON REACTIVE  Brain natriuretic peptide  Result Value Ref Range   B Natriuretic Peptide  27.9 0.0 - 100.0 pg/mL  C-reactive protein  Result Value Ref Range   CRP 10.9 (H) <1.0 mg/dL  Comprehensive metabolic panel  Result Value Ref Range   Sodium 141 135 - 145 mmol/L   Potassium 4.1 3.5 - 5.1 mmol/L   Chloride 103 98 - 111 mmol/L   CO2 26 22 - 32 mmol/L   Glucose, Bld 184 (H) 70 - 99 mg/dL   BUN 22 (H) 6 - 20 mg/dL   Creatinine, Ser 1.26 (H) 0.44 - 1.00 mg/dL   Calcium 7.4 (L) 8.9 - 10.3 mg/dL   Total Protein 7.4 6.5 - 8.1 g/dL   Albumin 2.8 (L) 3.5 - 5.0 g/dL   AST 24 15 - 41 U/L   ALT 18 0 - 44 U/L   Alkaline Phosphatase 68 38 - 126 U/L   Total Bilirubin 0.5 0.3 - 1.2 mg/dL   GFR calc non Af Amer 50 (L) >60 mL/min   GFR calc Af Amer 58 (L) >60 mL/min   Anion gap 12 5 - 15  CBC with Differential/Platelet  Result Value Ref Range   WBC 6.2 4.0 - 10.5 K/uL   RBC 4.40 3.87 - 5.11 MIL/uL   Hemoglobin 11.2 (L) 12.0 - 15.0 g/dL   HCT 38.7 36.0 - 46.0 %   MCV 88.0 80.0 - 100.0 fL   MCH 25.5 (L) 26.0 - 34.0 pg   MCHC 28.9 (L) 30.0 - 36.0 g/dL   RDW 15.4 11.5 - 15.5 %   Platelets 288 150 - 400 K/uL   nRBC 0.0 0.0 - 0.2 %   Neutrophils Relative % 83 %   Neutro Abs 5.2 1.7 - 7.7 K/uL   Lymphocytes Relative 12 %   Lymphs  Abs 0.7 0.7 - 4.0 K/uL   Monocytes Relative 4 %   Monocytes Absolute 0.2 0.1 - 1.0 K/uL   Eosinophils Relative 0 %   Eosinophils Absolute 0.0 0.0 - 0.5 K/uL   Basophils Relative 0 %   Basophils Absolute 0.0 0.0 - 0.1 K/uL   Immature Granulocytes 1 %   Abs Immature Granulocytes 0.03 0.00 - 0.07 K/uL  D-dimer, quantitative (not at [facility])  Result Value Ref Range   D-Dimer, Quant 1.08 (H) 0.00 - 0.50 ug/mL-FEU  Ferritin  Result Value Ref Range   Ferritin 202 11 - 307 ng/mL  Procalcitonin  Result Value Ref Range   Procalcitonin <0.10 ng/mL  Hemoglobin A1c  Result Value Ref Range   Hgb A1c MFr Bld 7.1 (H) 4.8 - 5.6 %   Mean Plasma Glucose 157.07 mg/dL  Glucose, capillary  Result Value Ref Range   Glucose-Capillary 176 (H) 70 - 99 mg/dL  Glucose, capillary  Result Value Ref Range   Glucose-Capillary 311 (H) 70 - 99 mg/dL  CBC with Differential/Platelet  Result Value Ref Range   WBC 7.2 4.0 - 10.5 K/uL   RBC 4.53 3.87 - 5.11 MIL/uL   Hemoglobin 11.4 (L) 12.0 - 15.0 g/dL   HCT 39.4 36.0 - 46.0 %   MCV 87.0 80.0 - 100.0 fL   MCH 25.2 (L) 26.0 - 34.0 pg   MCHC 28.9 (L) 30.0 - 36.0 g/dL   RDW 15.4 11.5 - 15.5 %   Platelets 305 150 - 400 K/uL   nRBC 0.0 0.0 - 0.2 %   Neutrophils Relative % 80 %   Neutro Abs 5.8 1.7 - 7.7 K/uL   Lymphocytes Relative 12 %   Lymphs Abs 0.8 0.7 - 4.0 K/uL   Monocytes  Relative 7 %   Monocytes Absolute 0.5 0.1 - 1.0 K/uL   Eosinophils Relative 0 %   Eosinophils Absolute 0.0 0.0 - 0.5 K/uL   Basophils Relative 0 %   Basophils Absolute 0.0 0.0 - 0.1 K/uL   Immature Granulocytes 1 %   Abs Immature Granulocytes 0.05 0.00 - 0.07 K/uL  Comprehensive metabolic panel  Result Value Ref Range   Sodium 143 135 - 145 mmol/L   Potassium 4.0 3.5 - 5.1 mmol/L   Chloride 103 98 - 111 mmol/L   CO2 28 22 - 32 mmol/L   Glucose, Bld 224 (H) 70 - 99 mg/dL   BUN 35 (H) 6 - 20 mg/dL   Creatinine, Ser 5.62 (H) 0.44 - 1.00 mg/dL   Calcium 7.8 (L) 8.9 - 10.3 mg/dL    Total Protein 7.8 6.5 - 8.1 g/dL   Albumin 2.9 (L) 3.5 - 5.0 g/dL   AST 20 15 - 41 U/L   ALT 19 0 - 44 U/L   Alkaline Phosphatase 61 38 - 126 U/L   Total Bilirubin 0.4 0.3 - 1.2 mg/dL   GFR calc non Af Amer 51 (L) >60 mL/min   GFR calc Af Amer 59 (L) >60 mL/min   Anion gap 12 5 - 15  C-reactive protein  Result Value Ref Range   CRP 9.5 (H) <1.0 mg/dL  D-dimer, quantitative (not at [facility])  Result Value Ref Range   D-Dimer, Quant 0.77 (H) 0.00 - 0.50 ug/mL-FEU  Ferritin  Result Value Ref Range   Ferritin 227 11 - 307 ng/mL  Magnesium  Result Value Ref Range   Magnesium 2.4 1.7 - 2.4 mg/dL  Phosphorus  Result Value Ref Range   Phosphorus 3.6 2.5 - 4.6 mg/dL  Glucose, capillary  Result Value Ref Range   Glucose-Capillary 313 (H) 70 - 99 mg/dL  Glucose, capillary  Result Value Ref Range   Glucose-Capillary 430 (H) 70 - 99 mg/dL  Glucose, random  Result Value Ref Range   Glucose, Bld 387 (H) 70 - 99 mg/dL  Glucose, capillary  Result Value Ref Range   Glucose-Capillary 309 (H) 70 - 99 mg/dL  Glucose, capillary  Result Value Ref Range   Glucose-Capillary 203 (H) 70 - 99 mg/dL  Glucose, capillary  Result Value Ref Range   Glucose-Capillary 155 (H) 70 - 99 mg/dL  Glucose, capillary  Result Value Ref Range   Glucose-Capillary 342 (H) 70 - 99 mg/dL  Glucose, capillary  Result Value Ref Range   Glucose-Capillary 447 (H) 70 - 99 mg/dL  CBC with Differential/Platelet  Result Value Ref Range   WBC 10.5 4.0 - 10.5 K/uL   RBC 4.31 3.87 - 5.11 MIL/uL   Hemoglobin 10.8 (L) 12.0 - 15.0 g/dL   HCT 13.0 86.5 - 78.4 %   MCV 87.5 80.0 - 100.0 fL   MCH 25.1 (L) 26.0 - 34.0 pg   MCHC 28.6 (L) 30.0 - 36.0 g/dL   RDW 69.6 29.5 - 28.4 %   Platelets 346 150 - 400 K/uL   nRBC 0.0 0.0 - 0.2 %   Neutrophils Relative % 86 %   Neutro Abs 9.1 (H) 1.7 - 7.7 K/uL   Lymphocytes Relative 9 %   Lymphs Abs 1.0 0.7 - 4.0 K/uL   Monocytes Relative 4 %   Monocytes Absolute 0.4 0.1 - 1.0 K/uL    Eosinophils Relative 0 %   Eosinophils Absolute 0.0 0.0 - 0.5 K/uL   Basophils Relative 0 %  Basophils Absolute 0.0 0.0 - 0.1 K/uL   Immature Granulocytes 1 %   Abs Immature Granulocytes 0.06 0.00 - 0.07 K/uL  Comprehensive metabolic panel  Result Value Ref Range   Sodium 145 135 - 145 mmol/L   Potassium 4.1 3.5 - 5.1 mmol/L   Chloride 105 98 - 111 mmol/L   CO2 31 22 - 32 mmol/L   Glucose, Bld 131 (H) 70 - 99 mg/dL   BUN 34 (H) 6 - 20 mg/dL   Creatinine, Ser 1.61 (H) 0.44 - 1.00 mg/dL   Calcium 7.9 (L) 8.9 - 10.3 mg/dL   Total Protein 7.2 6.5 - 8.1 g/dL   Albumin 2.7 (L) 3.5 - 5.0 g/dL   AST 17 15 - 41 U/L   ALT 18 0 - 44 U/L   Alkaline Phosphatase 60 38 - 126 U/L   Total Bilirubin 0.2 (L) 0.3 - 1.2 mg/dL   GFR calc non Af Amer >60 >60 mL/min   GFR calc Af Amer >60 >60 mL/min   Anion gap 9 5 - 15  C-reactive protein  Result Value Ref Range   CRP 4.7 (H) <1.0 mg/dL  D-dimer, quantitative (not at [facility])  Result Value Ref Range   D-Dimer, Quant 0.66 (H) 0.00 - 0.50 ug/mL-FEU  Ferritin  Result Value Ref Range   Ferritin 199 11 - 307 ng/mL  Magnesium  Result Value Ref Range   Magnesium 2.3 1.7 - 2.4 mg/dL  Phosphorus  Result Value Ref Range   Phosphorus 3.6 2.5 - 4.6 mg/dL  Glucose, capillary  Result Value Ref Range   Glucose-Capillary 379 (H) 70 - 99 mg/dL  Glucose, capillary  Result Value Ref Range   Glucose-Capillary 364 (H) 70 - 99 mg/dL  Glucose, capillary  Result Value Ref Range   Glucose-Capillary 267 (H) 70 - 99 mg/dL  Glucose, capillary  Result Value Ref Range   Glucose-Capillary 126 (H) 70 - 99 mg/dL  Glucose, capillary  Result Value Ref Range   Glucose-Capillary 138 (H) 70 - 99 mg/dL  Glucose, capillary  Result Value Ref Range   Glucose-Capillary 252 (H) 70 - 99 mg/dL  CBC with Differential/Platelet  Result Value Ref Range   WBC 10.4 4.0 - 10.5 K/uL   RBC 4.25 3.87 - 5.11 MIL/uL   Hemoglobin 10.5 (L) 12.0 - 15.0 g/dL   HCT 09.6 04.5 - 40.9 %   MCV  86.4 80.0 - 100.0 fL   MCH 24.7 (L) 26.0 - 34.0 pg   MCHC 28.6 (L) 30.0 - 36.0 g/dL   RDW 81.1 91.4 - 78.2 %   Platelets 348 150 - 400 K/uL   nRBC 0.0 0.0 - 0.2 %   Neutrophils Relative % 84 %   Neutro Abs 8.7 (H) 1.7 - 7.7 K/uL   Lymphocytes Relative 11 %   Lymphs Abs 1.1 0.7 - 4.0 K/uL   Monocytes Relative 4 %   Monocytes Absolute 0.4 0.1 - 1.0 K/uL   Eosinophils Relative 0 %   Eosinophils Absolute 0.0 0.0 - 0.5 K/uL   Basophils Relative 0 %   Basophils Absolute 0.0 0.0 - 0.1 K/uL   Immature Granulocytes 1 %   Abs Immature Granulocytes 0.13 (H) 0.00 - 0.07 K/uL  Comprehensive metabolic panel  Result Value Ref Range   Sodium 139 135 - 145 mmol/L   Potassium 4.4 3.5 - 5.1 mmol/L   Chloride 99 98 - 111 mmol/L   CO2 29 22 - 32 mmol/L   Glucose, Bld 240 (H) 70 -  99 mg/dL   BUN 32 (H) 6 - 20 mg/dL   Creatinine, Ser 3.52 0.44 - 1.00 mg/dL   Calcium 7.7 (L) 8.9 - 10.3 mg/dL   Total Protein 6.9 6.5 - 8.1 g/dL   Albumin 2.8 (L) 3.5 - 5.0 g/dL   AST 15 15 - 41 U/L   ALT 21 0 - 44 U/L   Alkaline Phosphatase 61 38 - 126 U/L   Total Bilirubin 0.7 0.3 - 1.2 mg/dL   GFR calc non Af Amer >60 >60 mL/min   GFR calc Af Amer >60 >60 mL/min   Anion gap 11 5 - 15  C-reactive protein  Result Value Ref Range   CRP 2.7 (H) <1.0 mg/dL  D-dimer, quantitative (not at [facility])  Result Value Ref Range   D-Dimer, Quant 0.67 (H) 0.00 - 0.50 ug/mL-FEU  Ferritin  Result Value Ref Range   Ferritin 173 11 - 307 ng/mL  Magnesium  Result Value Ref Range   Magnesium 2.3 1.7 - 2.4 mg/dL  Phosphorus  Result Value Ref Range   Phosphorus 3.3 2.5 - 4.6 mg/dL  Glucose, capillary  Result Value Ref Range   Glucose-Capillary 335 (H) 70 - 99 mg/dL  Glucose, capillary  Result Value Ref Range   Glucose-Capillary 313 (H) 70 - 99 mg/dL  Glucose, capillary  Result Value Ref Range   Glucose-Capillary 230 (H) 70 - 99 mg/dL  Glucose, capillary  Result Value Ref Range   Glucose-Capillary 369 (H) 70 - 99 mg/dL   Glucose, capillary  Result Value Ref Range   Glucose-Capillary 326 (H) 70 - 99 mg/dL  CBC with Differential/Platelet  Result Value Ref Range   WBC 12.8 (H) 4.0 - 10.5 K/uL   RBC 4.45 3.87 - 5.11 MIL/uL   Hemoglobin 11.1 (L) 12.0 - 15.0 g/dL   HCT 48.1 85.9 - 09.3 %   MCV 86.3 80.0 - 100.0 fL   MCH 24.9 (L) 26.0 - 34.0 pg   MCHC 28.9 (L) 30.0 - 36.0 g/dL   RDW 11.2 16.2 - 44.6 %   Platelets 378 150 - 400 K/uL   nRBC 0.0 0.0 - 0.2 %   Neutrophils Relative % 73 %   Neutro Abs 9.3 (H) 1.7 - 7.7 K/uL   Lymphocytes Relative 15 %   Lymphs Abs 1.9 0.7 - 4.0 K/uL   Monocytes Relative 10 %   Monocytes Absolute 1.3 (H) 0.1 - 1.0 K/uL   Eosinophils Relative 0 %   Eosinophils Absolute 0.0 0.0 - 0.5 K/uL   Basophils Relative 0 %   Basophils Absolute 0.0 0.0 - 0.1 K/uL   Immature Granulocytes 2 %   Abs Immature Granulocytes 0.27 (H) 0.00 - 0.07 K/uL  Comprehensive metabolic panel  Result Value Ref Range   Sodium 141 135 - 145 mmol/L   Potassium 4.2 3.5 - 5.1 mmol/L   Chloride 100 98 - 111 mmol/L   CO2 32 22 - 32 mmol/L   Glucose, Bld 204 (H) 70 - 99 mg/dL   BUN 36 (H) 6 - 20 mg/dL   Creatinine, Ser 9.50 0.44 - 1.00 mg/dL   Calcium 7.8 (L) 8.9 - 10.3 mg/dL   Total Protein 6.9 6.5 - 8.1 g/dL   Albumin 2.9 (L) 3.5 - 5.0 g/dL   AST 18 15 - 41 U/L   ALT 26 0 - 44 U/L   Alkaline Phosphatase 65 38 - 126 U/L   Total Bilirubin 0.4 0.3 - 1.2 mg/dL   GFR calc non Af Amer >  60 >60 mL/min   GFR calc Af Amer >60 >60 mL/min   Anion gap 9 5 - 15  C-reactive protein  Result Value Ref Range   CRP 1.6 (H) <1.0 mg/dL  D-dimer, quantitative (not at [facility])  Result Value Ref Range   D-Dimer, Quant 0.71 (H) 0.00 - 0.50 ug/mL-FEU  Ferritin  Result Value Ref Range   Ferritin 150 11 - 307 ng/mL  Magnesium  Result Value Ref Range   Magnesium 2.1 1.7 - 2.4 mg/dL  Phosphorus  Result Value Ref Range   Phosphorus 2.4 (L) 2.5 - 4.6 mg/dL  Glucose, capillary  Result Value Ref Range   Glucose-Capillary  231 (H) 70 - 99 mg/dL  Glucose, capillary  Result Value Ref Range   Glucose-Capillary 110 (H) 70 - 99 mg/dL  Glucose, capillary  Result Value Ref Range   Glucose-Capillary 194 (H) 70 - 99 mg/dL  Glucose, capillary  Result Value Ref Range   Glucose-Capillary 184 (H) 70 - 99 mg/dL  Sample to Blood Bank  Result Value Ref Range   Blood Bank Specimen SAMPLE AVAILABLE FOR TESTING    Sample Expiration      02/06/2019,2359 Performed at [facility], 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  Continue current medication regimen and collaboration with cardiology.  Recommend checking BP three mornings a week at home + documenting for providers.  Will obtain outpatient labs as recommended post hospitalization.  Return in 3 months.      Relevant Orders   Comprehensive metabolic panel   TSH   Chronic heart failure with preserved ejection fraction (HCC)    Chronic, ongoing with BP at goal.  Continue current medication regimen and collaboration with cardiology.  Recommend: - Reminded to call for an overnight weight gain of >2 pounds or a weekly weight weight of >5 pounds - not adding salt to his food and has been reading food labels. Reviewed the importance of keeping daily sodium intake to 2000mg  daily       Relevant Orders   Comprehensive metabolic panel   TSH     Respiratory   Acute hypoxemic respiratory failure due to severe acute respiratory syndrome coronavirus 2 (SARS-CoV-2) disease (HCC)    Acute with improvement.  Continue to monitor O2 sat at home, is now off quarantine period.  If worsening SOB or difficulty breathing is aware to immediately go to ER.  Plan for follow-up with outpatient CBC & CMP + will obtain  repeat CXR on 03/16/2019.      Relevant Orders   Comprehensive metabolic panel   CBC with Differential/Platelet   TSH     Endocrine   Hyperlipidemia associated with type 2 diabetes mellitus (HCC)    Chronic, ongoing with recent LDL 55 and TCHOL 113 at recent visit.  Continue current medication regimen as is at goal.  Lipid panel outpatient.      Relevant Orders   Lipid Panel Piccolo, Waived   Type 2 diabetes mellitus treated with insulin (HCC)    Chronic, ongoing.  Followed by endo.  A1C last in December in hospital was 7.1%.  Continue current medication regimen at this time, as ordered by endocrinology.  Continue collaboration  with endocrinology.  A1C may be slightly elevated next check due to hospitalization and steroids for Covid.  Return in 3 months.       Relevant Orders   Microalbumin, Urine Waived   TSH     Other   Morbid obesity (HCC)    Recommend continued focus on healthy diet choices and regular physical activity (30 minutes 5 days a week).  Focus on small goals at a time and set timeline to attain goal.      Vitamin D deficiency    Continue supplement and recheck Vit D outpatient.      Relevant Orders   VITAMIN D 25 Hydroxy (Vit-D Deficiency, Fractures)   Elevated TSH    Recheck TSH outpatient, as has been one year since last check.  Initiate medication as needed.  Last TSH was WNL.      Relevant Orders   TSH   BMI 50.0-59.9, adult (HCC)    Recommend continued focus on healthy diet choices and regular physical activity (30 minutes 5 days a week).  Continue current medication regimen for chronic health issues.          Follow up plan: Return in about 3 months (around 06/01/2019) for T2DM, HTN/HLD, HF, Lungs.

## 2019-03-03 NOTE — Assessment & Plan Note (Signed)
Recommend continued focus on healthy diet choices and regular physical activity (30 minutes 5 days a week).  Continue current medication regimen for chronic health issues.

## 2019-03-03 NOTE — Assessment & Plan Note (Signed)
Recheck TSH outpatient, as has been one year since last check.  Initiate medication as needed.  Last TSH was WNL.

## 2019-03-09 ENCOUNTER — Ambulatory Visit: Payer: Self-pay | Admitting: Pharmacist

## 2019-03-09 ENCOUNTER — Telehealth: Payer: Self-pay

## 2019-03-09 NOTE — Chronic Care Management (AMB) (Signed)
  Chronic Care Management   Note  03/09/2019 Name: Sara Zhang MRN: [id number] DOB: Oct 14, 1970  Sara Zhang is a 49 y.o. year old female who is a primary care patient of Cannady, Dorie Rank, NP. The CCM team was consulted for assistance with chronic disease management and care coordination needs.    Attempted to contact patient as scheduled for medication management review. Left HIPAA compliant message for patient to give me a call at her convenience.   Follow up plan: - Will collaborate w/ Care Guide to outreach for a follow up phone call with me  Catie Feliz Beam, PharmD, [facility] Clinical Pharmacist [facility] [phone]

## 2019-03-10 ENCOUNTER — Telehealth: Payer: Self-pay | Admitting: Nurse Practitioner

## 2019-03-10 ENCOUNTER — Other Ambulatory Visit: Payer: Self-pay | Admitting: Internal Medicine

## 2019-03-10 NOTE — Chronic Care Management (AMB) (Signed)
  Care Management   Note  03/10/2019 Name: [name] MRN: [id number] DOB: 10-05-1970  Sara Zhang is a 49 y.o. year old female who is a primary care patient of Marjie Skiff, NP and is actively engaged with the care management team. I reached out to Sara Zhang by phone today to assist with re-scheduling a follow up visit with the Pharmacist  Follow up plan: Telephone appointment with care management team member scheduled for:04/16/2019  Penne Lash, RMA Care Guide, Embedded Care Coordination [facility]  [place], [place] [address] Direct Dial: [phone] [email or web address]: [email or web address]

## 2019-03-16 ENCOUNTER — Ambulatory Visit
Admission: RE | Admit: 2019-03-16 | Discharge: 2019-03-16 | Disposition: A | Discharge status: Home / Self Care | Payer: Medicare Other | Source: Ambulatory Visit | Attending: Nurse Practitioner | Admitting: Nurse Practitioner

## 2019-03-16 ENCOUNTER — Ambulatory Visit
Admission: RE | Admit: 2019-03-16 | Discharge: 2019-03-16 | Disposition: A | Discharge status: Home / Self Care | Payer: Medicare Other | Attending: Nurse Practitioner | Admitting: Nurse Practitioner

## 2019-03-16 ENCOUNTER — Other Ambulatory Visit: Payer: Self-pay

## 2019-03-16 ENCOUNTER — Other Ambulatory Visit: Payer: Self-pay | Admitting: Nurse Practitioner

## 2019-03-16 ENCOUNTER — Ambulatory Visit: Payer: Medicare Other

## 2019-03-16 DIAGNOSIS — J1282 Pneumonia due to coronavirus disease 2019: Secondary | ICD-10-CM | POA: Diagnosis not present

## 2019-03-16 DIAGNOSIS — U071 COVID-19: Secondary | ICD-10-CM | POA: Insufficient documentation

## 2019-03-16 DIAGNOSIS — R05 Cough: Secondary | ICD-10-CM | POA: Diagnosis not present

## 2019-03-16 NOTE — Progress Notes (Signed)
Contacted via MyChart

## 2019-03-26 ENCOUNTER — Other Ambulatory Visit: Payer: Medicare Other

## 2019-03-31 DIAGNOSIS — R809 Proteinuria, unspecified: Secondary | ICD-10-CM | POA: Diagnosis not present

## 2019-03-31 DIAGNOSIS — I10 Essential (primary) hypertension: Secondary | ICD-10-CM | POA: Diagnosis not present

## 2019-03-31 DIAGNOSIS — E785 Hyperlipidemia, unspecified: Secondary | ICD-10-CM | POA: Diagnosis not present

## 2019-03-31 DIAGNOSIS — E1165 Type 2 diabetes mellitus with hyperglycemia: Secondary | ICD-10-CM | POA: Diagnosis not present

## 2019-03-31 DIAGNOSIS — E1129 Type 2 diabetes mellitus with other diabetic kidney complication: Secondary | ICD-10-CM | POA: Diagnosis not present

## 2019-03-31 DIAGNOSIS — E1169 Type 2 diabetes mellitus with other specified complication: Secondary | ICD-10-CM | POA: Diagnosis not present

## 2019-03-31 DIAGNOSIS — E1159 Type 2 diabetes mellitus with other circulatory complications: Secondary | ICD-10-CM | POA: Diagnosis not present

## 2019-03-31 DIAGNOSIS — E559 Vitamin D deficiency, unspecified: Secondary | ICD-10-CM | POA: Diagnosis not present

## 2019-03-31 DIAGNOSIS — G4733 Obstructive sleep apnea (adult) (pediatric): Secondary | ICD-10-CM | POA: Diagnosis not present

## 2019-03-31 DIAGNOSIS — Z794 Long term (current) use of insulin: Secondary | ICD-10-CM | POA: Diagnosis not present

## 2019-03-31 DIAGNOSIS — Z6841 Body Mass Index (BMI) 40.0 and over, adult: Secondary | ICD-10-CM | POA: Diagnosis not present

## 2019-04-01 ENCOUNTER — Other Ambulatory Visit: Payer: Medicare Other

## 2019-04-01 ENCOUNTER — Other Ambulatory Visit: Payer: Self-pay

## 2019-04-01 DIAGNOSIS — Z794 Long term (current) use of insulin: Secondary | ICD-10-CM

## 2019-04-01 DIAGNOSIS — E119 Type 2 diabetes mellitus without complications: Secondary | ICD-10-CM

## 2019-04-01 DIAGNOSIS — U071 COVID-19: Secondary | ICD-10-CM

## 2019-04-01 DIAGNOSIS — E1169 Type 2 diabetes mellitus with other specified complication: Secondary | ICD-10-CM

## 2019-04-01 DIAGNOSIS — E785 Hyperlipidemia, unspecified: Secondary | ICD-10-CM

## 2019-04-01 DIAGNOSIS — E559 Vitamin D deficiency, unspecified: Secondary | ICD-10-CM

## 2019-04-01 DIAGNOSIS — I130 Hypertensive heart and chronic kidney disease with heart failure and stage 1 through stage 4 chronic kidney disease, or unspecified chronic kidney disease: Secondary | ICD-10-CM

## 2019-04-01 DIAGNOSIS — I5032 Chronic diastolic (congestive) heart failure: Secondary | ICD-10-CM | POA: Diagnosis not present

## 2019-04-01 DIAGNOSIS — N182 Chronic kidney disease, stage 2 (mild): Secondary | ICD-10-CM | POA: Diagnosis not present

## 2019-04-01 DIAGNOSIS — J9601 Acute respiratory failure with hypoxia: Secondary | ICD-10-CM | POA: Diagnosis not present

## 2019-04-01 DIAGNOSIS — R7989 Other specified abnormal findings of blood chemistry: Secondary | ICD-10-CM | POA: Diagnosis not present

## 2019-04-01 LAB — LIPID PANEL PICCOLO, WAIVED
Chol/HDL Ratio Piccolo,Waive: 2.8 mg/dL
Cholesterol Piccolo, Waived: 100 mg/dL (ref <200)
HDL Chol Piccolo, Waived: 36 mg/dL — ABNORMAL LOW (ref >59)
LDL Chol Calc Piccolo Waived: 51 mg/dL (ref <100)
Triglycerides Piccolo,Waived: 66 mg/dL (ref <150)
VLDL Chol Calc Piccolo,Waive: 13 mg/dL (ref <30)

## 2019-04-01 LAB — MICROALBUMIN, URINE WAIVED
Creatinine, Urine Waived: 100 mg/dL (ref 10–300)
Microalb, Ur Waived: 80 mg/L — ABNORMAL HIGH (ref 0–19)

## 2019-04-02 LAB — CBC WITH DIFFERENTIAL/PLATELET
Basophils Absolute: 0 10*3/uL (ref 0.0–0.2)
Basos: 1 %
EOS (ABSOLUTE): 0.2 10*3/uL (ref 0.0–0.4)
Eos: 3 %
Hematocrit: 37.1 % (ref 34.0–46.6)
Hemoglobin: 11.2 g/dL (ref 11.1–15.9)
Immature Grans (Abs): 0.1 10*3/uL (ref 0.0–0.1)
Immature Granulocytes: 1 %
Lymphocytes Absolute: 1.5 10*3/uL (ref 0.7–3.1)
Lymphs: 18 %
MCH: 25.4 pg — ABNORMAL LOW (ref 26.6–33.0)
MCHC: 30.2 g/dL — ABNORMAL LOW (ref 31.5–35.7)
MCV: 84 fL (ref 79–97)
Monocytes Absolute: 0.6 10*3/uL (ref 0.1–0.9)
Monocytes: 8 %
Neutrophils Absolute: 6 10*3/uL (ref 1.4–7.0)
Neutrophils: 69 %
Platelets: 362 10*3/uL (ref 150–450)
RBC: 4.41 x10E6/uL (ref 3.77–5.28)
RDW: 14.2 % (ref 11.7–15.4)
WBC: 8.4 10*3/uL (ref 3.4–10.8)

## 2019-04-02 LAB — COMPREHENSIVE METABOLIC PANEL
ALT: 7 IU/L (ref 0–32)
AST: 12 IU/L (ref 0–40)
Albumin/Globulin Ratio: 1.2 (ref 1.2–2.2)
Albumin: 3.6 g/dL — ABNORMAL LOW (ref 3.8–4.8)
Alkaline Phosphatase: 104 IU/L (ref 39–117)
BUN/Creatinine Ratio: 13 (ref 9–23)
BUN: 15 mg/dL (ref 6–24)
Bilirubin Total: 0.3 mg/dL (ref 0.0–1.2)
CO2: 25 mmol/L (ref 20–29)
Calcium: 8.3 mg/dL — ABNORMAL LOW (ref 8.7–10.2)
Chloride: 99 mmol/L (ref 96–106)
Creatinine, Ser: 1.16 mg/dL — ABNORMAL HIGH (ref 0.57–1.00)
GFR calc Af Amer: 63 mL/min/{1.73_m2} (ref >59)
GFR calc non Af Amer: 55 mL/min/{1.73_m2} — ABNORMAL LOW (ref >59)
Globulin, Total: 3 g/dL (ref 1.5–4.5)
Glucose: 188 mg/dL — ABNORMAL HIGH (ref 65–99)
Potassium: 4.5 mmol/L (ref 3.5–5.2)
Sodium: 140 mmol/L (ref 134–144)
Total Protein: 6.6 g/dL (ref 6.0–8.5)

## 2019-04-02 LAB — VITAMIN D 25 HYDROXY (VIT D DEFICIENCY, FRACTURES): Vit D, 25-Hydroxy: 38.6 ng/mL (ref 30.0–100.0)

## 2019-04-02 LAB — TSH: TSH: 1.47 u[IU]/mL (ref 0.450–4.500)

## 2019-04-02 NOTE — Progress Notes (Signed)
Contacted via MyChart

## 2019-04-06 DIAGNOSIS — H40003 Preglaucoma, unspecified, bilateral: Secondary | ICD-10-CM | POA: Diagnosis not present

## 2019-04-13 DIAGNOSIS — H40003 Preglaucoma, unspecified, bilateral: Secondary | ICD-10-CM | POA: Diagnosis not present

## 2019-04-16 ENCOUNTER — Ambulatory Visit (INDEPENDENT_AMBULATORY_CARE_PROVIDER_SITE_OTHER): Payer: Medicare Other | Admitting: Pharmacist

## 2019-04-16 DIAGNOSIS — Z794 Long term (current) use of insulin: Secondary | ICD-10-CM

## 2019-04-16 DIAGNOSIS — I5032 Chronic diastolic (congestive) heart failure: Secondary | ICD-10-CM | POA: Diagnosis not present

## 2019-04-16 DIAGNOSIS — E119 Type 2 diabetes mellitus without complications: Secondary | ICD-10-CM | POA: Diagnosis not present

## 2019-04-16 NOTE — Patient Instructions (Signed)
Visit Information  Goals Addressed            This Visit's Progress     Patient Stated   . PharmD "I have a lot of medications" (pt-stated)       CARE PLAN ENTRY (see longtitudinal plan of care for additional care plan information)  Current Barriers:  . Diabetes: uncontrolled, most recent A1c 7.1% . Current antihyperglycemic regimen: Toujeo 54 units, Bydureon 2 mg weekly, Jardiance 25 mg daily  o Follows w/ [facility] endocrinology; last appt 2/24, next in May o Continues on [place] as [place] Medicaid does not cover another weekly GLP1 (only Victoza) . Denies episodes of hypoglycemia since d/c glipizide. Notes that she is very happy about this change  . Current blood glucose readings o Fastings: all <130 o 2 hour post prandial: reports generally at goal <180, though occasionally elevated if she "cheats" and has sodas, candy . Current meal patterns:  o Drinks: notes she tries to get [employer], but some stores don't carry it so she will get regular Mt Dew. Makes tea w/ sweet and low. Occasionally drinking V8 juice.  . Cardiovascular risk reduction- HFpEF, most recent EF 55-60% follows w/ Dr. Okey Dupre  o Current hypertensive regimen: amlodipine 5 mg QAM, atenolol 50 mg QAM, lisinopril 40 mg QAM; furosemide 40 mg BID- takes first dose around 7-7:30 am, second dose around 4-5 pm o Current hyperlipidemia regimen: atorvastatin 10 mg daily; last LDL at goal <70 . S/p COVID dx: reports that she continues to improve. Notes that she had a lingering cough, but this has improved. Some SOB, but had this at baseline. Previously needing 2.5-3 L O2 at night, but has now de-escalated to 2 L all day.   Pharmacist Clinical Goal(s):  Marland Kitchen Over the next 90 days, patient with work with PharmD and primary care provider to address optimized medication management  Interventions: . Comprehensive medication review performed, medication list updated in electronic medical record.  . Congratulated on generally well-controlled  sugar readings. Reviewed goal A1c, goal fasting sugar, and goal 2 hour post prandial sugar.  . Discussed impact of sugary drinks/snacks on sugars. Educated to choose diet sodas/juices if she wants those, but choosing water is best. Discussed switching to diet V8. May be a lack of understanding about difference between calories/carbohydrates, will collaborate w/ RN CM for continued dietary and lifestyle education support . Reviewed benefit of SGLT2 in ASCVD, possible benefit in HFpEF.  Marland Kitchen Reviewed upcoming appointments: cardiology, PCP, endocrinology.   Patient Self Care Activities:  . Patient will check blood glucose BID, document, and provide at future appointments . Patient will take medications as prescribed . Patient will report any questions or concerns to provider   Please see past updates related to this goal by clicking on the "Past Updates" button in the selected goal         Patient verbalizes understanding of instructions provided today.    Plan:  - Scheduled f/u call 07/14/19  Catie Feliz Beam, PharmD, [facility] Clinical Pharmacist [facility] [phone]

## 2019-04-16 NOTE — Chronic Care Management (AMB) (Signed)
Chronic Care Management   Follow Up Note   04/16/2019 Name: Sara Zhang MRN: [id number] DOB: 07/07/1970  Referred by: Venita Lick, NP Reason for referral : Chronic Care Management (Medication Management)   TANASHA MENEES is a 49 y.o. year old female who is a primary care patient of Cannady, Barbaraann Faster, NP. The CCM team was consulted for assistance with chronic disease management and care coordination needs.    Contacted patient for medication management review.   Review of patient status, including review of consultants reports, relevant laboratory and other test results, and collaboration with appropriate care team members and the patient's provider was performed as part of comprehensive patient evaluation and provision of chronic care management services.    SDOH (Social Determinants of Health) assessments performed: Yes See Care Plan activities for detailed interventions related to [facility])     Outpatient Encounter Medications as of 04/16/2019  Medication Sig Note  . amLODipine (NORVASC) 5 MG tablet Take 1 tablet (5 mg total) by mouth daily. 04/16/2019: QAM  . atenolol (TENORMIN) 50 MG tablet TAKE 1 TABLET BY MOUTH ONCE DAILY 04/16/2019: QAM  . atorvastatin (LIPITOR) 10 MG tablet Take 1 tablet (10 mg total) by mouth daily.   . blood glucose meter kit and supplies KIT Dispense based on patient and insurance preference. Use up to four times daily as directed. (FOR ICD-9 250.00, 250.01).   Marland Kitchen BYDUREON BCISE 2 MG/0.85ML AUIJ INJECT 2 MG UNDER THE SKIN EVERY WEEK   . Calcium Carbonate-Vitamin D (OYSTER SHELL CALCIUM 500 + D) 500-125 MG-UNIT TABS Take 1 tablet by mouth daily.    . Cholecalciferol (VITAMIN D3) 10 MCG (400 UNIT) tablet Take 400 Units by mouth daily.   . furosemide (LASIX) 40 MG tablet Take 1 tablet (40 mg total) by mouth 2 (two) times daily.   Marland Kitchen glucose blood (CONTOUR NEXT TEST) test strip TEST BLOOD SUGAR ONCE DAILY   . Insulin Glargine (TOUJEO SOLOSTAR) 300 UNIT/ML  SOPN Inject 54 Units into the skin at bedtime.   . Insulin Pen Needle 32G X 4 MM MISC 1 Units by Does not apply route every morning. Pen needles   . JARDIANCE 25 MG TABS tablet Take 25 mg by mouth daily.   Marland Kitchen levonorgestrel (MIRENA) 20 MCG/24HR IUD 1 each by Intrauterine route once.   Marland Kitchen lisinopril (ZESTRIL) 40 MG tablet TAKE 1 TABLET BY MOUTH EVERY DAY 04/16/2019: QAM  . OXYGEN Inhale 2 L into the lungs daily.   . [DISCONTINUED] acetaminophen (TYLENOL) 500 MG tablet Take 1,000 mg by mouth every 6 (six) hours as needed (for pain.).    No facility-administered encounter medications on file as of 04/16/2019.     Objective:   Goals Addressed            This Visit's Progress     Patient Stated   . PharmD "I have a lot of medications" (pt-stated)       CARE PLAN ENTRY (see longtitudinal plan of care for additional care plan information)  Current Barriers:  . Diabetes: uncontrolled, most recent A1c 7.1% . Current antihyperglycemic regimen: Toujeo 54 units, Bydureon 2 mg weekly, Jardiance 25 mg daily  o Follows w/ [facility] endocrinology; last appt 2/24, next in May o Continues on [place] as [place] Medicaid does not cover another weekly GLP1 (only Victoza) . Denies episodes of hypoglycemia since d/c glipizide. Notes that she is very happy about this change  . Current blood glucose readings o Fastings: all <130 o 2  hour post prandial: reports generally at goal <180, though occasionally elevated if she "cheats" and has sodas, candy . Current meal patterns:  o Drinks: notes she tries to get [employer], but some stores don't carry it so she will get regular Mt Dew. Makes tea w/ sweet and low. Occasionally drinking V8 juice.  . Cardiovascular risk reduction- HFpEF, most recent EF 55-60% follows w/ Dr. Saunders Revel  o Current hypertensive regimen: amlodipine 5 mg QAM, atenolol 50 mg QAM, lisinopril 40 mg QAM; furosemide 40 mg BID- takes first dose around 7-7:30 am, second dose around 4-5 pm o Current  hyperlipidemia regimen: atorvastatin 10 mg daily; last LDL at goal <70 . S/p COVID dx: reports that she continues to improve. Notes that she had a lingering cough, but this has improved. Some SOB, but had this at baseline. Previously needing 2.5-3 L O2 at night, but has now de-escalated to 2 L all day.   Pharmacist Clinical Goal(s):  Marland Kitchen Over the next 90 days, patient with work with PharmD and primary care provider to address optimized medication management  Interventions: . Comprehensive medication review performed, medication list updated in electronic medical record.  . Congratulated on generally well-controlled sugar readings. Reviewed goal A1c, goal fasting sugar, and goal 2 hour post prandial sugar.  . Discussed impact of sugary drinks/snacks on sugars. Educated to choose diet sodas/juices if she wants those, but choosing water is best. Discussed switching to diet V8. May be a lack of understanding about difference between calories/carbohydrates, will collaborate w/ RN CM for continued dietary and lifestyle education support . Reviewed benefit of SGLT2 in ASCVD, possible benefit in HFpEF.  Marland Kitchen Reviewed upcoming appointments: cardiology, PCP, endocrinology.   Patient Self Care Activities:  . Patient will check blood glucose BID, document, and provide at future appointments . Patient will take medications as prescribed . Patient will report any questions or concerns to provider   Please see past updates related to this goal by clicking on the "Past Updates" button in the selected goal          Plan:  - Scheduled f/u call 07/14/19  Catie Darnelle Maffucci, PharmD, [place] [phone]

## 2019-04-21 ENCOUNTER — Telehealth: Payer: Self-pay

## 2019-04-21 ENCOUNTER — Ambulatory Visit: Payer: Self-pay | Admitting: General Practice

## 2019-04-21 NOTE — Chronic Care Management (AMB) (Signed)
  Chronic Care Management   Outreach Note  04/21/2019 Name: Sara Zhang MRN: [id number] DOB: Jul 19, 1970  Referred by: Marjie Skiff, NP Reason for referral : Chronic Care Management (RNCM Chronic Disease Management and Care coordination needs)   An unsuccessful telephone outreach was attempted today. The patient was referred to the case management team for assistance with care management and care coordination.   Follow Up Plan: A HIPPA compliant phone message was left for the patient providing contact information and requesting a return call.   Alto Denver RN, MSN, CCM Community Care Coordinator [place]  [facility] Mobile: [phone]

## 2019-04-23 ENCOUNTER — Telehealth: Payer: Self-pay

## 2019-04-28 ENCOUNTER — Encounter: Payer: Self-pay | Admitting: Internal Medicine

## 2019-04-28 ENCOUNTER — Other Ambulatory Visit: Payer: Self-pay

## 2019-04-28 ENCOUNTER — Ambulatory Visit (INDEPENDENT_AMBULATORY_CARE_PROVIDER_SITE_OTHER): Payer: Medicare Other | Admitting: Internal Medicine

## 2019-04-28 ENCOUNTER — Other Ambulatory Visit: Payer: Self-pay | Admitting: Internal Medicine

## 2019-04-28 VITALS — BP 140/80 | HR 81 | Ht 68.0 in | Wt 368.2 lb

## 2019-04-28 DIAGNOSIS — I428 Other cardiomyopathies: Secondary | ICD-10-CM

## 2019-04-28 DIAGNOSIS — I10 Essential (primary) hypertension: Secondary | ICD-10-CM

## 2019-04-28 DIAGNOSIS — I5032 Chronic diastolic (congestive) heart failure: Secondary | ICD-10-CM | POA: Diagnosis not present

## 2019-04-28 NOTE — Patient Instructions (Addendum)
Medication Instructions:  Your physician recommends that you continue on your current medications as directed. Please refer to the Current Medication list given to you today.  *If you need a refill on your cardiac medications before your next appointment, please call your pharmacy*  Follow-Up: At [facility], you and your health needs are our priority.  As part of our continuing mission to provide you with exceptional heart care, we have created designated Provider Care Teams.  These Care Teams include your primary Cardiologist (physician) and Advanced Practice Providers (APPs -  Physician Assistants and Nurse Practitioners) who all work together to provide you with the care you need, when you need it.  We recommend signing up for the patient portal called "MyChart".  Sign up information is provided on this After Visit Summary.  MyChart is used to connect with patients for Virtual Visits (Telemedicine).  Patients are able to view lab/test results, encounter notes, upcoming appointments, etc.  Non-urgent messages can be sent to your provider as well.   To learn more about what you can do with MyChart, go to [email or web address].    Your next appointment:   6 month(s)  The format for your next appointment:   In Person  Provider:    You may see Yvonne Kendall, MD or one of the following Advanced Practice Providers on your designated Care Team:    Nicolasa Ducking, NP  Eula Listen, PA-C  Marisue Ivan, PA-C    Other Instructions Low Sodium Diet handout provided.

## 2019-04-28 NOTE — Progress Notes (Signed)
Follow-up Outpatient Visit Date: 04/28/2019  Primary Care Provider: Venita Lick, NP [place] [address]  Chief Complaint: Follow-up cardiomyopathy and hypertension  HPI:  Sara Zhang is a 49 y.o. female with history of heart failure (LVEF reportedly 25-35% in 2014 with subsequent normalization), HTN, HLD,OSA on CPAP, COVID-19 in 01/2019,and type II DM, who presents for follow-up of heart failure.  I last saw her in December, which time Sara Zhang was feeling well with improved dyspnea and leg edema.  She was using furosemide twice daily most days.  She was also on intermittent supplemental oxygen.  Blood pressure was suboptimally controlled, which led to addition of amlodipine 5 mg daily.  Sara Zhang reports that she has been doing relatively well.  She noted some leg swelling after eating pork skins recently.  However, swelling is improving.  She denies chest pain, shortness of breath, palpitations, and lightheadedness.  She is tolerating her medications well, including recent addition of amlodipine.  She notes that her blood pressure is typically better controlled at home attributes today's elevated pressure to aforementioned pork skins.  She was hospitalized shortly after Christmas due to COVID-19 infection.  She has recovered and is almost back to her baseline.  --------------------------------------------------------------------------------------------------  Cardiovascular History & Procedures: Cardiovascular Problems:  Chronic systolic heart failure with normalization of LVEF  Risk Factors:  Hypertension, hyperlipidemia, diabetes mellitus, and obesity  Cath/PCI:  None  CV Surgery:  None  EP Procedures and Devices:  None  Non-Invasive Evaluation(s):  TTE (11/23/2018): Normal LV size with mild LVH.  LVEF 55-60% with normal diastolic function.  Normal RV size and function.  Mild biatrial enlargement.  No significant valvular  abnormality.  TTE (06/10/16): Normal LV size and function with LVEF of 55-60%. Normal diastolic parameters. Mild MR. Normal RV size and function. Normal pulmonary artery pressure.  Report of LVEF 25-35% with global hypokinesis by outside echo on her before 03/04/12.  Recent CV Pertinent Labs: Lab Results  Component Value Date   CHOL 100 04/01/2019   HDL 38 (L) 08/20/2017   LDLCALC 52 08/20/2017   TRIG 66 04/01/2019   BNP 27.9 02/03/2019   K 4.5 04/01/2019   K 4.2 09/25/2013   MG 2.1 02/07/2019   BUN 15 04/01/2019   BUN 12 09/25/2013   CREATININE 1.16 (H) 04/01/2019   CREATININE 0.75 09/25/2013    Past medical and surgical history were reviewed and updated in EPIC.  Current Meds  Medication Sig  . amLODipine (NORVASC) 5 MG tablet Take 1 tablet (5 mg total) by mouth daily.  Marland Kitchen atenolol (TENORMIN) 50 MG tablet TAKE 1 TABLET BY MOUTH ONCE DAILY  . atorvastatin (LIPITOR) 10 MG tablet Take 1 tablet (10 mg total) by mouth daily.  . blood glucose meter kit and supplies KIT Dispense based on patient and insurance preference. Use up to four times daily as directed. (FOR ICD-9 250.00, 250.01).  Marland Kitchen BYDUREON BCISE 2 MG/0.85ML AUIJ INJECT 2 MG UNDER THE SKIN EVERY WEEK  . Calcium Carbonate-Vitamin D (OYSTER SHELL CALCIUM 500 + D) 500-125 MG-UNIT TABS Take 1 tablet by mouth daily.   . Cholecalciferol (VITAMIN D3) 10 MCG (400 UNIT) tablet Take 400 Units by mouth daily.  . furosemide (LASIX) 40 MG tablet Take 1 tablet (40 mg total) by mouth 2 (two) times daily.  Marland Kitchen glucose blood (CONTOUR NEXT TEST) test strip TEST BLOOD SUGAR ONCE DAILY  . Insulin Glargine (TOUJEO SOLOSTAR) 300 UNIT/ML SOPN Inject 54 Units into the skin  at bedtime.  . Insulin Pen Needle 32G X 4 MM MISC 1 Units by Does not apply route every morning. Pen needles  . JARDIANCE 25 MG TABS tablet Take 25 mg by mouth daily.  Marland Kitchen levonorgestrel (MIRENA) 20 MCG/24HR IUD 1 each by Intrauterine route once.  . OXYGEN Inhale 2 L into the lungs  daily.  . [DISCONTINUED] lisinopril (ZESTRIL) 40 MG tablet TAKE 1 TABLET BY MOUTH EVERY DAY    Allergies: Patient has no known allergies.  Social History   Tobacco Use  . Smoking status: Never Smoker  . Smokeless tobacco: Never Used  Substance Use Topics  . Alcohol use: Yes    Alcohol/week: 0.0 standard drinks    Comment: 1-2 beers/month  . Drug use: No    Family History  Problem Relation Age of Onset  . Diabetes Mother   . Hyperlipidemia Mother   . Hypertension Mother   . Stroke Mother   . Arthritis Father   . Asthma Father   . Diabetes Father   . Hypertension Father   . Hypertension Brother   . Diabetes Brother   . Diabetes Maternal Grandmother   . Hypertension Maternal Grandmother   . Diabetes Maternal Grandfather   . Hypertension Maternal Grandfather   . Diabetes Paternal Grandmother   . Hypertension Paternal Grandmother   . Heart attack Maternal Uncle   . Breast cancer Neg Hx     Review of Systems: A 12-system review of systems was performed and was negative except as noted in the HPI.  --------------------------------------------------------------------------------------------------  Physical Exam: BP 140/80 (BP Location: Left Arm, Patient Position: Sitting, Cuff Size: Large)   Pulse 81   Ht 5' 8"  (1.727 m)   Wt (!) 368 lb 4 oz (167 kg)   SpO2 95%   BMI 55.99 kg/m   General: NAD. Neck: No gross JVD, though body habitus limits evaluation. Lungs: Clear to auscultation bilaterally no wheeze or crackles. Heart: Distant heart sounds.  Regular rate and rhythm without murmurs. Abdomen: Obese but soft. Extremities: Trace pretibial edema.  EKG: Normal sinus rhythm with first-degree AV block (PR interval 230 ms) and low voltage.  Otherwise, no significant abnormality.  From 01/20/2019.  Lab Results  Component Value Date   WBC 8.4 04/01/2019   HGB 11.2 04/01/2019   HCT 37.1 04/01/2019   MCV 84 04/01/2019   PLT 362 04/01/2019    Lab Results   Component Value Date   NA 140 04/01/2019   K 4.5 04/01/2019   CL 99 04/01/2019   CO2 25 04/01/2019   BUN 15 04/01/2019   CREATININE 1.16 (H) 04/01/2019   GLUCOSE 188 (H) 04/01/2019   ALT 7 04/01/2019    Lab Results  Component Value Date   CHOL 100 04/01/2019   HDL 38 (L) 08/20/2017   LDLCALC 52 08/20/2017   TRIG 66 04/01/2019    --------------------------------------------------------------------------------------------------  ASSESSMENT AND PLAN: Chronic heart failure with recovered ejection fraction due to nonischemic cardiomyopathy: Sara Zhang appears grossly euvolemic on exam, though assessment of volume status is difficult due to her body habitus.  I think is reasonable to continue her current medications.  I encouraged her to minimize her sodium intake and to avoid high sodium foods such as pork skins.  Hypertension: Blood pressure mildly elevated today but typically better at home.  I suspect dietary indiscretion may be contributing to today's elevated blood pressure.  We will defer medication changes today.  Morbid obesity: Unfortunately, Sara Zhang has not lost significant weight since  our last visit, with a BMI still near 56.  I encouraged her to lose weight through diet and exercise.  Follow-up: Return to clinic in 6 months.  Nelva Bush, MD 04/28/2019 10:41 PM

## 2019-06-01 ENCOUNTER — Other Ambulatory Visit: Payer: Self-pay

## 2019-06-01 MED ORDER — FUROSEMIDE 40 MG PO TABS: 40.0000 mg | ORAL_TABLET | Freq: Two times a day (BID) | ORAL | 3 refills | Status: DC

## 2019-06-05 ENCOUNTER — Other Ambulatory Visit: Payer: Self-pay | Admitting: Nurse Practitioner

## 2019-06-05 NOTE — Telephone Encounter (Signed)
Requested Prescriptions  Pending Prescriptions Disp Refills  . atorvastatin (LIPITOR) 10 MG tablet [Pharmacy Med Name: ATORVASTATIN 10MG  TABLETS] 90 tablet 2    Sig: TAKE 1 TABLET(10 MG) BY MOUTH DAILY     Cardiovascular:  Antilipid - Statins Failed - 06/05/2019 10:09 AM      Failed - HDL in normal range and within 360 days    HDL  Date Value Ref Range Status  08/20/2017 38 (L) >39 mg/dL Final         Passed - Total Cholesterol in normal range and within 360 days    Cholesterol Piccolo, Waived  Date Value Ref Range Status  04/01/2019 100 <200 mg/dL Final    Comment:                            Desirable                <200                         Borderline High      200- 239                         High                     >239          Passed - LDL in normal range and within 360 days    LDL Calculated  Date Value Ref Range Status  08/20/2017 52 0 - 99 mg/dL Final         Passed - Triglycerides in normal range and within 360 days    Triglycerides Piccolo,Waived  Date Value Ref Range Status  04/01/2019 66 <150 mg/dL Final    Comment:                            Normal                   <150                         Borderline High     150 - 199                         High                200 - 499                         Very High                >499          Passed - Patient is not pregnant      Passed - Valid encounter within last 12 months    Recent Outpatient Visits          3 months ago Acute hypoxemic respiratory failure due to severe acute respiratory syndrome coronavirus 2 (SARS-CoV-2) disease (HCC)   [facility] Cannady, Jolene T, NP   3 months ago Acute hypoxemic respiratory failure due to severe acute respiratory syndrome coronavirus 2 (SARS-CoV-2) disease (HCC)   [facility] [place], Jolene T, NP   6 months ago Type 2 diabetes mellitus treated with insulin (HCC)  [facility], [place] T, NP   9 months ago  Insulin dependent diabetes mellitus ([place])   [facility] [place], Henrine Screws T, NP   1 year ago Insulin dependent diabetes mellitus ([place])   [place], Barbaraann Faster, NP      Future Appointments            In 1 week Cannady, Barbaraann Faster, NP [employer], [place]   In 4 months [place], Harrell Gave, MD [id number]

## 2019-06-15 ENCOUNTER — Encounter: Payer: Self-pay | Admitting: Nurse Practitioner

## 2019-06-15 ENCOUNTER — Other Ambulatory Visit: Payer: Self-pay

## 2019-06-15 ENCOUNTER — Ambulatory Visit (INDEPENDENT_AMBULATORY_CARE_PROVIDER_SITE_OTHER): Payer: Medicare Other | Admitting: Nurse Practitioner

## 2019-06-15 VITALS — BP 130/78 | HR 91 | Temp 98.5°F | Wt 368.0 lb

## 2019-06-15 DIAGNOSIS — E1129 Type 2 diabetes mellitus with other diabetic kidney complication: Secondary | ICD-10-CM | POA: Diagnosis not present

## 2019-06-15 DIAGNOSIS — Z794 Long term (current) use of insulin: Secondary | ICD-10-CM

## 2019-06-15 DIAGNOSIS — E119 Type 2 diabetes mellitus without complications: Secondary | ICD-10-CM | POA: Diagnosis not present

## 2019-06-15 DIAGNOSIS — N182 Chronic kidney disease, stage 2 (mild): Secondary | ICD-10-CM

## 2019-06-15 DIAGNOSIS — E785 Hyperlipidemia, unspecified: Secondary | ICD-10-CM | POA: Diagnosis not present

## 2019-06-15 DIAGNOSIS — G4733 Obstructive sleep apnea (adult) (pediatric): Secondary | ICD-10-CM

## 2019-06-15 DIAGNOSIS — Z8616 Personal history of COVID-19: Secondary | ICD-10-CM | POA: Insufficient documentation

## 2019-06-15 DIAGNOSIS — R809 Proteinuria, unspecified: Secondary | ICD-10-CM | POA: Insufficient documentation

## 2019-06-15 DIAGNOSIS — E1169 Type 2 diabetes mellitus with other specified complication: Secondary | ICD-10-CM

## 2019-06-15 DIAGNOSIS — I5032 Chronic diastolic (congestive) heart failure: Secondary | ICD-10-CM | POA: Diagnosis not present

## 2019-06-15 DIAGNOSIS — I130 Hypertensive heart and chronic kidney disease with heart failure and stage 1 through stage 4 chronic kidney disease, or unspecified chronic kidney disease: Secondary | ICD-10-CM

## 2019-06-15 LAB — BAYER DCA HB A1C WAIVED: HB A1C (BAYER DCA - WAIVED): 6.8 % (ref <7.0)

## 2019-06-15 MED ORDER — ATENOLOL 50 MG PO TABS: 50.0000 mg | ORAL_TABLET | Freq: Every day | ORAL | 4 refills | Status: DC

## 2019-06-15 NOTE — Assessment & Plan Note (Signed)
Chronic, stable.  Recent labs continue to show stable kidney function with no decline.  Continue current medication regimen and adjust as needed.  BMP today.

## 2019-06-15 NOTE — Assessment & Plan Note (Signed)
Improving.  Continue to monitor O2 sat at home, is now off quarantine period.  If worsening SOB or difficulty breathing is aware to immediately go to ER.  BMP today.  Recent repeat imaging 03/16/19 showed improved PNA "Minimal left basilar subsegmental atelectasis or scarring".

## 2019-06-15 NOTE — Assessment & Plan Note (Signed)
Chronic, ongoing with BP initially elevated and repeat at goal.  Continue current medication regimen and collaboration with cardiology.  Recommend checking BP three mornings a week at home + documenting for providers.  BMP today.  Recommend DASH diet and avoidance of high sodium foods.  Return in 3 months.

## 2019-06-15 NOTE — Progress Notes (Signed)
BP 130/78 (BP Location: Left Arm)   Pulse 91   Temp 98.5 F (36.9 C) (Oral)   Wt (!) 368 lb (166.9 kg)   SpO2 97%   BMI 55.95 kg/m    Subjective:    Patient ID: Sara Zhang, female    DOB: [birth year], 49 y.o.   MRN: [id number]  HPI: Sara Zhang is a 49 y.o. female  Chief Complaint  Patient presents with  . Diabetes  . Hypertension   DIABETES Current medications include Jardiance 25 MG, Toujeo 56 units QHS, and Bydureon 2 MG weekly.A1C in December 7.1% and today A1C 6.8%. She is followed by endocrinology and her last visit with them was 03/31/19, patient reports missing labs for this.  Reports change in diet since Covid and higher readings since being sick with Covid. States she had some 500's in the hospital when getting medication, but these have improved since being home.   Polydipsia/polyuria: no Visual disturbance: no Chest pain: no Paresthesias: no Glucose Monitoring: yes Accucheck frequency: BID Fasting glucose: 88 to 111 Evening: 158 to 211 -- endorses snacking in evening Before meals: Taking Insulin?: yes Long acting insulin: 56 units Short acting insulin: Blood Pressure Monitoring: not checking Retinal Examination: Up to Date Foot Exam: Up to Date Pneumovax: Up to Date Influenza: Up to Date Aspirin: no  HYPERTENSION / HYPERLIPIDEMIA/HF Current medications include Lisinopril, Furosemide, Atenolol for HTN + Lipitor for HLD. Followed by cardiology and last saw Dr. End3/24/21, no changes made.Is working on diet and trying to decrease her salty foods and pork skins -- she continues to report eating pork skins for snacks in evening is an issue -- eats these about 2 evenings. Satisfied with current treatment? yes Duration of hypertension: chronic BP monitoring frequency: not checking BP range:  BP medication side effects: no Duration of hyperlipidemia: chronic Cholesterol  medication side effects: no Cholesterol supplements: none Medication compliance: good compliance Aspirin: no Recent stressors: no Recurrent headaches:no Visual changes: no Palpitations: no Dyspnea: occasional, baseline at this time Chest pain: no Lower extremity edema: occasional -- when eats salty foods Dizzy/lightheaded: no  COVIDPNA: At this time O2 sats 92-95% on 2L during day and 2L at night and uses CPAP at night. At this time she does endorse feeling better, about 100% better.   Fever:no Cough:none Shortness of breath:at baseline now -- no worsening Wheezing:none Chest pain:no Chest tightness:no Chest congestion:no Nasal congestion:no Runny nose:no Post nasal drip:no Sneezing:no Sore throat:no Swollen glands:no Sinus pressure:no Headache:no Face pain:no Toothache:no Ear pain:none Ear pressure:none Eyes red/itching:no Eye drainage/crusting:no Vomiting:none Rash:no Fatigue:occasional  Relevant past medical, surgical, family and social history reviewed and updated as indicated. Interim medical history since our last visit reviewed. Allergies and medications reviewed and updated.  Review of Systems  Constitutional: Negative for activity change, appetite change, diaphoresis, fatigue and fever.  Respiratory: Positive for shortness of breath (at baseline, no worse). Negative for cough, chest tightness and wheezing.   Cardiovascular: Positive for leg swelling (at baseline, no worse). Negative for chest pain and palpitations.  Gastrointestinal: Negative.   Endocrine: Negative for polydipsia, polyphagia and polyuria.  Neurological: Negative.   Psychiatric/Behavioral: Negative.     Per HPI unless specifically indicated above     Objective:    BP 130/78 (BP Location: Left Arm)   Pulse 91   Temp 98.5 F (36.9 C) (Oral)   Wt (!) 368 lb (166.9 kg)   SpO2 97%   BMI 55.95 kg/m   Wt Readings from Last 3  Encounters:  06/15/19 (!) 368  lb (166.9 kg)  04/28/19 (!) 368 lb 4 oz (167 kg)  02/03/19 (!) 372 lb 9.2 oz (169 kg)    Physical Exam Vitals and nursing note reviewed.  Constitutional:      General: She is awake. She is not in acute distress.    Appearance: She is well-developed. She is morbidly obese. She is not ill-appearing.  HENT:     Head: Normocephalic.     Right Ear: Hearing normal.     Left Ear: Hearing normal.  Eyes:     General: Lids are normal.        Right eye: No discharge.        Left eye: No discharge.     Conjunctiva/sclera: Conjunctivae normal.     Pupils: Pupils are equal, round, and reactive to light.  Neck:     Thyroid: No thyromegaly.     Vascular: No carotid bruit.  Cardiovascular:     Rate and Rhythm: Normal rate and regular rhythm.     Heart sounds: Normal heart sounds. No murmur. No gallop.   Pulmonary:     Effort: Pulmonary effort is normal. No accessory muscle usage or respiratory distress.     Breath sounds: Normal breath sounds.     Comments: O2 on via [place] at 2L.  Lungs clear throughout. Abdominal:     General: Bowel sounds are normal.     Palpations: Abdomen is soft. There is no hepatomegaly or splenomegaly.  Musculoskeletal:     Cervical back: Normal range of motion and neck supple.     Right lower leg: Edema (trace) present.     Left lower leg: Edema (trace) present.  Skin:    General: Skin is warm and dry.  Neurological:     Mental Status: She is alert and oriented to person, place, and time.  Psychiatric:        Attention and Perception: Attention normal.        Mood and Affect: Mood normal.        Speech: Speech normal.        Behavior: Behavior normal. Behavior is cooperative.        Thought Content: Thought content normal.     Results for orders placed or performed in visit on 04/01/19  TSH  Result Value Ref Range   TSH 1.470 0.450 - 4.500 uIU/mL  VITAMIN D 25 Hydroxy (Vit-D Deficiency, Fractures)  Result Value Ref Range   Vit D, 25-Hydroxy 38.6 30.0 -  100.0 ng/mL  CBC with Differential/Platelet  Result Value Ref Range   WBC 8.4 3.4 - 10.8 x10E3/uL   RBC 4.41 3.77 - 5.28 x10E6/uL   Hemoglobin 11.2 11.1 - 15.9 g/dL   Hematocrit 30.0 92.3 - 46.6 %   MCV 84 79 - 97 fL   MCH 25.4 (L) 26.6 - 33.0 pg   MCHC 30.2 (L) 31.5 - 35.7 g/dL   RDW 30.0 76.2 - 26.3 %   Platelets 362 150 - 450 x10E3/uL   Neutrophils 69 Not Estab. %   Lymphs 18 Not Estab. %   Monocytes 8 Not Estab. %   Eos 3 Not Estab. %   Basos 1 Not Estab. %   Neutrophils Absolute 6.0 1.4 - 7.0 x10E3/uL   Lymphocytes Absolute 1.5 0.7 - 3.1 x10E3/uL   Monocytes Absolute 0.6 0.1 - 0.9 x10E3/uL   EOS (ABSOLUTE) 0.2 0.0 - 0.4 x10E3/uL   Basophils Absolute 0.0 0.0 - 0.2 x10E3/uL   Immature  Granulocytes 1 Not Estab. %   Immature Grans (Abs) 0.1 0.0 - 0.1 x10E3/uL  Microalbumin, Urine Waived  Result Value Ref Range   Microalb, Ur Waived 80 (H) 0 - 19 mg/L   Creatinine, Urine Waived 100 10 - 300 mg/dL   Microalb/Creat Ratio 30-300 (H) <30 mg/g  Lipid Panel Piccolo, Waived  Result Value Ref Range   Cholesterol Piccolo, Waived 100 <200 mg/dL   HDL Chol Piccolo, Waived 36 (L) >59 mg/dL   Triglycerides Piccolo,Waived 66 <150 mg/dL   Chol/HDL Ratio Piccolo,Waive 2.8 mg/dL   LDL Chol Calc Piccolo Waived 51 <100 mg/dL   VLDL Chol Calc Piccolo,Waive 13 <30 mg/dL  Comprehensive metabolic panel  Result Value Ref Range   Glucose 188 (H) 65 - 99 mg/dL   BUN 15 6 - 24 mg/dL   Creatinine, Ser 1.16 (H) 0.57 - 1.00 mg/dL   GFR calc non Af Amer 55 (L) >59 mL/min/1.73   GFR calc Af Amer 63 >59 mL/min/1.73   BUN/Creatinine Ratio 13 9 - 23   Sodium 140 134 - 144 mmol/L   Potassium 4.5 3.5 - 5.2 mmol/L   Chloride 99 96 - 106 mmol/L   CO2 25 20 - 29 mmol/L   Calcium 8.3 (L) 8.7 - 10.2 mg/dL   Total Protein 6.6 6.0 - 8.5 g/dL   Albumin 3.6 (L) 3.8 - 4.8 g/dL   Globulin, Total 3.0 1.5 - 4.5 g/dL   Albumin/Globulin Ratio 1.2 1.2 - 2.2   Bilirubin Total 0.3 0.0 - 1.2 mg/dL   Alkaline  Phosphatase 104 39 - 117 IU/L   AST 12 0 - 40 IU/L   ALT 7 0 - 32 IU/L      Assessment & Plan:   Problem List Items Addressed This Visit      Cardiovascular and Mediastinum   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  Continue current medication regimen and collaboration with cardiology.  Recommend checking BP three mornings a week at home + documenting for providers.  BMP today.  Recommend DASH diet and avoidance of high sodium foods.  Return in 3 months.      Relevant Medications   atenolol (TENORMIN) 50 MG tablet   Other Relevant Orders   Basic metabolic panel   Chronic heart failure with preserved ejection fraction (HCC)    Chronic, ongoing with BP at goal.  Continue current medication regimen and collaboration with cardiology.  Recommend: - Reminded to call for an overnight weight gain of >2 pounds or a weekly weight weight of >5 pounds - not adding salt to his food and has been reading food labels. Reviewed the importance of keeping daily sodium intake to 2000mg  daily -- NO PORK SKINS      Relevant Medications   atenolol (TENORMIN) 50 MG tablet     Respiratory   Obstructive sleep apnea    Continue consistent use of CPAP every night.        Endocrine   Hyperlipidemia associated with type 2 diabetes mellitus ([place])    Chronic, ongoing with recent LDL 51 and TCHOL 100 at recent visit.  Continue current medication regimen as is at goal.  Lipid panel next visit.      Type 2 diabetes mellitus treated with insulin (HCC) - Primary    Chronic, ongoing.  Followed by endo.  A1C today 6.8%, below goal.  Continue collaboration  with endocrinology.  Continue current medication regimen and adjust as needed.  Recommend she continue to monitor BS at home 2-3 times a day.  Return in 3 months.       Relevant Orders   Bayer DCA Hb A1c Waived     Genitourinary   CKD  (chronic kidney disease), stage II    Chronic, stable.  Recent labs continue to show stable kidney function with no decline.  Continue current medication regimen and adjust as needed.  BMP today.        Other   Morbid obesity (HCC)    Recommended eating smaller high protein, low fat meals more frequently and exercising 30 mins a day 5 times a week with a goal of 10-15lb weight loss in the next 3 months. Patient voiced their understanding and motivation to adhere to these recommendations.           Follow up plan: Return in about 3 months (around 09/15/2019) for T2DM, HTN/HLD, HF, Vit D.

## 2019-06-15 NOTE — Patient Instructions (Signed)

## 2019-06-15 NOTE — Assessment & Plan Note (Signed)
Chronic, ongoing.  Followed by endo.  A1C today 6.8%, below goal.  Continue collaboration with endocrinology.  Continue current medication regimen and adjust as needed.  Recommend she continue to monitor BS at home 2-3 times a day.  Return in 3 months.

## 2019-06-15 NOTE — Assessment & Plan Note (Signed)
Chronic, ongoing with BP at goal.  Continue current medication regimen and collaboration with cardiology.  Recommend: - Reminded to call for an overnight weight gain of >2 pounds or a weekly weight weight of >5 pounds - not adding salt to his food and has been reading food labels. Reviewed the importance of keeping daily sodium intake to 2000mg  daily -- NO PORK SKINS

## 2019-06-15 NOTE — Assessment & Plan Note (Signed)
Continue consistent use of CPAP every night. 

## 2019-06-15 NOTE — Assessment & Plan Note (Signed)
Recommended eating smaller high protein, low fat meals more frequently and exercising 30 mins a day 5 times a week with a goal of 10-15lb weight loss in the next 3 months. Patient voiced their understanding and motivation to adhere to these recommendations.  

## 2019-06-15 NOTE — Assessment & Plan Note (Signed)
Chronic, ongoing with recent LDL 51 and TCHOL 100 at recent visit.  Continue current medication regimen as is at goal.  Lipid panel next visit.

## 2019-06-16 LAB — BASIC METABOLIC PANEL
BUN/Creatinine Ratio: 13 (ref 9–23)
BUN: 15 mg/dL (ref 6–24)
CO2: 25 mmol/L (ref 20–29)
Calcium: 8.3 mg/dL — ABNORMAL LOW (ref 8.7–10.2)
Chloride: 106 mmol/L (ref 96–106)
Creatinine, Ser: 1.13 mg/dL — ABNORMAL HIGH (ref 0.57–1.00)
GFR calc Af Amer: 65 mL/min/{1.73_m2} (ref >59)
GFR calc non Af Amer: 57 mL/min/{1.73_m2} — ABNORMAL LOW (ref >59)
Glucose: 109 mg/dL — ABNORMAL HIGH (ref 65–99)
Potassium: 4.6 mmol/L (ref 3.5–5.2)
Sodium: 144 mmol/L (ref 134–144)

## 2019-06-16 NOTE — Progress Notes (Signed)
Contacted via MyChartGood morning Ciera.  Your kidney function and electrolytes have returned.  Kidney function continues to show some elevation in creatinine and normal GFR for you.  I would recommend drinking more water and decreasing salt intake at home + take in a little calcium daily.  Have a great day!! Keep being awesome!! Kindest regards, Que Meneely

## 2019-07-07 DIAGNOSIS — E1169 Type 2 diabetes mellitus with other specified complication: Secondary | ICD-10-CM | POA: Diagnosis not present

## 2019-07-07 DIAGNOSIS — Z6841 Body Mass Index (BMI) 40.0 and over, adult: Secondary | ICD-10-CM | POA: Diagnosis not present

## 2019-07-07 DIAGNOSIS — Z794 Long term (current) use of insulin: Secondary | ICD-10-CM | POA: Diagnosis not present

## 2019-07-07 DIAGNOSIS — I152 Hypertension secondary to endocrine disorders: Secondary | ICD-10-CM | POA: Diagnosis not present

## 2019-07-07 DIAGNOSIS — E1159 Type 2 diabetes mellitus with other circulatory complications: Secondary | ICD-10-CM | POA: Diagnosis not present

## 2019-07-07 DIAGNOSIS — E785 Hyperlipidemia, unspecified: Secondary | ICD-10-CM | POA: Diagnosis not present

## 2019-07-07 DIAGNOSIS — E1129 Type 2 diabetes mellitus with other diabetic kidney complication: Secondary | ICD-10-CM | POA: Diagnosis not present

## 2019-07-07 DIAGNOSIS — R809 Proteinuria, unspecified: Secondary | ICD-10-CM | POA: Diagnosis not present

## 2019-07-07 DIAGNOSIS — E559 Vitamin D deficiency, unspecified: Secondary | ICD-10-CM | POA: Diagnosis not present

## 2019-07-14 ENCOUNTER — Other Ambulatory Visit: Payer: Self-pay | Admitting: *Deleted

## 2019-07-14 ENCOUNTER — Ambulatory Visit (INDEPENDENT_AMBULATORY_CARE_PROVIDER_SITE_OTHER): Payer: Medicare Other | Admitting: Pharmacist

## 2019-07-14 DIAGNOSIS — E119 Type 2 diabetes mellitus without complications: Secondary | ICD-10-CM | POA: Diagnosis not present

## 2019-07-14 DIAGNOSIS — N182 Chronic kidney disease, stage 2 (mild): Secondary | ICD-10-CM

## 2019-07-14 DIAGNOSIS — I130 Hypertensive heart and chronic kidney disease with heart failure and stage 1 through stage 4 chronic kidney disease, or unspecified chronic kidney disease: Secondary | ICD-10-CM

## 2019-07-14 DIAGNOSIS — Z794 Long term (current) use of insulin: Secondary | ICD-10-CM | POA: Diagnosis not present

## 2019-07-14 MED ORDER — AMLODIPINE BESYLATE 5 MG PO TABS: 5.0000 mg | ORAL_TABLET | Freq: Every day | ORAL | 3 refills | Status: DC

## 2019-07-14 NOTE — Chronic Care Management (AMB) (Signed)
Chronic Care Management   Follow Up Note   07/14/2019 Name: Sara Zhang MRN: [id number] DOB: [birth year]  Referred by: Venita Lick, NP Reason for referral : Chronic Care Management (Medication Management)   Sara Zhang is a 49 y.o. year old female who is a primary care patient of Cannady, Barbaraann Faster, NP. The CCM team was consulted for assistance with chronic disease management and care coordination needs.    Contacted patient for medication management review.   Review of patient status, including review of consultants reports, relevant laboratory and other test results, and collaboration with appropriate care team members and the patient's provider was performed as part of comprehensive patient evaluation and provision of chronic care management services.    SDOH (Social Determinants of Health) assessments performed: Yes See Care Plan activities for detailed interventions related to [facility])     Outpatient Encounter Medications as of 07/14/2019  Medication Sig  . amLODipine (NORVASC) 5 MG tablet Take 1 tablet (5 mg total) by mouth daily.  Marland Kitchen atenolol (TENORMIN) 50 MG tablet Take 1 tablet (50 mg total) by mouth daily.  Marland Kitchen atorvastatin (LIPITOR) 10 MG tablet TAKE 1 TABLET(10 MG) BY MOUTH DAILY  . blood glucose meter kit and supplies KIT Dispense based on patient and insurance preference. Use up to four times daily as directed. (FOR ICD-9 250.00, 250.01).  Marland Kitchen BYDUREON BCISE 2 MG/0.85ML AUIJ INJECT 2 MG UNDER THE SKIN EVERY WEEK  . Calcium Carbonate-Vitamin D (OYSTER SHELL CALCIUM 500 + D) 500-125 MG-UNIT TABS Take 1 tablet by mouth daily.   . Cholecalciferol (VITAMIN D3) 10 MCG (400 UNIT) tablet Take 400 Units by mouth daily.  . furosemide (LASIX) 40 MG tablet Take 1 tablet (40 mg total) by mouth 2 (two) times daily.  Marland Kitchen glucose blood (CONTOUR NEXT TEST) test strip TEST BLOOD SUGAR ONCE DAILY  . Insulin Glargine (TOUJEO SOLOSTAR) 300 UNIT/ML SOPN Inject 54 Units into the skin at  bedtime.  . Insulin Pen Needle 32G X 4 MM MISC 1 Units by Does not apply route every morning. Pen needles  . JARDIANCE 25 MG TABS tablet Take 25 mg by mouth daily.  Marland Kitchen levonorgestrel (MIRENA) 20 MCG/24HR IUD 1 each by Intrauterine route once.  Marland Kitchen lisinopril (ZESTRIL) 40 MG tablet TAKE 1 TABLET BY MOUTH EVERY DAY  . OXYGEN Inhale 2 L into the lungs daily.   No facility-administered encounter medications on file as of 07/14/2019.     Objective:   Goals Addressed            This Visit's Progress     Patient Stated   . PharmD "I have a lot of medications" (pt-stated)       CARE PLAN ENTRY (see longtitudinal plan of care for additional care plan information)  Current Barriers:  . Diabetes: controlled, complicated by CHF, s/p COVID; most recent A1c 6.8% o Reports she is pleased w/ last A1c . Current antihyperglycemic regimen: Toujeo 54 units, Bydureon 2 mg weekly, Jardiance 25 mg daily  . Current meals patterns: o Drinks: now drinks [employer], coke zero; cut back on sweets and snacks;  o Notes that she hasn't eaten pork skins in 2 weeks . Cardiovascular risk reduction- HFpEF, most recent EF 55-60% follows w/ Dr. Saunders Revel; is not weighing at home because she "gets disappointed" when she looks at the scale o Current hypertensive regimen: amlodipine 5 mg QAM, atenolol 50 mg QAM, lisinopril 40 mg QAM; furosemide 40 mg BID- takes first dose around  7-7:30 am, second dose around 4-5 pm; current BP readings: 120-130s/80 on her dad's cuff when she occasionally uses it  o Current hyperlipidemia regimen: atorvastatin 10 mg daily; last LDL at goal <70 . S/p COVID dx: currently on 2L O2  Pharmacist Clinical Goal(s):  Marland Kitchen Over the next 90 days, patient with work with PharmD and primary care provider to address optimized medication management  Interventions: . Comprehensive medication review performed, medication list updated in electronic medical record . Inter-disciplinary care team collaboration (see  longitudinal plan of care) . Reviewed that [place] now covers Trulicity. Will encourage her to discuss w/ endocrinology at next appointment, as Trulicity may provide better weight loss benefit than Bydureon . Reviewed benefits of having BP machine for herself. Will collaborate w/ office staff to send prescription for BP machine to [facility] . Reviewed benefits of daily weight to evaluate fluid status.  . Praised for avoidance of pig skins and high sodium foods.   Patient Self Care Activities:  . Patient will check blood glucose BID, document, and provide at future appointments . Patient will take medications as prescribed . Patient will report any questions or concerns to provider   Please see past updates related to this goal by clicking on the "Past Updates" button in the selected goal          Plan:  - Scheduled f/u call in ~ 9 weeks  Catie Darnelle Maffucci, PharmD, [place] [phone]

## 2019-07-14 NOTE — Patient Instructions (Signed)
Visit Information  Goals Addressed            This Visit's Progress     Patient Stated   . PharmD "I have a lot of medications" (pt-stated)       CARE PLAN ENTRY (see longtitudinal plan of care for additional care plan information)  Current Barriers:  . Diabetes: controlled, complicated by CHF, s/p COVID; most recent A1c 6.8% o Reports she is pleased w/ last A1c . Current antihyperglycemic regimen: Toujeo 54 units, Bydureon 2 mg weekly, Jardiance 25 mg daily  . Current meals patterns: o Drinks: now drinks [employer], coke zero; cut back on sweets and snacks;  o Notes that she hasn't eaten pork skins in 2 weeks . Cardiovascular risk reduction- HFpEF, most recent EF 55-60% follows w/ Dr. Okey Dupre; is not weighing at home because she "gets disappointed" when she looks at the scale o Current hypertensive regimen: amlodipine 5 mg QAM, atenolol 50 mg QAM, lisinopril 40 mg QAM; furosemide 40 mg BID- takes first dose around 7-7:30 am, second dose around 4-5 pm; current BP readings: 120-130s/80 on her dad's cuff when she occasionally uses it  o Current hyperlipidemia regimen: atorvastatin 10 mg daily; last LDL at goal <70 . S/p COVID dx: currently on 2L O2  Pharmacist Clinical Goal(s):  Marland Kitchen Over the next 90 days, patient with work with PharmD and primary care provider to address optimized medication management  Interventions: . Comprehensive medication review performed, medication list updated in electronic medical record . Inter-disciplinary care team collaboration (see longitudinal plan of care) . Reviewed that  Medicaid now covers Trulicity. Will encourage her to discuss w/ endocrinology at next appointment, as Trulicity may provide better weight loss benefit than Bydureon . Reviewed benefits of having BP machine for herself. Will collaborate w/ office staff to send prescription for BP machine to [facility] . Reviewed benefits of daily weight to evaluate fluid status.  . Praised for  avoidance of pig skins and high sodium foods.   Patient Self Care Activities:  . Patient will check blood glucose BID, document, and provide at future appointments . Patient will take medications as prescribed . Patient will report any questions or concerns to provider   Please see past updates related to this goal by clicking on the "Past Updates" button in the selected goal         Patient verbalizes understanding of instructions provided today.   Plan:  - Scheduled f/u call in ~ 9 weeks  Catie Feliz Beam, PharmD, [facility] Clinical Pharmacist [facility] [phone]

## 2019-08-13 ENCOUNTER — Other Ambulatory Visit: Payer: Self-pay

## 2019-08-13 ENCOUNTER — Encounter: Payer: Self-pay | Admitting: Nurse Practitioner

## 2019-08-13 ENCOUNTER — Ambulatory Visit (INDEPENDENT_AMBULATORY_CARE_PROVIDER_SITE_OTHER): Payer: Medicare Other | Admitting: Nurse Practitioner

## 2019-08-13 VITALS — BP 106/69 | HR 80 | Temp 98.2°F | Wt 363.4 lb

## 2019-08-13 DIAGNOSIS — Z0289 Encounter for other administrative examinations: Secondary | ICD-10-CM | POA: Diagnosis not present

## 2019-08-13 NOTE — Progress Notes (Signed)
BP 106/69   Pulse 80   Temp 98.2 F (36.8 C) (Oral)   Wt (!) 363 lb 6.4 oz (164.8 kg)   SpO2 92%   BMI 55.25 kg/m    Subjective:    Patient ID: Sara Zhang, female    DOB: 12-Jun-1970, 49 y.o.   MRN: [id number]  HPI: EMMANUEL Zhang is a 49 y.o. female  Chief Complaint  Patient presents with  . Paperwork   FORMS TO ASSIST: Needs assist to fill out her disability forms and information.  No acute or chronic complaints today.  No signature required on forms, needed assist in reviewing health issues.  Relevant past medical, surgical, family and social history reviewed and updated as indicated. Interim medical history since our last visit reviewed. Allergies and medications reviewed and updated.  Review of Systems  Constitutional: Negative for activity change, appetite change, diaphoresis, fatigue and fever.  Respiratory: Negative for cough, chest tightness, shortness of breath and wheezing.   Cardiovascular: Negative for chest pain, palpitations and leg swelling.  Gastrointestinal: Negative.   Endocrine: Negative for polydipsia, polyphagia and polyuria.  Neurological: Negative.   Psychiatric/Behavioral: Negative.     Per HPI unless specifically indicated above     Objective:    BP 106/69   Pulse 80   Temp 98.2 F (36.8 C) (Oral)   Wt (!) 363 lb 6.4 oz (164.8 kg)   SpO2 92%   BMI 55.25 kg/m   Wt Readings from Last 3 Encounters:  08/13/19 (!) 363 lb 6.4 oz (164.8 kg)  06/15/19 (!) 368 lb (166.9 kg)  04/28/19 (!) 368 lb 4 oz (167 kg)    Physical Exam Vitals and nursing note reviewed.  Constitutional:      General: She is awake. She is not in acute distress.    Appearance: She is well-developed. She is morbidly obese. She is not ill-appearing.  HENT:     Head: Normocephalic.     Right Ear: Hearing normal.     Left Ear: Hearing normal.  Eyes:     General: Lids are normal.        Right eye: No discharge.        Left eye: No discharge.      Conjunctiva/sclera: Conjunctivae normal.     Pupils: Pupils are equal, round, and reactive to light.  Neck:     Thyroid: No thyromegaly.     Vascular: No carotid bruit.  Cardiovascular:     Rate and Rhythm: Normal rate and regular rhythm.     Heart sounds: Normal heart sounds. No murmur heard.  No gallop.   Pulmonary:     Effort: Pulmonary effort is normal. No accessory muscle usage or respiratory distress.     Breath sounds: Normal breath sounds.     Comments: Lungs clear throughout. Abdominal:     General: Bowel sounds are normal.     Palpations: Abdomen is soft. There is no hepatomegaly or splenomegaly.  Musculoskeletal:     Cervical back: Normal range of motion and neck supple.     Right lower leg: Edema (trace) present.     Left lower leg: Edema (trace) present.  Skin:    General: Skin is warm and dry.  Neurological:     Mental Status: She is alert and oriented to person, place, and time.  Psychiatric:        Attention and Perception: Attention normal.        Mood and Affect: Mood normal.  Speech: Speech normal.        Behavior: Behavior normal. Behavior is cooperative.        Thought Content: Thought content normal.     Results for orders placed or performed in visit on 06/15/19  Bayer DCA Hb A1c Waived  Result Value Ref Range   HB A1C (BAYER DCA - WAIVED) 6.8 <7.0 %  Basic metabolic panel  Result Value Ref Range   Glucose 109 (H) 65 - 99 mg/dL   BUN 15 6 - 24 mg/dL   Creatinine, Ser 0.88 (H) 0.57 - 1.00 mg/dL   GFR calc non Af Amer 57 (L) >59 mL/min/1.73   GFR calc Af Amer 65 >59 mL/min/1.73   BUN/Creatinine Ratio 13 9 - 23   Sodium 144 134 - 144 mmol/L   Potassium 4.6 3.5 - 5.2 mmol/L   Chloride 106 96 - 106 mmol/L   CO2 25 20 - 29 mmol/L   Calcium 8.3 (L) 8.7 - 10.2 mg/dL      Assessment & Plan:   Problem List Items Addressed This Visit    None    Visit Diagnoses    Encounter for completion of form with patient    -  Primary   Form completed  with patient at bedside, assisted her in reviewing and completing.      Time: 15 minutes, >50% spent reviewing and completing form with patient  Follow up plan: Return if symptoms worsen or fail to improve.

## 2019-08-13 NOTE — Patient Instructions (Signed)
COVID-19 COVID-19 is a respiratory infection that is caused by a virus called severe acute respiratory syndrome coronavirus 2 (SARS-CoV-2). The disease is also known as coronavirus disease or novel coronavirus. In some people, the virus may not cause any symptoms. In others, it may cause a serious infection. The infection can get worse quickly and can lead to complications, such as:  Pneumonia, or infection of the lungs.  Acute respiratory distress syndrome or ARDS. This is a condition in which fluid build-up in the lungs prevents the lungs from filling with air and passing oxygen into the blood.  Acute respiratory failure. This is a condition in which there is not enough oxygen passing from the lungs to the body or when carbon dioxide is not passing from the lungs out of the body.  Sepsis or septic shock. This is a serious bodily reaction to an infection.  Blood clotting problems.  Secondary infections due to bacteria or fungus.  Organ failure. This is when your body's organs stop working. The virus that causes COVID-19 is contagious. This means that it can spread from person to person through droplets from coughs and sneezes (respiratory secretions). What are the causes? This illness is caused by a virus. You may catch the virus by:  Breathing in droplets from an infected person. Droplets can be spread by a person breathing, speaking, singing, coughing, or sneezing.  Touching something, like a table or a doorknob, that was exposed to the virus (contaminated) and then touching your mouth, nose, or eyes. What increases the risk? Risk for infection You are more likely to be infected with this virus if you:  Are within 6 feet (2 meters) of a person with COVID-19.  Provide care for or live with a person who is infected with COVID-19.  Spend time in crowded indoor spaces or live in shared housing. Risk for serious illness You are more likely to become seriously ill from the virus if you:   Are 50 years of age or older. The higher your age, the more you are at risk for serious illness.  Live in a nursing home or long-term care facility.  Have cancer.  Have a long-term (chronic) disease such as: ? Chronic lung disease, including chronic obstructive pulmonary disease or asthma. ? A long-term disease that lowers your body's ability to fight infection (immunocompromised). ? Heart disease, including heart failure, a condition in which the arteries that lead to the heart become narrow or blocked (coronary artery disease), a disease which makes the heart muscle thick, weak, or stiff (cardiomyopathy). ? Diabetes. ? Chronic kidney disease. ? Sickle cell disease, a condition in which red blood cells have an abnormal "sickle" shape. ? Liver disease.  Are obese. What are the signs or symptoms? Symptoms of this condition can range from mild to severe. Symptoms may appear any time from 2 to 14 days after being exposed to the virus. They include:  A fever or chills.  A cough.  Difficulty breathing.  Headaches, body aches, or muscle aches.  Runny or stuffy (congested) nose.  A sore throat.  New loss of taste or smell. Some people may also have stomach problems, such as nausea, vomiting, or diarrhea. Other people may not have any symptoms of COVID-19. How is this diagnosed? This condition may be diagnosed based on:  Your signs and symptoms, especially if: ? You live in an area with a COVID-19 outbreak. ? You recently traveled to or from an area where the virus is common. ? You   provide care for or live with a person who was diagnosed with COVID-19. ? You were exposed to a person who was diagnosed with COVID-19.  A physical exam.  Lab tests, which may include: ? Taking a sample of fluid from the back of your nose and throat (nasopharyngeal fluid), your nose, or your throat using a swab. ? A sample of mucus from your lungs (sputum). ? Blood tests.  Imaging tests, which  may include, X-rays, CT scan, or ultrasound. How is this treated? At present, there is no medicine to treat COVID-19. Medicines that treat other diseases are being used on a trial basis to see if they are effective against COVID-19. Your health care provider will talk with you about ways to treat your symptoms. For most people, the infection is mild and can be managed at home with rest, fluids, and over-the-counter medicines. Treatment for a serious infection usually takes places in a hospital intensive care unit (ICU). It may include one or more of the following treatments. These treatments are given until your symptoms improve.  Receiving fluids and medicines through an IV.  Supplemental oxygen. Extra oxygen is given through a tube in the nose, a face mask, or a hood.  Positioning you to lie on your stomach (prone position). This makes it easier for oxygen to get into the lungs.  Continuous positive airway pressure (CPAP) or bi-level positive airway pressure (BPAP) machine. This treatment uses mild air pressure to keep the airways open. A tube that is connected to a motor delivers oxygen to the body.  Ventilator. This treatment moves air into and out of the lungs by using a tube that is placed in your windpipe.  Tracheostomy. This is a procedure to create a hole in the neck so that a breathing tube can be inserted.  Extracorporeal membrane oxygenation (ECMO). This procedure gives the lungs a chance to recover by taking over the functions of the heart and lungs. It supplies oxygen to the body and removes carbon dioxide. Follow these instructions at home: Lifestyle  If you are sick, stay home except to get medical care. Your health care provider will tell you how long to stay home. Call your health care provider before you go for medical care.  Rest at home as told by your health care provider.  Do not use any products that contain nicotine or tobacco, such as cigarettes, e-cigarettes, and  chewing tobacco. If you need help quitting, ask your health care provider.  Return to your normal activities as told by your health care provider. Ask your health care provider what activities are safe for you. General instructions  Take over-the-counter and prescription medicines only as told by your health care provider.  Drink enough fluid to keep your urine pale yellow.  Keep all follow-up visits as told by your health care provider. This is important. How is this prevented?  There is no vaccine to help prevent COVID-19 infection. However, there are steps you can take to protect yourself and others from this virus. To protect yourself:   Do not travel to areas where COVID-19 is a risk. The areas where COVID-19 is reported change often. To identify high-risk areas and travel restrictions, check the CDC travel website: wwwnc.cdc.gov/travel/notices  If you live in, or must travel to, an area where COVID-19 is a risk, take precautions to avoid infection. ? Stay away from people who are sick. ? Wash your hands often with soap and water for 20 seconds. If soap and water   are not available, use an alcohol-based hand sanitizer. ? Avoid touching your mouth, face, eyes, or nose. ? Avoid going out in public, follow guidance from your state and local health authorities. ? If you must go out in public, wear a cloth face covering or face mask. Make sure your mask covers your nose and mouth. ? Avoid crowded indoor spaces. Stay at least 6 feet (2 meters) away from others. ? Disinfect objects and surfaces that are frequently touched every day. This may include:  Counters and tables.  Doorknobs and light switches.  Sinks and faucets.  Electronics, such as phones, remote controls, keyboards, computers, and tablets. To protect others: If you have symptoms of COVID-19, take steps to prevent the virus from spreading to others.  If you think you have a COVID-19 infection, contact your health care  provider right away. Tell your health care team that you think you may have a COVID-19 infection.  Stay home. Leave your house only to seek medical care. Do not use public transport.  Do not travel while you are sick.  Wash your hands often with soap and water for 20 seconds. If soap and water are not available, use alcohol-based hand sanitizer.  Stay away from other members of your household. Let healthy household members care for children and pets, if possible. If you have to care for children or pets, wash your hands often and wear a mask. If possible, stay in your own room, separate from others. Use a different bathroom.  Make sure that all people in your household wash their hands well and often.  Cough or sneeze into a tissue or your sleeve or elbow. Do not cough or sneeze into your hand or into the air.  Wear a cloth face covering or face mask. Make sure your mask covers your nose and mouth. Where to find more information  Centers for Disease Control and Prevention: www.cdc.gov/coronavirus/2019-ncov/index.html  World Health Organization: www.who.int/health-topics/coronavirus Contact a health care provider if:  You live in or have traveled to an area where COVID-19 is a risk and you have symptoms of the infection.  You have had contact with someone who has COVID-19 and you have symptoms of the infection. Get help right away if:  You have trouble breathing.  You have pain or pressure in your chest.  You have confusion.  You have bluish lips and fingernails.  You have difficulty waking from sleep.  You have symptoms that get worse. These symptoms may represent a serious problem that is an emergency. Do not wait to see if the symptoms will go away. Get medical help right away. Call your local emergency services (911 in the [place] Do not drive yourself to the hospital. Let the emergency medical personnel know if you think you have COVID-19. Summary  COVID-19 is a  respiratory infection that is caused by a virus. It is also known as coronavirus disease or novel coronavirus. It can cause serious infections, such as pneumonia, acute respiratory distress syndrome, acute respiratory failure, or sepsis.  The virus that causes COVID-19 is contagious. This means that it can spread from person to person through droplets from breathing, speaking, singing, coughing, or sneezing.  You are more likely to develop a serious illness if you are 50 years of age or older, have a weak immune system, live in a nursing home, or have chronic disease.  There is no medicine to treat COVID-19. Your health care provider will talk with you about ways to treat your symptoms.    Take steps to protect yourself and others from infection. Wash your hands often and disinfect objects and surfaces that are frequently touched every day. Stay away from people who are sick and wear a mask if you are sick. This information is not intended to replace advice given to you by your health care provider. Make sure you discuss any questions you have with your health care provider. Document Revised: 11/20/2018 Document Reviewed: 02/26/2018 [employer] Patient Education  2020 [employer]  

## 2019-08-31 ENCOUNTER — Telehealth: Payer: Self-pay

## 2019-08-31 NOTE — Chronic Care Management (AMB) (Signed)
  Care Management   Note  08/31/2019 Name: Sara Zhang MRN: [id number] DOB: 16-Jan-1971  Sara Zhang is a 49 y.o. year old female who is a primary care patient of Marjie Skiff, NP and is actively engaged with the care management team. I reached out to Sara Zhang by phone today to assist with re-scheduling a follow up visit with the Pharmacist  Follow up plan: Unsuccessful telephone outreach attempt made. A HIPPA compliant phone message was left for the patient providing contact information and requesting a return call.  The care management team will reach out to the patient again over the next 7 days.  If patient returns call to provider office, please advise to call Embedded Care Management Care Guide Penne Lash  at [phone]  Penne Lash, RMA Care Guide, Embedded Care Coordination [facility]  [place], [place] [address] Direct Dial: [phone] [email or web address] Website: [email or web address]

## 2019-09-04 ENCOUNTER — Ambulatory Visit: Payer: Medicare Other

## 2019-09-06 NOTE — Chronic Care Management (AMB) (Signed)
°  Care Management   Note  09/06/2019 Name: KARIMAH WINQUIST MRN: [id number] DOB: 09-30-1970  [name] is a 49 y.o. year old female who is a primary care patient of Marjie Skiff, NP and is actively engaged with the care management team. I reached out to [name] by phone today to assist with re-scheduling a follow up visit with the Pharmacist  Follow up plan: Telephone appointment with care management team member scheduled for:10/01/2019  Penne Lash, RMA Care Guide, Embedded Care Coordination [facility]  [place], [place] [address] Direct Dial: [phone] [email or web address] Website: [email or web address]

## 2019-09-20 ENCOUNTER — Ambulatory Visit (INDEPENDENT_AMBULATORY_CARE_PROVIDER_SITE_OTHER): Payer: Medicare Other

## 2019-09-20 VITALS — Ht 69.0 in | Wt 369.0 lb

## 2019-09-20 DIAGNOSIS — Z0000 Encounter for general adult medical examination without abnormal findings: Secondary | ICD-10-CM | POA: Diagnosis not present

## 2019-09-20 NOTE — Progress Notes (Signed)
I connected with Sara Zhang today by telephone and verified that I am speaking with the correct person using two identifiers. Location patient: home Location provider: work Persons participating in the virtual visit: Sara Zhang, Glenna Durand LPN.   I discussed the limitations, risks, security and privacy concerns of performing an evaluation and management service by telephone and the availability of in person appointments. I also discussed with the patient that there may be a patient responsible charge related to this service. The patient expressed understanding and verbally consented to this telephonic visit.    Interactive audio and video telecommunications were attempted between this provider and patient, however failed, due to patient having technical difficulties OR patient did not have access to video capability.  We continued and completed visit with audio only.    Vital signs may be patient reported or missing.   Subjective:   Sara Zhang is a 49 y.o. female who presents for Medicare Annual (Subsequent) preventive examination.  Review of Systems     Cardiac Risk Factors include: diabetes mellitus;dyslipidemia;hypertension;obesity (BMI >30kg/m2);sedentary lifestyle     Objective:    Today's Vitals   09/20/19 0811  Weight: (!) 369 lb (167.4 kg)  Height: 5' 9"  (1.753 m)   Body mass index is 54.49 kg/m.  Advanced Directives 09/20/2019 02/03/2019 02/02/2019 08/19/2018 08/13/2017 08/08/2016 12/16/2015  Does Patient Have a Medical Advance Directive? No No No No No No No  Would patient like information on creating a medical advance directive? - No - Patient declined - - Yes (MAU/Ambulatory/Procedural Areas - Information given) Yes (MAU/Ambulatory/Procedural Areas - Information given) No - patient declined information    Current Medications (verified) Outpatient Encounter Medications as of 09/20/2019  Medication Sig  . amLODipine (NORVASC) 5 MG tablet Take 1 tablet (5 mg  total) by mouth daily.  Marland Kitchen atenolol (TENORMIN) 50 MG tablet Take 1 tablet (50 mg total) by mouth daily.  Marland Kitchen atorvastatin (LIPITOR) 10 MG tablet TAKE 1 TABLET(10 MG) BY MOUTH DAILY  . blood glucose meter kit and supplies KIT Dispense based on patient and insurance preference. Use up to four times daily as directed. (FOR ICD-9 250.00, 250.01).  Marland Kitchen BYDUREON BCISE 2 MG/0.85ML AUIJ INJECT 2 MG UNDER THE SKIN EVERY WEEK  . Calcium Carbonate-Vitamin D (OYSTER SHELL CALCIUM 500 + D) 500-125 MG-UNIT TABS Take 1 tablet by mouth daily.   . Cholecalciferol (VITAMIN D3) 10 MCG (400 UNIT) tablet Take 400 Units by mouth daily.  Marland Kitchen glucose blood (CONTOUR NEXT TEST) test strip TEST BLOOD SUGAR ONCE DAILY  . Insulin Glargine (TOUJEO SOLOSTAR) 300 UNIT/ML SOPN Inject 54 Units into the skin at bedtime.  . Insulin Pen Needle 32G X 4 MM MISC 1 Units by Does not apply route every morning. Pen needles  . JARDIANCE 25 MG TABS tablet Take 25 mg by mouth daily.  Marland Kitchen levonorgestrel (MIRENA) 20 MCG/24HR IUD 1 each by Intrauterine route once.  Marland Kitchen lisinopril (ZESTRIL) 40 MG tablet TAKE 1 TABLET BY MOUTH EVERY DAY  . OXYGEN Inhale 2 L into the lungs daily.  . furosemide (LASIX) 40 MG tablet Take 1 tablet (40 mg total) by mouth 2 (two) times daily.   No facility-administered encounter medications on file as of 09/20/2019.    Allergies (verified) Patient has no known allergies.   History: Past Medical History:  Diagnosis Date  . Abnormal uterine bleeding (AUB) 11/25/2016  . Chronic diastolic CHF (congestive heart failure) ([place])    a. 02/2012 Echo: EF 25-35%, glob HK; b. 06/2016  Echo: EF 55-60%, mild MR, nl PASP.  Marland Kitchen CKD (chronic kidney disease), stage II   . COVID-19   . Diabetes mellitus without complication ([place])   . Extreme obesity   . Hyperlipidemia   . Hypertension   . Hypoxia   . Microcytic anemia   . Pneumonia   . Sleep apnea   . Status post endometrial ablation 06/20/2016   2015 hysteroscopy/D&C with NovaSure  endometrial ablation; pathology-benign endometrial polyps  . Thickened endometrium 06/20/2016   Ultrasound-22.3 mm endometrial stripe, heterogenous  . Vaginal bleeding    a. 01/2017 s/p hyteroscopy and polypectomy [facility]).  . Vitamin D deficiency    Past Surgical History:  Procedure Laterality Date  . CESAREAN SECTION    . CHOLECYSTECTOMY    . DILATION AND CURETTAGE, DIAGNOSTIC / THERAPEUTIC    . TONSILLECTOMY AND ADENOIDECTOMY    . TOOTH EXTRACTION  01/2016   Family History  Problem Relation Age of Onset  . Diabetes Mother   . Hyperlipidemia Mother   . Hypertension Mother   . Stroke Mother   . Arthritis Father   . Asthma Father   . Diabetes Father   . Hypertension Father   . Hypertension Brother   . Diabetes Brother   . Diabetes Maternal Grandmother   . Hypertension Maternal Grandmother   . Diabetes Maternal Grandfather   . Hypertension Maternal Grandfather   . Diabetes Paternal Grandmother   . Hypertension Paternal Grandmother   . Heart attack Maternal Uncle   . Breast cancer Neg Hx    Social History   Socioeconomic History  . Marital status: Single    Spouse name: Not on file  . Number of children: Not on file  . Years of education: GED  . Highest education level: GED or equivalent  Occupational History  . Occupation: disability  Tobacco Use  . Smoking status: Never Smoker  . Smokeless tobacco: Never Used  Vaping Use  . Vaping Use: Never used  Substance and Sexual Activity  . Alcohol use: Yes    Alcohol/week: 0.0 standard drinks    Comment: 1-2 beers/month  . Drug use: No  . Sexual activity: Yes    Birth control/protection: None  Other Topics Concern  . Not on file  Social History Narrative   The patient's son lives with her.  The patient recently lost an aunt in December 2020 and feels she got C19 related to the amount of family and friends coming in and out to pay respects to her aunt.    Social Determinants of Health   Financial Resource Strain: Low  Risk   . Difficulty of Paying Living Expenses: Not hard at all  Food Insecurity: No Food Insecurity  . Worried About Charity fundraiser in the Last Year: Never true  . Ran Out of Food in the Last Year: Never true  Transportation Needs: No Transportation Needs  . Lack of Transportation (Medical): No  . Lack of Transportation (Non-Medical): No  Physical Activity:   . Days of Exercise per Week:   . Minutes of Exercise per Session:   Stress: No Stress Concern Present  . Feeling of Stress : Not at all  Social Connections: Moderately Isolated  . Frequency of Communication with Friends and Family: More than three times a week  . Frequency of Social Gatherings with Friends and Family: More than three times a week  . Attends Religious Services: 1 to 4 times per year  . Active Member of Clubs or Organizations: No  .  Attends Archivist Meetings: Never  . Marital Status: Never married    Tobacco Counseling Counseling given: Not Answered   Clinical Intake:  Pre-visit preparation completed: Yes  Pain : No/denies pain     Nutritional Status: BMI > 30  Obese Nutritional Risks: None Diabetes: Yes  How often do you need to have someone help you when you read instructions, pamphlets, or other written materials from your doctor or pharmacy?: 1 - Never What is the last grade level you completed in school?: 11th grae  Diabetic? Yes Nutrition Risk Assessment:  Has the patient had any N/V/D within the last 2 months?  No  Does the patient have any non-healing wounds?  No  Has the patient had any unintentional weight loss or weight gain?  No   Diabetes:  Is the patient diabetic?  Yes  If diabetic, was a CBG obtained today?  No  Did the patient bring in their glucometer from home?  No  How often do you monitor your CBG's? 2-3 daily.   Financial Strains and Diabetes Management:  Are you having any financial strains with the device, your supplies or your medication? No .  Does  the patient want to be seen by Chronic Care Management for management of their diabetes?  No  Would the patient like to be referred to a Nutritionist or for Diabetic Management?  No   Diabetic Exams:  Diabetic Eye Exam: Completed 11/05/2018 Diabetic Foot Exam: Completed 12/01/2018   Interpreter Needed?: No  Information entered by :: NAllen LPN   Activities of Daily Living In your present state of health, do you have any difficulty performing the following activities: 09/20/2019 02/03/2019  Hearing? N N  Vision? N N  Difficulty concentrating or making decisions? N N  Walking or climbing stairs? Y N  Dressing or bathing? N N  Doing errands, shopping? N N  Preparing Food and eating ? N -  Using the Toilet? N -  In the past six months, have you accidently leaked urine? N -  Do you have problems with loss of bowel control? N -  Managing your Medications? N -  Managing your Finances? N -  Housekeeping or managing your Housekeeping? N -  Some recent data might be hidden    Patient Care Team: Venita Lick, NP as PCP - General (Nurse Practitioner) End, Harrell Gave, MD as PCP - Cardiology (Cardiology) Sharlotte Alamo, DPM (Podiatry) Defrancesco, Alanda Slim, MD as Consulting Physician (Obstetrics and Gynecology) Leandrew Koyanagi, MD as Referring Physician (Ophthalmology) Vanita Ingles, RN as Case Manager (General Practice)  Indicate any recent Medical Services you may have received from other than [facility] providers in the past year (date may be approximate).     Assessment:   This is a routine wellness examination for [place].  Hearing/Vision screen  Hearing Screening   125Hz  250Hz  500Hz  1000Hz  2000Hz  3000Hz  4000Hz  6000Hz  8000Hz   Right ear:           Left ear:           Vision Screening Comments: Regular eye exams, [facility]  Dietary issues and exercise activities discussed: Current Exercise Habits: The patient does not participate in regular exercise at  present  Goals    .  Increase water intake      Recommend drinking at least 4-5 glasses of water a day    .  Patient Stated      09/20/2019, wants to get under 300 pounds    .  PharmD "I have a lot of medications" (pt-stated)      CARE PLAN ENTRY (see longtitudinal plan of care for additional care plan information)  Current Barriers:  . Diabetes: controlled, complicated by CHF, s/p COVID; most recent A1c 6.8% o Reports she is pleased w/ last A1c . Current antihyperglycemic regimen: Toujeo 54 units, Bydureon 2 mg weekly, Jardiance 25 mg daily  . Current meals patterns: o Drinks: now drinks [employer], coke zero; cut back on sweets and snacks;  o Notes that she hasn't eaten pork skins in 2 weeks . Cardiovascular risk reduction- HFpEF, most recent EF 55-60% follows w/ Dr. Saunders Revel; is not weighing at home because she "gets disappointed" when she looks at the scale o Current hypertensive regimen: amlodipine 5 mg QAM, atenolol 50 mg QAM, lisinopril 40 mg QAM; furosemide 40 mg BID- takes first dose around 7-7:30 am, second dose around 4-5 pm; current BP readings: 120-130s/80 on her dad's cuff when she occasionally uses it  o Current hyperlipidemia regimen: atorvastatin 10 mg daily; last LDL at goal <70 . S/p COVID dx: currently on 2L O2  Pharmacist Clinical Goal(s):  Marland Kitchen Over the next 90 days, patient with work with PharmD and primary care provider to address optimized medication management  Interventions: . Comprehensive medication review performed, medication list updated in electronic medical record . Inter-disciplinary care team collaboration (see longitudinal plan of care) . Reviewed that [place] Medicaid now covers Trulicity. Will encourage her to discuss w/ endocrinology at next appointment, as Trulicity may provide better weight loss benefit than Bydureon . Reviewed benefits of having BP machine for herself. Will collaborate w/ office staff to send prescription for BP machine to [facility] . Reviewed benefits of daily weight to evaluate fluid status.  . Praised for avoidance of pig skins and high sodium foods.   Patient Self Care Activities:  . Patient will check blood glucose BID, document, and provide at future appointments . Patient will take medications as prescribed . Patient will report any questions or concerns to provider   Please see past updates related to this goal by clicking on the "Past Updates" button in the selected goal      .  RNCM;  Disease Management and Care Coordination Needs related to [place] diagnosis in a patient with Chronic disease management needs      Current Barriers:  . Disease Management support and education needs related to recent hospitalization for COVID19 positive test and symptoms of shortness of breath presenting to the ED with admit to the hospital from 02-03-2019 to 02-07-2019, in a patient with CHF, DM, CKD, Hyperlipidemia.   Nurse Case Manager Clinical Goal(s):  Marland Kitchen Over the next 30 days, patient will meet with RN Care Manager to address any new concerns/symptoms related to chronic conditions of CHF, DM, CKD, Hyperlipidemia and recent acute onset of COVID19 with admit to the hospital on 02-03-2019; with discharge from the hospital on 02-07-2019 . Over the next 30 days, patient will not experience hospital admission. Hospital Admissions in last 6 months = 1 . Over the next 30 days, patient will demonstrate a decrease in acute respiratory  exacerbations/symptoms as evidenced by being able to return to baseline oxygen therapy of 2 liters via  both during the day and at night . Over the next 30 days, patient will attend all scheduled medical appointments: Next appointment to see pcp is scheduled on 03-03-2019  Interventions:  . Advised patient to notify provider of any changes in condition and  to seek emergent care for new or worsening shortness of breath, or exacerbations of chronic conditions of HF, DM, CKD, and HLD . Provided  education to patient re: COVID19 . For information about COVID-19 or "Corona Virus", the following web resources may be helpful: . CDC: BeginnerSteps.be   . [place]:  [email or web address]  . Reviewed medications with patient and discussed notify CCM pharmacist or RNCM of changes . Discussed plans with patient for ongoing care management follow up and provided patient with direct contact information for care management team . Reviewed scheduled/upcoming provider appointments including: blood work with A1C lab work/follow up appointment with pcp on 03-03-2019 . Advised patient, providing education and rationale, to check cbg TID or as directed by provider and record, calling provider for findings outside established parameters.  The patient expressed elevated CBG's during hospitalization due receiving steroids as a part of the treatment plan for increased shob with diagnosis of COVID 19    Patient Self Care Activities:  . Patient verbalizes understanding of plan to follow up in 30 days with the [facility] or sooner if the patient has questions/needs assistance . Self administers medications as prescribed . Attends all scheduled provider appointments . Calls provider office for new concerns or questions . Start using new blood pressure cuff to measure and report blood pressure readings to the provider and RNCM   Initial goal documentation       Depression Screen PHQ 2/9 Scores 09/20/2019 08/19/2018 08/13/2017 08/08/2016 08/02/2015 04/25/2015  PHQ - 2 Score 0 0 2 3 1  0  PHQ- 9 Score 0 - 5 7 - -    Fall Risk Fall Risk  09/20/2019 08/19/2018 08/13/2017 08/08/2016 08/02/2015  Falls in the past year? 0 0 No No No  Follow up Falls evaluation completed;Education provided - - - -    Any stairs in or around the home? Yes  If so, are there any without handrails? No  Home free of loose  throw rugs in walkways, pet beds, electrical cords, etc? Yes  Adequate lighting in your home to reduce risk of falls? Yes   ASSISTIVE DEVICES UTILIZED TO PREVENT FALLS:  Life alert? No  Use of a cane, walker or w/c? No  Grab bars in the bathroom? No  Shower chair or bench in shower? No  Elevated toilet seat or a handicapped toilet? No   TIMED UP AND GO:  Was the test performed? No .  Cognitive Function:     6CIT Screen 09/20/2019 08/13/2017 08/08/2016  What Year? 0 points 0 points 0 points  What month? 0 points 0 points 0 points  What time? 0 points 0 points 0 points  Count back from 20 0 points 0 points 0 points  Months in reverse 0 points 0 points 0 points  Repeat phrase 0 points 0 points 0 points  Total Score 0 0 0    Immunizations Immunization History  Administered Date(s) Administered  . Pneumococcal Polysaccharide-23 01/12/2007, 04/18/2009  . Tdap 07/07/2007    TDAP status: Due, Education has been provided regarding the importance of this vaccine. Advised may receive this vaccine at local pharmacy or Health Dept. Aware to provide a copy of the vaccination record if obtained from local pharmacy or Health Dept. Verbalized acceptance and understanding. Flu Vaccine status: Declined, Education has been provided regarding the importance of this vaccine but patient still declined. Advised may receive this vaccine at local pharmacy or Health Dept. Aware to provide a copy of the vaccination record if obtained from  local pharmacy or Health Dept. Verbalized acceptance and understanding. Pneumococcal vaccine status: Up to date Covid-19 vaccine status: Needs second dose  Qualifies for Shingles Vaccine? Yes   Zostavax completed No   Shingrix Completed?: No.    Education has been provided regarding the importance of this vaccine. Patient has been advised to call insurance company to determine out of pocket expense if they have not yet received this vaccine. Advised may also receive  vaccine at local pharmacy or Health Dept. Verbalized acceptance and understanding.  Screening Tests Health Maintenance  Topic Date Due  . Hepatitis C Screening  Never done  . COVID-19 Vaccine (1) Never done  . COLONOSCOPY  Never done  . TETANUS/TDAP  12/01/2019 (Originally 07/06/2017)  . INFLUENZA VACCINE  05/04/2020 (Originally 09/05/2019)  . OPHTHALMOLOGY EXAM  11/05/2019  . FOOT EXAM  12/01/2019  . HEMOGLOBIN A1C  12/16/2019  . PAP SMEAR-Modifier  01/21/2020  . MAMMOGRAM  01/14/2021  . PNEUMOCOCCAL POLYSACCHARIDE VACCINE AGE 52-64 HIGH RISK  Completed  . HIV Screening  Completed    Health Maintenance  Health Maintenance Due  Topic Date Due  . Hepatitis C Screening  Never done  . COVID-19 Vaccine (1) Never done  . COLONOSCOPY  Never done    Colorectal cancer screening: Thinks may be due Mammogram status: Completed 01/15/2019. Repeat every year Bone Density status: n/a  Lung Cancer Screening: (Low Dose CT Chest recommended if Age 57-80 years, 30 pack-year currently smoking OR have quit w/in 15years.) does not qualify.   Lung Cancer Screening Referral: no  Additional Screening:  Hepatitis C Screening: does qualify; discussed  Vision Screening: Recommended annual ophthalmology exams for early detection of glaucoma and other disorders of the eye. Is the patient up to date with their annual eye exam?  Yes  Who is the provider or what is the name of the office in which the patient attends annual eye exams? [facility] If pt is not established with a provider, would they like to be referred to a provider to establish care? No .   Dental Screening: Recommended annual dental exams for proper oral hygiene  Community Resource Referral / Chronic Care Management: CRR required this visit?  No   CCM required this visit?  No      Plan:     I have personally reviewed and noted the following in the patient's chart:   . Medical and social history . Use of alcohol,  tobacco or illicit drugs  . Current medications and supplements . Functional ability and status . Nutritional status . Physical activity . Advanced directives . List of other physicians . Hospitalizations, surgeries, and ER visits in previous 12 months . Vitals . Screenings to include cognitive, depression, and falls . Referrals and appointments  In addition, I have reviewed and discussed with patient certain preventive protocols, quality metrics, and best practice recommendations. A written personalized care plan for preventive services as well as general preventive health recommendations were provided to patient.     Kellie Simmering, LPN   6/60/6301   Nurse Notes:

## 2019-09-20 NOTE — Patient Instructions (Signed)
[name] , Thank you for taking time to come for your Medicare Wellness Visit. I appreciate your ongoing commitment to your health goals. Please review the following plan we discussed and let me know if I can assist you in the future.   Screening recommendations/referrals: Colonoscopy: Due Mammogram: completed 01/14/2019 Bone Density: n/a Recommended yearly ophthalmology/optometry visit for glaucoma screening and checkup Recommended yearly dental visit for hygiene and checkup  Vaccinations: Influenza vaccine: decline Pneumococcal vaccine: completed 04/18/2009 Tdap vaccine: due Shingles vaccine: discussed  Covid-19: needs second dose  Advanced directives: Advance directive discussed with you today. Even though you declined this today please call our office should you change your mind and we can give you the proper paperwork for you to fill out.   Conditions/risks identified: none  Next appointment: Follow up in one year for your annual wellness visit.   Preventive Care 40-64 Years, Female Preventive care refers to lifestyle choices and visits with your health care provider that can promote health and wellness. What does preventive care include?  A yearly physical exam. This is also called an annual well check.  Dental exams once or twice a year.  Routine eye exams. Ask your health care provider how often you should have your eyes checked.  Personal lifestyle choices, including:  Daily care of your teeth and gums.  Regular physical activity.  Eating a healthy diet.  Avoiding tobacco and drug use.  Limiting alcohol use.  Practicing safe sex.  Taking low-dose aspirin daily starting at age 45.  Taking vitamin and mineral supplements as recommended by your health care provider. What happens during an annual well check? The services and screenings done by your health care provider during your annual well check will depend on your age, overall health, lifestyle risk  factors, and family history of disease. Counseling  Your health care provider may ask you questions about your:  Alcohol use.  Tobacco use.  Drug use.  Emotional well-being.  Home and relationship well-being.  Sexual activity.  Eating habits.  Work and work Statistician.  Method of birth control.  Menstrual cycle.  Pregnancy history. Screening  You may have the following tests or measurements:  Height, weight, and BMI.  Blood pressure.  Lipid and cholesterol levels. These may be checked every 5 years, or more frequently if you are over 63 years old.  Skin check.  Lung cancer screening. You may have this screening every year starting at age 63 if you have a 30-pack-year history of smoking and currently smoke or have quit within the past 15 years.  Fecal occult blood test (FOBT) of the stool. You may have this test every year starting at age 55.  Flexible sigmoidoscopy or colonoscopy. You may have a sigmoidoscopy every 5 years or a colonoscopy every 10 years starting at age 84.  Hepatitis C blood test.  Hepatitis B blood test.  Sexually transmitted disease (STD) testing.  Diabetes screening. This is done by checking your blood sugar (glucose) after you have not eaten for a while (fasting). You may have this done every 1-3 years.  Mammogram. This may be done every 1-2 years. Talk to your health care provider about when you should start having regular mammograms. This may depend on whether you have a family history of breast cancer.  BRCA-related cancer screening. This may be done if you have a family history of breast, ovarian, tubal, or peritoneal cancers.  Pelvic exam and Pap test. This may be done every 3 years starting at  age 52. Starting at age 70, this may be done every 5 years if you have a Pap test in combination with an HPV test.  Bone density scan. This is done to screen for osteoporosis. You may have this scan if you are at high risk for  osteoporosis. Discuss your test results, treatment options, and if necessary, the need for more tests with your health care provider. Vaccines  Your health care provider may recommend certain vaccines, such as:  Influenza vaccine. This is recommended every year.  Tetanus, diphtheria, and acellular pertussis (Tdap, Td) vaccine. You may need a Td booster every 10 years.  Zoster vaccine. You may need this after age 55.  Pneumococcal 13-valent conjugate (PCV13) vaccine. You may need this if you have certain conditions and were not previously vaccinated.  Pneumococcal polysaccharide (PPSV23) vaccine. You may need one or two doses if you smoke cigarettes or if you have certain conditions. Talk to your health care provider about which screenings and vaccines you need and how often you need them. This information is not intended to replace advice given to you by your health care provider. Make sure you discuss any questions you have with your health care provider. Document Released: 02/17/2015 Document Revised: 10/11/2015 Document Reviewed: 11/22/2014 Elsevier Interactive Patient Education  2017 [place] Prevention in the Home Falls can cause injuries. They can happen to people of all ages. There are many things you can do to make your home safe and to help prevent falls. What can I do on the outside of my home?  Regularly fix the edges of walkways and driveways and fix any cracks.  Remove anything that might make you trip as you walk through a door, such as a raised step or threshold.  Trim any bushes or trees on the path to your home.  Use bright outdoor lighting.  Clear any walking paths of anything that might make someone trip, such as rocks or tools.  Regularly check to see if handrails are loose or broken. Make sure that both sides of any steps have handrails.  Any raised decks and porches should have guardrails on the edges.  Have any leaves, snow, or ice cleared  regularly.  Use sand or salt on walking paths during winter.  Clean up any spills in your garage right away. This includes oil or grease spills. What can I do in the bathroom?  Use night lights.  Install grab bars by the toilet and in the tub and shower. Do not use towel bars as grab bars.  Use non-skid mats or decals in the tub or shower.  If you need to sit down in the shower, use a plastic, non-slip stool.  Keep the floor dry. Clean up any water that spills on the floor as soon as it happens.  Remove soap buildup in the tub or shower regularly.  Attach bath mats securely with double-sided non-slip rug tape.  Do not have throw rugs and other things on the floor that can make you trip. What can I do in the bedroom?  Use night lights.  Make sure that you have a light by your bed that is easy to reach.  Do not use any sheets or blankets that are too big for your bed. They should not hang down onto the floor.  Have a firm chair that has side arms. You can use this for support while you get dressed.  Do not have throw rugs and other things  on the floor that can make you trip. What can I do in the kitchen?  Clean up any spills right away.  Avoid walking on wet floors.  Keep items that you use a lot in easy-to-reach places.  If you need to reach something above you, use a strong step stool that has a grab bar.  Keep electrical cords out of the way.  Do not use floor polish or wax that makes floors slippery. If you must use wax, use non-skid floor wax.  Do not have throw rugs and other things on the floor that can make you trip. What can I do with my stairs?  Do not leave any items on the stairs.  Make sure that there are handrails on both sides of the stairs and use them. Fix handrails that are broken or loose. Make sure that handrails are as long as the stairways.  Check any carpeting to make sure that it is firmly attached to the stairs. Fix any carpet that is loose  or worn.  Avoid having throw rugs at the top or bottom of the stairs. If you do have throw rugs, attach them to the floor with carpet tape.  Make sure that you have a light switch at the top of the stairs and the bottom of the stairs. If you do not have them, ask someone to add them for you. What else can I do to help prevent falls?  Wear shoes that:  Do not have high heels.  Have rubber bottoms.  Are comfortable and fit you well.  Are closed at the toe. Do not wear sandals.  If you use a stepladder:  Make sure that it is fully opened. Do not climb a closed stepladder.  Make sure that both sides of the stepladder are locked into place.  Ask someone to hold it for you, if possible.  Clearly mark and make sure that you can see:  Any grab bars or handrails.  First and last steps.  Where the edge of each step is.  Use tools that help you move around (mobility aids) if they are needed. These include:  Canes.  Walkers.  Scooters.  Crutches.  Turn on the lights when you go into a dark area. Replace any light bulbs as soon as they burn out.  Set up your furniture so you have a clear path. Avoid moving your furniture around.  If any of your floors are uneven, fix them.  If there are any pets around you, be aware of where they are.  Review your medicines with your doctor. Some medicines can make you feel dizzy. This can increase your chance of falling. Ask your doctor what other things that you can do to help prevent falls. This information is not intended to replace advice given to you by your health care provider. Make sure you discuss any questions you have with your health care provider. Document Released: 11/17/2008 Document Revised: 06/29/2015 Document Reviewed: 02/25/2014 Elsevier Interactive Patient Education  2017 [employer].

## 2019-09-21 ENCOUNTER — Other Ambulatory Visit: Payer: Self-pay

## 2019-09-21 ENCOUNTER — Ambulatory Visit (INDEPENDENT_AMBULATORY_CARE_PROVIDER_SITE_OTHER): Payer: Medicare Other | Admitting: Nurse Practitioner

## 2019-09-21 ENCOUNTER — Encounter: Payer: Self-pay | Admitting: Nurse Practitioner

## 2019-09-21 VITALS — BP 119/78 | HR 74 | Temp 98.2°F | Wt 360.4 lb

## 2019-09-21 DIAGNOSIS — Z6841 Body Mass Index (BMI) 40.0 and over, adult: Secondary | ICD-10-CM | POA: Diagnosis not present

## 2019-09-21 DIAGNOSIS — I5032 Chronic diastolic (congestive) heart failure: Secondary | ICD-10-CM | POA: Diagnosis not present

## 2019-09-21 DIAGNOSIS — E119 Type 2 diabetes mellitus without complications: Secondary | ICD-10-CM

## 2019-09-21 DIAGNOSIS — E785 Hyperlipidemia, unspecified: Secondary | ICD-10-CM | POA: Diagnosis not present

## 2019-09-21 DIAGNOSIS — I130 Hypertensive heart and chronic kidney disease with heart failure and stage 1 through stage 4 chronic kidney disease, or unspecified chronic kidney disease: Secondary | ICD-10-CM

## 2019-09-21 DIAGNOSIS — N182 Chronic kidney disease, stage 2 (mild): Secondary | ICD-10-CM

## 2019-09-21 DIAGNOSIS — E1169 Type 2 diabetes mellitus with other specified complication: Secondary | ICD-10-CM

## 2019-09-21 DIAGNOSIS — Z794 Long term (current) use of insulin: Secondary | ICD-10-CM

## 2019-09-21 LAB — BAYER DCA HB A1C WAIVED: HB A1C (BAYER DCA - WAIVED): 6.6 % (ref <7.0)

## 2019-09-21 NOTE — Assessment & Plan Note (Signed)
Chronic, ongoing with BP at goal today.  Continue current medication regimen and collaboration with cardiology.  Recommend checking BP three mornings a week at home + documenting for providers.  BMP today.  Recommend DASH diet focus and avoidance of high sodium foods, praised for cutting back on pork skins.  Return in 3 months.

## 2019-09-21 NOTE — Assessment & Plan Note (Signed)
Chronic, ongoing with BP at goal.  Continue current medication regimen and collaboration with cardiology.  Praised for cutting back on pork skins.  Recommend: - Reminded to call for an overnight weight gain of >2 pounds or a weekly weight weight of >5 pounds - not adding salt to his food and has been reading food labels. Reviewed the importance of keeping daily sodium intake to 2000mg  daily -- NO PORK SKINS

## 2019-09-21 NOTE — Progress Notes (Signed)
BP 119/78   Pulse 74   Temp 98.2 F (36.8 C) (Oral)   Wt (!) 360 lb 6.4 oz (163.5 kg)   SpO2 93%   BMI 53.22 kg/m    Subjective:    Patient ID: Sara Zhang, female    DOB: Feb 22, 1970, 49 y.o.   MRN: [id number]  HPI: Sara Zhang is a 49 y.o. female  Chief Complaint  Patient presents with  . Diabetes  . Hyperlipidemia  . Hypertension   DIABETES Current medications include Jardiance 25 MG, Toujeo 54 units QHS  (previously taking 56), and Bydureon 2 MG weekly.A1C in May 6.8%. She is followed by endocrinology and her last visit with them was 07/07/19, no labs obtained at that visit and no changes made on review of note.   Polydipsia/polyuria: no Visual disturbance: no Chest pain: no Paresthesias: no Glucose Monitoring: yes Accucheck frequency: BID Fasting glucose: 83 -106 Evening: 140 to 220 -- endorses snacking in evening Before meals: Taking Insulin?: yes Long acting insulin: 54 units Short acting insulin: Blood Pressure Monitoring: not checking Retinal Examination: Up to Date Foot Exam: Up to Date Pneumovax: Up to Date Influenza: Up to Date Aspirin: no  HYPERTENSION / HYPERLIPIDEMIA/HF Current medications include Lisinopril, Furosemide, Amlodipine, Atenolol for HTN + Lipitor for HLD. Followed by cardiology and last saw Dr. End3/24/21, no changes made.Is working on diet and trying to decrease her salty foods and pork skins -- she continues to report eating pork skins for snacks in evening, but has cut back on this - none in two weeks.   Satisfied with current treatment? yes Duration of hypertension: chronic BP monitoring frequency: not checking BP range:  BP medication side effects: no Duration of hyperlipidemia: chronic Cholesterol medication side effects: no Cholesterol supplements: none Medication compliance: good compliance Aspirin: no Recent stressors: no Recurrent  headaches:no Visual changes: no Palpitations: no Dyspnea: occasional, baseline at this time Chest pain: no Lower extremity edema: occasional -- when eats salty foods Dizzy/lightheaded: no  Relevant past medical, surgical, family and social history reviewed and updated as indicated. Interim medical history since our last visit reviewed. Allergies and medications reviewed and updated.  Review of Systems  Constitutional: Negative for activity change, appetite change, diaphoresis, fatigue and fever.  Respiratory: Positive for shortness of breath (at baseline, no worse). Negative for cough, chest tightness and wheezing.   Cardiovascular: Positive for leg swelling (at baseline, no worse). Negative for chest pain and palpitations.  Gastrointestinal: Negative.   Endocrine: Negative for polydipsia, polyphagia and polyuria.  Neurological: Negative.   Psychiatric/Behavioral: Negative.     Per HPI unless specifically indicated above     Objective:    BP 119/78   Pulse 74   Temp 98.2 F (36.8 C) (Oral)   Wt (!) 360 lb 6.4 oz (163.5 kg)   SpO2 93%   BMI 53.22 kg/m   Wt Readings from Last 3 Encounters:  09/21/19 (!) 360 lb 6.4 oz (163.5 kg)  09/20/19 (!) 369 lb (167.4 kg)  08/13/19 (!) 363 lb 6.4 oz (164.8 kg)    Physical Exam Vitals and nursing note reviewed.  Constitutional:      General: She is awake. She is not in acute distress.    Appearance: She is well-developed. She is morbidly obese. She is not ill-appearing.  HENT:     Head: Normocephalic.     Right Ear: Hearing normal.     Left Ear: Hearing normal.  Eyes:     General: Lids are normal.  Right eye: No discharge.        Left eye: No discharge.     Conjunctiva/sclera: Conjunctivae normal.     Pupils: Pupils are equal, round, and reactive to light.  Neck:     Thyroid: No thyromegaly.     Vascular: No carotid bruit.  Cardiovascular:     Rate and Rhythm: Normal rate and regular rhythm.     Heart sounds: Normal  heart sounds. No murmur heard.  No gallop.   Pulmonary:     Effort: Pulmonary effort is normal. No accessory muscle usage or respiratory distress.     Breath sounds: Normal breath sounds.     Comments: Lungs clear throughout. Abdominal:     General: Bowel sounds are normal.     Palpations: Abdomen is soft.  Musculoskeletal:     Cervical back: Normal range of motion and neck supple.     Right lower leg: Edema (trace) present.     Left lower leg: Edema (trace) present.  Skin:    General: Skin is warm and dry.  Neurological:     Mental Status: She is alert and oriented to person, place, and time.  Psychiatric:        Attention and Perception: Attention normal.        Mood and Affect: Mood normal.        Speech: Speech normal.        Behavior: Behavior normal. Behavior is cooperative.        Thought Content: Thought content normal.     Results for orders placed or performed in visit on 06/15/19  Bayer DCA Hb A1c Waived  Result Value Ref Range   HB A1C (BAYER DCA - WAIVED) 6.8 <7.0 %  Basic metabolic panel  Result Value Ref Range   Glucose 109 (H) 65 - 99 mg/dL   BUN 15 6 - 24 mg/dL   Creatinine, Ser 4.09 (H) 0.57 - 1.00 mg/dL   GFR calc non Af Amer 57 (L) >59 mL/min/1.73   GFR calc Af Amer 65 >59 mL/min/1.73   BUN/Creatinine Ratio 13 9 - 23   Sodium 144 134 - 144 mmol/L   Potassium 4.6 3.5 - 5.2 mmol/L   Chloride 106 96 - 106 mmol/L   CO2 25 20 - 29 mmol/L   Calcium 8.3 (L) 8.7 - 10.2 mg/dL      Assessment & Plan:   Problem List Items Addressed This Visit      Cardiovascular and Mediastinum   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  Continue current medication regimen and collaboration with cardiology.  Recommend checking BP three mornings a week at home + documenting for providers.  BMP today.  Recommend DASH diet focus and avoidance of high sodium foods, praised for  cutting back on pork skins.  Return in 3 months.      Relevant Orders   Basic metabolic panel   Chronic heart failure with preserved ejection fraction (HCC)    Chronic, ongoing with BP at goal.  Continue current medication regimen and collaboration with cardiology.  Praised for cutting back on pork skins.  Recommend: - Reminded to call for an overnight weight gain of >2 pounds or a weekly weight weight of >5 pounds - not adding salt to his food and has been reading food labels. Reviewed the importance of keeping daily sodium intake to 2000mg  daily -- NO PORK  SKINS        Endocrine   Hyperlipidemia associated with type 2 diabetes mellitus (HCC)    Chronic, ongoing with recent LDL 51 and TCHOL 100 at recent visit.  Continue current medication regimen as is at goal.  Lipid panel today.      Relevant Orders   Bayer DCA Hb A1c Waived   Lipid Panel w/o Chol/HDL Ratio   Type 2 diabetes mellitus treated with insulin (HCC) - Primary    Chronic, ongoing.  Followed by endo.  A1C today 6.6%, below goal, praised for ongoing success.  Continue collaboration with endocrinology.  Continue current medication regimen and adjust as needed.  Recommend she continue to monitor BS at home 2-3 times a day.  Return in 3 months.       Relevant Orders   Bayer DCA Hb A1c Waived     Other   Morbid obesity (HCC)    With T2DM and HF.  Recommended eating smaller high protein, low fat meals more frequently and exercising 30 mins a day 5 times a week with a goal of 10-15lb weight loss in the next 3 months. Patient voiced their understanding and motivation to adhere to these recommendations.       BMI 50.0-59.9, adult [facility])    Refer to morbid obesity plan of care.          Follow up plan: Return in about 3 months (around 12/22/2019) for T2DM, HTN/HLD, HF, CKD.

## 2019-09-21 NOTE — Assessment & Plan Note (Signed)
Chronic, ongoing.  Followed by endo.  A1C today 6.6%, below goal, praised for ongoing success.  Continue collaboration with endocrinology.  Continue current medication regimen and adjust as needed.  Recommend she continue to monitor BS at home 2-3 times a day.  Return in 3 months.

## 2019-09-21 NOTE — Addendum Note (Signed)
Addended by: Aura Dials T on: 09/21/2019 11:26 AM   Modules accepted: Orders

## 2019-09-21 NOTE — Patient Instructions (Signed)

## 2019-09-21 NOTE — Assessment & Plan Note (Signed)
Refer to morbid obesity plan of care. 

## 2019-09-21 NOTE — Assessment & Plan Note (Signed)
Chronic, ongoing with recent LDL 51 and TCHOL 100 at recent visit.  Continue current medication regimen as is at goal.  Lipid panel today.

## 2019-09-21 NOTE — Assessment & Plan Note (Signed)
With T2DM and HF.  Recommended eating smaller high protein, low fat meals more frequently and exercising 30 mins a day 5 times a week with a goal of 10-15lb weight loss in the next 3 months. Patient voiced their understanding and motivation to adhere to these recommendations.

## 2019-09-22 LAB — LIPID PANEL W/O CHOL/HDL RATIO
Cholesterol, Total: 126 mg/dL (ref 100–199)
HDL: 40 mg/dL (ref >39)
LDL Chol Calc (NIH): 74 mg/dL (ref 0–99)
Triglycerides: 52 mg/dL (ref 0–149)
VLDL Cholesterol Cal: 12 mg/dL (ref 5–40)

## 2019-09-22 LAB — BASIC METABOLIC PANEL
BUN/Creatinine Ratio: 15 (ref 9–23)
BUN: 18 mg/dL (ref 6–24)
CO2: 17 mmol/L — ABNORMAL LOW (ref 20–29)
Calcium: 8.1 mg/dL — ABNORMAL LOW (ref 8.7–10.2)
Chloride: 103 mmol/L (ref 96–106)
Creatinine, Ser: 1.2 mg/dL — ABNORMAL HIGH (ref 0.57–1.00)
GFR calc Af Amer: 61 mL/min/{1.73_m2} (ref >59)
GFR calc non Af Amer: 53 mL/min/{1.73_m2} — ABNORMAL LOW (ref >59)
Glucose: 120 mg/dL — ABNORMAL HIGH (ref 65–99)
Potassium: 4.9 mmol/L (ref 3.5–5.2)
Sodium: 139 mmol/L (ref 134–144)

## 2019-09-22 NOTE — Progress Notes (Signed)
Contacted via MyChart  Good morning Sara Zhang, your labs have returned.  Kidney function continues to have stable, chronic kidney disease stage 3 showing with no decline.  This is good, we like to ensure there is no decrease in function.  Calcium continues to be a little on low side, will recheck this next visit.  Try to get a little calcium in daily to help with what you lose via some of your medications.   Cholesterol levels are at goal, continue your daily statin.  Do you need any refills on anything?  Have a wonderful day!! Keep being awesome!!  Thank you for allowing me to participate in your care. Kindest regards, Lalia Loudon

## 2019-09-29 ENCOUNTER — Telehealth: Payer: Self-pay

## 2019-10-01 ENCOUNTER — Telehealth: Payer: Self-pay

## 2019-10-01 ENCOUNTER — Telehealth: Payer: Self-pay | Admitting: Pharmacist

## 2019-10-01 NOTE — Progress Notes (Signed)
  Chronic Care Management   Outreach Note  10/01/2019 Name: Sara Zhang MRN: [id number] DOB: 12/24/1970  Referred by: Marjie Skiff, NP Reason for referral : No chief complaint on file.   An unsuccessful telephone outreach was attempted today. The patient was referred to the pharmacist for assistance with care management and care coordination.  Left HIPAA compliant message  to return my call at her convenience.  Will coordinate with care guides for reschedule.Mer[place] Pod. Tiburcio Pea PharmD, BCPS Clinical Pharmacist [facility] [phone]

## 2019-10-20 ENCOUNTER — Encounter: Payer: Self-pay | Admitting: Podiatry

## 2019-10-20 ENCOUNTER — Other Ambulatory Visit: Payer: Self-pay

## 2019-10-20 ENCOUNTER — Ambulatory Visit (INDEPENDENT_AMBULATORY_CARE_PROVIDER_SITE_OTHER): Payer: Medicare Other | Admitting: Podiatry

## 2019-10-20 ENCOUNTER — Other Ambulatory Visit: Payer: Self-pay | Admitting: Nurse Practitioner

## 2019-10-20 DIAGNOSIS — Z794 Long term (current) use of insulin: Secondary | ICD-10-CM | POA: Diagnosis not present

## 2019-10-20 DIAGNOSIS — M79676 Pain in unspecified toe(s): Secondary | ICD-10-CM | POA: Diagnosis not present

## 2019-10-20 DIAGNOSIS — E1169 Type 2 diabetes mellitus with other specified complication: Secondary | ICD-10-CM | POA: Diagnosis not present

## 2019-10-20 DIAGNOSIS — E785 Hyperlipidemia, unspecified: Secondary | ICD-10-CM | POA: Diagnosis not present

## 2019-10-20 DIAGNOSIS — E1129 Type 2 diabetes mellitus with other diabetic kidney complication: Secondary | ICD-10-CM | POA: Diagnosis not present

## 2019-10-20 DIAGNOSIS — I152 Hypertension secondary to endocrine disorders: Secondary | ICD-10-CM | POA: Diagnosis not present

## 2019-10-20 DIAGNOSIS — E119 Type 2 diabetes mellitus without complications: Secondary | ICD-10-CM | POA: Diagnosis not present

## 2019-10-20 DIAGNOSIS — R809 Proteinuria, unspecified: Secondary | ICD-10-CM | POA: Diagnosis not present

## 2019-10-20 DIAGNOSIS — E1159 Type 2 diabetes mellitus with other circulatory complications: Secondary | ICD-10-CM | POA: Diagnosis not present

## 2019-10-20 DIAGNOSIS — E559 Vitamin D deficiency, unspecified: Secondary | ICD-10-CM | POA: Diagnosis not present

## 2019-10-20 DIAGNOSIS — B351 Tinea unguium: Secondary | ICD-10-CM | POA: Diagnosis not present

## 2019-10-20 NOTE — Progress Notes (Signed)
Subjective:  Patient ID: Sara Zhang, female    DOB: 1970/05/13,  MRN: [id number] HPI Chief Complaint  Patient presents with  . Diabetes    Patient presents today for Dm foot exam and ingrown toenail left hallux medial border x several weeks  She says its a little tender to touch now, but she has been trying to dig out the ingrown herself  . Nail Problem    49 y.o. female presents with the above complaint.   ROS: She denies fever chills nausea vomiting muscle aches pains calf pain back pain chest pain shortness of breath.  Past Medical History:  Diagnosis Date  . Abnormal uterine bleeding (AUB) 11/25/2016  . Chronic diastolic CHF (congestive heart failure) ([place])    a. 02/2012 Echo: EF 25-35%, glob HK; b. 06/2016 Echo: EF 55-60%, mild MR, nl PASP.  Marland Kitchen CKD (chronic kidney disease), stage II   . COVID-19   . Diabetes mellitus without complication ([place])   . Extreme obesity   . Hyperlipidemia   . Hypertension   . Hypoxia   . Microcytic anemia   . Pneumonia   . Sleep apnea   . Status post endometrial ablation 06/20/2016   2015 hysteroscopy/D&C with NovaSure endometrial ablation; pathology-benign endometrial polyps  . Thickened endometrium 06/20/2016   Ultrasound-22.3 mm endometrial stripe, heterogenous  . Vaginal bleeding    a. 01/2017 s/p hyteroscopy and polypectomy [facility]).  . Vitamin D deficiency    Past Surgical History:  Procedure Laterality Date  . CESAREAN SECTION    . CHOLECYSTECTOMY    . DILATION AND CURETTAGE, DIAGNOSTIC / THERAPEUTIC    . TONSILLECTOMY AND ADENOIDECTOMY    . TOOTH EXTRACTION  01/2016    Current Outpatient Medications:  .  amLODipine (NORVASC) 5 MG tablet, Take 1 tablet (5 mg total) by mouth daily., Disp: 30 tablet, Rfl: 3 .  atenolol (TENORMIN) 50 MG tablet, Take 1 tablet (50 mg total) by mouth daily., Disp: 90 tablet, Rfl: 4 .  atorvastatin (LIPITOR) 10 MG tablet, TAKE 1 TABLET(10 MG) BY MOUTH DAILY, Disp: 90 tablet, Rfl: 2 .  blood glucose meter  kit and supplies KIT, Dispense based on patient and insurance preference. Use up to four times daily as directed. (FOR ICD-9 250.00, 250.01)., Disp: 1 each, Rfl: 0 .  BYDUREON BCISE 2 MG/0.85ML AUIJ, INJECT 2 MG UNDER THE SKIN EVERY WEEK, Disp: 34 mL, Rfl: 0 .  Calcium Carbonate-Vitamin D (OYSTER SHELL CALCIUM 500 + D) 500-125 MG-UNIT TABS, Take 1 tablet by mouth daily. , Disp: , Rfl:  .  Cholecalciferol (VITAMIN D3) 10 MCG (400 UNIT) tablet, Take 400 Units by mouth daily., Disp: , Rfl:  .  furosemide (LASIX) 40 MG tablet, Take 1 tablet (40 mg total) by mouth 2 (two) times daily., Disp: 180 tablet, Rfl: 3 .  glucose blood (CONTOUR NEXT TEST) test strip, TEST BLOOD SUGAR ONCE DAILY, Disp: 100 each, Rfl: 2 .  Insulin Glargine (TOUJEO SOLOSTAR) 300 UNIT/ML SOPN, Inject 54 Units into the skin at bedtime., Disp: 6 pen, Rfl: 6 .  Insulin Pen Needle 32G X 4 MM MISC, 1 Units by Does not apply route every morning. Pen needles, Disp: 90 each, Rfl: 3 .  JARDIANCE 25 MG TABS tablet, Take 25 mg by mouth daily., Disp: , Rfl:  .  levonorgestrel (MIRENA) 20 MCG/24HR IUD, 1 each by Intrauterine route once., Disp: , Rfl:  .  lisinopril (ZESTRIL) 40 MG tablet, TAKE 1 TABLET BY MOUTH EVERY DAY, Disp: 90 tablet, Rfl:  1 .  OXYGEN, Inhale 2 L into the lungs daily., Disp: , Rfl:   No Known Allergies Review of Systems Objective:  There were no vitals filed for this visit.  General: Well developed, nourished, in no acute distress, alert and oriented x3   Dermatological: Skin is warm, dry and supple bilateral. Nails x 10 are slightly elongated and the hallux nails are sharply incurvated.  Mildly tender on palpation there is no erythema edema cellulitis drainage or odor no signs of infection.; remaining integument appears unremarkable at this time. There are no open sores, no preulcerative lesions, no rash or signs of infection present.  Vascular: Dorsalis Pedis artery and Posterior Tibial artery pedal pulses are 2/4  bilateral with immedate capillary fill time. Pedal hair growth present. No varicosities and no lower extremity edema present bilateral.   Neruologic: Grossly intact via light touch bilateral. Vibratory intact via tuning fork bilateral. Protective threshold with Semmes Wienstein monofilament intact to all pedal sites bilateral. Patellar and Achilles deep tendon reflexes 2+ bilateral. No Babinski or clonus noted bilateral.   Musculoskeletal: No gross boney pedal deformities bilateral. No pain, crepitus, or limitation noted with foot and ankle range of motion bilateral. Muscular strength 5/5 in all groups tested bilateral.  Gait: Unassisted, Nonantalgic.    Radiographs:  None taken  Assessment & Plan:   Assessment: Diabetes mellitus noncomplicated at this point chief complaint of painful elongated toenails pain in limb secondary to onychomycosis.  Plan: Debridement of toenails bilateral.     Jamarie Joplin T. [place], [place]

## 2019-10-21 NOTE — Telephone Encounter (Signed)
Pt has been r/s  

## 2019-10-27 ENCOUNTER — Ambulatory Visit: Payer: Medicare Other | Admitting: Internal Medicine

## 2019-10-27 DIAGNOSIS — I152 Hypertension secondary to endocrine disorders: Secondary | ICD-10-CM | POA: Diagnosis not present

## 2019-10-27 DIAGNOSIS — E1159 Type 2 diabetes mellitus with other circulatory complications: Secondary | ICD-10-CM | POA: Diagnosis not present

## 2019-10-27 DIAGNOSIS — Z0100 Encounter for examination of eyes and vision without abnormal findings: Secondary | ICD-10-CM | POA: Diagnosis not present

## 2019-10-27 DIAGNOSIS — E785 Hyperlipidemia, unspecified: Secondary | ICD-10-CM | POA: Diagnosis not present

## 2019-10-27 DIAGNOSIS — Z6841 Body Mass Index (BMI) 40.0 and over, adult: Secondary | ICD-10-CM | POA: Diagnosis not present

## 2019-10-27 DIAGNOSIS — E559 Vitamin D deficiency, unspecified: Secondary | ICD-10-CM | POA: Diagnosis not present

## 2019-10-27 DIAGNOSIS — E1129 Type 2 diabetes mellitus with other diabetic kidney complication: Secondary | ICD-10-CM | POA: Diagnosis not present

## 2019-10-27 DIAGNOSIS — Z794 Long term (current) use of insulin: Secondary | ICD-10-CM | POA: Diagnosis not present

## 2019-10-27 DIAGNOSIS — R809 Proteinuria, unspecified: Secondary | ICD-10-CM | POA: Diagnosis not present

## 2019-10-27 DIAGNOSIS — E1169 Type 2 diabetes mellitus with other specified complication: Secondary | ICD-10-CM | POA: Diagnosis not present

## 2019-10-27 DIAGNOSIS — E119 Type 2 diabetes mellitus without complications: Secondary | ICD-10-CM | POA: Diagnosis not present

## 2019-10-28 ENCOUNTER — Other Ambulatory Visit: Payer: Self-pay | Admitting: Internal Medicine

## 2019-11-08 ENCOUNTER — Ambulatory Visit (INDEPENDENT_AMBULATORY_CARE_PROVIDER_SITE_OTHER): Payer: Medicare Other | Admitting: Internal Medicine

## 2019-11-08 ENCOUNTER — Encounter: Payer: Self-pay | Admitting: Internal Medicine

## 2019-11-08 ENCOUNTER — Other Ambulatory Visit: Payer: Self-pay

## 2019-11-08 VITALS — BP 110/78 | HR 81 | Ht 68.5 in | Wt 352.4 lb

## 2019-11-08 DIAGNOSIS — I10 Essential (primary) hypertension: Secondary | ICD-10-CM | POA: Diagnosis not present

## 2019-11-08 DIAGNOSIS — I5032 Chronic diastolic (congestive) heart failure: Secondary | ICD-10-CM

## 2019-11-08 NOTE — Progress Notes (Signed)
Follow-up Outpatient Visit Date: 11/08/2019  Primary Care Provider: Venita Lick, NP [place] [address]  Chief Complaint: Follow-up heart failure  HPI:  Sara Zhang is a 49 y.o. female with history of heart failure (LVEF reportedly 25-35% in 2014 with subsequent normalization), HTN, HLD,OSA on CPAP, COVID-19 in 01/2019,and type II DM, who presents for follow-up of heart failure.  I last saw Sara Zhang in March, at which time she noted transient leg edema with dietary indiscretion.  She was otherwise feeling well and reported improved blood pressure control with addition of amlodipine.  She had almost completely recovered from COVID-19 infection late last year.  We did not make any medication changes at that time.  Today, Sara Zhang reports that she is doing fairly well.  She has switched from regular to diet sodas and effort to lose weight.  She is also trying to move a little bit more, though she is still largely sedentary.  She feels well, denying chest pain, shortness of breath, palpitations, lightheadedness, and edema.  She has stable two-pillow orthopnea.  She is wearing CPAP nightly.  She also uses supplemental oxygen with activity, but hopes to come off of this soon.  --------------------------------------------------------------------------------------------------  Cardiovascular History & Procedures: Cardiovascular Problems:  Chronic systolic heart failure with normalization of LVEF  Risk Factors:  Hypertension, hyperlipidemia, diabetes mellitus, and obesity  Cath/PCI:  None  CV Surgery:  None  EP Procedures and Devices:  None  Non-Invasive Evaluation(s):  TTE (11/23/2018): Normal LV size with mild LVH. LVEF 55-60% with normal diastolic function. Normal RV size and function. Mild biatrial enlargement. No significant valvular abnormality.  TTE (06/10/16): Normal LV size and function with LVEF of 55-60%. Normal diastolic parameters.  Mild MR. Normal RV size and function. Normal pulmonary artery pressure.  Report of LVEF 25-35% with global hypokinesis by outside echo on her before 03/04/12.  Recent CV Pertinent Labs: Lab Results  Component Value Date   CHOL 126 09/21/2019   CHOL 100 04/01/2019   HDL 40 09/21/2019   LDLCALC 74 09/21/2019   TRIG 52 09/21/2019   TRIG 66 04/01/2019   BNP 27.9 02/03/2019   K 4.9 09/21/2019   K 4.2 09/25/2013   MG 2.1 02/07/2019   BUN 18 09/21/2019   BUN 12 09/25/2013   CREATININE 1.20 (H) 09/21/2019   CREATININE 0.75 09/25/2013    Past medical and surgical history were reviewed and updated in EPIC.  Current Meds  Medication Sig  . amLODipine (NORVASC) 5 MG tablet Take 1 tablet (5 mg total) by mouth daily.  Marland Kitchen atenolol (TENORMIN) 50 MG tablet Take 1 tablet (50 mg total) by mouth daily.  Marland Kitchen atorvastatin (LIPITOR) 10 MG tablet TAKE 1 TABLET(10 MG) BY MOUTH DAILY  . blood glucose meter kit and supplies KIT Dispense based on patient and insurance preference. Use up to four times daily as directed. (FOR ICD-9 250.00, 250.01).  Marland Kitchen BYDUREON BCISE 2 MG/0.85ML AUIJ INJECT 2 MG UNDER THE SKIN EVERY WEEK  . Calcium Carbonate-Vitamin D (OYSTER SHELL CALCIUM 500 + D) 500-125 MG-UNIT TABS Take 1 tablet by mouth daily.   . Cholecalciferol (VITAMIN D3) 10 MCG (400 UNIT) tablet Take 400 Units by mouth daily.  . furosemide (LASIX) 40 MG tablet Take 1 tablet (40 mg total) by mouth 2 (two) times daily.  Marland Kitchen glucose blood (CONTOUR NEXT TEST) test strip TEST BLOOD SUGAR ONCE DAILY  . Insulin Glargine (TOUJEO SOLOSTAR) 300 UNIT/ML SOPN Inject 54 Units into the  skin at bedtime.  . Insulin Pen Needle 32G X 4 MM MISC 1 Units by Does not apply route every morning. Pen needles  . JARDIANCE 25 MG TABS tablet Take 25 mg by mouth daily.  Marland Kitchen levonorgestrel (MIRENA) 20 MCG/24HR IUD 1 each by Intrauterine route once.   Marland Kitchen lisinopril (ZESTRIL) 40 MG tablet TAKE 1 TABLET BY MOUTH EVERY DAY  . OXYGEN Inhale 2 L into the  lungs daily.    Allergies: Patient has no known allergies.  Social History   Tobacco Use  . Smoking status: Never Smoker  . Smokeless tobacco: Never Used  Vaping Use  . Vaping Use: Never used  Substance Use Topics  . Alcohol use: Yes    Alcohol/week: 0.0 standard drinks    Comment: 1-2 beers/month  . Drug use: No    Family History  Problem Relation Age of Onset  . Diabetes Mother   . Hyperlipidemia Mother   . Hypertension Mother   . Stroke Mother   . Arthritis Father   . Asthma Father   . Diabetes Father   . Hypertension Father   . Hypertension Brother   . Diabetes Brother   . Diabetes Maternal Grandmother   . Hypertension Maternal Grandmother   . Diabetes Maternal Grandfather   . Hypertension Maternal Grandfather   . Diabetes Paternal Grandmother   . Hypertension Paternal Grandmother   . Heart attack Maternal Uncle   . Breast cancer Neg Hx     Review of Systems: A 12-system review of systems was performed and was negative except as noted in the HPI.  --------------------------------------------------------------------------------------------------  Physical Exam: BP 110/78 (BP Location: Left Arm, Patient Position: Sitting, Cuff Size: Large)   Pulse 81   Ht 5' 8.5" (1.74 m)   Wt (!) 352 lb 6 oz (159.8 kg)   SpO2 98%   BMI 52.80 kg/m   General: NAD. Neck: No JVD or HJR, though body habitus limits evaluation. Lungs: Clear to auscultation without wheezes or crackles. Heart: Distant heart sounds.  Regular rate and rhythm without murmurs. Abdomen: Soft, nontender, nondistended. Extremities: No lower extremity edema.  EKG: Normal sinus rhythm without significant abnormality.  Lab Results  Component Value Date   WBC 8.4 04/01/2019   HGB 11.2 04/01/2019   HCT 37.1 04/01/2019   MCV 84 04/01/2019   PLT 362 04/01/2019    Lab Results  Component Value Date   NA 139 09/21/2019   K 4.9 09/21/2019   CL 103 09/21/2019   CO2 17 (L) 09/21/2019   BUN 18  09/21/2019   CREATININE 1.20 (H) 09/21/2019   GLUCOSE 120 (H) 09/21/2019   ALT 7 04/01/2019    Lab Results  Component Value Date   CHOL 126 09/21/2019   HDL 40 09/21/2019   LDLCALC 74 09/21/2019   TRIG 52 09/21/2019    --------------------------------------------------------------------------------------------------  ASSESSMENT AND PLAN: Chronic HFpEF: Sara Zhang appears grossly euvolemic with stable NYHA class II symptoms.  We will continue her current medications.  Labs in mid August showed stable potassium and creatinine.  Hypertension: Blood pressure well controlled today.  No medication changes.  Morbid obesity: I congratulated Sara Zhang on her modest weight loss and encouraged her to continue working on diet and exercise to help lose weight.  Follow-up: Return to clinic in 1 year.  Nelva Bush, MD 11/09/2019 9:13 PM

## 2019-11-08 NOTE — Patient Instructions (Signed)
Medication Instructions:  Your physician recommends that you continue on your current medications as directed. Please refer to the Current Medication list given to you today.  *If you need a refill on your cardiac medications before your next appointment, please call your pharmacy*  Follow-Up: At [facility], you and your health needs are our priority.  As part of our continuing mission to provide you with exceptional heart care, we have created designated Provider Care Teams.  These Care Teams include your primary Cardiologist (physician) and Advanced Practice Providers (APPs -  Physician Assistants and Nurse Practitioners) who all work together to provide you with the care you need, when you need it.  We recommend signing up for the patient portal called "MyChart".  Sign up information is provided on this After Visit Summary.  MyChart is used to connect with patients for Virtual Visits (Telemedicine).  Patients are able to view lab/test results, encounter notes, upcoming appointments, etc.  Non-urgent messages can be sent to your provider as well.   To learn more about what you can do with MyChart, go to [email or web address].    Your next appointment:   12 month(s)  The format for your next appointment:   In Person  Provider:   You may see Christopher End, MD or one of the following Advanced Practice Providers on your designated Care Team:    Christopher Berge, NP  Ryan Dunn, PA-C  Jacquelyn Visser, PA-C  Cadence Furth, PA-C  

## 2019-11-09 ENCOUNTER — Encounter: Payer: Self-pay | Admitting: Internal Medicine

## 2019-11-11 ENCOUNTER — Other Ambulatory Visit: Payer: Self-pay | Admitting: Internal Medicine

## 2019-11-25 DIAGNOSIS — E119 Type 2 diabetes mellitus without complications: Secondary | ICD-10-CM | POA: Diagnosis not present

## 2019-11-25 DIAGNOSIS — H40003 Preglaucoma, unspecified, bilateral: Secondary | ICD-10-CM | POA: Diagnosis not present

## 2019-11-25 LAB — HM DIABETES EYE EXAM

## 2019-11-30 ENCOUNTER — Encounter: Payer: Self-pay | Admitting: Nurse Practitioner

## 2019-12-08 ENCOUNTER — Telehealth: Payer: Medicare Other

## 2019-12-08 NOTE — Chronic Care Management (AMB) (Deleted)
Chronic Care Management   Follow Up Note   12/08/2019 Name: Sara Zhang MRN: [id number] DOB: 05-Jul-1970  Referred by: Venita Lick, NP Reason for referral : No chief complaint on file.   Sara Zhang is a 49 y.o. year old female who is a primary care patient of Cannady, Barbaraann Faster, NP. The CCM team was consulted for assistance with chronic disease management and care coordination needs.    Contacted patient for medication management review.   Review of patient status, including review of consultants reports, relevant laboratory and other test results, and collaboration with appropriate care team members and the patient's provider was performed as part of comprehensive patient evaluation and provision of chronic care management services.    SDOH (Social Determinants of Health) assessments performed: No See Care Plan activities for detailed interventions related to [place])     Consult visits: Renae Gloss, NP- endocrinology - continue meds bydureon, Toujeo, Jardiance  Outpatient Encounter Medications as of 12/08/2019  Medication Sig  . amLODipine (NORVASC) 5 MG tablet TAKE 1 TABLET(5 MG) BY MOUTH DAILY  . atenolol (TENORMIN) 50 MG tablet Take 1 tablet (50 mg total) by mouth daily.  Marland Kitchen atorvastatin (LIPITOR) 10 MG tablet TAKE 1 TABLET(10 MG) BY MOUTH DAILY  . blood glucose meter kit and supplies KIT Dispense based on patient and insurance preference. Use up to four times daily as directed. (FOR ICD-9 250.00, 250.01).  Marland Kitchen BYDUREON BCISE 2 MG/0.85ML AUIJ INJECT 2 MG UNDER THE SKIN EVERY WEEK  . Calcium Carbonate-Vitamin D (OYSTER SHELL CALCIUM 500 + D) 500-125 MG-UNIT TABS Take 1 tablet by mouth daily.   . Cholecalciferol (VITAMIN D3) 10 MCG (400 UNIT) tablet Take 400 Units by mouth daily.  . furosemide (LASIX) 40 MG tablet Take 1 tablet (40 mg total) by mouth 2 (two) times daily.  Marland Kitchen glucose blood (CONTOUR NEXT TEST) test strip TEST BLOOD SUGAR ONCE DAILY  . Insulin Glargine (TOUJEO  SOLOSTAR) 300 UNIT/ML SOPN Inject 54 Units into the skin at bedtime.  . Insulin Pen Needle 32G X 4 MM MISC 1 Units by Does not apply route every morning. Pen needles  . JARDIANCE 25 MG TABS tablet Take 25 mg by mouth daily.  Marland Kitchen levonorgestrel (MIRENA) 20 MCG/24HR IUD 1 each by Intrauterine route once.   Marland Kitchen lisinopril (ZESTRIL) 40 MG tablet TAKE 1 TABLET BY MOUTH EVERY DAY  . OXYGEN Inhale 2 L into the lungs daily.   No facility-administered encounter medications on file as of 12/08/2019.     Objective:   Goals Addressed   None     Diabetes   A1c goal {A1c goals:23924}  Recent Relevant Labs: Lab Results  Component Value Date/Time   HGBA1C 6.6 09/21/2019 09:08 AM   HGBA1C 6.8 06/15/2019 08:20 AM   MICROALBUR 80 (H) 04/01/2019 08:05 AM   MICROALBUR 150 (H) 02/24/2018 09:51 AM    Last diabetic Eye exam:  Lab Results  Component Value Date/Time   HMDIABEYEEXA No Retinopathy 11/25/2019 12:00 AM    Last diabetic Foot exam: No results found for: HMDIABFOOTEX   Checking BG: {CHL HP Blood Glucose Monitoring [phone]}  Recent FBG Readings: *** Recent pre-meal BG readings: *** Recent 2hr PP BG readings:  *** Recent HS BG readings: ***  Patient has failed these meds in past: *** Patient is currently {CHL Controlled/Uncontrolled:[phone]} on the following medications: . ***  We discussed: {CHL HP Upstream Pharmacy [phone]}  Plan  Continue {CHL HP Upstream Pharmacy FHLKT:[id number]}  Plan:  Junita Push. Kenton Kingfisher PharmD, [facility] [phone]

## 2019-12-22 ENCOUNTER — Other Ambulatory Visit: Payer: Self-pay

## 2019-12-22 ENCOUNTER — Ambulatory Visit (INDEPENDENT_AMBULATORY_CARE_PROVIDER_SITE_OTHER): Payer: Medicare Other | Admitting: Nurse Practitioner

## 2019-12-22 ENCOUNTER — Encounter: Payer: Self-pay | Admitting: Nurse Practitioner

## 2019-12-22 VITALS — BP 134/80 | HR 80 | Temp 98.7°F | Wt 356.8 lb

## 2019-12-22 DIAGNOSIS — Z794 Long term (current) use of insulin: Secondary | ICD-10-CM | POA: Diagnosis not present

## 2019-12-22 DIAGNOSIS — Z1211 Encounter for screening for malignant neoplasm of colon: Secondary | ICD-10-CM

## 2019-12-22 DIAGNOSIS — E785 Hyperlipidemia, unspecified: Secondary | ICD-10-CM | POA: Diagnosis not present

## 2019-12-22 DIAGNOSIS — G4733 Obstructive sleep apnea (adult) (pediatric): Secondary | ICD-10-CM

## 2019-12-22 DIAGNOSIS — Z6841 Body Mass Index (BMI) 40.0 and over, adult: Secondary | ICD-10-CM

## 2019-12-22 DIAGNOSIS — I5032 Chronic diastolic (congestive) heart failure: Secondary | ICD-10-CM

## 2019-12-22 DIAGNOSIS — E1169 Type 2 diabetes mellitus with other specified complication: Secondary | ICD-10-CM

## 2019-12-22 DIAGNOSIS — I130 Hypertensive heart and chronic kidney disease with heart failure and stage 1 through stage 4 chronic kidney disease, or unspecified chronic kidney disease: Secondary | ICD-10-CM

## 2019-12-22 DIAGNOSIS — E119 Type 2 diabetes mellitus without complications: Secondary | ICD-10-CM

## 2019-12-22 DIAGNOSIS — N182 Chronic kidney disease, stage 2 (mild): Secondary | ICD-10-CM

## 2019-12-22 MED ORDER — BENZONATATE 100 MG PO CAPS: 100.0000 mg | ORAL_CAPSULE | Freq: Three times a day (TID) | ORAL | 0 refills | Status: DC | PRN

## 2019-12-22 MED ORDER — CONTOUR NEXT MONITOR W/DEVICE KIT: PACK | 0 refills | Status: AC

## 2019-12-22 MED ORDER — JARDIANCE 25 MG PO TABS: 25.0000 mg | ORAL_TABLET | Freq: Every day | ORAL | 4 refills | Status: DC

## 2019-12-22 NOTE — Progress Notes (Addendum)
BP 134/80   Pulse 80   Temp 98.7 F (37.1 C)   Wt (!) 356 lb 12.8 oz (161.8 kg)   SpO2 91%   BMI 53.46 kg/m    Subjective:    Patient ID: Sara Zhang, female    DOB: 1970-08-15, 49 y.o.   MRN: [id number]  HPI: Sara Zhang is a 49 y.o. female  Chief Complaint  Patient presents with  . Diabetes  . Hypertension   DIABETES Current medications include Jardiance 25 MG, Toujeo 56 units QHS, and Bydureon 2 MG weekly.A1C in September 7.2%. She is followed by endocrinology and her last visit with them was 10/27/19, no changes made on review of note.   Polydipsia/polyuria: no Visual disturbance: no Chest pain: no Paresthesias: no Glucose Monitoring: yes Accucheck frequency: machine broke Fasting glucose:  Evening:  Before meals: Taking Insulin?: yes Long acting insulin: 56 units Short acting insulin: Blood Pressure Monitoring: not checking Retinal Examination: Up to Date Foot Exam: Up to Date Pneumovax: Up to Date Influenza: Not Up to Date Aspirin: no  HYPERTENSION / HYPERLIPIDEMIA/HF Current medications include Lisinopril, Furosemide, Amlodipine, Atenolol for HTN + Lipitor for HLD. Followed by cardiology and last saw Dr. End10/4/21, no changes made.Is working on diet and has cut way back on pork skins.  Last echo in 2020 October was 55-60% Satisfied with current treatment? yes Duration of hypertension: chronic BP monitoring frequency: not checking BP range:  BP medication side effects: no Duration of hyperlipidemia: chronic Cholesterol medication side effects: no Cholesterol supplements: none Medication compliance: good compliance Aspirin: no Recent stressors: no Recurrent headaches:no Visual changes: no Palpitations: no Dyspnea: occasional, baseline at this time -- does have a cough leftover from recent URI Chest pain: no Lower extremity edema: occasional -- when eats salty  foods Dizzy/lightheaded: no  Relevant past medical, surgical, family and social history reviewed and updated as indicated. Interim medical history since our last visit reviewed. Allergies and medications reviewed and updated.  Review of Systems  Constitutional: Negative for activity change, appetite change, diaphoresis, fatigue and fever.  Respiratory: Positive for shortness of breath (at baseline, no worse). Negative for cough, chest tightness and wheezing.   Cardiovascular: Positive for leg swelling (at baseline, no worse). Negative for chest pain and palpitations.  Gastrointestinal: Negative.   Endocrine: Negative for polydipsia, polyphagia and polyuria.  Neurological: Negative.   Psychiatric/Behavioral: Negative.     Per HPI unless specifically indicated above     Objective:    BP 134/80   Pulse 80   Temp 98.7 F (37.1 C)   Wt (!) 356 lb 12.8 oz (161.8 kg)   SpO2 91%   BMI 53.46 kg/m   Wt Readings from Last 3 Encounters:  12/22/19 (!) 356 lb 12.8 oz (161.8 kg)  11/08/19 (!) 352 lb 6 oz (159.8 kg)  09/21/19 (!) 360 lb 6.4 oz (163.5 kg)    Physical Exam Vitals and nursing note reviewed.  Constitutional:      General: She is awake. She is not in acute distress.    Appearance: She is well-developed. She is morbidly obese. She is not ill-appearing.  HENT:     Head: Normocephalic.     Right Ear: Hearing normal.     Left Ear: Hearing normal.  Eyes:     General: Lids are normal.        Right eye: No discharge.        Left eye: No discharge.     Conjunctiva/sclera: Conjunctivae normal.  Pupils: Pupils are equal, round, and reactive to light.  Neck:     Thyroid: No thyromegaly.     Vascular: No carotid bruit.  Cardiovascular:     Rate and Rhythm: Normal rate and regular rhythm.     Heart sounds: Normal heart sounds. No murmur heard.  No gallop.   Pulmonary:     Effort: Pulmonary effort is normal. No accessory muscle usage or respiratory distress.     Breath  sounds: Normal breath sounds.     Comments: Lungs clear throughout. Abdominal:     General: Bowel sounds are normal.     Palpations: Abdomen is soft.  Musculoskeletal:     Cervical back: Normal range of motion and neck supple.     Right lower leg: Edema (trace) present.     Left lower leg: Edema (trace) present.  Skin:    General: Skin is warm and dry.  Neurological:     Mental Status: She is alert and oriented to person, place, and time.  Psychiatric:        Attention and Perception: Attention normal.        Mood and Affect: Mood normal.        Speech: Speech normal.        Behavior: Behavior normal. Behavior is cooperative.        Thought Content: Thought content normal.    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  Continue current medication regimen and collaboration with cardiology.  Recommend checking BP three mornings a week at home + documenting for providers.  BMP today.  Recommend DASH diet focus and avoidance of high sodium foods, praised for cutting back on pork skins.  Return in 3 months.      Chronic heart failure with preserved ejection fraction (HCC)    Chronic, ongoing with BP at goal.  Continue current medication regimen and collaboration with cardiology.  Praised for cutting back on pork  skins.  Recommend: - Reminded to call for an overnight weight gain of >2 pounds or a weekly weight weight of >5 pounds - not adding salt to his food and has been reading food labels. Reviewed the importance of keeping daily sodium intake to 2000mg  daily -- NO PORK SKINS        Respiratory   Obstructive sleep apnea    Continue consistent use of CPAP every night.        Endocrine   Hyperlipidemia associated with type 2 diabetes mellitus (HCC)    Chronic, ongoing with recent LDL 74 and TCHOL 126 at recent visit.  Continue current medication regimen as is at goal.  Lipid panel today.      Relevant Medications   JARDIANCE 25 MG TABS tablet   Other Relevant Orders   Lipid Panel  w/o Chol/HDL Ratio   Type 2 diabetes mellitus treated with insulin (HCC) - Primary    Chronic, ongoing.  Followed by endo.  A1C 7.2% with endo recently, praised for ongoing success.  Continue collaboration with endocrinology.  Continue current medication regimen and adjust as needed.  Recommend she continue to monitor BS at home 2-3 times a day.  New glucometer sent and refills on Jardiance.  Return in 3 months.       Relevant Medications   JARDIANCE 25 MG TABS tablet   Other Relevant Orders   Basic metabolic panel     Other   Morbid obesity (HCC)    With T2DM and HF.  Recommended eating smaller high protein, low fat meals more frequently and exercising 30 mins a day 5 times a week with a goal of 10-15lb weight loss in the next 3 months. Patient voiced their understanding and motivation to adhere to these recommendations.       Relevant Medications   JARDIANCE 25 MG TABS tablet   BMI 50.0-59.9, adult (HCC)    Refer to morbid obesity plan of care.      Relevant Medications   JARDIANCE 25 MG TABS tablet    Other Visit Diagnoses    Colon cancer screening       GI referral placed.   Relevant Orders   Ambulatory referral to Gastroenterology       Follow up plan: Return in about 3 months (around  03/23/2020) for T2DM, HTN/HLD, CKD.

## 2019-12-22 NOTE — Assessment & Plan Note (Signed)
Chronic, ongoing with BP at goal today on recheck.  Continue current medication regimen and collaboration with cardiology.  Recommend checking BP three mornings a week at home + documenting for providers.  BMP today.  Recommend DASH diet focus and avoidance of high sodium foods, praised for cutting back on pork skins.  Return in 3 months.

## 2019-12-22 NOTE — Assessment & Plan Note (Addendum)
Chronic, ongoing with recent LDL 74 and TCHOL 126 at recent visit.  Continue current medication regimen as is at goal.  Lipid panel today.

## 2019-12-22 NOTE — Patient Instructions (Signed)

## 2019-12-22 NOTE — Assessment & Plan Note (Signed)
Chronic, ongoing.  Followed by endo.  A1C 7.2% with endo recently, praised for ongoing success.  Continue collaboration with endocrinology.  Continue current medication regimen and adjust as needed.  Recommend she continue to monitor BS at home 2-3 times a day.  New glucometer sent and refills on Jardiance.  Return in 3 months.

## 2019-12-22 NOTE — Assessment & Plan Note (Signed)
Continue consistent use of CPAP every night. 

## 2019-12-22 NOTE — Assessment & Plan Note (Signed)
Chronic, ongoing with BP at goal.  Continue current medication regimen and collaboration with cardiology.  Praised for cutting back on pork skins.  Recommend: - Reminded to call for an overnight weight gain of >2 pounds or a weekly weight weight of >5 pounds - not adding salt to his food and has been reading food labels. Reviewed the importance of keeping daily sodium intake to 2000mg  daily -- NO PORK SKINS

## 2019-12-22 NOTE — Assessment & Plan Note (Signed)
Refer to morbid obesity plan of care. 

## 2019-12-22 NOTE — Assessment & Plan Note (Signed)
With T2DM and HF.  Recommended eating smaller high protein, low fat meals more frequently and exercising 30 mins a day 5 times a week with a goal of 10-15lb weight loss in the next 3 months. Patient voiced their understanding and motivation to adhere to these recommendations.

## 2019-12-23 LAB — BASIC METABOLIC PANEL
BUN/Creatinine Ratio: 10 (ref 9–23)
BUN: 10 mg/dL (ref 6–24)
CO2: 27 mmol/L (ref 20–29)
Calcium: 8.3 mg/dL — ABNORMAL LOW (ref 8.7–10.2)
Chloride: 99 mmol/L (ref 96–106)
Creatinine, Ser: 0.96 mg/dL (ref 0.57–1.00)
GFR calc Af Amer: 79 mL/min/{1.73_m2} (ref >59)
GFR calc non Af Amer: 69 mL/min/{1.73_m2} (ref >59)
Glucose: 173 mg/dL — ABNORMAL HIGH (ref 65–99)
Potassium: 4 mmol/L (ref 3.5–5.2)
Sodium: 142 mmol/L (ref 134–144)

## 2019-12-23 LAB — LIPID PANEL W/O CHOL/HDL RATIO
Cholesterol, Total: 131 mg/dL (ref 100–199)
HDL: 42 mg/dL (ref >39)
LDL Chol Calc (NIH): 77 mg/dL (ref 0–99)
Triglycerides: 55 mg/dL (ref 0–149)
VLDL Cholesterol Cal: 12 mg/dL (ref 5–40)

## 2019-12-23 NOTE — Progress Notes (Signed)
Contacted via MyChart  Good morning [place], your labs have returned and overall kidney function is improved this check  Continue current medications and good fluid intake + cutting back on pork skins.  Cholesterol levels remain stable.  No changes needed.  Have a wonderful day!! Keep being awesome!!  Thank you for allowing me to participate in your care. Kindest regards, Tariyah Pendry

## 2019-12-28 ENCOUNTER — Telehealth (INDEPENDENT_AMBULATORY_CARE_PROVIDER_SITE_OTHER): Payer: Self-pay | Admitting: Gastroenterology

## 2019-12-28 ENCOUNTER — Other Ambulatory Visit: Payer: Self-pay

## 2019-12-28 DIAGNOSIS — Z1211 Encounter for screening for malignant neoplasm of colon: Secondary | ICD-10-CM

## 2019-12-28 MED ORDER — NA SULFATE-K SULFATE-MG SULF 17.5-3.13-1.6 GM/177ML PO SOLN: 1.0000 | Freq: Once | ORAL | 0 refills | Status: AC

## 2019-12-28 NOTE — Progress Notes (Signed)
Gastroenterology Pre-Procedure Review  Request Date: Tuesday 01/11/20 Requesting Physician: Dr. Vicente Males  PATIENT REVIEW QUESTIONS: The patient responded to the following health history questions as indicated:    1. Are you having any GI issues? no 2. Do you have a personal history of Polyps? no 3. Do you have a family history of Colon Cancer or Polyps? no 4. Diabetes Mellitus? yes (type 2) 5. Joint replacements in the past 12 months?no 6. Major health problems in the past 3 months?no 7. Any artificial heart valves, MVP, or defibrillator?no    MEDICATIONS & ALLERGIES:    Patient reports the following regarding taking any anticoagulation/antiplatelet therapy:   Plavix, Coumadin, Eliquis, Xarelto, Lovenox, Pradaxa, Brilinta, or Effient? no Aspirin? no  Patient confirms/reports the following medications:  Current Outpatient Medications  Medication Sig Dispense Refill  . amLODipine (NORVASC) 5 MG tablet TAKE 1 TABLET(5 MG) BY MOUTH DAILY 30 tablet 3  . atenolol (TENORMIN) 50 MG tablet Take 1 tablet (50 mg total) by mouth daily. 90 tablet 4  . atorvastatin (LIPITOR) 10 MG tablet TAKE 1 TABLET(10 MG) BY MOUTH DAILY 90 tablet 2  . benzonatate (TESSALON PERLES) 100 MG capsule Take 1 capsule (100 mg total) by mouth 3 (three) times daily as needed for cough. 20 capsule 0  . blood glucose meter kit and supplies KIT Dispense based on patient and insurance preference. Use up to four times daily as directed. (FOR ICD-9 250.00, 250.01). 1 each 0  . Blood Glucose Monitoring Suppl (CONTOUR NEXT MONITOR) w/Device KIT To check blood sugar 4 times daily. 1 kit 0  . BYDUREON BCISE 2 MG/0.85ML AUIJ INJECT 2 MG UNDER THE SKIN EVERY WEEK 34 mL 0  . Calcium Carbonate-Vitamin D (OYSTER SHELL CALCIUM 500 + D) 500-125 MG-UNIT TABS Take 1 tablet by mouth daily.     . Cholecalciferol (VITAMIN D3) 10 MCG (400 UNIT) tablet Take 400 Units by mouth daily.    Marland Kitchen glucose blood (CONTOUR NEXT TEST) test strip TEST BLOOD SUGAR  ONCE DAILY 100 each 2  . Insulin Glargine (TOUJEO SOLOSTAR) 300 UNIT/ML SOPN Inject 54 Units into the skin at bedtime. 6 pen 6  . Insulin Pen Needle 32G X 4 MM MISC 1 Units by Does not apply route every morning. Pen needles 90 each 3  . JARDIANCE 25 MG TABS tablet Take 1 tablet (25 mg total) by mouth daily. 90 tablet 4  . levonorgestrel (MIRENA) 20 MCG/24HR IUD 1 each by Intrauterine route once.     Marland Kitchen lisinopril (ZESTRIL) 40 MG tablet TAKE 1 TABLET BY MOUTH EVERY DAY 90 tablet 1  . OXYGEN Inhale 2 L into the lungs daily.    . furosemide (LASIX) 40 MG tablet Take 1 tablet (40 mg total) by mouth 2 (two) times daily. 180 tablet 3   No current facility-administered medications for this visit.    Patient confirms/reports the following allergies:  No Known Allergies  No orders of the defined types were placed in this encounter.   AUTHORIZATION INFORMATION Primary Insurance: 1D#: Group #:  Secondary Insurance: 1D#: Group #:  SCHEDULE INFORMATION: Date: Tuesday 01/11/20 Time: Location:[facility]

## 2020-01-06 ENCOUNTER — Other Ambulatory Visit: Payer: Self-pay | Admitting: Nurse Practitioner

## 2020-01-06 DIAGNOSIS — Z1231 Encounter for screening mammogram for malignant neoplasm of breast: Secondary | ICD-10-CM

## 2020-01-07 ENCOUNTER — Other Ambulatory Visit: Payer: Self-pay

## 2020-01-07 ENCOUNTER — Other Ambulatory Visit
Admission: RE | Admit: 2020-01-07 | Discharge: 2020-01-07 | Disposition: A | Discharge status: Home / Self Care | Payer: Medicare Other | Source: Ambulatory Visit | Attending: Gastroenterology | Admitting: Gastroenterology

## 2020-01-07 DIAGNOSIS — Z01812 Encounter for preprocedural laboratory examination: Secondary | ICD-10-CM | POA: Diagnosis not present

## 2020-01-07 DIAGNOSIS — Z20822 Contact with and (suspected) exposure to covid-19: Secondary | ICD-10-CM | POA: Insufficient documentation

## 2020-01-08 LAB — SARS CORONAVIRUS 2 (TAT 6-24 HRS): SARS Coronavirus 2: NEGATIVE

## 2020-01-10 ENCOUNTER — Encounter: Payer: Self-pay | Admitting: Certified Registered Nurse Anesthetist

## 2020-01-10 ENCOUNTER — Encounter: Payer: Self-pay | Admitting: Gastroenterology

## 2020-01-11 ENCOUNTER — Ambulatory Visit: Admission: RE | Admit: 2020-01-11 | Payer: Medicare Other | Source: Home / Self Care | Admitting: Gastroenterology

## 2020-01-11 ENCOUNTER — Encounter: Admission: RE | Payer: Self-pay | Source: Home / Self Care

## 2020-01-11 SURGERY — COLONOSCOPY WITH PROPOFOL
Anesthesia: General

## 2020-01-26 ENCOUNTER — Ambulatory Visit
Admission: RE | Admit: 2020-01-26 | Discharge: 2020-01-26 | Disposition: A | Discharge status: Home / Self Care | Payer: Medicare Other | Source: Ambulatory Visit | Attending: Nurse Practitioner | Admitting: Nurse Practitioner

## 2020-01-26 ENCOUNTER — Other Ambulatory Visit: Payer: Self-pay

## 2020-01-26 DIAGNOSIS — Z1231 Encounter for screening mammogram for malignant neoplasm of breast: Secondary | ICD-10-CM | POA: Insufficient documentation

## 2020-01-31 DIAGNOSIS — R059 Cough, unspecified: Secondary | ICD-10-CM | POA: Diagnosis not present

## 2020-01-31 DIAGNOSIS — Z20828 Contact with and (suspected) exposure to other viral communicable diseases: Secondary | ICD-10-CM | POA: Diagnosis not present

## 2020-02-03 DIAGNOSIS — Z20828 Contact with and (suspected) exposure to other viral communicable diseases: Secondary | ICD-10-CM | POA: Diagnosis not present

## 2020-02-04 ENCOUNTER — Encounter: Payer: Self-pay | Admitting: Emergency Medicine

## 2020-02-04 ENCOUNTER — Emergency Department: Payer: Medicare Other

## 2020-02-04 ENCOUNTER — Other Ambulatory Visit: Payer: Self-pay

## 2020-02-04 DIAGNOSIS — U071 COVID-19: Secondary | ICD-10-CM | POA: Diagnosis not present

## 2020-02-04 DIAGNOSIS — Z79899 Other long term (current) drug therapy: Secondary | ICD-10-CM | POA: Diagnosis not present

## 2020-02-04 DIAGNOSIS — R059 Cough, unspecified: Secondary | ICD-10-CM | POA: Diagnosis not present

## 2020-02-04 DIAGNOSIS — N182 Chronic kidney disease, stage 2 (mild): Secondary | ICD-10-CM | POA: Insufficient documentation

## 2020-02-04 DIAGNOSIS — R509 Fever, unspecified: Secondary | ICD-10-CM | POA: Diagnosis not present

## 2020-02-04 DIAGNOSIS — I130 Hypertensive heart and chronic kidney disease with heart failure and stage 1 through stage 4 chronic kidney disease, or unspecified chronic kidney disease: Secondary | ICD-10-CM | POA: Diagnosis not present

## 2020-02-04 DIAGNOSIS — E1122 Type 2 diabetes mellitus with diabetic chronic kidney disease: Secondary | ICD-10-CM | POA: Diagnosis not present

## 2020-02-04 DIAGNOSIS — Z8616 Personal history of COVID-19: Secondary | ICD-10-CM | POA: Diagnosis not present

## 2020-02-04 DIAGNOSIS — Z794 Long term (current) use of insulin: Secondary | ICD-10-CM | POA: Insufficient documentation

## 2020-02-04 DIAGNOSIS — H1031 Unspecified acute conjunctivitis, right eye: Secondary | ICD-10-CM | POA: Insufficient documentation

## 2020-02-04 DIAGNOSIS — I5032 Chronic diastolic (congestive) heart failure: Secondary | ICD-10-CM | POA: Insufficient documentation

## 2020-02-04 DIAGNOSIS — J1282 Pneumonia due to coronavirus disease 2019: Secondary | ICD-10-CM | POA: Diagnosis not present

## 2020-02-04 DIAGNOSIS — I517 Cardiomegaly: Secondary | ICD-10-CM | POA: Diagnosis not present

## 2020-02-04 DIAGNOSIS — Z20822 Contact with and (suspected) exposure to covid-19: Secondary | ICD-10-CM | POA: Diagnosis not present

## 2020-02-04 DIAGNOSIS — J189 Pneumonia, unspecified organism: Secondary | ICD-10-CM | POA: Diagnosis not present

## 2020-02-04 NOTE — ED Triage Notes (Signed)
Patient ambulatory to triage with complaints of cough with green and red streaked phlegm, runny nose with same colors, "my glands are swollen and hurting" [points to both sides of neck]; right eye red, chills for 7 days.  Pt denies COVID exposure and reports vac for same but no booster or flu vac yet  Pt reports taking tylenol this am  Pt is hoarse but speaking in complete coherent sentences. No acute breathing distress noted.

## 2020-02-04 NOTE — ED Notes (Signed)
Patient states that she is suppose to wear oxygen at 2L. Patient placed on 2L [place] and patient's oxygen saturation increased to 96%.

## 2020-02-05 ENCOUNTER — Emergency Department
Admission: EM | Admit: 2020-02-05 | Discharge: 2020-02-05 | Disposition: A | Discharge status: Home / Self Care | Payer: Medicare Other | Attending: Emergency Medicine | Admitting: Emergency Medicine

## 2020-02-05 DIAGNOSIS — Z20822 Contact with and (suspected) exposure to covid-19: Secondary | ICD-10-CM | POA: Diagnosis not present

## 2020-02-05 DIAGNOSIS — H1031 Unspecified acute conjunctivitis, right eye: Secondary | ICD-10-CM | POA: Diagnosis not present

## 2020-02-05 DIAGNOSIS — J189 Pneumonia, unspecified organism: Secondary | ICD-10-CM | POA: Diagnosis not present

## 2020-02-05 DIAGNOSIS — I130 Hypertensive heart and chronic kidney disease with heart failure and stage 1 through stage 4 chronic kidney disease, or unspecified chronic kidney disease: Secondary | ICD-10-CM | POA: Diagnosis not present

## 2020-02-05 DIAGNOSIS — Z8616 Personal history of COVID-19: Secondary | ICD-10-CM | POA: Diagnosis not present

## 2020-02-05 DIAGNOSIS — I5032 Chronic diastolic (congestive) heart failure: Secondary | ICD-10-CM | POA: Diagnosis not present

## 2020-02-05 DIAGNOSIS — U071 COVID-19: Secondary | ICD-10-CM

## 2020-02-05 DIAGNOSIS — J1282 Pneumonia due to coronavirus disease 2019: Secondary | ICD-10-CM

## 2020-02-05 DIAGNOSIS — Z79899 Other long term (current) drug therapy: Secondary | ICD-10-CM | POA: Diagnosis not present

## 2020-02-05 DIAGNOSIS — Z794 Long term (current) use of insulin: Secondary | ICD-10-CM | POA: Diagnosis not present

## 2020-02-05 DIAGNOSIS — E1122 Type 2 diabetes mellitus with diabetic chronic kidney disease: Secondary | ICD-10-CM | POA: Diagnosis not present

## 2020-02-05 DIAGNOSIS — R059 Cough, unspecified: Secondary | ICD-10-CM | POA: Diagnosis present

## 2020-02-05 DIAGNOSIS — N182 Chronic kidney disease, stage 2 (mild): Secondary | ICD-10-CM | POA: Diagnosis not present

## 2020-02-05 LAB — BASIC METABOLIC PANEL
Anion gap: 11 (ref 5–15)
BUN: 18 mg/dL (ref 6–20)
CO2: 29 mmol/L (ref 22–32)
Calcium: 8.3 mg/dL — ABNORMAL LOW (ref 8.9–10.3)
Chloride: 100 mmol/L (ref 98–111)
Creatinine, Ser: 0.91 mg/dL (ref 0.44–1.00)
GFR, Estimated: >60 mL/min (ref >60)
Glucose, Bld: 119 mg/dL — ABNORMAL HIGH (ref 70–99)
Potassium: 3.4 mmol/L — ABNORMAL LOW (ref 3.5–5.1)
Sodium: 140 mmol/L (ref 135–145)

## 2020-02-05 LAB — CBC WITH DIFFERENTIAL/PLATELET
Abs Immature Granulocytes: 0.08 10*3/uL — ABNORMAL HIGH (ref 0.00–0.07)
Basophils Absolute: 0.1 10*3/uL (ref 0.0–0.1)
Basophils Relative: 1 %
Eosinophils Absolute: 0.2 10*3/uL (ref 0.0–0.5)
Eosinophils Relative: 2 %
HCT: 41.8 % (ref 36.0–46.0)
Hemoglobin: 12.3 g/dL (ref 12.0–15.0)
Immature Granulocytes: 1 %
Lymphocytes Relative: 16 %
Lymphs Abs: 2.2 10*3/uL (ref 0.7–4.0)
MCH: 25.6 pg — ABNORMAL LOW (ref 26.0–34.0)
MCHC: 29.4 g/dL — ABNORMAL LOW (ref 30.0–36.0)
MCV: 87.1 fL (ref 80.0–100.0)
Monocytes Absolute: 1.2 10*3/uL — ABNORMAL HIGH (ref 0.1–1.0)
Monocytes Relative: 9 %
Neutro Abs: 9.8 10*3/uL — ABNORMAL HIGH (ref 1.7–7.7)
Neutrophils Relative %: 71 %
Platelets: 344 10*3/uL (ref 150–400)
RBC: 4.8 MIL/uL (ref 3.87–5.11)
RDW: 15.3 % (ref 11.5–15.5)
WBC: 13.5 10*3/uL — ABNORMAL HIGH (ref 4.0–10.5)
nRBC: 0 % (ref 0.0–0.2)

## 2020-02-05 LAB — POC SARS CORONAVIRUS 2 AG -  ED: SARS Coronavirus 2 Ag: NEGATIVE

## 2020-02-05 LAB — TROPONIN I (HIGH SENSITIVITY): Troponin I (High Sensitivity): 11 ng/L (ref <18)

## 2020-02-05 MED ORDER — AZITHROMYCIN 250 MG PO TABS: 250.0000 mg | ORAL_TABLET | Freq: Every day | ORAL | 0 refills | Status: DC

## 2020-02-05 MED ORDER — PREDNISONE 20 MG PO TABS
50.0000 mg | ORAL_TABLET | Freq: Once | ORAL | Status: AC
  Administered 2020-02-05: 50 mg via ORAL
  Filled 2020-02-05: qty 2

## 2020-02-05 MED ORDER — ALBUTEROL SULFATE HFA 108 (90 BASE) MCG/ACT IN AERS: 2.0000 | INHALATION_SPRAY | RESPIRATORY_TRACT | 0 refills | Status: DC | PRN

## 2020-02-05 MED ORDER — PREDNISONE 50 MG PO TABS: ORAL_TABLET | ORAL | 0 refills | Status: DC

## 2020-02-05 MED ORDER — TOBRAMYCIN 0.3 % OP SOLN
2.0000 [drp] | OPHTHALMIC | Status: DC
  Administered 2020-02-05: 2 [drp] via OPHTHALMIC
  Filled 2020-02-05: qty 5

## 2020-02-05 MED ORDER — HYDROCOD POLST-CPM POLST ER 10-8 MG/5ML PO SUER
5.0000 mL | Freq: Once | ORAL | Status: AC
  Administered 2020-02-05: 5 mL via ORAL
  Filled 2020-02-05: qty 5

## 2020-02-05 MED ORDER — AZITHROMYCIN 500 MG PO TABS
500.0000 mg | ORAL_TABLET | Freq: Once | ORAL | Status: AC
  Administered 2020-02-05: 500 mg via ORAL
  Filled 2020-02-05: qty 1

## 2020-02-05 MED ORDER — HYDROCOD POLST-CPM POLST ER 10-8 MG/5ML PO SUER: 5.0000 mL | Freq: Two times a day (BID) | ORAL | 0 refills | Status: DC

## 2020-02-05 NOTE — Discharge Instructions (Signed)
You will be called if your PCR Covid test is positive.  Your x-ray is concerning for Covid 19 pneumonia.  Finish steroid and antibiotic as prescribed.  You may take Tussionex as needed for cough and sore throat.  Use Albuterol inhaler 2 puffs every 4 hours as needed for difficulty breathing.  Return to the ER for worsening symptoms, persistent vomiting, difficulty breathing or other concerns.

## 2020-02-05 NOTE — ED Provider Notes (Signed)
[facility] Emergency Department Provider Note   ____________________________________________   Event Date/Time   First MD Initiated Contact with Patient 02/05/20 0112     (approximate)  I have reviewed the triage vital signs and the nursing notes.   HISTORY  Chief Complaint Cough (Chills /), Lymphadenopathy, and Nasal Congestion    HPI Sara Zhang is a 50 y.o. female who presents to the ED from home with a chief complaint of productive cough, nasal congestion, cough, right red eye, chills and sore throat.  Patient is vaccinated against COVID-19.  Denies exposure.  Had a negative test 01/07/2020.  Patient wears continuous oxygenation for CHF.  Also with a history of obesity, diabetes, hypertension, hyperlipidemia.  Denies fever, chest pain, shortness of breath, abdominal pain, nausea, vomiting or diarrhea     Past Medical History:  Diagnosis Date  . Abnormal uterine bleeding (AUB) 11/25/2016  . Chronic diastolic CHF (congestive heart failure) ([place])    a. 02/2012 Echo: EF 25-35%, glob HK; b. 06/2016 Echo: EF 55-60%, mild MR, nl PASP.  Marland Kitchen CKD (chronic kidney disease), stage II   . COVID-19   . Diabetes mellitus without complication ([place])   . Extreme obesity   . Hyperlipidemia   . Hypertension   . Hypoxia   . Microcytic anemia   . Pneumonia   . Sleep apnea   . Status post endometrial ablation 06/20/2016   2015 hysteroscopy/D&C with NovaSure endometrial ablation; pathology-benign endometrial polyps  . Thickened endometrium 06/20/2016   Ultrasound-22.3 mm endometrial stripe, heterogenous  . Vaginal bleeding    a. 01/2017 s/p hyteroscopy and polypectomy [facility]).  . Vitamin D deficiency     Patient Active Problem List   Diagnosis Date Noted  . History of 2019 novel coronavirus disease (COVID-19) 06/15/2019  . Proteinuria due to type 2 diabetes mellitus ([place]) 06/15/2019  . Chronic heart failure with preserved ejection fraction ([place]) 10/22/2018  . NICM  (nonischemic cardiomyopathy) ([place]) 10/22/2018  . BMI 50.0-59.9, adult ([place]) 11/18/2017  . Elevated TSH 12/09/2016  . Type 2 diabetes mellitus treated with insulin ([place]) 04/25/2015  . Hyperlipidemia associated with type 2 diabetes mellitus ([place]) 11/10/2014  . Obstructive sleep apnea 11/10/2014  . Morbid obesity ([place]) 11/10/2014  . Vitamin D deficiency 11/10/2014  . Benign hypertensive heart and kidney disease with CHF and stage 2 chronic kidney disease ([place]) 11/10/2014    Past Surgical History:  Procedure Laterality Date  . CESAREAN SECTION    . CHOLECYSTECTOMY    . DILATION AND CURETTAGE, DIAGNOSTIC / THERAPEUTIC    . TONSILLECTOMY AND ADENOIDECTOMY    . TOOTH EXTRACTION  01/2016    Prior to Admission medications   Medication Sig Start Date End Date Taking? Authorizing Provider  albuterol (VENTOLIN HFA) 108 (90 Base) MCG/ACT inhaler Inhale 2 puffs into the lungs every 4 (four) hours as needed for wheezing or shortness of breath. 02/05/20  Yes Paulette Blanch, MD  azithromycin (ZITHROMAX) 250 MG tablet Take 1 tablet (250 mg total) by mouth daily. 02/05/20  Yes Paulette Blanch, MD  chlorpheniramine-HYDROcodone [facility] PENNKINETIC ER) 10-8 MG/5ML SUER Take 5 mLs by mouth 2 (two) times daily. 02/05/20  Yes Paulette Blanch, MD  predniSONE (DELTASONE) 50 MG tablet 1 tablet daily until finished 02/05/20  Yes Paulette Blanch, MD  amLODipine (NORVASC) 5 MG tablet TAKE 1 TABLET(5 MG) BY MOUTH DAILY 11/11/19   End, Harrell Gave, MD  atenolol (TENORMIN) 50 MG tablet Take 1 tablet (50 mg total) by mouth  daily. 06/15/19   Cannady, Henrine Screws T, NP  atorvastatin (LIPITOR) 10 MG tablet TAKE 1 TABLET(10 MG) BY MOUTH DAILY 06/05/19   Cannady, Jolene T, NP  benzonatate (TESSALON PERLES) 100 MG capsule Take 1 capsule (100 mg total) by mouth 3 (three) times daily as needed for cough. 12/22/19   Cannady, Henrine Screws T, NP  blood glucose meter kit and supplies KIT Dispense based on patient and insurance preference. Use up to four times  daily as directed. (FOR ICD-9 250.00, 250.01). 11/18/17   Cannady, Henrine Screws T, NP  Blood Glucose Monitoring Suppl (CONTOUR NEXT MONITOR) w/Device KIT To check blood sugar 4 times daily. 12/22/19   Cannady, Jolene T, NP  BYDUREON BCISE 2 MG/0.85ML AUIJ INJECT 2 MG UNDER THE SKIN EVERY WEEK 10/20/19   Cannady, Henrine Screws T, NP  Calcium Carbonate-Vitamin D (OYSTER SHELL CALCIUM 500 + D) 500-125 MG-UNIT TABS Take 1 tablet by mouth daily.     [provider]  Cholecalciferol (VITAMIN D3) 10 MCG (400 UNIT) tablet Take 400 Units by mouth daily.    [provider]  furosemide (LASIX) 40 MG tablet Take 1 tablet (40 mg total) by mouth 2 (two) times daily. 06/01/19 12/22/19  Theora Gianotti, NP  glucose blood (CONTOUR NEXT TEST) test strip TEST BLOOD SUGAR ONCE DAILY 01/09/18   Cannady, Henrine Screws T, NP  Insulin Glargine (TOUJEO SOLOSTAR) 300 UNIT/ML SOPN Inject 54 Units into the skin at bedtime. 11/18/17   Cannady, Henrine Screws T, NP  Insulin Pen Needle 32G X 4 MM MISC 1 Units by Does not apply route every morning. Pen needles 08/21/16   Kathrine Haddock, NP  JARDIANCE 25 MG TABS tablet Take 1 tablet (25 mg total) by mouth daily. 12/22/19   Marnee Guarneri T, NP  levonorgestrel (MIRENA) 20 MCG/24HR IUD 1 each by Intrauterine route once.     [provider]  lisinopril (ZESTRIL) 40 MG tablet TAKE 1 TABLET BY MOUTH EVERY DAY 10/28/19   End, Harrell Gave, MD  OXYGEN Inhale 2 L into the lungs daily.    [provider]    Allergies Patient has no known allergies.  Family History  Problem Relation Age of Onset  . Diabetes Mother   . Hyperlipidemia Mother   . Hypertension Mother   . Stroke Mother   . Arthritis Father   . Asthma Father   . Diabetes Father   . Hypertension Father   . Hypertension Brother   . Diabetes Brother   . Diabetes Maternal Grandmother   . Hypertension Maternal Grandmother   . Diabetes Maternal Grandfather   . Hypertension Maternal Grandfather   . Diabetes  Paternal Grandmother   . Hypertension Paternal Grandmother   . Heart attack Maternal Uncle   . Breast cancer Neg Hx     Social History Social History   Tobacco Use  . Smoking status: Never Smoker  . Smokeless tobacco: Never Used  Vaping Use  . Vaping Use: Never used  Substance Use Topics  . Alcohol use: Yes    Alcohol/week: 0.0 standard drinks    Comment: 1-2 beers/month  . Drug use: No    Review of Systems  Constitutional: No fever/chills Eyes: Positive for red right eye.  No visual changes. ENT: Positive for sore throat. Cardiovascular: Denies chest pain. Respiratory: Positive for cough.  Denies shortness of breath. Gastrointestinal: No abdominal pain.  No nausea, no vomiting.  No diarrhea.  No constipation. Genitourinary: Negative for dysuria. Musculoskeletal: Negative for back pain. Skin: Negative for rash. Neurological: Negative  for headaches, focal weakness or numbness.   ____________________________________________   PHYSICAL EXAM:  VITAL SIGNS: ED Triage Vitals  Enc Vitals Group     BP 02/04/20 2038 (!) 141/77     Pulse Rate 02/04/20 2038 79     Resp 02/04/20 2038 20     Temp 02/04/20 2038 98.9 F (37.2 C)     Temp Source 02/04/20 2038 Oral     SpO2 02/04/20 2038 90 %     Weight 02/04/20 2039 260 lb (117.9 kg)     Height 02/04/20 2039 _0  (1.753 m)     Head Circumference --      Peak Flow --      Pain Score 02/04/20 2038 8     Pain Loc --      Pain Edu? --      Excl. in [place]? --     Constitutional: Alert and oriented. Well appearing and in no acute distress. Eyes: Injected right conjunctiva. PERRL. EOMI. Head: Atraumatic. Nose: No congestion/rhinnorhea. Mouth/Throat: Mucous membranes are moist.  Oropharynx mildly erythematous without tonsillar swelling, exudates or peritonsillar abscess.  There is no hoarse or muffled voice.  There is no drooling. Neck: No stridor.   Cardiovascular: Normal rate, regular rhythm. Grossly normal heart sounds.   Good peripheral circulation. Respiratory: Normal respiratory effort.  No retractions. Lungs CTAB. Gastrointestinal: Soft and nontender. No distention. No abdominal bruits. No CVA tenderness. Musculoskeletal: No lower extremity tenderness nor edema.  No joint effusions. Neurologic:  Normal speech and language. No gross focal neurologic deficits are appreciated. No gait instability. Skin:  Skin is warm, dry and intact. No rash noted.  No petechiae. Psychiatric: Mood and affect are normal. Speech and behavior are normal.  ____________________________________________   LABS (all labs ordered are listed, but only abnormal results are displayed)  Labs Reviewed  CBC WITH DIFFERENTIAL/PLATELET - Abnormal; Notable for the following components:      Result Value   WBC 13.5 (*)    MCH 25.6 (*)    MCHC 29.4 (*)    Neutro Abs 9.8 (*)    Monocytes Absolute 1.2 (*)    Abs Immature Granulocytes 0.08 (*)    All other components within normal limits  BASIC METABOLIC PANEL - Abnormal; Notable for the following components:   Potassium 3.4 (*)    Glucose, Bld 119 (*)    Calcium 8.3 (*)    All other components within normal limits  SARS CORONAVIRUS 2 (TAT 6-24 HRS)  POC SARS CORONAVIRUS 2 AG -  ED  TROPONIN I (HIGH SENSITIVITY)  TROPONIN I (HIGH SENSITIVITY)   ____________________________________________  EKG  None ____________________________________________  RADIOLOGY I, Laurana Magistro J, personally viewed and evaluated these images (plain radiographs) as part of my medical decision making, as well as reviewing the written report by the radiologist.  ED MD interpretation: Probable atypical infection  Official radiology report(s): DG Chest 2 View  Result Date: 02/04/2020 CLINICAL DATA:  Fever and cough EXAM: CHEST - 2 VIEW COMPARISON:  03/16/2019 FINDINGS: Cardiomegaly. Patchy and slightly nodular perihilar opacities. No pleural effusion. No pneumothorax. IMPRESSION: 1. Cardiomegaly without  overt edema. 2. Patchy and somewhat nodular perihilar opacities, question atypical infection. Electronically Signed   By: Donavan Foil M.D.   On: 02/04/2020 23:32    ____________________________________________   PROCEDURES  Procedure(s) performed (including Critical Care):  Procedures   ____________________________________________   INITIAL IMPRESSION / ASSESSMENT AND PLAN / ED COURSE  As part of my medical decision making, I reviewed the  following data within the [place] notes reviewed and incorporated, Labs reviewed, Old chart reviewed, Radiograph reviewed  and Notes from prior ED visits     50 year old female presenting with COVID-19 symptoms Differential includes, but is not limited to, viral syndrome, bronchitis including COPD exacerbation, pneumonia, reactive airway disease including asthma, CHF including exacerbation with or without pulmonary/interstitial edema, pneumothorax, ACS, thoracic trauma, and pulmonary embolism.  Documented 88% sats was patient not on her chronic oxygen.'s once her oxygen was placed on her, sats improved to 97%.  She is not febrile, tachycardic, tachypneic and remains hemodynamically stable.  Laboratory results demonstrate mild leukocytosis, chest x-ray concerning for atypical infection. Rapid Covid antigen negative. Strong suspicion for COVID-19 and therefore we will do PCR test.  In the interim, will start patient on steroid, antibiotic, cough medicine and inhaler.  If she is positive, I will refer her to the antibody infusion clinic.  Strict return precautions given.  Patient verbalizes understanding agrees with plan of care      ____________________________________________   FINAL CLINICAL IMPRESSION(S) / ED DIAGNOSES  Final diagnoses:  Pneumonia due to COVID-19 virus  Acute conjunctivitis of right eye, unspecified acute conjunctivitis type     ED Discharge Orders         Ordered    albuterol (VENTOLIN HFA)  108 (90 Base) MCG/ACT inhaler  Every 4 hours PRN        02/05/20 0141    predniSONE (DELTASONE) 50 MG tablet        02/05/20 0141    azithromycin (ZITHROMAX) 250 MG tablet  Daily        02/05/20 0141    chlorpheniramine-HYDROcodone (TUSSIONEX PENNKINETIC ER) 10-8 MG/5ML SUER  2 times daily        02/05/20 0141          *Please note:  Sara Zhang was evaluated in Emergency Department on 02/05/2020 for the symptoms described in the history of present illness. She was evaluated in the context of the global COVID-19 pandemic, which necessitated consideration that the patient might be at risk for infection with the SARS-CoV-2 virus that causes COVID-19. Institutional protocols and algorithms that pertain to the evaluation of patients at risk for COVID-19 are in a state of rapid change based on information released by regulatory bodies including the CDC and federal and state organizations. These policies and algorithms were followed during the patient's care in the ED.  Some ED evaluations and interventions may be delayed as a result of limited staffing during and the pandemic.*   Note:  This document was prepared using Dragon voice recognition software and may include unintentional dictation errors.   Paulette Blanch, MD 02/05/20 [phone]

## 2020-02-09 ENCOUNTER — Encounter: Payer: Self-pay | Admitting: Nurse Practitioner

## 2020-02-09 ENCOUNTER — Telehealth (INDEPENDENT_AMBULATORY_CARE_PROVIDER_SITE_OTHER): Payer: Medicare Other | Admitting: Nurse Practitioner

## 2020-02-09 DIAGNOSIS — J189 Pneumonia, unspecified organism: Secondary | ICD-10-CM | POA: Diagnosis not present

## 2020-02-09 MED ORDER — HYDROCOD POLST-CPM POLST ER 10-8 MG/5ML PO SUER: 5.0000 mL | Freq: Two times a day (BID) | ORAL | 0 refills | Status: DC

## 2020-02-09 MED ORDER — PREDNISONE 20 MG PO TABS: 40.0000 mg | ORAL_TABLET | Freq: Every day | ORAL | 0 refills | Status: AC

## 2020-02-09 MED ORDER — AMOXICILLIN-POT CLAVULANATE 875-125 MG PO TABS: 1.0000 | ORAL_TABLET | Freq: Two times a day (BID) | ORAL | 0 refills | Status: AC

## 2020-02-09 NOTE — Patient Instructions (Signed)
Community-Acquired Pneumonia, Adult Pneumonia is an infection of the lungs. It causes swelling in the airways of the lungs. Mucus and fluid may also build up inside the airways. One type of pneumonia can happen while a person is in a hospital. A different type can happen when a person is not in a hospital (community-acquired pneumonia).  What are the causes?  This condition is caused by germs (viruses, bacteria, or fungi). Some types of germs can be passed from one person to another. This can happen when you breathe in droplets from the cough or sneeze of an infected person. What increases the risk? You are more likely to develop this condition if you:  Have a long-term (chronic) disease, such as: ? Chronic obstructive pulmonary disease (COPD). ? Asthma. ? Cystic fibrosis. ? Congestive heart failure. ? Diabetes. ? Kidney disease.  Have HIV.  Have sickle cell disease.  Have had your spleen removed.  Do not take good care of your teeth and mouth (poor dental hygiene).  Have a medical condition that increases the risk of breathing in droplets from your own mouth and nose.  Have a weakened body defense system (immune system).  Are a smoker.  Travel to areas where the germs that cause this illness are common.  Are around certain animals or the places they live. What are the signs or symptoms?  A dry cough.  A wet (productive) cough.  Fever.  Sweating.  Chest pain. This often happens when breathing deeply or coughing.  Fast breathing or trouble breathing.  Shortness of breath.  Shaking chills.  Feeling tired (fatigue).  Muscle aches. How is this treated? Treatment for this condition depends on many things. Most adults can be treated at home. In some cases, treatment must happen in a hospital. Treatment may include:  Medicines given by mouth or through an IV tube.  Being given extra oxygen.  Respiratory therapy. In rare cases, treatment for very bad pneumonia  may include:  Using a machine to help you breathe.  Having a procedure to remove fluid from around your lungs. Follow these instructions at home: Medicines  Take over-the-counter and prescription medicines only as told by your doctor. ? Only take cough medicine if you are losing sleep.  If you were prescribed an antibiotic medicine, take it as told by your doctor. Do not stop taking the antibiotic even if you start to feel better. General instructions   Sleep with your head and neck raised (elevated). You can do this by sleeping in a recliner or by putting a few pillows under your head.  Rest as needed. Get at least 8 hours of sleep each night.  Drink enough water to keep your pee (urine) pale yellow.  Eat a healthy diet that includes plenty of vegetables, fruits, whole grains, low-fat dairy products, and lean protein.  Do not use any products that contain nicotine or tobacco. These include cigarettes, e-cigarettes, and chewing tobacco. If you need help quitting, ask your doctor.  Keep all follow-up visits as told by your doctor. This is important. How is this prevented? A shot (vaccine) can help prevent pneumonia. Shots are often suggested for:  People older than 52 years of age.  People older than 52 years of age who: ? Are having cancer treatment. ? Have long-term (chronic) lung disease. ? Have problems with their body's defense system. You may also prevent pneumonia if you take these actions:  Get the flu (influenza) shot every year.  Go to the dentist as   often as told.  Wash your hands often. If you cannot use soap and water, use hand sanitizer. Contact a doctor if:  You have a fever.  You lose sleep because your cough medicine does not help. Get help right away if:  You are short of breath and it gets worse.  You have more chest pain.  Your sickness gets worse. This is very serious if: ? You are an older adult. ? Your body's defense system is weak.  You  cough up blood. Summary  Pneumonia is an infection of the lungs.  Most adults can be treated at home. Some will need treatment in a hospital.  Drink enough water to keep your pee pale yellow.  Get at least 8 hours of sleep each night. This information is not intended to replace advice given to you by your health care provider. Make sure you discuss any questions you have with your health care provider. Document Revised: 05/13/2018 Document Reviewed: 09/18/2017 Elsevier Patient Education  2020 Elsevier Inc.  

## 2020-02-09 NOTE — Progress Notes (Signed)
There were no vitals taken for this visit.   Subjective:    Patient ID: Sara Zhang, female    DOB: 06/28/1970, 50 y.o.   MRN: [id number]  HPI: Sara Zhang is a 50 y.o. female  Chief Complaint  Patient presents with  . Hosp f/u    DX of pneumonia at hospital [facility]) Patient states she is feeling a little better.     . This visit was completed via telephone due to the restrictions of the COVID-19 pandemic. All issues as above were discussed and addressed but no physical exam was performed. If it was felt that the patient should be evaluated in the office, they were directed there. The patient verbally consented to this visit. Patient was unable to complete an audio/visual visit due to Technical difficulties,Lack of internet. Due to the catastrophic nature of the COVID-19 pandemic, this visit was done through audio contact only. . Location of the patient: home . Location of the provider: work . Those involved with this call:  . Provider: Marnee Guarneri, DNP . CMA: Yvonna Alanis, CMA . Front Desk/Registration: Jill Side  . Time spent on call: 21 minutes on the phone discussing health concerns. 15 minutes total spent in review of patient's record and preparation of their chart.  . I verified patient identity using two factors (patient name and date of birth). Patient consents verbally to being seen via telemedicine visit today.   ER FOLLOW UP Seen in ER on 02/05/2020 and treated for suspected pneumonia from Covid.  Imaging noting "Patchy and somewhat nodular perihilar opacities, question atypical infection". She has history of Covid with admission in December 2020.  Her current Covid tests are all negative.  She was sent home with Azithromycin and Prednisone.  She does not feel 100% at this time, still with a lot of coughing and phlegm she reports.  Denies any fevers, chills, red eye, myalgias.  No congestion.  Does have mild rhinorrhea.  She was not tested for flu.  Does  endorse some SOB, continues on her maintenance oxygen at home at 2L.   Time since discharge: 02/05/2020 Hospital/facility: [facility] Diagnosis: Pneumonia Procedures/tests: x-ray -- CBC noting WBC 13.5, neutrophils 9.8, BMP = K+ 3.4 Consultants: none New medications: Azithromycin Discharge instructions:  Follow-up with PCP Status: stable   Relevant past medical, surgical, family and social history reviewed and updated as indicated. Interim medical history since our last visit reviewed. Allergies and medications reviewed and updated.  Review of Systems  Constitutional: Positive for fatigue. Negative for activity change, appetite change, chills and fever.  HENT: Positive for postnasal drip and rhinorrhea. Negative for congestion, ear discharge, ear pain, facial swelling, sinus pressure, sinus pain, sneezing, sore throat and voice change.   Eyes: Negative for pain and visual disturbance.  Respiratory: Positive for cough and shortness of breath. Negative for chest tightness and wheezing.   Cardiovascular: Negative for chest pain, palpitations and leg swelling.  Gastrointestinal: Negative.   Endocrine: Negative.   Musculoskeletal: Negative for myalgias.  Neurological: Negative for dizziness, numbness and headaches.  Psychiatric/Behavioral: Negative.     Per HPI unless specifically indicated above     Objective:    There were no vitals taken for this visit.  Wt Readings from Last 3 Encounters:  02/04/20 260 lb (117.9 kg)  12/22/19 (!) 356 lb 12.8 oz (161.8 kg)  11/08/19 (!) 352 lb 6 oz (159.8 kg)    Physical Exam   Unable to perform due to telephone visit only, patient  with technical issues.  No SOB noted with talking.  Results for orders placed or performed during the hospital encounter of 02/05/20  CBC with Differential  Result Value Ref Range   WBC 13.5 (H) 4.0 - 10.5 K/uL   RBC 4.80 3.87 - 5.11 MIL/uL   Hemoglobin 12.3 12.0 - 15.0 g/dL   HCT 41.8 36.0 - 46.0 %   MCV 87.1 80.0 -  100.0 fL   MCH 25.6 (L) 26.0 - 34.0 pg   MCHC 29.4 (L) 30.0 - 36.0 g/dL   RDW 15.3 11.5 - 15.5 %   Platelets 344 150 - 400 K/uL   nRBC 0.0 0.0 - 0.2 %   Neutrophils Relative % 71 %   Neutro Abs 9.8 (H) 1.7 - 7.7 K/uL   Lymphocytes Relative 16 %   Lymphs Abs 2.2 0.7 - 4.0 K/uL   Monocytes Relative 9 %   Monocytes Absolute 1.2 (H) 0.1 - 1.0 K/uL   Eosinophils Relative 2 %   Eosinophils Absolute 0.2 0.0 - 0.5 K/uL   Basophils Relative 1 %   Basophils Absolute 0.1 0.0 - 0.1 K/uL   Immature Granulocytes 1 %   Abs Immature Granulocytes 0.08 (H) 0.00 - 0.07 K/uL  Basic metabolic panel  Result Value Ref Range   Sodium 140 135 - 145 mmol/L   Potassium 3.4 (L) 3.5 - 5.1 mmol/L   Chloride 100 98 - 111 mmol/L   CO2 29 22 - 32 mmol/L   Glucose, Bld 119 (H) 70 - 99 mg/dL   BUN 18 6 - 20 mg/dL   Creatinine, Ser 0.91 0.44 - 1.00 mg/dL   Calcium 8.3 (L) 8.9 - 10.3 mg/dL   GFR, Estimated >60 >60 mL/min   Anion gap 11 5 - 15  POC SARS Coronavirus 2 Ag-ED - Nasal Swab (BD Veritor Kit)  Result Value Ref Range   SARS Coronavirus 2 Ag NEGATIVE NEGATIVE  Troponin I (High Sensitivity)  Result Value Ref Range   Troponin I (High Sensitivity) 11 <18 ng/L      Assessment & Plan:   Problem List Items Addressed This Visit      Respiratory   Pneumonia    Acute, was suspected due to Covid, but all Covid testing has been negative.  No flu testing done.  At this time is beginning to improve, but not 100%.  Will add on Augmentin for double treatment with CAP.  Send in 5 more days Prednisone and refill on cough medication.  Recommend: - Increased rest - Increasing Fluids - Acetaminophen as needed for fever/pain.  If worsening symptoms be seen immediately.  At this time plan on follow-up in 4 weeks for in office visit with labs and repeat CXR.        Relevant Medications   amoxicillin-clavulanate (AUGMENTIN) 875-125 MG tablet   chlorpheniramine-HYDROcodone (TUSSIONEX PENNKINETIC ER) 10-8 MG/5ML SUER       I discussed the assessment and treatment plan with the patient. The patient was provided an opportunity to ask questions and all were answered. The patient agreed with the plan and demonstrated an understanding of the instructions.   The patient was advised to call back or seek an in-person evaluation if the symptoms worsen or if the condition fails to improve as anticipated.   I provided 21+ minutes of time during this encounter.  Follow up plan: Return in about 4 weeks (around 03/08/2020) for Pneumonia follow-up in office with repeat CXR + labs CBC/BMP + diabetes visit.

## 2020-02-09 NOTE — Assessment & Plan Note (Signed)
Acute, was suspected due to Covid, but all Covid testing has been negative.  No flu testing done.  At this time is beginning to improve, but not 100%.  Will add on Augmentin for double treatment with CAP.  Send in 5 more days Prednisone and refill on cough medication.  Recommend: - Increased rest - Increasing Fluids - Acetaminophen as needed for fever/pain.  If worsening symptoms be seen immediately.  At this time plan on follow-up in 4 weeks for in office visit with labs and repeat CXR.

## 2020-03-02 ENCOUNTER — Other Ambulatory Visit: Payer: Self-pay | Admitting: Nurse Practitioner

## 2020-03-08 ENCOUNTER — Other Ambulatory Visit: Payer: Self-pay

## 2020-03-08 ENCOUNTER — Ambulatory Visit (INDEPENDENT_AMBULATORY_CARE_PROVIDER_SITE_OTHER): Payer: Medicare Other | Admitting: Nurse Practitioner

## 2020-03-08 ENCOUNTER — Encounter: Payer: Self-pay | Admitting: Nurse Practitioner

## 2020-03-08 VITALS — BP 130/77 | HR 81 | Temp 97.9°F | Ht 68.62 in | Wt 352.0 lb

## 2020-03-08 DIAGNOSIS — I5032 Chronic diastolic (congestive) heart failure: Secondary | ICD-10-CM

## 2020-03-08 DIAGNOSIS — I130 Hypertensive heart and chronic kidney disease with heart failure and stage 1 through stage 4 chronic kidney disease, or unspecified chronic kidney disease: Secondary | ICD-10-CM | POA: Diagnosis not present

## 2020-03-08 DIAGNOSIS — N182 Chronic kidney disease, stage 2 (mild): Secondary | ICD-10-CM | POA: Diagnosis not present

## 2020-03-08 DIAGNOSIS — R809 Proteinuria, unspecified: Secondary | ICD-10-CM

## 2020-03-08 DIAGNOSIS — Z794 Long term (current) use of insulin: Secondary | ICD-10-CM | POA: Diagnosis not present

## 2020-03-08 DIAGNOSIS — R7989 Other specified abnormal findings of blood chemistry: Secondary | ICD-10-CM

## 2020-03-08 DIAGNOSIS — E1129 Type 2 diabetes mellitus with other diabetic kidney complication: Secondary | ICD-10-CM | POA: Diagnosis not present

## 2020-03-08 DIAGNOSIS — E119 Type 2 diabetes mellitus without complications: Secondary | ICD-10-CM

## 2020-03-08 DIAGNOSIS — G4733 Obstructive sleep apnea (adult) (pediatric): Secondary | ICD-10-CM

## 2020-03-08 DIAGNOSIS — E785 Hyperlipidemia, unspecified: Secondary | ICD-10-CM | POA: Diagnosis not present

## 2020-03-08 DIAGNOSIS — Z1211 Encounter for screening for malignant neoplasm of colon: Secondary | ICD-10-CM

## 2020-03-08 DIAGNOSIS — J189 Pneumonia, unspecified organism: Secondary | ICD-10-CM | POA: Diagnosis not present

## 2020-03-08 DIAGNOSIS — E1169 Type 2 diabetes mellitus with other specified complication: Secondary | ICD-10-CM | POA: Diagnosis not present

## 2020-03-08 DIAGNOSIS — Z6841 Body Mass Index (BMI) 40.0 and over, adult: Secondary | ICD-10-CM

## 2020-03-08 LAB — MICROALBUMIN, URINE WAIVED
Creatinine, Urine Waived: 50 mg/dL (ref 10–300)
Microalb, Ur Waived: 80 mg/L — ABNORMAL HIGH (ref 0–19)

## 2020-03-08 LAB — BAYER DCA HB A1C WAIVED: HB A1C (BAYER DCA - WAIVED): 7 % — ABNORMAL HIGH (ref <7.0)

## 2020-03-08 MED ORDER — BYDUREON BCISE 2 MG/0.85ML ~~LOC~~ AUIJ
AUTO-INJECTOR | SUBCUTANEOUS | 4 refills | Status: DC
Start: 2020-03-08 — End: 2020-12-25

## 2020-03-08 MED ORDER — ATORVASTATIN CALCIUM 10 MG PO TABS: ORAL_TABLET | ORAL | 4 refills | Status: DC

## 2020-03-08 NOTE — Assessment & Plan Note (Signed)
Continue consistent use of CPAP every night.

## 2020-03-08 NOTE — Assessment & Plan Note (Signed)
Chronic, ongoing with BP at goal today.  Continue current medication regimen and collaboration with cardiology.  Recommend checking BP three mornings a week at home + documenting for providers.  BMP and TSH today.  Recommend DASH diet focus and avoidance of high sodium foods, praised for cutting back on pork skins.  Return in 3 months.

## 2020-03-08 NOTE — Assessment & Plan Note (Signed)
Chronic, ongoing.  Followed by endo.  A1C 7.0% today, praised for ongoing success.  Continue collaboration with endocrinology.  Continue current medication regimen and adjust as needed.  Recommend she continue to monitor BS at home 2-3 times a day.  Refills as needed.  Return in 3 months.

## 2020-03-08 NOTE — Assessment & Plan Note (Addendum)
Chronic, ongoing with recent LDL 77 and TCHOL 131 at recent visit.  Continue current medication regimen as is at goal.  Lipid panel today.  Refills sent in.

## 2020-03-08 NOTE — Assessment & Plan Note (Signed)
Acute and improved at this time with clear lungs on check.  Will repeat CXR to further assess.

## 2020-03-08 NOTE — Patient Instructions (Signed)
Diabetes Mellitus and Nutrition, Adult When you have diabetes, or diabetes mellitus, it is very important to have healthy eating habits because your blood sugar (glucose) levels are greatly affected by what you eat and drink. Eating healthy foods in the right amounts, at about the same times every day, can help you:  Control your blood glucose.  Lower your risk of heart disease.  Improve your blood pressure.  Reach or maintain a healthy weight. What can affect my meal plan? Every person with diabetes is different, and each person has different needs for a meal plan. Your health care provider may recommend that you work with a dietitian to make a meal plan that is best for you. Your meal plan may vary depending on factors such as:  The calories you need.  The medicines you take.  Your weight.  Your blood glucose, blood pressure, and cholesterol levels.  Your activity level.  Other health conditions you have, such as heart or kidney disease. How do carbohydrates affect me? Carbohydrates, also called carbs, affect your blood glucose level more than any other type of food. Eating carbs naturally raises the amount of glucose in your blood. Carb counting is a method for keeping track of how many carbs you eat. Counting carbs is important to keep your blood glucose at a healthy level, especially if you use insulin or take certain oral diabetes medicines. It is important to know how many carbs you can safely have in each meal. This is different for every person. Your dietitian can help you calculate how many carbs you should have at each meal and for each snack. How does alcohol affect me? Alcohol can cause a sudden decrease in blood glucose (hypoglycemia), especially if you use insulin or take certain oral diabetes medicines. Hypoglycemia can be a life-threatening condition. Symptoms of hypoglycemia, such as sleepiness, dizziness, and confusion, are similar to symptoms of having too much  alcohol.  Do not drink alcohol if: ? Your health care provider tells you not to drink. ? You are pregnant, may be pregnant, or are planning to become pregnant.  If you drink alcohol: ? Do not drink on an empty stomach. ? Limit how much you use to:  0-1 drink a day for women.  0-2 drinks a day for men. ? Be aware of how much alcohol is in your drink. In the [place] one drink equals one 12 oz bottle of beer (355 mL), one 5 oz glass of wine (148 mL), or one 1 oz glass of hard liquor (44 mL). ? Keep yourself hydrated with water, diet soda, or unsweetened iced tea.  Keep in mind that regular soda, juice, and other mixers may contain a lot of sugar and must be counted as carbs. What are tips for following this plan? Reading food labels  Start by checking the serving size on the "Nutrition Facts" label of packaged foods and drinks. The amount of calories, carbs, fats, and other nutrients listed on the label is based on one serving of the item. Many items contain more than one serving per package.  Check the total grams (g) of carbs in one serving. You can calculate the number of servings of carbs in one serving by dividing the total carbs by 15. For example, if a food has 30 g of total carbs per serving, it would be equal to 2 servings of carbs.  Check the number of grams (g) of saturated fats and trans fats in one serving. Choose foods that have   a low amount or none of these fats.  Check the number of milligrams (mg) of salt (sodium) in one serving. Most people should limit total sodium intake to less than 2,300 mg per day.  Always check the nutrition information of foods labeled as "low-fat" or "nonfat." These foods may be higher in added sugar or refined carbs and should be avoided.  Talk to your dietitian to identify your daily goals for nutrients listed on the label. Shopping  Avoid buying canned, pre-made, or processed foods. These foods tend to be high in fat, sodium, and added  sugar.  Shop around the outside edge of the grocery store. This is where you will most often find fresh fruits and vegetables, bulk grains, fresh meats, and fresh dairy. Cooking  Use low-heat cooking methods, such as baking, instead of high-heat cooking methods like deep frying.  Cook using healthy oils, such as olive, canola, or sunflower oil.  Avoid cooking with butter, cream, or high-fat meats. Meal planning  Eat meals and snacks regularly, preferably at the same times every day. Avoid going long periods of time without eating.  Eat foods that are high in fiber, such as fresh fruits, vegetables, beans, and whole grains. Talk with your dietitian about how many servings of carbs you can eat at each meal.  Eat 4-6 oz (112-168 g) of lean protein each day, such as lean meat, chicken, fish, eggs, or tofu. One ounce (oz) of lean protein is equal to: ? 1 oz (28 g) of meat, chicken, or fish. ? 1 egg. ?  cup (62 g) of tofu.  Eat some foods each day that contain healthy fats, such as avocado, nuts, seeds, and fish.   What foods should I eat? Fruits Berries. Apples. Oranges. Peaches. Apricots. Plums. Grapes. Mango. Papaya. Pomegranate. Kiwi. Cherries. Vegetables Lettuce. Spinach. Leafy greens, including kale, chard, collard greens, and mustard greens. Beets. Cauliflower. Cabbage. Broccoli. Carrots. Green beans. Tomatoes. Peppers. Onions. Cucumbers. Brussels sprouts. Grains Whole grains, such as whole-wheat or whole-grain bread, crackers, tortillas, cereal, and pasta. Unsweetened oatmeal. Quinoa. Brown or wild rice. Meats and other proteins Seafood. Poultry without skin. Lean cuts of poultry and beef. Tofu. Nuts. Seeds. Dairy Low-fat or fat-free dairy products such as milk, yogurt, and cheese. The items listed above may not be a complete list of foods and beverages you can eat. Contact a dietitian for more information. What foods should I avoid? Fruits Fruits canned with  syrup. Vegetables Canned vegetables. Frozen vegetables with butter or cream sauce. Grains Refined white flour and flour products such as bread, pasta, snack foods, and cereals. Avoid all processed foods. Meats and other proteins Fatty cuts of meat. Poultry with skin. Breaded or fried meats. Processed meat. Avoid saturated fats. Dairy Full-fat yogurt, cheese, or milk. Beverages Sweetened drinks, such as soda or iced tea. The items listed above may not be a complete list of foods and beverages you should avoid. Contact a dietitian for more information. Questions to ask a health care provider  Do I need to meet with a diabetes educator?  Do I need to meet with a dietitian?  What number can I call if I have questions?  When are the best times to check my blood glucose? Where to find more information:  American Diabetes Association: diabetes.org  Academy of Nutrition and Dietetics: www.eatright.org  National Institute of Diabetes and Digestive and Kidney Diseases: www.niddk.nih.gov  Association of Diabetes Care and Education Specialists: www.diabeteseducator.org Summary  It is important to have healthy eating   habits because your blood sugar (glucose) levels are greatly affected by what you eat and drink.  A healthy meal plan will help you control your blood glucose and maintain a healthy lifestyle.  Your health care provider may recommend that you work with a dietitian to make a meal plan that is best for you.  Keep in mind that carbohydrates (carbs) and alcohol have immediate effects on your blood glucose levels. It is important to count carbs and to use alcohol carefully. This information is not intended to replace advice given to you by your health care provider. Make sure you discuss any questions you have with your health care provider. Document Revised: 12/29/2018 Document Reviewed: 12/29/2018 Elsevier Patient Education  2021 Elsevier Inc.  

## 2020-03-08 NOTE — Progress Notes (Signed)
BP 130/77   Pulse 81   Temp 97.9 F (36.6 C) (Oral)   Ht 5' 8.62" (1.743 m)   Wt (!) 352 lb (159.7 kg)   SpO2 95%   BMI 52.56 kg/m    Subjective:    Patient ID: Sara Zhang, female    DOB: Dec 30, 1970, 50 y.o.   MRN: [id number]  HPI: Sara Zhang is a 50 y.o. female  Chief Complaint  Patient presents with  . F/U Pneumonia    Patient states she is feeling better  . Diabetes   PNEUMONIA Seen in ER on 02/05/2020 and treated for suspected pneumonia from Covid. Imaging noting "Patchy and somewhat nodular perihilar opacities, question atypical infection".  She has history of Covid with admission in December 2020.  Her current Covid tests are all negative.  She was sent home with Azithromycin and Prednisone -- had Augmentin added on at virtual visit 02/09/20.  She feels 100% better. Denies any fevers, chills, red eye, myalgias. .  Fever: no Cough: no Shortness of breath: no Wheezing: no Chest pain: no Chest tightness: no Chest congestion: no Nasal congestion: no Runny nose: no Post nasal drip: no Sneezing: no Sore throat: no Swollen glands: no Sinus pressure: no Headache: no Face pain: no Toothache: no Vomiting: no Rash: no Fatigue: no  DIABETES Current medications include Jardiance 25 MG, Toujeo 54 units QHS, and Bydureon 2 MG weekly.A1C in September 7.2%. She is followed by endocrinology and her last visit with them was 10/27/19, no changes made on review of note.  She reports returning in February, this month.  Today A1c 7%, urine ALB 80. Polydipsia/polyuria: no Visual disturbance: no Chest pain: no Paresthesias: no Glucose Monitoring: yes Accucheck frequency: machine broke Fasting glucose:  Evening:  Before meals: Taking Insulin?: yes Long acting insulin: 54 units Short acting insulin: Blood Pressure Monitoring: not checking Retinal Examination: Up to Date Foot Exam: Up to  Date Pneumovax: Up to Date Influenza: Not Up to Date Aspirin: no  HYPERTENSION / HYPERLIPIDEMIA/HF Current medications include Lisinopril, Furosemide, Amlodipine, Atenolol for HTN + Lipitor for HLD. Followed by cardiology and last saw Dr. End10/4/21, no changes made.Is working on diet and has cut way back on pork skins.  Last echo in 2020 October was 55-60% Satisfied with current treatment? yes Duration of hypertension: chronic BP monitoring frequency: not checking BP range:  BP medication side effects: no Duration of hyperlipidemia: chronic Cholesterol medication side effects: no Cholesterol supplements: none Medication compliance: good compliance Aspirin: no Recent stressors: no Recurrent headaches:no Visual changes: no Palpitations: no Dyspnea: occasional -- wears her O2 at 2L at night Chest pain: no Lower extremity edema: occasional -- when eats salty foods Dizzy/lightheaded: no  Relevant past medical, surgical, family and social history reviewed and updated as indicated. Interim medical history since our last visit reviewed. Allergies and medications reviewed and updated.  Review of Systems  Constitutional: Negative for activity change, appetite change, diaphoresis, fatigue and fever.  Respiratory: Positive for shortness of breath (at baseline, no worse). Negative for cough, chest tightness and wheezing.   Cardiovascular: Positive for leg swelling (at baseline, no worse). Negative for chest pain and palpitations.  Gastrointestinal: Negative.   Endocrine: Negative for polydipsia, polyphagia and polyuria.  Neurological: Negative.   Psychiatric/Behavioral: Negative.     Per HPI unless specifically indicated above     Objective:    BP 130/77   Pulse 81   Temp 97.9 F (36.6 C) (Oral)   Ht 5' 8.62" (1.743  m)   Wt (!) 352 lb (159.7 kg)   SpO2 95%   BMI 52.56 kg/m   Wt Readings from Last 3 Encounters:  03/08/20 (!) 352 lb (159.7 kg)  02/04/20 260 lb (117.9  kg)  12/22/19 (!) 356 lb 12.8 oz (161.8 kg)    Physical Exam Vitals and nursing note reviewed.  Constitutional:      General: She is awake. She is not in acute distress.    Appearance: She is well-developed. She is morbidly obese. She is not ill-appearing.  HENT:     Head: Normocephalic.     Right Ear: Hearing normal.     Left Ear: Hearing normal.  Eyes:     General: Lids are normal.        Right eye: No discharge.        Left eye: No discharge.     Conjunctiva/sclera: Conjunctivae normal.     Pupils: Pupils are equal, round, and reactive to light.  Neck:     Thyroid: No thyromegaly.     Vascular: No carotid bruit.  Cardiovascular:     Rate and Rhythm: Normal rate and regular rhythm.     Heart sounds: Normal heart sounds. No murmur heard. No gallop.   Pulmonary:     Effort: Pulmonary effort is normal. No accessory muscle usage or respiratory distress.     Breath sounds: Normal breath sounds.     Comments: Lungs clear throughout. Abdominal:     General: Bowel sounds are normal.     Palpations: Abdomen is soft.  Musculoskeletal:     Cervical back: Normal range of motion and neck supple.     Right lower leg: No edema.     Left lower leg: No edema.  Skin:    General: Skin is warm and dry.  Neurological:     Mental Status: She is alert and oriented to person, place, and time.  Psychiatric:        Attention and Perception: Attention normal.        Mood and Affect: Mood normal.        Speech: Speech normal.        Behavior: Behavior normal. Behavior is cooperative.        Thought Content: Thought content normal.    Results for orders placed or performed during the hospital encounter of 02/05/20  CBC with Differential  Result Value Ref Range   WBC 13.5 (H) 4.0 - 10.5 K/uL   RBC 4.80 3.87 - 5.11 MIL/uL   Hemoglobin 12.3 12.0 - 15.0 g/dL   HCT 41.8 36.0 - 46.0 %   MCV 87.1 80.0 - 100.0 fL   MCH 25.6 (L) 26.0 - 34.0 pg   MCHC 29.4 (L) 30.0 - 36.0 g/dL   RDW 15.3 11.5 -  15.5 %   Platelets 344 150 - 400 K/uL   nRBC 0.0 0.0 - 0.2 %   Neutrophils Relative % 71 %   Neutro Abs 9.8 (H) 1.7 - 7.7 K/uL   Lymphocytes Relative 16 %   Lymphs Abs 2.2 0.7 - 4.0 K/uL   Monocytes Relative 9 %   Monocytes Absolute 1.2 (H) 0.1 - 1.0 K/uL   Eosinophils Relative 2 %   Eosinophils Absolute 0.2 0.0 - 0.5 K/uL   Basophils Relative 1 %   Basophils Absolute 0.1 0.0 - 0.1 K/uL   Immature Granulocytes 1 %   Abs Immature Granulocytes 0.08 (H) 0.00 - 0.07 K/uL  Basic metabolic panel  Result Value Ref  Range   Sodium 140 135 - 145 mmol/L   Potassium 3.4 (L) 3.5 - 5.1 mmol/L   Chloride 100 98 - 111 mmol/L   CO2 29 22 - 32 mmol/L   Glucose, Bld 119 (H) 70 - 99 mg/dL   BUN 18 6 - 20 mg/dL   Creatinine, Ser 0.91 0.44 - 1.00 mg/dL   Calcium 8.3 (L) 8.9 - 10.3 mg/dL   GFR, Estimated >60 >60 mL/min   Anion gap 11 5 - 15  POC SARS Coronavirus 2 Ag-ED - Nasal Swab (BD Veritor Kit)  Result Value Ref Range   SARS Coronavirus 2 Ag NEGATIVE NEGATIVE  Troponin I (High Sensitivity)  Result Value Ref Range   Troponin I (High Sensitivity) 11 <18 ng/L      Assessment & Plan:   Problem List Items Addressed This Visit      Cardiovascular and Mediastinum   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  Continue current medication regimen and collaboration with cardiology.  Recommend checking BP three mornings a week at home + documenting for providers.  BMP and TSH today.  Recommend DASH diet focus and avoidance of high sodium foods, praised for cutting back on pork skins.  Return in 3 months.      Relevant Medications   atorvastatin (LIPITOR) 10 MG tablet   Other Relevant Orders   Basic metabolic panel   TSH   Chronic heart failure with preserved ejection fraction (HCC)    Chronic, ongoing with BP at goal.  Continue current medication regimen and collaboration with cardiology.  Praised for  cutting back on pork skins and recommend to continue this cut back.  Recommend: - Reminded to call for an overnight weight gain of >2 pounds or a weekly weight weight of >5 pounds - not adding salt to his food and has been reading food labels. Reviewed the importance of keeping daily sodium intake to <2027m daily -- NO PORK SKINS      Relevant Medications   atorvastatin (LIPITOR) 10 MG tablet     Respiratory   Obstructive sleep apnea    Continue consistent use of CPAP every night.      Pneumonia    Acute and improved at this time with clear lungs on check.  Will repeat CXR to further assess.      Relevant Orders   DG Chest 2 View     Endocrine   Hyperlipidemia associated with type 2 diabetes mellitus ([place]    Chronic, ongoing with recent LDL 77 and TCHOL 131 at recent visit.  Continue current medication regimen as is at goal.  Lipid panel today.  Refills sent in.      Relevant Medications   atorvastatin (LIPITOR) 10 MG tablet   Exenatide ER (BYDUREON BCISE) 2 MG/0.85ML AUIJ   Other Relevant Orders   Bayer DCA Hb A1c Waived   Lipid Panel w/o Chol/HDL Ratio   Type 2 diabetes mellitus treated with insulin (HCC) - Primary    Chronic, ongoing.  Followed by endo.  A1C 7.0% today, praised for ongoing success.  Continue collaboration with endocrinology.  Continue current medication regimen and adjust as needed.  Recommend she continue to monitor BS at home 2-3 times a day.  Refills as needed.  Return in 3 months.       Relevant Medications   atorvastatin (LIPITOR) 10 MG tablet   Exenatide ER (BYDUREON  BCISE) 2 MG/0.85ML AUIJ   Proteinuria due to type 2 diabetes mellitus (HCC)    Chronic, ongoing.  Followed by endo.  A1C 7% today and urine ALB 80, praised for ongoing success with A1c.  Continue collaboration with endocrinology.  Continue current medication regimen and adjust as needed, Lisinopril offering kidney protection.  Recommend she continue to monitor BS at home 2-3 times a  day.  Refills as needed.  Return in 3 months.       Relevant Medications   atorvastatin (LIPITOR) 10 MG tablet   Exenatide ER (BYDUREON BCISE) 2 MG/0.85ML AUIJ   Other Relevant Orders   Bayer DCA Hb A1c Waived   Basic metabolic panel   Microalbumin, Urine Waived     Other   Morbid obesity (HCC)    BMI 52.56 with HTN, HF, T2DM.  Recommended eating smaller high protein, low fat meals more frequently and exercising 30 mins a day 5 times a week with a goal of 10-15lb weight loss in the next 3 months. Patient voiced their understanding and motivation to adhere to these recommendations.       Relevant Medications   Exenatide ER (BYDUREON BCISE) 2 MG/0.85ML AUIJ   Elevated TSH    History of elevation on past labs, recheck today.  Consider medication initiation as needed.      BMI 50.0-59.9, adult [facility])    Refer to morbid obesity plan of care.      Relevant Medications   Exenatide ER (BYDUREON BCISE) 2 MG/0.85ML AUIJ    Other Visit Diagnoses    Colon cancer screening       Cologuard ordered   Relevant Orders   Cologuard       Follow up plan: Return in about 3 months (around 06/05/2020) for T2DM, HTN/HLD, OSA, Vit D check.

## 2020-03-08 NOTE — Assessment & Plan Note (Signed)
Refer to morbid obesity plan of care. 

## 2020-03-08 NOTE — Assessment & Plan Note (Signed)
Chronic, ongoing.  Followed by endo.  A1C 7% today and urine ALB 80, praised for ongoing success with A1c.  Continue collaboration with endocrinology.  Continue current medication regimen and adjust as needed, Lisinopril offering kidney protection.  Recommend she continue to monitor BS at home 2-3 times a day.  Refills as needed.  Return in 3 months.

## 2020-03-08 NOTE — Assessment & Plan Note (Signed)
Chronic, ongoing with BP at goal.  Continue current medication regimen and collaboration with cardiology.  Praised for cutting back on pork skins and recommend to continue this cut back.  Recommend: - Reminded to call for an overnight weight gain of >2 pounds or a weekly weight weight of >5 pounds - not adding salt to his food and has been reading food labels. Reviewed the importance of keeping daily sodium intake to 2000mg  daily -- NO PORK SKINS

## 2020-03-08 NOTE — Assessment & Plan Note (Signed)
History of elevation on past labs, recheck today.  Consider medication initiation as needed.

## 2020-03-08 NOTE — Assessment & Plan Note (Signed)
BMI 52.56 with HTN, HF, T2DM.  Recommended eating smaller high protein, low fat meals more frequently and exercising 30 mins a day 5 times a week with a goal of 10-15lb weight loss in the next 3 months. Patient voiced their understanding and motivation to adhere to these recommendations.

## 2020-03-09 LAB — BASIC METABOLIC PANEL
BUN/Creatinine Ratio: 18 (ref 9–23)
BUN: 22 mg/dL (ref 6–24)
CO2: 24 mmol/L (ref 20–29)
Calcium: 7.9 mg/dL — ABNORMAL LOW (ref 8.7–10.2)
Chloride: 102 mmol/L (ref 96–106)
Creatinine, Ser: 1.19 mg/dL — ABNORMAL HIGH (ref 0.57–1.00)
GFR calc Af Amer: 61 mL/min/{1.73_m2} (ref >59)
GFR calc non Af Amer: 53 mL/min/{1.73_m2} — ABNORMAL LOW (ref >59)
Glucose: 140 mg/dL — ABNORMAL HIGH (ref 65–99)
Potassium: 4.2 mmol/L (ref 3.5–5.2)
Sodium: 141 mmol/L (ref 134–144)

## 2020-03-09 LAB — LIPID PANEL W/O CHOL/HDL RATIO
Cholesterol, Total: 139 mg/dL (ref 100–199)
HDL: 47 mg/dL (ref >39)
LDL Chol Calc (NIH): 78 mg/dL (ref 0–99)
Triglycerides: 68 mg/dL (ref 0–149)
VLDL Cholesterol Cal: 14 mg/dL (ref 5–40)

## 2020-03-09 LAB — TSH: TSH: 2 u[IU]/mL (ref 0.450–4.500)

## 2020-03-09 NOTE — Progress Notes (Signed)
Contacted via MyChart   Good evening Sara Zhang, your labs have returned.  You are showing some mild kidney disease, noted previously, but no decline.  Continue Lisinopril which offers kidney protection.  Cholesterol levels at goal and thyroid normal.  Any questions? Keep being awesome!!  Thank you for allowing me to participate in your care. Kindest regards, Mitsuko Luera

## 2020-03-16 ENCOUNTER — Ambulatory Visit
Admission: RE | Admit: 2020-03-16 | Discharge: 2020-03-16 | Disposition: A | Discharge status: Home / Self Care | Payer: Medicare Other | Source: Ambulatory Visit | Attending: Nurse Practitioner | Admitting: Nurse Practitioner

## 2020-03-16 ENCOUNTER — Other Ambulatory Visit: Payer: Self-pay

## 2020-03-16 ENCOUNTER — Ambulatory Visit
Admission: RE | Admit: 2020-03-16 | Discharge: 2020-03-16 | Disposition: A | Discharge status: Home / Self Care | Payer: Medicare Other | Attending: Nurse Practitioner | Admitting: Nurse Practitioner

## 2020-03-16 DIAGNOSIS — J189 Pneumonia, unspecified organism: Secondary | ICD-10-CM | POA: Insufficient documentation

## 2020-03-16 NOTE — Progress Notes (Signed)
Contacted via MyChart   Good news this evening Sara Zhang!!  Your pneumonia has resolved.  Have a good evening!! Keep being awesome!!  Thank you for allowing me to participate in your care. Kindest regards, Corrie Dandy'

## 2020-03-20 ENCOUNTER — Encounter: Payer: Self-pay | Admitting: Podiatry

## 2020-03-20 ENCOUNTER — Other Ambulatory Visit: Payer: Self-pay | Admitting: Internal Medicine

## 2020-03-20 ENCOUNTER — Other Ambulatory Visit: Payer: Self-pay

## 2020-03-20 ENCOUNTER — Ambulatory Visit (INDEPENDENT_AMBULATORY_CARE_PROVIDER_SITE_OTHER): Payer: Medicare Other | Admitting: Podiatry

## 2020-03-20 DIAGNOSIS — L600 Ingrowing nail: Secondary | ICD-10-CM

## 2020-03-20 DIAGNOSIS — M79676 Pain in unspecified toe(s): Secondary | ICD-10-CM

## 2020-03-20 DIAGNOSIS — B351 Tinea unguium: Secondary | ICD-10-CM

## 2020-03-20 DIAGNOSIS — E119 Type 2 diabetes mellitus without complications: Secondary | ICD-10-CM | POA: Diagnosis not present

## 2020-03-20 NOTE — Progress Notes (Signed)
She presents today for follow-up of her diabetes and a chief complaint of painful ingrown toenail tibial border hallux left.  She states the rest of the toes are also tender and she would like to have those trimmed as well.  She states that I am concerned about the fibular border on the right foot but it is not tender as of yet.  States that her last hemoglobin A1c was at 7.0  Objective: Vital signs are stable she is alert and oriented x3 there is no erythema edema cellulitis drainage or odor with exception of the mild erythema along the tibial border of the hallux left.  Sharp incurvated nail margin is present and tender on palpation.  No purulence no malodor.  Otherwise the remainder of the nails appear to be thick yellow dystrophic possibly mycotic.  Assessment: Diabetes mellitus with ingrown nail hallux left well-controlled diabetes.  Plan: Discussed etiology pathology conservative versus surgical therapies at this point debrided her nails 1 through 5 bilaterally.  Performed chemical matricectomy to the tibial border of the hallux left.  She tolerated the procedure well without complications was provided both oral and written home-going structure for care and soaking of the toe as well as a prescription for Cortisporin Otic to be applied twice daily after soaking.  I will follow-up with her in about 2 weeks just to make sure she is doing well otherwise follow-up with her on an as-needed basis for routine nail debridement.

## 2020-03-20 NOTE — Patient Instructions (Signed)

## 2020-03-23 ENCOUNTER — Ambulatory Visit: Payer: Medicare Other | Admitting: Nurse Practitioner

## 2020-03-26 DIAGNOSIS — Z1212 Encounter for screening for malignant neoplasm of rectum: Secondary | ICD-10-CM | POA: Diagnosis not present

## 2020-03-26 DIAGNOSIS — Z1211 Encounter for screening for malignant neoplasm of colon: Secondary | ICD-10-CM | POA: Diagnosis not present

## 2020-03-26 LAB — COLOGUARD: Cologuard: NEGATIVE

## 2020-03-27 ENCOUNTER — Ambulatory Visit (INDEPENDENT_AMBULATORY_CARE_PROVIDER_SITE_OTHER): Payer: Medicare Other | Admitting: Internal Medicine

## 2020-03-27 VITALS — BP 132/76 | HR 83 | Temp 98.6°F | Resp 16 | Ht 69.0 in | Wt 345.0 lb

## 2020-03-27 DIAGNOSIS — G4733 Obstructive sleep apnea (adult) (pediatric): Secondary | ICD-10-CM | POA: Diagnosis not present

## 2020-03-27 DIAGNOSIS — Z7189 Other specified counseling: Secondary | ICD-10-CM | POA: Insufficient documentation

## 2020-03-27 DIAGNOSIS — I5032 Chronic diastolic (congestive) heart failure: Secondary | ICD-10-CM

## 2020-03-27 DIAGNOSIS — Z6841 Body Mass Index (BMI) 40.0 and over, adult: Secondary | ICD-10-CM

## 2020-03-27 DIAGNOSIS — E119 Type 2 diabetes mellitus without complications: Secondary | ICD-10-CM

## 2020-03-27 DIAGNOSIS — Z794 Long term (current) use of insulin: Secondary | ICD-10-CM | POA: Diagnosis not present

## 2020-03-27 DIAGNOSIS — Z9989 Dependence on other enabling machines and devices: Secondary | ICD-10-CM | POA: Diagnosis not present

## 2020-03-27 DIAGNOSIS — I428 Other cardiomyopathies: Secondary | ICD-10-CM | POA: Diagnosis not present

## 2020-03-27 NOTE — Patient Instructions (Signed)

## 2020-03-27 NOTE — Progress Notes (Signed)
[facility] [place], [place] [address]  Pulmonary Sleep Medicine   Office Visit Note  Patient Name: Sara Zhang DOB: 01/12/1971 MRN [id number]    Chief Complaint: Obstructive Sleep Apnea visit  Brief History:  Sara Zhang is seen today for follow up The patient has a 7 year history of sleep apnea. Patient is using PAP nightly.  The patient feels more rested after sleeping with PAP.  The patient reports benefiting from PAP use. Reported sleepiness is  improved and the Epworth Sleepiness Score is 5 out of 24.She catches cold easily and was sick several times this year including pneumonia. The patient occassionally take naps. The patient complains of the following: mask leak  The compliance download shows very good compliance with an average use time of 7 hours. The AHI is 1.1  The patient does not complain of limb movements disrupting sleep.  ROS  General: (-) fever, (-) chills, (-) night sweat Nose and Sinuses: (-) nasal stuffiness or itchiness, (-) postnasal drip, (-) nosebleeds, (-) sinus trouble. Mouth and Throat: (-) sore throat, (-) hoarseness. Neck: (-) swollen glands, (-) enlarged thyroid, (-) neck pain. Respiratory: - cough, - shortness of breath, - wheezing. Neurologic: - numbness, - tingling. Psychiatric: - anxiety, - depression   Current Medication: Outpatient Encounter Medications as of 03/27/2020  Medication Sig  . amLODipine (NORVASC) 5 MG tablet TAKE 1 TABLET(5 MG) BY MOUTH DAILY  . atenolol (TENORMIN) 50 MG tablet Take 1 tablet (50 mg total) by mouth daily.  Marland Kitchen atorvastatin (LIPITOR) 10 MG tablet TAKE 1 TABLET(10 MG) BY MOUTH DAILY  . blood glucose meter kit and supplies KIT Dispense based on patient and insurance preference. Use up to four times daily as directed. (FOR ICD-9 250.00, 250.01).  . Blood Glucose Monitoring Suppl (CONTOUR NEXT MONITOR) w/Device KIT To check blood sugar 4 times daily.  . Calcium Carbonate-Vitamin D (OYSTER SHELL  CALCIUM 500 + D) 500-125 MG-UNIT TABS Take 1 tablet by mouth daily.   . Cholecalciferol (VITAMIN D3) 10 MCG (400 UNIT) tablet Take 400 Units by mouth daily.  . Exenatide ER (BYDUREON BCISE) 2 MG/0.85ML AUIJ INJECT 2 MG UNDER THE SKIN EVERY WEEK  . furosemide (LASIX) 40 MG tablet Take 1 tablet (40 mg total) by mouth 2 (two) times daily.  Marland Kitchen glucose blood (CONTOUR NEXT TEST) test strip TEST BLOOD SUGAR ONCE DAILY  . Insulin Glargine (TOUJEO SOLOSTAR) 300 UNIT/ML SOPN Inject 54 Units into the skin at bedtime.  . Insulin Pen Needle 32G X 4 MM MISC 1 Units by Does not apply route every morning. Pen needles  . JARDIANCE 25 MG TABS tablet Take 1 tablet (25 mg total) by mouth daily.  Marland Kitchen levonorgestrel (MIRENA) 20 MCG/24HR IUD 1 each by Intrauterine route once.   Marland Kitchen lisinopril (ZESTRIL) 40 MG tablet TAKE 1 TABLET BY MOUTH EVERY DAY  . OXYGEN Inhale 2 L into the lungs daily.   No facility-administered encounter medications on file as of 03/27/2020.    Surgical History: Past Surgical History:  Procedure Laterality Date  . CESAREAN SECTION    . CHOLECYSTECTOMY    . DILATION AND CURETTAGE, DIAGNOSTIC / THERAPEUTIC    . TONSILLECTOMY AND ADENOIDECTOMY    . TOOTH EXTRACTION  01/2016    Medical History: Past Medical History:  Diagnosis Date  . Abnormal uterine bleeding (AUB) 11/25/2016  . Chronic diastolic CHF (congestive heart failure) ([place])    a. 02/2012 Echo: EF 25-35%, glob HK; b. 06/2016 Echo: EF 55-60%, mild  MR, nl PASP.  Marland Kitchen CKD (chronic kidney disease), stage II   . COVID-19   . Diabetes mellitus without complication ([place])   . Extreme obesity   . Hyperlipidemia   . Hypertension   . Hypoxia   . Microcytic anemia   . Pneumonia   . Pneumonia   . Sleep apnea   . Status post endometrial ablation 06/20/2016   2015 hysteroscopy/D&C with NovaSure endometrial ablation; pathology-benign endometrial polyps  . Thickened endometrium 06/20/2016   Ultrasound-22.3 mm endometrial stripe, heterogenous  .  Vaginal bleeding    a. 01/2017 s/p hyteroscopy and polypectomy [facility]).  . Vitamin D deficiency     Family History: Non contributory to the present illness  Social History: Social History   Socioeconomic History  . Marital status: Single    Spouse name: Not on file  . Number of children: Not on file  . Years of education: GED  . Highest education level: GED or equivalent  Occupational History  . Occupation: disability  Tobacco Use  . Smoking status: Never Smoker  . Smokeless tobacco: Never Used  Vaping Use  . Vaping Use: Never used  Substance and Sexual Activity  . Alcohol use: Yes    Alcohol/week: 0.0 standard drinks    Comment: 1-2 beers/month  . Drug use: No  . Sexual activity: Yes    Birth control/protection: None  Other Topics Concern  . Not on file  Social History Narrative   The patient's son lives with her.  The patient recently lost an aunt in December 2020 and feels she got C19 related to the amount of family and friends coming in and out to pay respects to her aunt.    Social Determinants of Health   Financial Resource Strain: Low Risk   . Difficulty of Paying Living Expenses: Not hard at all  Food Insecurity: No Food Insecurity  . Worried About Charity fundraiser in the Last Year: Never true  . Ran Out of Food in the Last Year: Never true  Transportation Needs: No Transportation Needs  . Lack of Transportation (Medical): No  . Lack of Transportation (Non-Medical): No  Physical Activity: Not on file  Stress: No Stress Concern Present  . Feeling of Stress : Not at all  Social Connections: Not on file  Intimate Partner Violence: Not on file    Vital Signs: There were no vitals taken for this visit.  Examination: General Appearance: The patient is well-developed, well-nourished, and in no distress. Neck Circumference: 45 Skin: Gross inspection of skin unremarkable. Head: normocephalic, no gross deformities. Eyes: no gross deformities noted. ENT:  ears appear grossly normal Neurologic: Alert and oriented. No involuntary movements.    EPWORTH SLEEPINESS SCALE:  Scale:  (0)= no chance of dozing; (1)= slight chance of dozing; (2)= moderate chance of dozing; (3)= high chance of dozing  Chance  Situtation    Sitting and reading: 1    Watching TV: 1    Sitting Inactive in public: 0    As a passenger in car: 1      Lying down to rest: 1    Sitting and talking: 0    Sitting quielty after lunch: 1    In a car, stopped in traffic: 0   TOTAL SCORE:   5 out of 24    SLEEP STUDIES:  1. 05/10/13 Split - AHI 24.1, Low SpO2 82%,  supplemental oxygen @ 1.5 LPM dues to baseline inn low 80s/  AutoBiPAP @ 25/12cmh2o, PS 5  suppliemental oxygen @ 2 lpm baselline sats still tended to run nder 90% and declined to mid 80s during REM at final pressure.   CPAP COMPLIANCE DATA:  Date Range: 03/24/19 - 03/23/19  Average Daily Use: 5:57 hours  Median Use: 7:01  Compliance for > 4 Hours: 83%  AHI: 1.1 respiratory events per hour  Days Used: 312/365  Mask Leak: 37.4  95th Percentile Pressure: 18.4/13.4 cm         LABS: Recent Results (from the past 2160 hour(s))  SARS CORONAVIRUS 2 (TAT 6-24 HRS) Nasopharyngeal Nasopharyngeal Swab     Status: None   Collection Time: 01/07/20  8:45 AM   Specimen: Nasopharyngeal Swab  Result Value Ref Range   SARS Coronavirus 2 NEGATIVE NEGATIVE    Comment: (NOTE) SARS-CoV-2 target nucleic acids are NOT DETECTED.  The SARS-CoV-2 RNA is generally detectable in upper and lower respiratory specimens during the acute phase of infection. Negative results do not preclude SARS-CoV-2 infection, do not rule out co-infections with other pathogens, and should not be used as the sole basis for treatment or other patient management decisions. Negative results must be combined with clinical observations, patient history, and epidemiological information. The expected result is Negative.  Fact  Sheet for Patients: SugarRoll.be  Fact Sheet for Healthcare Providers: https://www.woods-mathews.com/  This test is not yet approved or cleared by the [place] FDA and  has been authorized for detection and/or diagnosis of SARS-CoV-2 by FDA under an Emergency Use Authorization (EUA). This EUA will remain  in effect (meaning this test can be used) for the duration of the COVID-19 declaration under Se ction 564(b)(1) of the Act, 21 U.S.C. section 360bbb-3(b)(1), unless the authorization is terminated or revoked sooner.  Performed at [facility], [place] [address], [place], [place] [address]   CBC with Differential     Status: Abnormal   Collection Time: 02/04/20 11:51 PM  Result Value Ref Range   WBC 13.5 (H) 4.0 - 10.5 K/uL   RBC 4.80 3.87 - 5.11 MIL/uL   Hemoglobin 12.3 12.0 - 15.0 g/dL   HCT 41.8 36.0 - 46.0 %   MCV 87.1 80.0 - 100.0 fL   MCH 25.6 (L) 26.0 - 34.0 pg   MCHC 29.4 (L) 30.0 - 36.0 g/dL   RDW 15.3 11.5 - 15.5 %   Platelets 344 150 - 400 K/uL   nRBC 0.0 0.0 - 0.2 %   Neutrophils Relative % 71 %   Neutro Abs 9.8 (H) 1.7 - 7.7 K/uL   Lymphocytes Relative 16 %   Lymphs Abs 2.2 0.7 - 4.0 K/uL   Monocytes Relative 9 %   Monocytes Absolute 1.2 (H) 0.1 - 1.0 K/uL   Eosinophils Relative 2 %   Eosinophils Absolute 0.2 0.0 - 0.5 K/uL   Basophils Relative 1 %   Basophils Absolute 0.1 0.0 - 0.1 K/uL   Immature Granulocytes 1 %   Abs Immature Granulocytes 0.08 (H) 0.00 - 0.07 K/uL    Comment: Performed at [facility], [address], [place], [place] [address]  Basic metabolic panel     Status: Abnormal   Collection Time: 02/04/20 11:51 PM  Result Value Ref Range   Sodium 140 135 - 145 mmol/L   Potassium 3.4 (L) 3.5 - 5.1 mmol/L   Chloride 100 98 - 111 mmol/L   CO2 29 22 - 32 mmol/L   Glucose, Bld 119 (H) 70 - 99 mg/dL    Comment: Glucose reference range applies only  to samples taken after fasting for at  least 8 hours.   BUN 18 6 - 20 mg/dL   Creatinine, Ser 0.91 0.44 - 1.00 mg/dL   Calcium 8.3 (L) 8.9 - 10.3 mg/dL   GFR, Estimated >60 >60 mL/min    Comment: (NOTE) Calculated using the CKD-EPI Creatinine Equation (2021)    Anion gap 11 5 - 15    Comment: Performed at [facility], [address], [place], [place] [address]  Troponin I (High Sensitivity)     Status: None   Collection Time: 02/04/20 11:51 PM  Result Value Ref Range   Troponin I (High Sensitivity) 11 <18 ng/L    Comment: (NOTE) Elevated high sensitivity troponin I (hsTnI) values and significant  changes across serial measurements may suggest ACS but many other  chronic and acute conditions are known to elevate hsTnI results.  Refer to the "Links" section for chest pain algorithms and additional  guidance. Performed at [facility], [place], [place] [address]   POC SARS Coronavirus 2 Ag-ED - Nasal Swab (BD Veritor Kit)     Status: None   Collection Time: 02/05/20  1:56 AM  Result Value Ref Range   SARS Coronavirus 2 Ag NEGATIVE NEGATIVE    Comment: (NOTE) SARS-CoV-2 antigen NOT DETECTED.   Negative results are presumptive.  Negative results do not preclude SARS-CoV-2 infection and should not be used as the sole basis for treatment or other patient management decisions, including infection  control decisions, particularly in the presence of clinical signs and  symptoms consistent with COVID-19, or in those who have been in contact with the virus.  Negative results must be combined with clinical observations, patient history, and epidemiological information. The expected result is Negative.  Fact Sheet for Patients: PodPark.tn  Fact Sheet for Healthcare Providers: GiftContent.is   This test is not yet approved or cleared by the [place] FDA and  has been authorized for detection and/or diagnosis of SARS-CoV-2  by FDA under an Emergency Use Authorization (EUA).  This EUA will remain in effect (meaning this test can be used) for the duration of  the C OVID-19 declaration under Section 564(b)(1) of the Act, 21 U.S.C. section 360bbb-3(b)(1), unless the authorization is terminated or revoked sooner.    Bayer DCA Hb A1c Waived     Status: Abnormal   Collection Time: 03/08/20  9:27 AM  Result Value Ref Range   HB A1C (BAYER DCA - WAIVED) 7.0 (H) <7.0 %    Comment:                                       Diabetic Adult            <7.0                                       Healthy Adult        4.3 - 5.7                                                           (DCCT/NGSP) American Diabetes Association's Summary of Glycemic  Recommendations for Adults with Diabetes: Hemoglobin A1c <7.0%. More stringent glycemic goals (A1c <6.0%) may further reduce complications at the cost of increased risk of hypoglycemia.   Microalbumin, Urine Waived     Status: Abnormal   Collection Time: 03/08/20  9:27 AM  Result Value Ref Range   Microalb, Ur Waived 80 (H) 0 - 19 mg/L   Creatinine, Urine Waived 50 10 - 300 mg/dL   Microalb/Creat Ratio 30-300 (H) <30 mg/g    Comment:                              Abnormal:       30 - 300                         High Abnormal:           >003   Basic metabolic panel     Status: Abnormal   Collection Time: 03/08/20  9:29 AM  Result Value Ref Range   Glucose 140 (H) 65 - 99 mg/dL   BUN 22 6 - 24 mg/dL   Creatinine, Ser 1.19 (H) 0.57 - 1.00 mg/dL   GFR calc non Af Amer 53 (L) >59 mL/min/1.73   GFR calc Af Amer 61 >59 mL/min/1.73    Comment: **In accordance with recommendations from the NKF-ASN Task force,**   [facility] is in the process of updating its eGFR calculation to the   2021 CKD-EPI creatinine equation that estimates kidney function   without a race variable.    BUN/Creatinine Ratio 18 9 - 23   Sodium 141 134 - 144 mmol/L   Potassium 4.2 3.5 - 5.2 mmol/L   Chloride 102  96 - 106 mmol/L   CO2 24 20 - 29 mmol/L   Calcium 7.9 (L) 8.7 - 10.2 mg/dL  Lipid Panel w/o Chol/HDL Ratio     Status: None   Collection Time: 03/08/20  9:29 AM  Result Value Ref Range   Cholesterol, Total 139 100 - 199 mg/dL   Triglycerides 68 0 - 149 mg/dL   HDL 47 >39 mg/dL   VLDL Cholesterol Cal 14 5 - 40 mg/dL   LDL Chol Calc (NIH) 78 0 - 99 mg/dL  TSH     Status: None   Collection Time: 03/08/20  9:29 AM  Result Value Ref Range   TSH 2.000 0.450 - 4.500 uIU/mL    Radiology: DG Chest 2 View  Result Date: 03/16/2020 CLINICAL DATA:  Follow-up pneumonia EXAM: CHEST - 2 VIEW COMPARISON:  02/04/2020 FINDINGS: Cardiac shadow remains enlarged. Lungs are well aerated bilaterally. Previously seen infiltrate has resolved in the interval. No new focal infiltrate is seen. No effusion is noted. No bony abnormality is noted. IMPRESSION: Resolution of previously seen multifocal infiltrates. Electronically Signed   By: Inez Catalina M.D.   On: 03/16/2020 15:07    No results found.  DG Chest 2 View  Result Date: 03/16/2020 CLINICAL DATA:  Follow-up pneumonia EXAM: CHEST - 2 VIEW COMPARISON:  02/04/2020 FINDINGS: Cardiac shadow remains enlarged. Lungs are well aerated bilaterally. Previously seen infiltrate has resolved in the interval. No new focal infiltrate is seen. No effusion is noted. No bony abnormality is noted. IMPRESSION: Resolution of previously seen multifocal infiltrates. Electronically Signed   By: Inez Catalina M.D.   On: 03/16/2020 15:07      Assessment and Plan: Patient Active Problem List   Diagnosis Date Noted  .  Pneumonia 02/09/2020  . History of 2019 novel coronavirus disease (COVID-19) 06/15/2019  . Proteinuria due to type 2 diabetes mellitus ([place]) 06/15/2019  . Chronic heart failure with preserved ejection fraction ([place]) 10/22/2018  . NICM (nonischemic cardiomyopathy) ([place]) 10/22/2018  . BMI 50.0-59.9, adult ([place]) 11/18/2017  . Elevated TSH 12/09/2016  . Type 2  diabetes mellitus treated with insulin ([place]) 04/25/2015  . Hyperlipidemia associated with type 2 diabetes mellitus ([place]) 11/10/2014  . Obstructive sleep apnea 11/10/2014  . Morbid obesity ([place]) 11/10/2014  . Vitamin D deficiency 11/10/2014  . Benign hypertensive heart and kidney disease with CHF and stage 2 chronic kidney disease ([place]) 11/10/2014   1. OSA on CPAP The patient does tolerate PAP and reports significant benefit from PAP use. The patient was reminded how to clean the CPAP and advised to get a different size mask to help with mask leak. The patient was also counselled on continuing her efforts at weight loss. She is downl 21 lbs since last visit.  The compliance has been very good and the apnea is controlled. OSA- continue excellent compliance   2. CPAP use counseling CPAP Counseling: had a lengthy discussion with the patient regarding the importance of PAP therapy in management of the sleep apnea. Patient appears to understand the risk factor reduction and also understands the risks associated with untreated sleep apnea. Patient will try to make a good faith effort to remain compliant with therapy. Also instructed the patient on proper cleaning of the device including the water must be changed daily if possible and use of distilled water is preferred. Patient understands that the machine should be regularly cleaned with appropriate recommended cleaning solutions that do not damage the PAP machine for example given white vinegar and water rinses. Other methods such as ozone treatment may not be as good as these simple methods to achieve cleaning.  3. Morbid obesity ([place]) Has lost weight over the last year. Encouraged to continue with weight loss efforts. Obesity Counseling: Had a lengthy discussion regarding patients BMI and weight issues. Patient was instructed on portion control as well as increased activity. Also discussed caloric restrictions with trying to maintain intake less than 2000  Kcal. Discussions were made in accordance with the 5As of weight management. Simple actions such as not eating late and if able to, taking a walk is suggested.  4. BMI 50.0-59.9, adult ([place]) As noted above.   5. Type 2 diabetes mellitus treated with insulin ([place]) per pt well controlled, continue monitoring blood sugars and dietary modifications.   6. NICM (nonischemic cardiomyopathy) ([place]) Well compensated at this time. Stable.   7. Chronic heart failure with preserved ejection fraction ([place]) Well compensated. Not needing oxygen most of the time any longer.    TGeneral Counseling: I have discussed the findings of the evaluation and examination with Sara Zhang.  I have also discussed any further diagnostic evaluation thatmay be needed or ordered today. Sara Zhang verbalizes understanding of the findings of todays visit. We also reviewed her medications today and discussed drug interactions and side effects including but not limited excessive drowsiness and altered mental states. We also discussed that there is always a risk not just to her but also people around her. she has been encouraged to call the office with any questions or concerns that should arise related to todays visit.  No orders of the defined types were placed in this encounter.       I have personally obtained a history, examined the patient, evaluated laboratory  and imaging results, formulated the assessment and plan and placed orders.  This patient was seen today by Tressie Ellis, PA-C in collaboration with Dr. Devona Konig.   Richelle Ito Saunders Glance, PhD, FAASM  Diplomate, American Board of Sleep Medicine    Allyne Gee, MD [facility] Diplomate ABMS Pulmonary and Critical Care Medicine Sleep medicine

## 2020-04-03 ENCOUNTER — Ambulatory Visit: Payer: Medicare Other | Admitting: Podiatry

## 2020-04-03 ENCOUNTER — Other Ambulatory Visit: Payer: Self-pay

## 2020-04-03 ENCOUNTER — Ambulatory Visit (INDEPENDENT_AMBULATORY_CARE_PROVIDER_SITE_OTHER): Payer: Medicare Other | Admitting: Podiatry

## 2020-04-03 ENCOUNTER — Encounter: Payer: Self-pay | Admitting: Podiatry

## 2020-04-03 DIAGNOSIS — S90412A Abrasion, left great toe, initial encounter: Secondary | ICD-10-CM | POA: Diagnosis not present

## 2020-04-03 DIAGNOSIS — L03032 Cellulitis of left toe: Secondary | ICD-10-CM

## 2020-04-03 LAB — EXTERNAL GENERIC LAB PROCEDURE: COLOGUARD: NEGATIVE

## 2020-04-03 LAB — COLOGUARD: COLOGUARD: NEGATIVE

## 2020-04-03 MED ORDER — AMOXICILLIN-POT CLAVULANATE 875-125 MG PO TABS: 1.0000 | ORAL_TABLET | Freq: Two times a day (BID) | ORAL | 0 refills | Status: DC

## 2020-04-03 NOTE — Progress Notes (Signed)
She presents today for follow-up of her matrixectomy hallux left tibial border.  States that is still sore and she is noted she is got these scratches on her foot which were not there before she started soaking.  She states that she is got darkness across the dorsal aspect of her foot around her toes as she points to the areas and then states that she is got these 3 scratches as she woke up with them.  Objective: Vital signs are stable she is alert oriented x3 she has 3 linear scratches which look like cat scratches over the dorsal lateral aspect of the left foot just anterior to the fibular malleolus.  She does have some surrounding erythema but there is nothing appears to be cellulitic or extending.  She also has some postinflammatory hyperpigmentation around the lesser digits does not appear to be ecchymotic.  She has some maceration around the hallux where the matrixectomy was performed consistent with leaving the Band-Aid on too long.  There is mild erythema surrounding the dorsal of the foot but I think this appears to be more of a contact dermatitis associated most likely with the Betadine.  Assessment: Atopic dermatitis with Betadine.  Mild paronychia hallux left.  Mild abrasions dorsal lateral aspect left foot.  Plan: At this point I would recommend she discontinue use of the Betadine and start with Epson salt warm water soaks cover during the day leave open at bedtime.  Also recommended Augmentin 875 mg take 1 tablet by mouth twice a day and will follow up with me in 2 weeks.  She will call with questions or concerns or worsening condition.

## 2020-04-17 ENCOUNTER — Other Ambulatory Visit: Payer: Self-pay

## 2020-04-17 ENCOUNTER — Ambulatory Visit (INDEPENDENT_AMBULATORY_CARE_PROVIDER_SITE_OTHER): Payer: Medicare Other | Admitting: Podiatry

## 2020-04-17 ENCOUNTER — Encounter: Payer: Self-pay | Admitting: Podiatry

## 2020-04-17 DIAGNOSIS — L600 Ingrowing nail: Secondary | ICD-10-CM

## 2020-04-17 DIAGNOSIS — Z9889 Other specified postprocedural states: Secondary | ICD-10-CM

## 2020-04-17 NOTE — Progress Notes (Signed)
She presents today for follow-up of her paronychia status post matrixectomy.  She states this feels so much better since I took the antibiotics I have no problems whatsoever at this point.  Continues to soak every day.  Objective: Vital signs are stable alert oriented x3 there is no erythema edema cellulitis drainage or odor postinflammatory hyperpigmentation is noted around the proximal medial nail fold but no purulence no malodor nothing is expressible.  Assessment: Well-healing surgical toe hallux left.  Plan: Encouraged her to continue to soak until is absolutely no pain no drainage and no tenderness on palpation.

## 2020-05-02 ENCOUNTER — Other Ambulatory Visit: Payer: Self-pay | Admitting: Internal Medicine

## 2020-05-02 NOTE — Telephone Encounter (Signed)
Rx request sent to pharmacy.  

## 2020-05-30 DIAGNOSIS — H40003 Preglaucoma, unspecified, bilateral: Secondary | ICD-10-CM | POA: Diagnosis not present

## 2020-05-30 LAB — HM DIABETES EYE EXAM

## 2020-06-02 ENCOUNTER — Other Ambulatory Visit: Payer: Self-pay

## 2020-06-02 ENCOUNTER — Other Ambulatory Visit: Payer: Self-pay | Admitting: Nurse Practitioner

## 2020-06-02 DIAGNOSIS — Z20822 Contact with and (suspected) exposure to covid-19: Secondary | ICD-10-CM | POA: Diagnosis not present

## 2020-06-02 MED ORDER — FUROSEMIDE 40 MG PO TABS
40.0000 mg | ORAL_TABLET | Freq: Two times a day (BID) | ORAL | 3 refills | Status: DC
Start: 2020-06-02 — End: 2021-05-28

## 2020-06-05 ENCOUNTER — Ambulatory Visit
Admission: RE | Admit: 2020-06-05 | Discharge: 2020-06-05 | Disposition: A | Discharge status: Home / Self Care | Payer: Medicare Other | Source: Ambulatory Visit | Attending: Nurse Practitioner | Admitting: Nurse Practitioner

## 2020-06-05 ENCOUNTER — Encounter: Payer: Self-pay | Admitting: Nurse Practitioner

## 2020-06-05 ENCOUNTER — Other Ambulatory Visit: Payer: Self-pay

## 2020-06-05 ENCOUNTER — Ambulatory Visit
Admission: RE | Admit: 2020-06-05 | Discharge: 2020-06-05 | Disposition: A | Discharge status: Home / Self Care | Payer: Medicare Other | Attending: Nurse Practitioner | Admitting: Nurse Practitioner

## 2020-06-05 ENCOUNTER — Other Ambulatory Visit: Payer: Self-pay | Admitting: Nurse Practitioner

## 2020-06-05 ENCOUNTER — Ambulatory Visit (INDEPENDENT_AMBULATORY_CARE_PROVIDER_SITE_OTHER): Payer: Medicare Other | Admitting: Nurse Practitioner

## 2020-06-05 VITALS — BP 122/75 | HR 85 | Temp 98.7°F | Wt 347.6 lb

## 2020-06-05 DIAGNOSIS — R059 Cough, unspecified: Secondary | ICD-10-CM | POA: Insufficient documentation

## 2020-06-05 DIAGNOSIS — E1169 Type 2 diabetes mellitus with other specified complication: Secondary | ICD-10-CM

## 2020-06-05 DIAGNOSIS — R809 Proteinuria, unspecified: Secondary | ICD-10-CM

## 2020-06-05 DIAGNOSIS — I428 Other cardiomyopathies: Secondary | ICD-10-CM

## 2020-06-05 DIAGNOSIS — G4733 Obstructive sleep apnea (adult) (pediatric): Secondary | ICD-10-CM | POA: Diagnosis not present

## 2020-06-05 DIAGNOSIS — Z1159 Encounter for screening for other viral diseases: Secondary | ICD-10-CM | POA: Diagnosis not present

## 2020-06-05 DIAGNOSIS — I130 Hypertensive heart and chronic kidney disease with heart failure and stage 1 through stage 4 chronic kidney disease, or unspecified chronic kidney disease: Secondary | ICD-10-CM | POA: Diagnosis not present

## 2020-06-05 DIAGNOSIS — N182 Chronic kidney disease, stage 2 (mild): Secondary | ICD-10-CM | POA: Diagnosis not present

## 2020-06-05 DIAGNOSIS — Z6841 Body Mass Index (BMI) 40.0 and over, adult: Secondary | ICD-10-CM | POA: Diagnosis not present

## 2020-06-05 DIAGNOSIS — Z794 Long term (current) use of insulin: Secondary | ICD-10-CM | POA: Diagnosis not present

## 2020-06-05 DIAGNOSIS — E785 Hyperlipidemia, unspecified: Secondary | ICD-10-CM

## 2020-06-05 DIAGNOSIS — I5032 Chronic diastolic (congestive) heart failure: Secondary | ICD-10-CM

## 2020-06-05 DIAGNOSIS — R051 Acute cough: Secondary | ICD-10-CM | POA: Insufficient documentation

## 2020-06-05 DIAGNOSIS — E119 Type 2 diabetes mellitus without complications: Secondary | ICD-10-CM | POA: Diagnosis not present

## 2020-06-05 DIAGNOSIS — Z9989 Dependence on other enabling machines and devices: Secondary | ICD-10-CM

## 2020-06-05 DIAGNOSIS — E1129 Type 2 diabetes mellitus with other diabetic kidney complication: Secondary | ICD-10-CM

## 2020-06-05 DIAGNOSIS — R0602 Shortness of breath: Secondary | ICD-10-CM | POA: Diagnosis not present

## 2020-06-05 LAB — BAYER DCA HB A1C WAIVED: HB A1C (BAYER DCA - WAIVED): 6.6 % (ref <7.0)

## 2020-06-05 MED ORDER — ATORVASTATIN CALCIUM 10 MG PO TABS: ORAL_TABLET | ORAL | 4 refills | Status: DC

## 2020-06-05 MED ORDER — AMOXICILLIN-POT CLAVULANATE 875-125 MG PO TABS: 1.0000 | ORAL_TABLET | Freq: Two times a day (BID) | ORAL | 0 refills | Status: AC

## 2020-06-05 MED ORDER — ONETOUCH ULTRASOFT LANCETS MISC: 12 refills | Status: AC

## 2020-06-05 MED ORDER — ALBUTEROL SULFATE HFA 108 (90 BASE) MCG/ACT IN AERS
2.0000 | INHALATION_SPRAY | Freq: Four times a day (QID) | RESPIRATORY_TRACT | 0 refills | Status: DC | PRN
Start: 2020-06-05 — End: 2020-06-25

## 2020-06-05 MED ORDER — FLUTICASONE PROPIONATE 50 MCG/ACT NA SUSP: 2.0000 | Freq: Every day | NASAL | 6 refills | Status: DC

## 2020-06-05 MED ORDER — ATENOLOL 50 MG PO TABS: 50.0000 mg | ORAL_TABLET | Freq: Every day | ORAL | 4 refills | Status: DC

## 2020-06-05 MED ORDER — HYDROCOD POLST-CPM POLST ER 10-8 MG/5ML PO SUER: 5.0000 mL | Freq: Two times a day (BID) | ORAL | 0 refills | Status: DC | PRN

## 2020-06-05 NOTE — Assessment & Plan Note (Signed)
Refer to morbid obesity plan of care. 

## 2020-06-05 NOTE — Assessment & Plan Note (Signed)
Chronic, ongoing with recent LDL 78 and TCHOL 139 at recent visit.  Continue current medication regimen as is at goal.  Lipid panel next visit.  Refills sent in.

## 2020-06-05 NOTE — Assessment & Plan Note (Signed)
BMI 51.33 with HTN, HF, T2DM.  Recommended eating smaller high protein, low fat meals more frequently and exercising 30 mins a day 5 times a week with a goal of 10-15lb weight loss in the next 3 months. Patient voiced their understanding and motivation to adhere to these recommendations.

## 2020-06-05 NOTE — Assessment & Plan Note (Signed)
Chronic, ongoing with BP at goal.  Continue current medication regimen and collaboration with cardiology.  Praised for cutting back on pork skins and recommend to continue this cut back.  Recommend: - Reminded to call for an overnight weight gain of >2 pounds or a weekly weight weight of >5 pounds - not adding salt to his food and has been reading food labels. Reviewed the importance of keeping daily sodium intake to 2000mg  daily -- NO PORK SKINS

## 2020-06-05 NOTE — Telephone Encounter (Signed)
Already refilled by other.

## 2020-06-05 NOTE — Progress Notes (Signed)
BP 122/75   Pulse 85   Temp 98.7 F (37.1 C) (Oral)   Wt (!) 347 lb 9.6 oz (157.7 kg)   SpO2 94%   BMI 51.33 kg/m    Subjective:    Patient ID: Sara Zhang, female    DOB: 22-Nov-1970, 50 y.o.   MRN: [id number]  HPI: Sara Zhang is a 50 y.o. female  Chief Complaint  Patient presents with  . Hyperlipidemia  . Hypertension  . Diabetes  . Cough    Patient states she would like to discuss with provider mild cough, but she is having some congestion in her throat, but denies sore throat. Patient states her symptoms started Saturday.    DIABETES Current medications include Jardiance 25 MG, Toujeo 54 units QHS, and Bydureon 2 MG weekly.A1C in February was 7% and urine ALB 80. She is followed by endocrinology and her last visit with them was 10/27/19, no changes made on review of note.  Her A1c today is 6.6% -- she sees endocrinology next week.   Polydipsia/polyuria: no Visual disturbance: no Chest pain: no Paresthesias: no Glucose Monitoring: yes Accucheck frequency: not checking Fasting glucose:  Evening:  Before meals: Taking Insulin?: yes Long acting insulin: 52 units Short acting insulin: Blood Pressure Monitoring: not checking Retinal Examination: Up to Date Foot Exam: Up to Date Pneumovax: Up to Date Influenza: Not Up to Date Aspirin: no  HYPERTENSION / HYPERLIPIDEMIA/HF Current medications include Lisinopril, Furosemide, Amlodipine, Atenolol for HTN + Lipitor for HLD. Followed by cardiology and last saw Dr. End10/4/21, no changes made.Is working on diet and has cut way back on pork skins.  Last echo in 2020 October was 55-60% Satisfied with current treatment? yes Duration of hypertension: chronic BP monitoring frequency: not checking BP range:  BP medication side effects: no Duration of hyperlipidemia: chronic Cholesterol medication side effects: no Cholesterol supplements:  none Medication compliance: good compliance Aspirin: no Recent stressors: no Recurrent headaches:no Visual changes: no Palpitations: no Dyspnea: occasional -- wears her O2 at 2L at night Chest pain: no Lower extremity edema: occasional -- when eats salty foods Dizzy/lightheaded: no  COUGH Started on Saturday with a mild cough, mainly up in throat.  Has had some congestion and rhinorrhea.  No sore throat, but throat feels tight.  On Saturday she reports getting real cold, but no fever noted.  Feels she may be breathing a little harder with congestion.  Does not smoke, never a smoker.  She did Covid test this morning at home and this was negative.    History of pneumonia 02/05/20 and history of Covid 19 infection 02/02/2019 with hospitalization. Duration: days Circumstances of initial development of cough: unknown Cough severity: moderate Cough description: productive -- cloudy and green Aggravating factors:  worse at night Alleviating factors: nothing Status:  fluctuating Treatments attempted: none Wheezing: yes Shortness of breath: a little more then baseline Chest pain: no Chest tightness:yes Nasal congestion: yes Runny nose: yes Postnasal drip: yes Frequent throat clearing or swallowing: yes Hemoptysis: no Fevers: no Night sweats: no Weight loss: no Heartburn: no Recent foreign travel: no Tuberculosis contacts: no  Relevant past medical, surgical, family and social history reviewed and updated as indicated. Interim medical history since our last visit reviewed. Allergies and medications reviewed and updated.  Review of Systems  Constitutional: Negative for activity change, appetite change, diaphoresis, fatigue and fever.  Respiratory: Positive for cough, shortness of breath (at baseline, no worse) and wheezing. Negative for chest tightness.   Cardiovascular: Positive  for leg swelling (at baseline, no worse). Negative for chest pain and palpitations.  Gastrointestinal:  Negative.   Endocrine: Negative for polydipsia, polyphagia and polyuria.  Neurological: Negative.   Psychiatric/Behavioral: Negative.     Per HPI unless specifically indicated above     Objective:    BP 122/75   Pulse 85   Temp 98.7 F (37.1 C) (Oral)   Wt (!) 347 lb 9.6 oz (157.7 kg)   SpO2 94%   BMI 51.33 kg/m   Wt Readings from Last 3 Encounters:  06/05/20 (!) 347 lb 9.6 oz (157.7 kg)  03/27/20 (!) 345 lb (156.5 kg)  03/08/20 (!) 352 lb (159.7 kg)    Physical Exam Vitals and nursing note reviewed.  Constitutional:      General: She is awake. She is not in acute distress.    Appearance: She is well-developed. She is morbidly obese. She is not ill-appearing.  HENT:     Head: Normocephalic.     Right Ear: Hearing normal.     Left Ear: Hearing normal.  Eyes:     General: Lids are normal.        Right eye: No discharge.        Left eye: No discharge.     Conjunctiva/sclera: Conjunctivae normal.     Pupils: Pupils are equal, round, and reactive to light.  Neck:     Thyroid: No thyromegaly.     Vascular: No carotid bruit.  Cardiovascular:     Rate and Rhythm: Normal rate and regular rhythm.     Heart sounds: Normal heart sounds. No murmur heard. No gallop.   Pulmonary:     Effort: Pulmonary effort is normal. No accessory muscle usage or respiratory distress.     Breath sounds: Normal breath sounds.     Comments: Lungs clear throughout. Abdominal:     General: Bowel sounds are normal.     Palpations: Abdomen is soft.  Musculoskeletal:     Cervical back: Normal range of motion and neck supple.     Right lower leg: No edema.     Left lower leg: No edema.  Skin:    General: Skin is warm and dry.  Neurological:     Mental Status: She is alert and oriented to person, place, and time.  Psychiatric:        Attention and Perception: Attention normal.        Mood and Affect: Mood normal.        Speech: Speech normal.        Behavior: Behavior normal. Behavior is  cooperative.        Thought Content: Thought content normal.    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  Continue current medication regimen and collaboration with cardiology.  Recommend checking BP three mornings a week at home + documenting for providers.  CMP today.  Recommend DASH diet focus and avoidance of high sodium foods, praised for cutting back on pork skins.  Return in 3 months.      Relevant Medications   atenolol (TENORMIN) 50 MG tablet   atorvastatin (LIPITOR) 10  MG tablet   Other Relevant Orders   Comprehensive metabolic panel   CBC with Differential/Platelet   Chronic heart failure with preserved ejection fraction (HCC)    Chronic, ongoing with BP at goal.  Continue current medication regimen and collaboration with cardiology.  Praised for cutting back on pork skins and recommend to continue this cut back.  Recommend: - Reminded to call for an overnight weight gain of >2 pounds or a weekly weight weight of >5 pounds - not adding salt to his food and has been reading food labels. Reviewed the importance of keeping daily sodium intake to 2000mg  daily -- NO PORK SKINS      Relevant Medications   atenolol (TENORMIN) 50 MG tablet   atorvastatin (LIPITOR) 10 MG tablet   Other Relevant Orders   Comprehensive metabolic panel   CBC with Differential/Platelet   NICM (nonischemic cardiomyopathy) (HCC)    Chronic, ongoing.  Continue collaboration with cardiology and current medication regimen as prescribed by them.      Relevant Medications   atenolol (TENORMIN) 50 MG tablet   atorvastatin (LIPITOR) 10 MG tablet     Respiratory   OSA on CPAP     Chronic, ongoing.  Continue current use of O2 at night.        Endocrine   Hyperlipidemia associated with type 2 diabetes mellitus (HCC)    Chronic, ongoing with recent LDL 78 and TCHOL 139 at recent visit.  Continue current medication regimen as is at goal.  Lipid panel next visit.  Refills sent in.      Relevant Medications   atorvastatin (LIPITOR) 10 MG tablet   Other Relevant Orders   Bayer DCA Hb A1c Waived   Lipid Panel w/o Chol/HDL Ratio   Type 2 diabetes mellitus treated with insulin (HCC) - Primary    Chronic, ongoing.  Followed by endo.  A1C 6.6% today, praised for ongoing success.  Continue collaboration with endocrinology.  Continue current medication regimen and adjust as needed.  Recommend she continue to monitor BS at home 2-3 times a day.  Refills as needed.  Return in 3 months.       Relevant Medications   atorvastatin (LIPITOR) 10 MG tablet   Other Relevant Orders   Bayer DCA Hb A1c Waived   Comprehensive metabolic panel   Proteinuria due to type 2 diabetes mellitus (HCC)    Chronic, ongoing.  Followed by endo.  A1C 6.6% today and urine ALB 80 last visit, praised for ongoing success with A1c.  Continue collaboration with endocrinology.  Continue current medication regimen and adjust as needed, Lisinopril offering kidney protection.  Recommend she continue to monitor BS at home 2-3 times a day.  Refills as needed.  Return in 3 months.       Relevant Medications   atorvastatin (LIPITOR) 10 MG tablet     Other   Morbid obesity (HCC)    BMI 51.33 with HTN, HF, T2DM.  Recommended eating smaller high protein, low fat meals more frequently and exercising 30 mins a day 5 times a week with a goal of 10-15lb weight loss in the next 3 months. Patient voiced their understanding and motivation to adhere to these recommendations.       BMI 50.0-59.9, adult [facility])    Refer to morbid obesity plan of care.      Cough    High risk for PNA or HF exacerbation -- negative  Covid test at home this morning.  Will obtain CXR today, she  is aware to obtain after visit. Due to high risk factors and history of PNA will start Augmentin x 7 days, script sent.  Will also start Flonase daily, as suspect some underlying allergic rhinitis + recommend she take daily Claritin OTC.  Tussionex as needed at night for cough, during daytime recommend Diabetic Tussin.  Albuterol inhaler sent to use as needed.  Recommend plenty of rest and fluid.  Return in one week for follow-up, sooner if worsening symptoms.      Relevant Orders   DG Chest 2 View    Other Visit Diagnoses    Need for hepatitis C screening test       Hep C screening due, obtain on labs today   Relevant Orders   Hepatitis C antibody       Follow up plan: Return in about 1 week (around 06/12/2020) for Cough.

## 2020-06-05 NOTE — Patient Instructions (Addendum)
Start Flonase daily.  Take Claritin daily.  Take Diabetic Tussin as needed.   Diabetes Mellitus and Nutrition, Adult When you have diabetes, or diabetes mellitus, it is very important to have healthy eating habits because your blood sugar (glucose) levels are greatly affected by what you eat and drink. Eating healthy foods in the right amounts, at about the same times every day, can help you:  Control your blood glucose.  Lower your risk of heart disease.  Improve your blood pressure.  Reach or maintain a healthy weight. What can affect my meal plan? Every person with diabetes is different, and each person has different needs for a meal plan. Your health care provider may recommend that you work with a dietitian to make a meal plan that is best for you. Your meal plan may vary depending on factors such as:  The calories you need.  The medicines you take.  Your weight.  Your blood glucose, blood pressure, and cholesterol levels.  Your activity level.  Other health conditions you have, such as heart or kidney disease. How do carbohydrates affect me? Carbohydrates, also called carbs, affect your blood glucose level more than any other type of food. Eating carbs naturally raises the amount of glucose in your blood. Carb counting is a method for keeping track of how many carbs you eat. Counting carbs is important to keep your blood glucose at a healthy level, especially if you use insulin or take certain oral diabetes medicines. It is important to know how many carbs you can safely have in each meal. This is different for every person. Your dietitian can help you calculate how many carbs you should have at each meal and for each snack. How does alcohol affect me? Alcohol can cause a sudden decrease in blood glucose (hypoglycemia), especially if you use insulin or take certain oral diabetes medicines. Hypoglycemia can be a life-threatening condition. Symptoms of hypoglycemia, such as  sleepiness, dizziness, and confusion, are similar to symptoms of having too much alcohol.  Do not drink alcohol if: ? Your health care provider tells you not to drink. ? You are pregnant, may be pregnant, or are planning to become pregnant.  If you drink alcohol: ? Do not drink on an empty stomach. ? Limit how much you use to:  0-1 drink a day for women.  0-2 drinks a day for men. ? Be aware of how much alcohol is in your drink. In the [place] one drink equals one 12 oz bottle of beer (355 mL), one 5 oz glass of wine (148 mL), or one 1 oz glass of hard liquor (44 mL). ? Keep yourself hydrated with water, diet soda, or unsweetened iced tea.  Keep in mind that regular soda, juice, and other mixers may contain a lot of sugar and must be counted as carbs. What are tips for following this plan? Reading food labels  Start by checking the serving size on the "Nutrition Facts" label of packaged foods and drinks. The amount of calories, carbs, fats, and other nutrients listed on the label is based on one serving of the item. Many items contain more than one serving per package.  Check the total grams (g) of carbs in one serving. You can calculate the number of servings of carbs in one serving by dividing the total carbs by 15. For example, if a food has 30 g of total carbs per serving, it would be equal to 2 servings of carbs.  Check the number of  grams (g) of saturated fats and trans fats in one serving. Choose foods that have a low amount or none of these fats.  Check the number of milligrams (mg) of salt (sodium) in one serving. Most people should limit total sodium intake to less than 2,300 mg per day.  Always check the nutrition information of foods labeled as "low-fat" or "nonfat." These foods may be higher in added sugar or refined carbs and should be avoided.  Talk to your dietitian to identify your daily goals for nutrients listed on the label. Shopping  Avoid buying canned, pre-made,  or processed foods. These foods tend to be high in fat, sodium, and added sugar.  Shop around the outside edge of the grocery store. This is where you will most often find fresh fruits and vegetables, bulk grains, fresh meats, and fresh dairy. Cooking  Use low-heat cooking methods, such as baking, instead of high-heat cooking methods like deep frying.  Cook using healthy oils, such as olive, canola, or sunflower oil.  Avoid cooking with butter, cream, or high-fat meats. Meal planning  Eat meals and snacks regularly, preferably at the same times every day. Avoid going long periods of time without eating.  Eat foods that are high in fiber, such as fresh fruits, vegetables, beans, and whole grains. Talk with your dietitian about how many servings of carbs you can eat at each meal.  Eat 4-6 oz (112-168 g) of lean protein each day, such as lean meat, chicken, fish, eggs, or tofu. One ounce (oz) of lean protein is equal to: ? 1 oz (28 g) of meat, chicken, or fish. ? 1 egg. ?  cup (62 g) of tofu.  Eat some foods each day that contain healthy fats, such as avocado, nuts, seeds, and fish.   What foods should I eat? Fruits Berries. Apples. Oranges. Peaches. Apricots. Plums. Grapes. Mango. Papaya. Pomegranate. Kiwi. Cherries. Vegetables Lettuce. Spinach. Leafy greens, including kale, chard, collard greens, and mustard greens. Beets. Cauliflower. Cabbage. Broccoli. Carrots. Green beans. Tomatoes. Peppers. Onions. Cucumbers. Brussels sprouts. Grains Whole grains, such as whole-wheat or whole-grain bread, crackers, tortillas, cereal, and pasta. Unsweetened oatmeal. Quinoa. Brown or wild rice. Meats and other proteins Seafood. Poultry without skin. Lean cuts of poultry and beef. Tofu. Nuts. Seeds. Dairy Low-fat or fat-free dairy products such as milk, yogurt, and cheese. The items listed above may not be a complete list of foods and beverages you can eat. Contact a dietitian for more  information. What foods should I avoid? Fruits Fruits canned with syrup. Vegetables Canned vegetables. Frozen vegetables with butter or cream sauce. Grains Refined white flour and flour products such as bread, pasta, snack foods, and cereals. Avoid all processed foods. Meats and other proteins Fatty cuts of meat. Poultry with skin. Breaded or fried meats. Processed meat. Avoid saturated fats. Dairy Full-fat yogurt, cheese, or milk. Beverages Sweetened drinks, such as soda or iced tea. The items listed above may not be a complete list of foods and beverages you should avoid. Contact a dietitian for more information. Questions to ask a health care provider  Do I need to meet with a diabetes educator?  Do I need to meet with a dietitian?  What number can I call if I have questions?  When are the best times to check my blood glucose? Where to find more information:  American Diabetes Association: diabetes.org  Academy of Nutrition and Dietetics: www.eatright.[employer] of Diabetes and Digestive and Kidney Diseases: [employer]  Association of  Diabetes Care and Education Specialists: www.diabeteseducator.org Summary  It is important to have healthy eating habits because your blood sugar (glucose) levels are greatly affected by what you eat and drink.  A healthy meal plan will help you control your blood glucose and maintain a healthy lifestyle.  Your health care provider may recommend that you work with a dietitian to make a meal plan that is best for you.  Keep in mind that carbohydrates (carbs) and alcohol have immediate effects on your blood glucose levels. It is important to count carbs and to use alcohol carefully. This information is not intended to replace advice given to you by your health care provider. Make sure you discuss any questions you have with your health care provider. Document Revised: 12/29/2018 Document Reviewed: 12/29/2018 Elsevier  Patient Education  2021 [employer].

## 2020-06-05 NOTE — Assessment & Plan Note (Signed)
Chronic, ongoing.  Continue collaboration with cardiology and current medication regimen as prescribed by them.   

## 2020-06-05 NOTE — Assessment & Plan Note (Signed)
Chronic, ongoing with BP at goal today.  Continue current medication regimen and collaboration with cardiology.  Recommend checking BP three mornings a week at home + documenting for providers.  CMP today.  Recommend DASH diet focus and avoidance of high sodium foods, praised for cutting back on pork skins.  Return in 3 months.

## 2020-06-05 NOTE — Assessment & Plan Note (Signed)
Chronic, ongoing.  Followed by endo.  A1C 6.6% today, praised for ongoing success.  Continue collaboration with endocrinology.  Continue current medication regimen and adjust as needed.  Recommend she continue to monitor BS at home 2-3 times a day.  Refills as needed.  Return in 3 months.

## 2020-06-05 NOTE — Progress Notes (Signed)
Contacted via MyChart   Good news Sara Zhang -- no pneumonia noted on imaging!!  Continue antibiotic at this time though until completion, with your history I feel better with this approach.

## 2020-06-05 NOTE — Assessment & Plan Note (Signed)
High risk for PNA or HF exacerbation -- negative Covid test at home this morning.  Will obtain CXR today, she is aware to obtain after visit. Due to high risk factors and history of PNA will start Augmentin x 7 days, script sent.  Will also start Flonase daily, as suspect some underlying allergic rhinitis + recommend she take daily Claritin OTC.  Tussionex as needed at night for cough, during daytime recommend Diabetic Tussin.  Albuterol inhaler sent to use as needed.  Recommend plenty of rest and fluid.  Return in one week for follow-up, sooner if worsening symptoms.

## 2020-06-05 NOTE — Assessment & Plan Note (Signed)
Chronic, ongoing.  Continue current use of O2 at night.

## 2020-06-05 NOTE — Assessment & Plan Note (Signed)
Chronic, ongoing.  Followed by endo.  A1C 6.6% today and urine ALB 80 last visit, praised for ongoing success with A1c.  Continue collaboration with endocrinology.  Continue current medication regimen and adjust as needed, Lisinopril offering kidney protection.  Recommend she continue to monitor BS at home 2-3 times a day.  Refills as needed.  Return in 3 months.

## 2020-06-06 ENCOUNTER — Other Ambulatory Visit: Payer: Self-pay | Admitting: Nurse Practitioner

## 2020-06-06 ENCOUNTER — Encounter: Payer: Self-pay | Admitting: Nurse Practitioner

## 2020-06-06 DIAGNOSIS — N183 Chronic kidney disease, stage 3 unspecified: Secondary | ICD-10-CM | POA: Insufficient documentation

## 2020-06-06 DIAGNOSIS — D729 Disorder of white blood cells, unspecified: Secondary | ICD-10-CM

## 2020-06-06 LAB — LIPID PANEL W/O CHOL/HDL RATIO
Cholesterol, Total: 114 mg/dL (ref 100–199)
HDL: 41 mg/dL (ref >39)
LDL Chol Calc (NIH): 59 mg/dL (ref 0–99)
Triglycerides: 66 mg/dL (ref 0–149)
VLDL Cholesterol Cal: 14 mg/dL (ref 5–40)

## 2020-06-06 LAB — COMPREHENSIVE METABOLIC PANEL
ALT: 10 IU/L (ref 0–32)
AST: 10 IU/L (ref 0–40)
Albumin/Globulin Ratio: 1.3 (ref 1.2–2.2)
Albumin: 3.9 g/dL (ref 3.8–4.9)
Alkaline Phosphatase: 103 IU/L (ref 44–121)
BUN/Creatinine Ratio: 17 (ref 9–23)
BUN: 20 mg/dL (ref 6–24)
Bilirubin Total: 0.3 mg/dL (ref 0.0–1.2)
CO2: 25 mmol/L (ref 20–29)
Calcium: 8.5 mg/dL — ABNORMAL LOW (ref 8.7–10.2)
Chloride: 104 mmol/L (ref 96–106)
Creatinine, Ser: 1.17 mg/dL — ABNORMAL HIGH (ref 0.57–1.00)
Globulin, Total: 3.1 g/dL (ref 1.5–4.5)
Glucose: 187 mg/dL — ABNORMAL HIGH (ref 65–99)
Potassium: 4.8 mmol/L (ref 3.5–5.2)
Sodium: 144 mmol/L (ref 134–144)
Total Protein: 7 g/dL (ref 6.0–8.5)
eGFR: 56 mL/min/{1.73_m2} — ABNORMAL LOW (ref >59)

## 2020-06-06 LAB — CBC WITH DIFFERENTIAL/PLATELET
Basophils Absolute: 0.1 10*3/uL (ref 0.0–0.2)
Basos: 1 %
EOS (ABSOLUTE): 0.4 10*3/uL (ref 0.0–0.4)
Eos: 2 %
Hematocrit: 40.6 % (ref 34.0–46.6)
Hemoglobin: 12.4 g/dL (ref 11.1–15.9)
Immature Grans (Abs): 0.1 10*3/uL (ref 0.0–0.1)
Immature Granulocytes: 1 %
Lymphocytes Absolute: 2.1 10*3/uL (ref 0.7–3.1)
Lymphs: 14 %
MCH: 26 pg — ABNORMAL LOW (ref 26.6–33.0)
MCHC: 30.5 g/dL — ABNORMAL LOW (ref 31.5–35.7)
MCV: 85 fL (ref 79–97)
Monocytes Absolute: 1.2 10*3/uL — ABNORMAL HIGH (ref 0.1–0.9)
Monocytes: 8 %
Neutrophils Absolute: 11.1 10*3/uL — ABNORMAL HIGH (ref 1.4–7.0)
Neutrophils: 74 %
Platelets: 320 10*3/uL (ref 150–450)
RBC: 4.77 x10E6/uL (ref 3.77–5.28)
RDW: 13.7 % (ref 11.7–15.4)
WBC: 15 10*3/uL — ABNORMAL HIGH (ref 3.4–10.8)

## 2020-06-06 LAB — HEPATITIS C ANTIBODY: Hep C Virus Ab: <0.1 s/co ratio (ref 0.0–0.9)

## 2020-06-06 NOTE — Progress Notes (Signed)
Contacted via MyChart   Good evening Sara Zhang, your labs have returned.  Cholesterol levels remain in good range.  Kidney function is showing some mild kidney disease which I will continue to monitor closely and may need to adjust medications or send to kidney doctor if any worsening.  Calcium mildly low, please ensure you are getting good intake of calcium daily.  Your white blood cell count is elevated again on this check and neutrophils elevated.  This could be related to infection.  I would like you to schedule to come back in 4 weeks to office for lab visit only, please schedule this with the front desk staff or by replying to this message.  I want to see if this is remaining high or trending down.  IF it continues to be elevated I may send you to hematology for further assessment.  Any questions? Keep being awesome!!  Thank you for allowing me to participate in your care. Kindest regards, Clorene Nerio

## 2020-06-12 DIAGNOSIS — Z6841 Body Mass Index (BMI) 40.0 and over, adult: Secondary | ICD-10-CM | POA: Diagnosis not present

## 2020-06-12 DIAGNOSIS — E1159 Type 2 diabetes mellitus with other circulatory complications: Secondary | ICD-10-CM | POA: Diagnosis not present

## 2020-06-12 DIAGNOSIS — R809 Proteinuria, unspecified: Secondary | ICD-10-CM | POA: Diagnosis not present

## 2020-06-12 DIAGNOSIS — Z794 Long term (current) use of insulin: Secondary | ICD-10-CM | POA: Diagnosis not present

## 2020-06-12 DIAGNOSIS — I152 Hypertension secondary to endocrine disorders: Secondary | ICD-10-CM | POA: Diagnosis not present

## 2020-06-12 DIAGNOSIS — E1169 Type 2 diabetes mellitus with other specified complication: Secondary | ICD-10-CM | POA: Diagnosis not present

## 2020-06-12 DIAGNOSIS — E1129 Type 2 diabetes mellitus with other diabetic kidney complication: Secondary | ICD-10-CM | POA: Diagnosis not present

## 2020-06-12 DIAGNOSIS — E785 Hyperlipidemia, unspecified: Secondary | ICD-10-CM | POA: Diagnosis not present

## 2020-06-19 ENCOUNTER — Ambulatory Visit: Payer: Medicare Other | Admitting: Nurse Practitioner

## 2020-06-23 ENCOUNTER — Other Ambulatory Visit: Payer: Self-pay

## 2020-06-23 ENCOUNTER — Encounter: Payer: Self-pay | Admitting: Nurse Practitioner

## 2020-06-23 ENCOUNTER — Ambulatory Visit (INDEPENDENT_AMBULATORY_CARE_PROVIDER_SITE_OTHER): Payer: Medicare Other | Admitting: Nurse Practitioner

## 2020-06-23 VITALS — BP 115/71 | HR 84 | Temp 99.2°F | Wt 344.4 lb

## 2020-06-23 DIAGNOSIS — D729 Disorder of white blood cells, unspecified: Secondary | ICD-10-CM | POA: Diagnosis not present

## 2020-06-23 DIAGNOSIS — R059 Cough, unspecified: Secondary | ICD-10-CM | POA: Diagnosis not present

## 2020-06-23 NOTE — Assessment & Plan Note (Addendum)
Noted on and off on labs over past year since her Covid infection.  Denies B symptoms.  She is scheduled for repeat labs next week, will review once completed and discussed with her if ongoing elevations may send to hematology for further assessment due to ongoing elevation WBC and neutrophils.

## 2020-06-23 NOTE — Patient Instructions (Signed)

## 2020-06-23 NOTE — Progress Notes (Signed)
BP 115/71   Pulse 84   Temp 99.2 F (37.3 C) (Oral)   Wt (!) 344 lb 6.4 oz (156.2 kg)   SpO2 94%   BMI 50.86 kg/m    Subjective:    Patient ID: Sara Zhang, female    DOB: May 26, 1970, 50 y.o.   MRN: [id number]  HPI: Sara Zhang is a 50 y.o. female  Chief Complaint  Patient presents with  . Cough    Patient states her cough has gotten better.    COUGH Follow-up for cough today, initially seen 06/05/20.  Treated with Augmentin and imaging showed no pneumonia.  Reports overall feeling better.  No further symptoms present.  Does not smoke, never a smoker.    History of pneumonia 02/05/20 and history of Covid 19 infection 02/02/2019 with hospitalization.  Did noticed some mild ongoing elevation in WBC and neutrophils recent visit. Duration: days Circumstances of initial development of cough: unknown Cough severity: moderate Cough description: productive -- cloudy and green Aggravating factors:  worse at night Alleviating factors: nothing Status:  fluctuating Treatments attempted: none Wheezing: yes Shortness of breath: a little more then baseline Chest pain: no Chest tightness:yes Nasal congestion: yes Runny nose: yes Postnasal drip: yes Frequent throat clearing or swallowing: yes Hemoptysis: no Fevers: no Night sweats: no Weight loss: no Heartburn: no Recent foreign travel: no Tuberculosis contacts: no  Relevant past medical, surgical, family and social history reviewed and updated as indicated. Interim medical history since our last visit reviewed. Allergies and medications reviewed and updated.  Review of Systems  Constitutional: Negative for activity change, appetite change, diaphoresis, fatigue and fever.  Respiratory: Negative for cough, chest tightness, shortness of breath and wheezing.   Cardiovascular: Negative for chest pain, palpitations and leg swelling.  Gastrointestinal: Negative.   Endocrine: Negative for polydipsia, polyphagia and polyuria.   Neurological: Negative.   Psychiatric/Behavioral: Negative.     Per HPI unless specifically indicated above     Objective:    BP 115/71   Pulse 84   Temp 99.2 F (37.3 C) (Oral)   Wt (!) 344 lb 6.4 oz (156.2 kg)   SpO2 94%   BMI 50.86 kg/m   Wt Readings from Last 3 Encounters:  06/23/20 (!) 344 lb 6.4 oz (156.2 kg)  06/05/20 (!) 347 lb 9.6 oz (157.7 kg)  03/27/20 (!) 345 lb (156.5 kg)    Physical Exam Vitals and nursing note reviewed.  Constitutional:      General: She is awake. She is not in acute distress.    Appearance: She is well-developed. She is morbidly obese. She is not ill-appearing.  HENT:     Head: Normocephalic.     Right Ear: Hearing normal.     Left Ear: Hearing normal.  Eyes:     General: Lids are normal.        Right eye: No discharge.        Left eye: No discharge.     Conjunctiva/sclera: Conjunctivae normal.     Pupils: Pupils are equal, round, and reactive to light.  Neck:     Thyroid: No thyromegaly.     Vascular: No carotid bruit.  Cardiovascular:     Rate and Rhythm: Normal rate and regular rhythm.     Heart sounds: Normal heart sounds. No murmur heard. No gallop.   Pulmonary:     Effort: Pulmonary effort is normal. No accessory muscle usage or respiratory distress.     Breath sounds: Normal breath sounds.  Comments: Lungs clear throughout. Abdominal:     General: Bowel sounds are normal.     Palpations: Abdomen is soft.  Musculoskeletal:     Cervical back: Normal range of motion and neck supple.     Right lower leg: No edema.     Left lower leg: No edema.  Skin:    General: Skin is warm and dry.  Neurological:     Mental Status: She is alert and oriented to person, place, and time.  Psychiatric:        Attention and Perception: Attention normal.        Mood and Affect: Mood normal.        Speech: Speech normal.        Behavior: Behavior normal. Behavior is cooperative.        Thought Content: Thought content normal.     Results for orders placed or performed in visit on 06/05/20  Bayer DCA Hb A1c Waived  Result Value Ref Range   HB A1C (BAYER DCA - WAIVED) 6.6 <7.0 %  Lipid Panel w/o Chol/HDL Ratio  Result Value Ref Range   Cholesterol, Total 114 100 - 199 mg/dL   Triglycerides 66 0 - 149 mg/dL   HDL 41 >39 mg/dL   VLDL Cholesterol Cal 14 5 - 40 mg/dL   LDL Chol Calc (NIH) 59 0 - 99 mg/dL  Comprehensive metabolic panel  Result Value Ref Range   Glucose 187 (H) 65 - 99 mg/dL   BUN 20 6 - 24 mg/dL   Creatinine, Ser 1.17 (H) 0.57 - 1.00 mg/dL   eGFR 56 (L) >59 mL/min/1.73   BUN/Creatinine Ratio 17 9 - 23   Sodium 144 134 - 144 mmol/L   Potassium 4.8 3.5 - 5.2 mmol/L   Chloride 104 96 - 106 mmol/L   CO2 25 20 - 29 mmol/L   Calcium 8.5 (L) 8.7 - 10.2 mg/dL   Total Protein 7.0 6.0 - 8.5 g/dL   Albumin 3.9 3.8 - 4.9 g/dL   Globulin, Total 3.1 1.5 - 4.5 g/dL   Albumin/Globulin Ratio 1.3 1.2 - 2.2   Bilirubin Total 0.3 0.0 - 1.2 mg/dL   Alkaline Phosphatase 103 44 - 121 IU/L   AST 10 0 - 40 IU/L   ALT 10 0 - 32 IU/L  CBC with Differential/Platelet  Result Value Ref Range   WBC 15.0 (H) 3.4 - 10.8 x10E3/uL   RBC 4.77 3.77 - 5.28 x10E6/uL   Hemoglobin 12.4 11.1 - 15.9 g/dL   Hematocrit 40.6 34.0 - 46.6 %   MCV 85 79 - 97 fL   MCH 26.0 (L) 26.6 - 33.0 pg   MCHC 30.5 (L) 31.5 - 35.7 g/dL   RDW 13.7 11.7 - 15.4 %   Platelets 320 150 - 450 x10E3/uL   Neutrophils 74 Not Estab. %   Lymphs 14 Not Estab. %   Monocytes 8 Not Estab. %   Eos 2 Not Estab. %   Basos 1 Not Estab. %   Neutrophils Absolute 11.1 (H) 1.4 - 7.0 x10E3/uL   Lymphocytes Absolute 2.1 0.7 - 3.1 x10E3/uL   Monocytes Absolute 1.2 (H) 0.1 - 0.9 x10E3/uL   EOS (ABSOLUTE) 0.4 0.0 - 0.4 x10E3/uL   Basophils Absolute 0.1 0.0 - 0.2 x10E3/uL   Immature Granulocytes 1 Not Estab. %   Immature Grans (Abs) 0.1 0.0 - 0.1 x10E3/uL  Hepatitis C antibody  Result Value Ref Range   Hep C Virus Ab <0.1 0.0 - 0.9 s/co  ratio      Assessment  & Plan:   Problem List Items Addressed This Visit      Other   Cough    Acute and improved at this time with no pneumonia noted on imaging.  Recommend continue good hydration at home and daily vitamins.  Return if symptoms present again.      Neutrophilia    Noted on and off on labs over past year since her Covid infection.  She is scheduled for repeat labs next week, will review once completed and discussed with her if ongoing elevations may send to hematology for further assessment due to ongoing elevation WBC and neutrophils.          Follow up plan: Return in about 3 months (around 09/23/2020) for T2DM, HTN/HLD, VIT D, TSH.

## 2020-06-23 NOTE — Assessment & Plan Note (Signed)
Acute and improved at this time with no pneumonia noted on imaging.  Recommend continue good hydration at home and daily vitamins.  Return if symptoms present again.

## 2020-06-25 ENCOUNTER — Other Ambulatory Visit: Payer: Self-pay | Admitting: Nurse Practitioner

## 2020-06-25 NOTE — Telephone Encounter (Signed)
Requested Prescriptions  Pending Prescriptions Disp Refills  . albuterol (VENTOLIN HFA) 108 (90 Base) MCG/ACT inhaler [Pharmacy Med Name: ALBUTEROL HFA INH(200 PUFFS)18GM] 18 g 0    Sig: INHALE 2 PUFFS INTO THE LUNGS EVERY 6 HOURS AS NEEDED FOR WHEEZING OR SHORTNESS OF BREATH     Pulmonology:  Beta Agonists Failed - 06/25/2020 10:45 AM      Failed - One inhaler should last at least one month. If the patient is requesting refills earlier, contact the patient to check for uncontrolled symptoms.      Passed - Valid encounter within last 12 months    Recent Outpatient Visits          2 days ago Cough   [facility] [place], [place] T, NP   2 weeks ago Type 2 diabetes mellitus treated with insulin (HCC)   [facility] [place], [place] T, NP   3 months ago Type 2 diabetes mellitus treated with insulin (HCC)   [facility] [place], [place] T, NP   4 months ago Pneumonia of both lower lobes due to infectious organism   [facility] [place], [place] T, NP   6 months ago Type 2 diabetes mellitus treated with insulin (HCC)   [facility] [place], Dorie Rank, NP      Future Appointments            In 2 months  [facility], [facility]   In 2 months Cannady, Dorie Rank, NP [facility], [facility]

## 2020-06-26 ENCOUNTER — Other Ambulatory Visit: Payer: Self-pay | Admitting: Nurse Practitioner

## 2020-07-24 ENCOUNTER — Telehealth: Payer: Self-pay | Admitting: Pharmacist

## 2020-07-24 NOTE — Chronic Care Management (AMB) (Signed)
Chronic Care Management Pharmacy Assistant   Name: Sara Zhang  MRN: [id number] DOB: [birth year]  Reason for Encounter: Disease State General Adherence  Recent office visits:  06/23/20-Jolene Cannady, NP (PCP) Seen for a cough. Follow up in 3 months. 06/05/20- Marnee Guarneri, NP (PCP) General follow up. Labs ordered. Follow up in 1 week. 02/02/2-Jolene Cannady, NP (PCP) Diabetic follow up and pneumonia f/up. Colo guard ordered. Labs ordered. Follow up in 3 months. 02/09/20-Jolene Ned Card, NP (PCP) Hospital follow up. Start on Augment 875-125 mg tab and (TUSSIONEX PENNKINETIC ER) 10-8 MG/5ML SURE. Follow up in 4 weeks. Recent consult visits:  06/12/20-Hilary Pasty Arch, NP (Endocrinology) Diabetic follow up. Instructed to monitor blood sugars twice daily, fasting in the morning and again before bed. Patient is reminded to check her blood sugar at least 2 hours after her evening meal. Follow up in 6 months. 04/17/20-Max Hyatt, DPM (Podiatry) Seen for ingrown toenail. Encouraged her to continue to soak until is absolutely no pain no drainage and no tenderness on palpation. 04/03/20-Max Hyatt, DPM (Podiatry) Seen for ingrown toenail. discontinue use of the Betadine and start with Epson salt warm water soaks cover during the day leave open at bedtime.  Also recommended Augmentin 875 mg take 1 tablet by mouth twice a day and will follow up with me in 2 weeks. 03/27/20-Saadat Humphrey Rolls, MD (Pulmonology) Seen for Obstructive sleep apena. 03/20/20-Max Hyatt, DPM (Podiatry) Seen for toe pain. Start on Cortisporin Otic to be applied twice daily after soaking. Follow-up with her in about 2 weeks. Hospital visits:  Medication Reconciliation was completed by comparing discharge summary, patient's EMR and Pharmacy list, and upon discussion with patient.  Admitted to the hospital on 02/05/20 due to Cough. Discharge date was 01/01/022. Discharged from [facility]?Medications Started at  [facility] Discharge:?? -started  (VENTOLIN HFA) 108 (90 Base) MCG/ACT inhaler  Every 4 hours PRN (DELTASONE) 50 MG tablet      ZITHROMAX) 250 MG tablet  Daily   (TUSSIONEX PENNKINETIC ER) 10-8 MG/5ML SUER  2 times daily         Medication Changes at Hospital Discharge: -Changed None noted  Medications Discontinued at Hospital Discharge: -Stopped None noted  Medications that remain the same after Hospital Discharge:??  -All other medications will remain the same.    Medications: Outpatient Encounter Medications as of 07/24/2020  Medication Sig   albuterol (VENTOLIN HFA) 108 (90 Base) MCG/ACT inhaler INHALE 2 PUFFS INTO THE LUNGS EVERY 6 HOURS AS NEEDED FOR WHEEZING OR SHORTNESS OF BREATH   amLODipine (NORVASC) 5 MG tablet TAKE 1 TABLET(5 MG) BY MOUTH DAILY   atenolol (TENORMIN) 50 MG tablet Take 1 tablet (50 mg total) by mouth daily.   atorvastatin (LIPITOR) 10 MG tablet TAKE 1 TABLET(10 MG) BY MOUTH DAILY   Blood Glucose Monitoring Suppl (CONTOUR NEXT MONITOR) w/Device KIT To check blood sugar 4 times daily.   Calcium Carbonate-Vitamin D (OYSTER SHELL CALCIUM 500 + D) 500-125 MG-UNIT TABS Take 1 tablet by mouth daily.    Cholecalciferol (VITAMIN D3) 10 MCG (400 UNIT) tablet Take 400 Units by mouth daily.   Exenatide ER (BYDUREON BCISE) 2 MG/0.85ML AUIJ INJECT 2 MG UNDER THE SKIN EVERY WEEK   FLOWFLEX COVID-19 AG HOME TEST KIT TEST AS DIRECTED TODAY   fluticasone (FLONASE) 50 MCG/ACT nasal spray Place 2 sprays into both nostrils daily.   furosemide (LASIX) 40 MG tablet Take 1 tablet (40 mg total) by mouth 2 (two) times daily.  glucose blood (CONTOUR NEXT TEST) test strip TEST BLOOD SUGAR ONCE DAILY   Insulin Glargine (TOUJEO SOLOSTAR) 300 UNIT/ML SOPN Inject 54 Units into the skin at bedtime.   Insulin Pen Needle 32G X 4 MM MISC 1 Units by Does not apply route every morning. Pen needles   JARDIANCE 25 MG TABS tablet Take 1 tablet (25 mg total) by mouth daily.   Lancets (ONETOUCH  ULTRASOFT) lancets Use as instructed   levonorgestrel (MIRENA) 20 MCG/24HR IUD 1 each by Intrauterine route once.    lisinopril (ZESTRIL) 40 MG tablet TAKE 1 TABLET BY MOUTH EVERY DAY   OXYGEN Inhale 2 L into the lungs daily.   No facility-administered encounter medications on file as of 07/24/2020.    Unable to reach patient for her Disease state call for General Adherence.   Star Rating Drugs: Atorvastatin 10 mg Last filled:06/05/20 90 DS Lisinopril 40 mg Last filled:06/02/20 90 DS Jardiance 25 mg Last filled:06/29/20 90 DS   Myriam Elta [place], Power

## 2020-08-01 ENCOUNTER — Telehealth (INDEPENDENT_AMBULATORY_CARE_PROVIDER_SITE_OTHER): Payer: Medicare Other | Admitting: Family Medicine

## 2020-08-01 ENCOUNTER — Encounter: Payer: Self-pay | Admitting: Family Medicine

## 2020-08-01 ENCOUNTER — Telehealth: Payer: Self-pay

## 2020-08-01 ENCOUNTER — Other Ambulatory Visit: Payer: Self-pay

## 2020-08-01 DIAGNOSIS — J069 Acute upper respiratory infection, unspecified: Secondary | ICD-10-CM | POA: Diagnosis not present

## 2020-08-01 MED ORDER — BENZONATATE 200 MG PO CAPS: 200.0000 mg | ORAL_CAPSULE | Freq: Two times a day (BID) | ORAL | 0 refills | Status: DC | PRN

## 2020-08-01 MED ORDER — HYDROCOD POLST-CPM POLST ER 10-8 MG/5ML PO SUER: 5.0000 mL | Freq: Two times a day (BID) | ORAL | 0 refills | Status: DC | PRN

## 2020-08-01 MED ORDER — PREDNISONE 10 MG PO TABS: ORAL_TABLET | ORAL | 0 refills | Status: DC

## 2020-08-01 NOTE — Telephone Encounter (Signed)
Lvm to change today's appt to virtual

## 2020-08-01 NOTE — Progress Notes (Signed)
There were no vitals taken for this visit.   Subjective:    Patient ID: Sara Zhang, female    DOB: Aug 21, 1970, 50 y.o.   MRN: [id number]  HPI: Sara Zhang is a 50 y.o. female  Chief Complaint  Patient presents with   Hoarse    Started on Thursday    Sore Throat   Cough    Started on Thursday    Fatigue   Nasal Congestion    Started on Thursday    UPPER RESPIRATORY TRACT INFECTION- has tested and is COVID negative Duration: 5 days Worst symptom: hoarseness, cough Fever: yes Cough: yes Shortness of breath: yes Wheezing: yes Chest pain: no Chest tightness: no Chest congestion: yes Nasal congestion: yes Runny nose: yes Post nasal drip: yes Sneezing: no Sore throat: yes Swollen glands: no Sinus pressure: yes Headache: yes Face pain: no Toothache: no Ear pain: no  Ear pressure: no  Eyes red/itching:no Eye drainage/crusting: no  Vomiting: no Rash: no Fatigue: yes Sick contacts: yes Strep contacts: no  Context: worse Recurrent sinusitis: no Relief with OTC cold/cough medications: no  Treatments attempted: none    Relevant past medical, surgical, family and social history reviewed and updated as indicated. Interim medical history since our last visit reviewed. Allergies and medications reviewed and updated.  Review of Systems  Constitutional:  Positive for chills, diaphoresis, fatigue and fever. Negative for activity change, appetite change and unexpected weight change.  HENT:  Positive for congestion, postnasal drip, rhinorrhea, sore throat and voice change. Negative for dental problem, drooling, ear discharge, ear pain, facial swelling, hearing loss, mouth sores, nosebleeds, sinus pressure, sinus pain, sneezing, tinnitus and trouble swallowing.   Eyes: Negative.   Respiratory:  Positive for cough. Negative for apnea, choking, chest tightness, shortness of breath, wheezing and stridor.   Cardiovascular: Negative.   Gastrointestinal: Negative.    Psychiatric/Behavioral: Negative.     Per HPI unless specifically indicated above     Objective:    There were no vitals taken for this visit.  Wt Readings from Last 3 Encounters:  06/23/20 (!) 344 lb 6.4 oz (156.2 kg)  06/05/20 (!) 347 lb 9.6 oz (157.7 kg)  03/27/20 (!) 345 lb (156.5 kg)    Physical Exam Vitals and nursing note reviewed.  Constitutional:      General: She is not in acute distress.    Appearance: Normal appearance. She is not ill-appearing, toxic-appearing or diaphoretic.     Comments: Very hoarse  HENT:     Head: Normocephalic and atraumatic.     Right Ear: External ear normal.     Left Ear: External ear normal.     Nose: Nose normal.     Mouth/Throat:     Mouth: Mucous membranes are moist.     Pharynx: Oropharynx is clear.  Eyes:     General: No scleral icterus.       Right eye: No discharge.        Left eye: No discharge.     Conjunctiva/sclera: Conjunctivae normal.     Pupils: Pupils are equal, round, and reactive to light.  Pulmonary:     Effort: Pulmonary effort is normal. No respiratory distress.     Comments: Speaking in full sentences Musculoskeletal:        General: Normal range of motion.     Cervical back: Normal range of motion.  Skin:    Coloration: Skin is not jaundiced or pale.     Findings: No bruising, erythema,  lesion or rash.  Neurological:     Mental Status: She is alert and oriented to person, place, and time. Mental status is at baseline.  Psychiatric:        Mood and Affect: Mood normal.        Behavior: Behavior normal.        Thought Content: Thought content normal.        Judgment: Judgment normal.    Results for orders placed or performed in visit on 06/05/20  Bayer DCA Hb A1c Waived  Result Value Ref Range   HB A1C (BAYER DCA - WAIVED) 6.6 <7.0 %  Lipid Panel w/o Chol/HDL Ratio  Result Value Ref Range   Cholesterol, Total 114 100 - 199 mg/dL   Triglycerides 66 0 - 149 mg/dL   HDL 41 >39 mg/dL   VLDL  Cholesterol Cal 14 5 - 40 mg/dL   LDL Chol Calc (NIH) 59 0 - 99 mg/dL  Comprehensive metabolic panel  Result Value Ref Range   Glucose 187 (H) 65 - 99 mg/dL   BUN 20 6 - 24 mg/dL   Creatinine, Ser 1.17 (H) 0.57 - 1.00 mg/dL   eGFR 56 (L) >59 mL/min/1.73   BUN/Creatinine Ratio 17 9 - 23   Sodium 144 134 - 144 mmol/L   Potassium 4.8 3.5 - 5.2 mmol/L   Chloride 104 96 - 106 mmol/L   CO2 25 20 - 29 mmol/L   Calcium 8.5 (L) 8.7 - 10.2 mg/dL   Total Protein 7.0 6.0 - 8.5 g/dL   Albumin 3.9 3.8 - 4.9 g/dL   Globulin, Total 3.1 1.5 - 4.5 g/dL   Albumin/Globulin Ratio 1.3 1.2 - 2.2   Bilirubin Total 0.3 0.0 - 1.2 mg/dL   Alkaline Phosphatase 103 44 - 121 IU/L   AST 10 0 - 40 IU/L   ALT 10 0 - 32 IU/L  CBC with Differential/Platelet  Result Value Ref Range   WBC 15.0 (H) 3.4 - 10.8 x10E3/uL   RBC 4.77 3.77 - 5.28 x10E6/uL   Hemoglobin 12.4 11.1 - 15.9 g/dL   Hematocrit 40.6 34.0 - 46.6 %   MCV 85 79 - 97 fL   MCH 26.0 (L) 26.6 - 33.0 pg   MCHC 30.5 (L) 31.5 - 35.7 g/dL   RDW 13.7 11.7 - 15.4 %   Platelets 320 150 - 450 x10E3/uL   Neutrophils 74 Not Estab. %   Lymphs 14 Not Estab. %   Monocytes 8 Not Estab. %   Eos 2 Not Estab. %   Basos 1 Not Estab. %   Neutrophils Absolute 11.1 (H) 1.4 - 7.0 x10E3/uL   Lymphocytes Absolute 2.1 0.7 - 3.1 x10E3/uL   Monocytes Absolute 1.2 (H) 0.1 - 0.9 x10E3/uL   EOS (ABSOLUTE) 0.4 0.0 - 0.4 x10E3/uL   Basophils Absolute 0.1 0.0 - 0.2 x10E3/uL   Immature Granulocytes 1 Not Estab. %   Immature Grans (Abs) 0.1 0.0 - 0.1 x10E3/uL  Hepatitis C antibody  Result Value Ref Range   Hep C Virus Ab <0.1 0.0 - 0.9 s/co ratio      Assessment & Plan:   Problem List Items Addressed This Visit   None Visit Diagnoses     Upper respiratory tract infection, unspecified type    -  Primary   Will treat with prednisone, tussionex and tessalon. If not better by Thursday- call and will send in abx. Follow up in person in 2 weeks. Call with any concerns  Follow up plan: Return today (on 08/01/2020) for in person follow up with PCP.   This visit was completed via video visit through MyChart due to the restrictions of the COVID-19 pandemic. All issues as above were discussed and addressed. Physical exam was done as above through visual confirmation on video through MyChart. If it was felt that the patient should be evaluated in the office, they were directed there. The patient verbally consented to this visit. Location of the patient: home Location of the provider: home Those involved with this call:  Provider: Park Liter, DO CMA: Frazier Butt, Terry Desk/Registration: Jill Side  Time spent on call:  15 minutes with patient face to face via video conference. More than 50% of this time was spent in counseling and coordination of care. 23 minutes total spent in review of patient's record and preparation of their chart.

## 2020-08-02 ENCOUNTER — Telehealth: Payer: Self-pay

## 2020-08-02 NOTE — Telephone Encounter (Signed)
Lvm to make this apt. 

## 2020-08-02 NOTE — Telephone Encounter (Signed)
-----   Message from Dorcas Carrow, DO sent at 08/01/2020  2:40 PM EDT ----- 2weeks in person follow up with PCP

## 2020-08-02 NOTE — Telephone Encounter (Signed)
Pt scheduled per pec on 08/15/2020

## 2020-08-03 ENCOUNTER — Telehealth: Payer: Self-pay | Admitting: Pharmacist

## 2020-08-03 NOTE — Chronic Care Management (AMB) (Signed)
    Chronic Care Management Pharmacy Assistant   Name: Sara Zhang  MRN: [id number] DOB: Feb 14, 1970   Reason for Encounter: Chart Review    Medications: Outpatient Encounter Medications as of 08/03/2020  Medication Sig   albuterol (VENTOLIN HFA) 108 (90 Base) MCG/ACT inhaler INHALE 2 PUFFS INTO THE LUNGS EVERY 6 HOURS AS NEEDED FOR WHEEZING OR SHORTNESS OF BREATH   amLODipine (NORVASC) 5 MG tablet TAKE 1 TABLET(5 MG) BY MOUTH DAILY   atenolol (TENORMIN) 50 MG tablet Take 1 tablet (50 mg total) by mouth daily.   atorvastatin (LIPITOR) 10 MG tablet TAKE 1 TABLET(10 MG) BY MOUTH DAILY   benzonatate (TESSALON) 200 MG capsule Take 1 capsule (200 mg total) by mouth 2 (two) times daily as needed for cough.   Blood Glucose Monitoring Suppl (CONTOUR NEXT MONITOR) w/Device KIT To check blood sugar 4 times daily.   Calcium Carbonate-Vitamin D (OYSTER SHELL CALCIUM 500 + D) 500-125 MG-UNIT TABS Take 1 tablet by mouth daily.    chlorpheniramine-HYDROcodone (TUSSIONEX PENNKINETIC ER) 10-8 MG/5ML SUER Take 5 mLs by mouth every 12 (twelve) hours as needed.   Cholecalciferol (VITAMIN D3) 10 MCG (400 UNIT) tablet Take 400 Units by mouth daily.   Exenatide ER (BYDUREON BCISE) 2 MG/0.85ML AUIJ INJECT 2 MG UNDER THE SKIN EVERY WEEK   FLOWFLEX COVID-19 AG HOME TEST KIT TEST AS DIRECTED TODAY   fluticasone (FLONASE) 50 MCG/ACT nasal spray Place 2 sprays into both nostrils daily.   furosemide (LASIX) 40 MG tablet Take 1 tablet (40 mg total) by mouth 2 (two) times daily.   glucose blood (CONTOUR NEXT TEST) test strip TEST BLOOD SUGAR ONCE DAILY   Insulin Glargine (TOUJEO SOLOSTAR) 300 UNIT/ML SOPN Inject 54 Units into the skin at bedtime.   Insulin Pen Needle 32G X 4 MM MISC 1 Units by Does not apply route every morning. Pen needles   JARDIANCE 25 MG TABS tablet Take 1 tablet (25 mg total) by mouth daily.   Lancets (ONETOUCH ULTRASOFT) lancets Use as instructed   levonorgestrel (MIRENA) 20 MCG/24HR IUD 1  each by Intrauterine route once.    lisinopril (ZESTRIL) 40 MG tablet TAKE 1 TABLET BY MOUTH EVERY DAY   OXYGEN Inhale 2 L into the lungs daily.   predniSONE (DELTASONE) 10 MG tablet 6 tabs today and tomorrow, 5 tabs the next 2 days, decrease by 1 every other day until gone   No facility-administered encounter medications on file as of 08/03/2020.   Reviewed chart for medication changes and adherence.  Recent OV, Consult or Hospital visit:  08/01/20 Park Liter, DO PCP Seen for upper respiratory infection. Start Benzonatate 200 mg, prednsione 10 mg and Tussionex.  Recent medication changes indicated: Start Benzonatate 200 mg, prednsione 10 mg and Tussionex.  No gaps in adherence identified. Patient has follow up scheduled with pharmacy team. No further action required.  Lizbeth Bark Clinical Pharmacist Assistant [phone]

## 2020-08-09 ENCOUNTER — Other Ambulatory Visit: Payer: Self-pay

## 2020-08-09 ENCOUNTER — Other Ambulatory Visit: Payer: Medicare Other

## 2020-08-09 DIAGNOSIS — D729 Disorder of white blood cells, unspecified: Secondary | ICD-10-CM | POA: Diagnosis not present

## 2020-08-10 ENCOUNTER — Other Ambulatory Visit: Payer: Self-pay | Admitting: Nurse Practitioner

## 2020-08-10 ENCOUNTER — Ambulatory Visit
Admission: RE | Admit: 2020-08-10 | Discharge: 2020-08-10 | Disposition: A | Discharge status: Home / Self Care | Payer: Medicare Other | Attending: Nurse Practitioner | Admitting: Nurse Practitioner

## 2020-08-10 ENCOUNTER — Ambulatory Visit
Admission: RE | Admit: 2020-08-10 | Discharge: 2020-08-10 | Disposition: A | Discharge status: Home / Self Care | Payer: Medicare Other | Source: Ambulatory Visit | Attending: Nurse Practitioner | Admitting: Nurse Practitioner

## 2020-08-10 DIAGNOSIS — R0602 Shortness of breath: Secondary | ICD-10-CM | POA: Diagnosis not present

## 2020-08-10 DIAGNOSIS — R059 Cough, unspecified: Secondary | ICD-10-CM | POA: Diagnosis not present

## 2020-08-10 DIAGNOSIS — D729 Disorder of white blood cells, unspecified: Secondary | ICD-10-CM | POA: Insufficient documentation

## 2020-08-10 DIAGNOSIS — D72829 Elevated white blood cell count, unspecified: Secondary | ICD-10-CM | POA: Diagnosis not present

## 2020-08-10 LAB — CBC WITH DIFFERENTIAL/PLATELET
Basophils Absolute: 0.1 10*3/uL (ref 0.0–0.2)
Basos: 0 %
EOS (ABSOLUTE): 0.3 10*3/uL (ref 0.0–0.4)
Eos: 2 %
Hematocrit: 40.9 % (ref 34.0–46.6)
Hemoglobin: 12.6 g/dL (ref 11.1–15.9)
Immature Grans (Abs): 0.1 10*3/uL (ref 0.0–0.1)
Immature Granulocytes: 1 %
Lymphocytes Absolute: 4.7 10*3/uL — ABNORMAL HIGH (ref 0.7–3.1)
Lymphs: 35 %
MCH: 26.1 pg — ABNORMAL LOW (ref 26.6–33.0)
MCHC: 30.8 g/dL — ABNORMAL LOW (ref 31.5–35.7)
MCV: 85 fL (ref 79–97)
Monocytes Absolute: 1.2 10*3/uL — ABNORMAL HIGH (ref 0.1–0.9)
Monocytes: 9 %
Neutrophils Absolute: 7.2 10*3/uL — ABNORMAL HIGH (ref 1.4–7.0)
Neutrophils: 53 %
Platelets: 384 10*3/uL (ref 150–450)
RBC: 4.83 x10E6/uL (ref 3.77–5.28)
RDW: 13.4 % (ref 11.7–15.4)
WBC: 13.6 10*3/uL — ABNORMAL HIGH (ref 3.4–10.8)

## 2020-08-10 LAB — C-REACTIVE PROTEIN: CRP: 4 mg/L (ref 0–10)

## 2020-08-10 LAB — SEDIMENTATION RATE: Sed Rate: 43 mm/hr — ABNORMAL HIGH (ref 0–40)

## 2020-08-10 NOTE — Progress Notes (Signed)
Contacted via [place] morning Sara Zhang, your white blood cell count remains slightly elevated and neutrophils + lymphocytes.  CRP is normal and ESR mildly elevated, this all could be related to ongoing recent upper respiratory infection.  I would like you to head for a repeat chest x-ray at your convenience to [facility], where you have obtained them before, and we will recheck labs again at upcoming visit end of July.  How are you feeling?  Any questions? Keep being amazing!!  Thank you for allowing me to participate in your care.  I appreciate you. Kindest regards, Livingston Denner

## 2020-08-10 NOTE — Progress Notes (Signed)
Contacted via MyChart   Good afternoon Sara Zhang, Chest x-ray is negative for any acute issues.  Good news!!

## 2020-08-15 ENCOUNTER — Encounter: Payer: Self-pay | Admitting: Nurse Practitioner

## 2020-08-15 ENCOUNTER — Ambulatory Visit (INDEPENDENT_AMBULATORY_CARE_PROVIDER_SITE_OTHER): Payer: Medicare Other | Admitting: Nurse Practitioner

## 2020-08-15 ENCOUNTER — Other Ambulatory Visit: Payer: Self-pay

## 2020-08-15 VITALS — BP 105/65 | HR 81 | Temp 98.7°F | Wt 342.2 lb

## 2020-08-15 DIAGNOSIS — D729 Disorder of white blood cells, unspecified: Secondary | ICD-10-CM | POA: Diagnosis not present

## 2020-08-15 DIAGNOSIS — R059 Cough, unspecified: Secondary | ICD-10-CM | POA: Diagnosis not present

## 2020-08-15 MED ORDER — BENZONATATE 200 MG PO CAPS: 200.0000 mg | ORAL_CAPSULE | Freq: Two times a day (BID) | ORAL | 4 refills | Status: DC | PRN

## 2020-08-15 MED ORDER — FLUTICASONE PROPIONATE 50 MCG/ACT NA SUSP: 2.0000 | Freq: Every day | NASAL | 6 refills | Status: DC

## 2020-08-15 MED ORDER — LORATADINE 10 MG PO TABS: 10.0000 mg | ORAL_TABLET | Freq: Every day | ORAL | 11 refills | Status: DC

## 2020-08-15 NOTE — Patient Instructions (Signed)
Allergic Rhinitis, Adult Allergic rhinitis is a reaction to allergens. Allergens are things that can cause an allergic reaction. This condition affects the lining inside the nose (mucous membrane). There are two types of allergic rhinitis: Seasonal. This type is also called hay fever. It happens only during some times of the year. Perennial. This type can happen at any time of the year. This condition cannot be spread from person to person (is not contagious). It can be mild, worse, or very bad. It can develop at any age and may beoutgrown. What are the causes? This condition may be caused by: Pollen from grasses, trees, and weeds. Dust mites. Smoke. Mold. Car fumes. The pee (urine), spit, or dander of pets. Dander is dead skin cells from a pet. What increases the risk? You are more likely to develop this condition if: You have allergies in your family. You have problems like allergies in your family. You may have: Swelling of parts of your eyes and eyelids. Asthma. This affects how you breathe. Long-term redness and swelling on your skin. Food allergies. What are the signs or symptoms? The main symptom of this condition is a runny or stuffy nose (nasal congestion). Other symptoms may include: Sneezing or coughing. Itching and tearing of your eyes. Mucus that drips down the back of your throat (postnasal drip). Trouble sleeping. Feeling tired. Headache. Sore throat. How is this treated? There is no cure for this condition. You should avoid things that you are allergic to. Treatment can help to relieve symptoms. This may include: Medicines that block allergy symptoms, such as corticosteroids or antihistamines. These may be given as a shot, nasal spray, or pill. Avoiding things you are allergic to. Medicines that give you bits of what you are allergic to over time. This is called immunotherapy. It is done if other treatments do not help. You may get: Shots. Medicine under your  tongue. Stronger medicines, if other treatments do not help. Follow these instructions at home: Avoiding allergens Find out what things you are allergic to and avoid them. To do this, try these things: If you get allergies any time of year: Replace carpet with wood, tile, or vinyl flooring. Carpet can trap pet dander and dust. Do not smoke. Do not allow smoking in your home. Change your heating and air conditioning filters at least once a month. If you get allergies only some times of the year: Keep windows closed when you can. Plan things to do outside when pollen counts are lowest. Check pollen counts before you plan things to do outside. When you come indoors, change your clothes and shower before you sit on furniture or bedding. If you are allergic to a pet: Keep the pet out of your bedroom. Vacuum, sweep, and dust often.  General instructions Take over-the-counter and prescription medicines only as told by your doctor. Drink enough fluid to keep your pee (urine) pale yellow. Keep all follow-up visits as told by your doctor. This is important. Where to find more information American Academy of Allergy, Asthma & Immunology: www.aaaai.org Contact a doctor if: You have a fever. You get a cough that does not go away. You make whistling sounds when you breathe (wheeze). Your symptoms slow you down. Your symptoms stop you from doing your normal things each day. Get help right away if: You are short of breath. This symptom may be an emergency. Do not wait to see if the symptom will go away. Get medical help right away. Call your local emergency   services (911 in the [place] Do not drive yourself to the hospital. Summary Allergic rhinitis may be treated by taking medicines and avoiding things you are allergic to. If you have allergies only some of the year, keep windows closed when you can at those times. Contact your doctor if you get a fever or a cough that does not go away. This  information is not intended to replace advice given to you by your health care provider. Make sure you discuss any questions you have with your healthcare provider. Document Revised: 03/15/2019 Document Reviewed: 01/19/2019 Elsevier Patient Education  2022 Elsevier Inc.  

## 2020-08-15 NOTE — Progress Notes (Signed)
BP 105/65   Pulse 81   Temp 98.7 F (37.1 C) (Oral)   Wt (!) 342 lb 3.2 oz (155.2 kg)   SpO2 97%   BMI 50.53 kg/m    Subjective:    Patient ID: Sara Zhang, female    DOB: [birth year], 50 y.o.   MRN: [id number]  HPI: Sara Zhang is a 50 y.o. female  Chief Complaint  Patient presents with   URI    Patient states she is feeling better since her last visit. She has completed all courses of treatment.    UPPER RESPIRATORY TRACT INFECTION Follow-up today for URI treated with Prednisone and Tessalon on 08/01/20.  CXR 08/10/20 was negative for acute findings and recent labs 08/09/20 continue to show mild elevation in WBC at 13.6, neuts 7.2 -- this has been intermittently ongoing since her Covid hospitalization one year ago.  Denies fevers, night sweats, loss of appetite, weight loss.  Cough has been ongoing off and on since her Covid hospitalization, sometimes with worsening.  Rainy weather exacerbates or change of weather.  No SOB, CP.  Non smoker.   Fever: no Cough:  yesterday had a little increase in coughing Shortness of breath: no Wheezing: no Chest pain: no Chest tightness: no Chest congestion: no Nasal congestion: no Runny nose: no Post nasal drip: yes Sneezing: no Sore throat: no Swollen glands: no Sinus pressure: no Headache: no Face pain: no Toothache: no Ear pain: none Ear pressure: none Eyes red/itching:no Eye drainage/crusting: no  Vomiting: no Rash: no Fatigue: no Sick contacts: no Strep contacts: no  Context: better Recurrent sinusitis: no Relief with OTC cold/cough medications: yes  Treatments attempted:  Prednisone, cold/sinus, and mucinex    Relevant past medical, surgical, family and social history reviewed and updated as indicated. Interim medical history since our last visit reviewed. Allergies and medications reviewed and updated.  Review of Systems  Constitutional:  Negative for activity change, appetite change, diaphoresis, fatigue and  fever.  Respiratory:  Positive for cough. Negative for chest tightness, shortness of breath and wheezing.   Cardiovascular:  Negative for chest pain, palpitations and leg swelling.  Gastrointestinal: Negative.   Neurological: Negative.   Psychiatric/Behavioral: Negative.     Per HPI unless specifically indicated above     Objective:    BP 105/65   Pulse 81   Temp 98.7 F (37.1 C) (Oral)   Wt (!) 342 lb 3.2 oz (155.2 kg)   SpO2 97%   BMI 50.53 kg/m   Wt Readings from Last 3 Encounters:  08/15/20 (!) 342 lb 3.2 oz (155.2 kg)  06/23/20 (!) 344 lb 6.4 oz (156.2 kg)  06/05/20 (!) 347 lb 9.6 oz (157.7 kg)    Physical Exam Vitals and nursing note reviewed.  Constitutional:      General: She is awake. She is not in acute distress.    Appearance: She is well-developed. She is morbidly obese. She is not ill-appearing.  HENT:     Head: Normocephalic.     Right Ear: Hearing normal.     Left Ear: Hearing normal.  Eyes:     General: Lids are normal.        Right eye: No discharge.        Left eye: No discharge.     Conjunctiva/sclera: Conjunctivae normal.     Pupils: Pupils are equal, round, and reactive to light.  Neck:     Thyroid: No thyromegaly.     Vascular: No carotid bruit.  Cardiovascular:     Rate and Rhythm: Normal rate and regular rhythm.     Heart sounds: Normal heart sounds. No murmur heard.   No gallop.  Pulmonary:     Effort: Pulmonary effort is normal. No accessory muscle usage or respiratory distress.     Breath sounds: Normal breath sounds.     Comments: Lungs clear throughout.  No SOB with talking.  Intermittent dry cough noted. Abdominal:     General: Bowel sounds are normal.     Palpations: Abdomen is soft.  Musculoskeletal:     Cervical back: Normal range of motion and neck supple.     Right lower leg: No edema.     Left lower leg: No edema.  Skin:    General: Skin is warm and dry.  Neurological:     Mental Status: She is alert and oriented to  person, place, and time.  Psychiatric:        Attention and Perception: Attention normal.        Mood and Affect: Mood normal.        Speech: Speech normal.        Behavior: Behavior normal. Behavior is cooperative.        Thought Content: Thought content normal.    Results for orders placed or performed in visit on 08/09/20  Sed Rate (ESR)  Result Value Ref Range   Sed Rate 43 (H) 0 - 40 mm/hr  C-reactive protein  Result Value Ref Range   CRP 4 0 - 10 mg/L  CBC with Differential/Platelet  Result Value Ref Range   WBC 13.6 (H) 3.4 - 10.8 x10E3/uL   RBC 4.83 3.77 - 5.28 x10E6/uL   Hemoglobin 12.6 11.1 - 15.9 g/dL   Hematocrit 40.9 34.0 - 46.6 %   MCV 85 79 - 97 fL   MCH 26.1 (L) 26.6 - 33.0 pg   MCHC 30.8 (L) 31.5 - 35.7 g/dL   RDW 13.4 11.7 - 15.4 %   Platelets 384 150 - 450 x10E3/uL   Neutrophils 53 Not Estab. %   Lymphs 35 Not Estab. %   Monocytes 9 Not Estab. %   Eos 2 Not Estab. %   Basos 0 Not Estab. %   Neutrophils Absolute 7.2 (H) 1.4 - 7.0 x10E3/uL   Lymphocytes Absolute 4.7 (H) 0.7 - 3.1 x10E3/uL   Monocytes Absolute 1.2 (H) 0.1 - 0.9 x10E3/uL   EOS (ABSOLUTE) 0.3 0.0 - 0.4 x10E3/uL   Basophils Absolute 0.1 0.0 - 0.2 x10E3/uL   Immature Granulocytes 1 Not Estab. %   Immature Grans (Abs) 0.1 0.0 - 0.1 x10E3/uL      Assessment & Plan:   Problem List Items Addressed This Visit       Other   Cough - Primary    With recent treatment, reports feeling better, but has had ongoing nagging cough intermittently since Covid over a year ago.  Recent imaging negative for acute findings.  Will send in refill on Tessalon to use as needed and recommend use of Albuterol as needed.  Trial Claritin 10 MG daily and Flonase -- scripts sent, as suspect this is more allergy related.  Return in August as scheduled, could consider trial off Lisinopril if ongoing cough or referral to pulmonary.       Neutrophilia    Recent Prednisone and URI treatment, plan on recheck on 09/21/20 at  visit and may consider hematology referral.         Follow up plan: Return  for as scheduled 09/21/20.

## 2020-08-15 NOTE — Assessment & Plan Note (Signed)
With recent treatment, reports feeling better, but has had ongoing nagging cough intermittently since Covid over a year ago.  Recent imaging negative for acute findings.  Will send in refill on Tessalon to use as needed and recommend use of Albuterol as needed.  Trial Claritin 10 MG daily and Flonase -- scripts sent, as suspect this is more allergy related.  Return in August as scheduled, could consider trial off Lisinopril if ongoing cough or referral to pulmonary.

## 2020-08-15 NOTE — Assessment & Plan Note (Signed)
Recent Prednisone and URI treatment, plan on recheck on 09/21/20 at visit and may consider hematology referral.

## 2020-08-26 ENCOUNTER — Other Ambulatory Visit: Payer: Self-pay | Admitting: Nurse Practitioner

## 2020-08-26 NOTE — Telephone Encounter (Signed)
last RF 06/24/20 #90 4 RF

## 2020-09-01 ENCOUNTER — Encounter: Payer: Self-pay | Admitting: Nurse Practitioner

## 2020-09-01 ENCOUNTER — Telehealth (INDEPENDENT_AMBULATORY_CARE_PROVIDER_SITE_OTHER): Payer: Medicare Other | Admitting: Nurse Practitioner

## 2020-09-01 DIAGNOSIS — R059 Cough, unspecified: Secondary | ICD-10-CM | POA: Diagnosis not present

## 2020-09-01 MED ORDER — HYDROCOD POLST-CPM POLST ER 10-8 MG/5ML PO SUER: 5.0000 mL | Freq: Two times a day (BID) | ORAL | 0 refills | Status: DC | PRN

## 2020-09-01 MED ORDER — LOSARTAN POTASSIUM 25 MG PO TABS: 25.0000 mg | ORAL_TABLET | Freq: Every day | ORAL | 0 refills | Status: DC

## 2020-09-01 NOTE — Assessment & Plan Note (Addendum)
Chronic. Will stop Lisinopril and begin Losartan 25mg . Will refer to Pulmonology for further evaluation. Follow up in 2 weeks.  Tussinex sent to the pharmacy to help with patient's symptoms.

## 2020-09-01 NOTE — Progress Notes (Signed)
There were no vitals taken for this visit.   Subjective:    Patient ID: Sara Zhang, female    DOB: July 22, 1970, 50 y.o.   MRN: [id number]  HPI: Sara Zhang is a 50 y.o. female  Chief Complaint  Patient presents with   Cough    Patient states she has been having a cough for about 2 weeks, but it has gradually got worse since Tuesday. Patient states she is coughing up clear phlegm and states she is having deep cough, that she is having to wear a Depend Underwear. Patient states she has tried OTC cough medication Robitussin and it is not helping. Patient was prescribed Tessalon and it has not helped.    Patient states she has been having the cough for about 2 weeks. Patient states she tried the tessalon and OTC robitussin.  Patient states sometimes it is a dry cough. This has been going on off and on since she had COVID 1 year ago.  Patient was recently given tessalon which hasn't helped her.    Relevant past medical, surgical, family and social history reviewed and updated as indicated. Interim medical history since our last visit reviewed. Allergies and medications reviewed and updated.  Review of Systems  Respiratory:  Positive for cough.    Per HPI unless specifically indicated above     Objective:    There were no vitals taken for this visit.  Wt Readings from Last 3 Encounters:  08/15/20 (!) 342 lb 3.2 oz (155.2 kg)  06/23/20 (!) 344 lb 6.4 oz (156.2 kg)  06/05/20 (!) 347 lb 9.6 oz (157.7 kg)    Physical Exam Vitals and nursing note reviewed.  HENT:     Head: Normocephalic.     Right Ear: Hearing normal.     Left Ear: Hearing normal.     Nose: Nose normal.  Eyes:     Pupils: Pupils are equal, round, and reactive to light.  Pulmonary:     Effort: Pulmonary effort is normal. No respiratory distress.  Neurological:     Mental Status: She is alert.  Psychiatric:        Mood and Affect: Mood normal.        Behavior: Behavior normal.        Thought Content:  Thought content normal.        Judgment: Judgment normal.    Results for orders placed or performed in visit on 08/09/20  Sed Rate (ESR)  Result Value Ref Range   Sed Rate 43 (H) 0 - 40 mm/hr  C-reactive protein  Result Value Ref Range   CRP 4 0 - 10 mg/L  CBC with Differential/Platelet  Result Value Ref Range   WBC 13.6 (H) 3.4 - 10.8 x10E3/uL   RBC 4.83 3.77 - 5.28 x10E6/uL   Hemoglobin 12.6 11.1 - 15.9 g/dL   Hematocrit 40.9 34.0 - 46.6 %   MCV 85 79 - 97 fL   MCH 26.1 (L) 26.6 - 33.0 pg   MCHC 30.8 (L) 31.5 - 35.7 g/dL   RDW 13.4 11.7 - 15.4 %   Platelets 384 150 - 450 x10E3/uL   Neutrophils 53 Not Estab. %   Lymphs 35 Not Estab. %   Monocytes 9 Not Estab. %   Eos 2 Not Estab. %   Basos 0 Not Estab. %   Neutrophils Absolute 7.2 (H) 1.4 - 7.0 x10E3/uL   Lymphocytes Absolute 4.7 (H) 0.7 - 3.1 x10E3/uL   Monocytes Absolute 1.2 (H)  0.1 - 0.9 x10E3/uL   EOS (ABSOLUTE) 0.3 0.0 - 0.4 x10E3/uL   Basophils Absolute 0.1 0.0 - 0.2 x10E3/uL   Immature Granulocytes 1 Not Estab. %   Immature Grans (Abs) 0.1 0.0 - 0.1 x10E3/uL      Assessment & Plan:   Problem List Items Addressed This Visit       Other   Cough - Primary    Chronic. Will stop Lisinopril and begin Losartan 37m. Will refer to Pulmonology for further evaluation. Follow up in 2 weeks.  Tussinex sent to the pharmacy to help with patient's symptoms.       Relevant Orders   Ambulatory referral to Pulmonology     Follow up plan: Return in about 2 weeks (around 09/15/2020) for cough.    This visit was completed via MyChart due to the restrictions of the COVID-19 pandemic. All issues as above were discussed and addressed. Physical exam was done as above through visual confirmation on MyChart. If it was felt that the patient should be evaluated in the office, they were directed there. The patient verbally consented to this visit. Location of the patient: Home Location of the provider: Office Those involved with  this call:  Provider: KJon Billings NP CMA: DIrena Reichmann CMindenDesk/Registration: JJill SideThis encounter was conducted via video.  I spent 20 dedicated to the care of this patient on the date of this encounter to include previsit review of 30, face to face time with the patient, and post visit ordering of testing.

## 2020-09-05 ENCOUNTER — Telehealth: Payer: Self-pay

## 2020-09-05 NOTE — Chronic Care Management (AMB) (Signed)
Chronic Care Management Pharmacy Assistant   Name: Sara Zhang  MRN: [id number] DOB: [birth year]   Reason for Encounter: Disease State General    Recent office visits:  09/01/20-Sara Mathis Dad, NP (Video Visit) Seen for a cough. Will stop Lisinopril and begin Losartan 25m. Will refer to Pulmonology for further evaluation. Tussinex sent to the pharmacy to help with patient's symptoms. Follow up in 2 weeks.   08/15/20-Sara Zhang Card NP (PCP) Seen for Upper respiratory tract infection. Recommend use of Albuterol as needed.  Trial Claritin 10 MG daily and Flonase. Keep f/up appointment as scheduled 09/21/20. 08/01/20-Sara PAnnia Friendly DO (Video Visit) Seen for upper respiratory tract infection. Start on prednisone 10 mg, Tussionex 200 mg and tessalon 5 mLs by mouth every 12 (twelve) hours as needed. Follow up in 2 weeks. 06/23/20-Sara TLuvenia Heller NP (PCP) Seen for a cough. Follow up in 3 months. 06/05/20-Sara TLuvenia Heller NP (PCP) General follow up. Labs ordered. Start Flonase daily.  Take Claritin daily.  Take Diabetic Tussin as needed. Follow up in 1 week. Recent consult visits:  06/12/20-Sara SKristine Zhang(Endocrinology) Diabetic follow up. Follow up in 3 months. 04/20/20-Sara Zhang DPM (Podiatry) Seen for Ingrown Toenail. 04/03/20-Sara Zhang DPM (Podiatry) recommend she discontinue use of the Betadine and start with Epson salt warm water soaks cover during the day leave open at bedtime.  Also recommended Augmentin 875 mg take 1 tablet by mouth twice a day and will follow up with me in 2 weeks. 03/27/20-Sara ARichardson Dopp MD (Pulmonology) Obstructive Sleep Apnea Visit. 03/20/20-Sara Zhang DPM (Podiatry) Follow up visit. Start on Betadine Soak treatment. Follow up in 2 weeks.  Hospital visits:  None in previous 6 months  Medications: Outpatient Encounter Medications as of 09/05/2020  Medication Sig   albuterol (VENTOLIN HFA) 108 (90 Base) MCG/ACT inhaler INHALE 2 PUFFS INTO  THE LUNGS EVERY 6 HOURS AS NEEDED FOR WHEEZING OR SHORTNESS OF BREATH   amLODipine (NORVASC) 5 MG tablet TAKE 1 TABLET(5 MG) BY MOUTH DAILY   atenolol (TENORMIN) 50 MG tablet Take 1 tablet (50 mg total) by mouth daily.   atorvastatin (LIPITOR) 10 MG tablet TAKE 1 TABLET(10 MG) BY MOUTH DAILY   benzonatate (TESSALON) 200 MG capsule Take 1 capsule (200 mg total) by mouth 2 (two) times daily as needed for cough.   Blood Glucose Monitoring Suppl (CONTOUR NEXT MONITOR) w/Device KIT To check blood sugar 4 times daily.   Calcium Carbonate-Vitamin D (OYSTER SHELL CALCIUM 500 + D) 500-125 MG-UNIT TABS Take 1 tablet by mouth daily.    chlorpheniramine-HYDROcodone (TUSSIONEX PENNKINETIC ER) 10-8 MG/5ML SUER Take 5 mLs by mouth every 12 (twelve) hours as needed for cough.   Cholecalciferol (VITAMIN D3) 10 MCG (400 UNIT) tablet Take 400 Units by mouth daily.   Exenatide ER (BYDUREON BCISE) 2 MG/0.85ML AUIJ INJECT 2 MG UNDER THE SKIN EVERY WEEK   FLOWFLEX COVID-19 AG HOME TEST KIT TEST AS DIRECTED TODAY   fluticasone (FLONASE) 50 MCG/ACT nasal spray Place 2 sprays into both nostrils daily.   furosemide (LASIX) 40 MG tablet Take 1 tablet (40 mg total) by mouth 2 (two) times daily.   glucose blood (CONTOUR NEXT TEST) test strip TEST BLOOD SUGAR ONCE DAILY   Insulin Glargine (TOUJEO SOLOSTAR) 300 UNIT/ML SOPN Inject 54 Units into the skin at bedtime.   Insulin Pen Needle 32G X 4 MM MISC 1 Units by Does not apply route every morning. Pen needles   JARDIANCE 25 MG TABS tablet Take  1 tablet (25 mg total) by mouth daily.   Lancets (ONETOUCH ULTRASOFT) lancets Use as instructed   levonorgestrel (MIRENA) 20 MCG/24HR IUD 1 each by Intrauterine route once.    loratadine (CLARITIN) 10 MG tablet Take 1 tablet (10 mg total) by mouth daily.   losartan (COZAAR) 25 MG tablet Take 1 tablet (25 mg total) by mouth daily.   OXYGEN Inhale 2 L into the lungs daily.   No facility-administered encounter medications on file as of  09/05/2020.    I have unsuccessfully reached patient 3x to complete general assessment call. I have left 3 voicemail's for patient to return phone call as well.   Star Rating Drugs: Losartan 25 mg Last filled:09/01/20 90 DS Atorvastatin 10 mg Last filled:06/05/20 90 DS Jardiance 25 mg Last filled:06/29/20 90 DS  Sara Zhang, [place]

## 2020-09-20 ENCOUNTER — Ambulatory Visit: Payer: Medicare Other

## 2020-09-21 ENCOUNTER — Encounter: Payer: Self-pay | Admitting: Nurse Practitioner

## 2020-09-21 ENCOUNTER — Ambulatory Visit (INDEPENDENT_AMBULATORY_CARE_PROVIDER_SITE_OTHER): Payer: Medicare Other | Admitting: Nurse Practitioner

## 2020-09-21 ENCOUNTER — Other Ambulatory Visit: Payer: Self-pay

## 2020-09-21 VITALS — BP 139/77 | HR 83 | Temp 98.6°F | Wt 342.0 lb

## 2020-09-21 DIAGNOSIS — I428 Other cardiomyopathies: Secondary | ICD-10-CM | POA: Diagnosis not present

## 2020-09-21 DIAGNOSIS — I130 Hypertensive heart and chronic kidney disease with heart failure and stage 1 through stage 4 chronic kidney disease, or unspecified chronic kidney disease: Secondary | ICD-10-CM | POA: Diagnosis not present

## 2020-09-21 DIAGNOSIS — E1169 Type 2 diabetes mellitus with other specified complication: Secondary | ICD-10-CM

## 2020-09-21 DIAGNOSIS — R059 Cough, unspecified: Secondary | ICD-10-CM

## 2020-09-21 DIAGNOSIS — N1831 Chronic kidney disease, stage 3a: Secondary | ICD-10-CM | POA: Diagnosis not present

## 2020-09-21 DIAGNOSIS — Z794 Long term (current) use of insulin: Secondary | ICD-10-CM

## 2020-09-21 DIAGNOSIS — Z6841 Body Mass Index (BMI) 40.0 and over, adult: Secondary | ICD-10-CM

## 2020-09-21 DIAGNOSIS — I5032 Chronic diastolic (congestive) heart failure: Secondary | ICD-10-CM

## 2020-09-21 DIAGNOSIS — E119 Type 2 diabetes mellitus without complications: Secondary | ICD-10-CM | POA: Diagnosis not present

## 2020-09-21 DIAGNOSIS — N182 Chronic kidney disease, stage 2 (mild): Secondary | ICD-10-CM

## 2020-09-21 DIAGNOSIS — E785 Hyperlipidemia, unspecified: Secondary | ICD-10-CM | POA: Diagnosis not present

## 2020-09-21 LAB — BAYER DCA HB A1C WAIVED: HB A1C (BAYER DCA - WAIVED): 7.3 % — ABNORMAL HIGH (ref <7.0)

## 2020-09-21 NOTE — Assessment & Plan Note (Signed)
Chronic, ongoing.  Followed by endo.  A1C 7.3% today, mild upward trend due to dietary indiscretions.  Continue collaboration with endocrinology.  Continue current medication regimen and adjust as needed -- recommend she return to 56 units Toujeo and work on [employer].  Recommend she continue to monitor BS at home 2-3 times a day.  Refills as needed.  Return in 3 months.

## 2020-09-21 NOTE — Assessment & Plan Note (Signed)
BMI 50.50 with diabetes, HTN, HF.  Recommended eating smaller high protein, low fat meals more frequently and exercising 30 mins a day 5 times a week with a goal of 10-15lb weight loss in the next 3 months. Patient voiced their understanding and motivation to adhere to these recommendations.

## 2020-09-21 NOTE — Assessment & Plan Note (Signed)
Chronic, ongoing with BP at goal today.  Continue current medication regimen and collaboration with cardiology.  Recommend checking BP three mornings a week at home + documenting for providers.  CMP today.  Recommend DASH diet focus and avoidance of high sodium foods, praised for cutting back on pork skins.  Return in 3 months.

## 2020-09-21 NOTE — Assessment & Plan Note (Signed)
With recent treatment, reports feeling better, but has had ongoing nagging cough intermittently since Covid over a year ago.  Recent imaging negative for acute findings.  Recommend she switch over to Losartan as was recommended recent visit.  Continue Claritin 10 MG daily and Flonase, as suspect this is more allergy related.  Is scheduled to see pulmonary.  Will repeat ESR today as recent mild elevation and check CRP, CBC, ANA, and Alpha 1 testing.  Non smoker.

## 2020-09-21 NOTE — Assessment & Plan Note (Signed)
Chronic, ongoing with BP at goal.  Euvolemic today.  Continue current medication regimen and collaboration with cardiology.  Praised for cutting back on pork skins and recommend to continue this cut back.  Recommend: - Reminded to call for an overnight weight gain of >2 pounds or a weekly weight weight of >5 pounds - not adding salt to his food and has been reading food labels. Reviewed the importance of keeping daily sodium intake to 2000mg  daily -- NO PORK SKINS

## 2020-09-21 NOTE — Patient Instructions (Signed)
Diabetes Mellitus and Nutrition, Adult When you have diabetes, or diabetes mellitus, it is very important to have healthy eating habits because your blood sugar (glucose) levels are greatly affected by what you eat and drink. Eating healthy foods in the right amounts, at about the same times every day, can help you:  Control your blood glucose.  Lower your risk of heart disease.  Improve your blood pressure.  Reach or maintain a healthy weight. What can affect my meal plan? Every person with diabetes is different, and each person has different needs for a meal plan. Your health care provider may recommend that you work with a dietitian to make a meal plan that is best for you. Your meal plan may vary depending on factors such as:  The calories you need.  The medicines you take.  Your weight.  Your blood glucose, blood pressure, and cholesterol levels.  Your activity level.  Other health conditions you have, such as heart or kidney disease. How do carbohydrates affect me? Carbohydrates, also called carbs, affect your blood glucose level more than any other type of food. Eating carbs naturally raises the amount of glucose in your blood. Carb counting is a method for keeping track of how many carbs you eat. Counting carbs is important to keep your blood glucose at a healthy level, especially if you use insulin or take certain oral diabetes medicines. It is important to know how many carbs you can safely have in each meal. This is different for every person. Your dietitian can help you calculate how many carbs you should have at each meal and for each snack. How does alcohol affect me? Alcohol can cause a sudden decrease in blood glucose (hypoglycemia), especially if you use insulin or take certain oral diabetes medicines. Hypoglycemia can be a life-threatening condition. Symptoms of hypoglycemia, such as sleepiness, dizziness, and confusion, are similar to symptoms of having too much  alcohol.  Do not drink alcohol if: ? Your health care provider tells you not to drink. ? You are pregnant, may be pregnant, or are planning to become pregnant.  If you drink alcohol: ? Do not drink on an empty stomach. ? Limit how much you use to:  0-1 drink a day for women.  0-2 drinks a day for men. ? Be aware of how much alcohol is in your drink. In the [place] one drink equals one 12 oz bottle of beer (355 mL), one 5 oz glass of wine (148 mL), or one 1 oz glass of hard liquor (44 mL). ? Keep yourself hydrated with water, diet soda, or unsweetened iced tea.  Keep in mind that regular soda, juice, and other mixers may contain a lot of sugar and must be counted as carbs. What are tips for following this plan? Reading food labels  Start by checking the serving size on the "Nutrition Facts" label of packaged foods and drinks. The amount of calories, carbs, fats, and other nutrients listed on the label is based on one serving of the item. Many items contain more than one serving per package.  Check the total grams (g) of carbs in one serving. You can calculate the number of servings of carbs in one serving by dividing the total carbs by 15. For example, if a food has 30 g of total carbs per serving, it would be equal to 2 servings of carbs.  Check the number of grams (g) of saturated fats and trans fats in one serving. Choose foods that have   a low amount or none of these fats.  Check the number of milligrams (mg) of salt (sodium) in one serving. Most people should limit total sodium intake to less than 2,300 mg per day.  Always check the nutrition information of foods labeled as "low-fat" or "nonfat." These foods may be higher in added sugar or refined carbs and should be avoided.  Talk to your dietitian to identify your daily goals for nutrients listed on the label. Shopping  Avoid buying canned, pre-made, or processed foods. These foods tend to be high in fat, sodium, and added  sugar.  Shop around the outside edge of the grocery store. This is where you will most often find fresh fruits and vegetables, bulk grains, fresh meats, and fresh dairy. Cooking  Use low-heat cooking methods, such as baking, instead of high-heat cooking methods like deep frying.  Cook using healthy oils, such as olive, canola, or sunflower oil.  Avoid cooking with butter, cream, or high-fat meats. Meal planning  Eat meals and snacks regularly, preferably at the same times every day. Avoid going long periods of time without eating.  Eat foods that are high in fiber, such as fresh fruits, vegetables, beans, and whole grains. Talk with your dietitian about how many servings of carbs you can eat at each meal.  Eat 4-6 oz (112-168 g) of lean protein each day, such as lean meat, chicken, fish, eggs, or tofu. One ounce (oz) of lean protein is equal to: ? 1 oz (28 g) of meat, chicken, or fish. ? 1 egg. ?  cup (62 g) of tofu.  Eat some foods each day that contain healthy fats, such as avocado, nuts, seeds, and fish.   What foods should I eat? Fruits Berries. Apples. Oranges. Peaches. Apricots. Plums. Grapes. Mango. Papaya. Pomegranate. Kiwi. Cherries. Vegetables Lettuce. Spinach. Leafy greens, including kale, chard, collard greens, and mustard greens. Beets. Cauliflower. Cabbage. Broccoli. Carrots. Green beans. Tomatoes. Peppers. Onions. Cucumbers. Brussels sprouts. Grains Whole grains, such as whole-wheat or whole-grain bread, crackers, tortillas, cereal, and pasta. Unsweetened oatmeal. Quinoa. Brown or wild rice. Meats and other proteins Seafood. Poultry without skin. Lean cuts of poultry and beef. Tofu. Nuts. Seeds. Dairy Low-fat or fat-free dairy products such as milk, yogurt, and cheese. The items listed above may not be a complete list of foods and beverages you can eat. Contact a dietitian for more information. What foods should I avoid? Fruits Fruits canned with  syrup. Vegetables Canned vegetables. Frozen vegetables with butter or cream sauce. Grains Refined white flour and flour products such as bread, pasta, snack foods, and cereals. Avoid all processed foods. Meats and other proteins Fatty cuts of meat. Poultry with skin. Breaded or fried meats. Processed meat. Avoid saturated fats. Dairy Full-fat yogurt, cheese, or milk. Beverages Sweetened drinks, such as soda or iced tea. The items listed above may not be a complete list of foods and beverages you should avoid. Contact a dietitian for more information. Questions to ask a health care provider  Do I need to meet with a diabetes educator?  Do I need to meet with a dietitian?  What number can I call if I have questions?  When are the best times to check my blood glucose? Where to find more information:  American Diabetes Association: diabetes.org  Academy of Nutrition and Dietetics: www.eatright.org  National Institute of Diabetes and Digestive and Kidney Diseases: www.niddk.nih.gov  Association of Diabetes Care and Education Specialists: www.diabeteseducator.org Summary  It is important to have healthy eating   habits because your blood sugar (glucose) levels are greatly affected by what you eat and drink.  A healthy meal plan will help you control your blood glucose and maintain a healthy lifestyle.  Your health care provider may recommend that you work with a dietitian to make a meal plan that is best for you.  Keep in mind that carbohydrates (carbs) and alcohol have immediate effects on your blood glucose levels. It is important to count carbs and to use alcohol carefully. This information is not intended to replace advice given to you by your health care provider. Make sure you discuss any questions you have with your health care provider. Document Revised: 12/29/2018 Document Reviewed: 12/29/2018 Elsevier Patient Education  2021 Elsevier Inc.  

## 2020-09-21 NOTE — Assessment & Plan Note (Signed)
Refer to morbid obesity plan of care. 

## 2020-09-21 NOTE — Progress Notes (Signed)
BP 139/77   Pulse 83   Temp 98.6 F (37 C) (Oral)   Wt (!) 342 lb (155.1 kg)   SpO2 94%   BMI 50.50 kg/m    Subjective:    Patient ID: Sara Zhang, female    DOB: 1970/06/07, 50 y.o.   MRN: [id number]  HPI: Sara Zhang is a 50 y.o. female  Chief Complaint  Patient presents with   Cough    Patient states her cough is getting a little better, not all the way gone but has gotten better. Patient states it is just worse at night as it will give her a coughing spell and she still has phlegm she coughs up.    Medication Management    Patient states she was seen recently was seen here and was told to stop taking the Lisinopril as it may be causing the cold and was changed to Losartan and states she has not started the new prescription as she thinks the cough is from weather.   DIABETES Current medications include Jardiance 25 MG, Toujeo 54 units QHS, and Bydureon 2 MG weekly.  A1C in  May 6.6%. She is followed by endocrinology and her last visit with them was 06/12/20, no changes made on review of note -- she sees her again next month.  She does endorse poor diet over past few months and taking OTC cough medicine. Polydipsia/polyuria: no Visual disturbance: no Chest pain: no Paresthesias: no Glucose Monitoring: yes             Accucheck frequency: machine broke             Fasting glucose:              Evening:              Before meals: Taking Insulin?: yes             Long acting insulin: 54 units             Short acting insulin: Blood Pressure Monitoring: not checking Retinal Examination: Up to Date Foot Exam: Up to Date Pneumovax: Up to Date Influenza: Not Up to Date Aspirin: no    HYPERTENSION / HYPERLIPIDEMIA/HF Current medications include Losartan (taken off Lisinopril recently due to ongoing cough and changed to this), Furosemide, Amlodipine, Atenolol for HTN + Lipitor for HLD.    Reports cough is improving at this time, has not started Losartan but will.  Has  stayed on Lisinopril.  Cough has been intermittent issue ongoing since Covid pneumonia over a year ago and is noted more at night.  Followed by cardiology and last saw Dr. Saunders Revel 11/08/19, no changes made to follow-up in one year.  Last echo in 2020 October was 55-60%. Satisfied with current treatment? yes Duration of hypertension: chronic BP monitoring frequency: not checking BP range:  BP medication side effects: no Duration of hyperlipidemia: chronic Cholesterol medication side effects: no Cholesterol supplements: none Medication compliance: good compliance Aspirin: no Recent stressors: no Recurrent headaches: no Visual changes: no Palpitations: no Dyspnea: occasional, baseline at this time  Chest pain: no Lower extremity edema: occasional -- when eats salty foods Dizzy/lightheaded: no   Relevant past medical, surgical, family and social history reviewed and updated as indicated. Interim medical history since our last visit reviewed. Allergies and medications reviewed and updated.  Review of Systems  Constitutional:  Negative for activity change, appetite change, diaphoresis, fatigue and fever.  Respiratory:  Positive for shortness of breath (at  baseline, no worse). Negative for cough, chest tightness and wheezing.   Cardiovascular:  Positive for leg swelling (at baseline, no worse). Negative for chest pain and palpitations.  Gastrointestinal: Negative.   Endocrine: Negative for polydipsia, polyphagia and polyuria.  Neurological: Negative.   Psychiatric/Behavioral: Negative.     Per HPI unless specifically indicated above     Objective:    BP 139/77   Pulse 83   Temp 98.6 F (37 C) (Oral)   Wt (!) 342 lb (155.1 kg)   SpO2 94%   BMI 50.50 kg/m   Wt Readings from Last 3 Encounters:  09/21/20 (!) 342 lb (155.1 kg)  08/15/20 (!) 342 lb 3.2 oz (155.2 kg)  06/23/20 (!) 344 lb 6.4 oz (156.2 kg)    Physical Exam Vitals and nursing note reviewed.  Constitutional:       General: She is awake. She is not in acute distress.    Appearance: She is well-developed. She is morbidly obese. She is not ill-appearing.  HENT:     Head: Normocephalic.     Right Ear: Hearing normal.     Left Ear: Hearing normal.  Eyes:     General: Lids are normal.        Right eye: No discharge.        Left eye: No discharge.     Conjunctiva/sclera: Conjunctivae normal.     Pupils: Pupils are equal, round, and reactive to light.  Neck:     Thyroid: No thyromegaly.     Vascular: No carotid bruit.  Cardiovascular:     Rate and Rhythm: Normal rate and regular rhythm.     Heart sounds: Normal heart sounds. No murmur heard.   No gallop.  Pulmonary:     Effort: Pulmonary effort is normal. No accessory muscle usage or respiratory distress.     Breath sounds: Normal breath sounds.     Comments: Lungs clear throughout. Abdominal:     General: Bowel sounds are normal.     Palpations: Abdomen is soft.  Musculoskeletal:     Cervical back: Normal range of motion and neck supple.     Right lower leg: Edema (trace) present.     Left lower leg: Edema (trace) present.  Skin:    General: Skin is warm and dry.  Neurological:     Mental Status: She is alert and oriented to person, place, and time.  Psychiatric:        Attention and Perception: Attention normal.        Mood and Affect: Mood normal.        Speech: Speech normal.        Behavior: Behavior normal. Behavior is cooperative.        Thought Content: Thought content normal.   Results for orders placed or performed in visit on 09/21/20  Bayer DCA Hb A1c Waived  Result Value Ref Range   HB A1C (BAYER DCA - WAIVED) 7.3 (H) <7.0 %      Assessment & Plan:   Problem List Items Addressed This Visit       Cardiovascular and Mediastinum   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  Continue current medication regimen and collaboration  with cardiology.  Recommend checking BP three mornings a week at home + documenting for providers.  CMP today.  Recommend DASH diet focus and avoidance of high sodium foods, praised for  cutting back on pork skins.  Return in 3 months.      Relevant Orders   Bayer DCA Hb A1c Waived (Completed)   Chronic heart failure with preserved ejection fraction (HCC)    Chronic, ongoing with BP at goal.  Euvolemic today.  Continue current medication regimen and collaboration with cardiology.  Praised for cutting back on pork skins and recommend to continue this cut back.  Recommend: - Reminded to call for an overnight weight gain of >2 pounds or a weekly weight weight of >5 pounds - not adding salt to his food and has been reading food labels. Reviewed the importance of keeping daily sodium intake to <2042m daily -- NO PORK SKINS      NICM (nonischemic cardiomyopathy) (HCC)    Chronic, ongoing.  Continue collaboration with cardiology and current medication regimen as prescribed by them.        Endocrine   Hyperlipidemia associated with type 2 diabetes mellitus (HCC)    Chronic, ongoing.  Continue current medication regimen as is at goal.  Lipid panel today.      Relevant Orders   Comprehensive metabolic panel   Lipid Panel w/o Chol/HDL Ratio   Bayer DCA Hb A1c Waived (Completed)   Type 2 diabetes mellitus treated with insulin (HCC) - Primary    Chronic, ongoing.  Followed by endo.  A1C 7.3% today, mild upward trend due to dietary indiscretions.  Continue collaboration with endocrinology.  Continue current medication regimen and adjust as needed -- recommend she return to 56 units Toujeo and work on [employer]  Recommend she continue to monitor BS at home 2-3 times a day.  Refills as needed.  Return in 3 months.       Relevant Orders   CBC with Differential/Platelet   Bayer DCA Hb A1c Waived (Completed)     Genitourinary   CKD (chronic kidney disease) stage 3, GFR 30-59 ml/min (HCC)     Noted on May labs with GFR 56, will recheck CMP today and adjust regimen as needed.  Recommend continue ARB for kidney protection.  Consider nephrology referral in future if worsening.      Relevant Orders   Comprehensive metabolic panel     Other   Morbid obesity ([facility]    BMI 50.50 with diabetes, HTN, HF.  Recommended eating smaller high protein, low fat meals more frequently and exercising 30 mins a day 5 times a week with a goal of 10-15lb weight loss in the next 3 months. Patient voiced their understanding and motivation to adhere to these recommendations.       BMI 50.0-59.9, adult ([facility]    Refer to morbid obesity plan of care.      Cough    With recent treatment, reports feeling better, but has had ongoing nagging cough intermittently since Covid over a year ago.  Recent imaging negative for acute findings.  Recommend she switch over to Losartan as was recommended recent visit.  Continue Claritin 10 MG daily and Flonase, as suspect this is more allergy related.  Is scheduled to see pulmonary.  Will repeat ESR today as recent mild elevation and check CRP, CBC, ANA, and Alpha 1 testing.  Non smoker.      Relevant Orders   Sed Rate (ESR)   C-reactive protein   ANA w/Reflex if Positive   Alpha-1-Antitrypsin Deficiency     Follow up plan: Return in about 3 months (around 12/22/2020) for T2DM, HTN/HLD, COUGH, HF, CKD.

## 2020-09-21 NOTE — Assessment & Plan Note (Signed)
Chronic, ongoing.  Continue collaboration with cardiology and current medication regimen as prescribed by them.   

## 2020-09-21 NOTE — Assessment & Plan Note (Signed)
Noted on May labs with GFR 56, will recheck CMP today and adjust regimen as needed.  Recommend continue ARB for kidney protection.  Consider nephrology referral in future if worsening.

## 2020-09-21 NOTE — Assessment & Plan Note (Signed)
Chronic, ongoing.  Continue current medication regimen as is at goal.  Lipid panel today. 

## 2020-09-22 ENCOUNTER — Other Ambulatory Visit: Payer: Self-pay | Admitting: Internal Medicine

## 2020-09-22 ENCOUNTER — Ambulatory Visit (INDEPENDENT_AMBULATORY_CARE_PROVIDER_SITE_OTHER): Payer: Medicare Other

## 2020-09-22 ENCOUNTER — Other Ambulatory Visit: Payer: Self-pay

## 2020-09-22 VITALS — Ht 69.0 in | Wt 342.0 lb

## 2020-09-22 DIAGNOSIS — Z0000 Encounter for general adult medical examination without abnormal findings: Secondary | ICD-10-CM | POA: Diagnosis not present

## 2020-09-22 MED ORDER — AMLODIPINE BESYLATE 5 MG PO TABS: 5.0000 mg | ORAL_TABLET | Freq: Every day | ORAL | 0 refills | Status: DC

## 2020-09-22 NOTE — Patient Instructions (Signed)
Sara Zhang , Thank you for taking time to come for your Medicare Wellness Visit. I appreciate your ongoing commitment to your health goals. Please review the following plan we discussed and let me know if I can assist you in the future.   Screening recommendations/referrals: Colonoscopy: cologuard 03/26/2020, due 03/27/2023 Mammogram: completed 01/26/2020 Bone Density: n/a Recommended yearly ophthalmology/optometry visit for glaucoma screening and checkup Recommended yearly dental visit for hygiene and checkup  Vaccinations: Influenza vaccine: decline Pneumococcal vaccine: completed 04/18/2009 Tdap vaccine: due Shingles vaccine: discussed  Covid-19:  10/06/2019, 09/09/2019  Advanced directives: Advance directive discussed with you today.   Conditions/risks identified: none  Next appointment: Follow up in one year for your annual wellness visit.   Preventive Care 40-64 Years, Female Preventive care refers to lifestyle choices and visits with your health care provider that can promote health and wellness. What does preventive care include? A yearly physical exam. This is also called an annual well check. Dental exams once or twice a year. Routine eye exams. Ask your health care provider how often you should have your eyes checked. Personal lifestyle choices, including: Daily care of your teeth and gums. Regular physical activity. Eating a healthy diet. Avoiding tobacco and drug use. Limiting alcohol use. Practicing safe sex. Taking low-dose aspirin daily starting at age 15. Taking vitamin and mineral supplements as recommended by your health care provider. What happens during an annual well check? The services and screenings done by your health care provider during your annual well check will depend on your age, overall health, lifestyle risk factors, and family history of disease. Counseling  Your health care provider may ask you questions about your: Alcohol use. Tobacco  use. Drug use. Emotional well-being. Home and relationship well-being. Sexual activity. Eating habits. Work and work Statistician. Method of birth control. Menstrual cycle. Pregnancy history. Screening  You may have the following tests or measurements: Height, weight, and BMI. Blood pressure. Lipid and cholesterol levels. These may be checked every 5 years, or more frequently if you are over 58 years old. Skin check. Lung cancer screening. You may have this screening every year starting at age 75 if you have a 30-pack-year history of smoking and currently smoke or have quit within the past 15 years. Fecal occult blood test (FOBT) of the stool. You may have this test every year starting at age 80. Flexible sigmoidoscopy or colonoscopy. You may have a sigmoidoscopy every 5 years or a colonoscopy every 10 years starting at age 95. Hepatitis C blood test. Hepatitis B blood test. Sexually transmitted disease (STD) testing. Diabetes screening. This is done by checking your blood sugar (glucose) after you have not eaten for a while (fasting). You may have this done every 1-3 years. Mammogram. This may be done every 1-2 years. Talk to your health care provider about when you should start having regular mammograms. This may depend on whether you have a family history of breast cancer. BRCA-related cancer screening. This may be done if you have a family history of breast, ovarian, tubal, or peritoneal cancers. Pelvic exam and Pap test. This may be done every 3 years starting at age 88. Starting at age 25, this may be done every 5 years if you have a Pap test in combination with an HPV test. Bone density scan. This is done to screen for osteoporosis. You may have this scan if you are at high risk for osteoporosis. Discuss your test results, treatment options, and if necessary, the need for more tests  with your health care provider. Vaccines  Your health care provider may recommend certain vaccines,  such as: Influenza vaccine. This is recommended every year. Tetanus, diphtheria, and acellular pertussis (Tdap, Td) vaccine. You may need a Td booster every 10 years. Zoster vaccine. You may need this after age 69. Pneumococcal 13-valent conjugate (PCV13) vaccine. You may need this if you have certain conditions and were not previously vaccinated. Pneumococcal polysaccharide (PPSV23) vaccine. You may need one or two doses if you smoke cigarettes or if you have certain conditions. Talk to your health care provider about which screenings and vaccines you need and how often you need them. This information is not intended to replace advice given to you by your health care provider. Make sure you discuss any questions you have with your health care provider. Document Released: 02/17/2015 Document Revised: 10/11/2015 Document Reviewed: 11/22/2014 Elsevier Interactive Patient Education  2017 [place] Prevention in the Home Falls can cause injuries. They can happen to people of all ages. There are many things you can do to make your home safe and to help prevent falls. What can I do on the outside of my home? Regularly fix the edges of walkways and driveways and fix any cracks. Remove anything that might make you trip as you walk through a door, such as a raised step or threshold. Trim any bushes or trees on the path to your home. Use bright outdoor lighting. Clear any walking paths of anything that might make someone trip, such as rocks or tools. Regularly check to see if handrails are loose or broken. Make sure that both sides of any steps have handrails. Any raised decks and porches should have guardrails on the edges. Have any leaves, snow, or ice cleared regularly. Use sand or salt on walking paths during winter. Clean up any spills in your garage right away. This includes oil or grease spills. What can I do in the bathroom? Use night lights. Install grab bars by the toilet and  in the tub and shower. Do not use towel bars as grab bars. Use non-skid mats or decals in the tub or shower. If you need to sit down in the shower, use a plastic, non-slip stool. Keep the floor dry. Clean up any water that spills on the floor as soon as it happens. Remove soap buildup in the tub or shower regularly. Attach bath mats securely with double-sided non-slip rug tape. Do not have throw rugs and other things on the floor that can make you trip. What can I do in the bedroom? Use night lights. Make sure that you have a light by your bed that is easy to reach. Do not use any sheets or blankets that are too big for your bed. They should not hang down onto the floor. Have a firm chair that has side arms. You can use this for support while you get dressed. Do not have throw rugs and other things on the floor that can make you trip. What can I do in the kitchen? Clean up any spills right away. Avoid walking on wet floors. Keep items that you use a lot in easy-to-reach places. If you need to reach something above you, use a strong step stool that has a grab bar. Keep electrical cords out of the way. Do not use floor polish or wax that makes floors slippery. If you must use wax, use non-skid floor wax. Do not have throw rugs and other things on the  floor that can make you trip. What can I do with my stairs? Do not leave any items on the stairs. Make sure that there are handrails on both sides of the stairs and use them. Fix handrails that are broken or loose. Make sure that handrails are as long as the stairways. Check any carpeting to make sure that it is firmly attached to the stairs. Fix any carpet that is loose or worn. Avoid having throw rugs at the top or bottom of the stairs. If you do have throw rugs, attach them to the floor with carpet tape. Make sure that you have a light switch at the top of the stairs and the bottom of the stairs. If you do not have them, ask someone to add  them for you. What else can I do to help prevent falls? Wear shoes that: Do not have high heels. Have rubber bottoms. Are comfortable and fit you well. Are closed at the toe. Do not wear sandals. If you use a stepladder: Make sure that it is fully opened. Do not climb a closed stepladder. Make sure that both sides of the stepladder are locked into place. Ask someone to hold it for you, if possible. Clearly mark and make sure that you can see: Any grab bars or handrails. First and last steps. Where the edge of each step is. Use tools that help you move around (mobility aids) if they are needed. These include: Canes. Walkers. Scooters. Crutches. Turn on the lights when you go into a dark area. Replace any light bulbs as soon as they burn out. Set up your furniture so you have a clear path. Avoid moving your furniture around. If any of your floors are uneven, fix them. If there are any pets around you, be aware of where they are. Review your medicines with your doctor. Some medicines can make you feel dizzy. This can increase your chance of falling. Ask your doctor what other things that you can do to help prevent falls. This information is not intended to replace advice given to you by your health care provider. Make sure you discuss any questions you have with your health care provider. Document Released: 11/17/2008 Document Revised: 06/29/2015 Document Reviewed: 02/25/2014 Elsevier Interactive Patient Education  2017 [employer].

## 2020-09-22 NOTE — Progress Notes (Signed)
Contacted via MyChart   Good evening Lashawnta, most of labs have returned except one that I ordered to look at your cough.  Overall labs remain stable.  Calcium remains on lower side, I would recommend taking calcium supplement daily and we will recheck next visit.  ANA, looking at autoimmune issues, is negative.  ESR is normal, but CRP mild elevation this check.  Will continue to monitor.  Cholesterol levels stable.  Any questions? Keep being awesome!!  Thank you for allowing me to participate in your care.  I appreciate you. Kindest regards, Tata Timmins

## 2020-09-22 NOTE — Progress Notes (Signed)
I connected with Sara Zhang today by telephone and verified that I am speaking with the correct person using two identifiers. Location patient: home Location provider: work Persons participating in the virtual visit: Sara Zhang, Glenna Durand LPN.   I discussed the limitations, risks, security and privacy concerns of performing an evaluation and management service by telephone and the availability of in person appointments. I also discussed with the patient that there may be a patient responsible charge related to this service. The patient expressed understanding and verbally consented to this telephonic visit.    Interactive audio and video telecommunications were attempted between this provider and patient, however failed, due to patient having technical difficulties OR patient did not have access to video capability.  We continued and completed visit with audio only.     Vital signs may be patient reported or missing.  Subjective:   Sara Zhang is a 50 y.o. female who presents for Medicare Annual (Subsequent) preventive examination.  Review of Systems     Cardiac Risk Factors include: diabetes mellitus;dyslipidemia;hypertension;obesity (BMI >30kg/m2);sedentary lifestyle     Objective:    Today's Vitals   09/22/20 0811  Weight: (!) 342 lb (155.1 kg)  Height: 5' 9"  (1.753 m)   Body mass index is 50.5 kg/m.  Advanced Directives 09/22/2020 02/04/2020 09/20/2019 02/03/2019 02/02/2019 08/19/2018 08/13/2017  Does Patient Have a Medical Advance Directive? No No No No No No No  Would patient like information on creating a medical advance directive? - No - Patient declined - No - Patient declined - - Yes (MAU/Ambulatory/Procedural Areas - Information given)    Current Medications (verified) Outpatient Encounter Medications as of 09/22/2020  Medication Sig   albuterol (VENTOLIN HFA) 108 (90 Base) MCG/ACT inhaler INHALE 2 PUFFS INTO THE LUNGS EVERY 6 HOURS AS NEEDED FOR WHEEZING  OR SHORTNESS OF BREATH   amLODipine (NORVASC) 5 MG tablet TAKE 1 TABLET(5 MG) BY MOUTH DAILY   atenolol (TENORMIN) 50 MG tablet Take 1 tablet (50 mg total) by mouth daily.   atorvastatin (LIPITOR) 10 MG tablet TAKE 1 TABLET(10 MG) BY MOUTH DAILY   benzonatate (TESSALON) 200 MG capsule Take 1 capsule (200 mg total) by mouth 2 (two) times daily as needed for cough.   Blood Glucose Monitoring Suppl (CONTOUR NEXT MONITOR) w/Device KIT To check blood sugar 4 times daily.   Calcium Carbonate-Vitamin D (OYSTER SHELL CALCIUM 500 + D) 500-125 MG-UNIT TABS Take 1 tablet by mouth daily.    chlorpheniramine-HYDROcodone (TUSSIONEX PENNKINETIC ER) 10-8 MG/5ML SUER Take 5 mLs by mouth every 12 (twelve) hours as needed for cough.   Cholecalciferol (VITAMIN D3) 10 MCG (400 UNIT) tablet Take 400 Units by mouth daily.   Exenatide ER (BYDUREON BCISE) 2 MG/0.85ML AUIJ INJECT 2 MG UNDER THE SKIN EVERY WEEK   FLOWFLEX COVID-19 AG HOME TEST KIT TEST AS DIRECTED TODAY   fluticasone (FLONASE) 50 MCG/ACT nasal spray Place 2 sprays into both nostrils daily.   furosemide (LASIX) 40 MG tablet Take 1 tablet (40 mg total) by mouth 2 (two) times daily.   glucose blood (CONTOUR NEXT TEST) test strip TEST BLOOD SUGAR ONCE DAILY   Insulin Glargine (TOUJEO SOLOSTAR) 300 UNIT/ML SOPN Inject 54 Units into the skin at bedtime.   Insulin Pen Needle 32G X 4 MM MISC 1 Units by Does not apply route every morning. Pen needles   JARDIANCE 25 MG TABS tablet Take 1 tablet (25 mg total) by mouth daily.   Lancets (ONETOUCH ULTRASOFT) lancets Use as instructed  levonorgestrel (MIRENA) 20 MCG/24HR IUD 1 each by Intrauterine route once.    loratadine (CLARITIN) 10 MG tablet Take 1 tablet (10 mg total) by mouth daily.   losartan (COZAAR) 25 MG tablet Take 1 tablet (25 mg total) by mouth daily.   OXYGEN Inhale 2 L into the lungs daily.   No facility-administered encounter medications on file as of 09/22/2020.    Allergies (verified) Patient  has no known allergies.   History: Past Medical History:  Diagnosis Date   Abnormal uterine bleeding (AUB) 11/25/2016   Chronic diastolic CHF (congestive heart failure) ([place])    a. 02/2012 Echo: EF 25-35%, glob HK; b. 06/2016 Echo: EF 55-60%, mild MR, nl PASP.   CKD (chronic kidney disease), stage II    COVID-19    Diabetes mellitus without complication ([place])    Extreme obesity    Hyperlipidemia    Hypertension    Hypoxia    Microcytic anemia    Pneumonia    Pneumonia    Sleep apnea    Status post endometrial ablation 06/20/2016   2015 hysteroscopy/D&C with NovaSure endometrial ablation; pathology-benign endometrial polyps   Thickened endometrium 06/20/2016   Ultrasound-22.3 mm endometrial stripe, heterogenous   Vaginal bleeding    a. 01/2017 s/p hyteroscopy and polypectomy [facility]).   Vitamin D deficiency    Past Surgical History:  Procedure Laterality Date   CESAREAN SECTION     CHOLECYSTECTOMY     DILATION AND CURETTAGE, DIAGNOSTIC / THERAPEUTIC     TONSILLECTOMY AND ADENOIDECTOMY     TOOTH EXTRACTION  01/2016   Family History  Problem Relation Age of Onset   Diabetes Mother    Hyperlipidemia Mother    Hypertension Mother    Stroke Mother    Arthritis Father    Asthma Father    Diabetes Father    Hypertension Father    Hypertension Brother    Diabetes Brother    Diabetes Maternal Grandmother    Hypertension Maternal Grandmother    Diabetes Maternal Grandfather    Hypertension Maternal Grandfather    Diabetes Paternal Grandmother    Hypertension Paternal Grandmother    Heart attack Maternal Uncle    Breast cancer Neg Hx    Social History   Socioeconomic History   Marital status: Single    Spouse name: Not on file   Number of children: Not on file   Years of education: GED   Highest education level: GED or equivalent  Occupational History   Occupation: disability  Tobacco Use   Smoking status: Never   Smokeless tobacco: Never  Vaping Use   Vaping Use:  Never used  Substance and Sexual Activity   Alcohol use: Yes    Alcohol/week: 0.0 standard drinks    Comment: 1-2 beers/month   Drug use: No   Sexual activity: Yes    Birth control/protection: None  Other Topics Concern   Not on file  Social History Narrative   The patient's son lives with her.  The patient recently lost an aunt in December 2020 and feels she got C19 related to the amount of family and friends coming in and out to pay respects to her aunt.    Social Determinants of Health   Financial Resource Strain: Low Risk    Difficulty of Paying Living Expenses: Not hard at all  Food Insecurity: No Food Insecurity   Worried About Charity fundraiser in the Last Year: Never true   [place] in the Last  Year: Never true  Transportation Needs: No Transportation Needs   Lack of Transportation (Medical): No   Lack of Transportation (Non-Medical): No  Physical Activity: Inactive   Days of Exercise per Week: 0 days   Minutes of Exercise per Session: 0 min  Stress: No Stress Concern Present   Feeling of Stress : Not at all  Social Connections: Not on file    Tobacco Counseling Counseling given: Not Answered   Clinical Intake:  Pre-visit preparation completed: Yes  Pain : No/denies pain     Nutritional Status: BMI > 30  Obese Nutritional Risks: None Diabetes: Yes  How often do you need to have someone help you when you read instructions, pamphlets, or other written materials from your doctor or pharmacy?: 1 - Never What is the last grade level you completed in school?: 11th grade  Diabetic? Yes Nutrition Risk Assessment:  Has the patient had any N/V/D within the last 2 months?  No  Does the patient have any non-healing wounds?  No  Has the patient had any unintentional weight loss or weight gain?  No   Diabetes:  Is the patient diabetic?  Yes  If diabetic, was a CBG obtained today?  No  Did the patient bring in their glucometer from home?  No  How often  do you monitor your CBG's? Twice daily.   Financial Strains and Diabetes Management:  Are you having any financial strains with the device, your supplies or your medication? No .  Does the patient want to be seen by Chronic Care Management for management of their diabetes?  No  Would the patient like to be referred to a Nutritionist or for Diabetic Management?  No   Diabetic Exams:  Diabetic Eye Exam: Completed 11/25/2019 Diabetic Foot Exam: Completed 12/22/2019   Interpreter Needed?: No  Information entered by :: NAllen LPN   Activities of Daily Living In your present state of health, do you have any difficulty performing the following activities: 09/22/2020  Hearing? N  Vision? N  Difficulty concentrating or making decisions? Y  Walking or climbing stairs? Y  Dressing or bathing? N  Doing errands, shopping? N  Preparing Food and eating ? N  Using the Toilet? N  In the past six months, have you accidently leaked urine? N  Do you have problems with loss of bowel control? N  Managing your Medications? N  Managing your Finances? N  Housekeeping or managing your Housekeeping? N  Some recent data might be hidden    Patient Care Team: Venita Lick, NP as PCP - General (Nurse Practitioner) End, Harrell Gave, MD as PCP - Cardiology (Cardiology) Sharlotte Alamo, DPM (Podiatry) Defrancesco, Alanda Slim, MD as Consulting Physician (Obstetrics and Gynecology) Leandrew Koyanagi, MD as Referring Physician (Ophthalmology) Vanita Ingles, RN as Case Manager (General Practice)  Indicate any recent Medical Services you may have received from other than [facility] providers in the past year (date may be approximate).     Assessment:   This is a routine wellness examination for [place].  Hearing/Vision screen Vision Screening - Comments:: Regular eye exams, [facility]  Dietary issues and exercise activities discussed: Current Exercise Habits: The patient does not participate in  regular exercise at present   Goals Addressed             This Visit's Progress    Patient Stated       09/22/2020, stay well       Depression Screen PHQ 2/9 Scores 09/22/2020  08/01/2020 03/08/2020 02/09/2020 09/20/2019 08/19/2018 08/13/2017  PHQ - 2 Score 0 0 0 0 0 0 2  PHQ- 9 Score - - - - 0 - 5    Fall Risk Fall Risk  09/22/2020 08/01/2020 03/08/2020 02/09/2020 09/20/2019  Falls in the past year? 1 0 0 0 0  Comment tripped - - - -  Number falls in past yr: 0 0 - - -  Injury with Fall? 0 0 - - -  Risk for fall due to : Medication side effect No Fall Risks - - -  Follow up Falls evaluation completed;Education provided;Falls prevention discussed Falls evaluation completed - - Falls evaluation completed;Education provided    FALL RISK PREVENTION PERTAINING TO THE HOME:  Any stairs in or around the home? Yes  If so, are there any without handrails? No  Home free of loose throw rugs in walkways, pet beds, electrical cords, etc? Yes  Adequate lighting in your home to reduce risk of falls? Yes   ASSISTIVE DEVICES UTILIZED TO PREVENT FALLS:  Life alert? No  Use of a cane, walker or w/c? No  Grab bars in the bathroom? No  Shower chair or bench in shower? No  Elevated toilet seat or a handicapped toilet? Yes   TIMED UP AND GO:  Was the test performed? No .      Cognitive Function:     6CIT Screen 09/22/2020 09/20/2019 08/13/2017 08/08/2016  What Year? 0 points 0 points 0 points 0 points  What month? 0 points 0 points 0 points 0 points  What time? 0 points 0 points 0 points 0 points  Count back from 20 0 points 0 points 0 points 0 points  Months in reverse 0 points 0 points 0 points 0 points  Repeat phrase 0 points 0 points 0 points 0 points  Total Score 0 0 0 0    Immunizations Immunization History  Administered Date(s) Administered   PFIZER(Purple Top)SARS-COV-2 Vaccination 09/09/2019, 10/06/2019   Pneumococcal Polysaccharide-23 01/12/2007, 04/18/2009   Tdap 07/07/2007     TDAP status: Due, Education has been provided regarding the importance of this vaccine. Advised may receive this vaccine at local pharmacy or Health Dept. Aware to provide a copy of the vaccination record if obtained from local pharmacy or Health Dept. Verbalized acceptance and understanding.  Flu Vaccine status: Declined, Education has been provided regarding the importance of this vaccine but patient still declined. Advised may receive this vaccine at local pharmacy or Health Dept. Aware to provide a copy of the vaccination record if obtained from local pharmacy or Health Dept. Verbalized acceptance and understanding.  Pneumococcal vaccine status: Up to date  Covid-19 vaccine status: Completed vaccines  Qualifies for Shingles Vaccine? Yes   Zostavax completed No   Shingrix Completed?: No.    Education has been provided regarding the importance of this vaccine. Patient has been advised to call insurance company to determine out of pocket expense if they have not yet received this vaccine. Advised may also receive vaccine at local pharmacy or Health Dept. Verbalized acceptance and understanding.  Screening Tests Health Maintenance  Topic Date Due   INFLUENZA VACCINE  09/04/2020   COVID-19 Vaccine (3 - Pfizer risk series) 10/07/2020 (Originally 11/03/2019)   Zoster Vaccines- Shingrix (1 of 2) 11/01/2020 (Originally 12/13/1987)   TETANUS/TDAP  06/05/2021 (Originally 07/06/2017)   Pneumococcal Vaccine 71-57 Years old (3 - PCV) 08/01/2021 (Originally 04/19/2010)   OPHTHALMOLOGY EXAM  11/24/2020   FOOT EXAM  12/21/2020   HEMOGLOBIN A1C  03/24/2021   PAP SMEAR-Modifier  01/20/2022   MAMMOGRAM  01/25/2022   Fecal DNA (Cologuard)  03/27/2023   PNEUMOCOCCAL POLYSACCHARIDE VACCINE AGE 75-64 HIGH RISK  Completed   Hepatitis C Screening  Completed   HIV Screening  Completed   HPV VACCINES  Aged Out    Health Maintenance  Health Maintenance Due  Topic Date Due   INFLUENZA VACCINE  09/04/2020     Colorectal cancer screening: Type of screening: Cologuard. Completed 03/26/2020. Repeat every 3 years  Mammogram status: Completed /22/2021. Repeat every year  Bone Density status: n/a  Lung Cancer Screening: (Low Dose CT Chest recommended if Age 7-80 years, 30 pack-year currently smoking OR have quit w/in 15years.) does not qualify.   Lung Cancer Screening Referral: no  Additional Screening:  Hepatitis C Screening: does qualify; Completed 06/05/2020  Vision Screening: Recommended annual ophthalmology exams for early detection of glaucoma and other disorders of the eye. Is the patient up to date with their annual eye exam?  Yes  Who is the provider or what is the name of the office in which the patient attends annual eye exams? [facility] If pt is not established with a provider, would they like to be referred to a provider to establish care? No .   Dental Screening: Recommended annual dental exams for proper oral hygiene  Community Resource Referral / Chronic Care Management: CRR required this visit?  No   CCM required this visit?  No      Plan:     I have personally reviewed and noted the following in the patient's chart:   Medical and social history Use of alcohol, tobacco or illicit drugs  Current medications and supplements including opioid prescriptions.  Functional ability and status Nutritional status Physical activity Advanced directives List of other physicians Hospitalizations, surgeries, and ER visits in previous 12 months Vitals Screenings to include cognitive, depression, and falls Referrals and appointments  In addition, I have reviewed and discussed with patient certain preventive protocols, quality metrics, and best practice recommendations. A written personalized care plan for preventive services as well as general preventive health recommendations were provided to patient.     Kellie Simmering, LPN   [id number]/[id number]   Nurse Notes:

## 2020-10-03 LAB — COMPREHENSIVE METABOLIC PANEL
ALT: 12 IU/L (ref 0–32)
AST: 13 IU/L (ref 0–40)
Albumin/Globulin Ratio: 1.3 (ref 1.2–2.2)
Albumin: 3.7 g/dL — ABNORMAL LOW (ref 3.8–4.9)
Alkaline Phosphatase: 106 IU/L (ref 44–121)
BUN/Creatinine Ratio: 10 (ref 9–23)
BUN: 10 mg/dL (ref 6–24)
Bilirubin Total: 0.2 mg/dL (ref 0.0–1.2)
CO2: 25 mmol/L (ref 20–29)
Calcium: 8.2 mg/dL — ABNORMAL LOW (ref 8.7–10.2)
Chloride: 100 mmol/L (ref 96–106)
Creatinine, Ser: 0.97 mg/dL (ref 0.57–1.00)
Globulin, Total: 2.9 g/dL (ref 1.5–4.5)
Glucose: 150 mg/dL — ABNORMAL HIGH (ref 65–99)
Potassium: 5.1 mmol/L (ref 3.5–5.2)
Sodium: 140 mmol/L (ref 134–144)
Total Protein: 6.6 g/dL (ref 6.0–8.5)
eGFR: 71 mL/min/{1.73_m2} (ref >59)

## 2020-10-03 LAB — CBC WITH DIFFERENTIAL/PLATELET
Basophils Absolute: 0.1 10*3/uL (ref 0.0–0.2)
Basos: 1 %
EOS (ABSOLUTE): 0.4 10*3/uL (ref 0.0–0.4)
Eos: 5 %
Hematocrit: 39.3 % (ref 34.0–46.6)
Hemoglobin: 12 g/dL (ref 11.1–15.9)
Immature Grans (Abs): 0.1 10*3/uL (ref 0.0–0.1)
Immature Granulocytes: 1 %
Lymphocytes Absolute: 2.4 10*3/uL (ref 0.7–3.1)
Lymphs: 28 %
MCH: 26.1 pg — ABNORMAL LOW (ref 26.6–33.0)
MCHC: 30.5 g/dL — ABNORMAL LOW (ref 31.5–35.7)
MCV: 86 fL (ref 79–97)
Monocytes Absolute: 0.7 10*3/uL (ref 0.1–0.9)
Monocytes: 8 %
Neutrophils Absolute: 4.9 10*3/uL (ref 1.4–7.0)
Neutrophils: 57 %
Platelets: 413 10*3/uL (ref 150–450)
RBC: 4.59 x10E6/uL (ref 3.77–5.28)
RDW: 14.2 % (ref 11.7–15.4)
WBC: 8.5 10*3/uL (ref 3.4–10.8)

## 2020-10-03 LAB — ALPHA-1-ANTITRYPSIN DEFICIENCY

## 2020-10-03 LAB — ANA W/REFLEX IF POSITIVE: Anti Nuclear Antibody (ANA): NEGATIVE

## 2020-10-03 LAB — LIPID PANEL W/O CHOL/HDL RATIO
Cholesterol, Total: 129 mg/dL (ref 100–199)
HDL: 43 mg/dL (ref >39)
LDL Chol Calc (NIH): 73 mg/dL (ref 0–99)
Triglycerides: 60 mg/dL (ref 0–149)
VLDL Cholesterol Cal: 13 mg/dL (ref 5–40)

## 2020-10-03 LAB — SEDIMENTATION RATE: Sed Rate: 38 mm/hr (ref 0–40)

## 2020-10-03 LAB — C-REACTIVE PROTEIN: CRP: 18 mg/L — ABNORMAL HIGH (ref 0–10)

## 2020-10-13 ENCOUNTER — Other Ambulatory Visit: Payer: Self-pay

## 2020-10-13 ENCOUNTER — Ambulatory Visit (INDEPENDENT_AMBULATORY_CARE_PROVIDER_SITE_OTHER): Payer: Medicare Other | Admitting: Internal Medicine

## 2020-10-13 ENCOUNTER — Encounter: Payer: Self-pay | Admitting: Internal Medicine

## 2020-10-13 DIAGNOSIS — J45991 Cough variant asthma: Secondary | ICD-10-CM | POA: Diagnosis not present

## 2020-10-13 MED ORDER — FAMOTIDINE 20 MG PO TABS: ORAL_TABLET | ORAL | 11 refills | Status: DC

## 2020-10-13 MED ORDER — PREDNISONE 10 MG PO TABS: ORAL_TABLET | ORAL | 0 refills | Status: DC

## 2020-10-13 MED ORDER — PANTOPRAZOLE SODIUM 40 MG PO TBEC: 40.0000 mg | DELAYED_RELEASE_TABLET | Freq: Every day | ORAL | 2 refills | Status: DC

## 2020-10-13 NOTE — Patient Instructions (Signed)
Prednisone 10 mg take  4 each am x 2 days,   2 each am x 2 days,  1 each am x 2 days and stop   Pantoprazole (protonix) 40 mg   Take  30-60 min before first meal of the day and Pepcid (famotidine)  20 mg after supper until return to office - this is the best way to tell whether stomach acid is contributing to your problem.   If not better at bedtime >> add CHLORPHENIRAMINE  4 mg  (Chlortab 4mg   at should be easiest to find in the green box)  take one every 4 hours as needed - available over the counter- may cause drowsiness so start with just a dose or two an hour before bedtime    GERD (REFLUX)  is an extremely common cause of respiratory symptoms just like yours , many times with no obvious heartburn at all.    It can be treated with medication, but also with lifestyle changes including elevation of the head of your bed (ideally with 6 -8inch blocks under the headboard of your bed),  Smoking cessation, avoidance of late meals, excessive alcohol, and avoid fatty foods, chocolate, peppermint, colas, red wine, and acidic juices such as orange juice.  NO MINT OR MENTHOL PRODUCTS SO NO COUGH DROPS  USE SUGARLESS CANDY INSTEAD (Jolley ranchers or Stover's or Life Savers) or even ice chips will also do - the key is to swallow to prevent all throat clearing. NO OIL BASED VITAMINS - use powdered substitutes.  Avoid fish oil when coughing.    Please schedule a follow up office visit in 6 weeks, call sooner if needed

## 2020-10-13 NOTE — Progress Notes (Signed)
Sara Zhang, female    DOB: Jul 04, 1970   MRN: [id number]   Brief patient profile:  30 yobf never smoker but grew up with smokers with freq colds >chest typically post uri coughs last weeks to months referred to pulmonary clinic in [facility]  10/13/2020 by Jon Billings NP   for cough "worse since covid 23 Jan 2019 with nl cxr 08/10/20  and already d/c'd lisinopril 09/01/20      History of Present Illness  10/13/2020  Pulmonary/ 1st office eval/ Melvyn Novas / [employer] [facility]  Chief Complaint  Patient presents with   Consult    Cough  Dyspnea:  able to climb steps for 2nd floor appt s difficulty  Cough: minimally productive/ worse at hs on side 2 pillows x 9 months  but really cough goes back years and worse p covid Dec 2020 assoc urinary incont s overt HB Sleep: as above  SABA use: not helping  02  2lpm and cpap  2 different centers - does not know doctor's names but last studied in 2021 "ok"  No obvious day to day or daytime variability or assoc excess/ purulent sputum or mucus plugs or hemoptysis or cp or chest tightness, subjective wheeze or overt sinus or hb symptoms.     Also denies any obvious fluctuation of symptoms with weather or environmental changes or other aggravating or alleviating factors except as outlined above   No unusual exposure hx or h/o childhood pna/ asthma or knowledge of premature birth.  Current Allergies, Complete Past Medical History, Past Surgical History, Family History, and Social History were reviewed in [employer] record.  ROS  The following are not active complaints unless bolded Hoarseness, sore throat, dysphagia, dental problems, itching, sneezing,  nasal congestion or discharge of excess mucus or purulent secretions, ear ache,   fever, chills, sweats, unintended wt loss or wt gain, classically pleuritic or exertional cp,  orthopnea pnd or arm/hand swelling  or leg swelling, presyncope, palpitations, abdominal pain, anorexia,  nausea, vomiting, diarrhea  or change in bowel habits or change in bladder habits, change in stools or change in urine, dysuria, hematuria,  rash, arthralgias, visual complaints, headache, numbness, weakness or ataxia or problems with walking or coordination,  change in mood or  memory.           Past Medical History:  Diagnosis Date   Abnormal uterine bleeding (AUB) 11/25/2016   Chronic diastolic CHF (congestive heart failure) ([place])    a. 02/2012 Echo: EF 25-35%, glob HK; b. 06/2016 Echo: EF 55-60%, mild MR, nl PASP.   CKD (chronic kidney disease), stage II    COVID-19    Diabetes mellitus without complication ([place])    Extreme obesity    Hyperlipidemia    Hypertension    Hypoxia    Microcytic anemia    Pneumonia    Pneumonia    Sleep apnea    Status post endometrial ablation 06/20/2016   2015 hysteroscopy/D&C with NovaSure endometrial ablation; pathology-benign endometrial polyps   Thickened endometrium 06/20/2016   Ultrasound-22.3 mm endometrial stripe, heterogenous   Vaginal bleeding    a. 01/2017 s/p hyteroscopy and polypectomy [facility]).   Vitamin D deficiency     Outpatient Medications Prior to Visit  Medication Sig Dispense Refill   amLODipine (NORVASC) 5 MG tablet Take 1 tablet (5 mg total) by mouth daily. PLEASE CALL TO SCHEDULE APPOINTMENT FOR FURTHER REFILLS. THANK YOU! 90 tablet 0   atenolol (TENORMIN) 50 MG tablet Take 1 tablet (  50 mg total) by mouth daily. 90 tablet 4   atorvastatin (LIPITOR) 10 MG tablet TAKE 1 TABLET(10 MG) BY MOUTH DAILY 90 tablet 4   benzonatate (TESSALON) 200 MG capsule Take 1 capsule (200 mg total) by mouth 2 (two) times daily as needed for cough. 60 capsule 4   Blood Glucose Monitoring Suppl (CONTOUR NEXT MONITOR) w/Device KIT To check blood sugar 4 times daily. 1 kit 0   Calcium Carbonate-Vitamin D (OYSTER SHELL CALCIUM 500 + D) 500-125 MG-UNIT TABS Take 1 tablet by mouth daily.      chlorpheniramine-HYDROcodone (TUSSIONEX PENNKINETIC ER) 10-8  MG/5ML SUER Take 5 mLs by mouth every 12 (twelve) hours as needed for cough. 60 mL 0   Cholecalciferol (VITAMIN D3) 10 MCG (400 UNIT) tablet Take 400 Units by mouth daily.     Exenatide ER (BYDUREON BCISE) 2 MG/0.85ML AUIJ INJECT 2 MG UNDER THE SKIN EVERY WEEK 34 mL 4   FLOWFLEX COVID-19 AG HOME TEST KIT TEST AS DIRECTED TODAY     fluticasone (FLONASE) 50 MCG/ACT nasal spray Place 2 sprays into both nostrils daily. 16 g 6   furosemide (LASIX) 40 MG tablet Take 1 tablet (40 mg total) by mouth 2 (two) times daily. 180 tablet 3   glucose blood (CONTOUR NEXT TEST) test strip TEST BLOOD SUGAR ONCE DAILY 100 each 2   Insulin Glargine (TOUJEO SOLOSTAR) 300 UNIT/ML SOPN Inject 54 Units into the skin at bedtime. 6 pen 6   Insulin Pen Needle 32G X 4 MM MISC 1 Units by Does not apply route every morning. Pen needles 90 each 3   JARDIANCE 25 MG TABS tablet Take 1 tablet (25 mg total) by mouth daily. 90 tablet 4   Lancets (ONETOUCH ULTRASOFT) lancets Use as instructed 100 each 12   levonorgestrel (MIRENA) 20 MCG/24HR IUD 1 each by Intrauterine route once.      loratadine (CLARITIN) 10 MG tablet Take 1 tablet (10 mg total) by mouth daily. 30 tablet 11   losartan (COZAAR) 25 MG tablet Take 1 tablet (25 mg total) by mouth daily. 90 tablet 0   OXYGEN Inhale 2 L into the lungs daily.     albuterol (VENTOLIN HFA) 108 (90 Base) MCG/ACT inhaler INHALE 2 PUFFS INTO THE LUNGS EVERY 6 HOURS AS NEEDED FOR WHEEZING OR SHORTNESS OF BREATH 18 g 0   No facility-administered medications prior to visit.     Objective:     BP 130/82 (BP Location: Left Arm, Patient Position: Sitting, Cuff Size: Normal)   Pulse 80   Temp 98.3 F (36.8 C) (Oral)   Ht _0  (1.753 m)   Wt (!) 353 lb 12.8 oz (160.5 kg)   SpO2 93%   BMI 52.25 kg/m   SpO2: 93 %  Obese bf nad    HEENT : pt wearing mask not removed for exam due to covid -19 concerns.    NECK :  without JVD/Nodes/TM/ nl carotid upstrokes bilaterally   LUNGS: no  acc muscle use,  Nl contour chest which is clear to A and P bilaterally without cough on insp or exp maneuvers   CV:  RRR  no s3 or murmur or increase in P2, and no edema   ABD:  obese soft and nontender with nl inspiratory excursion in the supine position. No bruits or organomegaly appreciated, bowel sounds nl  MS:  Nl gait/ ext warm without deformities, calf tenderness, cyanosis or clubbing No obvious joint restrictions   SKIN: warm and dry without  lesions    NEURO:  alert, approp, nl sensorium with  no motor or cerebellar deficits apparent.     I personally reviewed images and agree with radiology impression as follows:  CXR:   08/10/20  No active cardiopulmonary disease.      Assessment   No problem-specific Assessment & Plan notes found for this encounter.     Christinia Gully, MD 10/13/2020

## 2020-10-14 ENCOUNTER — Encounter: Payer: Self-pay | Admitting: Internal Medicine

## 2020-10-14 DIAGNOSIS — J45991 Cough variant asthma: Secondary | ICD-10-CM | POA: Insufficient documentation

## 2020-10-14 NOTE — Assessment & Plan Note (Addendum)
Onset in childhood "colds always go into chest" - worse since covid 23 Jan 2019 - 09/01/20 d/c'd ACEi  - 10/13/2020 max rx for gerd   Features are somewhat suggestive of asthma but also likely has component of Upper airway cough syndrome (previously labeled PNDS),  is so named because it's frequently impossible to sort out how much is  CR/sinusitis with freq throat clearing (which can be related to primary GERD)   vs  causing  secondary (" extra esophageal")  GERD from wide swings in gastric pressure that occur with throat clearing, often  promoting self use of mint and menthol lozenges that reduce the lower esophageal sphincter tone and exacerbate the problem further in a cyclical fashion.   These are the same pts (now being labeled as having "irritable larynx syndrome" by some cough centers) who not infrequently have a history of having failed to tolerate ace inhibitors (as may have been the case here) ,  dry powder inhalers or biphosphonates or report having atypical/extraesophageal reflux symptoms that don't respond to standard doses of PPI  and are easily confused as having aecopd or asthma flares by even experienced allergists/ pulmonologists (myself included).   Of the three most common causes of  Sub-acute / recurrent or chronic cough, only one (GERD)  can actually contribute to/ trigger  the other two (asthma and post nasal drip syndrome)  and perpetuate the cylce of cough.  While not intuitively obvious, many patients with chronic low grade reflux do not cough until there is a primary insult that disturbs the protective epithelial barrier and exposes sensitive nerve endings.   This is typically viral but can due to PNDS and  either may apply here.     >>> The point is that once this occurs, it is difficult to eliminate the cycle  using anything but a maximally effective acid suppression regimen at least in the short run, accompanied by an appropriate diet to address non acid GERD and control /  eliminate pnds with 1st gen H1 blockers per guidelines  And  also so added 6 day taper off  Prednisone starting at 40 mg per day in case of component of Th-2 driven upper or lower airways inflammation (if cough responds short term only to relapse before return while will on full rx for uacs (as above), then  that would point to allergic rhinitis/ asthma or eos bronchitis as alternative dx)     >>> f/u in 6 weeks call sooner if needed          Each maintenance medication was reviewed in detail including emphasizing most importantly the difference between maintenance and prns and under what circumstances the prns are to be triggered using an action plan format where appropriate.  Total time for H and P, chart review, counseling,  and generating customized AVS unique to this initial office visit / same day charting = 35 min

## 2020-10-18 ENCOUNTER — Other Ambulatory Visit: Payer: Self-pay | Admitting: *Deleted

## 2020-10-18 MED ORDER — AMLODIPINE BESYLATE 5 MG PO TABS: 5.0000 mg | ORAL_TABLET | Freq: Every day | ORAL | 0 refills | Status: DC

## 2020-11-20 ENCOUNTER — Other Ambulatory Visit: Payer: Self-pay | Admitting: Internal Medicine

## 2020-11-22 ENCOUNTER — Other Ambulatory Visit: Payer: Self-pay | Admitting: Internal Medicine

## 2020-11-28 ENCOUNTER — Other Ambulatory Visit: Payer: Self-pay

## 2020-11-28 ENCOUNTER — Encounter: Payer: Self-pay | Admitting: Internal Medicine

## 2020-11-28 ENCOUNTER — Ambulatory Visit (INDEPENDENT_AMBULATORY_CARE_PROVIDER_SITE_OTHER): Payer: Medicare Other | Admitting: Internal Medicine

## 2020-11-28 DIAGNOSIS — J45991 Cough variant asthma: Secondary | ICD-10-CM | POA: Diagnosis not present

## 2020-11-28 NOTE — Assessment & Plan Note (Signed)
Onset in childhood "colds always go into chest" - worse since covid 23 Jan 2019 - 09/01/20 d/c'd ACEi  - 10/13/2020 max rx for gerd  > resolved as of 11/28/2020   Much more c/w uacs than asthma at this point ? Fueled by gerd or ACEi or both  rec  Taper off gerd rx Work on diet/ wt loss  In event of recurrent cough or "colds" immediately rx delsym 2 tsp q 12 and max otc gerd rx  And f/u here if not recovered in 2 weeks

## 2020-11-28 NOTE — Patient Instructions (Signed)
Stop pantoprazole (the one before bfast) and change Pepcid to where you take it after breakfast and supper x 1 week, then just take at bedtime for a week then stop.  For the furture if start coughing > Try delsym 2 tsp every 12 hours and  prilosec otc 20mg   Take 30-60 min before first meal of the day and Pepcid ac (famotidine) 20 mg one @  bedtime until cough is completely gone for at least a week without the need for cough suppression  - if not better w/in 2 weeks please call     If you are satisfied with your treatment plan,  let your doctor know and he/she can either refill your medications or you can return here when your prescription runs out.     If in any way you are not 100% satisfied,  please tell .  If 100% better, tell your friends!  Pulmonary follow up is as needed

## 2020-11-28 NOTE — Progress Notes (Signed)
Sara Zhang, female    DOB: 10-15-1970   MRN: [id number]   Brief patient profile:  70 yobf never smoker but grew up with smokers with freq colds >chest typically post uri coughs last weeks to months referred to pulmonary clinic in [facility]  10/13/2020 by Jon Billings NP   for cough "worse since covid 23 Jan 2019 with nl cxr 08/10/20  and already d/c'd lisinopril 09/01/20     History of Present Illness  10/13/2020  Pulmonary/ 1st office eval/ Jaece Ducharme / [employer]  Chief Complaint  Patient presents with   Consult    Cough  Dyspnea:  able to climb steps for 2nd floor appt s difficulty  Cough: minimally productive/ worse at hs on side 2 pillows x 9 months  but really cough goes back years and worse p covid Dec 2020 assoc urinary incont s overt HB Sleep: as above  SABA use: not helping  02  2lpm and cpap  2 different centers - does not know doctor's names but last studied in 2021 "ok" Rec Prednisone 10 mg take  4 each am x 2 days,   2 each am x 2 days,  1 each am x 2 days and stop  Pantoprazole (protonix) 40 mg   Take  30-60 min before first meal of the day and Pepcid (famotidine)  20 mg after supper until return to office   If not better at bedtime >> add CHLORPHENIRAMINE  4 mg   GERD diet reviewed, bed blocks rec    11/28/2020  f/u ov/Ellanie Oppedisano/ [facility] re: uacs   maint on gerd rx   Chief Complaint  Patient presents with   Follow-up    No concerns-   Dyspnea:  does steps fine, avg pace anywhere she wants to go  Cough: gone now  Sleeping: cpap /2lpm  hs  SABA use: none  Covid status:   vax x 2    No obvious day to day or daytime variability or assoc excess/ purulent sputum or mucus plugs or hemoptysis or cp or chest tightness, subjective wheeze or overt sinus or hb symptoms.   Sleeping now  without nocturnal  or early am exacerbation  of respiratory  c/o's or need for noct saba. Also denies any obvious fluctuation of symptoms with weather or environmental changes  or other aggravating or alleviating factors except as outlined above   No unusual exposure hx or h/o childhood pna/ asthma or knowledge of premature birth.  Current Allergies, Complete Past Medical History, Past Surgical History, Family History, and Social History were reviewed in [employer] record.  ROS  The following are not active complaints unless bolded Hoarseness, sore throat, dysphagia, dental problems, itching, sneezing,  nasal congestion or discharge of excess mucus or purulent secretions, ear ache,   fever, chills, sweats, unintended wt loss or wt gain, classically pleuritic or exertional cp,  orthopnea pnd or arm/hand swelling  or leg swelling, presyncope, palpitations, abdominal pain, anorexia, nausea, vomiting, diarrhea  or change in bowel habits or change in bladder habits, change in stools or change in urine, dysuria, hematuria,  rash, arthralgias, visual complaints, headache, numbness, weakness or ataxia or problems with walking or coordination,  change in mood or  memory.        Current Meds  Medication Sig   amLODipine (NORVASC) 5 MG tablet TAKE 1 TABLET(5 MG) BY MOUTH DAILY   atenolol (TENORMIN) 50 MG tablet Take 1 tablet (50 mg total) by mouth daily.  atorvastatin (LIPITOR) 10 MG tablet TAKE 1 TABLET(10 MG) BY MOUTH DAILY   Blood Glucose Monitoring Suppl (CONTOUR NEXT MONITOR) w/Device KIT To check blood sugar 4 times daily.   Calcium Carbonate-Vitamin D (OYSTER SHELL CALCIUM 500 + D) 500-125 MG-UNIT TABS Take 1 tablet by mouth daily.    Cholecalciferol (VITAMIN D3) 10 MCG (400 UNIT) tablet Take 400 Units by mouth daily.   Exenatide ER (BYDUREON BCISE) 2 MG/0.85ML AUIJ INJECT 2 MG UNDER THE SKIN EVERY WEEK   famotidine (PEPCID) 20 MG tablet One after supper   FLOWFLEX COVID-19 AG HOME TEST KIT TEST AS DIRECTED TODAY   furosemide (LASIX) 40 MG tablet Take 1 tablet (40 mg total) by mouth 2 (two) times daily.   glucose blood (CONTOUR NEXT TEST) test  strip TEST BLOOD SUGAR ONCE DAILY   Insulin Glargine (TOUJEO SOLOSTAR) 300 UNIT/ML SOPN Inject 54 Units into the skin at bedtime.   Insulin Pen Needle 32G X 4 MM MISC 1 Units by Does not apply route every morning. Pen needles   JARDIANCE 25 MG TABS tablet Take 1 tablet (25 mg total) by mouth daily.   Lancets (ONETOUCH ULTRASOFT) lancets Use as instructed   levonorgestrel (MIRENA) 20 MCG/24HR IUD 1 each by Intrauterine route once.    losartan (COZAAR) 25 MG tablet Take 1 tablet (25 mg total) by mouth daily.   OXYGEN Inhale 2 L into the lungs daily.   pantoprazole (PROTONIX) 40 MG tablet Take 1 tablet (40 mg total) by mouth daily. Take 30-60 min before first meal of the day                   Past Medical History:  Diagnosis Date   Abnormal uterine bleeding (AUB) 11/25/2016   Chronic diastolic CHF (congestive heart failure) ([place])    a. 02/2012 Echo: EF 25-35%, glob HK; b. 06/2016 Echo: EF 55-60%, mild MR, nl PASP.   CKD (chronic kidney disease), stage II    COVID-19    Diabetes mellitus without complication ([place])    Extreme obesity    Hyperlipidemia    Hypertension    Hypoxia    Microcytic anemia    Pneumonia    Pneumonia    Sleep apnea    Status post endometrial ablation 06/20/2016   2015 hysteroscopy/D&C with NovaSure endometrial ablation; pathology-benign endometrial polyps   Thickened endometrium 06/20/2016   Ultrasound-22.3 mm endometrial stripe, heterogenous   Vaginal bleeding    a. 01/2017 s/p hyteroscopy and polypectomy [facility]).   Vitamin D deficiency          Objective:      Wt Readings from Last 3 Encounters:  11/28/20 (!) 356 lb 9.6 oz (161.8 kg)  10/13/20 (!) 353 lb 12.8 oz (160.5 kg)  09/22/20 (!) 342 lb (155.1 kg)      Vital signs reviewed  11/28/2020  - Note at rest 02 sats  94% on RA   General appearance:    robust amb obese bf nad   HEENT : pt wearing mask not removed for exam due to covid -19 concerns.    NECK :  without JVD/Nodes/TM/ nl carotid  upstrokes bilaterally   LUNGS: no acc muscle use,  Nl contour chest which is clear to A and P bilaterally without cough on insp or exp maneuvers   CV:  RRR  no s3 or murmur or increase in P2, and no edema   ABD:  obese soft and nontender with nl inspiratory excursion in the supine position. No  bruits or organomegaly appreciated, bowel sounds nl  MS:  Nl gait/ ext warm without deformities, calf tenderness, cyanosis or clubbing No obvious joint restrictions   SKIN: warm and dry without lesions    NEURO:  alert, approp, nl sensorium with  no motor or cerebellar deficits apparent.         Assessment

## 2020-11-28 NOTE — Assessment & Plan Note (Signed)
Body mass index is 52.66 kg/m.  -  trending up still  Lab Results  Component Value Date   TSH 2.000 03/08/2020      Contributing to doe and risk of GERD >>>   reviewed the need and the process to achieve and maintain neg calorie balance > defer f/u primary care including intermittently monitoring thyroid status            Each maintenance medication was reviewed in detail including emphasizing most importantly the difference between maintenance and prns and under what circumstances the prns are to be triggered using an action plan format where appropriate.  Total time for H and P, chart review, counseling and generating customized AVS unique to this office visit / same day charting = 25 min

## 2020-12-01 ENCOUNTER — Other Ambulatory Visit: Payer: Self-pay | Admitting: Internal Medicine

## 2020-12-01 NOTE — Telephone Encounter (Signed)
Please contact pt for future appointment. Pt due for 12 month f/u. 

## 2020-12-07 ENCOUNTER — Other Ambulatory Visit: Payer: Self-pay | Admitting: Family Medicine

## 2020-12-07 ENCOUNTER — Other Ambulatory Visit: Payer: Self-pay

## 2020-12-07 ENCOUNTER — Other Ambulatory Visit: Payer: Self-pay | Admitting: Nurse Practitioner

## 2020-12-07 ENCOUNTER — Encounter: Payer: Self-pay | Admitting: Internal Medicine

## 2020-12-07 ENCOUNTER — Ambulatory Visit (INDEPENDENT_AMBULATORY_CARE_PROVIDER_SITE_OTHER): Payer: Medicare Other | Admitting: Internal Medicine

## 2020-12-07 VITALS — BP 120/80 | HR 79 | Ht 69.0 in | Wt 357.0 lb

## 2020-12-07 DIAGNOSIS — E785 Hyperlipidemia, unspecified: Secondary | ICD-10-CM

## 2020-12-07 DIAGNOSIS — I10 Essential (primary) hypertension: Secondary | ICD-10-CM

## 2020-12-07 DIAGNOSIS — E1169 Type 2 diabetes mellitus with other specified complication: Secondary | ICD-10-CM

## 2020-12-07 DIAGNOSIS — I5032 Chronic diastolic (congestive) heart failure: Secondary | ICD-10-CM | POA: Diagnosis not present

## 2020-12-07 DIAGNOSIS — I428 Other cardiomyopathies: Secondary | ICD-10-CM

## 2020-12-07 DIAGNOSIS — H40003 Preglaucoma, unspecified, bilateral: Secondary | ICD-10-CM | POA: Diagnosis not present

## 2020-12-07 LAB — HM DIABETES EYE EXAM

## 2020-12-07 NOTE — Progress Notes (Signed)
Follow-up Outpatient Visit Date: 12/07/2020  Primary Care Provider: Venita Lick, NP [place] [id number]  Chief Complaint: Leg swelling  HPI:  Sara Zhang is a 50 y.o. female with history of of heart failure (LVEF reportedly 25-35% in 2014 with subsequent normalization), HTN, HLD, OSA on CPAP, COVID-19 in 01/2019, and type II DM, who presents for follow-up of chronic heart failure.  I last saw her a year ago, at which time she was doing well.  She was trying to lose weight through diet and exercise.  She was using CPAP nightly as well as supplemental oxygen with activity.  We did not make any medication changes or pursue additional testing.  Today, Sara Zhang reports that she has been feeling fairly well though she is aware of more swelling and tightness in her legs.  She has also put on 10-15 pounds over the last year due to "not eating right.".  She had previously been on furosemide 40 mg twice daily, though for the last 1 to 2 months, she was only taking this once a day.  She began taking it twice daily again about a week ago.  She feels thirsty and is "drinking constantly."  She has also been eating more salt over the last few months.  She denies chest pain, shortness of breath, palpitations, and lightheadedness.  --------------------------------------------------------------------------------------------------  Cardiovascular History & Procedures: Cardiovascular Problems: Chronic systolic heart failure with normalization of LVEF   Risk Factors: Hypertension, hyperlipidemia, diabetes mellitus, and obesity   Cath/PCI: None   CV Surgery: None   EP Procedures and Devices: None   Non-Invasive Evaluation(s): TTE (11/23/2018): Normal LV size with mild LVH.  LVEF 55-60% with normal diastolic function.  Normal RV size and function.  Mild biatrial enlargement.  No significant valvular abnormality. TTE (06/10/16): Normal LV size and function with LVEF of 55-60%.  Normal diastolic parameters. Mild MR. Normal RV size and function. Normal pulmonary artery pressure. Report of LVEF 25-35% with global hypokinesis by outside echo on her before 03/04/12.  Recent CV Pertinent Labs: Lab Results  Component Value Date   CHOL 129 09/21/2020   CHOL 100 04/01/2019   HDL 43 09/21/2020   LDLCALC 73 09/21/2020   TRIG 60 09/21/2020   TRIG 66 04/01/2019   BNP 27.9 02/03/2019   K 5.1 09/21/2020   K 4.2 09/25/2013   MG 2.1 02/07/2019   BUN 10 09/21/2020   BUN 12 09/25/2013   CREATININE 0.97 09/21/2020   CREATININE 0.75 09/25/2013    Past medical and surgical history were reviewed and updated in EPIC.  Current Meds  Medication Sig   amLODipine (NORVASC) 5 MG tablet TAKE 1 TABLET(5 MG) BY MOUTH DAILY   atenolol (TENORMIN) 50 MG tablet Take 1 tablet (50 mg total) by mouth daily.   atorvastatin (LIPITOR) 10 MG tablet TAKE 1 TABLET(10 MG) BY MOUTH DAILY   Blood Glucose Monitoring Suppl (CONTOUR NEXT MONITOR) w/Device KIT To check blood sugar 4 times daily.   Calcium Carbonate-Vitamin D (OYSTER SHELL CALCIUM 500 + D) 500-125 MG-UNIT TABS Take 1 tablet by mouth daily.    Cholecalciferol (VITAMIN D3) 10 MCG (400 UNIT) tablet Take 400 Units by mouth daily.   Exenatide ER (BYDUREON BCISE) 2 MG/0.85ML AUIJ INJECT 2 MG UNDER THE SKIN EVERY WEEK   furosemide (LASIX) 40 MG tablet Take 1 tablet (40 mg total) by mouth 2 (two) times daily.   glucose blood (CONTOUR NEXT TEST) test strip TEST BLOOD SUGAR ONCE DAILY  Insulin Glargine (TOUJEO SOLOSTAR) 300 UNIT/ML SOPN Inject 54 Units into the skin at bedtime.   Insulin Pen Needle 32G X 4 MM MISC 1 Units by Does not apply route every morning. Pen needles   JARDIANCE 25 MG TABS tablet Take 1 tablet (25 mg total) by mouth daily.   Lancets (ONETOUCH ULTRASOFT) lancets Use as instructed   levonorgestrel (MIRENA) 20 MCG/24HR IUD 1 each by Intrauterine route once.    losartan (COZAAR) 25 MG tablet Take 1 tablet (25 mg total) by mouth  daily.   OXYGEN Inhale 2 L into the lungs daily.    Allergies: Patient has no known allergies.  Social History   Tobacco Use   Smoking status: Never   Smokeless tobacco: Never  Vaping Use   Vaping Use: Never used  Substance Use Topics   Alcohol use: Yes    Alcohol/week: 0.0 standard drinks    Comment: 1-2 beers/month   Drug use: No    Family History  Problem Relation Age of Onset   Diabetes Mother    Hyperlipidemia Mother    Hypertension Mother    Stroke Mother    Arthritis Father    Asthma Father    Diabetes Father    Hypertension Father    Hypertension Brother    Diabetes Brother    Diabetes Maternal Grandmother    Hypertension Maternal Grandmother    Diabetes Maternal Grandfather    Hypertension Maternal Grandfather    Diabetes Paternal Grandmother    Hypertension Paternal Grandmother    Heart attack Maternal Uncle    Breast cancer Neg Hx     Review of Systems: A 12-system review of systems was performed and was negative except as noted in the HPI.  --------------------------------------------------------------------------------------------------  Physical Exam: BP 120/80 (BP Location: Left Arm, Patient Position: Sitting, Cuff Size: Large)   Pulse 79   Ht _0  (1.753 m)   Wt (!) 357 lb (161.9 kg)   SpO2 91%   BMI 52.72 kg/m   General:  NAD. Neck: No JVD or HJR, though evaluation is limited by body habitus. Lungs: Clear to auscultation bilaterally without wheezes or crackles. Heart: Regular rate and rhythm without murmurs, rubs, or gallops. Abdomen: Soft, nontender, nondistended. Extremities: Trace pretibial edema bilaterally.  EKG: Normal sinus rhythm with first-degree AV block (PR interval 218 ms) and poor R wave progression, likely due to lead placement.  PR interval slightly longer compared with 11/08/2019.  Otherwise, no significant interval change.  Lab Results  Component Value Date   WBC 8.5 09/21/2020   HGB 12.0 09/21/2020   HCT 39.3  09/21/2020   MCV 86 09/21/2020   PLT 413 09/21/2020    Lab Results  Component Value Date   NA 140 09/21/2020   K 5.1 09/21/2020   CL 100 09/21/2020   CO2 25 09/21/2020   BUN 10 09/21/2020   CREATININE 0.97 09/21/2020   GLUCOSE 150 (H) 09/21/2020   ALT 12 09/21/2020    Lab Results  Component Value Date   CHOL 129 09/21/2020   HDL 43 09/21/2020   LDLCALC 73 09/21/2020   TRIG 60 09/21/2020    --------------------------------------------------------------------------------------------------  ASSESSMENT AND PLAN: Chronic HFpEF (recovered EF) due to nonischemic cardiomyopathy: Sara Zhang has put on some weight and complains of increasing leg edema over the last few months.  I suspect this is due to a combination of self de-escalation of furosemide to once daily as well as dietary indiscretion with increased fluid and sodium intake.  We  have discussed the importance of limiting her salt and fluid (ideally less than 64 ounces per day until edema resolves).  I have encouraged her to keep taking furosemide 40 mg twice daily.  We will have her return in 3 months for reevaluation.  If her edema has not improved, we will need to consider repeating an echocardiogram.  Continue current doses of atenolol and losartan.  Hypertension: Blood pressure reasonable today.  Continue current medications.  If edema persists, may need to consider stopping amlodipine in the future, though in the past she has not had significant swelling with this.  I favor dietary indiscretion and de-escalation of furosemide being the cause of her recent leg swelling.  Hyperlipidemia associated with type 2 diabetes mellitus: Continue atorvastatin and ongoing management of diabetes mellitus per Sara Zhang.  Morbid obesity: BMI greater than 50.  Weight loss encouraged through diet and exercise.  Follow-up: Return to clinic in 3 months.  Nelva Bush, MD 12/07/2020 11:53 AM

## 2020-12-07 NOTE — Patient Instructions (Addendum)
Medication Instructions:   Your physician recommends that you continue on your current medications as directed. Please refer to the Current Medication list given to you today.  CONTINUE Lasix 40 mg TWICE daily  PLEASE LIMIT WATER INTAKE TO 64 oz PER DAY  *If you need a refill on your cardiac medications before your next appointment, please call your pharmacy*   Lab Work:  None ordered  Testing/Procedures:  None ordered   Follow-Up: At [facility], you and your health needs are our priority.  As part of our continuing mission to provide you with exceptional heart care, we have created designated Provider Care Teams.  These Care Teams include your primary Cardiologist (physician) and Advanced Practice Providers (APPs -  Physician Assistants and Nurse Practitioners) who all work together to provide you with the care you need, when you need it.  We recommend signing up for the patient portal called "MyChart".  Sign up information is provided on this After Visit Summary.  MyChart is used to connect with patients for Virtual Visits (Telemedicine).  Patients are able to view lab/test results, encounter notes, upcoming appointments, etc.  Non-urgent messages can be sent to your provider as well.   To learn more about what you can do with MyChart, go to [email or web address].    Your next appointment:    3 month(s) w/ APP  The format for your next appointment:   In Person  Provider:   You may see one of the following Advanced Practice Providers on your designated Care Team:   Nicolasa Ducking, NP Eula Listen, PA-C Marisue Ivan, PA-C Cadence Gaston, [place]

## 2020-12-08 NOTE — Telephone Encounter (Signed)
Requested Prescriptions  Pending Prescriptions Disp Refills  . losartan (COZAAR) 25 MG tablet [Pharmacy Med Name: LOSARTAN 25MG  TABLETS] 90 tablet 0    Sig: TAKE 1 TABLET(25 MG) BY MOUTH DAILY     Cardiovascular:  Angiotensin Receptor Blockers Passed - 12/07/2020  5:24 PM      Passed - Cr in normal range and within 180 days    Creatinine  Date Value Ref Range Status  09/25/2013 0.75 0.60 - 1.30 mg/dL Final   Creatinine, Ser  Date Value Ref Range Status  09/21/2020 0.97 0.57 - 1.00 mg/dL Final         Passed - K in normal range and within 180 days    Potassium  Date Value Ref Range Status  09/21/2020 5.1 3.5 - 5.2 mmol/L Final  09/25/2013 4.2 3.5 - 5.1 mmol/L Final         Passed - Patient is not pregnant      Passed - Last BP in normal range    BP Readings from Last 1 Encounters:  12/07/20 120/80         Passed - Valid encounter within last 6 months    Recent Outpatient Visits          2 months ago Type 2 diabetes mellitus treated with insulin (HCC)   [facility] [place], Homer T, NP   3 months ago Cough   [facility], NP   3 months ago Cough   [facility] [place], Cody T, NP   4 months ago Upper respiratory tract infection, unspecified type   [facility] [place], Megan P, DO   5 months ago Cough   [facility] [place], [place], NP      Future Appointments            In 2 weeks Cannady, Dorie Rank, NP Dorie Rank, [facility]   In 3 months End, [employer], MD [facility] [place], [facility]   In 9 months  [facility], [facility]

## 2020-12-14 ENCOUNTER — Other Ambulatory Visit: Payer: Self-pay | Admitting: Nurse Practitioner

## 2020-12-14 NOTE — Telephone Encounter (Signed)
Rx 12/08/20 #90- future fill request Requested Prescriptions  Pending Prescriptions Disp Refills  . losartan (COZAAR) 25 MG tablet [Pharmacy Med Name: LOSARTAN 25MG  TABLETS] 90 tablet 0    Sig: TAKE 1 TABLET(25 MG) BY MOUTH DAILY     Cardiovascular:  Angiotensin Receptor Blockers Passed - 12/14/2020  8:03 AM      Passed - Cr in normal range and within 180 days    Creatinine  Date Value Ref Range Status  09/25/2013 0.75 0.60 - 1.30 mg/dL Final   Creatinine, Ser  Date Value Ref Range Status  09/21/2020 0.97 0.57 - 1.00 mg/dL Final         Passed - K in normal range and within 180 days    Potassium  Date Value Ref Range Status  09/21/2020 5.1 3.5 - 5.2 mmol/L Final  09/25/2013 4.2 3.5 - 5.1 mmol/L Final         Passed - Patient is not pregnant      Passed - Last BP in normal range    BP Readings from Last 1 Encounters:  12/07/20 120/80         Passed - Valid encounter within last 6 months    Recent Outpatient Visits          2 months ago Type 2 diabetes mellitus treated with insulin (HCC)   [facility] [place], [place] T, NP   3 months ago Cough   [facility], NP   4 months ago Cough   [facility] [place], [place] T, NP   4 months ago Upper respiratory tract infection, unspecified type   [facility] [place], Megan P, DO   5 months ago Cough   [facility] [place], [place], NP      Future Appointments            In 1 week Cannady, Dorie Rank, NP Dorie Rank, [facility]   In 3 months End, [employer], MD [facility] [place], [facility]   In 9 months  [facility], [facility]

## 2020-12-24 DIAGNOSIS — Z975 Presence of (intrauterine) contraceptive device: Secondary | ICD-10-CM | POA: Insufficient documentation

## 2020-12-25 ENCOUNTER — Ambulatory Visit (INDEPENDENT_AMBULATORY_CARE_PROVIDER_SITE_OTHER): Payer: Medicare Other | Admitting: Nurse Practitioner

## 2020-12-25 ENCOUNTER — Encounter: Payer: Self-pay | Admitting: Nurse Practitioner

## 2020-12-25 ENCOUNTER — Other Ambulatory Visit: Payer: Self-pay

## 2020-12-25 VITALS — BP 133/79 | HR 80 | Temp 98.3°F | Wt 356.2 lb

## 2020-12-25 DIAGNOSIS — I130 Hypertensive heart and chronic kidney disease with heart failure and stage 1 through stage 4 chronic kidney disease, or unspecified chronic kidney disease: Secondary | ICD-10-CM

## 2020-12-25 DIAGNOSIS — E785 Hyperlipidemia, unspecified: Secondary | ICD-10-CM | POA: Diagnosis not present

## 2020-12-25 DIAGNOSIS — R7989 Other specified abnormal findings of blood chemistry: Secondary | ICD-10-CM | POA: Diagnosis not present

## 2020-12-25 DIAGNOSIS — N182 Chronic kidney disease, stage 2 (mild): Secondary | ICD-10-CM

## 2020-12-25 DIAGNOSIS — R809 Proteinuria, unspecified: Secondary | ICD-10-CM

## 2020-12-25 DIAGNOSIS — Z794 Long term (current) use of insulin: Secondary | ICD-10-CM

## 2020-12-25 DIAGNOSIS — E1169 Type 2 diabetes mellitus with other specified complication: Secondary | ICD-10-CM | POA: Diagnosis not present

## 2020-12-25 DIAGNOSIS — E119 Type 2 diabetes mellitus without complications: Secondary | ICD-10-CM

## 2020-12-25 DIAGNOSIS — Z9989 Dependence on other enabling machines and devices: Secondary | ICD-10-CM

## 2020-12-25 DIAGNOSIS — N1831 Chronic kidney disease, stage 3a: Secondary | ICD-10-CM | POA: Diagnosis not present

## 2020-12-25 DIAGNOSIS — I5032 Chronic diastolic (congestive) heart failure: Secondary | ICD-10-CM | POA: Diagnosis not present

## 2020-12-25 DIAGNOSIS — Z6841 Body Mass Index (BMI) 40.0 and over, adult: Secondary | ICD-10-CM | POA: Diagnosis not present

## 2020-12-25 DIAGNOSIS — J45991 Cough variant asthma: Secondary | ICD-10-CM

## 2020-12-25 DIAGNOSIS — E1129 Type 2 diabetes mellitus with other diabetic kidney complication: Secondary | ICD-10-CM

## 2020-12-25 DIAGNOSIS — G4733 Obstructive sleep apnea (adult) (pediatric): Secondary | ICD-10-CM

## 2020-12-25 DIAGNOSIS — I428 Other cardiomyopathies: Secondary | ICD-10-CM | POA: Diagnosis not present

## 2020-12-25 DIAGNOSIS — E039 Hypothyroidism, unspecified: Secondary | ICD-10-CM | POA: Diagnosis not present

## 2020-12-25 LAB — MICROALBUMIN, URINE WAIVED
Creatinine, Urine Waived: 50 mg/dL (ref 10–300)
Microalb, Ur Waived: 80 mg/L — ABNORMAL HIGH (ref 0–19)

## 2020-12-25 LAB — BAYER DCA HB A1C WAIVED: HB A1C (BAYER DCA - WAIVED): 7.3 % — ABNORMAL HIGH (ref 4.8–5.6)

## 2020-12-25 MED ORDER — TRULICITY 1.5 MG/0.5ML ~~LOC~~ SOAJ: 1.5000 mg | SUBCUTANEOUS | 4 refills | Status: DC

## 2020-12-25 NOTE — Assessment & Plan Note (Signed)
Refer to morbid obesity plan of care. 

## 2020-12-25 NOTE — Assessment & Plan Note (Signed)
Chronic, ongoing with improvement with change to Losartan.

## 2020-12-25 NOTE — Assessment & Plan Note (Signed)
Chronic, ongoing with BP at goal today.  Continue current medication regimen and collaboration with cardiology.  Recommend checking BP three mornings a week at home + documenting for providers.  CMP today.  Recommend DASH diet focus and avoidance of high sodium foods, praised for cutting back on pork skins.  Return in 3 months.

## 2020-12-25 NOTE — Assessment & Plan Note (Signed)
Improving recent labs, will recheck CMP today and adjust regimen as needed.  Recommend continue ARB for kidney protection.  Consider nephrology referral in future if worsening.

## 2020-12-25 NOTE — Assessment & Plan Note (Signed)
BMI 52.60 with diabetes, HTN, HF.  Recommended eating smaller high protein, low fat meals more frequently and exercising 30 mins a day 5 times a week with a goal of 10-15lb weight loss in the next 3 months. Patient voiced their understanding and motivation to adhere to these recommendations.

## 2020-12-25 NOTE — Assessment & Plan Note (Signed)
Chronic, ongoing.  Followed by endo.  A1C 7.3% today and urine ALB 80.  Continue collaboration with endocrinology.  Continue current medication regimen, with exception of Bydureon -- will trial change to Trulicity 1.5 MG weekly and increase as needed -- could consider switch to Mounjaro in future, Lisinopril offering kidney protection.  Recommend she continue to monitor BS at home 2-3 times a day.  Refills as needed.  Return in 3 months.  

## 2020-12-25 NOTE — Patient Instructions (Addendum)
Calcium Carbonate-Vitamin D (OYSTER SHELL CALCIUM 500 + D) 500-125 MG-UNIT TABS  Cholecalciferol (VITAMIN D3) 10 MCG (400 UNIT) tablet  Diabetes Mellitus and Nutrition, Adult When you have diabetes, or diabetes mellitus, it is very important to have healthy eating habits because your blood sugar (glucose) levels are greatly affected by what you eat and drink. Eating healthy foods in the right amounts, at about the same times every day, can help you: Manage your blood glucose. Lower your risk of heart disease. Improve your blood pressure. Reach or maintain a healthy weight. What can affect my meal plan? Every person with diabetes is different, and each person has different needs for a meal plan. Your health care provider may recommend that you work with a dietitian to make a meal plan that is best for you. Your meal plan may vary depending on factors such as: The calories you need. The medicines you take. Your weight. Your blood glucose, blood pressure, and cholesterol levels. Your activity level. Other health conditions you have, such as heart or kidney disease. How do carbohydrates affect me? Carbohydrates, also called carbs, affect your blood glucose level more than any other type of food. Eating carbs raises the amount of glucose in your blood. It is important to know how many carbs you can safely have in each meal. This is different for every person. Your dietitian can help you calculate how many carbs you should have at each meal and for each snack. How does alcohol affect me? Alcohol can cause a decrease in blood glucose (hypoglycemia), especially if you use insulin or take certain diabetes medicines by mouth. Hypoglycemia can be a life-threatening condition. Symptoms of hypoglycemia, such as sleepiness, dizziness, and confusion, are similar to symptoms of having too much alcohol. Do not drink alcohol if: Your health care provider tells you not to drink. You are pregnant, may be  pregnant, or are planning to become pregnant. If you drink alcohol: Limit how much you have to: 0-1 drink a day for women. 0-2 drinks a day for men. Know how much alcohol is in your drink. In the [place] one drink equals one 12 oz bottle of beer (355 mL), one 5 oz glass of wine (148 mL), or one 1 oz glass of hard liquor (44 mL). Keep yourself hydrated with water, diet soda, or unsweetened iced tea. Keep in mind that regular soda, juice, and other mixers may contain a lot of sugar and must be counted as carbs. What are tips for following this plan? Reading food labels Start by checking the serving size on the Nutrition Facts label of packaged foods and drinks. The number of calories and the amount of carbs, fats, and other nutrients listed on the label are based on one serving of the item. Many items contain more than one serving per package. Check the total grams (g) of carbs in one serving. Check the number of grams of saturated fats and trans fats in one serving. Choose foods that have a low amount or none of these fats. Check the number of milligrams (mg) of salt (sodium) in one serving. Most people should limit total sodium intake to less than 2,300 mg per day. Always check the nutrition information of foods labeled as "low-fat" or "nonfat." These foods may be higher in added sugar or refined carbs and should be avoided. Talk to your dietitian to identify your daily goals for nutrients listed on the label. Shopping Avoid buying canned, pre-made, or processed foods. These foods tend to  be high in fat, sodium, and added sugar. Shop around the outside edge of the grocery store. This is where you will most often find fresh fruits and vegetables, bulk grains, fresh meats, and fresh dairy products. Cooking Use low-heat cooking methods, such as baking, instead of high-heat cooking methods, such as deep frying. Cook using healthy oils, such as olive, canola, or sunflower oil. Avoid cooking with  butter, cream, or high-fat meats. Meal planning Eat meals and snacks regularly, preferably at the same times every day. Avoid going long periods of time without eating. Eat foods that are high in fiber, such as fresh fruits, vegetables, beans, and whole grains. Eat 4-6 oz (112-168 g) of lean protein each day, such as lean meat, chicken, fish, eggs, or tofu. One ounce (oz) (28 g) of lean protein is equal to: 1 oz (28 g) of meat, chicken, or fish. 1 egg.  cup (62 g) of tofu. Eat some foods each day that contain healthy fats, such as avocado, nuts, seeds, and fish. What foods should I eat? Fruits Berries. Apples. Oranges. Peaches. Apricots. Plums. Grapes. Mangoes. Papayas. Pomegranates. Kiwi. Cherries. Vegetables Leafy greens, including lettuce, spinach, kale, chard, collard greens, mustard greens, and cabbage. Beets. Cauliflower. Broccoli. Carrots. Green beans. Tomatoes. Peppers. Onions. Cucumbers. Brussels sprouts. Grains Whole grains, such as whole-wheat or whole-grain bread, crackers, tortillas, cereal, and pasta. Unsweetened oatmeal. Quinoa. Brown or wild rice. Meats and other proteins Seafood. Poultry without skin. Lean cuts of poultry and beef. Tofu. Nuts. Seeds. Dairy Low-fat or fat-free dairy products such as milk, yogurt, and cheese. The items listed above may not be a complete list of foods and beverages you can eat and drink. Contact a dietitian for more information. What foods should I avoid? Fruits Fruits canned with syrup. Vegetables Canned vegetables. Frozen vegetables with butter or cream sauce. Grains Refined white flour and flour products such as bread, pasta, snack foods, and cereals. Avoid all processed foods. Meats and other proteins Fatty cuts of meat. Poultry with skin. Breaded or fried meats. Processed meat. Avoid saturated fats. Dairy Full-fat yogurt, cheese, or milk. Beverages Sweetened drinks, such as soda or iced tea. The items listed above may not be a  complete list of foods and beverages you should avoid. Contact a dietitian for more information. Questions to ask a health care provider Do I need to meet with a certified diabetes care and education specialist? Do I need to meet with a dietitian? What number can I call if I have questions? When are the best times to check my blood glucose? Where to find more information: American Diabetes Association: diabetes.org Academy of Nutrition and Dietetics: eatright.[employer] of Diabetes and Digestive and Kidney Diseases: StageSync.si Association of Diabetes Care & Education Specialists: diabeteseducator.org Summary It is important to have healthy eating habits because your blood sugar (glucose) levels are greatly affected by what you eat and drink. It is important to use alcohol carefully. A healthy meal plan will help you manage your blood glucose and lower your risk of heart disease. Your health care provider may recommend that you work with a dietitian to make a meal plan that is best for you. This information is not intended to replace advice given to you by your health care provider. Make sure you discuss any questions you have with your health care provider. Document Revised: 08/25/2019 Document Reviewed: 08/25/2019 Elsevier Patient Education  2022 [employer].

## 2020-12-25 NOTE — Assessment & Plan Note (Signed)
Recheck TSH and Free T4 today, initiate medication as needed.   

## 2020-12-25 NOTE — Assessment & Plan Note (Signed)
Ongoing, recommend supplement at home.  Check PTH, Phosphorous, and Magnesium levels today.

## 2020-12-25 NOTE — Assessment & Plan Note (Signed)
Chronic, ongoing.  Continue collaboration with cardiology and current medication regimen as prescribed by them.   

## 2020-12-25 NOTE — Assessment & Plan Note (Signed)
Chronic, ongoing.  Followed by endo.  A1C 7.3% today and urine ALB 80.  Continue collaboration with endocrinology.  Continue current medication regimen, with exception of Bydureon -- will trial change to Trulicity 1.5 MG weekly and increase as needed -- could consider switch to [facility] in future, Lisinopril offering kidney protection.  Recommend she continue to monitor BS at home 2-3 times a day.  Refills as needed.  Return in 3 months.

## 2020-12-25 NOTE — Assessment & Plan Note (Signed)
Chronic, ongoing with BP at goal.  Euvolemic today.  Continue current medication regimen and collaboration with cardiology.  Praised for cutting back on pork skins and recommend to continue this cut back.  Recommend: - Reminded to call for an overnight weight gain of >2 pounds or a weekly weight weight of >5 pounds - not adding salt to his food and has been reading food labels. Reviewed the importance of keeping daily sodium intake to 2000mg  daily -- NO PORK SKINS - Avoid Ibuprofen

## 2020-12-25 NOTE — Assessment & Plan Note (Signed)
Chronic, ongoing.  Continue current use of O2 at night.

## 2020-12-25 NOTE — Assessment & Plan Note (Signed)
Chronic, ongoing.  Continue current medication regimen as is at goal.  Lipid panel up to date.

## 2020-12-25 NOTE — Progress Notes (Signed)
BP 133/79   Pulse 80   Temp 98.3 F (36.8 C) (Oral)   Wt (!) 356 lb 3.2 oz (161.6 kg)   SpO2 95%   BMI 52.60 kg/m    Subjective:    Patient ID: Sara Zhang, female    DOB: 12/08/1970, 50 y.o.   MRN: [id number]  HPI: Sara Zhang is a 50 y.o. female  Chief Complaint  Patient presents with   Diabetes    Patient recent Diabetic Eye Exam was requested at today's visit. Patient denies having any concerns at today's visit.    Hyperlipidemia   Hypertension   Cough   Congestive Heart Failure   Chronic Kidney Disease   DIABETES Current medications include Jardiance 25 MG, Toujeo 54 units QHS, and Bydureon 2 MG weekly.  A1C last in August 7.3%.  Last saw endocrinology 06/12/20, no changes made on review of note -- she returns in December. She does endorse poor diet -- eating a little more junk.  She is not a big breakfast eater, may snack on chips. Polydipsia/polyuria: no Visual disturbance: no Chest pain: no Paresthesias: no Glucose Monitoring: yes             Accucheck frequency:              Fasting glucose: 90 to 110             Evening: 170 range             Before meals: Taking Insulin?: yes             Long acting insulin: 54 units             Short acting insulin: Blood Pressure Monitoring: not checking Retinal Examination: Up to Date -- last month [facility], Dr. Wallace Going Foot Exam: Up to Date Pneumovax: Up to Date Influenza: Not Up to Date -- refuses Aspirin: no    HYPERTENSION / HYPERLIPIDEMIA/HF Current medications include Losartan (no Lisinopril due to cough), Furosemide, Amlodipine, Atenolol for HTN + Lipitor for HLD.    Cough is improving at this time, saw Dr. Melvyn Novas last in 11/28/20 -- continues to use CPAP.  Followed by cardiology and last saw Dr. Saunders Revel 12/07/20, they recommend she take her Lasix BID, which she was not, due to edema.  Last echo in 2020 October was 55-60%. Satisfied with current treatment? yes Duration of hypertension: chronic BP  monitoring frequency: not checking BP range:  BP medication side effects: no Duration of hyperlipidemia: chronic Cholesterol medication side effects: no Cholesterol supplements: none Medication compliance: good compliance Aspirin: no Recent stressors: no Recurrent headaches: no Visual changes: no Palpitations: no Dyspnea: none recently Chest pain: no Lower extremity edema: improved Dizzy/lightheaded: no  CHRONIC KIDNEY DISEASE Recent kidney function stable, but continues to have low calcium.  Taking supplement.  TSH 4 years ago with mild elevation, recent has been stable. CKD status: stable Medications renally dose: yes Previous renal evaluation: no Pneumovax:  Up to Date Influenza Vaccine:  Not up to Date    Relevant past medical, surgical, family and social history reviewed and updated as indicated. Interim medical history since our last visit reviewed. Allergies and medications reviewed and updated.  Review of Systems  Constitutional:  Negative for activity change, appetite change, diaphoresis, fatigue and fever.  Respiratory:  Negative for cough, chest tightness, shortness of breath (improved) and wheezing.   Cardiovascular:  Positive for leg swelling (at baseline, no worse). Negative for chest pain and palpitations.  Gastrointestinal:  Negative.   Endocrine: Negative for polydipsia, polyphagia and polyuria.  Neurological: Negative.   Psychiatric/Behavioral: Negative.     Per HPI unless specifically indicated above     Objective:    BP 133/79   Pulse 80   Temp 98.3 F (36.8 C) (Oral)   Wt (!) 356 lb 3.2 oz (161.6 kg)   SpO2 95%   BMI 52.60 kg/m   Wt Readings from Last 3 Encounters:  12/25/20 (!) 356 lb 3.2 oz (161.6 kg)  12/07/20 (!) 357 lb (161.9 kg)  11/28/20 (!) 356 lb 9.6 oz (161.8 kg)    Physical Exam Vitals and nursing note reviewed.  Constitutional:      General: She is awake. She is not in acute distress.    Appearance: She is well-developed. She  is morbidly obese. She is not ill-appearing.  HENT:     Head: Normocephalic.     Right Ear: Hearing normal.     Left Ear: Hearing normal.  Eyes:     General: Lids are normal.        Right eye: No discharge.        Left eye: No discharge.     Conjunctiva/sclera: Conjunctivae normal.     Pupils: Pupils are equal, round, and reactive to light.  Neck:     Thyroid: No thyromegaly.     Vascular: No carotid bruit.  Cardiovascular:     Rate and Rhythm: Normal rate and regular rhythm.     Heart sounds: Normal heart sounds. No murmur heard.   No gallop.  Pulmonary:     Effort: Pulmonary effort is normal. No accessory muscle usage or respiratory distress.     Breath sounds: Normal breath sounds.     Comments: Lungs clear throughout. Abdominal:     General: Bowel sounds are normal.     Palpations: Abdomen is soft.  Musculoskeletal:     Cervical back: Normal range of motion and neck supple.     Right lower leg: Edema (trace) present.     Left lower leg: Edema (trace) present.  Skin:    General: Skin is warm and dry.  Neurological:     Mental Status: She is alert and oriented to person, place, and time.  Psychiatric:        Attention and Perception: Attention normal.        Mood and Affect: Mood normal.        Speech: Speech normal.        Behavior: Behavior normal. Behavior is cooperative.        Thought Content: Thought content normal.   Results for orders placed or performed in visit on 12/25/20  Bayer DCA Hb A1c Waived  Result Value Ref Range   HB A1C (BAYER DCA - WAIVED) 7.3 (H) 4.8 - 5.6 %  Microalbumin, Urine Waived  Result Value Ref Range   Microalb, Ur Waived 80 (H) 0 - 19 mg/L   Creatinine, Urine Waived 50 10 - 300 mg/dL   Microalb/Creat Ratio 30-300 (H) <30 mg/g      Assessment & Plan:   Problem List Items Addressed This Visit       Cardiovascular and Mediastinum   Benign hypertensive heart and kidney disease with CHF and stage 2 chronic kidney disease (HCC)     Chronic, ongoing with BP at goal today.  Continue current medication regimen and collaboration with cardiology.  Recommend checking BP three mornings a week at home + documenting for providers.  CMP today.  Recommend DASH diet focus and avoidance of high sodium foods, praised for cutting back on pork skins.  Return in 3 months.      Chronic heart failure with preserved ejection fraction (HCC)    Chronic, ongoing with BP at goal.  Euvolemic today.  Continue current medication regimen and collaboration with cardiology.  Praised for cutting back on pork skins and recommend to continue this cut back.  Recommend: - Reminded to call for an overnight weight gain of >2 pounds or a weekly weight weight of >5 pounds - not adding salt to his food and has been reading food labels. Reviewed the importance of keeping daily sodium intake to 2000mg  daily -- NO PORK SKINS - Avoid Ibuprofen      Relevant Orders   Comprehensive metabolic panel   NICM (nonischemic cardiomyopathy) (HCC)    Chronic, ongoing.  Continue collaboration with cardiology and current medication regimen as prescribed by them.        Respiratory   Cough variant asthma vs Upper Airway cough syndrome    Chronic, ongoing with improvement with change to Losartan.      OSA on CPAP    Chronic, ongoing.  Continue current use of O2 at night.        Endocrine   Hyperlipidemia associated with type 2 diabetes mellitus (HCC)    Chronic, ongoing.  Continue current medication regimen as is at goal.  Lipid panel up to date.      Relevant Medications   Dulaglutide (TRULICITY) 1.5 0000000 SOPN   Other Relevant Orders   Bayer DCA Hb A1c Waived (Completed)   Proteinuria due to type 2 diabetes mellitus (HCC)    Chronic, ongoing.  Followed by endo.  A1C 7.3% today and urine ALB 80.  Continue collaboration with endocrinology.  Continue current medication regimen, with exception of Bydureon -- will trial change to Trulicity 1.5 MG weekly and  increase as needed -- could consider switch to [facility] in future, Lisinopril offering kidney protection.  Recommend she continue to monitor BS at home 2-3 times a day.  Refills as needed.  Return in 3 months.       Relevant Medications   Dulaglutide (TRULICITY) 1.5 0000000 SOPN   Other Relevant Orders   Bayer DCA Hb A1c Waived (Completed)   Microalbumin, Urine Waived (Completed)   Type 2 diabetes mellitus treated with insulin (HCC) - Primary    Chronic, ongoing.  Followed by endo.  A1C 7.3% today and urine ALB 80.  Continue collaboration with endocrinology.  Continue current medication regimen, with exception of Bydureon -- will trial change to Trulicity 1.5 MG weekly and increase as needed -- could consider switch to [facility] in future, Lisinopril offering kidney protection.  Recommend she continue to monitor BS at home 2-3 times a day.  Refills as needed.  Return in 3 months.      Relevant Medications   Dulaglutide (TRULICITY) 1.5 0000000 SOPN   Other Relevant Orders   Bayer DCA Hb A1c Waived (Completed)   Microalbumin, Urine Waived (Completed)     Genitourinary   CKD (chronic kidney disease) stage 3, GFR 30-59 ml/min (HCC)    Improving recent labs, will recheck CMP today and adjust regimen as needed.  Recommend continue ARB for kidney protection.  Consider nephrology referral in future if worsening.      Relevant Orders   Comprehensive metabolic panel     Other   BMI 50.0-59.9, adult ([place])    Refer to morbid obesity plan of  care.      Relevant Medications   Dulaglutide (TRULICITY) 1.5 0000000 SOPN   Elevated TSH    Recheck TSH and Free T4 today, initiate medication as needed.      Relevant Orders   T4, free   TSH   Hypocalcemia    Ongoing, recommend supplement at home.  Check PTH, Phosphorous, and Magnesium levels today.      Relevant Orders   PTH, Intact and Calcium   Phosphorus   Magnesium   Comprehensive metabolic panel   Morbid obesity (HCC)    BMI 52.60  with diabetes, HTN, HF.  Recommended eating smaller high protein, low fat meals more frequently and exercising 30 mins a day 5 times a week with a goal of 10-15lb weight loss in the next 3 months. Patient voiced their understanding and motivation to adhere to these recommendations.       Relevant Medications   Dulaglutide (TRULICITY) 1.5 0000000 SOPN     Follow up plan: Return in about 3 months (around 03/27/2021) for T2DM, HTN/HLD, HF, CKD.

## 2020-12-26 LAB — TSH: TSH: 2.78 u[IU]/mL (ref 0.450–4.500)

## 2020-12-26 LAB — COMPREHENSIVE METABOLIC PANEL
ALT: 10 IU/L (ref 0–32)
AST: 13 IU/L (ref 0–40)
Albumin/Globulin Ratio: 1.5 (ref 1.2–2.2)
Albumin: 4.1 g/dL (ref 3.8–4.9)
Alkaline Phosphatase: 120 IU/L (ref 44–121)
BUN/Creatinine Ratio: 14 (ref 9–23)
BUN: 15 mg/dL (ref 6–24)
Bilirubin Total: 0.3 mg/dL (ref 0.0–1.2)
CO2: 27 mmol/L (ref 20–29)
Calcium: 8.4 mg/dL — ABNORMAL LOW (ref 8.7–10.2)
Chloride: 103 mmol/L (ref 96–106)
Creatinine, Ser: 1.06 mg/dL — ABNORMAL HIGH (ref 0.57–1.00)
Globulin, Total: 2.7 g/dL (ref 1.5–4.5)
Glucose: 80 mg/dL (ref 70–99)
Potassium: 4.5 mmol/L (ref 3.5–5.2)
Sodium: 142 mmol/L (ref 134–144)
Total Protein: 6.8 g/dL (ref 6.0–8.5)
eGFR: 63 mL/min/{1.73_m2} (ref >59)

## 2020-12-26 LAB — PHOSPHORUS: Phosphorus: 3.4 mg/dL (ref 3.0–4.3)

## 2020-12-26 LAB — MAGNESIUM: Magnesium: 2.4 mg/dL — ABNORMAL HIGH (ref 1.6–2.3)

## 2020-12-26 LAB — PTH, INTACT AND CALCIUM: PTH: 50 pg/mL (ref 15–65)

## 2020-12-26 LAB — T4, FREE: Free T4: 1.06 ng/dL (ref 0.82–1.77)

## 2020-12-26 NOTE — Progress Notes (Signed)
Contacted via MyChart Good evening Sara Zhang, your labs have returned: - Calcium level remains a little on low side -- this could be related to Lasix, but continue this and continue to take calcium supplement daily.  Your parathyroid is normal and so is phosphorous.  Magnesium is mildly elevated, if taking any magnesium supplement cut back on this. - Kidney function, creatinine and eGFR, remains stable.  Liver function is normal. AST and ALT. - Thyroid levels are normal.  Any questions? Keep being awesome!!  Thank you for allowing me to participate in your care.  I appreciate you. Kindest regards, Shahd Occhipinti

## 2021-01-12 ENCOUNTER — Other Ambulatory Visit: Payer: Self-pay | Admitting: Internal Medicine

## 2021-02-08 DIAGNOSIS — E1169 Type 2 diabetes mellitus with other specified complication: Secondary | ICD-10-CM | POA: Diagnosis not present

## 2021-02-08 DIAGNOSIS — E669 Obesity, unspecified: Secondary | ICD-10-CM | POA: Diagnosis not present

## 2021-02-08 DIAGNOSIS — Z794 Long term (current) use of insulin: Secondary | ICD-10-CM | POA: Diagnosis not present

## 2021-02-08 DIAGNOSIS — R809 Proteinuria, unspecified: Secondary | ICD-10-CM | POA: Diagnosis not present

## 2021-02-08 DIAGNOSIS — E1129 Type 2 diabetes mellitus with other diabetic kidney complication: Secondary | ICD-10-CM | POA: Diagnosis not present

## 2021-02-19 ENCOUNTER — Other Ambulatory Visit: Payer: Self-pay | Admitting: Internal Medicine

## 2021-02-20 ENCOUNTER — Other Ambulatory Visit: Payer: Self-pay | Admitting: Internal Medicine

## 2021-03-14 ENCOUNTER — Ambulatory Visit (INDEPENDENT_AMBULATORY_CARE_PROVIDER_SITE_OTHER): Payer: Medicare Other | Admitting: Internal Medicine

## 2021-03-14 ENCOUNTER — Other Ambulatory Visit: Payer: Self-pay

## 2021-03-14 ENCOUNTER — Encounter: Payer: Self-pay | Admitting: Internal Medicine

## 2021-03-14 VITALS — BP 114/80 | HR 75 | Ht 69.0 in | Wt 371.0 lb

## 2021-03-14 DIAGNOSIS — I5032 Chronic diastolic (congestive) heart failure: Secondary | ICD-10-CM | POA: Diagnosis not present

## 2021-03-14 DIAGNOSIS — I428 Other cardiomyopathies: Secondary | ICD-10-CM

## 2021-03-14 DIAGNOSIS — I10 Essential (primary) hypertension: Secondary | ICD-10-CM

## 2021-03-14 DIAGNOSIS — E785 Hyperlipidemia, unspecified: Secondary | ICD-10-CM | POA: Diagnosis not present

## 2021-03-14 DIAGNOSIS — E1169 Type 2 diabetes mellitus with other specified complication: Secondary | ICD-10-CM

## 2021-03-14 NOTE — Patient Instructions (Signed)
Medication Instructions:  Your physician recommends that you continue on your current medications as directed. Please refer to the Current Medication list given to you today.  *If you need a refill on your cardiac medications before your next appointment, please call your pharmacy*   Lab Work: None ordered  If you have labs (blood work) drawn today and your tests are completely normal, you will receive your results only by: MyChart Message (if you have MyChart) OR A paper copy in the mail If you have any lab test that is abnormal or we need to change your treatment, we will call you to review the results.   Testing/Procedures: None ordered  Follow-Up: At [facility], you and your health needs are our priority.  As part of our continuing mission to provide you with exceptional heart care, we have created designated Provider Care Teams.  These Care Teams include your primary Cardiologist (physician) and Advanced Practice Providers (APPs -  Physician Assistants and Nurse Practitioners) who all work together to provide you with the care you need, when you need it.  We recommend signing up for the patient portal called "MyChart".  Sign up information is provided on this After Visit Summary.  MyChart is used to connect with patients for Virtual Visits (Telemedicine).  Patients are able to view lab/test results, encounter notes, upcoming appointments, etc.  Non-urgent messages can be sent to your provider as well.   To learn more about what you can do with MyChart, go to [email or web address].    Your next appointment:   6 month(s)  The format for your next appointment:   In Person  Provider:   You may see Yvonne Kendall, MD or one of the following Advanced Practice Providers on your designated Care Team:   Nicolasa Ducking, NP Eula Listen, PA-C Cadence Fransico Michael, PA-C{  Other Instructions Please cut out sweets and sodas from your diet.

## 2021-03-14 NOTE — Progress Notes (Signed)
Follow-up Outpatient Visit Date: 03/14/2021  Primary Care Provider: Venita Lick, NP [place] [address]  Chief Complaint: Follow-up heart failure  HPI:  Sara Zhang is a 51 y.o. female with history of heart failure (LVEF reportedly 25-35% in 2014 with subsequent normalization), HTN, HLD, OSA on CPAP, COVID-19 in 01/2019, and type II DM, who presents for follow-up of chronic heart failure.  I last saw her in November, at which time she noted more swelling/tightness in her legs.  She had put on 10-15 minutes over the last year due to dietary indiscretion.  She was previously taking furosemide 40 mg BID but had only been taking this once daily for the last 1-2 months due to increased thirst.  I recommended trying to limit her fluid intake to 64 oz./day and increasing furosemide back to 40 mg BID.  Today, Sara Zhang reports that she is feeling well, though she is discouraged by her continued weight gain.  She attributes this to eating too much chocolate and consuming too many sweetened drinks.  Her leg swelling has improved and is minimal.  She denies chest pain, shortness of breath, palpitations, and lightheadedness.  --------------------------------------------------------------------------------------------------  Cardiovascular History & Procedures: Cardiovascular Problems: Chronic systolic heart failure with normalization of LVEF   Risk Factors: Hypertension, hyperlipidemia, diabetes mellitus, and obesity   Cath/PCI: None   CV Surgery: None   EP Procedures and Devices: None   Non-Invasive Evaluation(s): TTE (11/23/2018): Normal LV size with mild LVH.  LVEF 55-60% with normal diastolic function.  Normal RV size and function.  Mild biatrial enlargement.  No significant valvular abnormality. TTE (06/10/16): Normal LV size and function with LVEF of 55-60%. Normal diastolic parameters. Mild MR. Normal RV size and function. Normal pulmonary artery pressure. Report  of LVEF 25-35% with global hypokinesis by outside echo on her before 03/04/12.  Recent CV Pertinent Labs: Lab Results  Component Value Date   CHOL 129 09/21/2020   CHOL 100 04/01/2019   HDL 43 09/21/2020   LDLCALC 73 09/21/2020   TRIG 60 09/21/2020   TRIG 66 04/01/2019   BNP 27.9 02/03/2019   K 4.5 12/25/2020   K 4.2 09/25/2013   MG 2.4 (H) 12/25/2020   BUN 15 12/25/2020   BUN 12 09/25/2013   CREATININE 1.06 (H) 12/25/2020   CREATININE 0.75 09/25/2013    Past medical and surgical history were reviewed and updated in EPIC.  Current Meds  Medication Sig   amLODipine (NORVASC) 5 MG tablet TAKE 1 TABLET(5 MG) BY MOUTH DAILY   atenolol (TENORMIN) 50 MG tablet Take 1 tablet (50 mg total) by mouth daily.   atorvastatin (LIPITOR) 10 MG tablet TAKE 1 TABLET(10 MG) BY MOUTH DAILY   Blood Glucose Monitoring Suppl (CONTOUR NEXT MONITOR) w/Device KIT To check blood sugar 4 times daily.   Calcium Carbonate-Vitamin D (OYSTER SHELL CALCIUM 500 + D) 500-125 MG-UNIT TABS Take 1 tablet by mouth daily.    Cholecalciferol (VITAMIN D3) 10 MCG (400 UNIT) tablet Take 400 Units by mouth daily.   Dulaglutide (TRULICITY) 1.5 SH/7.53YO SOPN Inject 1.5 mg into the skin once a week.   furosemide (LASIX) 40 MG tablet Take 1 tablet (40 mg total) by mouth 2 (two) times daily.   glucose blood (CONTOUR NEXT TEST) test strip TEST BLOOD SUGAR ONCE DAILY   Insulin Glargine (TOUJEO SOLOSTAR) 300 UNIT/ML SOPN Inject 54 Units into the skin at bedtime.   Insulin Pen Needle 32G X 4 MM MISC 1 Units by  Does not apply route every morning. Pen needles   JARDIANCE 25 MG TABS tablet Take 1 tablet (25 mg total) by mouth daily.   Lancets (ONETOUCH ULTRASOFT) lancets Use as instructed   levonorgestrel (MIRENA) 20 MCG/24HR IUD 1 each by Intrauterine route once.    losartan (COZAAR) 25 MG tablet TAKE 1 TABLET(25 MG) BY MOUTH DAILY   OXYGEN Inhale 2 L into the lungs daily.    Allergies: Patient has no known allergies.  Social  History   Tobacco Use   Smoking status: Never   Smokeless tobacco: Never  Vaping Use   Vaping Use: Never used  Substance Use Topics   Alcohol use: Yes    Alcohol/week: 0.0 standard drinks    Comment: 1-2 beers/month   Drug use: No    Family History  Problem Relation Age of Onset   Diabetes Mother    Hyperlipidemia Mother    Hypertension Mother    Stroke Mother    Arthritis Father    Asthma Father    Diabetes Father    Hypertension Father    Hypertension Brother    Diabetes Brother    Diabetes Maternal Grandmother    Hypertension Maternal Grandmother    Diabetes Maternal Grandfather    Hypertension Maternal Grandfather    Diabetes Paternal Grandmother    Hypertension Paternal Grandmother    Heart attack Maternal Uncle    Breast cancer Neg Hx     Review of Systems: A 12-system review of systems was performed and was negative except as noted in the HPI.  --------------------------------------------------------------------------------------------------  Physical Exam: BP 114/80 (BP Location: Left Arm, Patient Position: Sitting, Cuff Size: Large)    Pulse 75    Ht _0  (1.753 m)    Wt (!) 371 lb (168.3 kg)    SpO2 95%    BMI 54.79 kg/m   General:  NAD. Neck: Unable to assess JVP due to body habitus. Lungs: Clear to auscultation bilaterally without wheezes or crackles. Heart: Distant heart sounds.  Regular rate and rhythm without murmurs, rubs, or gallops. Abdomen: Soft, nontender, nondistended. Extremities: Trace ankle edema bilaterally.   Lab Results  Component Value Date   WBC 8.5 09/21/2020   HGB 12.0 09/21/2020   HCT 39.3 09/21/2020   MCV 86 09/21/2020   PLT 413 09/21/2020    Lab Results  Component Value Date   NA 142 12/25/2020   K 4.5 12/25/2020   CL 103 12/25/2020   CO2 27 12/25/2020   BUN 15 12/25/2020   CREATININE 1.06 (H) 12/25/2020   GLUCOSE 80 12/25/2020   ALT 10 12/25/2020    Lab Results  Component Value Date   CHOL 129 09/21/2020    HDL 43 09/21/2020   LDLCALC 73 09/21/2020   TRIG 60 09/21/2020    --------------------------------------------------------------------------------------------------  ASSESSMENT AND PLAN: Chronic HFpEF (recovered LVEF) due to nonischemic cardiomyopathy: Volume status appears to have improved with furosemide 40 mg BID, though weight is up further from our last visit (patient reports continued dietary indiscretion).  Continue current medications.  Lifestyle modifications were reinforced.  Hypertension: BP well-controlled.  Continue current medications.  Hyperlipidemia associated with type 2 diabetes mellitus: Continue atorvastatin.  Ongoing management of DM per Sara Zhang and Dr. Honor Junes.  Morbid obesity: Weight continues to climb due to dietary indiscretion.  Weight loss strategies discussed at length.  Follow-up: Return to clinic in 6 months.  Nelva Bush, MD 03/15/2021 3:41 PM

## 2021-03-15 ENCOUNTER — Encounter: Payer: Self-pay | Admitting: Internal Medicine

## 2021-03-16 ENCOUNTER — Other Ambulatory Visit: Payer: Self-pay | Admitting: Nurse Practitioner

## 2021-03-16 NOTE — Telephone Encounter (Signed)
Refused this duplicate request for Jardiance 25 mg tablets because it was approved this morning 03/16/2021 #90, 0 refills.

## 2021-03-16 NOTE — Telephone Encounter (Signed)
Requested Prescriptions  Pending Prescriptions Disp Refills   losartan (COZAAR) 25 MG tablet [Pharmacy Med Name: LOSARTAN 25MG  TABLETS] 90 tablet 0    Sig: TAKE 1 TABLET(25 MG) BY MOUTH DAILY     Cardiovascular:  Angiotensin Receptor Blockers Failed - 03/16/2021  9:58 AM      Failed - Cr in normal range and within 180 days    Creatinine  Date Value Ref Range Status  09/25/2013 0.75 0.60 - 1.30 mg/dL Final   Creatinine, Ser  Date Value Ref Range Status  12/25/2020 1.06 (H) 0.57 - 1.00 mg/dL Final         Passed - K in normal range and within 180 days    Potassium  Date Value Ref Range Status  12/25/2020 4.5 3.5 - 5.2 mmol/L Final  09/25/2013 4.2 3.5 - 5.1 mmol/L Final         Passed - Patient is not pregnant      Passed - Last BP in normal range    BP Readings from Last 1 Encounters:  03/14/21 114/80         Passed - Valid encounter within last 6 months    Recent Outpatient Visits          2 months ago Type 2 diabetes mellitus treated with insulin ([place])   [place], [place] T, NP   5 months ago Type 2 diabetes mellitus treated with insulin ([place])   [place], [place] T, NP   6 months ago Cough   [place], NP   7 months ago Cough   [place], [place] T, NP   7 months ago Upper respiratory tract infection, unspecified type   [place], [place], DO      Future Appointments            In 1 week [place], Barbaraann Faster, NP [employer] [facility], [facility]   In 6 months  [employer]

## 2021-03-16 NOTE — Telephone Encounter (Signed)
Requested Prescriptions  Pending Prescriptions Disp Refills   JARDIANCE 25 MG TABS tablet [Pharmacy Med Name: JARDIANCE 25MG TABLETS] 90 tablet 4    Sig: TAKE 1 TABLET(25 MG) BY MOUTH DAILY     Endocrinology:  Diabetes - SGLT2 Inhibitors Failed - 03/16/2021  9:58 AM      Failed - Cr in normal range and within 360 days    Creatinine  Date Value Ref Range Status  09/25/2013 0.75 0.60 - 1.30 mg/dL Final   Creatinine, Ser  Date Value Ref Range Status  12/25/2020 1.06 (H) 0.57 - 1.00 mg/dL Final         Passed - HBA1C is between 0 and 7.9 and within 180 days    HB A1C (BAYER DCA - WAIVED)  Date Value Ref Range Status  12/25/2020 7.3 (H) 4.8 - 5.6 % Final    Comment:             Prediabetes: 5.7 - 6.4          Diabetes: >6.4          Glycemic control for adults with diabetes: <7.0          Passed - eGFR in normal range and within 360 days    EGFR (African American)  Date Value Ref Range Status  09/25/2013 >60  Final   GFR calc Af Amer  Date Value Ref Range Status  03/08/2020 61 >59 mL/min/1.73 Final    Comment:    **In accordance with recommendations from the NKF-ASN Task force,**   [facility] is in the process of updating its eGFR calculation to the   2021 CKD-EPI creatinine equation that estimates kidney function   without a race variable.    EGFR (Non-African Amer.)  Date Value Ref Range Status  09/25/2013 >60  Final    Comment:    eGFR values <66m/min/1.73 m2 may be an indication of chronic kidney disease (CKD). Calculated eGFR is useful in patients with stable renal function. The eGFR calculation will not be reliable in acutely ill patients when serum creatinine is changing rapidly. It is not useful in  patients on dialysis. The eGFR calculation may not be applicable to patients at the low and high extremes of body sizes, pregnant women, and vegetarians.    GFR, Estimated  Date Value Ref Range Status  02/04/2020 >60 >60 mL/min Final    Comment:     (NOTE) Calculated using the CKD-EPI Creatinine Equation (2021)    GFR calc non Af Amer  Date Value Ref Range Status  03/08/2020 53 (L) >59 mL/min/1.73 Final   eGFR  Date Value Ref Range Status  12/25/2020 63 >59 mL/min/1.73 Final         Passed - Valid encounter within last 6 months    Recent Outpatient Visits          2 months ago Type 2 diabetes mellitus treated with insulin ([place]   [place], NP   5 months ago Type 2 diabetes mellitus treated with insulin ([place]   [place]   6 months ago Cough   [place] NP   7 months ago Cough   [facility] [place]   7 months ago Upper respiratory tract infection, unspecified type   [facility]      Future Appointments            In 1 week  Sara Lick, NP [place]   In 6 months  [facility] [facility], [facility]

## 2021-03-25 NOTE — Patient Instructions (Signed)

## 2021-03-26 ENCOUNTER — Ambulatory Visit (INDEPENDENT_AMBULATORY_CARE_PROVIDER_SITE_OTHER): Payer: Medicare Other | Admitting: Internal Medicine

## 2021-03-26 VITALS — BP 110/68 | HR 71 | Resp 16 | Ht 69.0 in | Wt 367.0 lb

## 2021-03-26 DIAGNOSIS — I5032 Chronic diastolic (congestive) heart failure: Secondary | ICD-10-CM

## 2021-03-26 DIAGNOSIS — G4733 Obstructive sleep apnea (adult) (pediatric): Secondary | ICD-10-CM | POA: Diagnosis not present

## 2021-03-26 DIAGNOSIS — Z7189 Other specified counseling: Secondary | ICD-10-CM | POA: Diagnosis not present

## 2021-03-26 NOTE — Patient Instructions (Signed)

## 2021-03-26 NOTE — Progress Notes (Signed)
[facility] [place],  [id number]  Pulmonary Sleep Medicine   Office Visit Note  Patient Name: Sara Zhang DOB: Dec 14, 1970 MRN [id number]    Chief Complaint: Obstructive Sleep Apnea visit  Brief History:  Latreshia is seen today for follow up visit.   The patient has a 8 year history of sleep apnea. Patient is not using PAP nightly.  The patient feels alright after sleeping with PAP.  The patient reports benefitting from PAP use. Reported sleepiness is  better and the Epworth Sleepiness Score is 3 out of 24. The patient evening 30 - 60 minute take naps. The patient complains of the following: not bing able to wear it when sick and coughing  The compliance download shows  compliance with an average use time of 4:38 hours@ 67%. The AHI is 1.2  The patient does not complain of limb movements disrupting sleep. She uses oxygen nightly at 2L.   ROS  General: (-) fever, (-) chills, (-) night sweat Nose and Sinuses: (-) nasal stuffiness or itchiness, (-) postnasal drip, (-) nosebleeds, (-) sinus trouble. Mouth and Throat: (-) sore throat, (-) hoarseness. Neck: (-) swollen glands, (-) enlarged thyroid, (-) neck pain. Respiratory: - cough, - shortness of breath, - wheezing. Neurologic: - numbness, - tingling. Psychiatric: - anxiety, - depression   Current Medication: Outpatient Encounter Medications as of 03/26/2021  Medication Sig   amLODipine (NORVASC) 5 MG tablet TAKE 1 TABLET(5 MG) BY MOUTH DAILY   atenolol (TENORMIN) 50 MG tablet Take 1 tablet (50 mg total) by mouth daily.   atorvastatin (LIPITOR) 10 MG tablet TAKE 1 TABLET(10 MG) BY MOUTH DAILY   Blood Glucose Monitoring Suppl (CONTOUR NEXT MONITOR) w/Device KIT To check blood sugar 4 times daily.   Calcium Carbonate-Vitamin D (OYSTER SHELL CALCIUM 500 + D) 500-125 MG-UNIT TABS Take 1 tablet by mouth daily.    Cholecalciferol (VITAMIN D3) 10 MCG (400 UNIT) tablet Take 400 Units by mouth daily.    Dulaglutide (TRULICITY) 1.5 XB/1.4NW SOPN Inject 1.5 mg into the skin once a week.   furosemide (LASIX) 40 MG tablet Take 1 tablet (40 mg total) by mouth 2 (two) times daily.   glucose blood (CONTOUR NEXT TEST) test strip TEST BLOOD SUGAR ONCE DAILY   Insulin Glargine (TOUJEO SOLOSTAR) 300 UNIT/ML SOPN Inject 54 Units into the skin at bedtime.   Insulin Pen Needle 32G X 4 MM MISC 1 Units by Does not apply route every morning. Pen needles   JARDIANCE 25 MG TABS tablet TAKE 1 TABLET(25 MG) BY MOUTH DAILY   Lancets (ONETOUCH ULTRASOFT) lancets Use as instructed   levonorgestrel (MIRENA) 20 MCG/24HR IUD 1 each by Intrauterine route once.    losartan (COZAAR) 25 MG tablet TAKE 1 TABLET(25 MG) BY MOUTH DAILY   OXYGEN Inhale 2 L into the lungs daily.   No facility-administered encounter medications on file as of 03/26/2021.    Surgical History: Past Surgical History:  Procedure Laterality Date   CESAREAN SECTION     CHOLECYSTECTOMY     DILATION AND CURETTAGE, DIAGNOSTIC / THERAPEUTIC     TONSILLECTOMY AND ADENOIDECTOMY     TOOTH EXTRACTION  01/2016    Medical History: Past Medical History:  Diagnosis Date   Abnormal uterine bleeding (AUB) 11/25/2016   Chronic diastolic CHF (congestive heart failure) ([place])    a. 02/2012 Echo: EF 25-35%, glob HK; b. 06/2016 Echo: EF 55-60%, mild MR, nl PASP.   CKD (chronic kidney disease), stage II  COVID-19    Diabetes mellitus without complication ([place])    Extreme obesity    Hyperlipidemia    Hypertension    Hypoxia    Microcytic anemia    Pneumonia    Pneumonia    Sleep apnea    Status post endometrial ablation 06/20/2016   2015 hysteroscopy/D&C with NovaSure endometrial ablation; pathology-benign endometrial polyps   Thickened endometrium 06/20/2016   Ultrasound-22.3 mm endometrial stripe, heterogenous   Vaginal bleeding    a. 01/2017 s/p hyteroscopy and polypectomy [facility]).   Vitamin D deficiency     Family History: Non contributory to the  present illness  Social History: Social History   Socioeconomic History   Marital status: Single    Spouse name: Not on file   Number of children: Not on file   Years of education: GED   Highest education level: GED or equivalent  Occupational History   Occupation: disability  Tobacco Use   Smoking status: Never   Smokeless tobacco: Never  Vaping Use   Vaping Use: Never used  Substance and Sexual Activity   Alcohol use: Yes    Alcohol/week: 0.0 standard drinks    Comment: 1-2 beers/month   Drug use: No   Sexual activity: Yes    Birth control/protection: None  Other Topics Concern   Not on file  Social History Narrative   The patient's son lives with her.  The patient recently lost an aunt in December 2020 and feels she got C19 related to the amount of family and friends coming in and out to pay respects to her aunt.    Social Determinants of Health   Financial Resource Strain: Low Risk    Difficulty of Paying Living Expenses: Not hard at all  Food Insecurity: No Food Insecurity   Worried About Charity fundraiser in the Last Year: Never true   [place] in the Last Year: Never true  Transportation Needs: No Transportation Needs   Lack of Transportation (Medical): No   Lack of Transportation (Non-Medical): No  Physical Activity: Inactive   Days of Exercise per Week: 0 days   Minutes of Exercise per Session: 0 min  Stress: No Stress Concern Present   Feeling of Stress : Not at all  Social Connections: Not on file  Intimate Partner Violence: Not on file    Vital Signs: Blood pressure 110/68, pulse 71, resp. rate 16, height 5' 9"  (1.753 m), weight (!) 367 lb (166.5 kg), SpO2 96 %. Body mass index is 54.2 kg/m.    Examination: General Appearance: The patient is well-developed, well-nourished, and in no distress. Neck Circumference: 45 cm Skin: Gross inspection of skin unremarkable. Head: normocephalic, no gross deformities. Eyes: no gross deformities  noted. ENT: ears appear grossly normal Neurologic: Alert and oriented. No involuntary movements.    EPWORTH SLEEPINESS SCALE:  Scale:  (0)= no chance of dozing; (1)= slight chance of dozing; (2)= moderate chance of dozing; (3)= high chance of dozing  Chance  Situtation    Sitting and reading: 1    Watching TV: 0    Sitting Inactive in public: 0    As a passenger in car: 1      Lying down to rest: 1    Sitting and talking: 0    Sitting quielty after lunch: 0    In a car, stopped in traffic: 0   TOTAL SCORE:   3 out of 24    SLEEP STUDIES:  05/10/13 Split - AHI  24.1, Low SpO2 82%,  supplemental oxygen @ 1.5 LPM dues to baseline inn low 80s/  AutoBiPAP @ 25/12cmh2o, PS 5 suppliemental oxygen @ 2 lpm baselline sats still tended to run nder 90% and declined to mid 80s during REM at final pressure   CPAP COMPLIANCE DATA:    1 year    30 day  Date Range: 03/22/20 - 03/21/21   02/24/21 - 03/25/21  Average Daily Use: 4:38 hours   5:50 hours     Median Use: 6:44 hou rs    6:15 hours  Compliance for > 4 Hours: 67%   90%  AHI: 1.2 respiratory events per hour   1.4  Days Used: 254/365     28/30 days  Mask Leak: 52 lpm     39.9 lpm  95th Percentile Pressure: Vpap Auto @ 25/12cmH2O, PS  5cmH2O         LABS: No results found for this or any previous visit (from the past 2160 hour(s)).  Radiology: DG Chest 2 View  Result Date: 08/10/2020 CLINICAL DATA:  Cough for 2 weeks. Shortness of breath. Leukocytosis. EXAM: CHEST - 2 VIEW COMPARISON:  06/05/2020 FINDINGS: The heart size and mediastinal contours are within normal limits. Both lungs are clear. The visualized skeletal structures are unremarkable. IMPRESSION: No active cardiopulmonary disease. Electronically Signed   By: Marlaine Hind M.D.   On: 08/10/2020 10:05    No results found.  No results found.    Assessment and Plan: Patient Active Problem List   Diagnosis Date Noted   OSA treated with BiPAP  03/26/2021   Encounter for BiPAP use counseling 03/26/2021   IUD (intrauterine device) in place 12/24/2020   Cough variant asthma vs Upper Airway cough syndrome 10/14/2020   CKD (chronic kidney disease) stage 3, GFR 30-59 ml/min (HCC) 06/06/2020   OSA on CPAP 03/27/2020   History of 2019 novel coronavirus disease (COVID-19) 06/15/2019   Proteinuria due to type 2 diabetes mellitus ([place]) 06/15/2019   Chronic heart failure with preserved ejection fraction ([place]) 10/22/2018   NICM (nonischemic cardiomyopathy) ([place]) 10/22/2018   BMI 50.0-59.9, adult ([place]) 11/18/2017   Type 2 diabetes mellitus treated with insulin ([place]) 04/25/2015   Hyperlipidemia associated with type 2 diabetes mellitus ([place]) 11/10/2014   Morbid obesity ([place]) 11/10/2014   Vitamin D deficiency 11/10/2014   Benign hypertensive heart and kidney disease with CHF and stage 2 chronic kidney disease ([place]) 11/10/2014    1. OSA treated with BiPAP The patient does tolerate PAP and reports  benefit from PAP use. Her mask does not fit well. The patient was reminded how to clean equipment and advised to replace supplies routinely. The patient was also counselled on weight loss. The compliance is poor, due to URI that she was unable to use it on. She uses oxygen thru her bipap and we will set up an overnight oximetry. . The AHI is 1.9.   OSA treated with bipap.   - Pulse oximetry, overnight; Future  2. Encounter for BiPAP use counseling  Counseling: had a lengthy discussion with the patient regarding the importance of PAP therapy in management of the sleep apnea. Patient appears to understand the risk factor reduction and also understands the risks associated with untreated sleep apnea. Patient will try to make a good faith effort to remain compliant with therapy. Also instructed the patient on proper cleaning of the device including the water must be changed daily if possible and use of distilled water is preferred. Patient understands that  the machine should be regularly cleaned with appropriate recommended cleaning solutions that do not damage the PAP machine for example given white vinegar and water rinses. Other methods such as ozone treatment may not be as good as these simple methods to achieve cleaning.   3. Morbid obesity ([place]) Obesity Counseling: Had a lengthy discussion regarding patients BMI and weight issues. Patient was instructed on portion control as well as increased activity. Also discussed caloric restrictions with trying to maintain intake less than 2000 Kcal. Discussions were made in accordance with the 5As of weight management. Simple actions such as not eating late and if able to, taking a walk is suggested.   4. Chronic heart failure with preserved ejection fraction (HCC) Stable. Follows with cardiology.    General Counseling: I have discussed the findings of the evaluation and examination with Lattie Haw.  I have also discussed any further diagnostic evaluation thatmay be needed or ordered today. Soua verbalizes understanding of the findings of todays visit. We also reviewed her medications today and discussed drug interactions and side effects including but not limited excessive drowsiness and altered mental states. We also discussed that there is always a risk not just to her but also people around her. she has been encouraged to call the office with any questions or concerns that should arise related to todays visit.  Orders Placed This Encounter  Procedures   Pulse oximetry, overnight    Standing Status:   Future    Standing Expiration Date:   03/26/2022        I have personally obtained a history, examined the patient, evaluated laboratory and imaging results, formulated the assessment and plan and placed orders. This patient was seen today by Tressie Ellis, PA-C in collaboration with Dr. Devona Konig.   Allyne Gee, MD [facility] Diplomate ABMS Pulmonary Critical Care Medicine and Sleep Medicine

## 2021-03-27 ENCOUNTER — Encounter: Payer: Self-pay | Admitting: Nurse Practitioner

## 2021-03-27 ENCOUNTER — Ambulatory Visit (INDEPENDENT_AMBULATORY_CARE_PROVIDER_SITE_OTHER): Payer: Medicare Other | Admitting: Nurse Practitioner

## 2021-03-27 ENCOUNTER — Other Ambulatory Visit: Payer: Self-pay

## 2021-03-27 VITALS — BP 133/71 | HR 76 | Temp 98.4°F | Ht 69.0 in | Wt 371.0 lb

## 2021-03-27 DIAGNOSIS — Z794 Long term (current) use of insulin: Secondary | ICD-10-CM

## 2021-03-27 DIAGNOSIS — E785 Hyperlipidemia, unspecified: Secondary | ICD-10-CM

## 2021-03-27 DIAGNOSIS — R809 Proteinuria, unspecified: Secondary | ICD-10-CM | POA: Diagnosis not present

## 2021-03-27 DIAGNOSIS — N1831 Chronic kidney disease, stage 3a: Secondary | ICD-10-CM | POA: Diagnosis not present

## 2021-03-27 DIAGNOSIS — I428 Other cardiomyopathies: Secondary | ICD-10-CM

## 2021-03-27 DIAGNOSIS — E538 Deficiency of other specified B group vitamins: Secondary | ICD-10-CM | POA: Diagnosis not present

## 2021-03-27 DIAGNOSIS — E119 Type 2 diabetes mellitus without complications: Secondary | ICD-10-CM | POA: Diagnosis not present

## 2021-03-27 DIAGNOSIS — E1169 Type 2 diabetes mellitus with other specified complication: Secondary | ICD-10-CM

## 2021-03-27 DIAGNOSIS — Z9989 Dependence on other enabling machines and devices: Secondary | ICD-10-CM

## 2021-03-27 DIAGNOSIS — G4733 Obstructive sleep apnea (adult) (pediatric): Secondary | ICD-10-CM

## 2021-03-27 DIAGNOSIS — E559 Vitamin D deficiency, unspecified: Secondary | ICD-10-CM

## 2021-03-27 DIAGNOSIS — I130 Hypertensive heart and chronic kidney disease with heart failure and stage 1 through stage 4 chronic kidney disease, or unspecified chronic kidney disease: Secondary | ICD-10-CM

## 2021-03-27 DIAGNOSIS — I5032 Chronic diastolic (congestive) heart failure: Secondary | ICD-10-CM | POA: Diagnosis not present

## 2021-03-27 DIAGNOSIS — E1129 Type 2 diabetes mellitus with other diabetic kidney complication: Secondary | ICD-10-CM

## 2021-03-27 DIAGNOSIS — Z6841 Body Mass Index (BMI) 40.0 and over, adult: Secondary | ICD-10-CM | POA: Diagnosis not present

## 2021-03-27 DIAGNOSIS — N182 Chronic kidney disease, stage 2 (mild): Secondary | ICD-10-CM | POA: Diagnosis not present

## 2021-03-27 LAB — MICROALBUMIN, URINE WAIVED
Creatinine, Urine Waived: 200 mg/dL (ref 10–300)
Microalb, Ur Waived: 80 mg/L — ABNORMAL HIGH (ref 0–19)

## 2021-03-27 LAB — BAYER DCA HB A1C WAIVED: HB A1C (BAYER DCA - WAIVED): 7.1 % — ABNORMAL HIGH (ref 4.8–5.6)

## 2021-03-27 NOTE — Assessment & Plan Note (Signed)
Refer to morbid obesity.

## 2021-03-27 NOTE — Assessment & Plan Note (Signed)
Chronic, ongoing.  Followed by endo. Refer to type 2 diabetes mellitus care plan.

## 2021-03-27 NOTE — Assessment & Plan Note (Addendum)
Chronic, ongoing, at goal BP 133/71 today.  Continue current medication regimen and collaboration with cardiology.  Also recommend checking BP three mornings a week at home + documenting for providers. Education and recommendation provided today on DASH diet focus and avoidance of high sodium foods, and cutting back on pork skins, which patient states is difficult to do. Suggest she starts by pre-portions the chips to help reducing intake amount and try to gradually stop eating them. CMP and CBC checked today. Return in July 2023.

## 2021-03-27 NOTE — Assessment & Plan Note (Signed)
Improving recent labs, GFR 63 on 12/25/20. CMP checked today. Recommend continue ARB for kidney protection.  Consider nephrology referral in future if worsening.

## 2021-03-27 NOTE — Assessment & Plan Note (Signed)
BMI 54.79 today with diabetes, HTN, HF.  Recommended eating smaller high protein, low fat meals more frequently and exercising 30 mins a day 5 times a week with a goal of 10-15lb weight loss in the next 3 months. Patient voiced their understanding and motivation to adhere to these recommendations.

## 2021-03-27 NOTE — Assessment & Plan Note (Signed)
Vitamin B12 level checked today

## 2021-03-27 NOTE — Assessment & Plan Note (Signed)
Chronic, ongoing.  Continue current medication regimen as is at goal.  Lipid panel checked today.

## 2021-03-27 NOTE — Assessment & Plan Note (Signed)
Chronic, ongoing.  Continue collaboration with cardiology and current medication regimen as prescribed by them.   

## 2021-03-27 NOTE — Assessment & Plan Note (Signed)
Vit D level checked today and continue supplement daily.

## 2021-03-27 NOTE — Progress Notes (Signed)
BP 133/71    Pulse 76    Temp 98.4 F (36.9 C) (Oral)    Ht 5\' 9"  (1.753 m)    Wt (!) 371 lb (168.3 kg)    SpO2 94%    BMI 54.79 kg/m    Subjective:    Patient ID: Sara Zhang, female    DOB: 10/04/1970, 51 y.o.   MRN: [id number]  NOTE WRITTEN BY [employer] DNP STUDENT.  ASSESSMENT AND PLAN OF CARE REVIEWED WITH STUDENT, AGREE WITH ABOVE FINDINGS AND PLAN.   HPI: Sara Zhang is a 51 y.o. female here today for follow up on T2DM, HTN/HLD, HF and CKD. She does not have any additional concern.  Chief Complaint  Patient presents with   Diabetes   Hyperlipidemia   Hypertension   Congestive Heart Failure   Chronic Kidney Disease   DIABETES Current medications include Jardiance 25 MG, Toujeo 54 units QHS, and Bydureon 2 MG weekly.  A1C last in November 7.3%.  Last saw endocrinology 02/08/21, no changes made on review of note -- she returns 08/13/21.   She does endorse poor diet -- eating a little more junk.  She is not a big breakfast eater, may snack on chips. Polydipsia/polyuria: no Visual disturbance: no Chest pain: no Paresthesias: no Glucose Monitoring: yes             Accucheck frequency:              Fasting glucose: 60's and up             Evening: in the 200s range around 2-3 times a week             Before meals: Taking Insulin?: yes             Long acting insulin: 54 units             Short acting insulin: Blood Pressure Monitoring: not checking Retinal Examination: Up to Date -- last month [facility], Dr. Wallace Going Foot Exam: Up to Date Pneumovax: Up to Date Influenza: Not Up to Date -- refuses Aspirin: no    HYPERTENSION / HYPERLIPIDEMIA/HF Current medications include Losartan (no Lisinopril due to cough), Furosemide, Amlodipine, Atenolol for HTN + Lipitor for HLD.    Saw Dr. Melvyn Novas with pulmonary last in 11/28/20 -- continues to use CPAP.  Followed by cardiology and last saw Dr. Saunders Revel 03/14/21, she is to take her Lasix BID, which she was not, due to edema.  Last  echo in 2020 October was 55-60%. Satisfied with current treatment? yes Duration of hypertension: chronic BP monitoring frequency: not checking BP range:  BP medication side effects: no Duration of hyperlipidemia: chronic Cholesterol medication side effects: no Cholesterol supplements: none Medication compliance: good compliance Aspirin: no Recent stressors: no Recurrent headaches: no Visual changes: no Palpitations: no Dyspnea: none recently Chest pain: no Lower extremity edema: no Dizzy/lightheaded: no  CHRONIC KIDNEY DISEASE Recent kidney function stable. CKD status: stable Medications renally dose: yes Previous renal evaluation: no Pneumovax:  Up to Date Influenza Vaccine:  Not up to Date    Relevant past medical, surgical, family and social history reviewed and updated as indicated. Interim medical history since our last visit reviewed. Allergies and medications reviewed and updated.  Review of Systems  Constitutional:  Negative for chills, fever, malaise/fatigue and weight loss.  HENT:  Negative for tinnitus.   Eyes:  Negative for blurred vision and double vision.  Respiratory:  Negative for cough, hemoptysis, sputum production  and shortness of breath.   Cardiovascular:  Positive for leg swelling. Negative for chest pain and palpitations.       States swelling is improving  Gastrointestinal:  Negative for constipation, diarrhea, heartburn, nausea and vomiting.  Genitourinary:  Negative for dysuria, frequency and urgency.  Musculoskeletal:  Negative for falls and myalgias.  Skin:  Negative for rash.  Neurological:  Negative for dizziness, tingling, sensory change, weakness and headaches.  Endo/Heme/Allergies:  Negative for polydipsia.  Psychiatric/Behavioral: Negative.      Per HPI unless specifically indicated above     Objective:    BP 133/71    Pulse 76    Temp 98.4 F (36.9 C) (Oral)    Ht 5\' 9"  (1.753 m)    Wt (!) 371 lb (168.3 kg)    SpO2 94%    BMI 54.79  kg/m   Wt Readings from Last 3 Encounters:  03/27/21 (!) 371 lb (168.3 kg)  03/26/21 (!) 367 lb (166.5 kg)  03/14/21 (!) 371 lb (168.3 kg)    Physical Exam Vitals and nursing note reviewed.  Constitutional:      General: She is awake. She is not in acute distress.    Appearance: She is well-developed and well-groomed. She is morbidly obese. She is not ill-appearing.  HENT:     Head: Normocephalic.     Right Ear: Hearing normal.     Left Ear: Hearing normal.  Eyes:     General: Lids are normal. No scleral icterus.       Right eye: No discharge.        Left eye: No discharge.     Conjunctiva/sclera: Conjunctivae normal.     Pupils: Pupils are equal, round, and reactive to light.  Neck:     Thyroid: No thyromegaly.     Vascular: No carotid bruit.  Cardiovascular:     Rate and Rhythm: Normal rate and regular rhythm.     Pulses:          Dorsalis pedis pulses are 1+ on the right side and 1+ on the left side.     Heart sounds: Normal heart sounds. No murmur heard.   No gallop.  Pulmonary:     Effort: Pulmonary effort is normal. No accessory muscle usage or respiratory distress.     Breath sounds: Normal breath sounds. No wheezing or rhonchi.     Comments: Lungs clear throughout. Abdominal:     General: Abdomen is protuberant. Bowel sounds are normal.     Palpations: Abdomen is soft.  Musculoskeletal:     Cervical back: Normal range of motion and neck supple.     Right lower leg: Edema (trace) present.     Left lower leg: Edema (trace) present.  Skin:    General: Skin is warm and dry.     Capillary Refill: Capillary refill takes less than 2 seconds.  Neurological:     Mental Status: She is alert and oriented to person, place, and time.  Psychiatric:        Attention and Perception: Attention normal.        Mood and Affect: Mood and affect normal.        Speech: Speech normal.        Behavior: Behavior normal. Behavior is cooperative.        Thought Content: Thought  content normal.   Results for orders placed or performed in visit on 03/27/21  Bayer DCA Hb A1c Waived  Result Value Ref Range  HB A1C (BAYER DCA - WAIVED) 7.1 (H) 4.8 - 5.6 %  Microalbumin, Urine Waived  Result Value Ref Range   Microalb, Ur Waived 80 (H) 0 - 19 mg/L   Creatinine, Urine Waived 200 10 - 300 mg/dL   Microalb/Creat Ratio 30-300 (H) <30 mg/g      Assessment & Plan:   Problem List Items Addressed This Visit       Cardiovascular and Mediastinum   Benign hypertensive heart and kidney disease with CHF and stage 2 chronic kidney disease (HCC)    Chronic, ongoing, at goal BP 133/71 today.  Continue current medication regimen and collaboration with cardiology.  Also recommend checking BP three mornings a week at home + documenting for providers. Education and recommendation provided today on DASH diet focus and avoidance of high sodium foods, and cutting back on pork skins, which patient states is difficult to do. Suggest she starts by pre-portions the chips to help reducing intake amount and try to gradually stop eating them. CMP and CBC checked today. Return in July 2023.      Relevant Orders   CBC with Differential/Platelet   Comprehensive metabolic panel   Chronic heart failure with preserved ejection fraction (HCC)    Chronic, ongoing with BP at goal 133/71 today.  Continue current medication regimen and collaboration with cardiology. Recommend to continue to cut back on pork skins and: - Reminded to call for an overnight weight gain of >2 pounds or a weekly weight weight of >5 pounds - not adding salt to his food and has been reading food labels. Reviewed the importance of keeping daily sodium intake to 2000mg  daily -- NO PORK SKINS - Avoid Ibuprofen      Relevant Orders   CBC with Differential/Platelet   Comprehensive metabolic panel   NICM (nonischemic cardiomyopathy) (HCC)    Chronic, ongoing.  Continue collaboration with cardiology and current medication  regimen as prescribed by them.      Relevant Orders   Comprehensive metabolic panel   Lipid Panel w/o Chol/HDL Ratio     Respiratory   OSA on CPAP    Chronic, ongoing.  Continue current use of O2 at night. She has no concerns today.        Endocrine   Hyperlipidemia associated with type 2 diabetes mellitus (HCC)    Chronic, ongoing.  Continue current medication regimen as is at goal.  Lipid panel checked today.      Relevant Orders   Bayer DCA Hb A1c Waived (Completed)   Comprehensive metabolic panel   Lipid Panel w/o Chol/HDL Ratio   Proteinuria due to type 2 diabetes mellitus (HCC)    Chronic, ongoing.  Followed by endo. Refer to type 2 diabetes mellitus care plan.       Relevant Orders   Bayer DCA Hb A1c Waived (Completed)   Microalbumin, Urine Waived (Completed)   Comprehensive metabolic panel   Type 2 diabetes mellitus treated with insulin (HCC) - Primary    Chronic, ongoing.  Followed by endo.  A1C slightly decrease to 7.1% today from 7.3% on last visit. Urine ALB remains at 80.  Continue collaboration with endocrinology.  Continue current medication regimen and increase as needed -- could consider switch to [facility] in future, Losartan offering kidney protection.  Recommend she continue to monitor BS at home 2-3 times a day.  Refills as needed.  Return in July 2023.      Relevant Orders   Bayer DCA Hb A1c Waived (Completed)  Microalbumin, Urine Waived (Completed)     Genitourinary   CKD (chronic kidney disease) stage 3, GFR 30-59 ml/min (HCC)    Improving recent labs, GFR 63 on 12/25/20. CMP checked today. Recommend continue ARB for kidney protection.  Consider nephrology referral in future if worsening.      Relevant Orders   Microalbumin, Urine Waived (Completed)   CBC with Differential/Platelet   Comprehensive metabolic panel     Other   B12 deficiency    Vitamin B12 level checked today      Relevant Orders   Vitamin B12   BMI 50.0-59.9, adult ([facility])     Refer to morbid obesity.      Morbid obesity (HCC)    BMI 54.79 today with diabetes, HTN, HF.  Recommended eating smaller high protein, low fat meals more frequently and exercising 30 mins a day 5 times a week with a goal of 10-15lb weight loss in the next 3 months. Patient voiced their understanding and motivation to adhere to these recommendations.       Vitamin D deficiency    Vit D level checked today and continue supplement daily.      Relevant Orders   VITAMIN D 25 Hydroxy (Vit-D Deficiency, Fractures)     Follow up plan: Return in about 5 months (around 08/20/2021) for T2DM, HTN/HLD, HF, CKD -- health maintenance check.

## 2021-03-27 NOTE — Assessment & Plan Note (Signed)
Chronic, ongoing.  Continue current use of O2 at night. She has no concerns today. 

## 2021-03-27 NOTE — Assessment & Plan Note (Signed)
Chronic, ongoing with BP at goal 133/71 today.  Continue current medication regimen and collaboration with cardiology. Recommend to continue to cut back on pork skins and: - Reminded to call for an overnight weight gain of >2 pounds or a weekly weight weight of >5 pounds - not adding salt to his food and has been reading food labels. Reviewed the importance of keeping daily sodium intake to 2000mg  daily -- NO PORK SKINS - Avoid Ibuprofen

## 2021-03-27 NOTE — Assessment & Plan Note (Signed)
Chronic, ongoing.  Followed by endo.  A1C slightly decrease to 7.1% today from 7.3% on last visit. Urine ALB remains at 80.  Continue collaboration with endocrinology.  Continue current medication regimen and increase as needed -- could consider switch to [facility] in future, Losartan offering kidney protection.  Recommend she continue to monitor BS at home 2-3 times a day.  Refills as needed.  Return in July 2023.

## 2021-03-28 ENCOUNTER — Other Ambulatory Visit: Payer: Self-pay | Admitting: Nurse Practitioner

## 2021-03-28 DIAGNOSIS — E870 Hyperosmolality and hypernatremia: Secondary | ICD-10-CM

## 2021-03-28 DIAGNOSIS — E875 Hyperkalemia: Secondary | ICD-10-CM

## 2021-03-28 LAB — CBC WITH DIFFERENTIAL/PLATELET
Basophils Absolute: 0.1 10*3/uL (ref 0.0–0.2)
Basos: 1 %
EOS (ABSOLUTE): 0.4 10*3/uL (ref 0.0–0.4)
Eos: 6 %
Hematocrit: 41.1 % (ref 34.0–46.6)
Hemoglobin: 12.6 g/dL (ref 11.1–15.9)
Immature Grans (Abs): 0 10*3/uL (ref 0.0–0.1)
Immature Granulocytes: 0 %
Lymphocytes Absolute: 2.2 10*3/uL (ref 0.7–3.1)
Lymphs: 28 %
MCH: 26.2 pg — ABNORMAL LOW (ref 26.6–33.0)
MCHC: 30.7 g/dL — ABNORMAL LOW (ref 31.5–35.7)
MCV: 85 fL (ref 79–97)
Monocytes Absolute: 0.8 10*3/uL (ref 0.1–0.9)
Monocytes: 10 %
Neutrophils Absolute: 4.5 10*3/uL (ref 1.4–7.0)
Neutrophils: 55 %
Platelets: 341 10*3/uL (ref 150–450)
RBC: 4.81 x10E6/uL (ref 3.77–5.28)
RDW: 14.2 % (ref 11.7–15.4)
WBC: 8 10*3/uL (ref 3.4–10.8)

## 2021-03-28 LAB — COMPREHENSIVE METABOLIC PANEL
ALT: 12 IU/L (ref 0–32)
AST: 10 IU/L (ref 0–40)
Albumin/Globulin Ratio: 1.4 (ref 1.2–2.2)
Albumin: 4.1 g/dL (ref 3.8–4.9)
Alkaline Phosphatase: 125 IU/L — ABNORMAL HIGH (ref 44–121)
BUN/Creatinine Ratio: 17 (ref 9–23)
BUN: 19 mg/dL (ref 6–24)
Bilirubin Total: 0.3 mg/dL (ref 0.0–1.2)
CO2: 26 mmol/L (ref 20–29)
Calcium: 8.4 mg/dL — ABNORMAL LOW (ref 8.7–10.2)
Chloride: 103 mmol/L (ref 96–106)
Creatinine, Ser: 1.15 mg/dL — ABNORMAL HIGH (ref 0.57–1.00)
Globulin, Total: 2.9 g/dL (ref 1.5–4.5)
Glucose: 144 mg/dL — ABNORMAL HIGH (ref 70–99)
Potassium: 5.3 mmol/L — ABNORMAL HIGH (ref 3.5–5.2)
Sodium: 146 mmol/L — ABNORMAL HIGH (ref 134–144)
Total Protein: 7 g/dL (ref 6.0–8.5)
eGFR: 57 mL/min/{1.73_m2} — ABNORMAL LOW (ref >59)

## 2021-03-28 LAB — LIPID PANEL W/O CHOL/HDL RATIO
Cholesterol, Total: 124 mg/dL (ref 100–199)
HDL: 45 mg/dL (ref >39)
LDL Chol Calc (NIH): 67 mg/dL (ref 0–99)
Triglycerides: 53 mg/dL (ref 0–149)
VLDL Cholesterol Cal: 12 mg/dL (ref 5–40)

## 2021-03-28 LAB — VITAMIN D 25 HYDROXY (VIT D DEFICIENCY, FRACTURES): Vit D, 25-Hydroxy: 48.5 ng/mL (ref 30.0–100.0)

## 2021-03-28 LAB — VITAMIN B12: Vitamin B-12: 541 pg/mL (ref 232–1245)

## 2021-03-28 NOTE — Progress Notes (Signed)
Contacted via MyChart == needs 2 week lab only visit please   Good afternoon Sara Zhang, your labs have returned: - CBC shows no anemia or infection. - CMP shows mild elevation in sodium (salt) and potassium + some mild kidney disease.  I would like you to cut back on salt intake and potassium intake -- so foods high in salt (pork skins) and high in potassium (mangoes, bananas, nuts, dried fruit).  Hold any multivitamin for now.  Drink a little more water.  I would like you to return in 2 weeks for lab only visit to recheck. - Remainder of labs all stable.  Any questions? Keep being amazing!!  Thank you for allowing me to participate in your care.  I appreciate you. Kindest regards, Ozzy Bohlken

## 2021-04-06 ENCOUNTER — Ambulatory Visit (INDEPENDENT_AMBULATORY_CARE_PROVIDER_SITE_OTHER): Payer: Medicare Other | Admitting: Nurse Practitioner

## 2021-04-06 ENCOUNTER — Encounter: Payer: Self-pay | Admitting: Nurse Practitioner

## 2021-04-06 ENCOUNTER — Other Ambulatory Visit: Payer: Self-pay

## 2021-04-06 ENCOUNTER — Ambulatory Visit
Admission: RE | Admit: 2021-04-06 | Discharge: 2021-04-06 | Disposition: A | Discharge status: Home / Self Care | Payer: Medicare Other | Source: Ambulatory Visit | Attending: Nurse Practitioner | Admitting: Nurse Practitioner

## 2021-04-06 ENCOUNTER — Ambulatory Visit
Admission: RE | Admit: 2021-04-06 | Discharge: 2021-04-06 | Disposition: A | Discharge status: Home / Self Care | Payer: Medicare Other | Attending: Nurse Practitioner | Admitting: Nurse Practitioner

## 2021-04-06 VITALS — BP 130/78 | HR 72 | Temp 98.2°F | Wt 371.0 lb

## 2021-04-06 DIAGNOSIS — R051 Acute cough: Secondary | ICD-10-CM

## 2021-04-06 DIAGNOSIS — R059 Cough, unspecified: Secondary | ICD-10-CM | POA: Diagnosis not present

## 2021-04-06 DIAGNOSIS — R0602 Shortness of breath: Secondary | ICD-10-CM | POA: Diagnosis not present

## 2021-04-06 DIAGNOSIS — I5032 Chronic diastolic (congestive) heart failure: Secondary | ICD-10-CM | POA: Diagnosis not present

## 2021-04-06 MED ORDER — ALBUTEROL SULFATE HFA 108 (90 BASE) MCG/ACT IN AERS: 2.0000 | INHALATION_SPRAY | Freq: Four times a day (QID) | RESPIRATORY_TRACT | 2 refills | Status: DC | PRN

## 2021-04-06 MED ORDER — AMOXICILLIN-POT CLAVULANATE 875-125 MG PO TABS: 1.0000 | ORAL_TABLET | Freq: Two times a day (BID) | ORAL | 0 refills | Status: AC

## 2021-04-06 NOTE — Assessment & Plan Note (Signed)
Acute for 5 days, ?HF exacerbation vs URI vs PNA.  At this time obtain Covid testing. Recommend self quarantine until this returns.  Avoid Alka Seltzer Plus, take Coricidin or Diabetic Tussin OTC only.  Will obtain imaging and start Augmentin x 7 days and Albuterol as needed.  Recommend: ?- Increased rest ?- Increasing Fluids ?- Acetaminophen as needed for fever/pain.  ?- Salt water gargling, chloraseptic spray and throat lozenges ?Return on Monday for follow-up.  Go immediately to ER over weekend if worsening. ?

## 2021-04-06 NOTE — Assessment & Plan Note (Signed)
Chronic, ongoing with BP at goal.  Concern for acute exacerbation due to recent increased salt intake.  Increase Lasix for next 4 days with 60 MG in morning and 40 MG at night.  Continue remainder of current medication regimen and collaboration with cardiology.  Recommend cut back on salt intake immediately.  Recommend: ?- Reminded to call for an overnight weight gain of >2 pounds or a weekly weight weight of >5 pounds ?- not adding salt to his food and has been reading food labels. Reviewed the importance of keeping daily sodium intake to 2000mg  daily -- NO PORK SKINS ?- Avoid Ibuprofen ?Return on Monday for follow-up. ?

## 2021-04-06 NOTE — Patient Instructions (Addendum)
For next 3 days take 60 MG of Lasix (1 1/2 tablets) in morning and 40 MG (1 tablet) at night. ? ?Do not take anything by Coricidin for cough (this will not cause elevation in blood pressure) + may take Diabetic Tussin.   ? ?Cough, Adult ?A cough helps to clear your throat and lungs. A cough may be a sign of an illness or another medical condition. ?An acute cough may only last 2-3 weeks, while a chronic cough may last 8 or more weeks. ?Many things can cause a cough. They include: ?Germs (viruses or bacteria) that attack the airway. ?Breathing in things that bother (irritate) your lungs. ?Allergies. ?Asthma. ?Mucus that runs down the back of your throat (postnasal drip). ?Smoking. ?Acid backing up from the stomach into the tube that moves food from the mouth to the stomach (gastroesophageal reflux). ?Some medicines. ?Lung problems. ?Other medical conditions, such as heart failure or a blood clot in the lung (pulmonary embolism). ?Follow these instructions at home: ?Medicines ?Take over-the-counter and prescription medicines only as told by your doctor. ?Talk with your doctor before you take medicines that stop a cough (cough suppressants). ?Lifestyle ? ?Do not smoke, and try not to be around smoke. Do not use any products that contain nicotine or tobacco, such as cigarettes, e-cigarettes, and chewing tobacco. If you need help quitting, ask your doctor. ?Drink enough fluid to keep your pee (urine) pale yellow. ?Avoid caffeine. ?Do not drink alcohol if your doctor tells you not to drink. ?General instructions ? ?Watch for any changes in your cough. Tell your doctor about them. ?Always cover your mouth when you cough. ?Stay away from things that make you cough, such as perfume, candles, campfire smoke, or cleaning products. ?If the air is dry, use a cool mist vaporizer or humidifier in your home. ?If your cough is worse at night, try using extra pillows to raise your head up higher while you sleep. ?Rest as  needed. ?Keep all follow-up visits as told by your doctor. This is important. ?Contact a doctor if: ?You have new symptoms. ?You cough up pus. ?Your cough does not get better after 2-3 weeks, or your cough gets worse. ?Cough medicine does not help your cough and you are not sleeping well. ?You have pain that gets worse or pain that is not helped with medicine. ?You have a fever. ?You are losing weight and you do not know why. ?You have night sweats. ?Get help right away if: ?You cough up blood. ?You have trouble breathing. ?Your heartbeat is very fast. ?These symptoms may be an emergency. Do not wait to see if the symptoms will go away. Get medical help right away. Call your local emergency services (911 in the [place] Do not drive yourself to the hospital. ?Summary ?A cough helps to clear your throat and lungs. Many things can cause a cough. ?Take over-the-counter and prescription medicines only as told by your doctor. ?Always cover your mouth when you cough. ?Contact a doctor if you have new symptoms or you have a cough that does not get better or gets worse. ?This information is not intended to replace advice given to you by your health care provider. Make sure you discuss any questions you have with your health care provider. ?Document Revised: 03/12/2019 Document Reviewed: 02/09/2018 ?Elsevier Patient Education ? 2022 [employer] ? ?

## 2021-04-06 NOTE — Progress Notes (Signed)
BP 130/78 (BP Location: Left Arm, Patient Position: Sitting)    Pulse 72    Temp 98.2 F (36.8 C) (Oral)    Wt (!) 371 lb (168.3 kg)    SpO2 94%    BMI 54.79 kg/m    Subjective:    Patient ID: Sara Zhang, female    DOB: 1971-01-15, 51 y.o.   MRN: [id number]  HPI: Sara Zhang is a 51 y.o. female  Chief Complaint  Patient presents with   Cough   Pneumonia    Patient states she may be having another episode of pneumonia. Patient states it is the same symptoms as she had before when she was diagnosed with pneumonia. Patient states she has tried over the counter medications since Monday and she states nothing seems to be helping her. Patient states she is coughing up phlegm and when she blows her nose she notices blood.    UPPER RESPIRATORY TRACT INFECTION Started feeling bad on Sunday evening, 03/31/21 -- recently also had a little cold.  Coughing and mucus + lots of rattling.  Does endorse a little more salt intake daily, but is cutting back as noticed foot swelling more.  Currently taking her Lasix 40 MG BID.   Worst symptom: cough Fever: none Cough: yes Shortness of breath: yes Wheezing: yes Chest pain: no Chest tightness: yes Chest congestion: yes Nasal congestion: yes Runny nose: yes Post nasal drip: no Sneezing: no Sore throat: no Swollen glands: no Sinus pressure: no Headache: no Face pain: no Toothache: no Ear pain: none Ear pressure: none Eyes red/itching:no Eye drainage/crusting: no  Vomiting: no Rash: no Fatigue: yes Sick contacts: no Strep contacts: no  Context: fluctuating Recurrent sinusitis: no Relief with OTC cold/cough medications: yes  Treatments attempted: Alka Seltzer + Cold and Delsym    Relevant past medical, surgical, family and social history reviewed and updated as indicated. Interim medical history since our last visit reviewed. Allergies and medications reviewed and updated.  Review of Systems  Constitutional:  Positive for  fatigue. Negative for activity change, appetite change, chills and fever.  HENT:  Positive for congestion, postnasal drip and rhinorrhea. Negative for ear discharge, ear pain, facial swelling, sinus pressure, sinus pain, sneezing, sore throat and voice change.   Eyes:  Negative for pain and visual disturbance.  Respiratory:  Positive for cough, chest tightness, shortness of breath and wheezing.   Cardiovascular:  Positive for leg swelling. Negative for chest pain and palpitations.  Gastrointestinal: Negative.   Endocrine: Negative.   Musculoskeletal:  Positive for myalgias.  Neurological: Negative.   Psychiatric/Behavioral: Negative.     Per HPI unless specifically indicated above     Objective:    BP 130/78 (BP Location: Left Arm, Patient Position: Sitting)    Pulse 72    Temp 98.2 F (36.8 C) (Oral)    Wt (!) 371 lb (168.3 kg)    SpO2 94%    BMI 54.79 kg/m   Wt Readings from Last 3 Encounters:  04/06/21 (!) 371 lb (168.3 kg)  03/27/21 (!) 371 lb (168.3 kg)  03/26/21 (!) 367 lb (166.5 kg)    Physical Exam Vitals and nursing note reviewed.  Constitutional:      General: She is awake. She is not in acute distress.    Appearance: She is well-developed. She is morbidly obese. She is not ill-appearing.  HENT:     Head: Normocephalic.     Right Ear: Hearing, tympanic membrane, ear canal and external ear  normal.     Left Ear: Hearing, tympanic membrane, ear canal and external ear normal.     Nose: Rhinorrhea present. Rhinorrhea is clear.     Right Sinus: No maxillary sinus tenderness or frontal sinus tenderness.     Left Sinus: No maxillary sinus tenderness or frontal sinus tenderness.     Mouth/Throat:     Mouth: Mucous membranes are moist.     Pharynx: Oropharynx is clear.  Eyes:     General: Lids are normal.        Right eye: No discharge.        Left eye: No discharge.     Conjunctiva/sclera: Conjunctivae normal.     Pupils: Pupils are equal, round, and reactive to light.   Neck:     Thyroid: No thyromegaly.     Vascular: No carotid bruit.  Cardiovascular:     Rate and Rhythm: Normal rate and regular rhythm.     Heart sounds: Normal heart sounds. No murmur heard.   No gallop.  Pulmonary:     Effort: Pulmonary effort is normal. No accessory muscle usage or respiratory distress.     Breath sounds: Wheezing and rhonchi present. No decreased breath sounds.     Comments: Intermittent expiratory wheezes throughout with rhonchi noted to lower bases. Abdominal:     General: Bowel sounds are normal. There is no distension.     Palpations: Abdomen is soft.     Tenderness: There is no abdominal tenderness.  Musculoskeletal:     Cervical back: Normal range of motion and neck supple.     Right lower leg: 1+ Pitting Edema present.     Left lower leg: 1+ Pitting Edema present.  Lymphadenopathy:     Cervical: No cervical adenopathy.  Skin:    General: Skin is warm and dry.  Neurological:     Mental Status: She is alert and oriented to person, place, and time.  Psychiatric:        Attention and Perception: Attention normal.        Mood and Affect: Mood normal.        Speech: Speech normal.        Behavior: Behavior normal. Behavior is cooperative.        Thought Content: Thought content normal.    Results for orders placed or performed in visit on 03/27/21  Bayer DCA Hb A1c Waived  Result Value Ref Range   HB A1C (BAYER DCA - WAIVED) 7.1 (H) 4.8 - 5.6 %  Microalbumin, Urine Waived  Result Value Ref Range   Microalb, Ur Waived 80 (H) 0 - 19 mg/L   Creatinine, Urine Waived 200 10 - 300 mg/dL   Microalb/Creat Ratio 30-300 (H) <30 mg/g  CBC with Differential/Platelet  Result Value Ref Range   WBC 8.0 3.4 - 10.8 x10E3/uL   RBC 4.81 3.77 - 5.28 x10E6/uL   Hemoglobin 12.6 11.1 - 15.9 g/dL   Hematocrit 41.1 34.0 - 46.6 %   MCV 85 79 - 97 fL   MCH 26.2 (L) 26.6 - 33.0 pg   MCHC 30.7 (L) 31.5 - 35.7 g/dL   RDW 14.2 11.7 - 15.4 %   Platelets 341 150 - 450  x10E3/uL   Neutrophils 55 Not Estab. %   Lymphs 28 Not Estab. %   Monocytes 10 Not Estab. %   Eos 6 Not Estab. %   Basos 1 Not Estab. %   Neutrophils Absolute 4.5 1.4 - 7.0 x10E3/uL   Lymphocytes Absolute  2.2 0.7 - 3.1 x10E3/uL   Monocytes Absolute 0.8 0.1 - 0.9 x10E3/uL   EOS (ABSOLUTE) 0.4 0.0 - 0.4 x10E3/uL   Basophils Absolute 0.1 0.0 - 0.2 x10E3/uL   Immature Granulocytes 0 Not Estab. %   Immature Grans (Abs) 0.0 0.0 - 0.1 x10E3/uL  Comprehensive metabolic panel  Result Value Ref Range   Glucose 144 (H) 70 - 99 mg/dL   BUN 19 6 - 24 mg/dL   Creatinine, Ser 1.15 (H) 0.57 - 1.00 mg/dL   eGFR 57 (L) >59 mL/min/1.73   BUN/Creatinine Ratio 17 9 - 23   Sodium 146 (H) 134 - 144 mmol/L   Potassium 5.3 (H) 3.5 - 5.2 mmol/L   Chloride 103 96 - 106 mmol/L   CO2 26 20 - 29 mmol/L   Calcium 8.4 (L) 8.7 - 10.2 mg/dL   Total Protein 7.0 6.0 - 8.5 g/dL   Albumin 4.1 3.8 - 4.9 g/dL   Globulin, Total 2.9 1.5 - 4.5 g/dL   Albumin/Globulin Ratio 1.4 1.2 - 2.2   Bilirubin Total 0.3 0.0 - 1.2 mg/dL   Alkaline Phosphatase 125 (H) 44 - 121 IU/L   AST 10 0 - 40 IU/L   ALT 12 0 - 32 IU/L  Lipid Panel w/o Chol/HDL Ratio  Result Value Ref Range   Cholesterol, Total 124 100 - 199 mg/dL   Triglycerides 53 0 - 149 mg/dL   HDL 45 >39 mg/dL   VLDL Cholesterol Cal 12 5 - 40 mg/dL   LDL Chol Calc (NIH) 67 0 - 99 mg/dL  VITAMIN D 25 Hydroxy (Vit-D Deficiency, Fractures)  Result Value Ref Range   Vit D, 25-Hydroxy 48.5 30.0 - 100.0 ng/mL  Vitamin B12  Result Value Ref Range   Vitamin B-12 541 232 - 1,245 pg/mL      Assessment & Plan:   Problem List Items Addressed This Visit       Cardiovascular and Mediastinum   Chronic heart failure with preserved ejection fraction (HCC) - Primary    Chronic, ongoing with BP at goal.  Concern for acute exacerbation due to recent increased salt intake.  Increase Lasix for next 4 days with 60 MG in morning and 40 MG at night.  Continue remainder of current  medication regimen and collaboration with cardiology.  Recommend cut back on salt intake immediately.  Recommend: - Reminded to call for an overnight weight gain of >2 pounds or a weekly weight weight of >5 pounds - not adding salt to his food and has been reading food labels. Reviewed the importance of keeping daily sodium intake to <2075m daily -- NO PORK SKINS - Avoid Ibuprofen Return on Monday for follow-up.      Relevant Orders   B Nat Peptide     Other   Acute cough    Acute for 5 days, ?HF exacerbation vs URI vs PNA.  At this time obtain Covid testing. Recommend self quarantine until this returns.  Avoid Alka Seltzer Plus, take Coricidin or Diabetic Tussin OTC only.  Will obtain imaging and start Augmentin x 7 days and Albuterol as needed.  Recommend: - Increased rest - Increasing Fluids - Acetaminophen as needed for fever/pain.  - Salt water gargling, chloraseptic spray and throat lozenges Return on Monday for follow-up.  Go immediately to ER over weekend if worsening.      Relevant Orders   DG Chest 2 View   Novel Coronavirus, NAA ([facility])   B Nat Peptide   CBC with Differential/Platelet  Comp Met (CMET)     Follow up plan: Return in about 3 days (around 04/09/2021) for Cough.

## 2021-04-07 LAB — COMPREHENSIVE METABOLIC PANEL
ALT: 15 IU/L (ref 0–32)
AST: 18 IU/L (ref 0–40)
Albumin/Globulin Ratio: 1.3 (ref 1.2–2.2)
Albumin: 3.6 g/dL — ABNORMAL LOW (ref 3.8–4.9)
Alkaline Phosphatase: 97 IU/L (ref 44–121)
BUN/Creatinine Ratio: 16 (ref 9–23)
BUN: 16 mg/dL (ref 6–24)
Bilirubin Total: 0.2 mg/dL (ref 0.0–1.2)
CO2: 26 mmol/L (ref 20–29)
Calcium: 7.7 mg/dL — ABNORMAL LOW (ref 8.7–10.2)
Chloride: 100 mmol/L (ref 96–106)
Creatinine, Ser: 1.02 mg/dL — ABNORMAL HIGH (ref 0.57–1.00)
Globulin, Total: 2.8 g/dL (ref 1.5–4.5)
Glucose: 236 mg/dL — ABNORMAL HIGH (ref 70–99)
Potassium: 4.6 mmol/L (ref 3.5–5.2)
Sodium: 142 mmol/L (ref 134–144)
Total Protein: 6.4 g/dL (ref 6.0–8.5)
eGFR: 66 mL/min/{1.73_m2} (ref >59)

## 2021-04-07 LAB — CBC WITH DIFFERENTIAL/PLATELET
Basophils Absolute: 0.1 10*3/uL (ref 0.0–0.2)
Basos: 1 %
EOS (ABSOLUTE): 0.4 10*3/uL (ref 0.0–0.4)
Eos: 7 %
Hematocrit: 40.6 % (ref 34.0–46.6)
Hemoglobin: 12.3 g/dL (ref 11.1–15.9)
Immature Grans (Abs): 0 10*3/uL (ref 0.0–0.1)
Immature Granulocytes: 1 %
Lymphocytes Absolute: 2.8 10*3/uL (ref 0.7–3.1)
Lymphs: 43 %
MCH: 25.9 pg — ABNORMAL LOW (ref 26.6–33.0)
MCHC: 30.3 g/dL — ABNORMAL LOW (ref 31.5–35.7)
MCV: 86 fL (ref 79–97)
Monocytes Absolute: 0.5 10*3/uL (ref 0.1–0.9)
Monocytes: 9 %
Neutrophils Absolute: 2.4 10*3/uL (ref 1.4–7.0)
Neutrophils: 39 %
Platelets: 302 10*3/uL (ref 150–450)
RBC: 4.74 x10E6/uL (ref 3.77–5.28)
RDW: 14.2 % (ref 11.7–15.4)
WBC: 6.3 10*3/uL (ref 3.4–10.8)

## 2021-04-07 LAB — NOVEL CORONAVIRUS, NAA: SARS-CoV-2, NAA: DETECTED — AB

## 2021-04-07 LAB — BRAIN NATRIURETIC PEPTIDE: BNP: 18.1 pg/mL (ref 0.0–100.0)

## 2021-04-08 NOTE — Progress Notes (Signed)
Contacted via [place] -- please check to make sure she received below message ?Good morning Sara Zhang, your labs have returned: ?- Covid did return positive, at this time you are past the period for oral treatment for this, which would be symptoms 5 days or less.  However, do continue Augmentin as I sent in.  Please ensure to wear mask when around people the next 5 days. ?- CBC shows no elevation in white blood cell count. ?- Kidney function, creatinine and eGFR, remains normal, as is liver function, AST and ALT.   ?- BNP level is a normal, which is good news.  No acute heart failure, but please cut back on salt intake.  Any questions? ?Keep being stellar!!  Thank you for allowing me to participate in your care.  I appreciate you. ?Kindest regards, ?Zetha Kuhar ?

## 2021-04-08 NOTE — Progress Notes (Signed)
Contacted via [place] ? ? ?Overall imaging has returned showing no pneumonia at this time, but on review of imaging and report there may be a little fluid build up from heart, definitely take the extra Lasix we discussed and cut back on salt intake.  However, review my lab note I am sending and continue Augmentin, as Covid did return positive and there is concern for pneumonia presenting.  Please follow-up as scheduled.  Any questions? ?

## 2021-04-09 ENCOUNTER — Ambulatory Visit (INDEPENDENT_AMBULATORY_CARE_PROVIDER_SITE_OTHER): Payer: Medicare Other | Admitting: Nurse Practitioner

## 2021-04-09 ENCOUNTER — Ambulatory Visit: Payer: Medicare Other

## 2021-04-09 ENCOUNTER — Encounter: Payer: Self-pay | Admitting: Nurse Practitioner

## 2021-04-09 ENCOUNTER — Other Ambulatory Visit: Payer: Self-pay

## 2021-04-09 VITALS — BP 125/77 | HR 80 | Wt 363.2 lb

## 2021-04-09 DIAGNOSIS — R051 Acute cough: Secondary | ICD-10-CM

## 2021-04-09 DIAGNOSIS — J45991 Cough variant asthma: Secondary | ICD-10-CM | POA: Diagnosis not present

## 2021-04-09 MED ORDER — AZITHROMYCIN 250 MG PO TABS: ORAL_TABLET | ORAL | 0 refills | Status: AC

## 2021-04-09 MED ORDER — ALBUTEROL SULFATE 0.63 MG/3ML IN NEBU: 1.0000 | INHALATION_SOLUTION | Freq: Four times a day (QID) | RESPIRATORY_TRACT | 12 refills | Status: DC | PRN

## 2021-04-09 MED ORDER — HYDROCOD POLI-CHLORPHE POLI ER 10-8 MG/5ML PO SUER: 5.0000 mL | Freq: Two times a day (BID) | ORAL | 0 refills | Status: DC | PRN

## 2021-04-09 NOTE — Progress Notes (Signed)
? ?BP 125/77   Pulse 80   Wt (!) 363 lb 3.2 oz (164.7 kg)   SpO2 92%   BMI 53.64 kg/m?   ? ?Subjective:  ? ? Patient ID: Sara Zhang, female    DOB: [birth year], 51 y.o.   MRN: [id number] ? ?HPI: ?Sara Zhang is a 51 y.o. female ? ?Chief Complaint  ?Patient presents with  ? Cough  ? ?UPPER RESPIRATORY TRACT INFECTION ?Follow-up today for cough with recent Covid testing positive.  Started feeling bad on Sunday evening, 03/31/21.  Coughing and mucus + lots of rattling.  Does endorse a little more salt intake daily, but is cutting back as noticed foot swelling more.  At visit on 04/06/21 we increased Lasix an extra 20 MG daily for a few days + started Augmentin and obtained CXR and blood work.  CXR no PNA present, some mild fluid noted.  Covid testing returned positive.  Labs reassuring, including BNP. ? ?Today she reports it is improving some -- continues to have some mucus in longs, worse at night.  Does not feel as bad as she did. ?Worst symptom: cough ?Fever: none ?Cough: yes, improving ?Shortness of breath: yes, mild ?Wheezing: yes, mild ?Chest pain: no ?Chest tightness: yes ?Chest congestion: yes ?Nasal congestion: yes, improving ?Runny nose: none ?Post nasal drip: no ?Sneezing: no ?Sore throat: no ?Swollen glands: no ?Sinus pressure: no ?Headache: no ?Face pain: no ?Toothache: no ?Ear pain: none ?Ear pressure: none ?Eyes red/itching:no ?Eye drainage/crusting: no  ?Vomiting: no ?Rash: no ?Fatigue: yes ?Sick contacts: no ?Strep contacts: no  ?Context: fluctuating ?Recurrent sinusitis: no ?Relief with OTC cold/cough medications: yes  ?Treatments attempted: Alka Seltzer + Cold and Delsym + Augmentin ? ?Relevant past medical, surgical, family and social history reviewed and updated as indicated. Interim medical history since our last visit reviewed. ?Allergies and medications reviewed and updated. ? ?Review of Systems  ?Constitutional:  Positive for fatigue. Negative for activity change, appetite change,  chills and fever.  ?HENT:  Positive for congestion, postnasal drip and rhinorrhea. Negative for ear discharge, ear pain, facial swelling, sinus pressure, sinus pain, sneezing, sore throat and voice change.   ?Eyes:  Negative for pain and visual disturbance.  ?Respiratory:  Positive for cough, chest tightness, shortness of breath and wheezing.   ?Cardiovascular:  Positive for leg swelling (improved). Negative for chest pain and palpitations.  ?Gastrointestinal: Negative.   ?Endocrine: Negative.   ?Neurological: Negative.   ?Psychiatric/Behavioral: Negative.    ? ?Per HPI unless specifically indicated above ? ?   ?Objective:  ?  ?BP 125/77   Pulse 80   Wt (!) 363 lb 3.2 oz (164.7 kg)   SpO2 92%   BMI 53.64 kg/m?   ?Wt Readings from Last 3 Encounters:  ?04/09/21 (!) 363 lb 3.2 oz (164.7 kg)  ?04/06/21 (!) 371 lb (168.3 kg)  ?03/27/21 (!) 371 lb (168.3 kg)  ?  ?Physical Exam ?Vitals and nursing note reviewed.  ?Constitutional:   ?   General: She is awake. She is not in acute distress. ?   Appearance: She is well-developed. She is morbidly obese. She is not ill-appearing.  ?HENT:  ?   Head: Normocephalic.  ?   Right Ear: Hearing, tympanic membrane, ear canal and external ear normal.  ?   Left Ear: Hearing, tympanic membrane, ear canal and external ear normal.  ?   Nose: Rhinorrhea present. Rhinorrhea is clear.  ?   Right Sinus: No maxillary sinus tenderness or frontal sinus tenderness.  ?  Left Sinus: No maxillary sinus tenderness or frontal sinus tenderness.  ?   Mouth/Throat:  ?   Mouth: Mucous membranes are moist.  ?   Pharynx: Oropharynx is clear.  ?Eyes:  ?   General: Lids are normal.     ?   Right eye: No discharge.     ?   Left eye: No discharge.  ?   Conjunctiva/sclera: Conjunctivae normal.  ?   Pupils: Pupils are equal, round, and reactive to light.  ?Neck:  ?   Thyroid: No thyromegaly.  ?   Vascular: No carotid bruit.  ?Cardiovascular:  ?   Rate and Rhythm: Normal rate and regular rhythm.  ?   Heart  sounds: Normal heart sounds. No murmur heard. ?  No gallop.  ?Pulmonary:  ?   Effort: Pulmonary effort is normal. No accessory muscle usage or respiratory distress.  ?   Breath sounds: Wheezing present. No decreased breath sounds or rhonchi.  ?   Comments: Intermittent expiratory wheezes throughout, however improved since previous visit. ?Abdominal:  ?   General: Bowel sounds are normal. There is no distension.  ?   Palpations: Abdomen is soft.  ?   Tenderness: There is no abdominal tenderness.  ?Musculoskeletal:  ?   Cervical back: Normal range of motion and neck supple.  ?   Right lower leg: 1+ Pitting Edema present.  ?   Left lower leg: 1+ Pitting Edema present.  ?Lymphadenopathy:  ?   Cervical: No cervical adenopathy.  ?Skin: ?   General: Skin is warm and dry.  ?Neurological:  ?   Mental Status: She is alert and oriented to person, place, and time.  ?Psychiatric:     ?   Attention and Perception: Attention normal.     ?   Mood and Affect: Mood normal.     ?   Speech: Speech normal.     ?   Behavior: Behavior normal. Behavior is cooperative.     ?   Thought Content: Thought content normal.  ? ? ?Results for orders placed or performed in visit on 04/06/21  ?Novel Coronavirus, NAA ([facility])  ? Specimen: Nasopharyngeal(NP) swabs in vial transport medium  ?Result Value Ref Range  ? SARS-CoV-2, NAA Detected (A) Not Detected  ?B Nat Peptide  ?Result Value Ref Range  ? BNP 18.1 0.0 - 100.0 pg/mL  ?CBC with Differential/Platelet  ?Result Value Ref Range  ? WBC 6.3 3.4 - 10.8 x10E3/uL  ? RBC 4.74 3.77 - 5.28 x10E6/uL  ? Hemoglobin 12.3 11.1 - 15.9 g/dL  ? Hematocrit 40.6 34.0 - 46.6 %  ? MCV 86 79 - 97 fL  ? MCH 25.9 (L) 26.6 - 33.0 pg  ? MCHC 30.3 (L) 31.5 - 35.7 g/dL  ? RDW 14.2 11.7 - 15.4 %  ? Platelets 302 150 - 450 x10E3/uL  ? Neutrophils 39 Not Estab. %  ? Lymphs 43 Not Estab. %  ? Monocytes 9 Not Estab. %  ? Eos 7 Not Estab. %  ? Basos 1 Not Estab. %  ? Neutrophils Absolute 2.4 1.4 - 7.0 x10E3/uL  ? Lymphocytes  Absolute 2.8 0.7 - 3.1 x10E3/uL  ? Monocytes Absolute 0.5 0.1 - 0.9 x10E3/uL  ? EOS (ABSOLUTE) 0.4 0.0 - 0.4 x10E3/uL  ? Basophils Absolute 0.1 0.0 - 0.2 x10E3/uL  ? Immature Granulocytes 1 Not Estab. %  ? Immature Grans (Abs) 0.0 0.0 - 0.1 x10E3/uL  ?Comp Met (CMET)  ?Result Value Ref Range  ? Glucose 236 (H)  70 - 99 mg/dL  ? BUN 16 6 - 24 mg/dL  ? Creatinine, Ser 1.02 (H) 0.57 - 1.00 mg/dL  ? eGFR 66 >59 mL/min/1.73  ? BUN/Creatinine Ratio 16 9 - 23  ? Sodium 142 134 - 144 mmol/L  ? Potassium 4.6 3.5 - 5.2 mmol/L  ? Chloride 100 96 - 106 mmol/L  ? CO2 26 20 - 29 mmol/L  ? Calcium 7.7 (L) 8.7 - 10.2 mg/dL  ? Total Protein 6.4 6.0 - 8.5 g/dL  ? Albumin 3.6 (L) 3.8 - 4.9 g/dL  ? Globulin, Total 2.8 1.5 - 4.5 g/dL  ? Albumin/Globulin Ratio 1.3 1.2 - 2.2  ? Bilirubin Total 0.2 0.0 - 1.2 mg/dL  ? Alkaline Phosphatase 97 44 - 121 IU/L  ? AST 18 0 - 40 IU/L  ? ALT 15 0 - 32 IU/L  ? ?   ?Assessment & Plan:  ? ?Problem List Items Addressed This Visit   ? ?  ? Respiratory  ? Cough variant asthma vs Upper Airway cough syndrome - Primary  ?  Chronic, ongoing with improvement with change to Losartan.  Refer to acute cough POC. ?  ?  ? Relevant Medications  ? albuterol (ACCUNEB) 0.63 MG/3ML nebulizer solution  ? Other Relevant Orders  ? For home use only DME Nebulizer machine  ?  ? Other  ? Acute cough  ?  Acute and improving with positive Covid testing and stable CXR + blood work.  Avoid Alka Seltzer Plus, take Coricidin or Diabetic Tussin OTC only.  Continue Augmentin and add on Azithromycin for antiflammatory benefit, goal is to avoid hospitalization and PNA as she previously has had with Covid.  Tussionex script for night cough + nebulizer sent in.  Recommend: ?- Increased rest ?- Increasing Fluids ?- Acetaminophen as needed for fever/pain.  ?- Salt water gargling, chloraseptic spray and throat lozenges ?Return in one week for follow-up.  Go immediately to ER over weekend if worsening. ?  ?  ? Relevant Orders  ? For home use  only DME Nebulizer machine  ?  ? ?Follow up plan: ?Return in about 1 week (around 04/16/2021) for Covid -- may be in a virtual spot. ? ? ? ? ? ?

## 2021-04-09 NOTE — Patient Instructions (Signed)
May take Coricidin during the day and Diabetic Tussin -- these are okay. ? ?Cough, Adult ?A cough helps to clear your throat and lungs. A cough may be a sign of an illness or another medical condition. ?An acute cough may only last 2-3 weeks, while a chronic cough may last 8 or more weeks. ?Many things can cause a cough. They include: ?Germs (viruses or bacteria) that attack the airway. ?Breathing in things that bother (irritate) your lungs. ?Allergies. ?Asthma. ?Mucus that runs down the back of your throat (postnasal drip). ?Smoking. ?Acid backing up from the stomach into the tube that moves food from the mouth to the stomach (gastroesophageal reflux). ?Some medicines. ?Lung problems. ?Other medical conditions, such as heart failure or a blood clot in the lung (pulmonary embolism). ?Follow these instructions at home: ?Medicines ?Take over-the-counter and prescription medicines only as told by your doctor. ?Talk with your doctor before you take medicines that stop a cough (cough suppressants). ?Lifestyle ? ?Do not smoke, and try not to be around smoke. Do not use any products that contain nicotine or tobacco, such as cigarettes, e-cigarettes, and chewing tobacco. If you need help quitting, ask your doctor. ?Drink enough fluid to keep your pee (urine) pale yellow. ?Avoid caffeine. ?Do not drink alcohol if your doctor tells you not to drink. ?General instructions ? ?Watch for any changes in your cough. Tell your doctor about them. ?Always cover your mouth when you cough. ?Stay away from things that make you cough, such as perfume, candles, campfire smoke, or cleaning products. ?If the air is dry, use a cool mist vaporizer or humidifier in your home. ?If your cough is worse at night, try using extra pillows to raise your head up higher while you sleep. ?Rest as needed. ?Keep all follow-up visits as told by your doctor. This is important. ?Contact a doctor if: ?You have new symptoms. ?You cough up pus. ?Your cough does  not get better after 2-3 weeks, or your cough gets worse. ?Cough medicine does not help your cough and you are not sleeping well. ?You have pain that gets worse or pain that is not helped with medicine. ?You have a fever. ?You are losing weight and you do not know why. ?You have night sweats. ?Get help right away if: ?You cough up blood. ?You have trouble breathing. ?Your heartbeat is very fast. ?These symptoms may be an emergency. Do not wait to see if the symptoms will go away. Get medical help right away. Call your local emergency services (911 in the [place] Do not drive yourself to the hospital. ?Summary ?A cough helps to clear your throat and lungs. Many things can cause a cough. ?Take over-the-counter and prescription medicines only as told by your doctor. ?Always cover your mouth when you cough. ?Contact a doctor if you have new symptoms or you have a cough that does not get better or gets worse. ?This information is not intended to replace advice given to you by your health care provider. Make sure you discuss any questions you have with your health care provider. ?Document Revised: 03/12/2019 Document Reviewed: 02/09/2018 ?Elsevier Patient Education ? 2022 [employer] ? ?

## 2021-04-09 NOTE — Assessment & Plan Note (Signed)
Chronic, ongoing with improvement with change to Losartan.  Refer to acute cough POC. ?

## 2021-04-09 NOTE — Assessment & Plan Note (Addendum)
Acute and improving with positive Covid testing and stable CXR + blood work.  Avoid Alka Seltzer Plus, take Coricidin or Diabetic Tussin OTC only.  Continue Augmentin and add on Azithromycin for antiflammatory benefit, goal is to avoid hospitalization and PNA as she previously has had with Covid.  Tussionex script for night cough + nebulizer sent in.  Recommend: ?- Increased rest ?- Increasing Fluids ?- Acetaminophen as needed for fever/pain.  ?- Salt water gargling, chloraseptic spray and throat lozenges ?Return in one week for follow-up.  Go immediately to ER over weekend if worsening. ?

## 2021-04-10 ENCOUNTER — Telehealth: Payer: Self-pay | Admitting: Nurse Practitioner

## 2021-04-10 NOTE — Telephone Encounter (Signed)
Copied from CRM [id number]. Topic: General - Other ?>> Apr 10, 2021 10:03 AM Darron Doom wrote: ?Reason for CRM: Diannia Ruder with [facility] called in to inform [employer] [employer] that they are needing office notes along with the signed order that have been faxed over. Any questions please call Diannia Ruder at Ph# [phone] ?

## 2021-04-10 NOTE — Telephone Encounter (Signed)
DME ordered printed along with office notes from patient last 2 in office visits printed and faxed to [facility] to [place].  ?

## 2021-04-11 NOTE — Telephone Encounter (Signed)
[facility] called in stating they had faxed over the last form they need filled out, the confirmation form, but has not received it back and was wanting to get it an update on that, please advise. Call back #: [phone]. Diannia Ruder ?

## 2021-04-12 ENCOUNTER — Other Ambulatory Visit: Payer: Medicare Other

## 2021-04-12 ENCOUNTER — Other Ambulatory Visit: Payer: Self-pay

## 2021-04-12 DIAGNOSIS — E875 Hyperkalemia: Secondary | ICD-10-CM | POA: Diagnosis not present

## 2021-04-12 DIAGNOSIS — E870 Hyperosmolality and hypernatremia: Secondary | ICD-10-CM

## 2021-04-12 NOTE — Telephone Encounter (Signed)
Spoke with [employer] and they had not received the requested paperwork. Informed [facility] we would re-fax paperwork.  ?

## 2021-04-13 LAB — BASIC METABOLIC PANEL
BUN/Creatinine Ratio: 16 (ref 9–23)
BUN: 15 mg/dL (ref 6–24)
CO2: 28 mmol/L (ref 20–29)
Calcium: 8.1 mg/dL — ABNORMAL LOW (ref 8.7–10.2)
Chloride: 100 mmol/L (ref 96–106)
Creatinine, Ser: 0.92 mg/dL (ref 0.57–1.00)
Glucose: 103 mg/dL — ABNORMAL HIGH (ref 70–99)
Potassium: 4.3 mmol/L (ref 3.5–5.2)
Sodium: 142 mmol/L (ref 134–144)
eGFR: 75 mL/min/{1.73_m2} (ref >59)

## 2021-04-13 NOTE — [place] Notes (Signed)
Contacted via [place] ? ? ?Good morning Sara Zhang, your labs have returned and overall kidney function remains stable.  No changes needed.:) ?Keep being stellar!!  Thank you for allowing me to participate in your care.  I appreciate you. ?Kindest regards, ?Trevyn Lumpkin ?

## 2021-04-15 NOTE — Patient Instructions (Signed)

## 2021-04-17 ENCOUNTER — Encounter: Payer: Self-pay | Admitting: Nurse Practitioner

## 2021-04-17 ENCOUNTER — Telehealth (INDEPENDENT_AMBULATORY_CARE_PROVIDER_SITE_OTHER): Payer: Medicare Other | Admitting: Nurse Practitioner

## 2021-04-17 DIAGNOSIS — R051 Acute cough: Secondary | ICD-10-CM

## 2021-04-17 NOTE — Assessment & Plan Note (Signed)
Acute and improved with positive Covid testing and stable CXR + blood work.  Recommend: ?- Increased rest ?- Increasing Fluids ?- Acetaminophen as needed for fever/pain.  ?- Salt water gargling, chloraseptic spray and throat lozenges ?Return as needed at this time if any worsening symptoms. ?

## 2021-04-17 NOTE — Progress Notes (Signed)
? ?There were no vitals taken for this visit.  ? ?Subjective:  ? ? Patient ID: Sara Zhang, female    DOB: 09-21-1970, 51 y.o.   MRN: [id number] ? ?HPI: ?Sara Zhang is a 51 y.o. female ? ?Chief Complaint  ?Patient presents with  ? Covid Positive  ?  Patient is following up on COVID positive. Patient states she is feeling better.   ? ?This visit was completed via video visit through MyChart due to the restrictions of the COVID-19 pandemic. All issues as above were discussed and addressed. Physical exam was done as above through visual confirmation on video through MyChart. If it was felt that the patient should be evaluated in the office, they were directed there. The patient verbally consented to this visit. ?Location of the patient: home ?Location of the provider: work ?Those involved with this call:  ?Provider: Marnee Guarneri, DNP ?CMA: Irena Reichmann, CMA ?Front Desk/Registration: [employer]  ?Time spent on call:  21 minutes with patient face to face via video conference. More than 50% of this time was spent in counseling and coordination of care. 15 minutes total spent in review of patient's record and preparation of their chart.  ?I verified patient identity using two factors (patient name and date of birth). Patient consents verbally to being seen via telemedicine visit today.   ? ?UPPER RESPIRATORY TRACT INFECTION ?Follow-up today for cough with recent Covid testing positive on 04/06/21.  Started feeling bad on 03/31/21.  At visit on 04/06/21 we increased Lasix an extra 20 MG daily for a few days + started Augmentin and obtained CXR and blood work.  CXR no PNA present, some mild fluid noted.  Covid testing returned positive.  Labs reassuring, including BNP. ? ?Today she reports it is improving some, about 95% better, only mild cough at night.   ?Worst symptom: cough ?Fever: none ?Cough: yes, improving ?Shortness of breath: improving ?Wheezing: improving ?Chest pain: no ?Chest tightness: none ?Chest  congestion: none ?Nasal congestion: improved ?Runny nose: none ?Post nasal drip: no ?Sneezing: no ?Sore throat: no ?Swollen glands: no ?Sinus pressure: no ?Headache: no ?Face pain: no ?Toothache: no ?Ear pain: none ?Ear pressure: none ?Eyes red/itching:no ?Eye drainage/crusting: no  ?Vomiting: no ?Rash: no ?Fatigue: yes ?Sick contacts: no ?Strep contacts: no  ?Context: fluctuating ?Recurrent sinusitis: no ?Relief with OTC cold/cough medications: yes  ?Treatments attempted: Alka Seltzer + Cold and Delsym + Augmentin ? ?Relevant past medical, surgical, family and social history reviewed and updated as indicated. Interim medical history since our last visit reviewed. ?Allergies and medications reviewed and updated. ? ?Review of Systems  ?Constitutional:  Negative for activity change, appetite change, chills, fatigue and fever.  ?HENT: Negative.    ?Eyes:  Negative for pain and visual disturbance.  ?Respiratory:  Negative for cough, chest tightness, shortness of breath and wheezing.   ?Cardiovascular:  Negative for chest pain, palpitations and leg swelling (improved).  ?Gastrointestinal: Negative.   ?Neurological: Negative.   ?Psychiatric/Behavioral: Negative.    ? ?Per HPI unless specifically indicated above ? ?   ?Objective:  ?  ?There were no vitals taken for this visit.  ?Wt Readings from Last 3 Encounters:  ?04/09/21 (!) 363 lb 3.2 oz (164.7 kg)  ?04/06/21 (!) 371 lb (168.3 kg)  ?03/27/21 (!) 371 lb (168.3 kg)  ?  ?Physical Exam ?Vitals and nursing note reviewed.  ?Constitutional:   ?   General: She is awake. She is not in acute distress. ?   Appearance: She  is well-developed. She is not ill-appearing.  ?HENT:  ?   Head: Normocephalic.  ?   Right Ear: Hearing normal.  ?   Left Ear: Hearing normal.  ?Eyes:  ?   General: Lids are normal.     ?   Right eye: No discharge.     ?   Left eye: No discharge.  ?   Conjunctiva/sclera: Conjunctivae normal.  ?Pulmonary:  ?   Effort: Pulmonary effort is normal. No accessory  muscle usage or respiratory distress.  ?Musculoskeletal:  ?   Cervical back: Normal range of motion.  ?Neurological:  ?   Mental Status: She is alert and oriented to person, place, and time.  ?Psychiatric:     ?   Attention and Perception: Attention normal.     ?   Mood and Affect: Mood normal.     ?   Behavior: Behavior normal. Behavior is cooperative.     ?   Thought Content: Thought content normal.     ?   Judgment: Judgment normal.  ? ? ?Results for orders placed or performed in visit on 04/12/21  ?Basic metabolic panel  ?Result Value Ref Range  ? Glucose 103 (H) 70 - 99 mg/dL  ? BUN 15 6 - 24 mg/dL  ? Creatinine, Ser 0.92 0.57 - 1.00 mg/dL  ? eGFR 75 >59 mL/min/1.73  ? BUN/Creatinine Ratio 16 9 - 23  ? Sodium 142 134 - 144 mmol/L  ? Potassium 4.3 3.5 - 5.2 mmol/L  ? Chloride 100 96 - 106 mmol/L  ? CO2 28 20 - 29 mmol/L  ? Calcium 8.1 (L) 8.7 - 10.2 mg/dL  ? ?   ?Assessment & Plan:  ? ?Problem List Items Addressed This Visit   ? ?  ? Other  ? Acute cough  ?  Acute and improved with positive Covid testing and stable CXR + blood work.  Recommend: ?- Increased rest ?- Increasing Fluids ?- Acetaminophen as needed for fever/pain.  ?- Salt water gargling, chloraseptic spray and throat lozenges ?Return as needed at this time if any worsening symptoms. ?  ?  ? ? I discussed the assessment and treatment plan with the patient. The patient was provided an opportunity to ask questions and all were answered. The patient agreed with the plan and demonstrated an understanding of the instructions. ? ? ?The patient was advised to call back or seek an in-person evaluation if the symptoms worsen or if the condition fails to improve as anticipated. ? ? ?I provided 21+ minutes of time during this encounter.  ? ?Follow up plan: ?Return if symptoms worsen or fail to improve. ? ? ? ? ? ? ? ? ? ? ?

## 2021-04-23 ENCOUNTER — Telehealth: Payer: Self-pay | Admitting: Nurse Practitioner

## 2021-04-23 ENCOUNTER — Telehealth: Payer: Self-pay

## 2021-04-23 NOTE — Progress Notes (Signed)
? ? ?Chronic Care Management ?Pharmacy Assistant  ? ?Name: Sara Zhang  MRN: [id number] DOB: Mar 16, 1970 ? ?Sara Zhang is an 51 y.o. year old female who presents for his follow-up CCM visit with the clinical pharmacist. ? ?Reason for Encounter: Disease State-General ?  ? ?Recent office visits:  ?04/17/21 Marnee Guarneri T, NP (Acute cough) Increased rest, Increasing Fluids, Acetaminophen as needed for fever/pain. ? ?04/09/21  Marnee Guarneri T, NP (Acute cough) For home use only DME Nebulizer machine ordered, Med changes: azithromycin (ZITHROMAX) 250 MG tablet, chlorpheniramine-HYDROcodone (TUSSIONEX PENNKINETIC ER) 10-8 MG/5ML, albuteroalbuterol (ACCUNEB) 0.63 MG/3ML nebulizer solutionl (VENTOLIN HFA) 108 (90 Base) MCG/ACT inhaler,  ? ?04/06/21 04/09/21  Cannady, Henrine Screws T, NP (Acute cough, pneumonia) Labs ordered, Med changes: albuterol (VENTOLIN HFA) 108 (90 Base) MCG/ACT inhaler, and amoxicillin-clavulanate (AUGMENTIN) 875-125 MG tablet ? ?03/27/21 Marnee Guarneri T, NP (diabetes, Hyperlip, HTN, CHF, kidney disease) recommend checking BP three mornings a week at home,  DASH diet focus and avoidance of high sodium foods, and cutting back on pork skins, CMP and CBC checked today. Return in July 2023. ?12/25/20  ? ?03/27/21 Marnee Guarneri T, NP (diabetes, Hyperlip, HTN, CHF, kidney disease) Blood work ordered, Med changes: Dulaglutide (TRULICITY) 1.5 UY/4.0HK SOPN ? ?Recent consult visits:  ?03/26/21 Devona Konig A, MD-Internal Medicine (OSA) Pulse oximetry, overnight ? ?03/14/21 End, Harrell Gave, MD-Cardiology (CHF) Please cut out sweets and sodas from your diet, per Dr. Saunders Revel ? ?12/07/20 End, Harrell Gave, MD-Cardiology (CHF) CONTINUE Lasix 40 mg TWICE daily, PLEASE LIMIT WATER INTAKE TO 64 oz PER DAY, 3 month(s) w/ APP ? ?11/28/20 Tanda Rockers, MD-Pulmonary Disease (Cough) Stop pantoprazole (the one before bfast) and change Pepcid to where you take it after breakfast and supper x 1 week, then just take at bedtime for  a week then stop. ? ?Hospital visits:  ?None in previous 6 months ? ?Medications: ?Outpatient Encounter Medications as of 04/23/2021  ?Medication Sig  ? albuterol (ACCUNEB) 0.63 MG/3ML nebulizer solution Take 3 mLs (0.63 mg total) by nebulization every 6 (six) hours as needed for wheezing.  ? albuterol (VENTOLIN HFA) 108 (90 Base) MCG/ACT inhaler Inhale 2 puffs into the lungs every 6 (six) hours as needed for wheezing or shortness of breath.  ? amLODipine (NORVASC) 5 MG tablet TAKE 1 TABLET(5 MG) BY MOUTH DAILY  ? atenolol (TENORMIN) 50 MG tablet Take 1 tablet (50 mg total) by mouth daily.  ? atorvastatin (LIPITOR) 10 MG tablet TAKE 1 TABLET(10 MG) BY MOUTH DAILY  ? Blood Glucose Monitoring Suppl (CONTOUR NEXT MONITOR) w/Device KIT To check blood sugar 4 times daily.  ? Calcium Carbonate-Vitamin D (OYSTER SHELL CALCIUM 500 + D) 500-125 MG-UNIT TABS Take 1 tablet by mouth daily.   ? chlorpheniramine-HYDROcodone (TUSSIONEX PENNKINETIC ER) 10-8 MG/5ML Take 5 mLs by mouth every 12 (twelve) hours as needed for cough.  ? Cholecalciferol (VITAMIN D3) 10 MCG (400 UNIT) tablet Take 400 Units by mouth daily.  ? Dulaglutide (TRULICITY) 1.5 VQ/2.5ZD SOPN Inject 1.5 mg into the skin once a week.  ? furosemide (LASIX) 40 MG tablet Take 1 tablet (40 mg total) by mouth 2 (two) times daily.  ? glucose blood (CONTOUR NEXT TEST) test strip TEST BLOOD SUGAR ONCE DAILY  ? Insulin Glargine (TOUJEO SOLOSTAR) 300 UNIT/ML SOPN Inject 54 Units into the skin at bedtime.  ? Insulin Pen Needle 32G X 4 MM MISC 1 Units by Does not apply route every morning. Pen needles  ? JARDIANCE 25 MG TABS tablet TAKE 1 TABLET(25 MG)  BY MOUTH DAILY  ? Lancets (ONETOUCH ULTRASOFT) lancets Use as instructed  ? levonorgestrel (MIRENA) 20 MCG/24HR IUD 1 each by Intrauterine route once.   ? losartan (COZAAR) 25 MG tablet TAKE 1 TABLET(25 MG) BY MOUTH DAILY  ? OXYGEN Inhale 2 L into the lungs daily.  ? ?No facility-administered encounter medications on file as of  04/23/2021.  ? ?Have you had any problems recently with your health?Patient states that she is not having any new health issues and all medications are up to date ? ?Have you had any problems with your pharmacy?Patient sates that she does not have any problems with getting medications from the pharmacy or the cost of medications ? ?What issues or side effects are you having with your medications?Patient states that she is not having any side effects from medications ? ?What would you like me to pass along to [facility] Nasuti Potts,CPP for them to help you with? Patient states that she is doing well and does not have any complaints ? ?What can we do to take care of you better? Patient states that she does not need anything at this time ? ?Care Gaps: ?Colonoscopy-NA ?Diabetic Foot Exam-NA ?Mammogram-NA ?Ophthalmology-NA ?Dexa Scan - NA ?Annual Well Visit - NA ?Micro albumin-03/27/21 ?Hemoglobin A1c- 03/27/21 ? ?Star Rating Drugs: ?Losartan 25 mg-last fill 03/16/21 90 ds ?Jardiance 25 mg-last fill 03/16/21 90 ds ?Atorvastatin 10 mg-last fill 03/02/21 90 ds ?Trulicity 1.5 MA/2.6JF-HLKT fill 03/26/21 84 ds ? ?Ethelene Hal ?Clinical Pharmacist Assistant ?[phone]  ?

## 2021-04-23 NOTE — Telephone Encounter (Signed)
Copied from CRM [id number]. Topic: General - Other ?>> Apr 23, 2021  1:17 PM Marylen Ponto wrote: ?Reason for CRM: Diannia Ruder with [facility] stated they have not received confirmation of order for nebulizer. Cb# [phone] ?

## 2021-04-24 NOTE — Telephone Encounter (Signed)
Requested paperwork and required documents have faxed over to [facility] at [phone] and [phone]. Spoke with [place] at [employer] to follow up to see if she received. Diannia Ruder  verbalized understanding. ?

## 2021-05-03 DIAGNOSIS — R0902 Hypoxemia: Secondary | ICD-10-CM | POA: Diagnosis not present

## 2021-05-27 ENCOUNTER — Other Ambulatory Visit: Payer: Self-pay | Admitting: Nurse Practitioner

## 2021-05-28 ENCOUNTER — Other Ambulatory Visit: Payer: Self-pay | Admitting: Internal Medicine

## 2021-06-05 DIAGNOSIS — H40003 Preglaucoma, unspecified, bilateral: Secondary | ICD-10-CM | POA: Diagnosis not present

## 2021-06-07 ENCOUNTER — Other Ambulatory Visit: Payer: Self-pay | Admitting: Nurse Practitioner

## 2021-06-07 NOTE — Telephone Encounter (Signed)
Requested Prescriptions  ?Pending Prescriptions Disp Refills  ?? losartan (COZAAR) 25 MG tablet [Pharmacy Med Name: LOSARTAN 25MG TABLETS] 90 tablet 0  ?  Sig: TAKE 1 TABLET(25 MG) BY MOUTH DAILY  ?  ? Cardiovascular:  Angiotensin Receptor Blockers Passed - 06/07/2021  5:50 AM  ?  ?  Passed - Cr in normal range and within 180 days  ?  Creatinine  ?Date Value Ref Range Status  ?09/25/2013 0.75 0.60 - 1.30 mg/dL Final  ? ?Creatinine, Ser  ?Date Value Ref Range Status  ?04/12/2021 0.92 0.57 - 1.00 mg/dL Final  ?   ?  ?  Passed - K in normal range and within 180 days  ?  Potassium  ?Date Value Ref Range Status  ?04/12/2021 4.3 3.5 - 5.2 mmol/L Final  ?09/25/2013 4.2 3.5 - 5.1 mmol/L Final  ?   ?  ?  Passed - Patient is not pregnant  ?  ?  Passed - Last BP in normal range  ?  BP Readings from Last 1 Encounters:  ?04/09/21 125/77  ?   ?  ?  Passed - Valid encounter within last 6 months  ?  Recent Outpatient Visits   ?      ? 1 month ago Acute cough  ? [place], Sanford T, NP  ? 1 month ago Cough variant asthma vs Upper Airway cough syndrome  ? [place], Marianne T, NP  ? 2 months ago Chronic heart failure with preserved ejection fraction ([place])  ? [employer], [place] T, NP  ? 2 months ago Type 2 diabetes mellitus treated with insulin ([place])  ? [place], Lilly T, NP  ? 5 months ago Type 2 diabetes mellitus treated with insulin ([place])  ? [facility] [place], Henrine Screws T, NP  ?  ?  ?Future Appointments   ?        ? In 2 months Cannady, Barbaraann Faster, NP [employer], [facility]  ? In 3 months  [place], [facility]  ?  ? ?  ?  ?  ?? JARDIANCE 25 MG TABS tablet [Pharmacy Med Name: JARDIANCE 25MG TABLETS] 90 tablet 0  ?  Sig: TAKE 1 TABLET(25 MG) BY MOUTH DAILY  ?  ? Endocrinology:  Diabetes - SGLT2 Inhibitors Passed - 06/07/2021  5:50 AM  ?  ?  Passed - Cr in normal range and within 360 days  ?  Creatinine  ?Date Value Ref  Range Status  ?09/25/2013 0.75 0.60 - 1.30 mg/dL Final  ? ?Creatinine, Ser  ?Date Value Ref Range Status  ?04/12/2021 0.92 0.57 - 1.00 mg/dL Final  ?   ?  ?  Passed - HBA1C is between 0 and 7.9 and within 180 days  ?  HB A1C (BAYER DCA - WAIVED)  ?Date Value Ref Range Status  ?03/27/2021 7.1 (H) 4.8 - 5.6 % Final  ?  Comment:  ?           Prediabetes: 5.7 - 6.4 ?         Diabetes: >6.4 ?         Glycemic control for adults with diabetes: <7.0 ?  ?   ?  ?  Passed - eGFR in normal range and within 360 days  ?  EGFR (African American)  ?Date Value Ref Range Status  ?09/25/2013 >60  Final  ? ?GFR calc Af Wyvonnia Lora  ?Date Value Ref Range Status  ?03/08/2020 61 >  59 mL/min/1.73 Final  ?  Comment:  ?  **In accordance with recommendations from the NKF-ASN Task force,** ?  [facility] is in the process of updating its eGFR calculation to the ?  2021 CKD-EPI creatinine equation that estimates kidney function ?  without a race variable. ?  ? ?EGFR (Non-African Amer.)  ?Date Value Ref Range Status  ?09/25/2013 >60  Final  ?  Comment:  ?  eGFR values <60m/min/1.73 m2 may be an indication of chronic ?kidney disease (CKD). ?Calculated eGFR is useful in patients with stable renal function. ?The eGFR calculation will not be reliable in acutely ill patients ?when serum creatinine is changing rapidly. It is not useful in  ?patients on dialysis. The eGFR calculation may not be applicable ?to patients at the low and high extremes of body sizes, pregnant ?women, and vegetarians. ?  ? ?GFR, Estimated  ?Date Value Ref Range Status  ?02/04/2020 >60 >60 mL/min Final  ?  Comment:  ?  (NOTE) ?Calculated using the CKD-EPI Creatinine Equation (2021) ?  ? ?GFR calc non Af Amer  ?Date Value Ref Range Status  ?03/08/2020 53 (L) >59 mL/min/1.73 Final  ? ?eGFR  ?Date Value Ref Range Status  ?04/12/2021 75 >59 mL/min/1.73 Final  ?   ?  ?  Passed - Valid encounter within last 6 months  ?  Recent Outpatient Visits   ?      ? 1 month ago Acute cough  ? [place], NP  ? 1 month ago Cough variant asthma vs Upper Airway cough syndrome  ? [place], NP  ? 2 months ago Chronic heart failure with preserved ejection fraction ([place]  ? [place], NP  ? 2 months ago Type 2 diabetes mellitus treated with insulin ([place]  ? [place], NP  ? 5 months ago Type 2 diabetes mellitus treated with insulin ([place]  ? [facility]  ?  ?  ?Future Appointments   ?        ? In 2 months Cannady, JBarbaraann Faster NP [facility]  ? In 3 months  [place] [facility]  ?  ? ?  ?  ?  ?? atenolol (TENORMIN) 50 MG tablet [Pharmacy Med Name: ATENOLOL 50MG TABLETS] 90 tablet 0  ?  Sig: TAKE 1 TABLET(50 MG) BY MOUTH DAILY  ?  ? Cardiovascular: Beta Blockers 2 Passed - 06/07/2021  5:50 AM  ?  ?  Passed - Cr in normal range and within 360 days  ?  Creatinine  ?Date Value Ref Range Status  ?09/25/2013 0.75 0.60 - 1.30 mg/dL Final  ? ?Creatinine, Ser  ?Date Value Ref Range Status  ?04/12/2021 0.92 0.57 - 1.00 mg/dL Final  ?   ?  ?  Passed - Last BP in normal range  ?  BP Readings from Last 1 Encounters:  ?04/09/21 125/77  ?   ?  ?  Passed - Last Heart Rate in normal range  ?  Pulse Readings from Last 1 Encounters:  ?04/09/21 80  ?   ?  ?  Passed - Valid encounter within last 6 months  ?  Recent Outpatient Visits   ?      ? 1 month ago Acute cough  ? [place], NP  ? 1 month ago Cough variant asthma vs Upper Airway cough syndrome  ?  [place], [place] T, NP  ? 2 months ago Chronic heart failure with preserved ejection fraction ([place])  ? [place], Borden T, NP  ? 2 months ago Type 2 diabetes mellitus treated with insulin ([place])  ? [place], Morrow T, NP  ? 5 months ago Type 2 diabetes mellitus treated with insulin ([place])  ? [facility] [place], Henrine Screws T, NP  ?  ?  ?Future Appointments   ?        ? In 2 months Cannady, Barbaraann Faster, NP [employer] [facility], [facility]  ? In 3 months  [place], [facility]  ?  ? ?  ?  ?  ? ? ?

## 2021-06-18 ENCOUNTER — Other Ambulatory Visit: Payer: Self-pay | Admitting: Nurse Practitioner

## 2021-06-19 NOTE — Telephone Encounter (Signed)
Requested Prescriptions  ?Pending Prescriptions Disp Refills  ?? albuterol (VENTOLIN HFA) 108 (90 Base) MCG/ACT inhaler [Pharmacy Med Name: ALBUTEROL HFA INH(200 PUFFS) 18GM] 18 g 2  ?  Sig: INHALE 2 PUFFS INTO THE LUNGS EVERY 6 HOURS AS NEEDED FOR WHEEZING OR SHORTNESS OF BREATH  ?  ? Pulmonology:  Beta Agonists 2 Passed - 06/18/2021  9:08 AM  ?  ?  Passed - Last BP in normal range  ?  BP Readings from Last 1 Encounters:  ?04/09/21 125/77  ?   ?  ?  Passed - Last Heart Rate in normal range  ?  Pulse Readings from Last 1 Encounters:  ?04/09/21 80  ?   ?  ?  Passed - Valid encounter within last 12 months  ?  Recent Outpatient Visits   ?      ? 2 months ago Acute cough  ? [facility] [place], [place] T, NP  ? 2 months ago Cough variant asthma vs Upper Airway cough syndrome  ? [facility] [place], [place] T, NP  ? 2 months ago Chronic heart failure with preserved ejection fraction (HCC)  ? [facility] [place], [place] T, NP  ? 2 months ago Type 2 diabetes mellitus treated with insulin (HCC)  ? [facility] [place], [place] T, NP  ? 5 months ago Type 2 diabetes mellitus treated with insulin (HCC)  ? [facility] [place], Corrie Dandy T, NP  ?  ?  ?Future Appointments   ?        ? In 2 months Cannady, Dorie Rank, NP [employer], [facility]  ? In 3 months  [facility], [facility]  ?  ? ?  ?  ?  ? ? ?

## 2021-08-14 DIAGNOSIS — E1159 Type 2 diabetes mellitus with other circulatory complications: Secondary | ICD-10-CM | POA: Diagnosis not present

## 2021-08-14 DIAGNOSIS — R809 Proteinuria, unspecified: Secondary | ICD-10-CM | POA: Diagnosis not present

## 2021-08-14 DIAGNOSIS — E785 Hyperlipidemia, unspecified: Secondary | ICD-10-CM | POA: Diagnosis not present

## 2021-08-14 DIAGNOSIS — E1169 Type 2 diabetes mellitus with other specified complication: Secondary | ICD-10-CM | POA: Diagnosis not present

## 2021-08-14 DIAGNOSIS — E669 Obesity, unspecified: Secondary | ICD-10-CM | POA: Diagnosis not present

## 2021-08-14 DIAGNOSIS — I152 Hypertension secondary to endocrine disorders: Secondary | ICD-10-CM | POA: Diagnosis not present

## 2021-08-14 DIAGNOSIS — Z794 Long term (current) use of insulin: Secondary | ICD-10-CM | POA: Diagnosis not present

## 2021-08-14 DIAGNOSIS — E1129 Type 2 diabetes mellitus with other diabetic kidney complication: Secondary | ICD-10-CM | POA: Diagnosis not present

## 2021-08-19 NOTE — Patient Instructions (Signed)

## 2021-08-22 ENCOUNTER — Other Ambulatory Visit: Payer: Self-pay | Admitting: Internal Medicine

## 2021-08-22 ENCOUNTER — Other Ambulatory Visit: Payer: Self-pay | Admitting: Nurse Practitioner

## 2021-08-23 NOTE — Telephone Encounter (Signed)
Requested Prescriptions  Pending Prescriptions Disp Refills  . losartan (COZAAR) 25 MG tablet [Pharmacy Med Name: LOSARTAN 25MG TABLETS] 90 tablet 0    Sig: TAKE 1 TABLET(25 MG) BY MOUTH DAILY     Cardiovascular:  Angiotensin Receptor Blockers Passed - 08/22/2021  6:29 AM      Passed - Cr in normal range and within 180 days    Creatinine  Date Value Ref Range Status  09/25/2013 0.75 0.60 - 1.30 mg/dL Final   Creatinine, Ser  Date Value Ref Range Status  04/12/2021 0.92 0.57 - 1.00 mg/dL Final         Passed - K in normal range and within 180 days    Potassium  Date Value Ref Range Status  04/12/2021 4.3 3.5 - 5.2 mmol/L Final  09/25/2013 4.2 3.5 - 5.1 mmol/L Final         Passed - Patient is not pregnant      Passed - Last BP in normal range    BP Readings from Last 1 Encounters:  04/09/21 125/77         Passed - Valid encounter within last 6 months    Recent Outpatient Visits          4 months ago Acute cough   [place], Jolene T, NP   4 months ago Cough variant asthma vs Upper Airway cough syndrome   [facility] [place], Jolene T, NP   4 months ago Chronic heart failure with preserved ejection fraction ([place])   [place], Jolene T, NP   4 months ago Type 2 diabetes mellitus treated with insulin ([place])   [place], Jolene T, NP   8 months ago Type 2 diabetes mellitus treated with insulin ([place])   [place], Barbaraann Faster, NP      Future Appointments            Tomorrow Venita Lick, NP [place], [facility]   In 1 month  [facility], [facility]   In 1 month Furth, Cadence H, PA-C [place], [facility]           . JARDIANCE 25 MG TABS tablet [Pharmacy Med Name: JARDIANCE 25MG TABLETS] 90 tablet 0    Sig: TAKE 1 TABLET(25 MG) BY MOUTH DAILY     Endocrinology:  Diabetes - SGLT2 Inhibitors Passed - 08/22/2021  6:29 AM       Passed - Cr in normal range and within 360 days    Creatinine  Date Value Ref Range Status  09/25/2013 0.75 0.60 - 1.30 mg/dL Final   Creatinine, Ser  Date Value Ref Range Status  04/12/2021 0.92 0.57 - 1.00 mg/dL Final         Passed - HBA1C is between 0 and 7.9 and within 180 days    HB A1C (BAYER DCA - WAIVED)  Date Value Ref Range Status  03/27/2021 7.1 (H) 4.8 - 5.6 % Final    Comment:             Prediabetes: 5.7 - 6.4          Diabetes: >6.4          Glycemic control for adults with diabetes: <7.0          Passed - eGFR in normal range and within 360 days    EGFR (African American)  Date Value Ref Range Status  09/25/2013 >60  Final   GFR calc  Af Amer  Date Value Ref Range Status  03/08/2020 61 >59 mL/min/1.73 Final    Comment:    **In accordance with recommendations from the NKF-ASN Task force,**   [facility] is in the process of updating its eGFR calculation to the   2021 CKD-EPI creatinine equation that estimates kidney function   without a race variable.    EGFR (Non-African Amer.)  Date Value Ref Range Status  09/25/2013 >60  Final    Comment:    eGFR values <29m/min/1.73 m2 may be an indication of chronic kidney disease (CKD). Calculated eGFR is useful in patients with stable renal function. The eGFR calculation will not be reliable in acutely ill patients when serum creatinine is changing rapidly. It is not useful in  patients on dialysis. The eGFR calculation may not be applicable to patients at the low and high extremes of body sizes, pregnant women, and vegetarians.    GFR, Estimated  Date Value Ref Range Status  02/04/2020 >60 >60 mL/min Final    Comment:    (NOTE) Calculated using the CKD-EPI Creatinine Equation (2021)    GFR calc non Af Amer  Date Value Ref Range Status  03/08/2020 53 (L) >59 mL/min/1.73 Final   eGFR  Date Value Ref Range Status  04/12/2021 75 >59 mL/min/1.73 Final         Passed - Valid encounter within last 6  months    Recent Outpatient Visits          4 months ago Acute cough   [place] [place]   4 months ago Cough variant asthma vs Upper Airway cough syndrome   [facility] [place] [facility], [place]   4 months ago Chronic heart failure with preserved ejection fraction ([place]   [place], [place]   4 months ago Type 2 diabetes mellitus treated with insulin ([place]   [place] J[place] T, NP   8 months ago Type 2 diabetes mellitus treated with insulin ([place]   [place] J[place] Faster NP      Future Appointments            Tomorrow [place]ita Lick NP [place] [facility]   In 1 month  [facility]   In 1 month Furth, Cadence H, PA-C [place] [facility]           . atenolol (TENORMIN) 50 MG tablet [Pharmacy Med Name: ATENOLOL 50MG TABLETS] 90 tablet 0    Sig: TAKE 1 TABLET(50 MG) BY MOUTH DAILY     Cardiovascular: Beta Blockers 2 Passed - 08/22/2021  6:29 AM      Passed - Cr in normal range and within 360 days    Creatinine  Date Value Ref Range Status  09/25/2013 0.75 0.60 - 1.30 mg/dL Final   Creatinine, Ser  Date Value Ref Range Status  04/12/2021 0.92 0.57 - 1.00 mg/dL Final         Passed - Last BP in normal range    BP Readings from Last 1 Encounters:  04/09/21 125/77         Passed - Last Heart Rate in normal range    Pulse Readings from Last 1 Encounters:  04/09/21 80         Passed - Valid encounter within last 6 months    Recent Outpatient Visits          4 months ago Acute cough  [facility] [place], Rhe[place] T, NP   4 months ago Cough variant asthma vs Upper Airway cough syndrome   [facility] [employer], Kohler T, NP   4 months ago Chronic heart failure with preserved ejection fraction ([place])   [place], Jolene T, NP   4 months ago Type 2 diabetes mellitus  treated with insulin ([place])   [place], Jolene T, NP   8 months ago Type 2 diabetes mellitus treated with insulin ([place])   [place], Barbaraann Faster, NP      Future Appointments            Tomorrow Venita Lick, NP [place], [facility]   In 1 month  [facility], [facility]   In 1 month Furth, Cadence H, PA-C [place], [facility]

## 2021-08-24 ENCOUNTER — Encounter: Payer: Self-pay | Admitting: Nurse Practitioner

## 2021-08-24 ENCOUNTER — Ambulatory Visit (INDEPENDENT_AMBULATORY_CARE_PROVIDER_SITE_OTHER): Payer: Medicare Other | Admitting: Nurse Practitioner

## 2021-08-24 VITALS — BP 132/74 | HR 78 | Ht 69.0 in | Wt 363.0 lb

## 2021-08-24 DIAGNOSIS — N182 Chronic kidney disease, stage 2 (mild): Secondary | ICD-10-CM

## 2021-08-24 DIAGNOSIS — J45991 Cough variant asthma: Secondary | ICD-10-CM

## 2021-08-24 DIAGNOSIS — E1169 Type 2 diabetes mellitus with other specified complication: Secondary | ICD-10-CM | POA: Diagnosis not present

## 2021-08-24 DIAGNOSIS — Z794 Long term (current) use of insulin: Secondary | ICD-10-CM | POA: Diagnosis not present

## 2021-08-24 DIAGNOSIS — G4733 Obstructive sleep apnea (adult) (pediatric): Secondary | ICD-10-CM | POA: Diagnosis not present

## 2021-08-24 DIAGNOSIS — Z6841 Body Mass Index (BMI) 40.0 and over, adult: Secondary | ICD-10-CM

## 2021-08-24 DIAGNOSIS — N1831 Chronic kidney disease, stage 3a: Secondary | ICD-10-CM

## 2021-08-24 DIAGNOSIS — Z9989 Dependence on other enabling machines and devices: Secondary | ICD-10-CM

## 2021-08-24 DIAGNOSIS — E119 Type 2 diabetes mellitus without complications: Secondary | ICD-10-CM

## 2021-08-24 DIAGNOSIS — E1129 Type 2 diabetes mellitus with other diabetic kidney complication: Secondary | ICD-10-CM | POA: Diagnosis not present

## 2021-08-24 DIAGNOSIS — E785 Hyperlipidemia, unspecified: Secondary | ICD-10-CM

## 2021-08-24 DIAGNOSIS — R809 Proteinuria, unspecified: Secondary | ICD-10-CM | POA: Diagnosis not present

## 2021-08-24 DIAGNOSIS — I428 Other cardiomyopathies: Secondary | ICD-10-CM

## 2021-08-24 DIAGNOSIS — I130 Hypertensive heart and chronic kidney disease with heart failure and stage 1 through stage 4 chronic kidney disease, or unspecified chronic kidney disease: Secondary | ICD-10-CM

## 2021-08-24 DIAGNOSIS — I5032 Chronic diastolic (congestive) heart failure: Secondary | ICD-10-CM | POA: Diagnosis not present

## 2021-08-24 NOTE — Assessment & Plan Note (Signed)
Chronic, ongoing.  Continue current use of O2 at night. She has no concerns today. 

## 2021-08-24 NOTE — Assessment & Plan Note (Signed)
Chronic, ongoing.  Continue collaboration with cardiology and current medication regimen as prescribed by them.   

## 2021-08-24 NOTE — Assessment & Plan Note (Addendum)
Chronic, ongoing.  Followed by endo.  A1c 7.3% with them in July 2023 and urine ALB  47. Continue collaboration with endocrinology.  Continue current medication regimen as ordered by then.  Could consider switch to [facility] in future, Losartan offering kidney protection.  Recommend she continue to monitor BS at home 2-3 times a day.  Refills as needed.

## 2021-08-24 NOTE — Assessment & Plan Note (Signed)
Chronic, ongoing with BP at goal.  Euvolemic today.  Continue current medication regimen and collaboration with cardiology.  Praised for continuing to cut back on pork skins and recommend to continue this.  Recommend: - Reminded to call for an overnight weight gain of >2 pounds or a weekly weight weight of >5 pounds - not adding salt to his food and has been reading food labels. Reviewed the importance of keeping daily sodium intake to 2000mg  daily -- NO PORK SKINS - Avoid Ibuprofen

## 2021-08-24 NOTE — Assessment & Plan Note (Signed)
Chronic, ongoing.  Continue current medication regimen as is at goal.  Lipid panel today. 

## 2021-08-24 NOTE — Assessment & Plan Note (Signed)
Refer to morbid obesity plan of care. 

## 2021-08-24 NOTE — Assessment & Plan Note (Addendum)
Chronic, ongoing.  Followed by endo.  A1c 7.3% with them in July 2023 and urine ALB  47. Continue collaboration with endocrinology.  Continue current medication regimen as ordered by then.  Could consider switch to [facility] in future, Losartan offering kidney protection.  Recommend she continue to monitor BS at home 2-3 times a day.  Refills as needed.  Return in December.

## 2021-08-24 NOTE — Progress Notes (Signed)
BP 132/74   Pulse 78   Ht 5' 9"  (1.753 m)   Wt (!) 363 lb (164.7 kg)   SpO2 91%   BMI 53.61 kg/m    Subjective:    Patient ID: Sara Zhang, female    DOB: 06-09-1970, 51 y.o.   MRN: [id number]  HPI: Sara Zhang is a 51 y.o. female  Chief Complaint  Patient presents with   Diabetes   Hyperlipidemia   Hypertension   Congestive Heart Failure   Chronic Kidney Disease   DIABETES Current medications include Jardiance 25 MG, Toujeo 52 units QHS, and Trulicity 1.5 MG weekly.  A1c in July was 7.3% with endo.  Last saw endocrinology 08/14/21, with no changes made.  She does endorse poor diet on occasion. Polydipsia/polyuria: no Visual disturbance: no Chest pain: no Paresthesias: no Glucose Monitoring: yes             Accucheck frequency:              Fasting glucose: 69 to 120             Evening: 200's at times, usually <200             Before meals: Taking Insulin?: yes             Long acting insulin: 52 units             Short acting insulin: Blood Pressure Monitoring: not checking Retinal Examination: Up to Date -- [facility], Dr. Wallace Going Foot Exam: Up to Date Pneumovax: Up to Date Influenza: Not Up to Date -- refuses Aspirin: no    HYPERTENSION / HYPERLIPIDEMIA/HF Current medications include Losartan (no Lisinopril due to cough), Furosemide, Amlodipine, Atenolol for HTN + Lipitor for HLD.    Had visit Dr. Melvyn Novas with pulmonary last in 11/28/20 -- continues to use O2 every night.  Followed by cardiology and last saw Dr. Saunders Revel 03/14/21, is taking Lasix every day not taking twice a day as recommended at this time.  Last echo in 2020 October was 55-60%. Satisfied with current treatment? yes Duration of hypertension: chronic BP monitoring frequency: not checking BP range:  BP medication side effects: no Duration of hyperlipidemia: chronic Cholesterol medication side effects: no Cholesterol supplements: none Medication compliance: good compliance Aspirin:  no Recent stressors: no Recurrent headaches: no Visual changes: no Palpitations: no Dyspnea: none recently Chest pain: no Lower extremity edema: no Dizzy/lightheaded: no  CHRONIC KIDNEY DISEASE Recent kidney function stable in July with endo. CKD status: stable Medications renally dose: yes Previous renal evaluation: no Pneumovax:  Up to Date Influenza Vaccine:  Not up to Date   Relevant past medical, surgical, family and social history reviewed and updated as indicated. Interim medical history since our last visit reviewed. Allergies and medications reviewed and updated.  Review of Systems  Constitutional:  Negative for activity change, appetite change, diaphoresis, fatigue and fever.  Respiratory:  Negative for cough, chest tightness, shortness of breath (improved) and wheezing.   Cardiovascular:  Positive for leg swelling (at baseline, no worse). Negative for chest pain and palpitations.  Gastrointestinal: Negative.   Endocrine: Negative for polydipsia, polyphagia and polyuria.  Neurological: Negative.   Psychiatric/Behavioral: Negative.      Per HPI unless specifically indicated above     Objective:    BP 132/74   Pulse 78   Ht 5' 9"  (1.753 m)   Wt (!) 363 lb (164.7 kg)   SpO2 91%   BMI 53.61 kg/m  Wt Readings from Last 3 Encounters:  08/24/21 (!) 363 lb (164.7 kg)  04/09/21 (!) 363 lb 3.2 oz (164.7 kg)  04/06/21 (!) 371 lb (168.3 kg)    Physical Exam Vitals and nursing note reviewed.  Constitutional:      General: She is awake. She is not in acute distress.    Appearance: She is well-developed and well-groomed. She is morbidly obese. She is not ill-appearing.  HENT:     Head: Normocephalic.     Right Ear: Hearing normal.     Left Ear: Hearing normal.  Eyes:     General: Lids are normal. No scleral icterus.       Right eye: No discharge.        Left eye: No discharge.     Conjunctiva/sclera: Conjunctivae normal.     Pupils: Pupils are equal, round,  and reactive to light.  Neck:     Thyroid: No thyromegaly.     Vascular: No carotid bruit.  Cardiovascular:     Rate and Rhythm: Normal rate and regular rhythm.     Pulses:          Dorsalis pedis pulses are 1+ on the right side and 1+ on the left side.     Heart sounds: Normal heart sounds. No murmur heard.    No gallop.  Pulmonary:     Effort: Pulmonary effort is normal. No accessory muscle usage or respiratory distress.     Breath sounds: Normal breath sounds. No wheezing or rhonchi.     Comments: Lungs clear throughout. Abdominal:     General: Abdomen is protuberant. Bowel sounds are normal.     Palpations: Abdomen is soft.  Musculoskeletal:     Cervical back: Normal range of motion and neck supple.     Right lower leg: Edema (trace) present.     Left lower leg: Edema (trace) present.  Skin:    General: Skin is warm and dry.     Capillary Refill: Capillary refill takes less than 2 seconds.  Neurological:     Mental Status: She is alert and oriented to person, place, and time.  Psychiatric:        Attention and Perception: Attention normal.        Mood and Affect: Mood and affect normal.        Speech: Speech normal.        Behavior: Behavior normal. Behavior is cooperative.        Thought Content: Thought content normal.     Results for orders placed or performed in visit on 16/10/96  Basic metabolic panel  Result Value Ref Range   Glucose 103 (H) 70 - 99 mg/dL   BUN 15 6 - 24 mg/dL   Creatinine, Ser 0.92 0.57 - 1.00 mg/dL   eGFR 75 >59 mL/min/1.73   BUN/Creatinine Ratio 16 9 - 23   Sodium 142 134 - 144 mmol/L   Potassium 4.3 3.5 - 5.2 mmol/L   Chloride 100 96 - 106 mmol/L   CO2 28 20 - 29 mmol/L   Calcium 8.1 (L) 8.7 - 10.2 mg/dL      Assessment & Plan:   Problem List Items Addressed This Visit       Cardiovascular and Mediastinum   Benign hypertensive heart and kidney disease with CHF and stage 2 chronic kidney disease (HCC)    Chronic, stable with BP at  goal today.  Continue current medication regimen and collaboration with cardiology.  Also recommend checking BP  three mornings a week at home + documenting for providers. Education and recommendation provided today on DASH diet focus and avoidance of high sodium foods, and cutting back on pork skins, which patient states is difficult to do.  DASH diet focus.  Return in December 2023.      Chronic heart failure with preserved ejection fraction (HCC)    Chronic, ongoing with BP at goal.  Euvolemic today.  Continue current medication regimen and collaboration with cardiology.  Praised for continuing to cut back on pork skins and recommend to continue this.  Recommend: - Reminded to call for an overnight weight gain of >2 pounds or a weekly weight weight of >5 pounds - not adding salt to his food and has been reading food labels. Reviewed the importance of keeping daily sodium intake to <2066m daily -- NO PORK SKINS - Avoid Ibuprofen      NICM (nonischemic cardiomyopathy) (HCC)    Chronic, ongoing.  Continue collaboration with cardiology and current medication regimen as prescribed by them.        Respiratory   OSA on CPAP    Chronic, ongoing.  Continue current use of O2 at night. She has no concerns today.        Endocrine   Hyperlipidemia associated with type 2 diabetes mellitus (HCC)    Chronic, ongoing.  Continue current medication regimen as is at goal.  Lipid panel today.      Relevant Medications   Insulin Glargine (BASAGLAR KWIKPEN) 100 UNIT/ML   Other Relevant Orders   Lipid Panel w/o Chol/HDL Ratio   Proteinuria due to type 2 diabetes mellitus (HCC)    Chronic, ongoing.  Followed by endo.  A1c 7.3% with them in July 2023 and urine ALB  47. Continue collaboration with endocrinology.  Continue current medication regimen as ordered by then.  [facility] Losartan offering kidney protection.  Recommend she continue to monitor BS at home 2-3 times a  day.  Refills as needed.        Relevant Medications   Insulin Glargine (BASAGLAR KWIKPEN) 100 UNIT/ML   Type 2 diabetes mellitus treated with insulin (HCC) - Primary    Chronic, ongoing.  Followed by endo.  A1c 7.3% with them in July 2023 and urine ALB  47. Continue collaboration with endocrinology.  Continue current medication regimen as ordered by then.  [facility] Losartan offering kidney protection.  Recommend she continue to monitor BS at home 2-3 times a day.  Refills as needed.  Return in December.      Relevant Medications   Insulin Glargine (BASAGLAR KWIKPEN) 100 UNIT/ML     Genitourinary   CKD (chronic kidney disease) stage 3, GFR 30-59 ml/min (HCC)    Chronic and stable with improved levels recently.  Continue to monitor closely.        Other   BMI 50.0-59.9, adult ([facility]    Refer to morbid obesity plan of care.      Relevant Medications   Insulin Glargine (BASAGLAR KWIKPEN) 100 UNIT/ML   Morbid obesity (HCC)    BMI 53.61 with diabetes, HTN, HF.  Recommended eating smaller high protein, low fat meals more frequently and exercising 30 mins a day 5 times a week with a goal of 10-15lb weight loss in the next 3 months. Patient voiced their understanding and motivation to adhere to these recommendations.       Relevant Medications   Insulin Glargine ([facility]  100 UNIT/ML     Follow up plan: Return in about 5 months (around 01/21/2022) for T2DM, HTN/HLD, HF, ASTHMA, PAP SMEAR due.

## 2021-08-24 NOTE — Assessment & Plan Note (Signed)
Chronic and stable with improved levels recently.  Continue to monitor closely.

## 2021-08-24 NOTE — Assessment & Plan Note (Signed)
BMI 53.61 with diabetes, HTN, HF.  Recommended eating smaller high protein, low fat meals more frequently and exercising 30 mins a day 5 times a week with a goal of 10-15lb weight loss in the next 3 months. Patient voiced their understanding and motivation to adhere to these recommendations.

## 2021-08-24 NOTE — Assessment & Plan Note (Signed)
Chronic, stable with BP at goal today.  Continue current medication regimen and collaboration with cardiology.  Also recommend checking BP three mornings a week at home + documenting for providers. Education and recommendation provided today on DASH diet focus and avoidance of high sodium foods, and cutting back on pork skins, which patient states is difficult to do.  DASH diet focus.  Return in December 2023.

## 2021-08-25 ENCOUNTER — Other Ambulatory Visit: Payer: Self-pay | Admitting: Nurse Practitioner

## 2021-08-25 LAB — LIPID PANEL W/O CHOL/HDL RATIO
Cholesterol, Total: 119 mg/dL (ref 100–199)
HDL: 44 mg/dL (ref >39)
LDL Chol Calc (NIH): 60 mg/dL (ref 0–99)
Triglycerides: 70 mg/dL (ref 0–149)
VLDL Cholesterol Cal: 15 mg/dL (ref 5–40)

## 2021-08-25 NOTE — Progress Notes (Signed)
Contacted via MyChart   Good afternoon Jaiyah, your labs have returned and overall cholesterol levels remain stable.  No changes needed:) Keep being awesome!!  Thank you for allowing me to participate in your care.  I appreciate you. Kindest regards, Sidhant Helderman

## 2021-08-27 NOTE — Telephone Encounter (Signed)
Requested Prescriptions  Pending Prescriptions Disp Refills  . atorvastatin (LIPITOR) 10 MG tablet [Pharmacy Med Name: ATORVASTATIN 10MG  TABLETS] 90 tablet 4    Sig: TAKE 1 TABLET(10 MG) BY MOUTH DAILY     Cardiovascular:  Antilipid - Statins Failed - 08/25/2021 10:01 AM      Failed - Lipid Panel in normal range within the last 12 months    Cholesterol, Total  Date Value Ref Range Status  08/24/2021 119 100 - 199 mg/dL Final   Cholesterol Piccolo, Waived  Date Value Ref Range Status  04/01/2019 100 <200 mg/dL Final    Comment:                            Desirable                <200                         Borderline High      200- 239                         High                     >239    LDL Chol Calc (NIH)  Date Value Ref Range Status  08/24/2021 60 0 - 99 mg/dL Final   HDL  Date Value Ref Range Status  08/24/2021 44 >39 mg/dL Final   Triglycerides  Date Value Ref Range Status  08/24/2021 70 0 - 149 mg/dL Final   Triglycerides Piccolo,Waived  Date Value Ref Range Status  04/01/2019 66 <150 mg/dL Final    Comment:                            Normal                   <150                         Borderline High     150 - 199                         High                200 - 499                         Very High                >499          Passed - Patient is not pregnant      Passed - Valid encounter within last 12 months    Recent Outpatient Visits          3 days ago Type 2 diabetes mellitus treated with insulin (HCC)   [facility] [place], [place] T, NP   4 months ago Acute cough   [facility] [place], [place] T, NP   4 months ago Cough variant asthma vs Upper Airway cough syndrome   [facility] [place], Jolene T, NP   4 months ago Chronic heart failure with preserved ejection fraction (HCC)   [facility] [place], Jolene T, NP   5 months ago Type 2 diabetes mellitus treated with  insulin (HCC)   [facility] [place], Dorie Rank, NP      Future Appointments            In 4 weeks  [facility], [facility]   In 1 month Furth, Cadence H, PA-C [facility] [employer], [facility]   In 4 months [place], Dorie Rank, NP [facility], [facility]

## 2021-08-30 IMAGING — DX DG CHEST 2V
2 series · 2 of 2 positions shown · non-contrast
Comparison: February 02, 2019.

CLINICAL DATA: COVID pneumonia. Cough.

EXAM:
CHEST - 2 VIEW

[chest pa]
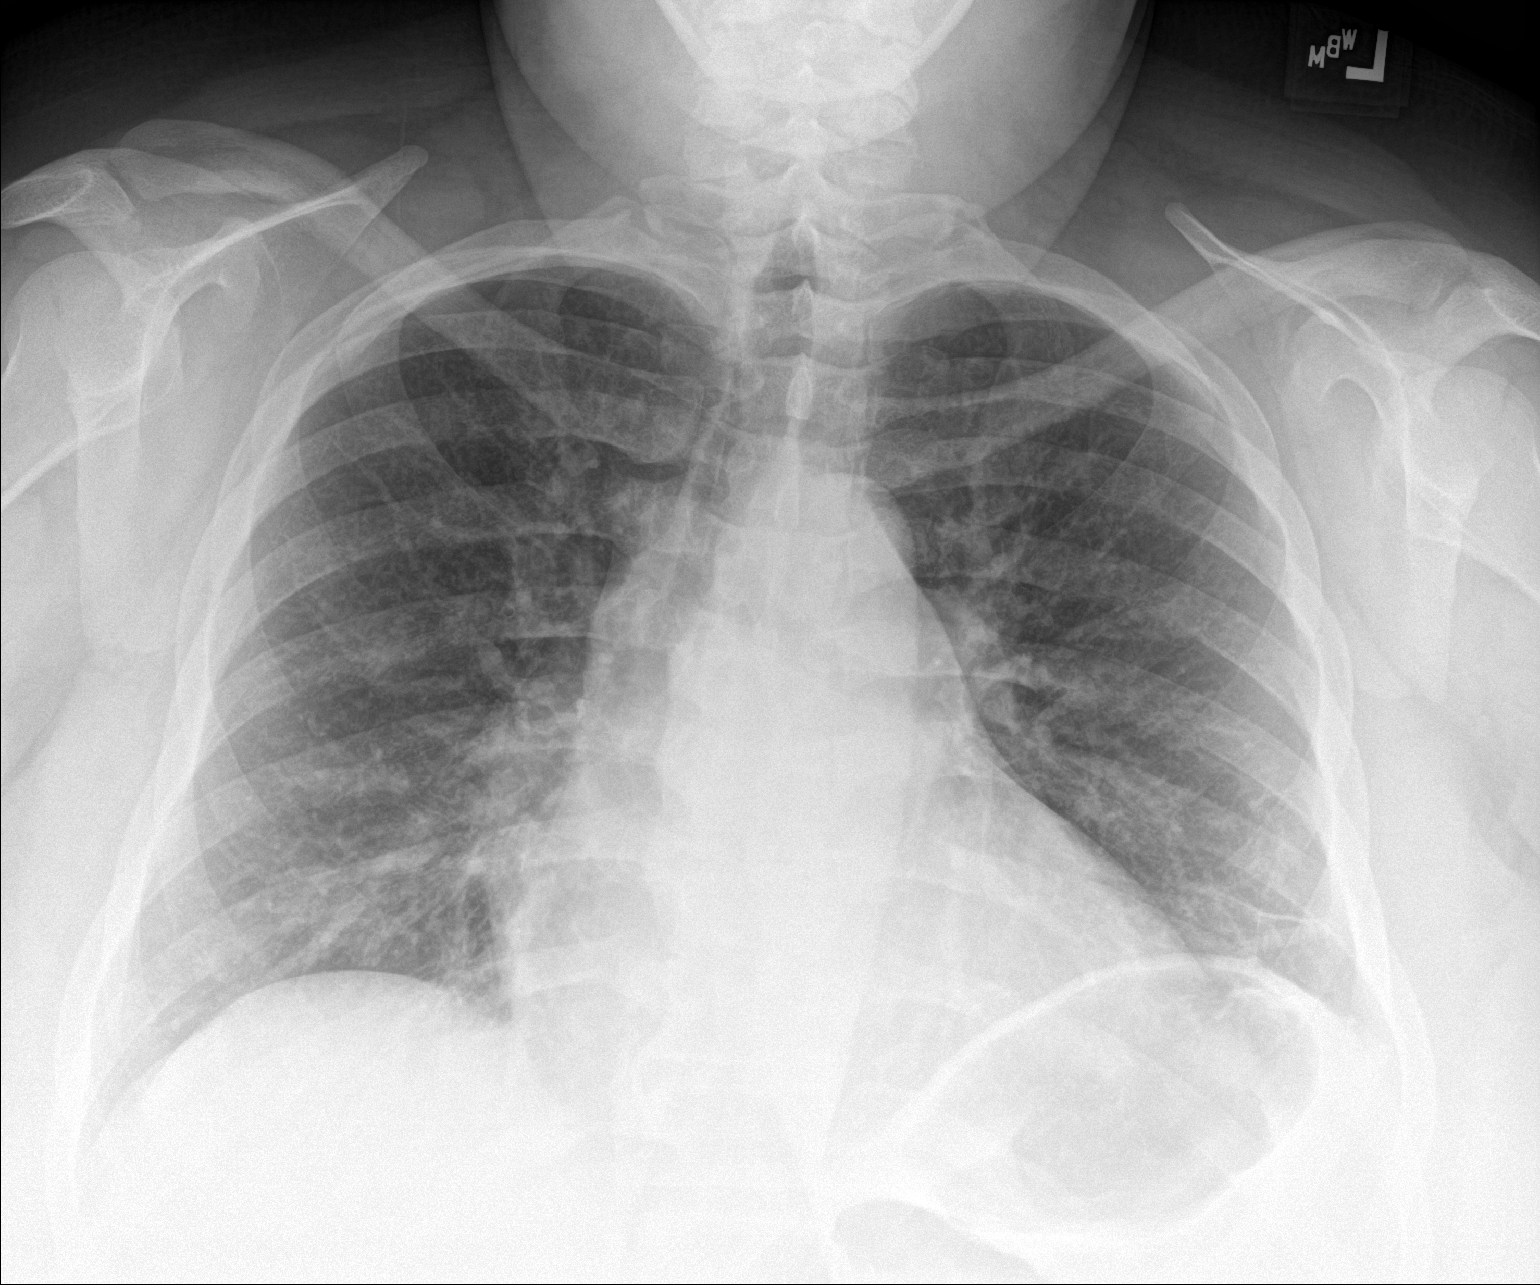

[chest lat]
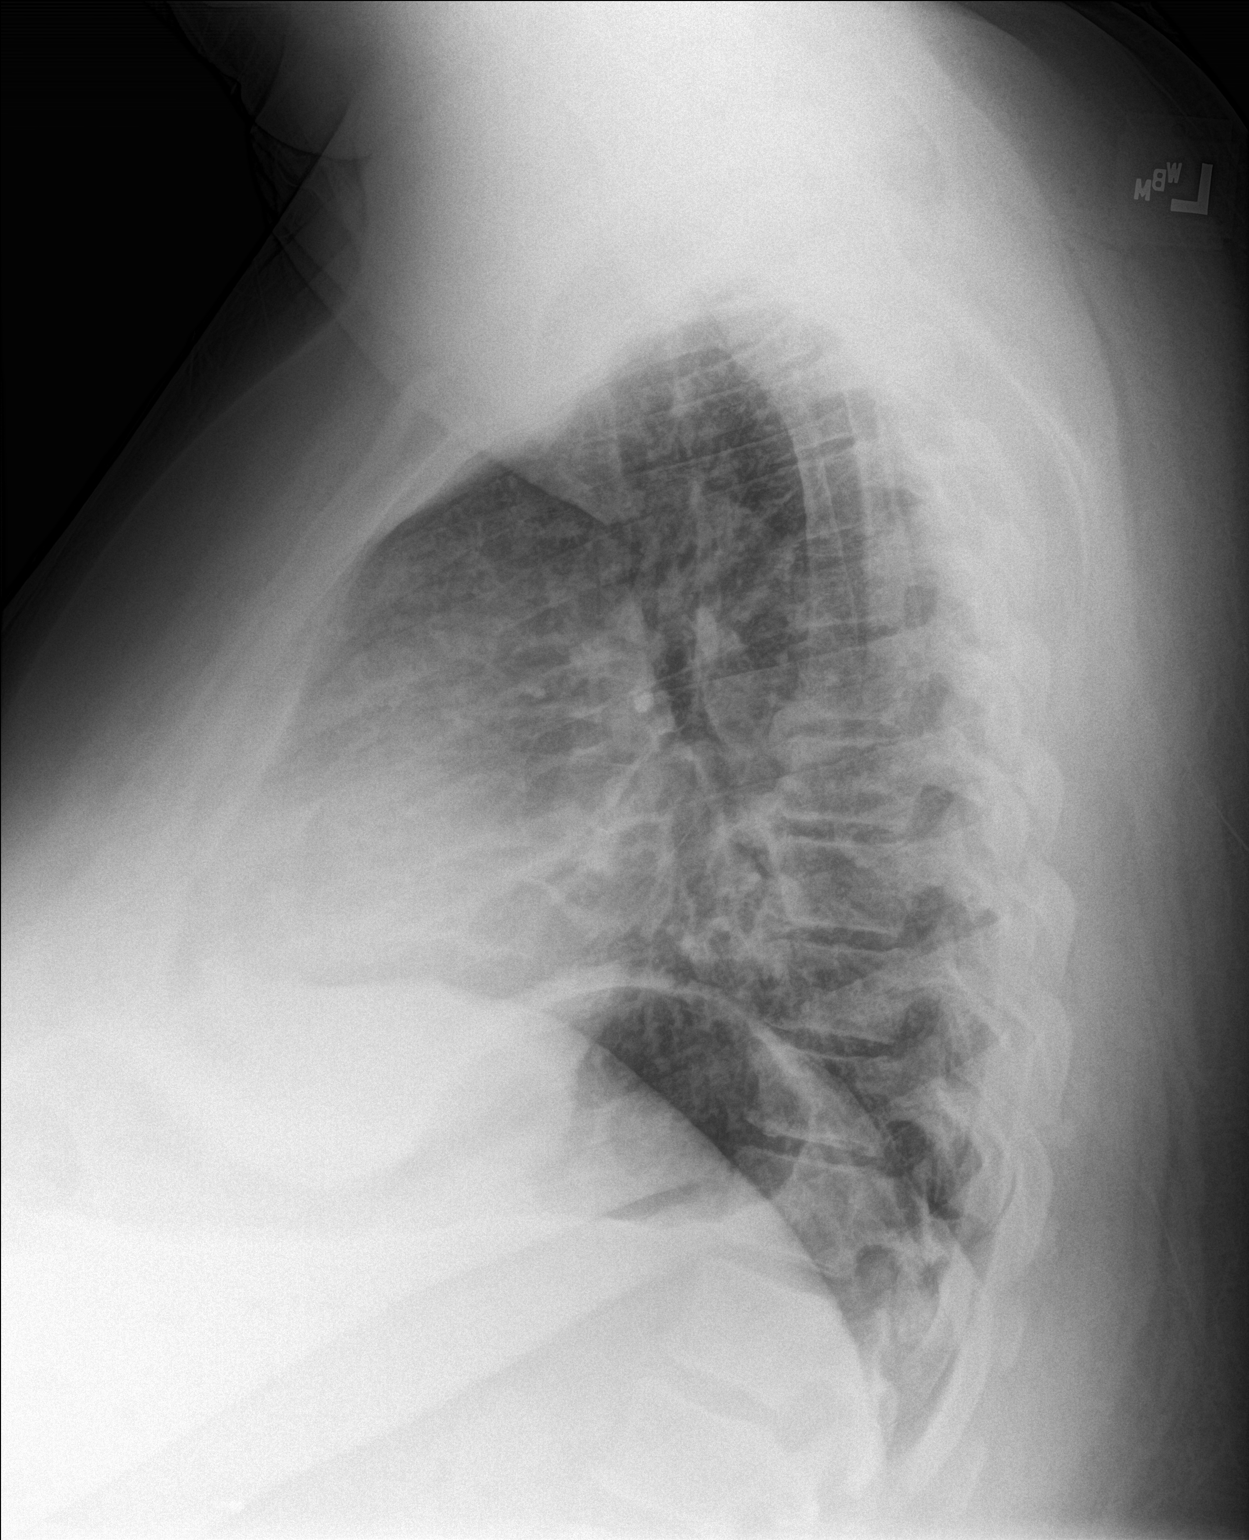

[2 of 2 positions shown; findings below may reference images not displayed]

FINDINGS: The heart size and mediastinal contours are within normal limits. No
pneumothorax or pleural effusion is noted. Right lung is clear.
Minimal left basilar atelectasis or scarring is noted. The
visualized skeletal structures are unremarkable.
IMPRESSION: Minimal left basilar subsegmental atelectasis or scarring.

## 2021-09-10 ENCOUNTER — Telehealth: Payer: Self-pay

## 2021-09-10 NOTE — Chronic Care Management (AMB) (Signed)
Chronic Care Management Pharmacy Assistant   Name: Sara Zhang  MRN: [id number] DOB: 1970/07/19  Reason for Encounter: Disease State Hypertension  Recent office visits:  08/24/21-Jolene T. Ned Card, NP (PCP) General follow up visit. Labs ordered. Follow up in 5 months. 04/17/21-Jolene T. Ned Card, NP (PCP, Virtual visit) Seen for COVID positive. Return if symptoms worsen or fail to improve. 04/09/21-Jolene T. Ned Card, NP (PCP) Seen for a cough. Follow up in 1 week. 04/06/21-Jolene T. Ned Card, NP (PCP) Seen for a cough. Increase Lasix for next 4 days with 60 MG in morning and 40 MG at night. Labs ordered. Chest xray ordered. Follow up in 3 days. 03/27/21-Jolene T. Ned Card, NP (PCP) General follow up visit. Labs ordered. Follow up in 5 months.  Recent consult visits:  08/14/21-Cassandra Jearld Shines, PA (Endocrinology) Diabetic follow up visit. Follow up in 6 months. 03/26/21-Saadat Richardson Dopp, MD (Pulmonology) Seen for Obstructive Sleep Apnea visit. 03/14/21-Christopher End, MD (Cardiology) Seen for heart failure follow up visit. Follow up in 6 months.  Hospital visits:  None in previous 6 months  Medications: Outpatient Encounter Medications as of 09/10/2021  Medication Sig   albuterol (ACCUNEB) 0.63 MG/3ML nebulizer solution Take 3 mLs (0.63 mg total) by nebulization every 6 (six) hours as needed for wheezing.   albuterol (VENTOLIN HFA) 108 (90 Base) MCG/ACT inhaler INHALE 2 PUFFS INTO THE LUNGS EVERY 6 HOURS AS NEEDED FOR WHEEZING OR SHORTNESS OF BREATH   amLODipine (NORVASC) 5 MG tablet TAKE 1 TABLET(5 MG) BY MOUTH DAILY   atenolol (TENORMIN) 50 MG tablet TAKE 1 TABLET(50 MG) BY MOUTH DAILY   atorvastatin (LIPITOR) 10 MG tablet TAKE 1 TABLET(10 MG) BY MOUTH DAILY   Blood Glucose Monitoring Suppl (CONTOUR NEXT MONITOR) w/Device KIT To check blood sugar 4 times daily.   Calcium Carbonate-Vitamin D (OYSTER SHELL CALCIUM 500 + D) 500-125 MG-UNIT TABS Take 1 tablet by mouth  daily.    Cholecalciferol (VITAMIN D3) 10 MCG (400 UNIT) tablet Take 400 Units by mouth daily.   Dulaglutide (TRULICITY) 1.5 KG/8.1EH SOPN Inject 1.5 mg into the skin once a week.   furosemide (LASIX) 40 MG tablet TAKE 1 TABLET(40 MG) BY MOUTH TWICE DAILY   glucose blood (CONTOUR NEXT TEST) test strip TEST BLOOD SUGAR ONCE DAILY   Insulin Glargine (BASAGLAR KWIKPEN) 100 UNIT/ML Inject 52 Units into the skin at bedtime.   Insulin Pen Needle 32G X 4 MM MISC 1 Units by Does not apply route every morning. Pen needles   JARDIANCE 25 MG TABS tablet TAKE 1 TABLET(25 MG) BY MOUTH DAILY   Lancets (ONETOUCH ULTRASOFT) lancets Use as instructed   levonorgestrel (MIRENA) 20 MCG/24HR IUD 1 each by Intrauterine route once.    losartan (COZAAR) 25 MG tablet TAKE 1 TABLET(25 MG) BY MOUTH DAILY   OXYGEN Inhale 2 L into the lungs daily.   No facility-administered encounter medications on file as of 09/10/2021.   Current antihypertensive regimen:  Atenolol 50 mg tabke 1 tab daily Amlodipine 5 mg take 1 tab daily Furosemide 40 mg take 1 tab twice daily  Unsuccessful attempts to complete assessment call. I have called patient 3x and left 3 voicemail's for the patient to return my call when available.    Adherence Review: Is the patient currently on ACE/ARB medication? Yes Does the patient have >5 day gap between last estimated fill dates? No   Care Gaps: INFLUENZA VACCINE:Due since 09/04/2021   Star Rating Drugs: Atorvastatin 10 mg Last UDJSHF:02/63/78 90 DS Trulicity 1.5  mg Last filled:07/04/21 84 DS Jardiance 25 mg Last filled:08/14/21 30 DS Basaglar 100 unit Last filled:07/30/21 83 DS Losartan 25 mg Last filled:08/28/21 90 DS  Myriam Elta [place], [place]

## 2021-09-24 ENCOUNTER — Ambulatory Visit (INDEPENDENT_AMBULATORY_CARE_PROVIDER_SITE_OTHER): Payer: Medicare Other | Admitting: *Deleted

## 2021-09-24 DIAGNOSIS — Z1231 Encounter for screening mammogram for malignant neoplasm of breast: Secondary | ICD-10-CM

## 2021-09-24 DIAGNOSIS — Z0000 Encounter for general adult medical examination without abnormal findings: Secondary | ICD-10-CM | POA: Diagnosis not present

## 2021-09-24 NOTE — Patient Instructions (Addendum)
Sara Zhang , Thank you for taking time to come for your Medicare Wellness Visit. I appreciate your ongoing commitment to your health goals. Please review the following plan we discussed and let me know if I can assist you in the future.   Screening recommendations/referrals: Colonoscopy: up to date Mammogram: Education provided Recommended yearly ophthalmology/optometry visit for glaucoma screening and checkup Recommended yearly dental visit for hygiene and checkup  Vaccinations: Influenza vaccine: Education provided Pneumococcal vaccine: up to date Tdap vaccine: Education provided Shingles vaccine: Education provided    Advanced directives: Education provided  Conditions/risks identified:   Next appointment: 01-21-2022 @ 8:00  [place]   Preventive Care 40-64 Years and Older, Female Preventive care refers to lifestyle choices and visits with your health care provider that can promote health and wellness. What does preventive care include? A yearly physical exam. This is also called an annual well check. Dental exams once or twice a year. Routine eye exams. Ask your health care provider how often you should have your eyes checked. Personal lifestyle choices, including: Daily care of your teeth and gums. Regular physical activity. Eating a healthy diet. Avoiding tobacco and drug use. Limiting alcohol use. Practicing safe sex. Taking low-dose aspirin every day. Taking vitamin and mineral supplements as recommended by your health care provider. What happens during an annual well check? The services and screenings done by your health care provider during your annual well check will depend on your age, overall health, lifestyle risk factors, and family history of disease. Counseling  Your health care provider may ask you questions about your: Alcohol use. Tobacco use. Drug use. Emotional well-being. Home and relationship well-being. Sexual activity. Eating habits. History  of falls. Memory and ability to understand (cognition). Work and work Astronomer. Reproductive health. Screening  You may have the following tests or measurements: Height, weight, and BMI. Blood pressure. Lipid and cholesterol levels. These may be checked every 5 years, or more frequently if you are over 57 years old. Skin check. Lung cancer screening. You may have this screening every year starting at age 38 if you have a 30-pack-year history of smoking and currently smoke or have quit within the past 15 years. Fecal occult blood test (FOBT) of the stool. You may have this test every year starting at age 48. Flexible sigmoidoscopy or colonoscopy. You may have a sigmoidoscopy every 5 years or a colonoscopy every 10 years starting at age 49. Hepatitis C blood test. Hepatitis B blood test. Sexually transmitted disease (STD) testing. Diabetes screening. This is done by checking your blood sugar (glucose) after you have not eaten for a while (fasting). You may have this done every 1-3 years. Bone density scan. This is done to screen for osteoporosis. You may have this done starting at age 25. Mammogram. This may be done every 1-2 years. Talk to your health care provider about how often you should have regular mammograms. Talk with your health care provider about your test results, treatment options, and if necessary, the need for more tests. Vaccines  Your health care provider may recommend certain vaccines, such as: Influenza vaccine. This is recommended every year. Tetanus, diphtheria, and acellular pertussis (Tdap, Td) vaccine. You may need a Td booster every 10 years. Zoster vaccine. You may need this after age 71. Pneumococcal 13-valent conjugate (PCV13) vaccine. One dose is recommended after age 86. Pneumococcal polysaccharide (PPSV23) vaccine. One dose is recommended after age 71. Talk to your health care provider about which screenings and vaccines you need  and how often you need  them. This information is not intended to replace advice given to you by your health care provider. Make sure you discuss any questions you have with your health care provider. Document Released: 02/17/2015 Document Revised: 10/11/2015 Document Reviewed: 11/22/2014 Elsevier Interactive Patient Education  2017 [place] Prevention in the Home Falls can cause injuries. They can happen to people of all ages. There are many things you can do to make your home safe and to help prevent falls. What can I do on the outside of my home? Regularly fix the edges of walkways and driveways and fix any cracks. Remove anything that might make you trip as you walk through a door, such as a raised step or threshold. Trim any bushes or trees on the path to your home. Use bright outdoor lighting. Clear any walking paths of anything that might make someone trip, such as rocks or tools. Regularly check to see if handrails are loose or broken. Make sure that both sides of any steps have handrails. Any raised decks and porches should have guardrails on the edges. Have any leaves, snow, or ice cleared regularly. Use sand or salt on walking paths during winter. Clean up any spills in your garage right away. This includes oil or grease spills. What can I do in the bathroom? Use night lights. Install grab bars by the toilet and in the tub and shower. Do not use towel bars as grab bars. Use non-skid mats or decals in the tub or shower. If you need to sit down in the shower, use a plastic, non-slip stool. Keep the floor dry. Clean up any water that spills on the floor as soon as it happens. Remove soap buildup in the tub or shower regularly. Attach bath mats securely with double-sided non-slip rug tape. Do not have throw rugs and other things on the floor that can make you trip. What can I do in the bedroom? Use night lights. Make sure that you have a light by your bed that is easy to reach. Do not use  any sheets or blankets that are too big for your bed. They should not hang down onto the floor. Have a firm chair that has side arms. You can use this for support while you get dressed. Do not have throw rugs and other things on the floor that can make you trip. What can I do in the kitchen? Clean up any spills right away. Avoid walking on wet floors. Keep items that you use a lot in easy-to-reach places. If you need to reach something above you, use a strong step stool that has a grab bar. Keep electrical cords out of the way. Do not use floor polish or wax that makes floors slippery. If you must use wax, use non-skid floor wax. Do not have throw rugs and other things on the floor that can make you trip. What can I do with my stairs? Do not leave any items on the stairs. Make sure that there are handrails on both sides of the stairs and use them. Fix handrails that are broken or loose. Make sure that handrails are as long as the stairways. Check any carpeting to make sure that it is firmly attached to the stairs. Fix any carpet that is loose or worn. Avoid having throw rugs at the top or bottom of the stairs. If you do have throw rugs, attach them to the floor with carpet tape. Make sure that you  have a light switch at the top of the stairs and the bottom of the stairs. If you do not have them, ask someone to add them for you. What else can I do to help prevent falls? Wear shoes that: Do not have high heels. Have rubber bottoms. Are comfortable and fit you well. Are closed at the toe. Do not wear sandals. If you use a stepladder: Make sure that it is fully opened. Do not climb a closed stepladder. Make sure that both sides of the stepladder are locked into place. Ask someone to hold it for you, if possible. Clearly mark and make sure that you can see: Any grab bars or handrails. First and last steps. Where the edge of each step is. Use tools that help you move around (mobility aids)  if they are needed. These include: Canes. Walkers. Scooters. Crutches. Turn on the lights when you go into a dark area. Replace any light bulbs as soon as they burn out. Set up your furniture so you have a clear path. Avoid moving your furniture around. If any of your floors are uneven, fix them. If there are any pets around you, be aware of where they are. Review your medicines with your doctor. Some medicines can make you feel dizzy. This can increase your chance of falling. Ask your doctor what other things that you can do to help prevent falls. This information is not intended to replace advice given to you by your health care provider. Make sure you discuss any questions you have with your health care provider. Document Released: 11/17/2008 Document Revised: 06/29/2015 Document Reviewed: 02/25/2014 Elsevier Interactive Patient Education  2017 [employer].

## 2021-09-24 NOTE — Progress Notes (Signed)
Subjective:   ADELIN VENTRELLA is a 51 y.o. female who presents for Medicare Annual (Subsequent) preventive examination.  I connected with  TASHENA IBACH on 09/24/21 by a  telephone enabled telemedicine application and verified that I am speaking with the correct person using two identifiers.   I discussed the limitations of evaluation and management by telemedicine. The patient expressed understanding and agreed to proceed.  Patient location: home  Provider location: Tele-Health-home    Review of Systems      Cardiac Risk Factors include: advanced age (>20mn, >>56women);diabetes mellitus;sedentary lifestyle;obesity (BMI >30kg/m2)     Objective:    Today's Vitals   There is no height or weight on file to calculate BMI.     09/24/2021    8:38 AM 09/22/2020    8:15 AM 02/04/2020    8:40 PM 09/20/2019    8:16 AM 02/03/2019    6:37 AM 02/03/2019    6:33 AM 02/02/2019    9:27 PM  Advanced Directives  Does Patient Have a Medical Advance Directive? No No No No  No No  Would patient like information on creating a medical advance directive? No - Patient declined  No - Patient declined  No - Patient declined      Current Medications (verified) Outpatient Encounter Medications as of 09/24/2021  Medication Sig   albuterol (ACCUNEB) 0.63 MG/3ML nebulizer solution Take 3 mLs (0.63 mg total) by nebulization every 6 (six) hours as needed for wheezing.   amLODipine (NORVASC) 5 MG tablet TAKE 1 TABLET(5 MG) BY MOUTH DAILY   atenolol (TENORMIN) 50 MG tablet TAKE 1 TABLET(50 MG) BY MOUTH DAILY   atorvastatin (LIPITOR) 10 MG tablet TAKE 1 TABLET(10 MG) BY MOUTH DAILY   Blood Glucose Monitoring Suppl (CONTOUR NEXT MONITOR) w/Device KIT To check blood sugar 4 times daily.   Calcium Carbonate-Vitamin D (OYSTER SHELL CALCIUM 500 + D) 500-125 MG-UNIT TABS Take 1 tablet by mouth daily.    Cholecalciferol (VITAMIN D3) 10 MCG (400 UNIT) tablet Take 400 Units by mouth daily.   Dulaglutide  (TRULICITY) 1.5 MDV/7.6HYSOPN Inject 1.5 mg into the skin once a week.   furosemide (LASIX) 40 MG tablet TAKE 1 TABLET(40 MG) BY MOUTH TWICE DAILY   glucose blood (CONTOUR NEXT TEST) test strip TEST BLOOD SUGAR ONCE DAILY   Insulin Glargine (BASAGLAR KWIKPEN) 100 UNIT/ML Inject 52 Units into the skin at bedtime.   Insulin Pen Needle 32G X 4 MM MISC 1 Units by Does not apply route every morning. Pen needles   JARDIANCE 25 MG TABS tablet TAKE 1 TABLET(25 MG) BY MOUTH DAILY   Lancets (ONETOUCH ULTRASOFT) lancets Use as instructed   levonorgestrel (MIRENA) 20 MCG/24HR IUD 1 each by Intrauterine route once.    losartan (COZAAR) 25 MG tablet TAKE 1 TABLET(25 MG) BY MOUTH DAILY   OXYGEN Inhale 2 L into the lungs daily.   albuterol (VENTOLIN HFA) 108 (90 Base) MCG/ACT inhaler INHALE 2 PUFFS INTO THE LUNGS EVERY 6 HOURS AS NEEDED FOR WHEEZING OR SHORTNESS OF BREATH   No facility-administered encounter medications on file as of 09/24/2021.    Allergies (verified) Patient has no known allergies.   History: Past Medical History:  Diagnosis Date   Abnormal uterine bleeding (AUB) 11/25/2016   Chronic diastolic CHF (congestive heart failure) ([place]    a. 02/2012 Echo: EF 25-35%, glob HK; b. 06/2016 Echo: EF 55-60%, mild MR, nl PASP.   CKD (chronic kidney disease), stage II    COVID-19  Diabetes mellitus without complication ([place])    Extreme obesity    Hyperlipidemia    Hypertension    Hypoxia    Microcytic anemia    Pneumonia    Pneumonia    Sleep apnea    Status post endometrial ablation 06/20/2016   2015 hysteroscopy/D&C with NovaSure endometrial ablation; pathology-benign endometrial polyps   Thickened endometrium 06/20/2016   Ultrasound-22.3 mm endometrial stripe, heterogenous   Vaginal bleeding    a. 01/2017 s/p hyteroscopy and polypectomy [facility]).   Vitamin D deficiency    Past Surgical History:  Procedure Laterality Date   CESAREAN SECTION     CHOLECYSTECTOMY     DILATION AND  CURETTAGE, DIAGNOSTIC / THERAPEUTIC     TONSILLECTOMY AND ADENOIDECTOMY     TOOTH EXTRACTION  01/2016   Family History  Problem Relation Age of Onset   Diabetes Mother    Hyperlipidemia Mother    Hypertension Mother    Stroke Mother    Arthritis Father    Asthma Father    Diabetes Father    Hypertension Father    Hypertension Brother    Diabetes Brother    Diabetes Maternal Grandmother    Hypertension Maternal Grandmother    Diabetes Maternal Grandfather    Hypertension Maternal Grandfather    Diabetes Paternal Grandmother    Hypertension Paternal Grandmother    Heart attack Maternal Uncle    Breast cancer Neg Hx    Social History   Socioeconomic History   Marital status: Single    Spouse name: Not on file   Number of children: Not on file   Years of education: GED   Highest education level: GED or equivalent  Occupational History   Occupation: disability  Tobacco Use   Smoking status: Never   Smokeless tobacco: Never  Vaping Use   Vaping Use: Never used  Substance and Sexual Activity   Alcohol use: Yes    Alcohol/week: 0.0 standard drinks of alcohol    Comment: 1-2 beers/month   Drug use: No   Sexual activity: Yes    Birth control/protection: None  Other Topics Concern   Not on file  Social History Narrative   The patient's son lives with her.  The patient recently lost an aunt in December 2020 and feels she got C19 related to the amount of family and friends coming in and out to pay respects to her aunt.    Social Determinants of Health   Financial Resource Strain: Low Risk  (09/24/2021)   Overall Financial Resource Strain (CARDIA)    Difficulty of Paying Living Expenses: Not hard at all  Food Insecurity: No Food Insecurity (09/24/2021)   Hunger Vital Sign    Worried About Running Out of Food in the Last Year: Never true    Ran Out of Food in the Last Year: Never true  Transportation Needs: No Transportation Needs (09/24/2021)   PRAPARE - Armed forces logistics/support/administrative officer (Medical): No    Lack of Transportation (Non-Medical): No  Physical Activity: Inactive (09/24/2021)   Exercise Vital Sign    Days of Exercise per Week: 0 days    Minutes of Exercise per Session: 0 min  Stress: No Stress Concern Present (09/24/2021)   [place]    Feeling of Stress : Not at all  Social Connections: Unknown (09/24/2021)   Social Connection and Isolation Panel [NHANES]    Frequency of Communication with Friends and Family: More than three  times a week    Frequency of Social Gatherings with Friends and Family: Once a week    Attends Religious Services: Never    Marine scientist or Organizations: No    Attends Music therapist: Never    Marital Status: Not on file    Tobacco Counseling Counseling given: Not Answered   Clinical Intake:  Pre-visit preparation completed: Yes  Pain : No/denies pain     Nutritional Risks: None Diabetes: Yes CBG done?: No Did pt. bring in CBG monitor from home?: No  How often do you need to have someone help you when you read instructions, pamphlets, or other written materials from your doctor or pharmacy?: 1 - Never  Diabetic?  Yes  Nutrition Risk Assessment:  Has the patient had any N/V/D within the last 2 months?  No  Does the patient have any non-healing wounds?  No  Has the patient had any unintentional weight loss or weight gain?  No   Diabetes:  Is the patient diabetic?  Yes  If diabetic, was a CBG obtained today?  No  Did the patient bring in their glucometer from home?  No  How often do you monitor your CBG's? 3 x day.   Financial Strains and Diabetes Management:  Are you having any financial strains with the device, your supplies or your medication? .  Does the patient want to be seen by Chronic Care Management for management of their diabetes?  No  Would the patient like to be referred to a Nutritionist  or for Diabetic Management?  No   Diabetic Exams:  Diabetic Eye Exam: Completed . Pt has been advised about the importance in completing this exam  Diabetic Foot Exam: Pt has been advised about the importance in completing this exam.  Interpreter Needed?: No  Information entered by :: Leroy Kennedy LPN   Activities of Daily Living    09/24/2021    8:51 AM  In your present state of health, do you have any difficulty performing the following activities:  Hearing? 0  Vision? 0  Difficulty concentrating or making decisions? 0  Walking or climbing stairs? 0  Dressing or bathing? 0  Doing errands, shopping? 0  Preparing Food and eating ? N  Using the Toilet? N  In the past six months, have you accidently leaked urine? N  Do you have problems with loss of bowel control? N  Managing your Medications? N  Managing your Finances? N  Housekeeping or managing your Housekeeping? N    Patient Care Team: Venita Lick, NP as PCP - General (Nurse Practitioner) End, Harrell Gave, MD as PCP - Cardiology (Cardiology) Sharlotte Alamo, DPM (Podiatry) Defrancesco, Alanda Slim, MD as Consulting Physician (Obstetrics and Gynecology) Leandrew Koyanagi, MD as Referring Physician (Ophthalmology)  Indicate any recent Medical Services you may have received from other than [facility] providers in the past year (date may be approximate).     Assessment:   This is a routine wellness examination for Alaia.  Hearing/Vision screen Hearing Screening - Comments:: No trouble hearing  Vision Screening - Comments:: Up to date [facility]  Dietary issues and exercise activities discussed: Current Exercise Habits: The patient does not participate in regular exercise at present   Goals Addressed             This Visit's Progress    Weight (lb) < 200 lb (90.7 kg)         Depression Screen    09/24/2021  8:43 AM 08/24/2021    8:15 AM 03/27/2021    9:12 AM 09/22/2020    8:17 AM 08/01/2020    2:17 PM  03/08/2020    9:19 AM 02/09/2020   10:28 AM  PHQ 2/9 Scores  PHQ - 2 Score 0 0 1 0 0 0 0  PHQ- 9 Score 1 1 2         Fall Risk    09/24/2021    8:38 AM 08/24/2021    8:15 AM 03/27/2021    9:12 AM 09/22/2020    8:16 AM 08/01/2020    2:17 PM  Fall Risk   Falls in the past year? 0 0 0 1 0  Comment    tripped   Number falls in past yr: 0 0 0 0 0  Injury with Fall? 0 0 0 0 0  Risk for fall due to :  No Fall Risks No Fall Risks Medication side effect No Fall Risks  Follow up Falls evaluation completed;Education provided;Falls prevention discussed Falls evaluation completed Falls evaluation completed Falls evaluation completed;Education provided;Falls prevention discussed Falls evaluation completed    FALL RISK PREVENTION PERTAINING TO THE HOME:  Any stairs in or around the home? Yes  If so, are there any without handrails? No  Home free of loose throw rugs in walkways, pet beds, electrical cords, etc? Yes  Adequate lighting in your home to reduce risk of falls? Yes   ASSISTIVE DEVICES UTILIZED TO PREVENT FALLS:  Life alert? No  Use of a cane, walker or w/c? No  Grab bars in the bathroom? No  Shower chair or bench in shower? No  Elevated toilet seat or a handicapped toilet? No   TIMED UP AND GO:  Was the test performed?  no .    Cognitive Function:        09/24/2021    8:40 AM 09/22/2020    8:19 AM 09/20/2019    8:19 AM 08/13/2017    8:22 AM 08/08/2016    8:18 AM  6CIT Screen  What Year? 0 points 0 points 0 points 0 points 0 points  What month? 0 points 0 points 0 points 0 points 0 points  What time? 0 points 0 points 0 points 0 points 0 points  Count back from 20 0 points 0 points 0 points 0 points 0 points  Months in reverse 0 points 0 points 0 points 0 points 0 points  Repeat phrase 0 points 0 points 0 points 0 points 0 points  Total Score 0 points 0 points 0 points 0 points 0 points    Immunizations Immunization History  Administered Date(s) Administered    PFIZER(Purple Top)SARS-COV-2 Vaccination 09/09/2019, 10/06/2019   Pneumococcal Polysaccharide-23 01/12/2007, 04/18/2009   Tdap 07/07/2007    TDAP status: Due, Education has been provided regarding the importance of this vaccine. Advised may receive this vaccine at local pharmacy or Health Dept. Aware to provide a copy of the vaccination record if obtained from local pharmacy or Health Dept. Verbalized acceptance and understanding.  Flu Vaccine status: Declined, Education has been provided regarding the importance of this vaccine but patient still declined. Advised may receive this vaccine at local pharmacy or Health Dept. Aware to provide a copy of the vaccination record if obtained from local pharmacy or Health Dept. Verbalized acceptance and understanding.  Pneumococcal vaccine status: Up to date  Covid-19 vaccine status: Information provided on how to obtain vaccines.   Qualifies for Shingles Vaccine? Yes   Zostavax completed No  Shingrix Completed?: No.    Education has been provided regarding the importance of this vaccine. Patient has been advised to call insurance company to determine out of pocket expense if they have not yet received this vaccine. Advised may also receive vaccine at local pharmacy or Health Dept. Verbalized acceptance and understanding.  Screening Tests Health Maintenance  Topic Date Due   INFLUENZA VACCINE  09/04/2021   HEMOGLOBIN A1C  09/24/2021   COVID-19 Vaccine (3 - Pfizer risk series) 10/10/2021 (Originally 11/03/2019)   Zoster Vaccines- Shingrix (1 of 2) 11/24/2021 (Originally 12/13/1987)   TETANUS/TDAP  08/25/2022 (Originally 07/06/2017)   OPHTHALMOLOGY EXAM  12/07/2021   FOOT EXAM  12/25/2021   PAP SMEAR-Modifier  01/20/2022   MAMMOGRAM  01/25/2022   Fecal DNA (Cologuard)  03/27/2023   Hepatitis C Screening  Completed   HIV Screening  Completed   HPV VACCINES  Aged Out    Health Maintenance  Health Maintenance Due  Topic Date Due   INFLUENZA  VACCINE  09/04/2021   HEMOGLOBIN A1C  09/24/2021    Colorectal cancer screening: Type of screening: Cologuard. Completed 2022. Repeat every 3 years  Mammogram status: Ordered  . Pt provided with contact info and advised to call to schedule appt.   Bone Density  not indicated  Lung Cancer Screening: (Low Dose CT Chest recommended if Age 67-80 years, 30 pack-year currently smoking OR have quit w/in 15years.) does not qualify.   Lung Cancer Screening Referral:   does not qualify  Additional Screening:  Hepatitis C Screening: does not qualify; Completed 2022  Vision Screening: Recommended annual ophthalmology exams for early detection of glaucoma and other disorders of the eye. Is the patient up to date with their annual eye exam?  Yes  Who is the provider or what is the name of the office in which the patient attends annual eye exams? [facility] If pt is not established with a provider, would they like to be referred to a provider to establish care? No .   Dental Screening: Recommended annual dental exams for proper oral hygiene  Community Resource Referral / Chronic Care Management: CRR required this visit?  No   CCM required this visit?  No      Plan:     I have personally reviewed and noted the following in the patient's chart:   Medical and social history Use of alcohol, tobacco or illicit drugs  Current medications and supplements including opioid prescriptions. Patient is not currently taking opioid prescriptions. Functional ability and status Nutritional status Physical activity Advanced directives List of other physicians Hospitalizations, surgeries, and ER visits in previous 12 months Vitals Screenings to include cognitive, depression, and falls Referrals and appointments  In addition, I have reviewed and discussed with patient certain preventive protocols, quality metrics, and best practice recommendations. A written personalized care plan for preventive  services as well as general preventive health recommendations were provided to patient.     Leroy Kennedy, LPN   05/23/3788   Nurse Notes:

## 2021-09-26 ENCOUNTER — Encounter: Payer: Self-pay | Admitting: Medical

## 2021-09-26 ENCOUNTER — Ambulatory Visit (INDEPENDENT_AMBULATORY_CARE_PROVIDER_SITE_OTHER): Payer: Medicare Other | Admitting: Medical

## 2021-09-26 VITALS — BP 128/70 | HR 76 | Ht 69.0 in | Wt 365.0 lb

## 2021-09-26 DIAGNOSIS — I5032 Chronic diastolic (congestive) heart failure: Secondary | ICD-10-CM

## 2021-09-26 DIAGNOSIS — E782 Mixed hyperlipidemia: Secondary | ICD-10-CM

## 2021-09-26 DIAGNOSIS — I10 Essential (primary) hypertension: Secondary | ICD-10-CM | POA: Diagnosis not present

## 2021-09-26 NOTE — Patient Instructions (Signed)
Medication Instructions:  Your physician recommends that you continue on your current medications as directed. Please refer to the Current Medication list given to you today.  *If you need a refill on your cardiac medications before your next appointment, please call your pharmacy*   Lab Work: None ordered If you have labs (blood work) drawn today and your tests are completely normal, you will receive your results only by: MyChart Message (if you have MyChart) OR A paper copy in the mail If you have any lab test that is abnormal or we need to change your treatment, we will call you to review the results.   Testing/Procedures: None ordered   Follow-Up: At [facility], you and your health needs are our priority.  As part of our continuing mission to provide you with exceptional heart care, we have created designated Provider Care Teams.  These Care Teams include your primary Cardiologist (physician) and Advanced Practice Providers (APPs -  Physician Assistants and Nurse Practitioners) who all work together to provide you with the care you need, when you need it.  We recommend signing up for the patient portal called "MyChart".  Sign up information is provided on this After Visit Summary.  MyChart is used to connect with patients for Virtual Visits (Telemedicine).  Patients are able to view lab/test results, encounter notes, upcoming appointments, etc.  Non-urgent messages can be sent to your provider as well.   To learn more about what you can do with MyChart, go to [email or web address].    Your next appointment:   6 month(s)  The format for your next appointment:   In Person  Provider:   You may see Christopher End, MD or one of the following Advanced Practice Providers on your designated Care Team:   Christopher Berge, NP Ryan Dunn, PA-C Cadence Furth, PA-C   Other Instructions N/A  Important Information About Sugar       

## 2021-09-26 NOTE — Progress Notes (Addendum)
Cardiology Office Note:    Date:  09/26/2021   ID:  Sara Zhang, DOB 1970-09-09, MRN [id number]  PCP:  Venita Lick, NP  [facility] Cardiologist:  Nelva Bush, MD  [facility] Electrophysiologist:  None   Referring MD: Venita Lick, NP   Chief Complaint: 6 month follow-up  History of Present Illness:    Sara Zhang is a 51 y.o. female with a hx of HFrEF LVEF 25-30% in 2014 with subsequent normalization, HTN, HLD, OSA on CPAP, DM2 who presetns for follow-up.   Echo in 11/2018 showed LVEF 55-60%, mild LVH, mildly dilated atrium.  Last seen 03/2021 for heart failure follow-up. She was taking lasix 48mBID with improved symptoms.   Today, the patient is overall doing well. No changes in the last 6 months. No chest pain. She has unchanged SOB. She has occasinoal LLE. She takes 2 fluid pills most of the time, but sometimes has to cut back to 1 when she goes somewhere. No orthopnea, pnd, lightheadenss, palpitations. She is tolerating medications. She uses CPAP every night.   Past Medical History:  Diagnosis Date   Abnormal uterine bleeding (AUB) 11/25/2016   Chronic diastolic CHF (congestive heart failure) ([place]    a. 02/2012 Echo: EF 25-35%, glob HK; b. 06/2016 Echo: EF 55-60%, mild MR, nl PASP.   CKD (chronic kidney disease), stage II    COVID-19    Diabetes mellitus without complication ([place]    Extreme obesity    Hyperlipidemia    Hypertension    Hypoxia    Microcytic anemia    Pneumonia    Pneumonia    Sleep apnea    Status post endometrial ablation 06/20/2016   2015 hysteroscopy/D&C with NovaSure endometrial ablation; pathology-benign endometrial polyps   Thickened endometrium 06/20/2016   Ultrasound-22.3 mm endometrial stripe, heterogenous   Vaginal bleeding    a. 01/2017 s/p [facility]teroscopy and polypectomy ([facility].   Vitamin D deficiency     Past Surgical History:  Procedure Laterality Date   CESAREAN SECTION     CHOLECYSTECTOMY     DILATION AND  CURETTAGE, DIAGNOSTIC / THERAPEUTIC     TONSILLECTOMY AND ADENOIDECTOMY     TOOTH EXTRACTION  01/2016    Current Medications: Current Meds  Medication Sig   amLODipine (NORVASC) 5 MG tablet TAKE 1 TABLET(5 MG) BY MOUTH DAILY   atenolol (TENORMIN) 50 MG tablet TAKE 1 TABLET(50 MG) BY MOUTH DAILY   atorvastatin (LIPITOR) 10 MG tablet TAKE 1 TABLET(10 MG) BY MOUTH DAILY   Blood Glucose Monitoring Suppl (CONTOUR NEXT MONITOR) w/Device KIT To check blood sugar 4 times daily.   Calcium Carbonate-Vitamin D (OYSTER SHELL CALCIUM 500 + D) 500-125 MG-UNIT TABS Take 1 tablet by mouth daily.    Cholecalciferol (VITAMIN D3) 10 MCG (400 UNIT) tablet Take 400 Units by mouth daily.   Dulaglutide (TRULICITY) 1.5 MOQ/9.4TMSOPN Inject 1.5 mg into the skin once a week.   furosemide (LASIX) 40 MG tablet TAKE 1 TABLET(40 MG) BY MOUTH TWICE DAILY   glucose blood (CONTOUR NEXT TEST) test strip TEST BLOOD SUGAR ONCE DAILY   Insulin Glargine (BASAGLAR KWIKPEN) 100 UNIT/ML Inject 52 Units into the skin at bedtime.   Insulin Pen Needle 32G X 4 MM MISC 1 Units by Does not apply route every morning. Pen needles   JARDIANCE 25 MG TABS tablet TAKE 1 TABLET(25 MG) BY MOUTH DAILY   Lancets (ONETOUCH ULTRASOFT) lancets Use as instructed   levonorgestrel (MIRENA) 20 MCG/24HR IUD 1 each  by Intrauterine route once.    losartan (COZAAR) 25 MG tablet TAKE 1 TABLET(25 MG) BY MOUTH DAILY   OXYGEN Inhale 2 L into the lungs daily.     Allergies:   Patient has no known allergies.   Social History   Socioeconomic History   Marital status: Single    Spouse name: Not on file   Number of children: Not on file   Years of education: GED   Highest education level: GED or equivalent  Occupational History   Occupation: disability  Tobacco Use   Smoking status: Never   Smokeless tobacco: Never  Vaping Use   Vaping Use: Never used  Substance and Sexual Activity   Alcohol use: Yes    Alcohol/week: 0.0 standard drinks of alcohol     Comment: 1-2 beers/month   Drug use: No   Sexual activity: Yes    Birth control/protection: None  Other Topics Concern   Not on file  Social History Narrative   The patient's son lives with her.  The patient recently lost an aunt in December 2020 and feels she got C19 related to the amount of family and friends coming in and out to pay respects to her aunt.    Social Determinants of Health   Financial Resource Strain: Low Risk  (09/24/2021)   Overall Financial Resource Strain (CARDIA)    Difficulty of Paying Living Expenses: Not hard at all  Food Insecurity: No Food Insecurity (09/24/2021)   Hunger Vital Sign    Worried About Running Out of Food in the Last Year: Never true    Ran Out of Food in the Last Year: Never true  Transportation Needs: No Transportation Needs (09/24/2021)   PRAPARE - Hydrologist (Medical): No    Lack of Transportation (Non-Medical): No  Physical Activity: Inactive (09/24/2021)   Exercise Vital Sign    Days of Exercise per Week: 0 days    Minutes of Exercise per Session: 0 min  Stress: No Stress Concern Present (09/24/2021)   Deaf Smith    Feeling of Stress : Not at all  Social Connections: Unknown (09/24/2021)   Social Connection and Isolation Panel [NHANES]    Frequency of Communication with Friends and Family: More than three times a week    Frequency of Social Gatherings with Friends and Family: Once a week    Attends Religious Services: Never    Marine scientist or Organizations: No    Attends Music therapist: Never    Marital Status: Not on file     Family History: The patient's family history includes Arthritis in her father; Asthma in her father; Diabetes in her brother, father, maternal grandfather, maternal grandmother, mother, and paternal grandmother; Heart attack in her maternal uncle; Hyperlipidemia in her mother; Hypertension  in her brother, father, maternal grandfather, maternal grandmother, mother, and paternal grandmother; Stroke in her mother. There is no history of Breast cancer.  ROS:   Please see the history of present illness.     All other systems reviewed and are negative.  EKGs/Labs/Other Studies Reviewed:    The following studies were reviewed today:  Echo 11/2018  1. Left ventricular ejection fraction, by visual estimation, is 55 to  60%. The left ventricle has normal function. Normal left ventricular size.  There is mildly increased left ventricular hypertrophy.   2. Definity contrast agent was given IV to delineate the left ventricular  endocardial  borders.   3. Global right ventricle has normal systolic function.The right  ventricular size is normal. No increase in right ventricular wall  thickness.   4. Left atrial size was mildly dilated.   5. Right atrial size was mildly dilated.   6. The mitral valve is normal in structure. No evidence of mitral valve  regurgitation. No evidence of mitral stenosis.   7. The tricuspid valve is normal in structure. Tricuspid valve  regurgitation was not visualized by color flow Doppler.   8. The aortic valve is normal in structure. Aortic valve regurgitation  was not visualized by color flow Doppler. Structurally normal aortic  valve, with no evidence of sclerosis or stenosis.   9. The pulmonic valve was normal in structure. Pulmonic valve  regurgitation is not visualized by color flow Doppler.  10. TR signal is inadequate for assessing pulmonary artery systolic  pressure.   EKG:  EKG is ordered today.  The ekg ordered today demonstrates NSR, 1st degree AV block, low voltage, nonspecific ST changes  Recent Labs: 12/25/2020: Magnesium 2.4; TSH 2.780 04/06/2021: ALT 15; BNP 18.1; Hemoglobin 12.3; Platelets 302 04/12/2021: BUN 15; Creatinine, Ser 0.92; Potassium 4.3; Sodium 142  Recent Lipid Panel    Component Value Date/Time   CHOL 119 08/24/2021 0815    CHOL 100 04/01/2019 0805   TRIG 70 08/24/2021 0815   TRIG 66 04/01/2019 0805   HDL 44 08/24/2021 0815   VLDL 13 04/01/2019 0805   LDLCALC 60 08/24/2021 0815      Physical Exam:    VS:  BP 128/70 (BP Location: Left Arm, Patient Position: Sitting, Cuff Size: Normal)   Pulse 76   Ht 5' 9"$  (1.753 m)   Wt (!) 365 lb (165.6 kg)   SpO2 93%   BMI 53.90 kg/m     Wt Readings from Last 3 Encounters:  09/26/21 (!) 365 lb (165.6 kg)  08/24/21 (!) 363 lb (164.7 kg)  04/09/21 (!) 363 lb 3.2 oz (164.7 kg)     GEN:  Well nourished, well developed in no acute distress HEENT: Normal NECK: No JVD; No carotid bruits LYMPHATICS: No lymphadenopathy CARDIAC: RRR, no murmurs, rubs, gallops RESPIRATORY:  Clear to auscultation without rales, wheezing or rhonchi  ABDOMEN: Soft, non-tender, non-distended MUSCULOSKELETAL:  No edema; No deformity  SKIN: Warm and dry NEUROLOGIC:  Alert and oriented x 3 PSYCHIATRIC:  Normal affect   ASSESSMENT:    1. Chronic heart failure with preserved ejection fraction ([place])   2. Essential hypertension   3. Hyperlipidemia, mixed   4. Morbid obesity ([place])    PLAN:    In order of problems listed above:  Chronic HFimpEF Patient is euvolemic on exam. Echo in 2020 showed LVEF 55-60%, mild LVH and mildly dilated atrium. Continue Lasix 13m BID, Losartan, and Jardiance, and atenolol.   HTN BP is good today. Continue Losartan 265mdaily, Atenolol 5047maily, and amlodipine 5mg70mily.  HLD LDL 60. Continue Lipitor 10mg12mly.   Morbid obesity Weight loss recommended.   Disposition: Follow up in 6 month(s) with MD/APP    Signed, Vashti Bolanos H FurNinfa MeekerC  09/26/2021 9:19 AM    [facility]

## 2021-10-18 ENCOUNTER — Telehealth: Payer: Self-pay

## 2021-10-18 NOTE — Progress Notes (Signed)
Chronic Care Management Pharmacy Assistant   Name: Sara Zhang  MRN: [id number] DOB: Sep 08, 1970  Reason for Encounter: Disease State   Conditions to be addressed/monitored: HTN   Recent office visits:  None since last coordination call on 09/10/21   Recent consult visits:  09/26/21 Antony Madura, PA-C Cardiology (CHF) Orders placed: EKG; Medication changes: none  Hospital visits:  None in previous 6 months  Medications: Outpatient Encounter Medications as of 10/18/2021  Medication Sig   amLODipine (NORVASC) 5 MG tablet TAKE 1 TABLET(5 MG) BY MOUTH DAILY   atenolol (TENORMIN) 50 MG tablet TAKE 1 TABLET(50 MG) BY MOUTH DAILY   atorvastatin (LIPITOR) 10 MG tablet TAKE 1 TABLET(10 MG) BY MOUTH DAILY   Blood Glucose Monitoring Suppl (CONTOUR NEXT MONITOR) w/Device KIT To check blood sugar 4 times daily.   Calcium Carbonate-Vitamin D (OYSTER SHELL CALCIUM 500 + D) 500-125 MG-UNIT TABS Take 1 tablet by mouth daily.    Cholecalciferol (VITAMIN D3) 10 MCG (400 UNIT) tablet Take 400 Units by mouth daily.   Dulaglutide (TRULICITY) 1.5 RD/4.0CX SOPN Inject 1.5 mg into the skin once a week.   furosemide (LASIX) 40 MG tablet TAKE 1 TABLET(40 MG) BY MOUTH TWICE DAILY   glucose blood (CONTOUR NEXT TEST) test strip TEST BLOOD SUGAR ONCE DAILY   Insulin Glargine (BASAGLAR KWIKPEN) 100 UNIT/ML Inject 52 Units into the skin at bedtime.   Insulin Pen Needle 32G X 4 MM MISC 1 Units by Does not apply route every morning. Pen needles   JARDIANCE 25 MG TABS tablet TAKE 1 TABLET(25 MG) BY MOUTH DAILY   Lancets (ONETOUCH ULTRASOFT) lancets Use as instructed   levonorgestrel (MIRENA) 20 MCG/24HR IUD 1 each by Intrauterine route once.    losartan (COZAAR) 25 MG tablet TAKE 1 TABLET(25 MG) BY MOUTH DAILY   OXYGEN Inhale 2 L into the lungs daily.   No facility-administered encounter medications on file as of 10/18/2021.    Recent Office Vitals: BP Readings from Last 3 Encounters:  09/26/21 128/70   08/24/21 132/74  04/09/21 125/77   Pulse Readings from Last 3 Encounters:  09/26/21 76  08/24/21 78  04/09/21 80    Wt Readings from Last 3 Encounters:  09/26/21 (!) 365 lb (165.6 kg)  08/24/21 (!) 363 lb (164.7 kg)  04/09/21 (!) 363 lb 3.2 oz (164.7 kg)     Kidney Function Lab Results  Component Value Date/Time   CREATININE 0.92 04/12/2021 08:37 AM   CREATININE 1.02 (H) 04/06/2021 11:54 AM   CREATININE 0.75 09/25/2013 02:00 PM   CREATININE 0.89 06/17/2013 11:51 AM   GFRNONAA 53 (L) 03/08/2020 09:29 AM   GFRNONAA >60 02/04/2020 11:51 PM   GFRNONAA >60 09/25/2013 02:00 PM   GFRAA 61 03/08/2020 09:29 AM   GFRAA >60 09/25/2013 02:00 PM       Latest Ref Rng & Units 04/12/2021    8:37 AM 04/06/2021   11:54 AM 03/27/2021    9:19 AM  BMP  Glucose 70 - 99 mg/dL 103  236  144   BUN 6 - 24 mg/dL 15  16  19    Creatinine 0.57 - 1.00 mg/dL 0.92  1.02  1.15   BUN/Creat Ratio 9 - 23 16  16  17    Sodium 134 - 144 mmol/L 142  142  146   Potassium 3.5 - 5.2 mmol/L 4.3  4.6  5.3   Chloride 96 - 106 mmol/L 100  100  103   CO2 20 -  29 mmol/L 28  26  26    Calcium 8.7 - 10.2 mg/dL 8.1  7.7  8.4    Reviewed chart prior to disease state call. 3 attempts to contact patient. Unable to reach patient, left messages for patient to return call.  Current antihypertensive regimen:  Atenolol 50 mg tabke 1 tab daily Amlodipine 5 mg take 1 tab daily Furosemide 40 mg take 1 tab twice daily   Adherence Review: Is the patient currently on ACE/ARB medication? Yes Does the patient have >5 day gap between last estimated fill dates? Yes   Care Gaps: Colonoscopy-03/26/20 (Fecal DNA (Cologuard)) Diabetic Foot Exam-12/25/20 Mammogram-01/26/20 Ophthalmology-12/07/20 Dexa Scan - NA Annual Well Visit - 09/24/21 (Medicare)  Micro albumin-08/14/21 Hemoglobin A1c- 08/14/21  Star Rating Drugs: Atorvastatin 10 mg Last filled:08/27/21 90 ds, 06/30/76 90 ds Trulicity 1.5 mg Last filled: 10/16/21 28 ds, 07/04/21  84 DS Jardiance 25 mg Last filled:09/18/21 30 ds, 08/14/21 30 DS Basaglar 100 unit Last filled:07/30/21 83 DS, 05/02/21 83 ds Losartan 25 mg Last filled:08/28/21 90 DS, 08/23/21 9 ds  [place] [phone]

## 2021-11-20 ENCOUNTER — Other Ambulatory Visit: Payer: Self-pay | Admitting: Nurse Practitioner

## 2021-11-20 NOTE — Telephone Encounter (Signed)
Requested medication (s) are due for refill today: yes  Requested medication (s) are on the active medication list: yes  Last refill:  08/23/21 #90  Future visit scheduled: yes  Notes to clinic:  overdue lab work   Requested Prescriptions  Pending Prescriptions Disp Refills   losartan (COZAAR) 25 MG tablet [Pharmacy Med Name: LOSARTAN 25MG  TABLETS] 90 tablet     Sig: TAKE 1 TABLET(25 MG) BY MOUTH DAILY     Cardiovascular:  Angiotensin Receptor Blockers Failed - 11/20/2021  6:26 AM      Failed - Cr in normal range and within 180 days    Creatinine  Date Value Ref Range Status  09/25/2013 0.75 0.60 - 1.30 mg/dL Final   Creatinine, Ser  Date Value Ref Range Status  04/12/2021 0.92 0.57 - 1.00 mg/dL Final         Failed - K in normal range and within 180 days    Potassium  Date Value Ref Range Status  04/12/2021 4.3 3.5 - 5.2 mmol/L Final  09/25/2013 4.2 3.5 - 5.1 mmol/L Final         Passed - Patient is not pregnant      Passed - Last BP in normal range    BP Readings from Last 1 Encounters:  09/26/21 128/70         Passed - Valid encounter within last 6 months    Recent Outpatient Visits           2 months ago Type 2 diabetes mellitus treated with insulin ([place])   [place], [place] T, NP   7 months ago Acute cough   [place], [place] T, NP   7 months ago Cough variant asthma vs Upper Airway cough syndrome   [facility] [place], Jolene T, NP   7 months ago Chronic heart failure with preserved ejection fraction ([place])   [place], Jolene T, NP   7 months ago Type 2 diabetes mellitus treated with insulin ([place])   [place], Barbaraann Faster, NP       Future Appointments             In 2 months Cannady, Barbaraann Faster, NP [employer], [place]   In 4 months End, Harrell Gave, [place]. [facility]            Signed  Prescriptions Disp Refills   atenolol (TENORMIN) 50 MG tablet 90 tablet 0    Sig: TAKE 1 TABLET(50 MG) BY MOUTH DAILY     Cardiovascular: Beta Blockers 2 Passed - 11/20/2021  6:26 AM      Passed - Cr in normal range and within 360 days    Creatinine  Date Value Ref Range Status  09/25/2013 0.75 0.60 - 1.30 mg/dL Final   Creatinine, Ser  Date Value Ref Range Status  04/12/2021 0.92 0.57 - 1.00 mg/dL Final         Passed - Last BP in normal range    BP Readings from Last 1 Encounters:  09/26/21 128/70         Passed - Last Heart Rate in normal range    Pulse Readings from Last 1 Encounters:  09/26/21 76         Passed - Valid encounter within last 6 months    Recent Outpatient Visits           2 months ago  Type 2 diabetes mellitus treated with insulin (HCC)   [facility] [place], [place] T, NP   7 months ago Acute cough   [facility] [place], [place] T, NP   7 months ago Cough variant asthma vs Upper Airway cough syndrome   [facility] [place], Jolene T, NP   7 months ago Chronic heart failure with preserved ejection fraction (HCC)   [facility] [place], Jolene T, NP   7 months ago Type 2 diabetes mellitus treated with insulin (HCC)   [facility] [place], Dorie Rank, NP       Future Appointments             In 2 months Cannady, Dorie Rank, NP [employer], [facility]   In 4 months End, Cristal Deer, MD [facility] [place]. [facility]

## 2021-11-20 NOTE — Telephone Encounter (Signed)
Requested Prescriptions  Pending Prescriptions Disp Refills  . atenolol (TENORMIN) 50 MG tablet [Pharmacy Med Name: ATENOLOL 50MG  TABLETS] 90 tablet 0    Sig: TAKE 1 TABLET(50 MG) BY MOUTH DAILY     Cardiovascular: Beta Blockers 2 Passed - 11/20/2021  6:26 AM      Passed - Cr in normal range and within 360 days    Creatinine  Date Value Ref Range Status  09/25/2013 0.75 0.60 - 1.30 mg/dL Final   Creatinine, Ser  Date Value Ref Range Status  04/12/2021 0.92 0.57 - 1.00 mg/dL Final         Passed - Last BP in normal range    BP Readings from Last 1 Encounters:  09/26/21 128/70         Passed - Last Heart Rate in normal range    Pulse Readings from Last 1 Encounters:  09/26/21 76         Passed - Valid encounter within last 6 months    Recent Outpatient Visits          2 months ago Type 2 diabetes mellitus treated with insulin ([place])   [place], [place] T, NP   7 months ago Acute cough   [place], [place] T, NP   7 months ago Cough variant asthma vs Upper Airway cough syndrome   [facility] [place], Jolene T, NP   7 months ago Chronic heart failure with preserved ejection fraction ([place])   [place], Jolene T, NP   7 months ago Type 2 diabetes mellitus treated with insulin ([place])   [place], Barbaraann Faster, NP      Future Appointments            In 2 months Cannady, Barbaraann Faster, NP [employer], [facility]   In 4 months End, Harrell Gave, [place]. [facility]           . losartan (COZAAR) 25 MG tablet [Pharmacy Med Name: LOSARTAN 25MG  TABLETS] 90 tablet     Sig: TAKE 1 TABLET(25 MG) BY MOUTH DAILY     Cardiovascular:  Angiotensin Receptor Blockers Failed - 11/20/2021  6:26 AM      Failed - Cr in normal range and within 180 days    Creatinine  Date Value Ref Range Status  09/25/2013 0.75 0.60 - 1.30 mg/dL Final    Creatinine, Ser  Date Value Ref Range Status  04/12/2021 0.92 0.57 - 1.00 mg/dL Final         Failed - K in normal range and within 180 days    Potassium  Date Value Ref Range Status  04/12/2021 4.3 3.5 - 5.2 mmol/L Final  09/25/2013 4.2 3.5 - 5.1 mmol/L Final         Passed - Patient is not pregnant      Passed - Last BP in normal range    BP Readings from Last 1 Encounters:  09/26/21 128/70         Passed - Valid encounter within last 6 months    Recent Outpatient Visits          2 months ago Type 2 diabetes mellitus treated with insulin ([place])   [place], [place] T, NP   7 months ago Acute cough   [place], [place] T, NP   7 months ago Cough variant asthma vs Upper Airway cough  syndrome   [facility] [place], [place] T, NP   7 months ago Chronic heart failure with preserved ejection fraction (HCC)   [facility] [place], Jolene T, NP   7 months ago Type 2 diabetes mellitus treated with insulin (HCC)   [facility] [place], Dorie Rank, NP      Future Appointments            In 2 months Cannady, Dorie Rank, NP [employer]   In 4 months End, Cristal Deer, MD [facility] [place]. [facility]

## 2021-12-11 ENCOUNTER — Other Ambulatory Visit: Payer: Self-pay

## 2021-12-11 DIAGNOSIS — H40003 Preglaucoma, unspecified, bilateral: Secondary | ICD-10-CM | POA: Diagnosis not present

## 2021-12-11 LAB — HM DIABETES EYE EXAM

## 2022-01-20 NOTE — Patient Instructions (Signed)
Diabetes Mellitus Basics  Diabetes mellitus, or diabetes, is a long-term (chronic) disease. It occurs when the body does not properly use sugar (glucose) that is released from food after you eat. Diabetes mellitus may be caused by one or both of these problems: Your pancreas does not make enough of a hormone called insulin. Your body does not react in a normal way to the insulin that it makes. Insulin lets glucose enter cells in your body. This gives you energy. If you have diabetes, glucose cannot get into cells. This causes high blood glucose (hyperglycemia). How to treat and manage diabetes You may need to take insulin or other diabetes medicines daily to keep your glucose in balance. If you are prescribed insulin, you will learn how to give yourself insulin by injection. You may need to adjust the amount of insulin you take based on the foods that you eat. You will need to check your blood glucose levels using a glucose monitor as told by your health care provider. The readings can help determine if you have low or high blood glucose. Generally, you should have these blood glucose levels: Before meals (preprandial): 80-130 mg/dL (4.4-7.2 mmol/L). After meals (postprandial): below 180 mg/dL (10 mmol/L). Hemoglobin A1c (HbA1c) level: less than 7%. Your health care provider will set treatment goals for you. Keep all follow-up visits. This is important. Follow these instructions at home: Diabetes medicines Take your diabetes medicines every day as told by your health care provider. List your diabetes medicines here: Name of medicine: ______________________________ Amount (dose): _______________ Time (a.m./p.m.): _______________ Notes: ___________________________________ Name of medicine: ______________________________ Amount (dose): _______________ Time (a.m./p.m.): _______________ Notes: ___________________________________ Name of medicine: ______________________________ Amount (dose):  _______________ Time (a.m./p.m.): _______________ Notes: ___________________________________ Insulin If you use insulin, list the types of insulin you use here: Insulin type: ______________________________ Amount (dose): _______________ Time (a.m./p.m.): _______________Notes: ___________________________________ Insulin type: ______________________________ Amount (dose): _______________ Time (a.m./p.m.): _______________ Notes: ___________________________________ Insulin type: ______________________________ Amount (dose): _______________ Time (a.m./p.m.): _______________ Notes: ___________________________________ Insulin type: ______________________________ Amount (dose): _______________ Time (a.m./p.m.): _______________ Notes: ___________________________________ Insulin type: ______________________________ Amount (dose): _______________ Time (a.m./p.m.): _______________ Notes: ___________________________________ Managing blood glucose  Check your blood glucose levels using a glucose monitor as told by your health care provider. Write down the times that you check your glucose levels here: Time: _______________ Notes: ___________________________________ Time: _______________ Notes: ___________________________________ Time: _______________ Notes: ___________________________________ Time: _______________ Notes: ___________________________________ Time: _______________ Notes: ___________________________________ Time: _______________ Notes: ___________________________________  Low blood glucose Low blood glucose (hypoglycemia) is when glucose is at or below 70 mg/dL (3.9 mmol/L). Symptoms may include: Feeling: Hungry. Sweaty and clammy. Irritable or easily upset. Dizzy. Sleepy. Having: A fast heartbeat. A headache. A change in your vision. Numbness around the mouth, lips, or tongue. Having trouble with: Moving (coordination). Sleeping. Treating low blood glucose To treat low blood  glucose, eat or drink something containing sugar right away. If you can think clearly and swallow safely, follow the 15:15 rule: Take 15 grams of a fast-acting carb (carbohydrate), as told by your health care provider. Some fast-acting carbs are: Glucose tablets: take 3-4 tablets. Hard candy: eat 3-5 pieces. Fruit juice: drink 4 oz (120 mL). Regular (not diet) soda: drink 4-6 oz (120-180 mL). Honey or sugar: eat 1 Tbsp (15 mL). Check your blood glucose levels 15 minutes after you take the carb. If your glucose is still at or below 70 mg/dL (3.9 mmol/L), take 15 grams of a carb again. If your glucose does not go above 70 mg/dL (3.9 mmol/L) after   3 tries, get help right away. After your glucose goes back to normal, eat a meal or a snack within 1 hour. Treating very low blood glucose If your glucose is at or below 54 mg/dL (3 mmol/L), you have very low blood glucose (severe hypoglycemia). This is an emergency. Do not wait to see if the symptoms will go away. Get medical help right away. Call your local emergency services (911 in the [place] Do not drive yourself to the hospital. Questions to ask your health care provider Should I talk with a diabetes educator? What equipment will I need to care for myself at home? What diabetes medicines do I need? When should I take them? How often do I need to check my blood glucose levels? What number can I call if I have questions? When is my follow-up visit? Where can I find a support group for people with diabetes? Where to find more information American Diabetes Association: www.diabetes.org Association of Diabetes Care and Education Specialists: www.diabeteseducator.org Contact a health care provider if: Your blood glucose is at or above 240 mg/dL (13.3 mmol/L) for 2 days in a row. You have been sick or have had a fever for 2 days or more, and you are not getting better. You have any of these problems for more than 6 hours: You cannot eat or  drink. You feel nauseous. You vomit. You have diarrhea. Get help right away if: Your blood glucose is lower than 54 mg/dL (3 mmol/L). You get confused. You have trouble thinking clearly. You have trouble breathing. These symptoms may represent a serious problem that is an emergency. Do not wait to see if the symptoms will go away. Get medical help right away. Call your local emergency services (911 in the [place] Do not drive yourself to the hospital. Summary Diabetes mellitus is a chronic disease that occurs when the body does not properly use sugar (glucose) that is released from food after you eat. Take insulin and diabetes medicines as told. Check your blood glucose every day, as often as told. Keep all follow-up visits. This is important. This information is not intended to replace advice given to you by your health care provider. Make sure you discuss any questions you have with your health care provider. Document Revised: 05/25/2019 Document Reviewed: 05/25/2019 Elsevier Patient Education  2023 [employer]vier Inc.  

## 2022-01-21 ENCOUNTER — Encounter: Payer: Self-pay | Admitting: Nurse Practitioner

## 2022-01-21 ENCOUNTER — Ambulatory Visit (INDEPENDENT_AMBULATORY_CARE_PROVIDER_SITE_OTHER): Payer: Medicare Other | Admitting: Nurse Practitioner

## 2022-01-21 VITALS — BP 133/81 | HR 67 | Temp 98.3°F | Ht 69.02 in | Wt 362.4 lb

## 2022-01-21 DIAGNOSIS — R809 Proteinuria, unspecified: Secondary | ICD-10-CM | POA: Diagnosis not present

## 2022-01-21 DIAGNOSIS — I5032 Chronic diastolic (congestive) heart failure: Secondary | ICD-10-CM

## 2022-01-21 DIAGNOSIS — R051 Acute cough: Secondary | ICD-10-CM

## 2022-01-21 DIAGNOSIS — G4733 Obstructive sleep apnea (adult) (pediatric): Secondary | ICD-10-CM

## 2022-01-21 DIAGNOSIS — N182 Chronic kidney disease, stage 2 (mild): Secondary | ICD-10-CM | POA: Diagnosis not present

## 2022-01-21 DIAGNOSIS — E538 Deficiency of other specified B group vitamins: Secondary | ICD-10-CM

## 2022-01-21 DIAGNOSIS — Z794 Long term (current) use of insulin: Secondary | ICD-10-CM

## 2022-01-21 DIAGNOSIS — E1169 Type 2 diabetes mellitus with other specified complication: Secondary | ICD-10-CM | POA: Diagnosis not present

## 2022-01-21 DIAGNOSIS — J45991 Cough variant asthma: Secondary | ICD-10-CM | POA: Diagnosis not present

## 2022-01-21 DIAGNOSIS — I428 Other cardiomyopathies: Secondary | ICD-10-CM | POA: Diagnosis not present

## 2022-01-21 DIAGNOSIS — I130 Hypertensive heart and chronic kidney disease with heart failure and stage 1 through stage 4 chronic kidney disease, or unspecified chronic kidney disease: Secondary | ICD-10-CM

## 2022-01-21 DIAGNOSIS — Z6841 Body Mass Index (BMI) 40.0 and over, adult: Secondary | ICD-10-CM

## 2022-01-21 DIAGNOSIS — N1831 Chronic kidney disease, stage 3a: Secondary | ICD-10-CM | POA: Diagnosis not present

## 2022-01-21 DIAGNOSIS — Z23 Encounter for immunization: Secondary | ICD-10-CM

## 2022-01-21 DIAGNOSIS — E559 Vitamin D deficiency, unspecified: Secondary | ICD-10-CM | POA: Diagnosis not present

## 2022-01-21 DIAGNOSIS — E119 Type 2 diabetes mellitus without complications: Secondary | ICD-10-CM | POA: Diagnosis not present

## 2022-01-21 DIAGNOSIS — E785 Hyperlipidemia, unspecified: Secondary | ICD-10-CM

## 2022-01-21 DIAGNOSIS — E1129 Type 2 diabetes mellitus with other diabetic kidney complication: Secondary | ICD-10-CM | POA: Diagnosis not present

## 2022-01-21 DIAGNOSIS — R052 Subacute cough: Secondary | ICD-10-CM | POA: Insufficient documentation

## 2022-01-21 DIAGNOSIS — Z124 Encounter for screening for malignant neoplasm of cervix: Secondary | ICD-10-CM

## 2022-01-21 LAB — BAYER DCA HB A1C WAIVED: HB A1C (BAYER DCA - WAIVED): 7.7 % — ABNORMAL HIGH (ref 4.8–5.6)

## 2022-01-21 MED ORDER — TRULICITY 3 MG/0.5ML ~~LOC~~ SOAJ: 3.0000 mg | SUBCUTANEOUS | 4 refills | Status: DC

## 2022-01-21 MED ORDER — AZITHROMYCIN 250 MG PO TABS: ORAL_TABLET | ORAL | 0 refills | Status: AC

## 2022-01-21 MED ORDER — HYDROCOD POLI-CHLORPHE POLI ER 10-8 MG/5ML PO SUER: 5.0000 mL | Freq: Every evening | ORAL | 0 refills | Status: DC | PRN

## 2022-01-21 MED ORDER — ALBUTEROL SULFATE HFA 108 (90 BASE) MCG/ACT IN AERS: 2.0000 | INHALATION_SPRAY | Freq: Four times a day (QID) | RESPIRATORY_TRACT | 2 refills | Status: DC | PRN

## 2022-01-21 NOTE — Assessment & Plan Note (Signed)
Chronic, ongoing.  Vitamin B12 level checked today and continue supplement daily.

## 2022-01-21 NOTE — Assessment & Plan Note (Signed)
Chronic, ongoing.  Continue current medication regimen as is at goal.  Lipid panel today.

## 2022-01-21 NOTE — Assessment & Plan Note (Signed)
Chronic, ongoing.  Continue current use of O2 at night. She has no concerns today.

## 2022-01-21 NOTE — Assessment & Plan Note (Signed)
Refer to morbid obesity plan of care. 

## 2022-01-21 NOTE — Assessment & Plan Note (Addendum)
Chronic, ongoing.  Followed by endo.  A1c today 7.7% and urine ALB  47 July 2023. Continue collaboration with endocrinology.  Continue current medication regimen as ordered by then, however will trial increase in Trulicity to 3 MG weekly -- discussed with patient.  Could consider switch to [facility] in future, Losartan offering kidney protection.  Recommend she continue to monitor BS at home 2-3 times a day.  Refills as needed.  Return in December.

## 2022-01-21 NOTE — Assessment & Plan Note (Signed)
BMI 53.49 with diabetes, HTN, HF.  Recommended eating smaller high protein, low fat meals more frequently and exercising 30 mins a day 5 times a week with a goal of 10-15lb weight loss in the next 3 months. Patient voiced their understanding and motivation to adhere to these recommendations.

## 2022-01-21 NOTE — Assessment & Plan Note (Signed)
Chronic, stable.  BP at goal today.  Continue current medication regimen and collaboration with cardiology.  Recommend checking BP three mornings a week at home + documenting for providers. Education and recommendation provided today on DASH diet focus and avoidance of high sodium foods, and cutting back on pork skins, which patient states is difficult to do.  DASH diet focus.  Return in 3 months.

## 2022-01-21 NOTE — Assessment & Plan Note (Signed)
Chronic, ongoing with current exacerbation.  Refer to acute cough POC.

## 2022-01-21 NOTE — Assessment & Plan Note (Signed)
Acute for 5 days with no improvement, concern for asthma exacerbation.  Start Azithromycin.  Sent in Tussionex and Albuterol inhaler to use as needed for symptoms relief.  Recommend: Your symptoms and exam findings are most consistent with a viral upper respiratory infection. These usually run their course in 5-7 days. Unfortunately, antibiotics don't work against viruses and just increase your risk of other issues such as diarrhea, yeast infections, and resistant infections.  If you start feeling worse with facial pain, high fever, cough, shortness of breath or start feeling significantly worse, please call us right away to be further evaluated.  Some things that can make you feel better are: - Increased rest - Increasing Fluids - Acetaminophen as needed for fever/pain.  - Salt water gargling, chloraseptic spray and throat lozenges - Mucinex.  - Humidifying the air.  Return if ongoing or worsening.

## 2022-01-21 NOTE — Assessment & Plan Note (Addendum)
Chronic, ongoing.  Followed by endo.  A1c today 7.7% and urine ALB  47 July 2023. Continue collaboration with endocrinology.  Continue current medication regimen as ordered by then, however will trial increase in Trulicity to 3 MG weekly -- discussed with patient.  Could consider switch to [facility] in future, Losartan offering kidney protection.  Recommend she continue to monitor BS at home 2-3 times a day.  Refills as needed.  Return in December. 

## 2022-01-21 NOTE — Assessment & Plan Note (Signed)
Chronic, ongoing.  Continue collaboration with cardiology and current medication regimen as prescribed by them.   

## 2022-01-21 NOTE — Progress Notes (Signed)
BP 133/81   Pulse 67   Temp 98.3 F (36.8 C) (Oral)   Ht 5' 9.02" (1.753 m)   Wt (!) 362 lb 6.4 oz (164.4 kg)   SpO2 96%   BMI 53.49 kg/m    Subjective:    Patient ID: Sara Zhang, female    DOB: [birth year], 51 y.o.   MRN: [id number]  HPI: Sara Zhang is a 51 y.o. female  Chief Complaint  Patient presents with   Diabetes   Hypertension   Hyperlipidemia   Asthma   HF   She refuses pap smear today for cervical cancer screening + refuses both flu and tetanus vaccinations.  DIABETES Current medications include Jardiance 25 MG, Basaglar 52 units QHS, and Trulicity 1.5 MG weekly.  A1c in July was 7.3% with endo.  Last saw endocrinology 08/14/21, with no changes made.  She has not returned since that visit, she reports she returns next year -- follows every 6 months.  She does endorse poor diet on occasion.  Continues on Vitamin D and B12 supplements daily. Polydipsia/polyuria: no Visual disturbance: no Chest pain: no Paresthesias: no Glucose Monitoring: yes             Accucheck frequency:              Fasting glucose: 90 to 120             Evening: <200 range             Before meals: Taking Insulin?: yes             Long acting insulin: 52 units             Short acting insulin: Blood Pressure Monitoring: not checking Retinal Examination: Up to Date -- [facility], Dr. Inez Pilgrim Foot Exam: Up to Date Pneumovax: Up to Date Influenza: Not Up to Date -- refuses Aspirin: no    HYPERTENSION / HYPERLIPIDEMIA/HF Current medications include Losartan (no Lisinopril due to cough), Furosemide, Amlodipine, Atenolol for HTN + Lipitor for HLD.    Followed by cardiology and last saw 09/26/21, to take Lasix BID.  Last echo in 2020 October was 55-60%. Satisfied with current treatment? yes Duration of hypertension: chronic BP monitoring frequency: not checking BP range:  BP medication side effects: no Duration of hyperlipidemia: chronic Cholesterol medication side  effects: no Cholesterol supplements: none Medication compliance: good compliance Aspirin: no Recent stressors: no Recurrent headaches: no Visual changes: no Palpitations: no Dyspnea: no Chest pain: no Lower extremity edema: no Dizzy/lightheaded: no  CHRONIC KIDNEY DISEASE Recent kidney function stable. CKD status: stable Medications renally dose: yes Previous renal evaluation: no Pneumovax:  Up to Date Influenza Vaccine:  Not up to Date   ASTHMA Had visit Dr. Sherene Sires with pulmonary last in 11/28/20 -- continues to use O2 every night.  No current inhaler regimen.  Currently she reports having cough for five days -- congestion, rhinorrhea, fatigue.  No fever, ear pain/pressure.  Has been around someone who had similar symptoms. Asthma status: exacerbated Satisfied with current treatment?: yes Albuterol/rescue inhaler frequency: none Dyspnea frequency: no Wheezing frequency: no Cough frequency: yes Nocturnal symptom frequency: no Limitation of activity: no Current upper respiratory symptoms: yes Triggers: enviromental Aerochamber/spacer use: no Visits to ER or Urgent Care in past year: no Pneumovax: Up to Date Influenza:  refuses    Relevant past medical, surgical, family and social history reviewed and updated as indicated. Interim medical history since our last visit reviewed. Allergies and  medications reviewed and updated.  Review of Systems  Constitutional:  Positive for fatigue. Negative for activity change, appetite change, chills, diaphoresis and fever.  HENT:  Positive for congestion, postnasal drip and rhinorrhea. Negative for ear discharge, ear pain, sinus pressure, sinus pain, sneezing, sore throat and voice change.   Respiratory:  Positive for cough. Negative for chest tightness, shortness of breath (baseline) and wheezing.   Cardiovascular:  Positive for leg swelling (at baseline, no worse). Negative for chest pain and palpitations.  Gastrointestinal: Negative.    Endocrine: Negative for polydipsia, polyphagia and polyuria.  Neurological: Negative.   Psychiatric/Behavioral: Negative.     Per HPI unless specifically indicated above     Objective:    BP 133/81   Pulse 67   Temp 98.3 F (36.8 C) (Oral)   Ht 5' 9.02" (1.753 m)   Wt (!) 362 lb 6.4 oz (164.4 kg)   SpO2 96%   BMI 53.49 kg/m   Wt Readings from Last 3 Encounters:  01/21/22 (!) 362 lb 6.4 oz (164.4 kg)  09/26/21 (!) 365 lb (165.6 kg)  08/24/21 (!) 363 lb (164.7 kg)    Physical Exam Vitals and nursing note reviewed.  Constitutional:      General: She is awake. She is not in acute distress.    Appearance: She is well-developed and well-groomed. She is morbidly obese. She is not ill-appearing.  HENT:     Head: Normocephalic.     Right Ear: Hearing normal.     Left Ear: Hearing normal.  Eyes:     General: Lids are normal. No scleral icterus.       Right eye: No discharge.        Left eye: No discharge.     Conjunctiva/sclera: Conjunctivae normal.     Pupils: Pupils are equal, round, and reactive to light.  Neck:     Thyroid: No thyromegaly.     Vascular: No carotid bruit.  Cardiovascular:     Rate and Rhythm: Normal rate and regular rhythm.     Pulses:          Dorsalis pedis pulses are 1+ on the right side and 1+ on the left side.     Heart sounds: Normal heart sounds. No murmur heard.    No gallop.  Pulmonary:     Effort: Pulmonary effort is normal. No accessory muscle usage or respiratory distress.     Breath sounds: Wheezing present. No decreased breath sounds or rhonchi.     Comments: Expiratory wheezes noted throughout.  Abdominal:     General: Abdomen is protuberant. Bowel sounds are normal.     Palpations: Abdomen is soft.  Musculoskeletal:     Cervical back: Normal range of motion and neck supple.     Right lower leg: 1+ Edema present.     Left lower leg: 1+ Edema present.  Skin:    General: Skin is warm and dry.     Capillary Refill: Capillary refill  takes less than 2 seconds.  Neurological:     Mental Status: She is alert and oriented to person, place, and time.  Psychiatric:        Attention and Perception: Attention normal.        Mood and Affect: Mood and affect normal.        Speech: Speech normal.        Behavior: Behavior normal. Behavior is cooperative.        Thought Content: Thought content normal.  Diabetic Foot Exam - Simple   Simple Foot Form Visual Inspection No deformities, no ulcerations, no other skin breakdown bilaterally: Yes Sensation Testing Intact to touch and monofilament testing bilaterally: Yes Pulse Check Posterior Tibialis and Dorsalis pulse intact bilaterally: Yes Comments     Results for orders placed or performed in visit on 08/24/21  Lipid Panel w/o Chol/HDL Ratio  Result Value Ref Range   Cholesterol, Total 119 100 - 199 mg/dL   Triglycerides 70 0 - 149 mg/dL   HDL 44 >47 mg/dL   VLDL Cholesterol Cal 15 5 - 40 mg/dL   LDL Chol Calc (NIH) 60 0 - 99 mg/dL      Assessment & Plan:   Problem List Items Addressed This Visit       Cardiovascular and Mediastinum   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  BP at goal today.  Continue current medication regimen and collaboration with cardiology.  Recommend checking BP three mornings a week at home + documenting for providers. Education and recommendation provided today on DASH diet focus and avoidance of high sodium foods, and cutting back on pork skins, which patient states is difficult to do.  DASH diet focus.  Return in 3 months.      Relevant Orders   Comprehensive metabolic panel   CBC with Differential/Platelet   TSH   Chronic heart failure with preserved ejection fraction (HCC)    Chronic, ongoing with BP at goal.  Euvolemic today.  Continue current medication regimen and collaboration with cardiology.  Recommend continued focus on diet and salt reduction.   Recommend: - Reminded to call for an overnight weight gain of >2 pounds or a weekly weight weight of >5 pounds - not adding salt to his food and has been reading food labels. Reviewed the importance of keeping daily sodium intake to 2000mg  daily -- NO PORK SKINS - Avoid Ibuprofen      Relevant Orders   Comprehensive metabolic panel   NICM (nonischemic cardiomyopathy) (HCC)    Chronic, ongoing.  Continue collaboration with cardiology and current medication regimen as prescribed by them.      Relevant Orders   Comprehensive metabolic panel     Respiratory   Cough variant asthma vs Upper Airway cough syndrome    Chronic, ongoing with current exacerbation.  Refer to acute cough POC.      Relevant Medications   albuterol (VENTOLIN HFA) 108 (90 Base) MCG/ACT inhaler   OSA on CPAP    Chronic, ongoing.  Continue current use of O2 at night. She has no concerns today.        Endocrine   Hyperlipidemia associated with type 2 diabetes mellitus (HCC)    Chronic, ongoing.  Continue current medication regimen as is at goal.  Lipid panel today.      Relevant Medications   Dulaglutide (TRULICITY) 3 MG/0.5ML SOPN   Other Relevant Orders   Bayer DCA Hb A1c Waived   Comprehensive metabolic panel   Lipid Panel w/o Chol/HDL Ratio   Proteinuria due to type 2 diabetes mellitus (HCC)    Chronic, ongoing.  Followed by endo.  A1c today 7.7% and urine ALB  47 July 2023. Continue collaboration with endocrinology.  Continue current medication regimen as ordered by then, however will trial increase in Trulicity to 3 MG weekly -- discussed with patient.  Could consider switch to [facility] in future, Losartan offering kidney protection.  Recommend  she continue to monitor BS at home 2-3 times a day.  Refills as needed.  Return in December.      Relevant Medications   Dulaglutide (TRULICITY) 3 MG/0.5ML SOPN   Other Relevant Orders   Bayer DCA Hb A1c Waived   Comprehensive metabolic panel   Type 2  diabetes mellitus treated with insulin (HCC) - Primary    Chronic, ongoing.  Followed by endo.  A1c today 7.7% and urine ALB  47 July 2023. Continue collaboration with endocrinology.  Continue current medication regimen as ordered by then, however will trial increase in Trulicity to 3 MG weekly -- discussed with patient.  Could consider switch to [facility] in future, Losartan offering kidney protection.  Recommend she continue to monitor BS at home 2-3 times a day.  Refills as needed.  Return in December.      Relevant Medications   Dulaglutide (TRULICITY) 3 MG/0.5ML SOPN   Other Relevant Orders   Bayer DCA Hb A1c Waived   Comprehensive metabolic panel     Genitourinary   CKD (chronic kidney disease) stage 3, GFR 30-59 ml/min (HCC)    Chronic and stable with labs improved on recent checks.  Continue to monitor closely.      Relevant Orders   Comprehensive metabolic panel     Other   Acute cough    Acute for 5 days with no improvement, concern for asthma exacerbation.  Start Azithromycin.  Sent in Tussionex and Albuterol inhaler to use as needed for symptoms relief.  Recommend: Your symptoms and exam findings are most consistent with a viral upper respiratory infection. These usually run their course in 5-7 days. Unfortunately, antibiotics don't work against viruses and just increase your risk of other issues such as diarrhea, yeast infections, and resistant infections.  If you start feeling worse with facial pain, high fever, cough, shortness of breath or start feeling significantly worse, please call us right away to be further evaluated.  Some things that can make you feel better are: - Increased rest - Increasing Fluids - Acetaminophen as needed for fever/pain.  - Salt water gargling, chloraseptic spray and throat lozenges - Mucinex.  - Humidifying the air.  Return if ongoing or worsening.      B12 deficiency    Chronic, ongoing.  Vitamin B12 level checked today and continue  supplement daily.      Relevant Orders   CBC with Differential/Platelet   Vitamin B12   BMI 50.0-59.9, adult (HCC)    Refer to morbid obesity plan of care.      Relevant Medications   Dulaglutide (TRULICITY) 3 MG/0.5ML SOPN   Morbid obesity (HCC)    BMI 53.49 with diabetes, HTN, HF.  Recommended eating smaller high protein, low fat meals more frequently and exercising 30 mins a day 5 times a week with a goal of 10-15lb weight loss in the next 3 months. Patient voiced their understanding and motivation to adhere to these recommendations.       Relevant Medications   Dulaglutide (TRULICITY) 3 MG/0.5ML SOPN   Vitamin D deficiency    Chronic, ongoing.  Continue supplement and recheck Vit D in office today.      Relevant Orders   VITAMIN D 25 Hydroxy (Vit-D Deficiency, Fractures)     Follow up plan: Return in about 3 months (around 04/22/2022) for T2DM, HTN/HLD, ASTHMA, OSA.

## 2022-01-21 NOTE — Assessment & Plan Note (Signed)
Chronic, ongoing with BP at goal.  Euvolemic today.  Continue current medication regimen and collaboration with cardiology.  Recommend continued focus on diet and salt reduction.  Recommend: - Reminded to call for an overnight weight gain of >2 pounds or a weekly weight weight of >5 pounds - not adding salt to his food and has been reading food labels. Reviewed the importance of keeping daily sodium intake to 2000mg  daily -- NO PORK SKINS - Avoid Ibuprofen

## 2022-01-21 NOTE — Assessment & Plan Note (Signed)
Chronic, ongoing.  Continue supplement and recheck Vit D in office today.

## 2022-01-21 NOTE — Assessment & Plan Note (Addendum)
Chronic and stable with labs improved on recent checks.  Continue to monitor closely.

## 2022-01-22 LAB — COMPREHENSIVE METABOLIC PANEL
ALT: 7 IU/L (ref 0–32)
AST: 11 IU/L (ref 0–40)
Albumin/Globulin Ratio: 1.2 (ref 1.2–2.2)
Albumin: 3.6 g/dL — ABNORMAL LOW (ref 3.8–4.9)
Alkaline Phosphatase: 113 IU/L (ref 44–121)
BUN/Creatinine Ratio: 17 (ref 9–23)
BUN: 20 mg/dL (ref 6–24)
Bilirubin Total: 0.3 mg/dL (ref 0.0–1.2)
CO2: 24 mmol/L (ref 20–29)
Calcium: 7.9 mg/dL — ABNORMAL LOW (ref 8.7–10.2)
Chloride: 101 mmol/L (ref 96–106)
Creatinine, Ser: 1.19 mg/dL — ABNORMAL HIGH (ref 0.57–1.00)
Globulin, Total: 3.1 g/dL (ref 1.5–4.5)
Glucose: 172 mg/dL — ABNORMAL HIGH (ref 70–99)
Potassium: 4.6 mmol/L (ref 3.5–5.2)
Sodium: 140 mmol/L (ref 134–144)
Total Protein: 6.7 g/dL (ref 6.0–8.5)
eGFR: 55 mL/min/{1.73_m2} — ABNORMAL LOW (ref >59)

## 2022-01-22 LAB — LIPID PANEL W/O CHOL/HDL RATIO
Cholesterol, Total: 121 mg/dL (ref 100–199)
HDL: 46 mg/dL (ref >39)
LDL Chol Calc (NIH): 62 mg/dL (ref 0–99)
Triglycerides: 63 mg/dL (ref 0–149)
VLDL Cholesterol Cal: 13 mg/dL (ref 5–40)

## 2022-01-22 LAB — CBC WITH DIFFERENTIAL/PLATELET
Basophils Absolute: 0 10*3/uL (ref 0.0–0.2)
Basos: 1 %
EOS (ABSOLUTE): 0.4 10*3/uL (ref 0.0–0.4)
Eos: 5 %
Hematocrit: 38.9 % (ref 34.0–46.6)
Hemoglobin: 12.1 g/dL (ref 11.1–15.9)
Immature Grans (Abs): 0 10*3/uL (ref 0.0–0.1)
Immature Granulocytes: 1 %
Lymphocytes Absolute: 2.2 10*3/uL (ref 0.7–3.1)
Lymphs: 27 %
MCH: 26 pg — ABNORMAL LOW (ref 26.6–33.0)
MCHC: 31.1 g/dL — ABNORMAL LOW (ref 31.5–35.7)
MCV: 84 fL (ref 79–97)
Monocytes Absolute: 0.8 10*3/uL (ref 0.1–0.9)
Monocytes: 9 %
Neutrophils Absolute: 4.7 10*3/uL (ref 1.4–7.0)
Neutrophils: 57 %
Platelets: 325 10*3/uL (ref 150–450)
RBC: 4.66 x10E6/uL (ref 3.77–5.28)
RDW: 13.7 % (ref 11.7–15.4)
WBC: 8.1 10*3/uL (ref 3.4–10.8)

## 2022-01-22 LAB — TSH: TSH: 4.01 u[IU]/mL (ref 0.450–4.500)

## 2022-01-22 LAB — VITAMIN D 25 HYDROXY (VIT D DEFICIENCY, FRACTURES): Vit D, 25-Hydroxy: 18 ng/mL — ABNORMAL LOW (ref 30.0–100.0)

## 2022-01-22 LAB — VITAMIN B12: Vitamin B-12: 495 pg/mL (ref 232–1245)

## 2022-01-22 NOTE — Progress Notes (Signed)
Contacted via MyChart Good morning Sara Zhang, your labs have returned: - Kidney function continues to show mild chronic kidney disease stage 3a, this waxes ad wanes for you I notice -- ensure good hydration and reduced salt.   - Calcium level remains low, we will monitor this and check some extra labs next visit.  Try to get some good calcium in diet daily. - CBC stable with no infection or anemia. - Cholesterol levels stable.  Thyroid level stable.  B12 level is on low side normal, please ensure you take 1000 MCG daily. -Vitamin D level low, please ensure to take 2000 units daily of Vitamin D3.  Any questions? Keep being amazing!!  Thank you for allowing me to participate in your care.  I appreciate you. Kindest regards, Sallie Maker

## 2022-02-11 ENCOUNTER — Ambulatory Visit: Payer: Self-pay

## 2022-02-11 NOTE — Telephone Encounter (Signed)
Patient given appt tomorrow at 1pm with Kathrine Haddock

## 2022-02-11 NOTE — Telephone Encounter (Signed)
  Chief Complaint: wheezing  Symptoms: SOB at times, wheezing, coughing with congestion that feels in lungs.  Frequency: ongoing since 1 week before Christmas Pertinent Negatives: Patient denies fever Disposition: [] ED /[] Urgent Care (no appt availability in office) / [] Appointment(In office/virtual)/ []  [facility]/ [] Home Care/ [x] Refused Recommended Disposition /[] [facility]/ [x]  Follow-up with PCP Additional Notes: pt states she was seen earlier for OV and was given inhaler and Zpack and cough syrup and still feeling no improvement. She says that her breathing is sometimes worse than normal and wanting to have CXR done to see if she has PNA. No appts available until 02/14/22. Advised pt of that and she asked if Jolene, NP can just put in order for CXR and she go have it done without appt. Advised I would send message back and have nurse FU but wouldn't be until possibly tomorrow. Pt verbalized understanding.   Reason for Disposition  [1] MILD difficulty breathing (e.g., minimal/no SOB at rest, SOB with walking, pulse <100) AND [2] NEW-onset or WORSE than normal  Answer Assessment - Initial Assessment Questions 1. RESPIRATORY STATUS: "Describe your breathing?" (e.g., wheezing, shortness of breath, unable to speak, severe coughing)      SOB at times and wheezing 2. ONSET: "When did this breathing problem begin?"      1 week before Christmas  3. PATTERN "Does the difficult breathing come and go, or has it been constant since it started?"      Comes and goes  4. SEVERITY: "How bad is your breathing?" (e.g., mild, moderate, severe)    - MILD: No SOB at rest, mild SOB with walking, speaks normally in sentences, can lie down, no retractions, pulse < 100.    - MODERATE: SOB at rest, SOB with minimal exertion and prefers to sit, cannot lie down flat, speaks in phrases, mild retractions, audible wheezing, pulse 100-120.    - SEVERE: Very SOB at rest, speaks in single words,  struggling to breathe, sitting hunched forward, retractions, pulse > 120      When coughing and moving around 6. CARDIAC HISTORY: "Do you have any history of heart disease?" (e.g., heart attack, angina, bypass surgery, angioplasty)      CHF 9. OTHER SYMPTOMS: "Do you have any other symptoms? (e.g., dizziness, runny nose, cough, chest pain, fever)     Cough and congestion and feels like in lungs  Protocols used: Breathing Difficulty-A-AH

## 2022-02-12 ENCOUNTER — Ambulatory Visit
Admission: RE | Admit: 2022-02-12 | Discharge: 2022-02-12 | Disposition: A | Discharge status: Home / Self Care | Payer: Medicare Other | Source: Home / Self Care | Attending: Nurse Practitioner | Admitting: Nurse Practitioner

## 2022-02-12 ENCOUNTER — Ambulatory Visit
Admission: RE | Admit: 2022-02-12 | Discharge: 2022-02-12 | Disposition: A | Discharge status: Home / Self Care | Payer: Medicare Other | Source: Ambulatory Visit | Attending: Nurse Practitioner | Admitting: Nurse Practitioner

## 2022-02-12 ENCOUNTER — Telehealth (INDEPENDENT_AMBULATORY_CARE_PROVIDER_SITE_OTHER): Payer: Medicare Other | Admitting: Nurse Practitioner

## 2022-02-12 ENCOUNTER — Encounter: Payer: Self-pay | Admitting: Nurse Practitioner

## 2022-02-12 DIAGNOSIS — R051 Acute cough: Secondary | ICD-10-CM

## 2022-02-12 DIAGNOSIS — R059 Cough, unspecified: Secondary | ICD-10-CM | POA: Diagnosis not present

## 2022-02-12 MED ORDER — AMOXICILLIN-POT CLAVULANATE 875-125 MG PO TABS: 1.0000 | ORAL_TABLET | Freq: Two times a day (BID) | ORAL | 0 refills | Status: DC

## 2022-02-12 MED ORDER — HYDROCOD POLI-CHLORPHE POLI ER 10-8 MG/5ML PO SUER: 5.0000 mL | Freq: Every evening | ORAL | 0 refills | Status: DC | PRN

## 2022-02-12 NOTE — Patient Instructions (Signed)

## 2022-02-12 NOTE — Progress Notes (Signed)
There were no vitals taken for this visit.   Subjective:    Patient ID: Sara Zhang, female    DOB: 02-Dec-1970, 52 y.o.   MRN: [id number]  HPI: Sara Zhang is a 52 y.o. female  Chief Complaint  Patient presents with   Shortness of Breath   Cough    Since week before Christmas   This visit was completed via video visit through MyChart due to the restrictions of the COVID-19 pandemic. All issues as above were discussed and addressed. Physical exam was done as above through visual confirmation on video through MyChart. If it was felt that the patient should be evaluated in the office, they were directed there. The patient verbally consented to this visit. Location of the patient: home Location of the provider: work Those involved with this call:  Provider: Aura Dials, DNP CMA: Tristan Schroeder, CMA Front Desk/Registration: [employer]  Time spent on call:  21 minutes with patient face to face via video conference. More than 50% of this time was spent in counseling and coordination of care. 15 minutes total spent in review of patient's record and preparation of their chart.  I verified patient identity using two factors (patient name and date of birth). Patient consents verbally to being seen via telemedicine visit today.    COUGH Has had a cough for 2 weeks.  Was treated on 01/21/22 with Azithromycin. She reports the cough is not as bad as it was, but is still present.  Denies any increased swelling legs or abdomen.  Currently taking 40 MG Lasix BID.  Has underlying HF, COPD, T2DM. Duration: weeks Circumstances of initial development of cough: URI Cough severity: moderate Cough description: productive Aggravating factors:  worse at night Alleviating factors: nothing Status:  fluctuating Treatments attempted: mucinex, cough syrup, and antibiotics Wheezing: yes Shortness of breath: yes Chest pain: no Chest tightness:no Nasal congestion: yes Runny nose: yes Postnasal  drip: yes Frequent throat clearing or swallowing: yes Hemoptysis: no Fevers: no Night sweats: no Weight loss: no Heartburn: no Recent foreign travel: no Tuberculosis contacts: no   Relevant past medical, surgical, family and social history reviewed and updated as indicated. Interim medical history since our last visit reviewed. Allergies and medications reviewed and updated.  Review of Systems  Constitutional:  Positive for fatigue. Negative for activity change, appetite change, chills and fever.  HENT:  Positive for congestion, postnasal drip and rhinorrhea. Negative for ear discharge, ear pain, facial swelling, sinus pressure, sinus pain, sneezing, sore throat and voice change.   Eyes:  Negative for pain and visual disturbance.  Respiratory:  Positive for cough, chest tightness, shortness of breath and wheezing.   Cardiovascular:  Negative for chest pain, palpitations and leg swelling.  Gastrointestinal: Negative.   Endocrine: Negative.   Neurological:  Negative for dizziness, numbness and headaches.  Psychiatric/Behavioral: Negative.      Per HPI unless specifically indicated above     Objective:    There were no vitals taken for this visit.  Wt Readings from Last 3 Encounters:  01/21/22 (!) 362 lb 6.4 oz (164.4 kg)  09/26/21 (!) 365 lb (165.6 kg)  08/24/21 (!) 363 lb (164.7 kg)    Physical Exam Vitals and nursing note reviewed.  Constitutional:      General: She is awake. She is not in acute distress.    Appearance: She is well-developed and well-groomed. She is obese. She is not ill-appearing or toxic-appearing.  HENT:     Head: Normocephalic.  Right Ear: Hearing normal.     Left Ear: Hearing normal.  Eyes:     General: Lids are normal.        Right eye: No discharge.        Left eye: No discharge.     Conjunctiva/sclera: Conjunctivae normal.  Pulmonary:     Effort: Pulmonary effort is normal. No accessory muscle usage or respiratory distress.   Musculoskeletal:     Cervical back: Normal range of motion.  Neurological:     Mental Status: She is alert and oriented to person, place, and time.  Psychiatric:        Attention and Perception: Attention normal.        Mood and Affect: Mood normal.        Behavior: Behavior normal. Behavior is cooperative.        Thought Content: Thought content normal.        Judgment: Judgment normal.     Results for orders placed or performed in visit on 01/21/22  HM DIABETES EYE EXAM  Result Value Ref Range   HM Diabetic Eye Exam No Retinopathy No Retinopathy      Assessment & Plan:   Problem List Items Addressed This Visit       Other   Acute cough - Primary    Ongoing with minimal improvement since 01/21/22 and Azithromycin.  Will obtain chest imaging to further assess.  Start Augmentin BID for 7 days.  Concern for PNA vs acute HF -- will determine next steps after imaging returns.  Continue inhaler regimen at home and sent in refill on cough medication.  Recommend: - Increased rest - Increasing Fluids - Acetaminophen as needed for fever/pain.  - Salt water gargling, chloraseptic spray and throat lozenges - Mucinex.  - Humidifying the air.       Relevant Orders   DG Chest 2 View    I discussed the assessment and treatment plan with the patient. The patient was provided an opportunity to ask questions and all were answered. The patient agreed with the plan and demonstrated an understanding of the instructions.   The patient was advised to call back or seek an in-person evaluation if the symptoms worsen or if the condition fails to improve as anticipated.   I provided 21+ minutes of time during this encounter.    Follow up plan: Return in about 1 week (around 02/19/2022) for Cough.

## 2022-02-12 NOTE — Assessment & Plan Note (Signed)
Ongoing with minimal improvement since 01/21/22 and Azithromycin.  Will obtain chest imaging to further assess.  Start Augmentin BID for 7 days.  Concern for PNA vs acute HF -- will determine next steps after imaging returns.  Continue inhaler regimen at home and sent in refill on cough medication.  Recommend: - Increased rest - Increasing Fluids - Acetaminophen as needed for fever/pain.  - Salt water gargling, chloraseptic spray and throat lozenges - Mucinex.  - Humidifying the air.

## 2022-02-13 NOTE — Progress Notes (Signed)
Contacted via MyChart   Good morning Sara Zhang, no pneumonia noted on imaging!!!  Woohoo!!  Also no fluid build up.  Continue with current plan and we will recheck your lungs next week.  Any questions? Keep being amazing!!  Thank you for allowing me to participate in your care.  I appreciate you. Kindest regards, Jabori Henegar

## 2022-02-17 NOTE — Patient Instructions (Signed)

## 2022-02-18 ENCOUNTER — Other Ambulatory Visit: Payer: Self-pay | Admitting: Nurse Practitioner

## 2022-02-18 ENCOUNTER — Other Ambulatory Visit: Payer: Self-pay

## 2022-02-18 MED ORDER — AMLODIPINE BESYLATE 5 MG PO TABS: ORAL_TABLET | ORAL | 0 refills | Status: DC

## 2022-02-18 NOTE — Telephone Encounter (Signed)
Requested Prescriptions  Pending Prescriptions Disp Refills   atorvastatin (LIPITOR) 10 MG tablet [Pharmacy Med Name: ATORVASTATIN 10MG  TABLETS] 90 tablet 3    Sig: TAKE 1 TABLET(10 MG) BY MOUTH DAILY     Cardiovascular:  Antilipid - Statins Failed - 02/18/2022  6:32 AM      Failed - Lipid Panel in normal range within the last 12 months    Cholesterol, Total  Date Value Ref Range Status  01/21/2022 121 100 - 199 mg/dL Final   Cholesterol Piccolo, Waived  Date Value Ref Range Status  04/01/2019 100 <200 mg/dL Final    Comment:                            Desirable                <200                         Borderline High      200- 239                         High                     >239    LDL Chol Calc (NIH)  Date Value Ref Range Status  01/21/2022 62 0 - 99 mg/dL Final   HDL  Date Value Ref Range Status  01/21/2022 46 >39 mg/dL Final   Triglycerides  Date Value Ref Range Status  01/21/2022 63 0 - 149 mg/dL Final   Triglycerides Piccolo,Waived  Date Value Ref Range Status  04/01/2019 66 <150 mg/dL Final    Comment:                            Normal                   <150                         Borderline High     150 - 199                         High                200 - 499                         Very High                >499          Passed - Patient is not pregnant      Passed - Valid encounter within last 12 months    Recent Outpatient Visits           6 days ago Acute cough   [place], Jolene T, NP   4 weeks ago Type 2 diabetes mellitus treated with insulin ([place])   [place], Jolene T, NP   5 months ago Type 2 diabetes mellitus treated with insulin ([place])   [place], [place] T, NP   10 months ago Acute cough   [place], [place] T, NP   10 months ago Cough variant asthma vs Upper Airway cough syndrome  [place], Barbaraann Faster, NP        Future Appointments             Tomorrow Venita Lick, NP [place], [place]   In 1 month [place], Harrell Gave, MD [place]. [place]   In 2 months [place], Barbaraann Faster, NP [facility]             atenolol (TENORMIN) 50 MG tablet [Pharmacy Med Name: ATENOLOL 50MG  TABLETS] 90 tablet 0    Sig: TAKE 1 TABLET(50 MG) BY MOUTH DAILY     Cardiovascular: Beta Blockers 2 Failed - 02/18/2022  6:32 AM      Failed - Cr in normal range and within 360 days    Creatinine  Date Value Ref Range Status  09/25/2013 0.75 0.60 - 1.30 mg/dL Final   Creatinine, Ser  Date Value Ref Range Status  01/21/2022 1.19 (H) 0.57 - 1.00 mg/dL Final         Passed - Last BP in normal range    BP Readings from Last 1 Encounters:  01/21/22 133/81         Passed - Last Heart Rate in normal range    Pulse Readings from Last 1 Encounters:  01/21/22 67         Passed - Valid encounter within last 6 months    Recent Outpatient Visits           6 days ago Acute cough   [place], Jolene T, NP   4 weeks ago Type 2 diabetes mellitus treated with insulin ([place])   [place], Jolene T, NP   5 months ago Type 2 diabetes mellitus treated with insulin ([place])   [place], [place] T, NP   10 months ago Acute cough   [place], Jolene T, NP   10 months ago Cough variant asthma vs Upper Airway cough syndrome   [place], Barbaraann Faster, NP       Future Appointments             Tomorrow [place], Barbaraann Faster, NP [place], [place]   In 1 month [place], Harrell Gave, [place]. [place]   In 2 months Cannady, Barbaraann Faster, NP [facility]

## 2022-02-19 ENCOUNTER — Ambulatory Visit (INDEPENDENT_AMBULATORY_CARE_PROVIDER_SITE_OTHER): Payer: Medicare Other | Admitting: Nurse Practitioner

## 2022-02-19 ENCOUNTER — Encounter: Payer: Self-pay | Admitting: Nurse Practitioner

## 2022-02-19 ENCOUNTER — Other Ambulatory Visit: Payer: Self-pay | Admitting: Internal Medicine

## 2022-02-19 VITALS — BP 117/77 | HR 65 | Temp 98.9°F | Ht 69.02 in | Wt 367.2 lb

## 2022-02-19 DIAGNOSIS — J45991 Cough variant asthma: Secondary | ICD-10-CM | POA: Diagnosis not present

## 2022-02-19 DIAGNOSIS — R052 Subacute cough: Secondary | ICD-10-CM | POA: Diagnosis not present

## 2022-02-19 MED ORDER — AMOXICILLIN-POT CLAVULANATE 875-125 MG PO TABS: 1.0000 | ORAL_TABLET | Freq: Two times a day (BID) | ORAL | 0 refills | Status: AC

## 2022-02-19 NOTE — Assessment & Plan Note (Signed)
Chronic, improvement reported by pt. Refer to acute cough POC.

## 2022-02-19 NOTE — Progress Notes (Signed)
.  BP 117/77   Pulse 65   Temp 98.9 F (37.2 C) (Oral)   Ht 5' 9.02" (1.753 m)   Wt (!) 367 lb 3.2 oz (166.6 kg)   SpO2 93%   BMI 54.20 kg/m    Subjective:    Patient ID: Sara Zhang, female    DOB: September 25, 1970, 52 y.o.   MRN: [id number]  HPI: Sara Zhang is a 52 y.o. female  Chief Complaint  Patient presents with   Cough    Since before Christmas,and was seen last week for cough as well, has chest x ray last week as well.   NOTE WRITTEN BY DNP STUDENT.  ASSESSMENT AND PLAN OF CARE REVIEWED WITH STUDENT, AGREE WITH ABOVE FINDINGS AND PLAN.   COUGH Duration: weeks Circumstances of initial development of cough: unknown Cough severity: moderate Cough description: productive Aggravating factors:  worse at night, when lying down Alleviating factors: cough syrup and antibiotics Status:  better Treatments attempted: antibiotics, inhalers Wheezing: yes Shortness of breath: yes Chest pain: no Chest tightness:no Nasal congestion: no Runny nose: no Postnasal drip: yes Frequent throat clearing or swallowing: yes Hemoptysis: no Fevers: no Night sweats: no Weight loss: no Heartburn: no Recent foreign travel: no Tuberculosis contacts: no   Relevant past medical, surgical, family and social history reviewed and updated as indicated. Interim medical history since our last visit reviewed. Allergies and medications reviewed and updated.  Review of Systems  Constitutional:  Positive for fatigue. Negative for appetite change, chills, diaphoresis, fever and unexpected weight change.  HENT:  Positive for postnasal drip. Negative for congestion, ear pain, rhinorrhea, sinus pressure, sinus pain, sneezing and sore throat.   Respiratory:  Positive for cough. Negative for choking, chest tightness, shortness of breath, wheezing and stridor.   Cardiovascular:  Negative for chest pain and palpitations.   Per HPI unless specifically indicated above     Objective:    BP 117/77    Pulse 65   Temp 98.9 F (37.2 C) (Oral)   Ht 5' 9.02" (1.753 m)   Wt (!) 367 lb 3.2 oz (166.6 kg)   SpO2 93%   BMI 54.20 kg/m   Wt Readings from Last 3 Encounters:  02/19/22 (!) 367 lb 3.2 oz (166.6 kg)  01/21/22 (!) 362 lb 6.4 oz (164.4 kg)  09/26/21 (!) 365 lb (165.6 kg)    Physical Exam Vitals and nursing note reviewed.  Constitutional:      General: She is awake. She is in acute distress.     Appearance: Normal appearance. She is well-groomed. She is obese. She is not ill-appearing or toxic-appearing.  HENT:     Head: Normocephalic and atraumatic.     Right Ear: Hearing and external ear normal.     Left Ear: Hearing and external ear normal.     Nose: Congestion present.  Eyes:     Conjunctiva/sclera: Conjunctivae normal.  Cardiovascular:     Rate and Rhythm: Normal rate and regular rhythm.  Pulmonary:     Effort: Pulmonary effort is normal. No accessory muscle usage or respiratory distress.     Breath sounds: Rhonchi present.  Abdominal:     General: Bowel sounds are normal.  Musculoskeletal:     Cervical back: Neck supple.  Lymphadenopathy:     Cervical: No cervical adenopathy.  Skin:    General: Skin is warm.  Neurological:     General: No focal deficit present.     Mental Status: She is alert and oriented to  person, place, and time. Mental status is at baseline.  Psychiatric:        Mood and Affect: Mood normal.        Behavior: Behavior normal. Behavior is cooperative.        Thought Content: Thought content normal.        Judgment: Judgment normal.     Results for orders placed or performed in visit on 01/21/22  HM DIABETES EYE EXAM  Result Value Ref Range   HM Diabetic Eye Exam No Retinopathy No Retinopathy      Assessment & Plan:   Problem List Items Addressed This Visit       Respiratory   Cough variant asthma vs Upper Airway cough syndrome    Chronic, improvement reported by pt. Refer to acute cough POC.         Other   Subacute cough -  Primary    Ongoing with improvement since Augmentin initiated 02/12/22. CXR negative for PNA or acute HF. Continue inhaler regimen and oxygen use at home. Will extend antibiotic duration 3 more days given pt at increased risk for infection with contributing chronic conditions.  -Increase rest and fluids -Acetaminophen as needed for fever/pain.  -Salt water gargling, chloraseptic spray and throat lozenges.  -Air humidification        Follow up plan: Return if symptoms worsen or fail to improve.

## 2022-02-19 NOTE — Assessment & Plan Note (Signed)
Ongoing with improvement since Augmentin initiated 02/12/22. CXR negative for PNA or acute HF. Continue inhaler regimen and oxygen use at home. Will extend antibiotic duration 3 more days given pt at increased risk for infection with contributing chronic conditions.  -Increase rest and fluids -Acetaminophen as needed for fever/pain.  -Salt water gargling, chloraseptic spray and throat lozenges.  -Air humidification

## 2022-03-11 ENCOUNTER — Ambulatory Visit: Payer: Medicare Other

## 2022-03-11 NOTE — Patient Outreach (Signed)
Care Management & Coordination Services Pharmacy Note  03/11/2022 Name:  Sara Zhang MRN:  [id number] DOB:  19-Oct-1970  Summary: -Pleasant 52 year old female presents for initial visit. She enjoys spending time with 1 grandkid and 3 great nieces/nephews  Recommendations/Changes made from today's visit: -Patient was in the car (Not driving) and didn't have sugars on her. Will have team call in 1 week  Subjective: Sara Zhang is an 52 y.o. year old female who is a primary patient of Cannady, Barbaraann Faster, NP.  The care coordination team was consulted for assistance with disease management and care coordination needs.    Engaged with patient by telephone for follow up visit.  Recent office visits:  None since last coordination call on 09/10/21    Recent consult visits:  09/26/21 Antony Madura, PA-C Cardiology ([facility]) Orders placed: EKG; Medication changes: none   Hospital visits:  None in previous 6 months   Objective:  Lab Results  Component Value Date   CREATININE 1.19 (H) 01/21/2022   BUN 20 01/21/2022   EGFR 55 (L) 01/21/2022   GFRNONAA 53 (L) 03/08/2020   GFRAA 61 03/08/2020   NA 140 01/21/2022   K 4.6 01/21/2022   CALCIUM 7.9 (L) 01/21/2022   CO2 24 01/21/2022   GLUCOSE 172 (H) 01/21/2022    Lab Results  Component Value Date/Time   HGBA1C 7.7 (H) 01/21/2022 08:15 AM   HGBA1C 7.1 (H) 03/27/2021 09:16 AM   MICROALBUR 80 (H) 03/27/2021 09:16 AM   MICROALBUR 80 (H) 12/25/2020 10:54 AM    Last diabetic Eye exam:  Lab Results  Component Value Date/Time   HMDIABEYEEXA No Retinopathy 12/11/2021 12:00 AM    Last diabetic Foot exam: No results found for: "HMDIABFOOTEX"   Lab Results  Component Value Date   CHOL 121 01/21/2022   HDL 46 01/21/2022   LDLCALC 62 01/21/2022   TRIG 63 01/21/2022       Latest Ref Rng & Units 01/21/2022    8:17 AM 04/06/2021   11:54 AM 03/27/2021    9:19 AM  Hepatic Function  Total Protein 6.0 - 8.5 g/dL 6.7  6.4  7.0   Albumin  3.8 - 4.9 g/dL 3.6  3.6  4.1   AST 0 - 40 IU/L 11  18  10    ALT 0 - 32 IU/L 7  15  12    Alk Phosphatase 44 - 121 IU/L 113  97  125   Total Bilirubin 0.0 - 1.2 mg/dL 0.3  0.2  0.3     Lab Results  Component Value Date/Time   TSH 4.010 01/21/2022 08:17 AM   TSH 2.780 12/25/2020 10:57 AM   FREET4 1.06 12/25/2020 10:57 AM       Latest Ref Rng & Units 01/21/2022    8:17 AM 04/06/2021   11:54 AM 03/27/2021    9:19 AM  CBC  WBC 3.4 - 10.8 x10E3/uL 8.1  6.3  8.0   Hemoglobin 11.1 - 15.9 g/dL 12.1  12.3  12.6   Hematocrit 34.0 - 46.6 % 38.9  40.6  41.1   Platelets 150 - 450 x10E3/uL 325  302  341     Lab Results  Component Value Date/Time   VD25OH 18.0 (L) 01/21/2022 08:17 AM   VD25OH 48.5 03/27/2021 09:19 AM   VITAMINB12 495 01/21/2022 08:17 AM   VITAMINB12 541 03/27/2021 09:19 AM    Clinical ASCVD: No  The ASCVD Risk score (Arnett DK, et al., 2019) failed to calculate  for the following reasons:   The valid total cholesterol range is 130 to 320 mg/dL    Other: ([id number] if Afib, MMRC or CAT for COPD, ACT, DEXA)     02/19/2022   11:06 AM 02/12/2022    9:59 AM 01/21/2022    8:12 AM  Depression screen PHQ 2/9  Decreased Interest 1 0 0  Down, Depressed, Hopeless 0 0 0  PHQ - 2 Score 1 0 0  Altered sleeping 0 0 0  Tired, decreased energy 1 3 1   Change in appetite 0 0 0  Feeling bad or failure about yourself  0 0   Trouble concentrating 0 0 0  Moving slowly or fidgety/restless 0 0 0  Suicidal thoughts 0 0 0  PHQ-9 Score 2 3 1   Difficult doing work/chores Somewhat difficult Not difficult at all Somewhat difficult     Social History   Tobacco Use  Smoking Status Never  Smokeless Tobacco Never   BP Readings from Last 3 Encounters:  02/19/22 117/77  01/21/22 133/81  09/26/21 128/70   Pulse Readings from Last 3 Encounters:  02/19/22 65  01/21/22 67  09/26/21 76   Wt Readings from Last 3 Encounters:  02/19/22 (!) 367 lb 3.2 oz (166.6 kg)  01/21/22 (!) 362 lb 6.4  oz (164.4 kg)  09/26/21 (!) 365 lb (165.6 kg)   BMI Readings from Last 3 Encounters:  02/19/22 54.20 kg/m  01/21/22 53.49 kg/m  09/26/21 53.90 kg/m    No Known Allergies  Medications Reviewed Today     Reviewed by 01/23/22, NP (Nurse Practitioner) on 02/19/22 at 1201  Med List Status: <None>   Medication Order Taking? Sig Documenting Provider Last Dose Status Informant  albuterol (VENTOLIN HFA) 108 (90 Base) MCG/ACT inhaler Marjie Skiff Yes Inhale 2 puffs into the lungs every 6 (six) hours as needed for wheezing or shortness of breath. 02/21/22 T, NP Taking Active   amLODipine (NORVASC) 5 MG tablet [id number] Yes TAKE 1 TABLET(5 MG) BY MOUTH DAILY End, Aura Dials, MD Taking Active   amoxicillin-clavulanate (AUGMENTIN) 875-125 MG tablet [id number]  Take 1 tablet by mouth 2 (two) times daily for 3 days. Cristal Deer T, NP  Active   atenolol (TENORMIN) 50 MG tablet [id number] Yes TAKE 1 TABLET(50 MG) BY MOUTH DAILY Cannady, Jolene T, NP Taking Active   atorvastatin (LIPITOR) 10 MG tablet Aura Dials Yes TAKE 1 TABLET(10 MG) BY MOUTH DAILY Cannady, Jolene T, NP Taking Active   Blood Glucose Monitoring Suppl (CONTOUR NEXT MONITOR) w/Device KIT [id number] Yes To check blood sugar 4 times daily. [id number] T, NP Taking Active   Calcium Carbonate-Vitamin D (OYSTER SHELL CALCIUM 500 + D) 500-125 MG-UNIT TABS [id number] Yes Take 1 tablet by mouth daily.  [provider] Taking Active Self  chlorpheniramine-HYDROcodone (TUSSIONEX) 10-8 MG/5ML Aura Dials Yes Take 5 mLs by mouth at bedtime as needed for cough. [id number] T, NP Taking Active   Cholecalciferol (VITAMIN D3) 10 MCG (400 UNIT) tablet [id number] Yes Take 400 Units by mouth daily. [provider] Taking Active Self  Dulaglutide (TRULICITY) 3 MG/0.5ML SOPN Aura Dials Yes Inject 3 mg as directed once a week. [id number] T, NP Taking Active   furosemide (LASIX) 40 MG tablet [id number] Yes TAKE 1 TABLET(40  MG) BY MOUTH TWICE DAILY End, Christopher, MD Taking Active   glucose blood (CONTOUR NEXT TEST) test strip Aura Dials Yes TEST BLOOD SUGAR ONCE DAILY Cannady, Jolene T, NP Taking Active Self  Insulin Glargine (BASAGLAR KWIKPEN) 100 UNIT/ML  [id number] Yes Inject 52 Units into the skin at bedtime. [provider] Taking Active   Insulin Pen Needle 32G X 4 MM MISC [id number] Yes 1 Units by Does not apply route every morning. Pen needles Kathrine Haddock, NP Taking Active Self  JARDIANCE 25 MG TABS tablet [id number] Yes TAKE 1 TABLET(25 MG) BY MOUTH DAILY Cannady, Barbaraann Faster, NP Taking Active   Lancets (ONETOUCH ULTRASOFT) lancets [id number] Yes Use as instructed Venita Lick, NP Taking Active   levonorgestrel (MIRENA) 20 MCG/24HR IUD [id number] Yes 1 each by Intrauterine route once.  [provider] Taking Active Self  losartan (COZAAR) 25 MG tablet [id number] Yes TAKE 1 TABLET(25 MG) BY MOUTH DAILY Marnee Guarneri T, NP Taking Active   OXYGEN [id number] Yes Inhale 2 L into the lungs daily. [provider] Taking Active Self           Med Note Brandy Hale Apr 16, 2019  9:42 AM)    Med List Note Sharene Butters, [place] 02/03/19 1401): Bipap            SDOH:  (Social Determinants of Health) assessments and interventions performed: Yes SDOH Interventions    Flowsheet Row Care Coordination from 03/11/2022 in [place] from 09/24/2021 in [place] from 09/20/2019 in [facility] Support from 08/08/2016 in [place]  SDOH Interventions      Food Insecurity Interventions -- Intervention Not Indicated -- --  Housing Interventions -- Intervention Not Indicated -- --  Transportation Interventions Intervention Not Indicated Intervention Not Indicated -- --  Depression Interventions/Treatment  -- -- PHQ2-9 Score <4 Follow-up Not Indicated  Patient refuses Treatment  Financial Strain Interventions Intervention Not Indicated Intervention Not Indicated -- --  Physical Activity Interventions -- Intervention Not Indicated -- --  Stress Interventions -- Intervention Not Indicated -- --  Social Connections Interventions -- Intervention Not Indicated -- --       Medication Assistance: None required.  Patient affirms current coverage meets needs.   Name and location of Current pharmacy:  [facility] [place], [place] - [address] [facility] [place] [address] Phone: [phone] Fax: [phone]   Compliance/Adherence/Medication fill history: Care Gaps: Colonoscopy-03/26/20 (Fecal DNA (Cologuard)) Diabetic Foot Exam-12/25/20 Mammogram-01/26/20 Ophthalmology-12/07/20 Dexa Scan - NA Annual Well Visit - 09/24/21 (Medicare)  Micro albumin-08/14/21 Hemoglobin A1c- 08/14/21   Star Rating Drugs: Atorvastatin 10 mg Last filled:08/27/21 90 ds, 7/00/17 90 ds Trulicity 1.5 mg Last filled: 10/16/21 28 ds, 07/04/21 84 DS Jardiance 25 mg Last filled:09/18/21 30 ds, 08/14/21 30 DS Basaglar 100 unit Last filled:07/30/21 83 DS, 05/02/21 83 ds Losartan 25 mg Last filled:08/28/21 90 DS, 08/23/21 9 ds   Assessment/Plan   Heart Failure (Goal: manage symptoms and prevent exacerbations) BP Readings from Last 3 Encounters:  02/19/22 117/77  01/21/22 133/81  09/26/21 128/70  -Uncontrolled -Last ejection fraction: 11/2018 showed LVEF 55-60%  -HF type: HFpEF (EF > 50%) -NYHA Class: II (slight limitation of activity) -AHA HF Stage: C (Heart disease and symptoms present) -Current treatment: Amlodipine 5mg  Appropriate, Effective, Safe, Accessible Atenolol 50mg  Query Appropriate,  Empagliflozin 25mg  Appropriate, Effective, Safe, Accessible Furosemide 40mg  Appropriate, Effective, Safe, Accessible Losartan 25mg  Appropriate, Effective, Safe, Accessible -Medications previously tried: N/A  -Current home  BP/HR readings:  Feb 2024:  Patient wasn't at home -Current home daily weights:  Feb 2024:  Patient wasn't at  home -Current dietary habits: "Tries to eat healthy" -Current exercise habits: None -Educated on Benefits of medications for managing symptoms and prolonging life Feb 2024: Will send msg to [facility] about changing Atenolol which has negative outcomes to something else for HF   Hyperlipidemia: (LDL goal < 70) The ASCVD Risk score (Arnett DK, et al., 2019) failed to calculate for the following reasons:   The valid total cholesterol range is 130 to 320 mg/dL Lab Results  Component Value Date   CHOL 121 01/21/2022   CHOL 119 08/24/2021   CHOL 124 03/27/2021   Lab Results  Component Value Date   HDL 46 01/21/2022   HDL 44 08/24/2021   HDL 45 03/27/2021   Lab Results  Component Value Date   LDLCALC 62 01/21/2022   LDLCALC 60 08/24/2021   LDLCALC 67 03/27/2021   Lab Results  Component Value Date   TRIG 63 01/21/2022   TRIG 70 08/24/2021   TRIG 53 03/27/2021   No results found for: "CHOLHDL" No results found for: "LDLDIRECT" Last vitamin D Lab Results  Component Value Date   VD25OH 18.0 (L) 01/21/2022   Lab Results  Component Value Date   TSH 4.010 01/21/2022  -Controlled -Current treatment: Atorvastatin 10mg  Appropriate, Effective, Safe, Accessible -Medications previously tried: N/A  -Current dietary patterns: "Tries to eat healthy" -Current exercise habits: None -Educated on Cholesterol goals;  -Recommended to continue current medication  Diabetes (A1c goal <7%) DM Managed by [facility], [place]   Lab Results  Component Value Date   HGBA1C 7.7 (H) 01/21/2022   HGBA1C 7.1 (H) 03/27/2021   HGBA1C 7.3 (H) 12/25/2020   Lab Results  Component Value Date   MICROALBUR 80 (H) 03/27/2021   LDLCALC 62 01/21/2022   CREATININE 1.19 (H) 01/21/2022    Lab Results  Component Value Date   NA 140 01/21/2022   K 4.6  01/21/2022   CREATININE 1.19 (H) 01/21/2022   EGFR 55 (L) 01/21/2022   GFRNONAA 53 (L) 03/08/2020   GLUCOSE 172 (H) 01/21/2022    Lab Results  Component Value Date   WBC 8.1 01/21/2022   HGB 12.1 01/21/2022   HCT 38.9 01/21/2022   MCV 84 01/21/2022   PLT 325 01/21/2022    Lab Results  Component Value Date   MICROALBUR 80 (H) 03/27/2021   MICROALBUR 80 (H) 12/25/2020  -Uncontrolled -Current medications: Dulaglutide 3mg /week Appropriate, Query effective,  Empagliflozin 25mg  Appropriate, Query effective,  Glargine 52 units Appropriate, Query effective,  Wt Readings from Last 3 Encounters:  02/19/22 (!) 367 lb 3.2 oz (166.6 kg)  01/21/22 (!) 362 lb 6.4 oz (164.4 kg)  09/26/21 (!) 365 lb (165.6 kg)  -Medications previously tried: None  -Current home glucose readings fasting glucose:  Feb 2024: Was in the car, didn't have readings on her -Denies hypoglycemic/hyperglycemic symptoms -Current exercise: None -Educated on A1c and blood sugar goals; -Counseled to check feet daily and get yearly eye exams Feb 2024: Will have team call in a week for sugar readings  CPP F/U August 2024  [place] Constable, [place].D. - [phone]

## 2022-03-21 ENCOUNTER — Ambulatory Visit: Payer: Medicare Other

## 2022-03-25 ENCOUNTER — Ambulatory Visit (INDEPENDENT_AMBULATORY_CARE_PROVIDER_SITE_OTHER): Payer: Medicare Other | Admitting: Internal Medicine

## 2022-03-25 VITALS — BP 125/83 | HR 76 | Resp 16 | Ht 68.0 in | Wt 361.8 lb

## 2022-03-25 DIAGNOSIS — Z7189 Other specified counseling: Secondary | ICD-10-CM | POA: Diagnosis not present

## 2022-03-25 DIAGNOSIS — G4733 Obstructive sleep apnea (adult) (pediatric): Secondary | ICD-10-CM | POA: Diagnosis not present

## 2022-03-25 NOTE — Patient Instructions (Signed)

## 2022-03-25 NOTE — Progress Notes (Unsigned)
[facility] [place], [place] [address]  Pulmonary Sleep Medicine   Office Visit Note  Patient Name: DEANI MELBOURNE DOB: 07/24/1970 MRN [id number]    Chief Complaint: Obstructive Sleep Apnea visit  Brief History:  Cosma is seen today for an annual follow up on Auto BIPAP  max IPAP 25, min EPAP 12, PS 5. The patient has a 9 year  history of sleep apnea. Patient is using PAP nightly.  The patient feels rested after sleeping with PAP.  The patient reports benefit from PAP use. Reported sleepiness is  improved and the Epworth Sleepiness Score is 3 out of 24. The patient does not take naps. The patient complains of the following: inability to use machine as much last two months due to being sick with respiratory illness.  The compliance download shows 75% compliance with an average use time of 6 hours 51 min. The AHI is 2.5  The patient does not complain of limb movements disrupting sleep.  ROS  General: (-) fever, (-) chills, (-) night sweat Nose and Sinuses: (-) nasal stuffiness or itchiness, (-) postnasal drip, (-) nosebleeds, (-) sinus trouble. Mouth and Throat: (-) sore throat, (-) hoarseness. Neck: (-) swollen glands, (-) enlarged thyroid, (-) neck pain. Respiratory: - cough, - shortness of breath, - wheezing. Neurologic: - numbness, - tingling. Psychiatric: - anxiety, - depression   Current Medication: Outpatient Encounter Medications as of 03/25/2022  Medication Sig   albuterol (VENTOLIN HFA) 108 (90 Base) MCG/ACT inhaler Inhale 2 puffs into the lungs every 6 (six) hours as needed for wheezing or shortness of breath.   amLODipine (NORVASC) 5 MG tablet TAKE 1 TABLET(5 MG) BY MOUTH DAILY   atenolol (TENORMIN) 50 MG tablet TAKE 1 TABLET(50 MG) BY MOUTH DAILY   atorvastatin (LIPITOR) 10 MG tablet TAKE 1 TABLET(10 MG) BY MOUTH DAILY   Blood Glucose Monitoring Suppl (CONTOUR NEXT MONITOR) w/Device KIT To check blood sugar 4 times daily.   Calcium  Carbonate-Vitamin D (OYSTER SHELL CALCIUM 500 + D) 500-125 MG-UNIT TABS Take 1 tablet by mouth daily.    chlorpheniramine-HYDROcodone (TUSSIONEX) 10-8 MG/5ML Take 5 mLs by mouth at bedtime as needed for cough.   Cholecalciferol (VITAMIN D3) 10 MCG (400 UNIT) tablet Take 400 Units by mouth daily.   Dulaglutide (TRULICITY) 3 0000000 SOPN Inject 3 mg as directed once a week.   furosemide (LASIX) 40 MG tablet TAKE 1 TABLET(40 MG) BY MOUTH TWICE DAILY   glucose blood (CONTOUR NEXT TEST) test strip TEST BLOOD SUGAR ONCE DAILY   Insulin Glargine (BASAGLAR KWIKPEN) 100 UNIT/ML Inject 52 Units into the skin at bedtime.   Insulin Pen Needle 32G X 4 MM MISC 1 Units by Does not apply route every morning. Pen needles   JARDIANCE 25 MG TABS tablet TAKE 1 TABLET(25 MG) BY MOUTH DAILY   Lancets (ONETOUCH ULTRASOFT) lancets Use as instructed   levonorgestrel (MIRENA) 20 MCG/24HR IUD 1 each by Intrauterine route once.    losartan (COZAAR) 25 MG tablet TAKE 1 TABLET(25 MG) BY MOUTH DAILY   OXYGEN Inhale 2 L into the lungs daily.   No facility-administered encounter medications on file as of 03/25/2022.    Surgical History: Past Surgical History:  Procedure Laterality Date   CESAREAN SECTION     CHOLECYSTECTOMY     DILATION AND CURETTAGE, DIAGNOSTIC / THERAPEUTIC     TONSILLECTOMY AND ADENOIDECTOMY     TOOTH EXTRACTION  01/2016    Medical History: Past Medical History:  Diagnosis  Date   Abnormal uterine bleeding (AUB) 11/25/2016   Chronic diastolic CHF (congestive heart failure) ([place])    a. 02/2012 Echo: EF 25-35%, glob HK; b. 06/2016 Echo: EF 55-60%, mild MR, nl PASP.   CKD (chronic kidney disease), stage II    COVID-19    Diabetes mellitus without complication ([place])    Extreme obesity    Hyperlipidemia    Hypertension    Hypoxia    Microcytic anemia    Pneumonia    Pneumonia    Sleep apnea    Status post endometrial ablation 06/20/2016   2015 hysteroscopy/D&C with NovaSure endometrial  ablation; pathology-benign endometrial polyps   Thickened endometrium 06/20/2016   Ultrasound-22.3 mm endometrial stripe, heterogenous   Vaginal bleeding    a. 01/2017 s/p hyteroscopy and polypectomy [facility]).   Vitamin D deficiency     Family History: Non contributory to the present illness  Social History: Social History   Socioeconomic History   Marital status: Single    Spouse name: Not on file   Number of children: Not on file   Years of education: GED   Highest education level: GED or equivalent  Occupational History   Occupation: disability  Tobacco Use   Smoking status: Never   Smokeless tobacco: Never  Vaping Use   Vaping Use: Never used  Substance and Sexual Activity   Alcohol use: Yes    Alcohol/week: 0.0 standard drinks of alcohol    Comment: 1-2 beers/month   Drug use: No   Sexual activity: Yes    Birth control/protection: None  Other Topics Concern   Not on file  Social History Narrative   The patient's son lives with her.  The patient recently lost an aunt in December 2020 and feels she got C19 related to the amount of family and friends coming in and out to pay respects to her aunt.    Social Determinants of Health   Financial Resource Strain: Low Risk  (03/11/2022)   Overall Financial Resource Strain (CARDIA)    Difficulty of Paying Living Expenses: Not hard at all  Food Insecurity: No Food Insecurity (09/24/2021)   Hunger Vital Sign    Worried About Running Out of Food in the Last Year: Never true    Ran Out of Food in the Last Year: Never true  Transportation Needs: No Transportation Needs (03/11/2022)   PRAPARE - Hydrologist (Medical): No    Lack of Transportation (Non-Medical): No  Physical Activity: Inactive (09/24/2021)   Exercise Vital Sign    Days of Exercise per Week: 0 days    Minutes of Exercise per Session: 0 min  Stress: No Stress Concern Present (09/24/2021)   [place]    Feeling of Stress : Not at all  Social Connections: Unknown (09/24/2021)   Social Connection and Isolation Panel [NHANES]    Frequency of Communication with Friends and Family: More than three times a week    Frequency of Social Gatherings with Friends and Family: Once a week    Attends Religious Services: Never    Marine scientist or Organizations: No    Attends Archivist Meetings: Never    Marital Status: Not on file  Intimate Partner Violence: Not At Risk (09/24/2021)   Humiliation, Afraid, Rape, and Kick questionnaire    Fear of Current or Ex-Partner: No    Emotionally Abused: No    Physically Abused: No  Sexually Abused: No    Vital Signs: Blood pressure 125/83, pulse 76, resp. rate 16, height 5' 8"$  (1.727 m), weight (!) 361 lb 12.8 oz (164.1 kg), SpO2 96 %. Body mass index is 55.01 kg/m.    Examination: General Appearance: The patient is well-developed, well-nourished, and in no distress. Neck Circumference: 44cm Skin: Gross inspection of skin unremarkable. Head: normocephalic, no gross deformities. Eyes: no gross deformities noted. ENT: ears appear grossly normal Neurologic: Alert and oriented. No involuntary movements.  STOP BANG RISK ASSESSMENT S (snore) Have you been told that you snore?     No   T (tired) Are you often tired, fatigued, or sleepy during the day?   NO  O (obstruction) Do you stop breathing, choke, or gasp during sleep? NO   P (pressure) Do you have or are you being treated for high blood pressure? YES   B (BMI) Is your body index greater than 35 kg/m? YES   A (age) Are you 19 years old or older? YES   N (neck) Do you have a neck circumference greater than 16 inches?   YES   G (gender) Are you a female? /NO   TOTAL STOP/BANG "YES" ANSWERS 4       A STOP-Bang score of 2 or less is considered low risk, and a score of 5 or more is high risk for having either moderate or severe OSA. For  people who score 3 or 4, doctors may need to perform further assessment to determine how likely they are to have OSA.         EPWORTH SLEEPINESS SCALE:  Scale:  (0)= no chance of dozing; (1)= slight chance of dozing; (2)= moderate chance of dozing; (3)= high chance of dozing  Chance  Situtation    Sitting and reading: 0    Watching TV: 1    Sitting Inactive in public: 0    As a passenger in car: 0      Lying down to rest: 1    Sitting and talking: 0    Sitting quielty after lunch: 1    In a car, stopped in traffic: 0   TOTAL SCORE:   3 out of 24    SLEEP STUDIES:  SPLIT (05/10/13)  AHI 24.1, SPO2 82%, recommended Auto BIPAP and supplemental O2 @ 1.5 lpm   CPAP COMPLIANCE DATA:  Date Range: 03/20/21-03/19/22  Average Daily Use: 6 hours 52 min  Median Use: 6 hours 51 min  Compliance for > 4 Hours: 75% days  AHI: 2.5 respiratory events per hour  Days Used: 283/365  Mask Leak: 75.8  95th Percentile Pressure: 18.4/13/4   PS 5         LABS: Recent Results (from the past 2160 hour(s))  Bayer DCA Hb A1c Waived     Status: Abnormal   Collection Time: 01/21/22  8:15 AM  Result Value Ref Range   HB A1C (BAYER DCA - WAIVED) 7.7 (H) 4.8 - 5.6 %    Comment:          Prediabetes: 5.7 - 6.4          Diabetes: >6.4          Glycemic control for adults with diabetes: <7.0   Comprehensive metabolic panel     Status: Abnormal   Collection Time: 01/21/22  8:17 AM  Result Value Ref Range   Glucose 172 (H) 70 - 99 mg/dL   BUN 20 6 - 24 mg/dL   Creatinine,  Ser 1.19 (H) 0.57 - 1.00 mg/dL   eGFR 55 (L) >59 mL/min/1.73   BUN/Creatinine Ratio 17 9 - 23   Sodium 140 134 - 144 mmol/L   Potassium 4.6 3.5 - 5.2 mmol/L   Chloride 101 96 - 106 mmol/L   CO2 24 20 - 29 mmol/L   Calcium 7.9 (L) 8.7 - 10.2 mg/dL   Total Protein 6.7 6.0 - 8.5 g/dL   Albumin 3.6 (L) 3.8 - 4.9 g/dL   Globulin, Total 3.1 1.5 - 4.5 g/dL   Albumin/Globulin Ratio 1.2 1.2 - 2.2   Bilirubin  Total 0.3 0.0 - 1.2 mg/dL   Alkaline Phosphatase 113 44 - 121 IU/L   AST 11 0 - 40 IU/L   ALT 7 0 - 32 IU/L  CBC with Differential/Platelet     Status: Abnormal   Collection Time: 01/21/22  8:17 AM  Result Value Ref Range   WBC 8.1 3.4 - 10.8 x10E3/uL   RBC 4.66 3.77 - 5.28 x10E6/uL   Hemoglobin 12.1 11.1 - 15.9 g/dL   Hematocrit 38.9 34.0 - 46.6 %   MCV 84 79 - 97 fL   MCH 26.0 (L) 26.6 - 33.0 pg   MCHC 31.1 (L) 31.5 - 35.7 g/dL   RDW 13.7 11.7 - 15.4 %   Platelets 325 150 - 450 x10E3/uL   Neutrophils 57 Not Estab. %   Lymphs 27 Not Estab. %   Monocytes 9 Not Estab. %   Eos 5 Not Estab. %   Basos 1 Not Estab. %   Neutrophils Absolute 4.7 1.4 - 7.0 x10E3/uL   Lymphocytes Absolute 2.2 0.7 - 3.1 x10E3/uL   Monocytes Absolute 0.8 0.1 - 0.9 x10E3/uL   EOS (ABSOLUTE) 0.4 0.0 - 0.4 x10E3/uL   Basophils Absolute 0.0 0.0 - 0.2 x10E3/uL   Immature Granulocytes 1 Not Estab. %   Immature Grans (Abs) 0.0 0.0 - 0.1 x10E3/uL  Lipid Panel w/o Chol/HDL Ratio     Status: None   Collection Time: 01/21/22  8:17 AM  Result Value Ref Range   Cholesterol, Total 121 100 - 199 mg/dL   Triglycerides 63 0 - 149 mg/dL   HDL 46 >39 mg/dL   VLDL Cholesterol Cal 13 5 - 40 mg/dL   LDL Chol Calc (NIH) 62 0 - 99 mg/dL  TSH     Status: None   Collection Time: 01/21/22  8:17 AM  Result Value Ref Range   TSH 4.010 0.450 - 4.500 uIU/mL  VITAMIN D 25 Hydroxy (Vit-D Deficiency, Fractures)     Status: Abnormal   Collection Time: 01/21/22  8:17 AM  Result Value Ref Range   Vit D, 25-Hydroxy 18.0 (L) 30.0 - 100.0 ng/mL    Comment: Vitamin D deficiency has been defined by the Institute of Medicine and an Endocrine Society practice guideline as a level of serum 25-OH vitamin D less than 20 ng/mL (1,2). The Endocrine Society went on to further define vitamin D insufficiency as a level between 21 and 29 ng/mL (2). 1. IOM (Institute of Medicine). 2010. Dietary reference    intakes for calcium and D. [place]: The    [employer]. 2. Holick MF, Binkley [place], Bischoff-Ferrari HA, et al.    Evaluation, treatment, and prevention of vitamin D    deficiency: an Endocrine Society clinical practice    guideline. JCEM. 2011 Jul; 96(7):1911-30.   Vitamin B12     Status: None   Collection Time: 01/21/22  8:17 AM  Result  Value Ref Range   Vitamin B-12 495 232 - 1,245 pg/mL    Radiology: DG Chest 2 View  Result Date: 02/12/2022 CLINICAL DATA:  Cough for 2 weeks, no improvement. EXAM: CHEST - 2 VIEW COMPARISON:  PA Lat 04/06/2021 FINDINGS: There is mild cardiomegaly, unchanged. No vascular congestion is seen. The mediastinum is normally outlined. No pleural effusion is evident. There is chronic left lateral basal pleuroparenchymal scarring change. Central bronchial thickening of bronchitis is noted similar to previous study but no focal pneumonia is seen. There is thoracic spondylosis and bridging enthesopathy. IMPRESSION: 1. Central bronchial thickening of bronchitis without evidence of acute pneumonia. 2. Chronic left lateral basal pleuroparenchymal scarring. 3. Mild cardiomegaly. Electronically Signed   By: Telford Nab M.D.   On: 02/12/2022 20:14    No results found.  No results found.    Assessment and Plan: Patient Active Problem List   Diagnosis Date Noted   Hypocalcemia 02/17/2022   Subacute cough 01/21/2022   B12 deficiency 03/27/2021   IUD (intrauterine device) in place 12/24/2020   Cough variant asthma vs Upper Air[place] cough syndrome 10/14/2020   CKD (chronic kidney disease) stage 3, GFR 30-59 ml/min (HCC) 06/06/2020   OSA on CPAP 03/27/2020   Proteinuria due to type 2 diabetes mellitus ([place]) 06/15/2019   Chronic heart failure with preserved ejection fraction ([place]) 10/22/2018   NICM (nonischemic cardiomyopathy) ([place]) 10/22/2018   BMI 50.0-59.9, adult ([place]) 11/18/2017   Type 2 diabetes mellitus treated with insulin ([place]) 04/25/2015   Hyperlipidemia associated with type 2  diabetes mellitus ([place]) 11/10/2014   Morbid obesity ([place]) 11/10/2014   Vitamin D deficiency 11/10/2014   Benign hypertensive heart and kidney disease with CHF and stage 2 chronic kidney disease ([place]) 11/10/2014   1. OSA treated with BiPAP The patient does tolerate PAP and reports  benefit from PAP use. The patient was reminded how to clean equipment and advised to replace supplies routinely. She has some mask leak and will change her seal to a medium. The patient was also counselled on weight loss. The compliance is good. The AHI is 2.5.   OSA treated with bipap and oxygen. Pt is controlled and doing well. Continue with pap compliance, bipap continues to be medically necessary to treat this patient's OSA.    2. Encounter for BiPAP use counseling  Counseling: had a lengthy discussion with the patient regarding the importance of PAP therapy in management of the sleep apnea. Patient appears to understand the risk factor reduction and also understands the risks associated with untreated sleep apnea. Patient will try to make a good faith effort to remain compliant with therapy. Also instructed the patient on proper cleaning of the device including the water must be changed daily if possible and use of distilled water is preferred. Patient understands that the machine should be regularly cleaned with appropriate recommended cleaning solutions that do not damage the PAP machine for example given white vinegar and water rinses. Other methods such as ozone treatment may not be as good as these simple methods to achieve cleaning.   3. Morbid obesity ([place]) Obesity Counseling: Had a lengthy discussion regarding patients BMI and weight issues. Patient was instructed on portion control as well as increased activity. Also discussed caloric restrictions with trying to maintain intake less than 2000 Kcal. Discussions were made in accordance with the 5As of weight management. Simple actions such as not eating late and  if able to, taking a walk is suggested.     General  Counseling: I have discussed the findings of the evaluation and examination with Lattie Haw.  I have also discussed any further diagnostic evaluation thatmay be needed or ordered today. Tanaysha verbalizes understanding of the findings of todays visit. We also reviewed her medications today and discussed drug interactions and side effects including but not limited excessive drowsiness and altered mental states. We also discussed that there is always a risk not just to her but also people around her. she has been encouraged to call the office with any questions or concerns that should arise related to todays visit.  No orders of the defined types were placed in this encounter.       I have personally obtained a history, examined the patient, evaluated laboratory and imaging results, formulated the assessment and plan and placed orders. This patient was seen today by Tressie Ellis, PA-C in collaboration with Dr. Devona Konig.   Allyne Gee, MD [facility] Diplomate ABMS Pulmonary Critical Care Medicine and Sleep Medicine

## 2022-03-28 NOTE — Progress Notes (Signed)
Follow-up Outpatient Visit Date: 03/29/2022  Primary Care Provider: Venita Lick, NP [place] [address]  Chief Complaint: Follow-up heart failure  HPI:  Sara Zhang is a 52 y.o. female with history of heart failure (LVEF reportedly 25-35% in 2014 with subsequent normalization), HTN, HLD, OSA on CPAP, COVID-19 in 01/2019, and type 2 DM, who presents for follow-up of heart failure.  She was last seen in our office in 09/2021 by Cadence Furth, PA, at which time she was doing well.  Chronic shortness of breath was unchanged from baseline.  She denied chest pain.  Intermittent furosemide was controlling her lower extremity edema.  She remained compliant with CPAP.  No medication changes or additional testing were pursued.  Today, [name] reports that she feels fairly well, similar to our last visit.  She has had minimal leg edema and denies significant dyspnea as well as chest pain, palpitations, and lightheadedness.  She is frustrated by her continued obesity and difficulty losing weight.  --------------------------------------------------------------------------------------------------  Cardiovascular History & Procedures: Cardiovascular Problems: Chronic systolic heart failure with normalization of LVEF   Risk Factors: Hypertension, hyperlipidemia, diabetes mellitus, and obesity   Cath/PCI: None   CV Surgery: None   EP Procedures and Devices: None   Non-Invasive Evaluation(s): TTE (11/23/2018): Normal LV size with mild LVH.  LVEF 55-60% with normal diastolic function.  Normal RV size and function.  Mild biatrial enlargement.  No significant valvular abnormality. TTE (06/10/16): Normal LV size and function with LVEF of 55-60%. Normal diastolic parameters. Mild MR. Normal RV size and function. Normal pulmonary artery pressure. Report of LVEF 25-35% with global hypokinesis by outside echo on her before 03/04/12.  Recent CV Pertinent Labs: Lab Results   Component Value Date   CHOL 121 01/21/2022   CHOL 100 04/01/2019   HDL 46 01/21/2022   LDLCALC 62 01/21/2022   TRIG 63 01/21/2022   TRIG 66 04/01/2019   BNP 18.1 04/06/2021   BNP 27.9 02/03/2019   K 4.6 01/21/2022   K 4.2 09/25/2013   MG 2.4 (H) 12/25/2020   BUN 20 01/21/2022   BUN 12 09/25/2013   CREATININE 1.19 (H) 01/21/2022   CREATININE 0.75 09/25/2013    Past medical and surgical history were reviewed and updated in EPIC.  Current Meds  Medication Sig   albuterol (VENTOLIN HFA) 108 (90 Base) MCG/ACT inhaler Inhale 2 puffs into the lungs every 6 (six) hours as needed for wheezing or shortness of breath.   amLODipine (NORVASC) 5 MG tablet TAKE 1 TABLET(5 MG) BY MOUTH DAILY   atenolol (TENORMIN) 50 MG tablet TAKE 1 TABLET(50 MG) BY MOUTH DAILY   atorvastatin (LIPITOR) 10 MG tablet TAKE 1 TABLET(10 MG) BY MOUTH DAILY   Blood Glucose Monitoring Suppl (CONTOUR NEXT MONITOR) w/Device KIT To check blood sugar 4 times daily.   Calcium Carbonate-Vitamin D (OYSTER SHELL CALCIUM 500 + D) 500-125 MG-UNIT TABS Take 1 tablet by mouth daily.    chlorpheniramine-HYDROcodone (TUSSIONEX) 10-8 MG/5ML Take 5 mLs by mouth at bedtime as needed for cough.   Cholecalciferol (VITAMIN D3) 10 MCG (400 UNIT) tablet Take 400 Units by mouth daily.   Dulaglutide (TRULICITY) 3 0000000 SOPN Inject 3 mg as directed once a week.   furosemide (LASIX) 40 MG tablet TAKE 1 TABLET(40 MG) BY MOUTH TWICE DAILY   glucose blood (CONTOUR NEXT TEST) test strip TEST BLOOD SUGAR ONCE DAILY   Insulin Glargine (BASAGLAR KWIKPEN) 100 UNIT/ML Inject 52 Units into the skin at  bedtime.   Insulin Pen Needle 32G X 4 MM MISC 1 Units by Does not apply route every morning. Pen needles   JARDIANCE 25 MG TABS tablet TAKE 1 TABLET(25 MG) BY MOUTH DAILY   Lancets (ONETOUCH ULTRASOFT) lancets Use as instructed   levonorgestrel (MIRENA) 20 MCG/24HR IUD 1 each by Intrauterine route once.    losartan (COZAAR) 25 MG tablet TAKE 1  TABLET(25 MG) BY MOUTH DAILY   OXYGEN Inhale 2 L into the lungs daily.    Allergies: Patient has no known allergies.  Social History   Tobacco Use   Smoking status: Never   Smokeless tobacco: Never  Vaping Use   Vaping Use: Never used  Substance Use Topics   Alcohol use: Yes    Alcohol/week: 0.0 standard drinks of alcohol    Comment: 1-2 beers/month   Drug use: No    Family History  Problem Relation Age of Onset   Diabetes Mother    Hyperlipidemia Mother    Hypertension Mother    Stroke Mother    Arthritis Father    Asthma Father    Diabetes Father    Hypertension Father    Hypertension Brother    Diabetes Brother    Diabetes Maternal Grandmother    Hypertension Maternal Grandmother    Diabetes Maternal Grandfather    Hypertension Maternal Grandfather    Diabetes Paternal Grandmother    Hypertension Paternal Grandmother    Heart attack Maternal Uncle    Breast cancer Neg Hx     Review of Systems: A 12-system review of systems was performed and was negative except as noted in the HPI.  --------------------------------------------------------------------------------------------------  Physical Exam: BP 120/80 (BP Location: Left Arm, Patient Position: Sitting, Cuff Size: Large)   Pulse 79   Ht '5\' 8"'$  (1.727 m)   Wt (!) 359 lb (162.8 kg)   SpO2 94%   BMI 54.59 kg/m  Repeat BP: 126/74  General:  NAD. Neck: No JVD or HJR, though body habitus limits evaluation. Lungs: Clear to auscultation bilaterally without wheezes or crackles. Heart: Distant heart sounds.  Regular rate and rhythm without murmurs, rubs, or gallops. Abdomen: Soft, nontender, nondistended. Extremities: No lower extremity edema.  EKG: Normal sinus rhythm with first-degree AV block (PR interval 226 ms) and poor R wave progression.  No significant change from prior tracing on 09/26/2021.  Lab Results  Component Value Date   WBC 8.1 01/21/2022   HGB 12.1 01/21/2022   HCT 38.9 01/21/2022   MCV  84 01/21/2022   PLT 325 01/21/2022    Lab Results  Component Value Date   NA 140 01/21/2022   K 4.6 01/21/2022   CL 101 01/21/2022   CO2 24 01/21/2022   BUN 20 01/21/2022   CREATININE 1.19 (H) 01/21/2022   GLUCOSE 172 (H) 01/21/2022   ALT 7 01/21/2022    Lab Results  Component Value Date   CHOL 121 01/21/2022   HDL 46 01/21/2022   LDLCALC 62 01/21/2022   TRIG 63 01/21/2022    --------------------------------------------------------------------------------------------------  ASSESSMENT AND PLAN: Chronic HFrEF with recovered ejection fraction: Ms. Ruggiano appears grossly euvolemic though her body habitus makes evaluation difficult.  She has stable NYHA class II symptoms.  Continue current medications.  Weight loss again encouraged.  Hypertension: Blood pressure upper normal today (goal less than 130/80).  Continue current medications and work on lifestyle modifications.  Hyperlipidemia associated with type 2 diabetes mellitus: Lipids well-controlled on last check in December.  Continue current dose of atorvastatin  with ongoing management of diabetes mellitus per Ms. Cannady.  Morbid obesity: BMI remains greater than 50, though Ms. Sessom has lost 6 pounds since our last visit.  I congratulated her on this and encouraged her to keep working on weight loss through diet and exercise.  Follow-up: Return to clinic in 1 year.  Nelva Bush, MD 03/29/2022 8:17 AM

## 2022-03-29 ENCOUNTER — Encounter: Payer: Self-pay | Admitting: Internal Medicine

## 2022-03-29 ENCOUNTER — Ambulatory Visit: Payer: Medicare Other | Attending: Internal Medicine | Admitting: Internal Medicine

## 2022-03-29 ENCOUNTER — Other Ambulatory Visit: Payer: Self-pay | Admitting: Nurse Practitioner

## 2022-03-29 VITALS — BP 126/74 | HR 79 | Ht 68.0 in | Wt 359.0 lb

## 2022-03-29 DIAGNOSIS — E785 Hyperlipidemia, unspecified: Secondary | ICD-10-CM | POA: Insufficient documentation

## 2022-03-29 DIAGNOSIS — I10 Essential (primary) hypertension: Secondary | ICD-10-CM | POA: Insufficient documentation

## 2022-03-29 DIAGNOSIS — E1169 Type 2 diabetes mellitus with other specified complication: Secondary | ICD-10-CM | POA: Diagnosis not present

## 2022-03-29 DIAGNOSIS — I5022 Chronic systolic (congestive) heart failure: Secondary | ICD-10-CM | POA: Diagnosis not present

## 2022-03-29 NOTE — Patient Instructions (Signed)
Medication Instructions:  Your Physician recommend you continue on your current medication as directed.    *If you need a refill on your cardiac medications before your next appointment, please call your pharmacy*   Lab Work: None ordered today   Testing/Procedures: None ordered today   Follow-Up: At [facility], you and your health needs are our priority.  As part of our continuing mission to provide you with exceptional heart care, we have created designated Provider Care Teams.  These Care Teams include your primary Cardiologist (physician) and Advanced Practice Providers (APPs -  Physician Assistants and Nurse Practitioners) who all work together to provide you with the care you need, when you need it.  We recommend signing up for the patient portal called "MyChart".  Sign up information is provided on this After Visit Summary.  MyChart is used to connect with patients for Virtual Visits (Telemedicine).  Patients are able to view lab/test results, encounter notes, upcoming appointments, etc.  Non-urgent messages can be sent to your provider as well.   To learn more about what you can do with MyChart, go to [email or web address].    Your next appointment:   1 year(s)  Provider:   You may see Nelva Bush, MD or one of the following Advanced Practice Providers on your designated Care Team:   Murray Hodgkins, NP Christell Faith, PA-C Cadence Kathlen Mody, PA-C Gerrie Nordmann, NP

## 2022-04-01 NOTE — Telephone Encounter (Signed)
Requested Prescriptions  Pending Prescriptions Disp Refills   albuterol (VENTOLIN HFA) 108 (90 Base) MCG/ACT inhaler [Pharmacy Med Name: ALBUTEROL HFA INH(200 PUFFS) 18GM] 18 g 2    Sig: INHALE 2 PUFFS INTO THE LUNGS EVERY 6 HOURS AS NEEDED FOR WHEEZING OR SHORTNESS OF BREATH     Pulmonology:  Beta Agonists 2 Passed - 03/29/2022  5:25 PM      Passed - Last BP in normal range    BP Readings from Last 1 Encounters:  03/29/22 126/74         Passed - Last Heart Rate in normal range    Pulse Readings from Last 1 Encounters:  03/29/22 79         Passed - Valid encounter within last 12 months    Recent Outpatient Visits           1 month ago Subacute cough   [place], [place] T, NP   1 month ago Acute cough   [place], [place] T, NP   2 months ago Type 2 diabetes mellitus treated with insulin ([place])   [place], [place] T, NP   7 months ago Type 2 diabetes mellitus treated with insulin ([place])   [place], Henrine Screws T, NP   11 months ago Acute cough   [place], Barbaraann Faster, NP       Future Appointments             In 4 weeks [place], Barbaraann Faster, NP [place], [facility]

## 2022-04-10 ENCOUNTER — Telehealth: Payer: Self-pay

## 2022-04-10 NOTE — Progress Notes (Cosign Needed)
.Care Management & Coordination Services Pharmacy Team  Reason for Encounter: Diabetes  Contacted patient to discuss diabetes disease state. Unsuccessful outreach. Left voicemail for patient to return call.   Recent office visits:  None noted  Recent consult visits:  03/29/22-Christopher End, MD (Cardiology) Follow up visit on heart failure. Follow up in 1 year.  03/25/22-Saadat Richardson Dopp, MD (Pulmonology) Seen for sleep apnea.   Hospital visits:  None in previous 6 months  Medications: Outpatient Encounter Medications as of 04/10/2022  Medication Sig   albuterol (VENTOLIN HFA) 108 (90 Base) MCG/ACT inhaler INHALE 2 PUFFS INTO THE LUNGS EVERY 6 HOURS AS NEEDED FOR WHEEZING OR SHORTNESS OF BREATH   amLODipine (NORVASC) 5 MG tablet TAKE 1 TABLET(5 MG) BY MOUTH DAILY   atenolol (TENORMIN) 50 MG tablet TAKE 1 TABLET(50 MG) BY MOUTH DAILY   atorvastatin (LIPITOR) 10 MG tablet TAKE 1 TABLET(10 MG) BY MOUTH DAILY   Blood Glucose Monitoring Suppl (CONTOUR NEXT MONITOR) w/Device KIT To check blood sugar 4 times daily.   Calcium Carbonate-Vitamin D (OYSTER SHELL CALCIUM 500 + D) 500-125 MG-UNIT TABS Take 1 tablet by mouth daily.    chlorpheniramine-HYDROcodone (TUSSIONEX) 10-8 MG/5ML Take 5 mLs by mouth at bedtime as needed for cough.   Cholecalciferol (VITAMIN D3) 10 MCG (400 UNIT) tablet Take 400 Units by mouth daily.   Dulaglutide (TRULICITY) 3 0000000 SOPN Inject 3 mg as directed once a week.   furosemide (LASIX) 40 MG tablet TAKE 1 TABLET(40 MG) BY MOUTH TWICE DAILY   glucose blood (CONTOUR NEXT TEST) test strip TEST BLOOD SUGAR ONCE DAILY   Insulin Glargine (BASAGLAR KWIKPEN) 100 UNIT/ML Inject 52 Units into the skin at bedtime.   Insulin Pen Needle 32G X 4 MM MISC 1 Units by Does not apply route every morning. Pen needles   JARDIANCE 25 MG TABS tablet TAKE 1 TABLET(25 MG) BY MOUTH DAILY   Lancets (ONETOUCH ULTRASOFT) lancets Use as instructed   levonorgestrel (MIRENA) 20 MCG/24HR IUD 1  each by Intrauterine route once.    losartan (COZAAR) 25 MG tablet TAKE 1 TABLET(25 MG) BY MOUTH DAILY   OXYGEN Inhale 2 L into the lungs daily.   No facility-administered encounter medications on file as of 04/10/2022.    Recent Relevant Labs: Lab Results  Component Value Date/Time   HGBA1C 7.7 (H) 01/21/2022 08:15 AM   HGBA1C 7.1 (H) 03/27/2021 09:16 AM   MICROALBUR 80 (H) 03/27/2021 09:16 AM   MICROALBUR 80 (H) 12/25/2020 10:54 AM    Kidney Function Lab Results  Component Value Date/Time   CREATININE 1.19 (H) 01/21/2022 08:17 AM   CREATININE 0.92 04/12/2021 08:37 AM   CREATININE 0.75 09/25/2013 02:00 PM   CREATININE 0.89 06/17/2013 11:51 AM   GFRNONAA 53 (L) 03/08/2020 09:29 AM   GFRNONAA >60 02/04/2020 11:51 PM   GFRNONAA >60 09/25/2013 02:00 PM   GFRAA 61 03/08/2020 09:29 AM   GFRAA >60 09/25/2013 02:00 PM   Current antihyperglycemic regimen:  Trulicity inject 3 mg once a week Basaglar inject 52 units at bedtime Jardiance 25 mg take 1 tablet once daily    Adherence Review: Is the patient currently on a STATIN medication? Yes Is the patient currently on ACE/ARB medication? Yes Does the patient have >5 day gap between last estimated fill dates? No  Star Rating Drugs:  Atorvastatin 10 mg Last filled:02/18/22 90 DS, [id number] 90 DS Trulicity 3 mg Last [id number] 84 DS, 12/10/21 84 DS Basaglar 100 units Last filled:03/06/22 83 DS, 12/10/21 83 DS  Jardiance 25 mg Last filled:03/20/22 30 DS, 02/19/22 30 DS Losartan 25 mg Last filled:02/22/22 90 DS, 11/21/21 90 DS   Care Gaps: Annual wellness visit in last year? Yes Last eye exam / retinopathy screening:12/2021 Last diabetic foot exam:01/2022   Corrie Mckusick, RMA

## 2022-04-11 ENCOUNTER — Encounter: Payer: Self-pay | Admitting: Nurse Practitioner

## 2022-04-11 ENCOUNTER — Ambulatory Visit (INDEPENDENT_AMBULATORY_CARE_PROVIDER_SITE_OTHER): Payer: Medicare Other | Admitting: Nurse Practitioner

## 2022-04-11 VITALS — BP 135/80 | HR 89 | Temp 98.4°F | Wt 360.0 lb

## 2022-04-11 DIAGNOSIS — T148XXA Other injury of unspecified body region, initial encounter: Secondary | ICD-10-CM | POA: Insufficient documentation

## 2022-04-11 DIAGNOSIS — R1031 Right lower quadrant pain: Secondary | ICD-10-CM | POA: Insufficient documentation

## 2022-04-11 LAB — URINALYSIS, ROUTINE W REFLEX MICROSCOPIC
Bilirubin, UA: NEGATIVE
Ketones, UA: NEGATIVE
Leukocytes,UA: NEGATIVE
Nitrite, UA: NEGATIVE
Specific Gravity, UA: 1.01 (ref 1.005–1.030)
Urobilinogen, Ur: 0.2 mg/dL (ref 0.2–1.0)
pH, UA: 5.5 (ref 5.0–7.5)

## 2022-04-11 LAB — MICROSCOPIC EXAMINATION: Bacteria, UA: NONE SEEN

## 2022-04-11 LAB — WET PREP FOR TRICH, YEAST, CLUE
Clue Cell Exam: POSITIVE — AB
Trichomonas Exam: NEGATIVE
Yeast Exam: NEGATIVE

## 2022-04-11 MED ORDER — LIDOCAINE 5 % EX PTCH: 1.0000 | MEDICATED_PATCH | CUTANEOUS | 0 refills | Status: DC

## 2022-04-11 MED ORDER — METRONIDAZOLE 500 MG PO TABS: 500.0000 mg | ORAL_TABLET | Freq: Two times a day (BID) | ORAL | 0 refills | Status: AC

## 2022-04-11 MED ORDER — TIZANIDINE HCL 4 MG PO TABS: 4.0000 mg | ORAL_TABLET | Freq: Four times a day (QID) | ORAL | 0 refills | Status: DC | PRN

## 2022-04-11 NOTE — Assessment & Plan Note (Signed)
Acute after severe cough in January, mainly to right shoulder and right lateral ribs.  No red flag symptoms and no acute distress present.  Will send in Tizanidine and Lidocaine patches for pain.  Recommend continue Tylenol as needed + alternate heat/ice.  Gentle stretches at home.  Return as scheduled in 2 weeks, sooner if worsening.

## 2022-04-11 NOTE — Patient Instructions (Signed)
Muscle Strain A muscle strain is an injury that occurs when a muscle is stretched beyond its normal length. Usually, a small number of muscle fibers are torn when this happens. There are three types of muscle strains. First-degree strains have the least amount of muscle fiber tearing and the least amount of pain. Second-degree and third-degree strains have more tearing and pain. Usually, recovery from muscle strain takes 1-2 weeks. Complete healing normally takes 5-6 weeks. What are the causes? This condition is caused when a sudden, violent force is placed on a muscle and stretches it too far. This may occur with a fall, while lifting, or during sports. What increases the risk? This condition is more likely to develop in athletes and people who are physically active. What are the signs or symptoms? Symptoms of this condition include: Pain. Tenderness. Bruising. Swelling. Trouble using the muscle. How is this diagnosed? This condition is diagnosed based on a physical exam and your medical history. Tests may also be done, including an X-ray, ultrasound, or MRI. How is this treated? This condition is initially treated with PRICE therapy. This therapy involves: Protecting the muscle from being injured again. Resting the injured muscle. Icing the injured muscle. Applying pressure (compression) to the injured muscle. This may be done with a splint or elastic bandage. Raising (elevating) the injured muscle. Your health care provider may also recommend medicine for pain. Follow these instructions at home: If you have a removable splint: Wear the splint as told by your health care provider. Remove it only as told by your health care provider. Check the skin around the splint every day. Tell your health care provider about any concerns. Loosen the splint if your fingers or toes tingle, become numb, or turn cold and blue. Keep the splint clean. If the splint is not waterproof: Do not let it get  wet. Cover it with a watertight covering when you take a bath or a shower. Managing pain, stiffness, and swelling  If directed, put ice on the injured area. To do this: If you have a removable splint, remove it as told by your health care provider. Put ice in a plastic bag. Place a towel between your skin and the bag. Leave the ice on for 20 minutes, 2-3 times a day. Remove the ice if your skin turns bright red. This is very important. If you cannot feel pain, heat, or cold, you have a greater risk of damage to the area. Move your fingers or toes often to reduce stiffness and swelling. Raise (elevate) the injured area above the level of your heart while you are sitting or lying down. Wear an elastic bandage as told by your health care provider. Make sure that it is not too tight. General instructions Take over-the-counter and prescription medicines only as told by your health care provider. Treatment may include muscle relaxants or medicines for pain and inflammation that are taken by mouth or applied to the skin. Restrict your activity and rest the injured muscle as told by your health care provider. Gentle movements may be allowed. If physical therapy was prescribed, do exercises as told by your health care provider. Do not put pressure on any part of the splint until it is fully hardened. This may take several hours. Do not use any products that contain nicotine or tobacco. These products include cigarettes, chewing tobacco, and vaping devices, such as e-cigarettes. If you need help quitting, ask your health care provider. Ask your health care provider when it is   safe to drive if you have a splint. Keep all follow-up visits. This is important. How is this prevented? Warm up before exercising. This helps to prevent future muscle strains. Contact a health care provider if: You have more pain or swelling in the injured area. Get help right away if: You have numbness or tingling in the  injured area. You lose a lot of strength in the injured area. Summary A muscle strain is an injury that occurs when a muscle is stretched beyond its normal length. This condition is caused when a sudden, violent force is placed on a muscle and stretches it too far. This condition is initially treated with PRICE therapy, which involves protecting, resting, icing, compressing, and elevating. Gentle movements may be allowed. If physical therapy was prescribed, do exercises as told by your health care provider. This information is not intended to replace advice given to you by your health care provider. Make sure you discuss any questions you have with your health care provider. Document Revised: 04/10/2020 Document Reviewed: 04/10/2020 Elsevier Patient Education  2023 Elsevier Inc.  

## 2022-04-11 NOTE — Progress Notes (Signed)
BP 135/80   Pulse 89   Temp 98.4 F (36.9 C) (Oral)   Wt (!) 360 lb (163.3 kg)   SpO2 93%   BMI 54.74 kg/m    Subjective:    Patient ID: Sara Zhang, female    DOB: 03/30/1970, 52 y.o.   MRN: [id number]  HPI: Sara Zhang is a 51 y.o. female  Chief Complaint  Patient presents with   Pain    Pt states she has been having pain in her R side for the last week. States it comes and goes but feels a sharp pain when it comes    ABDOMINAL PAIN  Having right mid abdominal pain for the past, been present since January when had severe cough -- felt like muscle pull.  Has gotten worse.  Under her right breast feels heavy when pain present.  Pain radiates into shoulder.  Denies CP recently.  Has had some SOB with the pain, at baseline her SOB is stable.    States this feels like muscle pain -- in right shoulder and down right side. Duration:weeks Onset: gradual Severity: 6/10 at present, but 10/10 at worst Quality: dull, aching, and throbbing -- punching kind of pain Location:  RLQ to right mid quadrant and up into shoulder Episode duration: minutes Radiation: no Frequency: intermittent Alleviating factors: certain position changes Aggravating factors: nothing Status: fluctuating Treatments attempted: Tylenol Fever: no Nausea: no Vomiting: no Weight loss: no Decreased appetite: no Diarrhea:  all day on Sunday, better now Constipation: no Blood in stool: no Heartburn: no Jaundice: no Rash: no Dysuria/urinary frequency: no Hematuria: no History of sexually transmitted disease: no Recurrent NSAID use: no   Relevant past medical, surgical, family and social history reviewed and updated as indicated. Interim medical history since our last visit reviewed. Allergies and medications reviewed and updated.  Review of Systems  Constitutional:  Negative for activity change, appetite change, diaphoresis, fatigue and fever.  Respiratory:  Positive for shortness of breath  (baseline). Negative for cough, chest tightness and wheezing.   Cardiovascular:  Negative for chest pain, palpitations and leg swelling.  Gastrointestinal:  Positive for abdominal pain. Negative for abdominal distention, constipation, nausea and vomiting.  Musculoskeletal:  Positive for arthralgias.  Neurological: Negative.   Psychiatric/Behavioral: Negative.      Per HPI unless specifically indicated above     Objective:    BP 135/80   Pulse 89   Temp 98.4 F (36.9 C) (Oral)   Wt (!) 360 lb (163.3 kg)   SpO2 93%   BMI 54.74 kg/m   Wt Readings from Last 3 Encounters:  04/11/22 (!) 360 lb (163.3 kg)  03/29/22 (!) 359 lb (162.8 kg)  03/25/22 (!) 361 lb 12.8 oz (164.1 kg)    Physical Exam Vitals and nursing note reviewed.  Constitutional:      General: She is awake. She is not in acute distress.    Appearance: She is well-developed and well-groomed. She is morbidly obese. She is not ill-appearing.  HENT:     Head: Normocephalic.     Right Ear: Hearing, tympanic membrane, ear canal and external ear normal.     Left Ear: Hearing, tympanic membrane, ear canal and external ear normal.     Nose: No rhinorrhea.     Right Sinus: No maxillary sinus tenderness or frontal sinus tenderness.     Left Sinus: No maxillary sinus tenderness or frontal sinus tenderness.     Mouth/Throat:     Pharynx: No pharyngeal  swelling, oropharyngeal exudate or posterior oropharyngeal erythema.  Eyes:     General: Lids are normal. No scleral icterus.       Right eye: No discharge.        Left eye: No discharge.     Conjunctiva/sclera: Conjunctivae normal.     Pupils: Pupils are equal, round, and reactive to light.  Neck:     Thyroid: No thyromegaly.     Vascular: No carotid bruit.  Cardiovascular:     Rate and Rhythm: Normal rate and regular rhythm.     Heart sounds: Normal heart sounds. No murmur heard.    No gallop.  Pulmonary:     Effort: Pulmonary effort is normal. No accessory muscle usage  or respiratory distress.     Breath sounds: Normal breath sounds. No decreased breath sounds, wheezing or rhonchi.  Abdominal:     General: Abdomen is protuberant. Bowel sounds are normal. There is no distension.     Palpations: Abdomen is soft.     Tenderness: There is no abdominal tenderness. There is no right CVA tenderness or left CVA tenderness. Negative signs include Rovsing's sign, McBurney's sign and psoas sign.     Comments: Mild tenderness to along lateral right ribs and down into mid abdomen right side lateral on palpation.  Musculoskeletal:     Right shoulder: Tenderness (to trapezius) present. No swelling, laceration, bony tenderness or crepitus. Decreased range of motion (reduced flexion and lateral). Decreased strength. Normal pulse.     Left shoulder: Normal.     Cervical back: Normal range of motion and neck supple.     Right lower leg: 1+ Edema present.     Left lower leg: 1+ Edema present.  Lymphadenopathy:     Cervical: No cervical adenopathy.  Skin:    General: Skin is warm and dry.     Capillary Refill: Capillary refill takes less than 2 seconds.  Neurological:     Mental Status: She is alert and oriented to person, place, and time.  Psychiatric:        Attention and Perception: Attention normal.        Mood and Affect: Mood and affect normal.        Speech: Speech normal.        Behavior: Behavior normal. Behavior is cooperative.        Thought Content: Thought content normal.     Results for orders placed or performed in visit on 01/21/22  HM DIABETES EYE EXAM  Result Value Ref Range   HM Diabetic Eye Exam No Retinopathy No Retinopathy      Assessment & Plan:   Problem List Items Addressed This Visit       Musculoskeletal and Integument   Muscle strain    Acute after severe cough in January, mainly to right shoulder and right lateral ribs.  No red flag symptoms and no acute distress present.  Will send in Tizanidine and Lidocaine patches for pain.   Recommend continue Tylenol as needed + alternate heat/ice.  Gentle stretches at home.  Return as scheduled in 2 weeks, sooner if worsening.        Other   Right lower quadrant abdominal pain - Primary    Acute, UA overall reassuring.  Wet prep with +clue cells.  She has had BV before and aware of diagnosis.  Educated on treatment.  Flagyl 500 MG BID for 7 days sent in for patient.  Return in 2 weeks as scheduled.  Sooner if worsening.  Relevant Orders   Urinalysis, Routine w reflex microscopic   WET PREP FOR TRICH, YEAST, CLUE     Follow up plan: Return for as scheduled March 25th.

## 2022-04-11 NOTE — Assessment & Plan Note (Signed)
Acute, UA overall reassuring.  Wet prep with +clue cells.  She has had BV before and aware of diagnosis.  Educated on treatment.  Flagyl 500 MG BID for 7 days sent in for patient.  Return in 2 weeks as scheduled.  Sooner if worsening.

## 2022-04-12 ENCOUNTER — Telehealth: Payer: Self-pay

## 2022-04-12 NOTE — Telephone Encounter (Signed)
PA started for Lidocaine 5% patches through [employer]. Awaiting on determination  

## 2022-04-29 ENCOUNTER — Ambulatory Visit: Payer: Medicare Other | Admitting: Nurse Practitioner

## 2022-04-29 ENCOUNTER — Emergency Department: Payer: Medicare Other

## 2022-04-29 ENCOUNTER — Other Ambulatory Visit: Payer: Self-pay

## 2022-04-29 ENCOUNTER — Observation Stay
Admission: EM | Admit: 2022-04-29 | Discharge: 2022-04-30 | Disposition: A | Discharge status: Home / Self Care | Payer: Medicare Other | Attending: Internal Medicine | Admitting: Internal Medicine

## 2022-04-29 ENCOUNTER — Encounter: Payer: Self-pay | Admitting: Emergency Medicine

## 2022-04-29 DIAGNOSIS — N183 Chronic kidney disease, stage 3 unspecified: Secondary | ICD-10-CM | POA: Diagnosis not present

## 2022-04-29 DIAGNOSIS — Z7984 Long term (current) use of oral hypoglycemic drugs: Secondary | ICD-10-CM | POA: Insufficient documentation

## 2022-04-29 DIAGNOSIS — K7689 Other specified diseases of liver: Secondary | ICD-10-CM | POA: Diagnosis not present

## 2022-04-29 DIAGNOSIS — I5032 Chronic diastolic (congestive) heart failure: Secondary | ICD-10-CM

## 2022-04-29 DIAGNOSIS — N1831 Chronic kidney disease, stage 3a: Secondary | ICD-10-CM

## 2022-04-29 DIAGNOSIS — R1011 Right upper quadrant pain: Secondary | ICD-10-CM | POA: Diagnosis not present

## 2022-04-29 DIAGNOSIS — E119 Type 2 diabetes mellitus without complications: Secondary | ICD-10-CM

## 2022-04-29 DIAGNOSIS — K838 Other specified diseases of biliary tract: Secondary | ICD-10-CM | POA: Diagnosis not present

## 2022-04-29 DIAGNOSIS — Z794 Long term (current) use of insulin: Secondary | ICD-10-CM | POA: Diagnosis not present

## 2022-04-29 DIAGNOSIS — I130 Hypertensive heart and chronic kidney disease with heart failure and stage 1 through stage 4 chronic kidney disease, or unspecified chronic kidney disease: Secondary | ICD-10-CM | POA: Diagnosis not present

## 2022-04-29 DIAGNOSIS — E1129 Type 2 diabetes mellitus with other diabetic kidney complication: Secondary | ICD-10-CM

## 2022-04-29 DIAGNOSIS — R109 Unspecified abdominal pain: Secondary | ICD-10-CM | POA: Diagnosis not present

## 2022-04-29 DIAGNOSIS — N281 Cyst of kidney, acquired: Secondary | ICD-10-CM | POA: Diagnosis not present

## 2022-04-29 DIAGNOSIS — Z8616 Personal history of COVID-19: Secondary | ICD-10-CM | POA: Diagnosis not present

## 2022-04-29 DIAGNOSIS — Z79899 Other long term (current) drug therapy: Secondary | ICD-10-CM | POA: Insufficient documentation

## 2022-04-29 DIAGNOSIS — G4733 Obstructive sleep apnea (adult) (pediatric): Secondary | ICD-10-CM

## 2022-04-29 DIAGNOSIS — I428 Other cardiomyopathies: Secondary | ICD-10-CM

## 2022-04-29 DIAGNOSIS — K5730 Diverticulosis of large intestine without perforation or abscess without bleeding: Secondary | ICD-10-CM | POA: Diagnosis not present

## 2022-04-29 DIAGNOSIS — Z6841 Body Mass Index (BMI) 40.0 and over, adult: Secondary | ICD-10-CM

## 2022-04-29 DIAGNOSIS — Z7985 Long-term (current) use of injectable non-insulin antidiabetic drugs: Secondary | ICD-10-CM | POA: Diagnosis not present

## 2022-04-29 DIAGNOSIS — R933 Abnormal findings on diagnostic imaging of other parts of digestive tract: Secondary | ICD-10-CM | POA: Diagnosis not present

## 2022-04-29 DIAGNOSIS — R932 Abnormal findings on diagnostic imaging of liver and biliary tract: Secondary | ICD-10-CM | POA: Insufficient documentation

## 2022-04-29 DIAGNOSIS — K5790 Diverticulosis of intestine, part unspecified, without perforation or abscess without bleeding: Secondary | ICD-10-CM | POA: Diagnosis not present

## 2022-04-29 DIAGNOSIS — I10 Essential (primary) hypertension: Secondary | ICD-10-CM | POA: Diagnosis not present

## 2022-04-29 DIAGNOSIS — E785 Hyperlipidemia, unspecified: Secondary | ICD-10-CM

## 2022-04-29 DIAGNOSIS — J45991 Cough variant asthma: Secondary | ICD-10-CM

## 2022-04-29 DIAGNOSIS — E559 Vitamin D deficiency, unspecified: Secondary | ICD-10-CM

## 2022-04-29 DIAGNOSIS — E1122 Type 2 diabetes mellitus with diabetic chronic kidney disease: Secondary | ICD-10-CM | POA: Diagnosis not present

## 2022-04-29 LAB — COMPREHENSIVE METABOLIC PANEL
ALT: 14 U/L (ref 0–44)
AST: 13 U/L — ABNORMAL LOW (ref 15–41)
Albumin: 3.4 g/dL — ABNORMAL LOW (ref 3.5–5.0)
Alkaline Phosphatase: 88 U/L (ref 38–126)
Anion gap: 11 (ref 5–15)
BUN: 18 mg/dL (ref 6–20)
CO2: 29 mmol/L (ref 22–32)
Calcium: 8.4 mg/dL — ABNORMAL LOW (ref 8.9–10.3)
Chloride: 98 mmol/L (ref 98–111)
Creatinine, Ser: 0.98 mg/dL (ref 0.44–1.00)
GFR, Estimated: >60 mL/min (ref >60)
Glucose, Bld: 108 mg/dL — ABNORMAL HIGH (ref 70–99)
Potassium: 3.8 mmol/L (ref 3.5–5.1)
Sodium: 138 mmol/L (ref 135–145)
Total Bilirubin: 1 mg/dL (ref 0.3–1.2)
Total Protein: 7.8 g/dL (ref 6.5–8.1)

## 2022-04-29 LAB — CBC
HCT: 41.6 % (ref 36.0–46.0)
Hemoglobin: 12.5 g/dL (ref 12.0–15.0)
MCH: 26.4 pg (ref 26.0–34.0)
MCHC: 30 g/dL (ref 30.0–36.0)
MCV: 87.9 fL (ref 80.0–100.0)
Platelets: 331 10*3/uL (ref 150–400)
RBC: 4.73 MIL/uL (ref 3.87–5.11)
RDW: 14.8 % (ref 11.5–15.5)
WBC: 8.5 10*3/uL (ref 4.0–10.5)
nRBC: 0 % (ref 0.0–0.2)

## 2022-04-29 LAB — GLUCOSE, CAPILLARY
Glucose-Capillary: 99 mg/dL (ref 70–99)
Glucose-Capillary: 159 mg/dL — ABNORMAL HIGH (ref 70–99)

## 2022-04-29 LAB — LIPASE, BLOOD: Lipase: 31 U/L (ref 11–51)

## 2022-04-29 MED ORDER — SODIUM CHLORIDE 0.9% FLUSH
3.0000 mL | Freq: Two times a day (BID) | INTRAVENOUS | Status: DC
  Administered 2022-04-29 – 2022-04-30 (×2): 3 mL via INTRAVENOUS

## 2022-04-29 MED ORDER — INSULIN GLARGINE-YFGN 100 UNIT/ML ~~LOC~~ SOLN
20.0000 [IU] | Freq: Every day | SUBCUTANEOUS | Status: DC
  Administered 2022-04-29: 20 [IU] via SUBCUTANEOUS
  Filled 2022-04-29 (×2): qty 0.2

## 2022-04-29 MED ORDER — INSULIN ASPART 100 UNIT/ML IJ SOLN: 0.0000 [IU] | Freq: Three times a day (TID) | INTRAMUSCULAR | Status: DC

## 2022-04-29 MED ORDER — FUROSEMIDE 40 MG PO TABS: 40.0000 mg | ORAL_TABLET | Freq: Two times a day (BID) | ORAL | Status: DC

## 2022-04-29 MED ORDER — ONDANSETRON HCL 4 MG PO TABS: 4.0000 mg | ORAL_TABLET | Freq: Four times a day (QID) | ORAL | Status: DC | PRN

## 2022-04-29 MED ORDER — ATENOLOL 50 MG PO TABS
50.0000 mg | ORAL_TABLET | Freq: Every day | ORAL | Status: DC
  Administered 2022-04-30: 50 mg via ORAL
  Filled 2022-04-29: qty 1
  Filled 2022-04-29: qty 2

## 2022-04-29 MED ORDER — ACETAMINOPHEN 650 MG RE SUPP: 650.0000 mg | Freq: Four times a day (QID) | RECTAL | Status: DC | PRN

## 2022-04-29 MED ORDER — EMPAGLIFLOZIN 25 MG PO TABS
25.0000 mg | ORAL_TABLET | Freq: Every day | ORAL | Status: DC
  Administered 2022-04-30: 25 mg via ORAL
  Filled 2022-04-29: qty 1

## 2022-04-29 MED ORDER — ONDANSETRON HCL 4 MG/2ML IJ SOLN
4.0000 mg | Freq: Once | INTRAMUSCULAR | Status: AC
  Administered 2022-04-29: 4 mg via INTRAVENOUS
  Filled 2022-04-29: qty 2

## 2022-04-29 MED ORDER — AMLODIPINE BESYLATE 5 MG PO TABS
5.0000 mg | ORAL_TABLET | Freq: Every day | ORAL | Status: DC
  Administered 2022-04-30: 5 mg via ORAL
  Filled 2022-04-29: qty 1

## 2022-04-29 MED ORDER — HYDROCODONE-ACETAMINOPHEN 5-325 MG PO TABS
1.0000 | ORAL_TABLET | Freq: Four times a day (QID) | ORAL | Status: DC | PRN
  Administered 2022-04-29 – 2022-04-30 (×3): 1 via ORAL
  Filled 2022-04-29 (×3): qty 1

## 2022-04-29 MED ORDER — IOHEXOL 350 MG/ML SOLN
100.0000 mL | Freq: Once | INTRAVENOUS | Status: AC | PRN
  Administered 2022-04-29: 100 mL via INTRAVENOUS

## 2022-04-29 MED ORDER — MORPHINE SULFATE (PF) 4 MG/ML IV SOLN
4.0000 mg | Freq: Once | INTRAVENOUS | Status: AC
  Administered 2022-04-29: 4 mg via INTRAVENOUS
  Filled 2022-04-29: qty 1

## 2022-04-29 MED ORDER — HYDROMORPHONE HCL 1 MG/ML IJ SOLN: 1.0000 mg | INTRAMUSCULAR | Status: DC | PRN

## 2022-04-29 MED ORDER — ACETAMINOPHEN 325 MG PO TABS: 650.0000 mg | ORAL_TABLET | Freq: Four times a day (QID) | ORAL | Status: DC | PRN

## 2022-04-29 MED ORDER — GADOBUTROL 1 MMOL/ML IV SOLN
10.0000 mL | Freq: Once | INTRAVENOUS | Status: AC | PRN
  Administered 2022-04-29: 10 mL via INTRAVENOUS

## 2022-04-29 MED ORDER — ALBUTEROL SULFATE (2.5 MG/3ML) 0.083% IN NEBU: 3.0000 mL | INHALATION_SOLUTION | Freq: Four times a day (QID) | RESPIRATORY_TRACT | Status: DC | PRN

## 2022-04-29 MED ORDER — ENOXAPARIN SODIUM 100 MG/ML IJ SOSY
0.5000 mg/kg | PREFILLED_SYRINGE | INTRAMUSCULAR | Status: DC
  Administered 2022-04-29: 82.5 mg via SUBCUTANEOUS
  Filled 2022-04-29: qty 0.82

## 2022-04-29 MED ORDER — METHOCARBAMOL 1000 MG/10ML IJ SOLN
500.0000 mg | Freq: Once | INTRAVENOUS | Status: AC
  Administered 2022-04-29: 500 mg via INTRAVENOUS
  Filled 2022-04-29: qty 500

## 2022-04-29 MED ORDER — ATORVASTATIN CALCIUM 10 MG PO TABS
10.0000 mg | ORAL_TABLET | Freq: Every day | ORAL | Status: DC
  Administered 2022-04-29 – 2022-04-30 (×2): 10 mg via ORAL
  Filled 2022-04-29 (×2): qty 1

## 2022-04-29 MED ORDER — ONDANSETRON HCL 4 MG/2ML IJ SOLN: 4.0000 mg | Freq: Four times a day (QID) | INTRAMUSCULAR | Status: DC | PRN

## 2022-04-29 MED ORDER — LOSARTAN POTASSIUM 25 MG PO TABS
25.0000 mg | ORAL_TABLET | Freq: Every day | ORAL | Status: DC
  Administered 2022-04-30: 25 mg via ORAL
  Filled 2022-04-29: qty 1

## 2022-04-29 MED ORDER — LACTATED RINGERS IV BOLUS: 500.0000 mL | Freq: Once | INTRAVENOUS | Status: DC

## 2022-04-29 MED ORDER — ENOXAPARIN SODIUM 80 MG/0.8ML IJ SOSY
0.5000 mg/kg | PREFILLED_SYRINGE | INTRAMUSCULAR | Status: DC
  Filled 2022-04-29: qty 0.82

## 2022-04-29 MED ORDER — HYDROMORPHONE HCL 1 MG/ML IJ SOLN
1.0000 mg | INTRAMUSCULAR | Status: DC | PRN
  Administered 2022-04-29 – 2022-04-30 (×2): 1 mg via INTRAVENOUS
  Filled 2022-04-29 (×2): qty 1

## 2022-04-29 NOTE — Consult Note (Signed)
Inpatient Consultation   Patient ID: Sara Zhang is a 52 y.o. female.  Requesting Provider: Arta Silence, MD (emergency medicine)  Date of Admission: 04/29/2022  Date of Consult: 04/29/22   Reason for Consultation: abnormal imaging- mri mrcp   Patient's Chief Complaint:   Chief Complaint  Patient presents with   Abdominal Pain    52 year old African-American female with HFrEF (most recently EF improved to 55-60%), chronic hypoxic respiratory failure 2 L nasal cannula, OSA on CPAP, hypertension, HLD, DM2, CKD who presented to the hospital with right-sided pain.  GI is consulted for abnormal imaging findings.  Patient noted that she started having right-sided pain approximately 3 weeks ago.  This pain initially went away but has returned and worsened and severity.  Initially was after respiratory viral illness causing frequent coughing.  The pain is positional and worse when she sits up.  Her appetite has been maintained and she has not lost any weight.  No fevers, chills, nausea, vomiting.  No melena hematochezia or chalk like bowel movements.  Bowels otherwise moving regularly  She underwent a CT in the emergency department and demonstrated intrahepatic dilation of only the left liver.  There is also some lobe atrophy.  Right side was normal.  She is status post cholecystectomy.  This imaging was followed up with MRI MRCP demonstrating segmental biliary dilation of the left lobe.  Demonstrated left lobe atrophy similar to 2013.  No visualized lesion in this area.  CBD noted to be dilated to 14 mm.  No obstruction or lesion appreciated at the ampulla on imaging.  Right-sided intrahepatics are within normal limits.  Pancreas is also within normal limits.  No issues seen with bowel or stomach on imaging.  LFTs are within normal limits and no leukocytosis.  Denies NSAIDs, Anti-plt agents, and anticoagulants Denies family history of gastrointestinal disease and  malignancy Previous Endoscopies: none    Past Medical History:  Diagnosis Date   Abnormal uterine bleeding (AUB) 11/25/2016   Chronic diastolic CHF (congestive heart failure) ([place])    a. 02/2012 Echo: EF 25-35%, glob HK; b. 06/2016 Echo: EF 55-60%, mild MR, nl PASP.   CKD (chronic kidney disease), stage II    COVID-19    Diabetes mellitus without complication ([place])    Extreme obesity    Hyperlipidemia    Hypertension    Hypoxia    Microcytic anemia    Pneumonia    Pneumonia    Sleep apnea    Status post endometrial ablation 06/20/2016   2015 hysteroscopy/D&C with NovaSure endometrial ablation; pathology-benign endometrial polyps   Thickened endometrium 06/20/2016   Ultrasound-22.3 mm endometrial stripe, heterogenous   Vaginal bleeding    a. 01/2017 s/p hyteroscopy and polypectomy [facility]).   Vitamin D deficiency     Past Surgical History:  Procedure Laterality Date   CESAREAN SECTION     CHOLECYSTECTOMY     DILATION AND CURETTAGE, DIAGNOSTIC / THERAPEUTIC     TONSILLECTOMY AND ADENOIDECTOMY     TOOTH EXTRACTION  01/2016    No Known Allergies  Family History  Problem Relation Age of Onset   Diabetes Mother    Hyperlipidemia Mother    Hypertension Mother    Stroke Mother    Arthritis Father    Asthma Father    Diabetes Father    Hypertension Father    Hypertension Brother    Diabetes Brother    Diabetes Maternal Grandmother    Hypertension Maternal Grandmother    Diabetes Maternal  Grandfather    Hypertension Maternal Grandfather    Diabetes Paternal Grandmother    Hypertension Paternal Grandmother    Heart attack Maternal Uncle    Breast cancer Neg Hx     Social History   Tobacco Use   Smoking status: Never   Smokeless tobacco: Never  Vaping Use   Vaping Use: Never used  Substance Use Topics   Alcohol use: Yes    Alcohol/week: 0.0 standard drinks of alcohol    Comment: 1-2 beers/month   Drug use: No     Pertinent GI related history and allergies  were reviewed with the patient  Review of Systems  Constitutional:  Negative for activity change, appetite change, chills, diaphoresis, fatigue, fever and unexpected weight change.  HENT:  Negative for trouble swallowing and voice change.   Respiratory:  Negative for shortness of breath and wheezing.   Cardiovascular:  Negative for chest pain, palpitations and leg swelling.  Gastrointestinal:  Positive for abdominal pain. Negative for abdominal distention, anal bleeding, blood in stool, constipation, diarrhea, nausea, rectal pain and vomiting.  Musculoskeletal:  Negative for arthralgias and myalgias.  Skin:  Negative for color change and pallor.  Neurological:  Negative for dizziness, syncope and weakness.  Psychiatric/Behavioral:  Negative for confusion.   All other systems reviewed and are negative.    Medications Home Medications No current facility-administered medications on file prior to encounter.   Current Outpatient Medications on File Prior to Encounter  Medication Sig Dispense Refill   albuterol (VENTOLIN HFA) 108 (90 Base) MCG/ACT inhaler INHALE 2 PUFFS INTO THE LUNGS EVERY 6 HOURS AS NEEDED FOR WHEEZING OR SHORTNESS OF BREATH 18 g 2   amLODipine (NORVASC) 5 MG tablet TAKE 1 TABLET(5 MG) BY MOUTH DAILY 90 tablet 0   atenolol (TENORMIN) 50 MG tablet TAKE 1 TABLET(50 MG) BY MOUTH DAILY 90 tablet 0   atorvastatin (LIPITOR) 10 MG tablet TAKE 1 TABLET(10 MG) BY MOUTH DAILY 90 tablet 3   Calcium Carbonate-Vitamin D (OYSTER SHELL CALCIUM 500 + D) 500-125 MG-UNIT TABS Take 1 tablet by mouth daily.      Cholecalciferol (VITAMIN D3) 10 MCG (400 UNIT) tablet Take 400 Units by mouth daily.     Dulaglutide (TRULICITY) 3 0000000 SOPN Inject 3 mg as directed once a week. 6 mL 4   furosemide (LASIX) 40 MG tablet TAKE 1 TABLET(40 MG) BY MOUTH TWICE DAILY 180 tablet 1   Insulin Glargine (BASAGLAR KWIKPEN) 100 UNIT/ML Inject 52 Units into the skin at bedtime.     JARDIANCE 25 MG TABS tablet  TAKE 1 TABLET(25 MG) BY MOUTH DAILY 90 tablet 0   levonorgestrel (MIRENA) 20 MCG/24HR IUD 1 each by Intrauterine route once.      losartan (COZAAR) 25 MG tablet TAKE 1 TABLET(25 MG) BY MOUTH DAILY 90 tablet 4   Blood Glucose Monitoring Suppl (CONTOUR NEXT MONITOR) w/Device KIT To check blood sugar 4 times daily. 1 kit 0   glucose blood (CONTOUR NEXT TEST) test strip TEST BLOOD SUGAR ONCE DAILY 100 each 2   Insulin Pen Needle 32G X 4 MM MISC 1 Units by Does not apply route every morning. Pen needles 90 each 3   Lancets (ONETOUCH ULTRASOFT) lancets Use as instructed 100 each 12   lidocaine (LIDODERM) 5 % Place 1 patch onto the skin daily. Remove & Discard patch within 12 hours or as directed by MD (Patient not taking: Reported on 04/29/2022) 30 patch 0   OXYGEN Inhale 2 L into the lungs daily.  Pertinent GI related medications were reviewed with the patient  Inpatient Medications  Current Facility-Administered Medications:    acetaminophen (TYLENOL) tablet 650 mg, 650 mg, Oral, Q6H PRN **OR** acetaminophen (TYLENOL) suppository 650 mg, 650 mg, Rectal, Q6H PRN, Jose Persia, MD   albuterol (PROVENTIL) (2.5 MG/3ML) 0.083% nebulizer solution 3 mL, 3 mL, Inhalation, Q6H PRN, Jose Persia, MD   [START ON 04/30/2022] amLODipine (NORVASC) tablet 5 mg, 5 mg, Oral, Daily, Jose Persia, MD   atenolol (TENORMIN) tablet 50 mg, 50 mg, Oral, Daily, Jose Persia, MD   atorvastatin (LIPITOR) tablet 10 mg, 10 mg, Oral, Daily, Jose Persia, MD   [START ON 04/30/2022] empagliflozin (JARDIANCE) tablet 25 mg, 25 mg, Oral, Daily, Jose Persia, MD   enoxaparin (LOVENOX) injection 82.5 mg, 0.5 mg/kg, Subcutaneous, Q24H, Jose Persia, MD   furosemide (LASIX) tablet 40 mg, 40 mg, Oral, BID, Jose Persia, MD   HYDROcodone-acetaminophen (NORCO/VICODIN) 5-325 MG per tablet 1 tablet, 1 tablet, Oral, Q6H PRN, Jose Persia, MD   HYDROmorphone (DILAUDID) injection 1 mg, 1 mg, Intravenous, Q3H PRN,  Jose Persia, MD   insulin aspart (novoLOG) injection 0-15 Units, 0-15 Units, Subcutaneous, TID WC, Jose Persia, MD   insulin glargine-yfgn (SEMGLEE) injection 20 Units, 20 Units, Subcutaneous, QHS, Jose Persia, MD   [START ON 04/30/2022] losartan (COZAAR) tablet 25 mg, 25 mg, Oral, Daily, Jose Persia, MD   ondansetron (ZOFRAN) tablet 4 mg, 4 mg, Oral, Q6H PRN **OR** ondansetron (ZOFRAN) injection 4 mg, 4 mg, Intravenous, Q6H PRN, Jose Persia, MD   sodium chloride flush (NS) 0.9 % injection 3 mL, 3 mL, Intravenous, Q12H, Jose Persia, MD  Current Outpatient Medications:    albuterol (VENTOLIN HFA) 108 (90 Base) MCG/ACT inhaler, INHALE 2 PUFFS INTO THE LUNGS EVERY 6 HOURS AS NEEDED FOR WHEEZING OR SHORTNESS OF BREATH, Disp: 18 g, Rfl: 2   amLODipine (NORVASC) 5 MG tablet, TAKE 1 TABLET(5 MG) BY MOUTH DAILY, Disp: 90 tablet, Rfl: 0   atenolol (TENORMIN) 50 MG tablet, TAKE 1 TABLET(50 MG) BY MOUTH DAILY, Disp: 90 tablet, Rfl: 0   atorvastatin (LIPITOR) 10 MG tablet, TAKE 1 TABLET(10 MG) BY MOUTH DAILY, Disp: 90 tablet, Rfl: 3   Calcium Carbonate-Vitamin D (OYSTER SHELL CALCIUM 500 + D) 500-125 MG-UNIT TABS, Take 1 tablet by mouth daily. , Disp: , Rfl:    Cholecalciferol (VITAMIN D3) 10 MCG (400 UNIT) tablet, Take 400 Units by mouth daily., Disp: , Rfl:    Dulaglutide (TRULICITY) 3 0000000 SOPN, Inject 3 mg as directed once a week., Disp: 6 mL, Rfl: 4   furosemide (LASIX) 40 MG tablet, TAKE 1 TABLET(40 MG) BY MOUTH TWICE DAILY, Disp: 180 tablet, Rfl: 1   Insulin Glargine (BASAGLAR KWIKPEN) 100 UNIT/ML, Inject 52 Units into the skin at bedtime., Disp: , Rfl:    JARDIANCE 25 MG TABS tablet, TAKE 1 TABLET(25 MG) BY MOUTH DAILY, Disp: 90 tablet, Rfl: 0   levonorgestrel (MIRENA) 20 MCG/24HR IUD, 1 each by Intrauterine route once. , Disp: , Rfl:    losartan (COZAAR) 25 MG tablet, TAKE 1 TABLET(25 MG) BY MOUTH DAILY, Disp: 90 tablet, Rfl: 4   Blood Glucose Monitoring Suppl (CONTOUR  NEXT MONITOR) w/Device KIT, To check blood sugar 4 times daily., Disp: 1 kit, Rfl: 0   glucose blood (CONTOUR NEXT TEST) test strip, TEST BLOOD SUGAR ONCE DAILY, Disp: 100 each, Rfl: 2   Insulin Pen Needle 32G X 4 MM MISC, 1 Units by Does not apply route every morning. Pen needles, Disp: 90 each,  Rfl: 3   Lancets (ONETOUCH ULTRASOFT) lancets, Use as instructed, Disp: 100 each, Rfl: 12   lidocaine (LIDODERM) 5 %, Place 1 patch onto the skin daily. Remove & Discard patch within 12 hours or as directed by MD (Patient not taking: Reported on 04/29/2022), Disp: 30 patch, Rfl: 0   OXYGEN, Inhale 2 L into the lungs daily., Disp: , Rfl:    acetaminophen **OR** acetaminophen, albuterol, HYDROcodone-acetaminophen, HYDROmorphone (DILAUDID) injection, ondansetron **OR** ondansetron (ZOFRAN) IV   Objective   Vitals:   04/29/22 0636 04/29/22 0637 04/29/22 1130 04/29/22 1400  BP: 122/75  103/65   Pulse: 77  77   Resp: 18  18   Temp: 98.2 F (36.8 C)   98.6 F (37 C)  TempSrc: Oral   Oral  SpO2: 93%  94%   Weight:  (!) 164.5 kg    Height:  5\' 8"  (1.727 m)       Physical Exam Vitals and nursing note reviewed.  Constitutional:      General: She is not in acute distress.    Appearance: Normal appearance. She is well-developed. She is obese. She is not ill-appearing, toxic-appearing or diaphoretic.  HENT:     Head: Normocephalic and atraumatic.     Nose: Nose normal.     Mouth/Throat:     Mouth: Mucous membranes are moist.     Pharynx: Oropharynx is clear.  Eyes:     General: No scleral icterus.    Extraocular Movements: Extraocular movements intact.  Cardiovascular:     Rate and Rhythm: Normal rate and regular rhythm.     Heart sounds: Normal heart sounds. No murmur heard.    No friction rub. No gallop.  Pulmonary:     Effort: Pulmonary effort is normal. No respiratory distress.     Breath sounds: Normal breath sounds. No wheezing, rhonchi or rales.  Abdominal:     General: Bowel sounds  are normal. There is no distension.     Palpations: Abdomen is soft.     Tenderness: There is no abdominal tenderness (no tenderness to even deep palpation). There is no guarding or rebound.  Musculoskeletal:     Cervical back: Neck supple.     Right lower leg: No edema.     Left lower leg: No edema.     Comments: Patient notes discomfort rising from lying down to sitting position  Skin:    General: Skin is warm and dry.     Coloration: Skin is not jaundiced or pale.  Neurological:     General: No focal deficit present.     Mental Status: She is alert and oriented to person, place, and time. Mental status is at baseline.  Psychiatric:        Mood and Affect: Mood normal.        Behavior: Behavior normal.        Thought Content: Thought content normal.        Judgment: Judgment normal.     Laboratory Data Recent Labs  Lab 04/29/22 0638  WBC 8.5  HGB 12.5  HCT 41.6  PLT 331   Recent Labs  Lab 04/29/22 0638  NA 138  K 3.8  CL 98  CO2 29  BUN 18  CALCIUM 8.4*  PROT 7.8  BILITOT 1.0  ALKPHOS 88  ALT 14  AST 13*  GLUCOSE 108*   No results for input(s): "INR" in the last 168 hours.  Recent Labs    04/29/22 0638  LIPASE 31  Imaging Studies: MR ABDOMEN MRCP W WO CONTAST  Result Date: 04/29/2022 CLINICAL DATA:  Right upper quadrant pain for 2 weeks, worsening, history of cholecystectomy. Focal left lobe biliary ductal dilatation identified by prior CT EXAM: MRI ABDOMEN WITHOUT AND WITH CONTRAST (INCLUDING MRCP) TECHNIQUE: Multiplanar multisequence MR imaging of the abdomen was performed both before and after the administration of intravenous contrast. Heavily T2-weighted images of the biliary and pancreatic ducts were obtained, and three-dimensional MRCP images were rendered by post processing. CONTRAST:  74mL GADAVIST GADOBUTROL 1 MMOL/ML IV SOLN COMPARISON:  Same-day CT abdomen pelvis, 04/29/2022, CT abdomen pelvis, 05/16/2011 FINDINGS: Lower chest: No acute  abnormality. Hepatobiliary: As reported by prior CT, there is segmental biliary ductal dilatation of the left lobe of the liver, involving hepatic segments II and III. There is atrophy of the left lobe, this overall morphology similar to prior examination dated 2013, however biliary ductal dilatation is new. There is no directly visualized lesion or other abnormality within the central left lobe of the liver near the nidus of ductal dilatation (series 23, image 32). Status post cholecystectomy, with postoperative dilatation of the common bile duct up to 1.4 cm in caliber. No obstructing calculus or other lesion identified to the ampulla. No right-sided intrahepatic biliary ductal dilatation. Pancreas: Unremarkable. No pancreatic ductal dilatation or surrounding inflammatory changes. Spleen: Normal in size without significant abnormality. Adrenals/Urinary Tract: Adrenal glands are unremarkable. Multiple simple, benign bilateral renal cortical cysts, for which no further follow-up or characterization is required. Kidneys are otherwise normal, without renal calculi, solid lesion, or hydronephrosis. Stomach/Bowel: Stomach is within normal limits. No evidence of bowel wall thickening, distention, or inflammatory changes. Vascular/Lymphatic: No significant vascular findings are present. No enlarged abdominal lymph nodes. Other: No abdominal wall hernia or abnormality. No ascites. Musculoskeletal: No acute or significant osseous findings. IMPRESSION: 1. As reported by prior CT, there is segmental biliary ductal dilatation of the left lobe of the liver, involving hepatic segments II and III. Atrophy of the left lobe, this overall morphology similar to prior examination dated 05/16/2011, however biliary ductal dilatation is new. There is no directly visualized lesion or other abnormality within the central left lobe of the liver of the nidus of ductal dilatation. Differential considerations include stricture and very small  occult mass such as cholangiocarcinoma. Consider ERCP for further evaluation. 2. Status post cholecystectomy, with postoperative dilatation of the common bile duct up to 1.4 cm in caliber. No obstructing calculus or other lesion identified to the ampulla. No right-sided intrahepatic biliary ductal dilatation. Electronically Signed   By: Delanna Ahmadi M.D.   On: 04/29/2022 11:32   MR 3D Recon At Scanner  Result Date: 04/29/2022 CLINICAL DATA:  Right upper quadrant pain for 2 weeks, worsening, history of cholecystectomy. Focal left lobe biliary ductal dilatation identified by prior CT EXAM: MRI ABDOMEN WITHOUT AND WITH CONTRAST (INCLUDING MRCP) TECHNIQUE: Multiplanar multisequence MR imaging of the abdomen was performed both before and after the administration of intravenous contrast. Heavily T2-weighted images of the biliary and pancreatic ducts were obtained, and three-dimensional MRCP images were rendered by post processing. CONTRAST:  86mL GADAVIST GADOBUTROL 1 MMOL/ML IV SOLN COMPARISON:  Same-day CT abdomen pelvis, 04/29/2022, CT abdomen pelvis, 05/16/2011 FINDINGS: Lower chest: No acute abnormality. Hepatobiliary: As reported by prior CT, there is segmental biliary ductal dilatation of the left lobe of the liver, involving hepatic segments II and III. There is atrophy of the left lobe, this overall morphology similar to prior examination dated 2013, however biliary ductal  dilatation is new. There is no directly visualized lesion or other abnormality within the central left lobe of the liver near the nidus of ductal dilatation (series 23, image 32). Status post cholecystectomy, with postoperative dilatation of the common bile duct up to 1.4 cm in caliber. No obstructing calculus or other lesion identified to the ampulla. No right-sided intrahepatic biliary ductal dilatation. Pancreas: Unremarkable. No pancreatic ductal dilatation or surrounding inflammatory changes. Spleen: Normal in size without significant  abnormality. Adrenals/Urinary Tract: Adrenal glands are unremarkable. Multiple simple, benign bilateral renal cortical cysts, for which no further follow-up or characterization is required. Kidneys are otherwise normal, without renal calculi, solid lesion, or hydronephrosis. Stomach/Bowel: Stomach is within normal limits. No evidence of bowel wall thickening, distention, or inflammatory changes. Vascular/Lymphatic: No significant vascular findings are present. No enlarged abdominal lymph nodes. Other: No abdominal wall hernia or abnormality. No ascites. Musculoskeletal: No acute or significant osseous findings. IMPRESSION: 1. As reported by prior CT, there is segmental biliary ductal dilatation of the left lobe of the liver, involving hepatic segments II and III. Atrophy of the left lobe, this overall morphology similar to prior examination dated 05/16/2011, however biliary ductal dilatation is new. There is no directly visualized lesion or other abnormality within the central left lobe of the liver of the nidus of ductal dilatation. Differential considerations include stricture and very small occult mass such as cholangiocarcinoma. Consider ERCP for further evaluation. 2. Status post cholecystectomy, with postoperative dilatation of the common bile duct up to 1.4 cm in caliber. No obstructing calculus or other lesion identified to the ampulla. No right-sided intrahepatic biliary ductal dilatation. Electronically Signed   By: Delanna Ahmadi M.D.   On: 04/29/2022 11:32   CT ABDOMEN PELVIS W CONTRAST  Result Date: 04/29/2022 CLINICAL DATA:  2 week history of right upper quadrant pain with progressive worsening. EXAM: CT ABDOMEN AND PELVIS WITH CONTRAST TECHNIQUE: Multidetector CT imaging of the abdomen and pelvis was performed using the standard protocol following bolus administration of intravenous contrast. RADIATION DOSE REDUCTION: This exam was performed according to the departmental dose-optimization program  which includes automated exposure control, adjustment of the mA and/or kV according to patient size and/or use of iterative reconstruction technique. CONTRAST:  157mL OMNIPAQUE IOHEXOL 350 MG/ML SOLN COMPARISON:  05/16/2011 FINDINGS: Lower chest: Subsegmental atelectasis noted dependent left lung base. Hepatobiliary: Intrahepatic biliary dilatation noted lateral segment left liver, new since most recent comparison study of chest CT 06/08/2012. No suspicious focal abnormality in the liver on this study without intravenous contrast. Cholecystectomy. No common bile duct dilatation. Pancreas: No focal mass lesion. No dilatation of the main duct. No intraparenchymal cyst. No peripancreatic edema. Spleen: No splenomegaly. No focal mass lesion. Adrenals/Urinary Tract: No adrenal nodule or mass. 2.3 cm well-defined homogeneous water density lesion interpolar right kidney is compatible with a cyst. No followup imaging is recommended. Left kidney unremarkable. No evidence for hydroureter. The urinary bladder appears normal for the degree of distention. Stomach/Bowel: Stomach is unremarkable. No gastric wall thickening. No evidence of outlet obstruction. Duodenum is normally positioned as is the ligament of Treitz. No small bowel wall thickening. No small bowel dilatation. The terminal ileum is normal. The appendix is normal. No gross colonic mass. No colonic wall thickening. Diverticular changes are noted in the left colon without evidence of diverticulitis. Vascular/Lymphatic: No abdominal aortic aneurysm. There is no gastrohepatic or hepatoduodenal ligament lymphadenopathy. No retroperitoneal or mesenteric lymphadenopathy. No pelvic sidewall lymphadenopathy. Reproductive: IUD is visualized in the uterus. IUD placement appears  somewhat low on sagittal imaging with the inferior stem probably in the region of the internal cervical os. There is no adnexal mass. Other: No intraperitoneal free fluid. Musculoskeletal: No worrisome  lytic or sclerotic osseous abnormality. IMPRESSION: 1. Focal intrahepatic biliary dilatation noted in the lateral segment left liver, new since most recent comparison study of 06/08/2012. No dilatation of the right hepatic ducts or evidence of common bile duct dilatation. Correlation with liver function test recommended. MRI abdomen with and without contrast recommended to further evaluate. 2. IUD placement appears somewhat low on sagittal imaging with the inferior tip of the stem probably in the region of the internal cervical os. 3. Left colonic diverticulosis without diverticulitis. Electronically Signed   By: Misty Stanley M.D.   On: 04/29/2022 08:55    Assessment:   # Abnormal imaging-intrahepatic and CBD dilation; left hepatic lobe atrophy -No signs of obstruction at the ampullary level or choledocholithiasis -LFTs within normal limits -No appetite changes or weight loss -CBD measuring 14 mm in postcholecystectomy state - stomach/bowel, pancreatic all wnl  # Right sided abdominal/side pain -No obstructive pattern of LFTs or obstructive findings on imaging -Not conclusively GI in etiology.  Seems more musculoskeletal related given recent bout of coughing and obesity -Worsened with movement which goes against it being GI in etiology at this time -On exam no tenderness to deep palpation  # Morbid obesity  # diverticulosis  # HF EF 55-60% on last check  # CKD # OSA c CPAP use  Plan:  -Abdominal pain is not convincingly GI in etiology at this time -LFTs are within normal limits and demonstrate no obstructive pattern -No obstructive findings on imaging  -Patient does warrant further workup for biliary duct changes in the outpatient setting -Have discussed with local ERCP provider who agrees to outpatient ERCP and will have their office call the patient to arrange  -Supportive care including pain control and antiemetics as per primary team -Patient may also warrants endoscopic  ultrasound at a later date -Will collect AMA serum lab  -GI will follow peripherally regarding LFTs.  If stark increase in these labs, will consider change in plan to more inpatient setting  I personally performed the service.  Management of other medical comorbidities as per primary team  Thank you for allowing [place] to participate in this patient's care. Please don't hesitate to call if any questions or concerns arise.   Annamaria Helling, DO [facility]  Portions of the record may have been created with voice recognition software. Occasional wrong-word or 'sound-a-like' substitutions may have occurred due to the inherent limitations of voice recognition software.  Read the chart carefully and recognize, using context, where substitutions may have occurred.

## 2022-04-29 NOTE — Assessment & Plan Note (Addendum)
CT and MRCP obtained today with evidence of intrahepatic and common bile duct dilation.  Current pain is not completely consistent with biliary etiology, however patient will require workup of her biliary dilation.  - GI consulted; appreciate their recommendations - EUS and ERCP timing per GI - Repeat CMP in the a.m.

## 2022-04-29 NOTE — Assessment & Plan Note (Addendum)
Patient has a history of hypertension on multiple antihypertensives.  Blood pressure today is on the lower end of normal, likely due to IV opioid use for pain control.  She denies any symptoms including dizziness, chest pain or shortness of breath.  - Will hold her home antihypertensives today and monitor closely.  Will restart as able

## 2022-04-29 NOTE — Assessment & Plan Note (Signed)
Patient presenting with 2 to 3-week history of intermittent right upper quadrant abdominal pain with evidence of biliary dilation on CT and MRCP.  Pain is reproducible with certain position changes which is not quite consistent with biliary etiology.  LFTs are within normal limits.  - GI consulted; appreciate their recommendations - Pain control with trial of Robaxin and Dilaudid - Continuous pulse oximetry while receiving opioids given chronic hypoxic respiratory failure

## 2022-04-29 NOTE — ED Triage Notes (Addendum)
Pt presents ambulatory to triage via POV with complaints of RUQ pain that started 2 weeks ago but has gotten progressively worse. Pt states she was seen by her PCP who stated it was muscle spasms but she states this is different. No meds taken PTA.  A&Ox4 at this time. Denies CP or SOB.

## 2022-04-29 NOTE — Assessment & Plan Note (Signed)
-   CPAP at bedtime 

## 2022-04-29 NOTE — Assessment & Plan Note (Signed)
Renal function currently at baseline.  Will continue to monitor while admitted. 

## 2022-04-29 NOTE — Assessment & Plan Note (Addendum)
-   Hold home antiglycemic agents with exception of Jardiance given that is for her HFrEF - SSI, moderate - Semglee 20 units at bedtime

## 2022-04-29 NOTE — Progress Notes (Signed)
PHARMACIST - PHYSICIAN COMMUNICATION  CONCERNING:  Enoxaparin (Lovenox) for DVT Prophylaxis    RECOMMENDATION: Patient was prescribed enoxaprin 40mg  q24 hours for VTE prophylaxis.   Filed Weights   04/29/22 0637  Weight: (!) 164.5 kg (362 lb 10.5 oz)    Body mass index is 55.14 kg/m.  Estimated Creatinine Clearance: 109.1 mL/min (by C-G formula based on SCr of 0.98 mg/dL).   Based on [place] patient is candidate for enoxaparin 0.5mg /kg TBW SQ every 24 hours based on BMI being >30.  Patient is candidate for enoxaparin 30mg  every 24 hours based on CrCl <81ml/min or Weight <45kg  DESCRIPTION: Pharmacy has adjusted enoxaparin dose per [facility] policy.  Patient is now receiving enoxaparin 82.5 mg every 24 hours    Darrick Penna, PharmD Clinical Pharmacist  04/29/2022 1:30 PM

## 2022-04-29 NOTE — ED Provider Notes (Signed)
[facility] Provider Note    Event Date/Time   First MD Initiated Contact with Patient 04/29/22 [phone]     (approximate)   History   Abdominal Pain   HPI  IFFANY MCADAM is a 52 y.o. female with a history of heart failure, hypertension, hyperlipidemia, OSA on CPAP, and type 2 diabetes who presents with right upper quadrant pain for the last 2 weeks, acutely worsened over the last day, radiating down from the right side towards the right abdomen and not associated with any nausea or vomiting, fever or chills, or any diarrhea.  The patient denies urinary symptoms.  She denies any chest pain.  There is no association between the pain and eating or any particular time of day.  I reviewed the past medical records.  The patient was most recently seen by NP Cannady from family practice on 3/7 for right-sided pain although at that time it was described as being in the right thorax/shoulder area and right lower abdomen.  She was prescribed tizanidine and lidocaine patches as well as Flagyl for presumed BV after a wet prep showed clue cells.  The patient is status post cholecystectomy more than 10 years ago.   Physical Exam   Triage Vital Signs: ED Triage Vitals  Enc Vitals Group     BP 04/29/22 0636 122/75     Pulse Rate 04/29/22 0636 77     Resp 04/29/22 0636 18     Temp 04/29/22 0636 98.2 F (36.8 C)     Temp Source 04/29/22 0636 Oral     SpO2 04/29/22 0636 93 %     Weight 04/29/22 0637 (!) 362 lb 10.5 oz (164.5 kg)     Height 04/29/22 0637 5\' 8"  (1.727 m)     Head Circumference --      Peak Flow --      Pain Score 04/29/22 0636 8     Pain Loc --      Pain Edu? --      Excl. in [place]? --     Most recent vital signs: Vitals:   04/29/22 0636 04/29/22 1130  BP: 122/75 103/65  Pulse: 77 77  Resp: 18 18  Temp: 98.2 F (36.8 C)   SpO2: 93% 94%     General: Alert and oriented, uncomfortable appearing but in no acute distress. CV:  Good peripheral  perfusion.  Resp:  Normal effort.  Abd:  Upper quadrant tenderness.  No distention.  Other:  No midline spinal tenderness or right paraspinal tenderness.  No jaundice or scleral icterus.   ED Results / Procedures / Treatments   Labs (all labs ordered are listed, but only abnormal results are displayed) Labs Reviewed  COMPREHENSIVE METABOLIC PANEL - Abnormal; Notable for the following components:      Result Value   Glucose, Bld 108 (*)    Calcium 8.4 (*)    Albumin 3.4 (*)    AST 13 (*)    All other components within normal limits  LIPASE, BLOOD  CBC  URINALYSIS, ROUTINE W REFLEX MICROSCOPIC     EKG  ED ECG REPORT I, Arta Silence, the attending physician, personally viewed and interpreted this ECG.  Date: 04/29/2022 EKG Time: 0642 Rate: 79 Rhythm: normal sinus rhythm QRS Axis: normal Intervals: normal ST/T Wave abnormalities: normal Narrative Interpretation: no evidence of acute ischemia    RADIOLOGY  CT abdomen/pelvis: I independently viewed and interpreted the images; there are no dilated bowel loops or any free  air or free fluid.  Radiology report indicates intrahepatic biliary dilatation.  MRCP:  1. As reported by prior CT, there is segmental biliary ductal  dilatation of the left lobe of the liver, involving hepatic segments  II and III. Atrophy of the left lobe, this overall morphology  similar to prior examination dated 05/16/2011, however biliary  ductal dilatation is new. There is no directly visualized lesion or  other abnormality within the central left lobe of the liver of the  nidus of ductal dilatation. Differential considerations include  stricture and very small occult mass such as cholangiocarcinoma.  Consider ERCP for further evaluation.  2. Status post cholecystectomy, with postoperative dilatation of the  common bile duct up to 1.4 cm in caliber. No obstructing calculus or  other lesion identified to the ampulla. No right-sided  intrahepatic  biliary ductal dilatation.      PROCEDURES:  Critical Care performed: No  Procedures   MEDICATIONS ORDERED IN ED: Medications  ondansetron (ZOFRAN) injection 4 mg (4 mg Intravenous Given 04/29/22 0803)  morphine (PF) 4 MG/ML injection 4 mg (4 mg Intravenous Given 04/29/22 0802)  iohexol (OMNIPAQUE) 350 MG/ML injection 100 mL (100 mLs Intravenous Contrast Given 04/29/22 0827)  morphine (PF) 4 MG/ML injection 4 mg (4 mg Intravenous Given 04/29/22 0936)  gadobutrol (GADAVIST) 1 MMOL/ML injection 10 mL (10 mLs Intravenous Contrast Given 04/29/22 1104)     IMPRESSION / MDM / ASSESSMENT AND PLAN / ED COURSE  I reviewed the triage vital signs and the nursing notes.  52 year old female with PMH as noted above presents with right upper quadrant abdominal pain which she states has been present for the last 2 weeks but acutely worsened over the last day and with no significant associated symptoms.  On exam the patient is somewhat uncomfortable appearing and is tender in this area but her vital signs are normal.  Differential diagnosis includes, but is not limited to, choledocholithiasis, cholangitis, ureteral stone, pyelonephritis, colitis, diverticulitis, muscle strain or spasm.  Given the location and duration of the pain, the localized tenderness on exam, and the normal vital signs there is no evidence of renal infarct or other acute vascular etiology.  We will obtain CT ab/pelvis, lab workup, and reassess.  Patient's presentation is most consistent with acute presentation with potential threat to life or bodily function.  The patient is on the cardiac monitor to evaluate for evidence of arrhythmia and/or significant heart rate changes.  ----------------------------------------- 12:28 PM on 04/29/2022 -----------------------------------------  Workup is unremarkable.  There is no leukocytosis.  LFTs, alk phos, bilirubin, and lipase are all normal.  CT showed intrahepatic  biliary dilatation.  I obtained an MRCP which confirmed this finding.  I consulted Dr. Virgina Jock from GI and the case with him.  He agrees to evaluate the patient.  The patient may need an ERCP for further evaluation.  Her pain is improved but she is still having relatively significant pain.  I then consulted Dr. Charleen Kirks from the hospitalist service for admission.   FINAL CLINICAL IMPRESSION(S) / ED DIAGNOSES   Final diagnoses:  Right upper quadrant abdominal pain  Dilation of biliary tract     Rx / DC Orders   ED Discharge Orders     None        Note:  This document was prepared using Dragon voice recognition software and may include unintentional dictation errors.    Arta Silence, MD 04/29/22 1230

## 2022-04-29 NOTE — H&P (Addendum)
History and Physical    Patient: Sara Zhang [id number] DOB: 06/04/1970 DOA: 04/29/2022 DOS: the patient was seen and examined on 04/29/2022 PCP: Venita Lick, NP  Patient coming from: Home  Chief Complaint:  Chief Complaint  Patient presents with   Abdominal Pain   HPI: Sara Zhang is a 52 y.o. female with medical history significant of HFrEF with recovered EF (last 55-60%), chronic hypoxic respiratory failure on 2 L at home, OSA on CPAP, hypertension, hyperlipidemia, type 2 diabetes, CKD stage II, who presents to the ED with complaints of right upper quadrant pain.  [name] states that approximately 2 to 3 weeks ago, she had a viral illness that caused severe cough.  After the cough, she noted right flank pain and thought it may be from the cough.  She saw her PCP who suspected a muscle strain.  She notes that the right upper quadrant and flank pain has persisted even though she is no longer coughing.  The pain is intermittent and she has not been able to identify any triggering factors.  She notes that certain movements and positional changes does make the pain worse.  She denies any fever, chills, nausea, vomiting, diarrhea, unintentional weight loss, night sweats.  ED course: On arrival to the ED, patient was normotensive at 122/75 with heart rate of 77.  She was saturating at 93% on her home 2 L.  She was afebrile at 98.2.  Initial workup notable for normal CBC, and CMP with AST of 13, ALT 14, alkaline phosphatase 88, total bilirubin 1.0 and GFR above 60.  CT of the abdomen was obtained that demonstrated focal intrahepatic biliary dilation in the lateral left segment.  MRCP was subsequently obtained that demonstrated segmental biliary ductal dilation of the left lobe of the liver in addition to common bile duct dilation up to 1.4 cm.  No calculus or obstructing lesion noted.  GI consulted.  TR[facility] contacted for admission due to intractable pain.  Review of Systems: As  mentioned in the history of present illness. All other systems reviewed and are negative.  Past Medical History:  Diagnosis Date   Abnormal uterine bleeding (AUB) 11/25/2016   Chronic diastolic CHF (congestive heart failure) ([place])    a. 02/2012 Echo: EF 25-35%, glob HK; b. 06/2016 Echo: EF 55-60%, mild MR, nl PASP.   CKD (chronic kidney disease), stage II    COVID-19    Diabetes mellitus without complication ([place])    Extreme obesity    Hyperlipidemia    Hypertension    Hypoxia    Microcytic anemia    Pneumonia    Pneumonia    Sleep apnea    Status post endometrial ablation 06/20/2016   2015 hysteroscopy/D&C with NovaSure endometrial ablation; pathology-benign endometrial polyps   Thickened endometrium 06/20/2016   Ultrasound-22.3 mm endometrial stripe, heterogenous   Vaginal bleeding    a. 01/2017 s/p hyteroscopy and polypectomy [facility]).   Vitamin D deficiency    Past Surgical History:  Procedure Laterality Date   CESAREAN SECTION     CHOLECYSTECTOMY     DILATION AND CURETTAGE, DIAGNOSTIC / THERAPEUTIC     TONSILLECTOMY AND ADENOIDECTOMY     TOOTH EXTRACTION  01/2016   Social History:  reports that she has never smoked. She has never used smokeless tobacco. She reports current alcohol use. She reports that she does not use drugs.  No Known Allergies  Family History  Problem Relation Age of Onset   Diabetes Mother  Hyperlipidemia Mother    Hypertension Mother    Stroke Mother    Arthritis Father    Asthma Father    Diabetes Father    Hypertension Father    Hypertension Brother    Diabetes Brother    Diabetes Maternal Grandmother    Hypertension Maternal Grandmother    Diabetes Maternal Grandfather    Hypertension Maternal Grandfather    Diabetes Paternal Grandmother    Hypertension Paternal Grandmother    Heart attack Maternal Uncle    Breast cancer Neg Hx     Prior to Admission medications   Medication Sig Start Date End Date Taking? Authorizing Provider   albuterol (VENTOLIN HFA) 108 (90 Base) MCG/ACT inhaler INHALE 2 PUFFS INTO THE LUNGS EVERY 6 HOURS AS NEEDED FOR WHEEZING OR SHORTNESS OF BREATH 04/01/22   Cannady, Jolene T, NP  amLODipine (NORVASC) 5 MG tablet TAKE 1 TABLET(5 MG) BY MOUTH DAILY 02/18/22   End, Harrell Gave, MD  atenolol (TENORMIN) 50 MG tablet TAKE 1 TABLET(50 MG) BY MOUTH DAILY 02/18/22   Cannady, Jolene T, NP  atorvastatin (LIPITOR) 10 MG tablet TAKE 1 TABLET(10 MG) BY MOUTH DAILY 02/18/22   Cannady, Jolene T, NP  Blood Glucose Monitoring Suppl (CONTOUR NEXT MONITOR) w/Device KIT To check blood sugar 4 times daily. 12/22/19   Cannady, Henrine Screws T, NP  Calcium Carbonate-Vitamin D (OYSTER SHELL CALCIUM 500 + D) 500-125 MG-UNIT TABS Take 1 tablet by mouth daily.     [provider]  Cholecalciferol (VITAMIN D3) 10 MCG (400 UNIT) tablet Take 400 Units by mouth daily.    [provider]  Dulaglutide (TRULICITY) 3 0000000 SOPN Inject 3 mg as directed once a week. 01/21/22   Cannady, Henrine Screws T, NP  furosemide (LASIX) 40 MG tablet TAKE 1 TABLET(40 MG) BY MOUTH TWICE DAILY 02/19/22   End, Harrell Gave, MD  glucose blood (CONTOUR NEXT TEST) test strip TEST BLOOD SUGAR ONCE DAILY 01/09/18   Cannady, Henrine Screws T, NP  Insulin Glargine (BASAGLAR KWIKPEN) 100 UNIT/ML Inject 52 Units into the skin at bedtime. 07/30/21   [provider]  Insulin Pen Needle 32G X 4 MM MISC 1 Units by Does not apply route every morning. Pen needles 08/21/16   Kathrine Haddock, NP  JARDIANCE 25 MG TABS tablet TAKE 1 TABLET(25 MG) BY MOUTH DAILY 08/23/21   Marnee Guarneri T, NP  Lancets (ONETOUCH ULTRASOFT) lancets Use as instructed 06/05/20   Marnee Guarneri T, NP  levonorgestrel (MIRENA) 20 MCG/24HR IUD 1 each by Intrauterine route once.     [provider]  lidocaine (LIDODERM) 5 % Place 1 patch onto the skin daily. Remove & Discard patch within 12 hours or as directed by MD 04/11/22   Marnee Guarneri T, NP  losartan (COZAAR) 25 MG tablet TAKE 1  TABLET(25 MG) BY MOUTH DAILY 11/20/21   Cannady, Henrine Screws T, NP  OXYGEN Inhale 2 L into the lungs daily.    [provider]  tiZANidine (ZANAFLEX) 4 MG tablet Take 1 tablet (4 mg total) by mouth every 6 (six) hours as needed for muscle spasms. 04/11/22   Marnee Guarneri T, NP    Physical Exam: Vitals:   04/29/22 1400 04/29/22 1415 04/29/22 1430 04/29/22 1500  BP:  (!) 93/57    Pulse:  84 81 77  Resp:      Temp: 98.6 F (37 C)     TempSrc: Oral     SpO2:  95% 93% 97%  Weight:      Height:  Physical Exam Vitals and nursing note reviewed.  Constitutional:      Appearance: She is morbidly obese. She is not ill-appearing.  HENT:     Head: Normocephalic and atraumatic.     Mouth/Throat:     Mouth: Mucous membranes are moist.     Pharynx: Oropharynx is clear.  Eyes:     Conjunctiva/sclera: Conjunctivae normal.     Pupils: Pupils are equal, round, and reactive to light.  Cardiovascular:     Rate and Rhythm: Normal rate and regular rhythm.     Heart sounds: No murmur heard. Pulmonary:     Effort: Pulmonary effort is normal. No respiratory distress.  Abdominal:     General: Bowel sounds are normal.     Palpations: Abdomen is soft.     Tenderness: There is no abdominal tenderness. There is no right CVA tenderness, left CVA tenderness or guarding.  Musculoskeletal:     Right lower leg: No edema.     Left lower leg: No edema.  Skin:    General: Skin is warm and dry.  Neurological:     General: No focal deficit present.     Mental Status: She is alert and oriented to person, place, and time. Mental status is at baseline.  Psychiatric:        Mood and Affect: Mood normal.        Behavior: Behavior normal.    Data Reviewed: CBC with WBC of 8.5, hemoglobin 12.5, platelets 331 CMP with sodium of 138, potassium 3.8, chloride 98, bicarb 29, glucose 108, creatinine 0.90, calcium 8.4, alkaline phosphatase 88, AST 13, ALT 14 and GFR above 60 Lipase within normal limits at  31  EKG personally reviewed.  Sinus rhythm with rate of 79.  First-degree AV block noted.  No ST or T wave changes concerning for acute ischemia.  MR ABDOMEN MRCP W WO CONTAST  Result Date: 04/29/2022 CLINICAL DATA:  Right upper quadrant pain for 2 weeks, worsening, history of cholecystectomy. Focal left lobe biliary ductal dilatation identified by prior CT EXAM: MRI ABDOMEN WITHOUT AND WITH CONTRAST (INCLUDING MRCP) TECHNIQUE: Multiplanar multisequence MR imaging of the abdomen was performed both before and after the administration of intravenous contrast. Heavily T2-weighted images of the biliary and pancreatic ducts were obtained, and three-dimensional MRCP images were rendered by post processing. CONTRAST:  1mL GADAVIST GADOBUTROL 1 MMOL/ML IV SOLN COMPARISON:  Same-day CT abdomen pelvis, 04/29/2022, CT abdomen pelvis, 05/16/2011 FINDINGS: Lower chest: No acute abnormality. Hepatobiliary: As reported by prior CT, there is segmental biliary ductal dilatation of the left lobe of the liver, involving hepatic segments II and III. There is atrophy of the left lobe, this overall morphology similar to prior examination dated 2013, however biliary ductal dilatation is new. There is no directly visualized lesion or other abnormality within the central left lobe of the liver near the nidus of ductal dilatation (series 23, image 32). Status post cholecystectomy, with postoperative dilatation of the common bile duct up to 1.4 cm in caliber. No obstructing calculus or other lesion identified to the ampulla. No right-sided intrahepatic biliary ductal dilatation. Pancreas: Unremarkable. No pancreatic ductal dilatation or surrounding inflammatory changes. Spleen: Normal in size without significant abnormality. Adrenals/Urinary Tract: Adrenal glands are unremarkable. Multiple simple, benign bilateral renal cortical cysts, for which no further follow-up or characterization is required. Kidneys are otherwise normal, without  renal calculi, solid lesion, or hydronephrosis. Stomach/Bowel: Stomach is within normal limits. No evidence of bowel wall thickening, distention, or inflammatory changes. Vascular/Lymphatic:  No significant vascular findings are present. No enlarged abdominal lymph nodes. Other: No abdominal wall hernia or abnormality. No ascites. Musculoskeletal: No acute or significant osseous findings. IMPRESSION: 1. As reported by prior CT, there is segmental biliary ductal dilatation of the left lobe of the liver, involving hepatic segments II and III. Atrophy of the left lobe, this overall morphology similar to prior examination dated 05/16/2011, however biliary ductal dilatation is new. There is no directly visualized lesion or other abnormality within the central left lobe of the liver of the nidus of ductal dilatation. Differential considerations include stricture and very small occult mass such as cholangiocarcinoma. Consider ERCP for further evaluation. 2. Status post cholecystectomy, with postoperative dilatation of the common bile duct up to 1.4 cm in caliber. No obstructing calculus or other lesion identified to the ampulla. No right-sided intrahepatic biliary ductal dilatation. Electronically Signed   By: Delanna Ahmadi M.D.   On: 04/29/2022 11:32   MR 3D Recon At Scanner  Result Date: 04/29/2022 CLINICAL DATA:  Right upper quadrant pain for 2 weeks, worsening, history of cholecystectomy. Focal left lobe biliary ductal dilatation identified by prior CT EXAM: MRI ABDOMEN WITHOUT AND WITH CONTRAST (INCLUDING MRCP) TECHNIQUE: Multiplanar multisequence MR imaging of the abdomen was performed both before and after the administration of intravenous contrast. Heavily T2-weighted images of the biliary and pancreatic ducts were obtained, and three-dimensional MRCP images were rendered by post processing. CONTRAST:  55mL GADAVIST GADOBUTROL 1 MMOL/ML IV SOLN COMPARISON:  Same-day CT abdomen pelvis, 04/29/2022, CT abdomen  pelvis, 05/16/2011 FINDINGS: Lower chest: No acute abnormality. Hepatobiliary: As reported by prior CT, there is segmental biliary ductal dilatation of the left lobe of the liver, involving hepatic segments II and III. There is atrophy of the left lobe, this overall morphology similar to prior examination dated 2013, however biliary ductal dilatation is new. There is no directly visualized lesion or other abnormality within the central left lobe of the liver near the nidus of ductal dilatation (series 23, image 32). Status post cholecystectomy, with postoperative dilatation of the common bile duct up to 1.4 cm in caliber. No obstructing calculus or other lesion identified to the ampulla. No right-sided intrahepatic biliary ductal dilatation. Pancreas: Unremarkable. No pancreatic ductal dilatation or surrounding inflammatory changes. Spleen: Normal in size without significant abnormality. Adrenals/Urinary Tract: Adrenal glands are unremarkable. Multiple simple, benign bilateral renal cortical cysts, for which no further follow-up or characterization is required. Kidneys are otherwise normal, without renal calculi, solid lesion, or hydronephrosis. Stomach/Bowel: Stomach is within normal limits. No evidence of bowel wall thickening, distention, or inflammatory changes. Vascular/Lymphatic: No significant vascular findings are present. No enlarged abdominal lymph nodes. Other: No abdominal wall hernia or abnormality. No ascites. Musculoskeletal: No acute or significant osseous findings. IMPRESSION: 1. As reported by prior CT, there is segmental biliary ductal dilatation of the left lobe of the liver, involving hepatic segments II and III. Atrophy of the left lobe, this overall morphology similar to prior examination dated 05/16/2011, however biliary ductal dilatation is new. There is no directly visualized lesion or other abnormality within the central left lobe of the liver of the nidus of ductal dilatation.  Differential considerations include stricture and very small occult mass such as cholangiocarcinoma. Consider ERCP for further evaluation. 2. Status post cholecystectomy, with postoperative dilatation of the common bile duct up to 1.4 cm in caliber. No obstructing calculus or other lesion identified to the ampulla. No right-sided intrahepatic biliary ductal dilatation. Electronically Signed   By: Lanae Crumbly  Laqueta Carina M.D.   On: 04/29/2022 11:32   CT ABDOMEN PELVIS W CONTRAST  Result Date: 04/29/2022 CLINICAL DATA:  2 week history of right upper quadrant pain with progressive worsening. EXAM: CT ABDOMEN AND PELVIS WITH CONTRAST TECHNIQUE: Multidetector CT imaging of the abdomen and pelvis was performed using the standard protocol following bolus administration of intravenous contrast. RADIATION DOSE REDUCTION: This exam was performed according to the departmental dose-optimization program which includes automated exposure control, adjustment of the mA and/or kV according to patient size and/or use of iterative reconstruction technique. CONTRAST:  168mL OMNIPAQUE IOHEXOL 350 MG/ML SOLN COMPARISON:  05/16/2011 FINDINGS: Lower chest: Subsegmental atelectasis noted dependent left lung base. Hepatobiliary: Intrahepatic biliary dilatation noted lateral segment left liver, new since most recent comparison study of chest CT 06/08/2012. No suspicious focal abnormality in the liver on this study without intravenous contrast. Cholecystectomy. No common bile duct dilatation. Pancreas: No focal mass lesion. No dilatation of the main duct. No intraparenchymal cyst. No peripancreatic edema. Spleen: No splenomegaly. No focal mass lesion. Adrenals/Urinary Tract: No adrenal nodule or mass. 2.3 cm well-defined homogeneous water density lesion interpolar right kidney is compatible with a cyst. No followup imaging is recommended. Left kidney unremarkable. No evidence for hydroureter. The urinary bladder appears normal for the degree of  distention. Stomach/Bowel: Stomach is unremarkable. No gastric wall thickening. No evidence of outlet obstruction. Duodenum is normally positioned as is the ligament of Treitz. No small bowel wall thickening. No small bowel dilatation. The terminal ileum is normal. The appendix is normal. No gross colonic mass. No colonic wall thickening. Diverticular changes are noted in the left colon without evidence of diverticulitis. Vascular/Lymphatic: No abdominal aortic aneurysm. There is no gastrohepatic or hepatoduodenal ligament lymphadenopathy. No retroperitoneal or mesenteric lymphadenopathy. No pelvic sidewall lymphadenopathy. Reproductive: IUD is visualized in the uterus. IUD placement appears somewhat low on sagittal imaging with the inferior stem probably in the region of the internal cervical os. There is no adnexal mass. Other: No intraperitoneal free fluid. Musculoskeletal: No worrisome lytic or sclerotic osseous abnormality. IMPRESSION: 1. Focal intrahepatic biliary dilatation noted in the lateral segment left liver, new since most recent comparison study of 06/08/2012. No dilatation of the right hepatic ducts or evidence of common bile duct dilatation. Correlation with liver function test recommended. MRI abdomen with and without contrast recommended to further evaluate. 2. IUD placement appears somewhat low on sagittal imaging with the inferior tip of the stem probably in the region of the internal cervical os. 3. Left colonic diverticulosis without diverticulitis. Electronically Signed   By: Misty Stanley M.D.   On: 04/29/2022 08:55    There are no new results to review at this time.  Assessment and Plan: * RUQ abdominal pain Patient presenting with 2 to 3-week history of intermittent right upper quadrant abdominal pain with evidence of biliary dilation on CT and MRCP.  Pain is reproducible with certain position changes which is not quite consistent with biliary etiology.  LFTs are within normal  limits.  - GI consulted; appreciate their recommendations - Pain control with trial of Robaxin and Dilaudid - Continuous pulse oximetry while receiving opioids given chronic hypoxic respiratory failure  Dilation of biliary tract CT and MRCP obtained today with evidence of intrahepatic and common bile duct dilation.  Current pain is not completely consistent with biliary etiology, however patient will require workup of her biliary dilation.  - GI consulted; appreciate their recommendations - EUS and ERCP timing per GI - Repeat CMP in the a.m.  Essential hypertension Patient has a history of hypertension on multiple antihypertensives.  Blood pressure today is on the lower end of normal, likely due to IV opioid use for pain control.  She denies any symptoms including dizziness, chest pain or shortness of breath.  - Will hold her home antihypertensives today and monitor closely.  Will restart as able  CKD (chronic kidney disease) stage 3, GFR 30-59 ml/min (HCC) Renal function currently at baseline.  Will continue to monitor while admitted.  OSA on CPAP - CPAP at bedtime  Chronic heart failure with preserved ejection fraction (HCC) On examination, patient appears euvolemic.  - Continue home GDMT and Lasix - Continue home supplemental oxygen to maintain oxygen saturation above 88%  Type 2 diabetes mellitus treated with insulin ([place]) - Hold home antiglycemic agents with exception of Jardiance given that is for her HFrEF - SSI, moderate - Semglee 20 units at bedtime   Advance Care Planning:   Code Status: Full Code verified by patient  Consults: GI  Family Communication: Patient's fianc updated bedside  Severity of Illness: The appropriate patient status for this patient is OBSERVATION. Observation status is judged to be reasonable and necessary in order to provide the required intensity of service to ensure the patient's safety. The patient's presenting symptoms, physical exam  findings, and initial radiographic and laboratory data in the context of their medical condition is felt to place them at decreased risk for further clinical deterioration. Furthermore, it is anticipated that the patient will be medically stable for discharge from the hospital within 2 midnights of admission.   Author: Jose Persia, MD 04/29/2022 3:22 PM  For on call review [email or web address].

## 2022-04-29 NOTE — Assessment & Plan Note (Signed)
On examination, patient appears euvolemic.  - Continue home GDMT and Lasix - Continue home supplemental oxygen to maintain oxygen saturation above 88%

## 2022-04-30 ENCOUNTER — Telehealth: Payer: Self-pay

## 2022-04-30 DIAGNOSIS — I10 Essential (primary) hypertension: Secondary | ICD-10-CM | POA: Diagnosis not present

## 2022-04-30 DIAGNOSIS — K838 Other specified diseases of biliary tract: Secondary | ICD-10-CM | POA: Diagnosis not present

## 2022-04-30 DIAGNOSIS — E119 Type 2 diabetes mellitus without complications: Secondary | ICD-10-CM | POA: Diagnosis not present

## 2022-04-30 DIAGNOSIS — Z794 Long term (current) use of insulin: Secondary | ICD-10-CM | POA: Diagnosis not present

## 2022-04-30 DIAGNOSIS — R1011 Right upper quadrant pain: Secondary | ICD-10-CM | POA: Diagnosis not present

## 2022-04-30 DIAGNOSIS — I5032 Chronic diastolic (congestive) heart failure: Secondary | ICD-10-CM | POA: Diagnosis not present

## 2022-04-30 LAB — COMPREHENSIVE METABOLIC PANEL
ALT: 12 U/L (ref 0–44)
AST: 13 U/L — ABNORMAL LOW (ref 15–41)
Albumin: 3.2 g/dL — ABNORMAL LOW (ref 3.5–5.0)
Alkaline Phosphatase: 76 U/L (ref 38–126)
Anion gap: 5 (ref 5–15)
BUN: 27 mg/dL — ABNORMAL HIGH (ref 6–20)
CO2: 28 mmol/L (ref 22–32)
Calcium: 7.8 mg/dL — ABNORMAL LOW (ref 8.9–10.3)
Chloride: 102 mmol/L (ref 98–111)
Creatinine, Ser: 1.14 mg/dL — ABNORMAL HIGH (ref 0.44–1.00)
GFR, Estimated: 58 mL/min — ABNORMAL LOW (ref >60)
Glucose, Bld: 151 mg/dL — ABNORMAL HIGH (ref 70–99)
Potassium: 3.4 mmol/L — ABNORMAL LOW (ref 3.5–5.1)
Sodium: 135 mmol/L (ref 135–145)
Total Bilirubin: 0.8 mg/dL (ref 0.3–1.2)
Total Protein: 7.1 g/dL (ref 6.5–8.1)

## 2022-04-30 LAB — CBC
HCT: 39.2 % (ref 36.0–46.0)
Hemoglobin: 11.7 g/dL — ABNORMAL LOW (ref 12.0–15.0)
MCH: 25.8 pg — ABNORMAL LOW (ref 26.0–34.0)
MCHC: 29.8 g/dL — ABNORMAL LOW (ref 30.0–36.0)
MCV: 86.3 fL (ref 80.0–100.0)
Platelets: 288 10*3/uL (ref 150–400)
RBC: 4.54 MIL/uL (ref 3.87–5.11)
RDW: 14.8 % (ref 11.5–15.5)
WBC: 8.1 10*3/uL (ref 4.0–10.5)
nRBC: 0 % (ref 0.0–0.2)

## 2022-04-30 LAB — HIV ANTIBODY (ROUTINE TESTING W REFLEX): HIV Screen 4th Generation wRfx: NONREACTIVE

## 2022-04-30 LAB — GLUCOSE, CAPILLARY: Glucose-Capillary: 107 mg/dL — ABNORMAL HIGH (ref 70–99)

## 2022-04-30 MED ORDER — POTASSIUM CHLORIDE CRYS ER 20 MEQ PO TBCR
40.0000 meq | EXTENDED_RELEASE_TABLET | Freq: Once | ORAL | Status: AC
  Administered 2022-04-30: 40 meq via ORAL
  Filled 2022-04-30: qty 2

## 2022-04-30 MED ORDER — CYCLOBENZAPRINE HCL 10 MG PO TABS: 10.0000 mg | ORAL_TABLET | Freq: Three times a day (TID) | ORAL | 0 refills | Status: DC | PRN

## 2022-04-30 MED ORDER — ORAL CARE MOUTH RINSE: 15.0000 mL | OROMUCOSAL | Status: DC | PRN

## 2022-04-30 MED ORDER — HYDROCODONE-ACETAMINOPHEN 5-325 MG PO TABS: 1.0000 | ORAL_TABLET | Freq: Four times a day (QID) | ORAL | 0 refills | Status: DC | PRN

## 2022-04-30 NOTE — Hospital Course (Addendum)
Taken from prior notes.  Sara Zhang is a 52 y.o. female with medical history significant of HFrEF with recovered EF (last 55-60%), chronic hypoxic respiratory failure on 2 L at home, OSA on CPAP, hypertension, hyperlipidemia, type 2 diabetes, CKD stage II, who presents to the ED with complaints of right upper quadrant pain for 2 to 3 weeks.  Pain started after a viral illness and severe cough but persisted even after resolution of coughing.  ED course.  Vitals and labs mostly stable.  CT of the abdomen was obtained that demonstrated focal intrahepatic biliary dilation in the lateral left segment.  MRCP was subsequently obtained that demonstrated segmental biliary ductal dilation of the left lobe of the liver in addition to common bile duct dilation up to 1.4 cm.  No calculus or obstructing lesion noted.  GI consulted.   As there was no obvious obstruction and labs were normal, GI is recommending outpatient follow-up for workup of her biliary duct dilation, patient has an history of cholecystectomy. Pain is thought to be due to musculoskeletal in origin.  3/26: Vital stable.  Labs seems stable with slight increase in creatinine to 1.14 which does not meet criteria for AKI, mild hypokalemia with potassium of 3.4, patient is on p.o. Lasix, holding today's dose of Lasix and replating potassium.  Labs for abnormal CBD dilation which include IgG4, anti-smooth muscle antibodies and mitochondrial antibodies are pending.  GI will follow-up those labs as outpatient.  They might consider doing an ERCP for further evaluation which will be done as outpatient.  Her current pain is most likely musculoskeletal as increased with moving.  No emergent need for any intervention.  She has been given pain medications and muscle relaxant with advised to be mindful about dizziness and constipation.  Patient was also recently given lidocaine patches by her PCP but apparently insurance refused to pay.  She was advised to  get over-the-counter lidocaine patches if needed.  She will continue with her current medications with the addition of pain very send and muscle relaxant to be used as needed only.  Plan was discussed with patient and her fianc at bedside.  Patient will follow-up with gastroenterology as an outpatient and with her primary care provider for further management.

## 2022-04-30 NOTE — Care Plan (Signed)
Brief GI Note D/w hospitalist. LFTs still wnl. Pt to be arranged for ercp as outpatient. Will be contacted by [facility] office No inpatient GI intervention indicated at this time.  GI to sign off. Available as needed. Please do not hesitate to call regarding questions or concerns.  Ronne Binning, [facility]

## 2022-04-30 NOTE — Telephone Encounter (Signed)
Appt scheduled for 4/30 per verbal Dr Allen Norris, 1 week f/u not needed 3-4 weeks will be okay...  Letter mailed to pt with appt and office info

## 2022-04-30 NOTE — Discharge Summary (Signed)
Physician Discharge Summary   Patient: Sara Zhang MRN: [id number] DOB: 1970/11/29  Admit date:     04/29/2022  Discharge date: 04/30/22  Discharge Physician: Lorella Nimrod   PCP: Venita Lick, NP   Recommendations at discharge:  Please follow-up for IgG4, anti-smooth muscle antibody and mitochondrial antibody results. Follow-up with gastroenterology Follow-up with primary care provider  Discharge Diagnoses: Principal Problem:   RUQ abdominal pain Active Problems:   Dilation of biliary tract   Essential hypertension   Type 2 diabetes mellitus treated with insulin (HCC)   Chronic heart failure with preserved ejection fraction (HCC)   OSA on CPAP   CKD (chronic kidney disease) stage 3, GFR 30-59 ml/min [facility])   Hospital Course: Taken from prior notes.  Sara Zhang is a 53 y.o. female with medical history significant of HFrEF with recovered EF (last 55-60%), chronic hypoxic respiratory failure on 2 L at home, OSA on CPAP, hypertension, hyperlipidemia, type 2 diabetes, CKD stage II, who presents to the ED with complaints of right upper quadrant pain for 2 to 3 weeks.  Pain started after a viral illness and severe cough but persisted even after resolution of coughing.  ED course.  Vitals and labs mostly stable.  CT of the abdomen was obtained that demonstrated focal intrahepatic biliary dilation in the lateral left segment.  MRCP was subsequently obtained that demonstrated segmental biliary ductal dilation of the left lobe of the liver in addition to common bile duct dilation up to 1.4 cm.  No calculus or obstructing lesion noted.  GI consulted.   As there was no obvious obstruction and labs were normal, GI is recommending outpatient follow-up for workup of her biliary duct dilation, patient has an history of cholecystectomy. Pain is thought to be due to musculoskeletal in origin.  3/26: Vital stable.  Labs seems stable with slight increase in creatinine to 1.14 which does  not meet criteria for AKI, mild hypokalemia with potassium of 3.4, patient is on p.o. Lasix, holding today's dose of Lasix and replating potassium.  Labs for abnormal CBD dilation which include IgG4, anti-smooth muscle antibodies and mitochondrial antibodies are pending.  GI will follow-up those labs as outpatient.  They might consider doing an ERCP for further evaluation which will be done as outpatient.  Her current pain is most likely musculoskeletal as increased with moving.  No emergent need for any intervention.  She has been given pain medications and muscle relaxant with advised to be mindful about dizziness and constipation.  Patient was also recently given lidocaine patches by her PCP but apparently insurance refused to pay.  She was advised to get over-the-counter lidocaine patches if needed.  She will continue with her current medications with the addition of pain very send and muscle relaxant to be used as needed only.  Plan was discussed with patient and her fianc at bedside.  Patient will follow-up with gastroenterology as an outpatient and with her primary care provider for further management.      Assessment and Plan: * RUQ abdominal pain Patient presenting with 2 to 3-week history of intermittent right upper quadrant abdominal pain with evidence of biliary dilation on CT and MRCP.  Pain is reproducible with certain position changes which is not quite consistent with biliary etiology.  LFTs are within normal limits.  - GI consulted; appreciate their recommendations - Pain control with trial of Robaxin and Dilaudid - Continuous pulse oximetry while receiving opioids given chronic hypoxic respiratory failure  Dilation of biliary  tract CT and MRCP obtained today with evidence of intrahepatic and common bile duct dilation.  Current pain is not completely consistent with biliary etiology, however patient will require workup of her biliary dilation.  - GI consulted;  appreciate their recommendations - EUS and ERCP timing per GI - Repeat CMP in the a.m.  Essential hypertension Patient has a history of hypertension on multiple antihypertensives.  Blood pressure today is on the lower end of normal, likely due to IV opioid use for pain control.  She denies any symptoms including dizziness, chest pain or shortness of breath.  - Will hold her home antihypertensives today and monitor closely.  Will restart as able  CKD (chronic kidney disease) stage 3, GFR 30-59 ml/min (HCC) Renal function currently at baseline.  Will continue to monitor while admitted.  OSA on CPAP - CPAP at bedtime  Chronic heart failure with preserved ejection fraction ([place]) On examination, patient appears euvolemic.  - Continue home GDMT and Lasix - Continue home supplemental oxygen to maintain oxygen saturation above 88%  Type 2 diabetes mellitus treated with insulin ([place]) - Hold home antiglycemic agents with exception of Jardiance given that is for her HFrEF - SSI, moderate - Semglee 20 units at bedtime   Pain control - [place] Controlled Substance Reporting System database was reviewed. and patient was instructed, not to drive, operate heavy machinery, perform activities at heights, swimming or participation in water activities or provide baby-sitting services while on Pain, Sleep and Anxiety Medications; until their outpatient Physician has advised to do so again. Also recommended to not to take more than prescribed Pain, Sleep and Anxiety Medications.  Consultants: Gastroenterology Procedures performed: None Disposition: Home Diet recommendation:  Discharge Diet Orders (From admission, onward)     Start     Ordered   04/30/22 0000  Diet - low sodium heart healthy        04/30/22 1022           Cardiac and Carb modified diet DISCHARGE MEDICATION: Allergies as of 04/30/2022   No Known Allergies      Medication List     TAKE these medications     albuterol 108 (90 Base) MCG/ACT inhaler Commonly known as: VENTOLIN HFA INHALE 2 PUFFS INTO THE LUNGS EVERY 6 HOURS AS NEEDED FOR WHEEZING OR SHORTNESS OF BREATH   amLODipine 5 MG tablet Commonly known as: NORVASC TAKE 1 TABLET(5 MG) BY MOUTH DAILY   atenolol 50 MG tablet Commonly known as: TENORMIN TAKE 1 TABLET(50 MG) BY MOUTH DAILY   atorvastatin 10 MG tablet Commonly known as: LIPITOR TAKE 1 TABLET(10 MG) BY MOUTH DAILY   Basaglar KwikPen 100 UNIT/ML Inject 52 Units into the skin at bedtime.   Contour Next Monitor w/Device Kit To check blood sugar 4 times daily.   cyclobenzaprine 10 MG tablet Commonly known as: FLEXERIL Take 1 tablet (10 mg total) by mouth 3 (three) times daily as needed for muscle spasms.   furosemide 40 MG tablet Commonly known as: LASIX TAKE 1 TABLET(40 MG) BY MOUTH TWICE DAILY   glucose blood test strip Commonly known as: Contour Next Test TEST BLOOD SUGAR ONCE DAILY   HYDROcodone-acetaminophen 5-325 MG tablet Commonly known as: NORCO/VICODIN Take 1 tablet by mouth every 6 (six) hours as needed for severe pain.   Insulin Pen Needle 32G X 4 MM Misc 1 Units by Does not apply route every morning. Pen needles   Jardiance 25 MG Tabs tablet Generic drug: empagliflozin TAKE 1 TABLET(25 MG)  BY MOUTH DAILY   levonorgestrel 20 MCG/24HR IUD Commonly known as: MIRENA 1 each by Intrauterine route once.   lidocaine 5 % Commonly known as: Lidoderm Place 1 patch onto the skin daily. Remove & Discard patch within 12 hours or as directed by MD   losartan 25 MG tablet Commonly known as: COZAAR TAKE 1 TABLET(25 MG) BY MOUTH DAILY   onetouch ultrasoft lancets Use as instructed   OXYGEN Inhale 2 L into the lungs daily.   Oyster Shell Calcium 500 + D 500-3.125 MG-MCG Tabs Generic drug: Calcium Carbonate-Vitamin D Take 1 tablet by mouth daily.   Trulicity 3 0000000 Sopn Generic drug: Dulaglutide Inject 3 mg as directed once a week.    Vitamin D3 10 MCG (400 UNIT) Tabs tablet Generic drug: cholecalciferol Take 400 Units by mouth daily.        Follow-up Information     Venita Lick, NP. Schedule an appointment as soon as possible for a visit in 1 week(s).   Specialty: Nurse Practitioner Contact information: [address] [place] [address] [phone]         Lucilla Lame, MD. Schedule an appointment as soon as possible for a visit in 1 week(s).   Specialty: Gastroenterology Contact information: [place] [address] [phone]                Discharge Exam: Danley Danker Weights   04/29/22 [id number]  Weight: (!) 164.5 kg   General.  Morbidly obese lady, in no acute distress. Pulmonary.  Lungs clear bilaterally, normal respiratory effort. CV.  Regular rate and rhythm, no JVD, rub or murmur. Abdomen.  Soft, nontender, nondistended, BS positive. CNS.  Alert and oriented .  No focal neurologic deficit. Extremities.  No edema, no cyanosis, pulses intact and symmetrical. Psychiatry.  Judgment and insight appears normal.   Condition at discharge: stable  The results of significant diagnostics from this hospitalization (including imaging, microbiology, ancillary and laboratory) are listed below for reference.   Imaging Studies: MR ABDOMEN MRCP W WO CONTAST  Result Date: 04/29/2022 CLINICAL DATA:  Right upper quadrant pain for 2 weeks, worsening, history of cholecystectomy. Focal left lobe biliary ductal dilatation identified by prior CT EXAM: MRI ABDOMEN WITHOUT AND WITH CONTRAST (INCLUDING MRCP) TECHNIQUE: Multiplanar multisequence MR imaging of the abdomen was performed both before and after the administration of intravenous contrast. Heavily T2-weighted images of the biliary and pancreatic ducts were obtained, and three-dimensional MRCP images were rendered by post processing. CONTRAST:  64mL GADAVIST GADOBUTROL 1 MMOL/ML IV SOLN COMPARISON:  Same-day CT abdomen pelvis, 04/29/2022, CT  abdomen pelvis, 05/16/2011 FINDINGS: Lower chest: No acute abnormality. Hepatobiliary: As reported by prior CT, there is segmental biliary ductal dilatation of the left lobe of the liver, involving hepatic segments II and III. There is atrophy of the left lobe, this overall morphology similar to prior examination dated 2013, however biliary ductal dilatation is new. There is no directly visualized lesion or other abnormality within the central left lobe of the liver near the nidus of ductal dilatation (series 23, image 32). Status post cholecystectomy, with postoperative dilatation of the common bile duct up to 1.4 cm in caliber. No obstructing calculus or other lesion identified to the ampulla. No right-sided intrahepatic biliary ductal dilatation. Pancreas: Unremarkable. No pancreatic ductal dilatation or surrounding inflammatory changes. Spleen: Normal in size without significant abnormality. Adrenals/Urinary Tract: Adrenal glands are unremarkable. Multiple simple, benign bilateral renal cortical cysts, for which no further follow-up or characterization  is required. Kidneys are otherwise normal, without renal calculi, solid lesion, or hydronephrosis. Stomach/Bowel: Stomach is within normal limits. No evidence of bowel wall thickening, distention, or inflammatory changes. Vascular/Lymphatic: No significant vascular findings are present. No enlarged abdominal lymph nodes. Other: No abdominal wall hernia or abnormality. No ascites. Musculoskeletal: No acute or significant osseous findings. IMPRESSION: 1. As reported by prior CT, there is segmental biliary ductal dilatation of the left lobe of the liver, involving hepatic segments II and III. Atrophy of the left lobe, this overall morphology similar to prior examination dated 05/16/2011, however biliary ductal dilatation is new. There is no directly visualized lesion or other abnormality within the central left lobe of the liver of the nidus of ductal dilatation.  Differential considerations include stricture and very small occult mass such as cholangiocarcinoma. Consider ERCP for further evaluation. 2. Status post cholecystectomy, with postoperative dilatation of the common bile duct up to 1.4 cm in caliber. No obstructing calculus or other lesion identified to the ampulla. No right-sided intrahepatic biliary ductal dilatation. Electronically Signed   By: Delanna Ahmadi M.D.   On: 04/29/2022 11:32   MR 3D Recon At Scanner  Result Date: 04/29/2022 CLINICAL DATA:  Right upper quadrant pain for 2 weeks, worsening, history of cholecystectomy. Focal left lobe biliary ductal dilatation identified by prior CT EXAM: MRI ABDOMEN WITHOUT AND WITH CONTRAST (INCLUDING MRCP) TECHNIQUE: Multiplanar multisequence MR imaging of the abdomen was performed both before and after the administration of intravenous contrast. Heavily T2-weighted images of the biliary and pancreatic ducts were obtained, and three-dimensional MRCP images were rendered by post processing. CONTRAST:  52mL GADAVIST GADOBUTROL 1 MMOL/ML IV SOLN COMPARISON:  Same-day CT abdomen pelvis, 04/29/2022, CT abdomen pelvis, 05/16/2011 FINDINGS: Lower chest: No acute abnormality. Hepatobiliary: As reported by prior CT, there is segmental biliary ductal dilatation of the left lobe of the liver, involving hepatic segments II and III. There is atrophy of the left lobe, this overall morphology similar to prior examination dated 2013, however biliary ductal dilatation is new. There is no directly visualized lesion or other abnormality within the central left lobe of the liver near the nidus of ductal dilatation (series 23, image 32). Status post cholecystectomy, with postoperative dilatation of the common bile duct up to 1.4 cm in caliber. No obstructing calculus or other lesion identified to the ampulla. No right-sided intrahepatic biliary ductal dilatation. Pancreas: Unremarkable. No pancreatic ductal dilatation or surrounding  inflammatory changes. Spleen: Normal in size without significant abnormality. Adrenals/Urinary Tract: Adrenal glands are unremarkable. Multiple simple, benign bilateral renal cortical cysts, for which no further follow-up or characterization is required. Kidneys are otherwise normal, without renal calculi, solid lesion, or hydronephrosis. Stomach/Bowel: Stomach is within normal limits. No evidence of bowel wall thickening, distention, or inflammatory changes. Vascular/Lymphatic: No significant vascular findings are present. No enlarged abdominal lymph nodes. Other: No abdominal wall hernia or abnormality. No ascites. Musculoskeletal: No acute or significant osseous findings. IMPRESSION: 1. As reported by prior CT, there is segmental biliary ductal dilatation of the left lobe of the liver, involving hepatic segments II and III. Atrophy of the left lobe, this overall morphology similar to prior examination dated 05/16/2011, however biliary ductal dilatation is new. There is no directly visualized lesion or other abnormality within the central left lobe of the liver of the nidus of ductal dilatation. Differential considerations include stricture and very small occult mass such as cholangiocarcinoma. Consider ERCP for further evaluation. 2. Status post cholecystectomy, with postoperative dilatation of the common bile  duct up to 1.4 cm in caliber. No obstructing calculus or other lesion identified to the ampulla. No right-sided intrahepatic biliary ductal dilatation. Electronically Signed   By: Delanna Ahmadi M.D.   On: 04/29/2022 11:32   CT ABDOMEN PELVIS W CONTRAST  Result Date: 04/29/2022 CLINICAL DATA:  2 week history of right upper quadrant pain with progressive worsening. EXAM: CT ABDOMEN AND PELVIS WITH CONTRAST TECHNIQUE: Multidetector CT imaging of the abdomen and pelvis was performed using the standard protocol following bolus administration of intravenous contrast. RADIATION DOSE REDUCTION: This exam was  performed according to the departmental dose-optimization program which includes automated exposure control, adjustment of the mA and/or kV according to patient size and/or use of iterative reconstruction technique. CONTRAST:  124mL OMNIPAQUE IOHEXOL 350 MG/ML SOLN COMPARISON:  05/16/2011 FINDINGS: Lower chest: Subsegmental atelectasis noted dependent left lung base. Hepatobiliary: Intrahepatic biliary dilatation noted lateral segment left liver, new since most recent comparison study of chest CT 06/08/2012. No suspicious focal abnormality in the liver on this study without intravenous contrast. Cholecystectomy. No common bile duct dilatation. Pancreas: No focal mass lesion. No dilatation of the main duct. No intraparenchymal cyst. No peripancreatic edema. Spleen: No splenomegaly. No focal mass lesion. Adrenals/Urinary Tract: No adrenal nodule or mass. 2.3 cm well-defined homogeneous water density lesion interpolar right kidney is compatible with a cyst. No followup imaging is recommended. Left kidney unremarkable. No evidence for hydroureter. The urinary bladder appears normal for the degree of distention. Stomach/Bowel: Stomach is unremarkable. No gastric wall thickening. No evidence of outlet obstruction. Duodenum is normally positioned as is the ligament of Treitz. No small bowel wall thickening. No small bowel dilatation. The terminal ileum is normal. The appendix is normal. No gross colonic mass. No colonic wall thickening. Diverticular changes are noted in the left colon without evidence of diverticulitis. Vascular/Lymphatic: No abdominal aortic aneurysm. There is no gastrohepatic or hepatoduodenal ligament lymphadenopathy. No retroperitoneal or mesenteric lymphadenopathy. No pelvic sidewall lymphadenopathy. Reproductive: IUD is visualized in the uterus. IUD placement appears somewhat low on sagittal imaging with the inferior stem probably in the region of the internal cervical os. There is no adnexal mass.  Other: No intraperitoneal free fluid. Musculoskeletal: No worrisome lytic or sclerotic osseous abnormality. IMPRESSION: 1. Focal intrahepatic biliary dilatation noted in the lateral segment left liver, new since most recent comparison study of 06/08/2012. No dilatation of the right hepatic ducts or evidence of common bile duct dilatation. Correlation with liver function test recommended. MRI abdomen with and without contrast recommended to further evaluate. 2. IUD placement appears somewhat low on sagittal imaging with the inferior tip of the stem probably in the region of the internal cervical os. 3. Left colonic diverticulosis without diverticulitis. Electronically Signed   By: Misty Stanley M.D.   On: 04/29/2022 08:55    Microbiology: Results for orders placed or performed in visit on 04/11/22  WET PREP FOR [place], YEAST, CLUE     Status: Abnormal   Collection Time: 04/11/22  9:16 AM   Specimen: Urine   Urine  Result Value Ref Range Status   Trichomonas Exam Negative Negative Final   Yeast Exam Negative Negative Final   Clue Cell Exam Positive (A) Negative Final  Microscopic Examination     Status: None   Collection Time: 04/11/22  9:16 AM   Urine  Result Value Ref Range Status   WBC, UA 0-5 0 - 5 /hpf Final   RBC, Urine 0-2 0 - 2 /hpf Final   Epithelial Cells (non  renal) 0-10 0 - 10 /hpf Final   Bacteria, UA None seen None seen/Few Final    Labs: CBC: Recent Labs  Lab 04/29/22 0638 04/30/22 0642  WBC 8.5 8.1  HGB 12.5 11.7*  HCT 41.6 39.2  MCV 87.9 86.3  PLT 331 [id number]   Basic Metabolic Panel: Recent Labs  Lab 04/29/22 0638 04/30/22 0642  NA 138 135  K 3.8 3.4*  CL 98 102  CO2 29 28  GLUCOSE 108* 151*  BUN 18 27*  CREATININE 0.98 1.14*  CALCIUM 8.4* 7.8*   Liver Function Tests: Recent Labs  Lab 04/29/22 0638 04/30/22 0642  AST 13* 13*  ALT 14 12  ALKPHOS 88 76  BILITOT 1.0 0.8  PROT 7.8 7.1  ALBUMIN 3.4* 3.2*   CBG: Recent Labs  Lab 04/29/22 1702  04/29/22 2106 04/30/22 0824  GLUCAP 99 159* 107*    Discharge time spent: greater than 30 minutes.  This record has been created using Systems analyst. Errors have been sought and corrected,but may not always be located. Such creation errors do not reflect on the standard of care.   Signed: Lorella Nimrod, MD [facility] 04/30/2022

## 2022-04-30 NOTE — Progress Notes (Signed)
DISCHARGE EDUCATION COMPLETED. IV REMOVED. PATIENT TRANSPORTED TO THE EXIT VIA WHEEL CHAIR ACCOMPANIED BY STAFF.  [name] [name]

## 2022-04-30 NOTE — TOC Initial Note (Signed)
Transition of Care [facility]) - Initial/Assessment Note    Patient Details  Name: Sara Zhang MRN: [id number] Date of Birth: January 02, 1971  Transition of Care [facility]) CM/SW Contact:    Beverly Sessions, RN Phone Number: 04/30/2022, 10:35 AM  Clinical Narrative:                  Transition of Care [facility]) Screening Note   Patient Details  Name: Sara Zhang Date of Birth: [birth year]   Transition of Care [facility]) CM/SW Contact:    Beverly Sessions, RN Phone Number: 04/30/2022, 10:35 AM    Transition of Care Department [facility]) has reviewed patient and no TOC needs have been identified at this time. We will continue to monitor patient advancement through interdisciplinary progression rounds. If new patient transition needs arise, please place a TOC consult.          Patient Goals and CMS Choice            Expected Discharge Plan and Services         Expected Discharge Date: 04/30/22                                    Prior Living Arrangements/Services                       Activities of Daily Living Home Assistive Devices/Equipment: CPAP, Oxygen ADL Screening (condition at time of admission) Patient's cognitive ability adequate to safely complete daily activities?: Yes Is the patient deaf or have difficulty hearing?: No Does the patient have difficulty seeing, even when wearing glasses/contacts?: No Does the patient have difficulty concentrating, remembering, or making decisions?: No Patient able to express need for assistance with ADLs?: No Does the patient have difficulty dressing or bathing?: No Independently performs ADLs?: Yes (appropriate for developmental age) Does the patient have difficulty walking or climbing stairs?: No Weakness of Legs: None Weakness of Arms/Hands: None  Permission Sought/Granted                  Emotional Assessment              Admission diagnosis:  RUQ abdominal pain [R10.11] Right upper quadrant  abdominal pain [R10.11] Dilation of biliary tract [K83.8] Patient Active Problem List   Diagnosis Date Noted   RUQ abdominal pain 04/29/2022   Dilation of biliary tract 04/29/2022   Muscle strain 04/11/2022   Hypocalcemia 02/17/2022   B12 deficiency 03/27/2021   IUD (intrauterine device) in place 12/24/2020   Cough variant asthma vs Upper Airway cough syndrome 10/14/2020   CKD (chronic kidney disease) stage 3, GFR 30-59 ml/min (HCC) 06/06/2020   OSA on CPAP 03/27/2020   Proteinuria due to type 2 diabetes mellitus ([place]) 06/15/2019   Chronic heart failure with preserved ejection fraction ([place]) 10/22/2018   NICM (nonischemic cardiomyopathy) ([place]) 10/22/2018   BMI 50.0-59.9, adult ([place]) 11/18/2017   Essential hypertension 10/30/2016   Type 2 diabetes mellitus treated with insulin ([place]) 04/25/2015   Hyperlipidemia associated with type 2 diabetes mellitus ([place]) 11/10/2014   Morbid obesity ([place]) 11/10/2014   Vitamin D deficiency 11/10/2014   Benign hypertensive heart and kidney disease with CHF and stage 2 chronic kidney disease ([place]) 11/10/2014   PCP:  Venita Lick, NP Pharmacy:   [facility] [phone] [address], [place] AT [facility] [place]   [id number] Phone: [phone] Fax: [phone]     Social Determinants of Health (SDOH) Social History: SDOH Screenings   Food Insecurity: No Food Insecurity (04/30/2022)  Housing: Low Risk  (04/30/2022)  Transportation Needs: No Transportation Needs (04/30/2022)  Utilities: Not At Risk (04/30/2022)  Alcohol Screen: Low Risk  (09/24/2021)  Depression (PHQ2-9): Low Risk  (04/11/2022)  Financial Resource Strain: Low Risk  (03/11/2022)  Physical Activity: Inactive (09/24/2021)  Social Connections: Unknown (09/24/2021)  Stress: No Stress Concern Present (09/24/2021)  Tobacco Use: Low Risk  (04/29/2022)   SDOH Interventions:     Readmission Risk Interventions     No data to display

## 2022-04-30 NOTE — Telephone Encounter (Signed)
[name]-this patient needs an office visit to review a possible ercp.

## 2022-05-01 LAB — IGG 4: IgG, Subclass 4: 25 mg/dL (ref 2–96)

## 2022-05-01 LAB — MITOCHONDRIAL ANTIBODIES: Mitochondrial M2 Ab, IgG: <20 Units (ref 0.0–20.0)

## 2022-05-01 LAB — ANTI-SMOOTH MUSCLE ANTIBODY, IGG: F-Actin IgG: 12 Units (ref 0–19)

## 2022-05-10 ENCOUNTER — Telehealth: Payer: Self-pay

## 2022-05-10 NOTE — Progress Notes (Cosign Needed)
Care Management & Coordination Services Pharmacy Team  Reason for Encounter: Diabetes  Contacted patient to discuss diabetes disease state. Unsuccessful outreach. Left voicemail for patient to return call.   Recent office visits:  04/11/22-Jolene T. Harvest Dark, NP (PCP) Seen for pain. Start on Flagyl 500 BID for 7 days. Return for as scheduled March 25th.   Recent consult visits:  03/29/22-Christopher End, MD (Cardiology) Follow up visit on heart failure. Follow up in 1 year.  03/25/22-Saadat Welton Flakes, MD (Pulmonology) Seen for sleep apnea.  Hospital visits:  Medication Reconciliation was completed by comparing discharge summary, patient's EMR and Pharmacy list, and upon discussion with patient.  Admitted to the hospital on 04/29/22 due to Abdominal pain. Discharge date was 04/30/22. Discharged from [facility].    New?Medications Started at [facility] Discharge:?? -started None noted  Medication Changes at Hospital Discharge: -Changed None noted  Medications Discontinued at Hospital Discharge: -Stopped None noted  Medications that remain the same after Hospital Discharge:??  -All other medications will remain the same.    Medications: Outpatient Encounter Medications as of 05/10/2022  Medication Sig   albuterol (VENTOLIN HFA) 108 (90 Base) MCG/ACT inhaler INHALE 2 PUFFS INTO THE LUNGS EVERY 6 HOURS AS NEEDED FOR WHEEZING OR SHORTNESS OF BREATH   amLODipine (NORVASC) 5 MG tablet TAKE 1 TABLET(5 MG) BY MOUTH DAILY   atenolol (TENORMIN) 50 MG tablet TAKE 1 TABLET(50 MG) BY MOUTH DAILY   atorvastatin (LIPITOR) 10 MG tablet TAKE 1 TABLET(10 MG) BY MOUTH DAILY   Blood Glucose Monitoring Suppl (CONTOUR NEXT MONITOR) w/Device KIT To check blood sugar 4 times daily.   Calcium Carbonate-Vitamin D (OYSTER SHELL CALCIUM 500 + D) 500-125 MG-UNIT TABS Take 1 tablet by mouth daily.    Cholecalciferol (VITAMIN D3) 10 MCG (400 UNIT) tablet Take 400 Units by mouth daily.    cyclobenzaprine (FLEXERIL) 10 MG tablet Take 1 tablet (10 mg total) by mouth 3 (three) times daily as needed for muscle spasms.   Dulaglutide (TRULICITY) 3 MG/0.5ML SOPN Inject 3 mg as directed once a week.   furosemide (LASIX) 40 MG tablet TAKE 1 TABLET(40 MG) BY MOUTH TWICE DAILY   glucose blood (CONTOUR NEXT TEST) test strip TEST BLOOD SUGAR ONCE DAILY   HYDROcodone-acetaminophen (NORCO/VICODIN) 5-325 MG tablet Take 1 tablet by mouth every 6 (six) hours as needed for severe pain.   Insulin Glargine (BASAGLAR KWIKPEN) 100 UNIT/ML Inject 52 Units into the skin at bedtime.   Insulin Pen Needle 32G X 4 MM MISC 1 Units by Does not apply route every morning. Pen needles   JARDIANCE 25 MG TABS tablet TAKE 1 TABLET(25 MG) BY MOUTH DAILY   Lancets (ONETOUCH ULTRASOFT) lancets Use as instructed   levonorgestrel (MIRENA) 20 MCG/24HR IUD 1 each by Intrauterine route once.    lidocaine (LIDODERM) 5 % Place 1 patch onto the skin daily. Remove & Discard patch within 12 hours or as directed by MD (Patient not taking: Reported on 04/29/2022)   losartan (COZAAR) 25 MG tablet TAKE 1 TABLET(25 MG) BY MOUTH DAILY   OXYGEN Inhale 2 L into the lungs daily.   No facility-administered encounter medications on file as of 05/10/2022.    Recent Relevant Labs: Lab Results  Component Value Date/Time   HGBA1C 7.7 (H) 01/21/2022 08:15 AM   HGBA1C 7.1 (H) 03/27/2021 09:16 AM   MICROALBUR 80 (H) 03/27/2021 09:16 AM   MICROALBUR 80 (H) 12/25/2020 10:54 AM    Kidney Function Lab Results  Component Value Date/Time   CREATININE  1.14 (H) 04/30/2022 06:42 AM   CREATININE 0.98 04/29/2022 06:38 AM   CREATININE 0.75 09/25/2013 02:00 PM   CREATININE 0.89 06/17/2013 11:51 AM   GFRNONAA 58 (L) 04/30/2022 06:42 AM   GFRNONAA >60 09/25/2013 02:00 PM   GFRAA 61 03/08/2020 09:29 AM   GFRAA >60 09/25/2013 02:00 PM   Current antihyperglycemic regimen:  Trulicity 3 mg inject 3 mg weekly Basaglar 100 units inject 52 units at  bedtime Jardiance 25 mg take 1 tablet daily  Adherence Review: Is the patient currently on a STATIN medication? Yes Is the patient currently on ACE/ARB medication? Yes Does the patient have >5 day gap between last estimated fill dates? No  Star Rating Drugs:  Losartan 25 mg Last filed:02/22/22 90 DS, 11/21/21 90 DS Jardiance 25 mg Last filled:05/06/22 30 DS, 04/04/22 30 DS Basaglar 100 units Last filled:03/06/22 83 DS, 12/10/21 83 DS Trulicity 3 mg Last filled:02/27/22 84 DS, 12/10/21 84 DS Atorvastatin 10 mg Last filled:02/18/22 90 DS, 11/24/21 90 DS  Care Gaps: Annual wellness visit in last year? Yes Last eye exam / retinopathy screening:12/11/21 Last diabetic foot exam:01/21/22   Rance Muir, RMA

## 2022-05-18 NOTE — Patient Instructions (Incomplete)
Please call to schedule your mammogram and/or bone density: [facility]  Address: [address], [place], [place] [address] Phone: [phone]  [facility] [address] [place],  [place]  [address] Phone: [phone]    Abdominal Pain, Adult Many things can cause belly (abdominal) pain. Most times, belly pain is not dangerous. Many cases of belly pain can be watched and treated at home. Sometimes, though, belly pain is serious. Your doctor will try to find the cause of your belly pain. Follow these instructions at home:  Medicines Take over-the-counter and prescription medicines only as told by your doctor. Do not take medicines that help you poop (laxatives) unless told by your doctor. General instructions Watch your belly pain for any changes. Drink enough fluid to keep your pee (urine) pale yellow. Keep all follow-up visits as told by your doctor. This is important. Contact a doctor if: Your belly pain changes or gets worse. You are not hungry, or you lose weight without trying. You are having trouble pooping (constipated) or have watery poop (diarrhea) for more than 2-3 days. You have pain when you pee or poop. Your belly pain wakes you up at night. Your pain gets worse with meals, after eating, or with certain foods. You are vomiting and cannot keep anything down. You have a fever. You have blood in your pee. Get help right away if: Your pain does not go away as soon as your doctor says it should. You cannot stop vomiting. Your pain is only in areas of your belly, such as the right side or the left lower part of the belly. You have bloody or black poop, or poop that looks like tar. You have very bad pain, cramping, or bloating in your belly. You have signs of not having enough fluid or water in your body (dehydration), such as: Dark pee, very little pee, or no pee. Cracked lips. Dry  mouth. Sunken eyes. Sleepiness. Weakness. You have trouble breathing or chest pain. Summary Many cases of belly pain can be watched and treated at home. Watch your belly pain for any changes. Take over-the-counter and prescription medicines only as told by your doctor. Contact a doctor if your belly pain changes or gets worse. Get help right away if you have very bad pain, cramping, or bloating in your belly. This information is not intended to replace advice given to you by your health care provider. Make sure you discuss any questions you have with your health care provider. Document Revised: 06/01/2018 Document Reviewed: 06/01/2018 Elsevier Patient Education  2023 [employer].

## 2022-05-19 ENCOUNTER — Other Ambulatory Visit: Payer: Self-pay | Admitting: Nurse Practitioner

## 2022-05-19 ENCOUNTER — Other Ambulatory Visit: Payer: Self-pay | Admitting: Internal Medicine

## 2022-05-20 NOTE — Telephone Encounter (Signed)
Requested Prescriptions  Pending Prescriptions Disp Refills   atenolol (TENORMIN) 50 MG tablet [Pharmacy Med Name: ATENOLOL 50MG  TABLETS] 90 tablet 0    Sig: TAKE 1 TABLET(50 MG) BY MOUTH DAILY     Cardiovascular: Beta Blockers 2 Failed - 05/19/2022  6:25 AM      Failed - Cr in normal range and within 360 days    Creatinine  Date Value Ref Range Status  09/25/2013 0.75 0.60 - 1.30 mg/dL Final   Creatinine, Ser  Date Value Ref Range Status  04/30/2022 1.14 (H) 0.44 - 1.00 mg/dL Final         Passed - Last BP in normal range    BP Readings from Last 1 Encounters:  04/30/22 105/73         Passed - Last Heart Rate in normal range    Pulse Readings from Last 1 Encounters:  04/30/22 74         Passed - Valid encounter within last 6 months    Recent Outpatient Visits           1 month ago Right lower quadrant abdominal pain   [facility] [place], [place] T, NP   3 months ago Subacute cough   [place] [facility] [place], Cary T, NP   3 months ago Acute cough   [place] [facility] [place], [place] T, NP   3 months ago Type 2 diabetes mellitus treated with insulin (HCC)   [place] [facility] [place], [place] T, NP   8 months ago Type 2 diabetes mellitus treated with insulin (HCC)   [place] [facility] [place], Dorie Rank, NP       Future Appointments             In 2 days Marjie Skiff, NP [place] [facility]

## 2022-05-22 ENCOUNTER — Ambulatory Visit (INDEPENDENT_AMBULATORY_CARE_PROVIDER_SITE_OTHER): Payer: Medicare Other | Admitting: Nurse Practitioner

## 2022-05-22 ENCOUNTER — Encounter: Payer: Self-pay | Admitting: Nurse Practitioner

## 2022-05-22 VITALS — BP 109/74 | HR 77 | Temp 98.0°F | Ht 67.99 in | Wt 347.6 lb

## 2022-05-22 DIAGNOSIS — G4733 Obstructive sleep apnea (adult) (pediatric): Secondary | ICD-10-CM

## 2022-05-22 DIAGNOSIS — N1831 Chronic kidney disease, stage 3a: Secondary | ICD-10-CM | POA: Diagnosis not present

## 2022-05-22 DIAGNOSIS — E559 Vitamin D deficiency, unspecified: Secondary | ICD-10-CM

## 2022-05-22 DIAGNOSIS — I130 Hypertensive heart and chronic kidney disease with heart failure and stage 1 through stage 4 chronic kidney disease, or unspecified chronic kidney disease: Secondary | ICD-10-CM

## 2022-05-22 DIAGNOSIS — E1129 Type 2 diabetes mellitus with other diabetic kidney complication: Secondary | ICD-10-CM | POA: Diagnosis not present

## 2022-05-22 DIAGNOSIS — I5032 Chronic diastolic (congestive) heart failure: Secondary | ICD-10-CM | POA: Diagnosis not present

## 2022-05-22 DIAGNOSIS — R1011 Right upper quadrant pain: Secondary | ICD-10-CM

## 2022-05-22 DIAGNOSIS — E119 Type 2 diabetes mellitus without complications: Secondary | ICD-10-CM | POA: Diagnosis not present

## 2022-05-22 DIAGNOSIS — I428 Other cardiomyopathies: Secondary | ICD-10-CM

## 2022-05-22 DIAGNOSIS — Z6841 Body Mass Index (BMI) 40.0 and over, adult: Secondary | ICD-10-CM | POA: Diagnosis not present

## 2022-05-22 DIAGNOSIS — Z794 Long term (current) use of insulin: Secondary | ICD-10-CM | POA: Diagnosis not present

## 2022-05-22 DIAGNOSIS — Z1231 Encounter for screening mammogram for malignant neoplasm of breast: Secondary | ICD-10-CM

## 2022-05-22 DIAGNOSIS — E876 Hypokalemia: Secondary | ICD-10-CM

## 2022-05-22 DIAGNOSIS — N182 Chronic kidney disease, stage 2 (mild): Secondary | ICD-10-CM | POA: Diagnosis not present

## 2022-05-22 DIAGNOSIS — E785 Hyperlipidemia, unspecified: Secondary | ICD-10-CM

## 2022-05-22 DIAGNOSIS — E1169 Type 2 diabetes mellitus with other specified complication: Secondary | ICD-10-CM

## 2022-05-22 DIAGNOSIS — R809 Proteinuria, unspecified: Secondary | ICD-10-CM

## 2022-05-22 DIAGNOSIS — J45991 Cough variant asthma: Secondary | ICD-10-CM | POA: Diagnosis not present

## 2022-05-22 LAB — MICROALBUMIN, URINE WAIVED
Creatinine, Urine Waived: 50 mg/dL (ref 10–300)
Microalb, Ur Waived: 10 mg/L (ref 0–19)

## 2022-05-22 LAB — BAYER DCA HB A1C WAIVED: HB A1C (BAYER DCA - WAIVED): 7.4 % — ABNORMAL HIGH (ref 4.8–5.6)

## 2022-05-22 MED ORDER — HYDROCODONE-ACETAMINOPHEN 5-325 MG PO TABS: 1.0000 | ORAL_TABLET | Freq: Four times a day (QID) | ORAL | 0 refills | Status: DC | PRN

## 2022-05-22 MED ORDER — JARDIANCE 25 MG PO TABS: ORAL_TABLET | ORAL | 4 refills | Status: DC

## 2022-05-22 NOTE — Assessment & Plan Note (Addendum)
Chronic, ongoing.  Continue to monitor closely.  Maintain ARB on board for protection.  Avoid Ibuprofen products.

## 2022-05-22 NOTE — Assessment & Plan Note (Signed)
Recheck on labs today and recommend intake of calcium rich foods + calcium supplement daily.  Check PTH on labs today and CMP.

## 2022-05-22 NOTE — Assessment & Plan Note (Signed)
Chronic, ongoing.  Continue current medication regimen as is at goal.  Lipid panel today. 

## 2022-05-22 NOTE — Assessment & Plan Note (Signed)
Chronic, stable at this time without inhalers. Continue O2 2L at night.

## 2022-05-22 NOTE — Assessment & Plan Note (Signed)
Chronic, ongoing.  Continue supplement and recheck Vit D in office today. 

## 2022-05-22 NOTE — Assessment & Plan Note (Signed)
Chronic, ongoing.  Continue current use of O2 at night. She has no concerns today. 

## 2022-05-22 NOTE — Assessment & Plan Note (Signed)
Chronic, ongoing.  Followed by endo.  A1c today 7.4%, trend down, and urine ALB  47 July 2023. Continue collaboration with endocrinology, recommend she schedule with them.  Continue current medication regimen as ordered by them, however consider increase in Trulicity to 4.5 MG weekly next visit and reduction of insulin if supply Trulicity improves -- discussed with patient.  Could consider switch to [facility] in future, Losartan offering kidney protection.  Recommend she continue to monitor BS at home 2-3 times a day.  Refills as needed.  Return in 3 months.

## 2022-05-22 NOTE — Assessment & Plan Note (Signed)
Chronic, ongoing.  Continue collaboration with cardiology and current medication regimen as prescribed by them. °

## 2022-05-22 NOTE — Assessment & Plan Note (Signed)
Chronic, ongoing with BP at goal.  Euvolemic today.  Continue current medication regimen and collaboration with cardiology.  Recommend continued focus on diet and salt reduction.  Recommend: - Reminded to call for an overnight weight gain of >2 pounds or a weekly weight weight of >5 pounds - not adding salt to his food and has been reading food labels. Reviewed the importance of keeping daily sodium intake to <2000mg daily -- NO PORK SKINS - Avoid Ibuprofen 

## 2022-05-22 NOTE — Assessment & Plan Note (Addendum)
Ongoing with some improvement.  Will refill Norco today for short burst until she can visit with GI next week.  Appreciate their input on this more complex case.  She is aware to take Norco only minimally.

## 2022-05-22 NOTE — Assessment & Plan Note (Signed)
Chronic, stable.  BP below goal today.  Continue current medication regimen and collaboration with cardiology.  Recommend checking BP three mornings a week at home + documenting for providers. Education and recommendation provided today on DASH diet focus and avoidance of high sodium foods, and cutting back on pork skins, which patient states is difficult to do.  DASH diet focus.  LABS: CMP.  Return in 3 months.

## 2022-05-22 NOTE — Progress Notes (Signed)
BP 109/74   Pulse 77   Temp 98 F (36.7 C) (Oral)   Ht 5' 7.99" (1.727 m)   Wt (!) 347 lb 9.6 oz (157.7 kg)   SpO2 94%   BMI 52.86 kg/m    Subjective:    Patient ID: Sara Zhang, female    DOB: Mar 13, 1970, 52 y.o.   MRN: [id number]  HPI: Sara Zhang is a 52 y.o. female  Chief Complaint  Patient presents with   Hospitalization Follow-up    Abd pain on 04/29/22   Transition of Care Hospital Follow up.  Admitted to hospital on 04/29/22 and discharged 04/30/22 for abdominal pain.  She is scheduled to see GI outpatient next week for this.  Pain is improving at this time -- intermittent pain, <5/10 in nature.  Quick, stabbing pain to RUQ. History of cholecystectomy over 20 years ago. Denies any N&V, diarrhea, constipation, blood in stool, SOB, chest pain, fever.  Was taking Norco for pain, which she was discharged with.  This has helped her pain, rarely takes but is currently out of this.  "Recommendations at discharge:  Please follow-up for IgG4, anti-smooth muscle antibody and mitochondrial antibody results. Follow-up with gastroenterology Follow-up with primary care provider   Hospital Course: Taken from prior notes.   Sara Zhang is a 52 y.o. female with medical history significant of HFrEF with recovered EF (last 55-60%), chronic hypoxic respiratory failure on 2 L at home, OSA on CPAP, hypertension, hyperlipidemia, type 2 diabetes, CKD stage II, who presents to the ED with complaints of right upper quadrant pain for 2 to 3 weeks.  Pain started after a viral illness and severe cough but persisted even after resolution of coughing.   ED course.  Vitals and labs mostly stable. CT of the abdomen was obtained that demonstrated focal intrahepatic biliary dilation in the lateral left segment.  MRCP was subsequently obtained that demonstrated segmental biliary ductal dilation of the left lobe of the liver in addition to common bile duct dilation up to 1.4 cm.  No calculus or  obstructing lesion noted.  GI consulted.    As there was no obvious obstruction and labs were normal, GI is recommending outpatient follow-up for workup of her biliary duct dilation, patient has an history of cholecystectomy. Pain is thought to be due to musculoskeletal in origin.   3/26: Vital stable.  Labs seems stable with slight increase in creatinine to 1.14 which does not meet criteria for AKI, mild hypokalemia with potassium of 3.4, patient is on p.o. Lasix, holding today's dose of Lasix and replating potassium.   Labs for abnormal CBD dilation which include IgG4, anti-smooth muscle antibodies and mitochondrial antibodies are pending.  GI will follow-up those labs as outpatient.  They might consider doing an ERCP for further evaluation which will be done as outpatient.   Her current pain is most likely musculoskeletal as increased with moving.  No emergent need for any intervention.  She has been given pain medications and muscle relaxant with advised to be mindful about dizziness and constipation.   Patient was also recently given lidocaine patches by her PCP but apparently insurance refused to pay.  She was advised to get over-the-counter lidocaine patches if needed.   She will continue with her current medications with the addition of pain very send and muscle relaxant to be used as needed only.   Plan was discussed with patient and her fianc at bedside.   Patient will follow-up with gastroenterology as  an outpatient and with her primary care provider for further management.   Assessment and Plan: * RUQ abdominal pain Patient presenting with 2 to 3-week history of intermittent right upper quadrant abdominal pain with evidence of biliary dilation on CT and MRCP.  Pain is reproducible with certain position changes which is not quite consistent with biliary etiology.  LFTs are within normal limits.   - GI consulted; appreciate their recommendations - Pain control with trial of Robaxin  and Dilaudid - Continuous pulse oximetry while receiving opioids given chronic hypoxic respiratory failure   Dilation of biliary tract CT and MRCP obtained today with evidence of intrahepatic and common bile duct dilation.  Current pain is not completely consistent with biliary etiology, however patient will require workup of her biliary dilation.   - GI consulted; appreciate their recommendations - EUS and ERCP timing per GI - Repeat CMP in the a.m.   Essential hypertension Patient has a history of hypertension on multiple antihypertensives.  Blood pressure today is on the lower end of normal, likely due to IV opioid use for pain control.  She denies any symptoms including dizziness, chest pain or shortness of breath.   - Will hold her home antihypertensives today and monitor closely.  Will restart as able   CKD (chronic kidney disease) stage 3, GFR 30-59 ml/min (HCC) Renal function currently at baseline.  Will continue to monitor while admitted.   OSA on CPAP - CPAP at bedtime   Chronic heart failure with preserved ejection fraction (HCC) On examination, patient appears euvolemic.   - Continue home GDMT and Lasix - Continue home supplemental oxygen to maintain oxygen saturation above 88%   Type 2 diabetes mellitus treated with insulin (HCC) - Hold home antiglycemic agents with exception of Jardiance given that is for her HFrEF - SSI, moderate - Semglee 20 units at bedtime"  Hospital/Facility: [facility] D/C Physician: Dr. Nelson Chimes D/C Date: 04/30/22  Records Requested: 05/22/22 Records Received: 05/22/22 Records Reviewed: 05/22/22  Diagnoses on Discharge: RUQ Pain  Date of interactive Contact within 48 hours of discharge:  Contact was through: phone  Date of 7 day or 14 day face-to-face visit:   over 14 days  Outpatient Encounter Medications as of 05/22/2022  Medication Sig   albuterol (VENTOLIN HFA) 108 (90 Base) MCG/ACT inhaler INHALE 2 PUFFS INTO THE LUNGS EVERY 6 HOURS AS  NEEDED FOR WHEEZING OR SHORTNESS OF BREATH   amLODipine (NORVASC) 5 MG tablet TAKE 1 TABLET(5 MG) BY MOUTH DAILY   atenolol (TENORMIN) 50 MG tablet TAKE 1 TABLET(50 MG) BY MOUTH DAILY   atorvastatin (LIPITOR) 10 MG tablet TAKE 1 TABLET(10 MG) BY MOUTH DAILY   Blood Glucose Monitoring Suppl (CONTOUR NEXT MONITOR) w/Device KIT To check blood sugar 4 times daily.   Calcium Carbonate-Vitamin D (OYSTER SHELL CALCIUM 500 + D) 500-125 MG-UNIT TABS Take 1 tablet by mouth daily.    Cholecalciferol (VITAMIN D3) 10 MCG (400 UNIT) tablet Take 400 Units by mouth daily.   Dulaglutide (TRULICITY) 3 MG/0.5ML SOPN Inject 3 mg as directed once a week.   furosemide (LASIX) 40 MG tablet TAKE 1 TABLET(40 MG) BY MOUTH TWICE DAILY   glucose blood (CONTOUR NEXT TEST) test strip TEST BLOOD SUGAR ONCE DAILY   Insulin Glargine (BASAGLAR KWIKPEN) 100 UNIT/ML Inject 52 Units into the skin at bedtime.   Insulin Pen Needle 32G X 4 MM MISC 1 Units by Does not apply route every morning. Pen needles   Lancets (ONETOUCH ULTRASOFT) lancets Use as instructed  levonorgestrel (MIRENA) 20 MCG/24HR IUD 1 each by Intrauterine route once.    losartan (COZAAR) 25 MG tablet TAKE 1 TABLET(25 MG) BY MOUTH DAILY   OXYGEN Inhale 2 L into the lungs daily.   [DISCONTINUED] JARDIANCE 25 MG TABS tablet TAKE 1 TABLET(25 MG) BY MOUTH DAILY   HYDROcodone-acetaminophen (NORCO/VICODIN) 5-325 MG tablet Take 1 tablet by mouth every 6 (six) hours as needed for severe pain.   JARDIANCE 25 MG TABS tablet TAKE 1 TABLET(25 MG) BY MOUTH DAILY   [DISCONTINUED] amLODipine (NORVASC) 5 MG tablet TAKE 1 TABLET(5 MG) BY MOUTH DAILY   [DISCONTINUED] atenolol (TENORMIN) 50 MG tablet TAKE 1 TABLET(50 MG) BY MOUTH DAILY   [DISCONTINUED] cyclobenzaprine (FLEXERIL) 10 MG tablet Take 1 tablet (10 mg total) by mouth 3 (three) times daily as needed for muscle spasms.   [DISCONTINUED] HYDROcodone-acetaminophen (NORCO/VICODIN) 5-325 MG tablet Take 1 tablet by mouth every 6  (six) hours as needed for severe pain.   [DISCONTINUED] lidocaine (LIDODERM) 5 % Place 1 patch onto the skin daily. Remove & Discard patch within 12 hours or as directed by MD (Patient not taking: Reported on 04/29/2022)   No facility-administered encounter medications on file as of 05/22/2022.   Diagnostic Tests Reviewed/Disposition: reviewed within chart -- low CA+ and K+ noted -- rechecking all today.  Reviewed all imaging.  Consults: GI  Discharge Instructions:  Please follow-up for IgG4, anti-smooth muscle antibody and mitochondrial antibody results. Follow-up with gastroenterology Follow-up with primary care provider  Disease/illness Education: Reviewed with patient today.  Home Health/Community Services Discussions/Referrals: None  Establishment or re-establishment of referral orders for community resources: None  Discussion with other health care providers: Reviewed notes within chart  Assessment and Support of treatment regimen adherence: Reviewed with patient today.  Appointments Coordinated with: Reviewed with patient today.  Education for self-management, independent living, and ADLs:  Reviewed with patient today.  DIABETES Current medications include Jardiance 25 MG, Basaglar 52 units QHS, and Trulicity 3 MG weekly.  A1c in December was 7.7%, she has not returned to endo as instructed.  Last saw endocrinology 08/14/21 (Dr. Huntley Dec), with no changes made.  She does endorse poor diet on occasion.   Continues on Vitamin D and B12 supplements daily. Polydipsia/polyuria: no Visual disturbance: no Chest pain: no Paresthesias: no Glucose Monitoring: yes             Accucheck frequency: BID             Fasting glucose: 90 to 120 range             Evening: <200 range, last night 145 -- sometimes does not take night time insulin if sugars are this range             Before meals: Taking Insulin?: yes             Long acting insulin: 52 units             Short  acting insulin: Blood Pressure Monitoring: not checking Retinal Examination: Up to Date -- [facility], Dr. Inez Pilgrim Foot Exam: Up to Date Pneumovax: Up to Date Influenza: Not Up to Date -- refuses Aspirin: no    HYPERTENSION / HYPERLIPIDEMIA/HF Current medications include Losartan (no Lisinopril due to cough), Furosemide, Amlodipine, Atenolol for HTN + Lipitor for HLD.     Followed by cardiology and last saw 03/29/22.  Last echo in 2020 October was 55-60%. Satisfied with current treatment? yes Duration of hypertension: chronic BP monitoring frequency: not checking  BP range:  BP medication side effects: no Duration of hyperlipidemia: chronic Cholesterol medication side effects: no Cholesterol supplements: none Medication compliance: good compliance Aspirin: no Recent stressors: no Recurrent headaches: no Visual changes: no Palpitations: no Dyspnea: no Chest pain: no Lower extremity edema: no Dizzy/lightheaded: no   CHRONIC KIDNEY DISEASE CKD status: stable Medications renally dose: yes Previous renal evaluation: no Pneumovax:  Up to Date Influenza Vaccine:  Not up to Date    ASTHMA Saw Dr. Sherene Sires with pulmonary last in 11/28/20 -- continues to use O2 every night.  No current inhaler regimen. Asthma status: stable Satisfied with current treatment?: yes Albuterol/rescue inhaler frequency: none Dyspnea frequency: no Wheezing frequency: no Cough frequency: no Nocturnal symptom frequency: no Limitation of activity: no Current upper respiratory symptoms: no Triggers: enviromental Aerochamber/spacer use: no Visits to ER or Urgent Care in past year: no Pneumovax: Up to Date Influenza:  refuses    Relevant past medical, surgical, family and social history reviewed and updated as indicated. Interim medical history since our last visit reviewed. Allergies and medications reviewed and updated.  Review of Systems  Constitutional:  Negative for activity change, appetite  change, diaphoresis, fatigue and fever.  Respiratory:  Negative for cough, chest tightness, shortness of breath and wheezing.   Cardiovascular:  Negative for chest pain, palpitations and leg swelling.  Gastrointestinal:  Positive for abdominal pain. Negative for abdominal distention, blood in stool, constipation, diarrhea, nausea and vomiting.  Endocrine: Negative for cold intolerance, heat intolerance, polydipsia, polyphagia and polyuria.  Neurological: Negative.   Psychiatric/Behavioral: Negative.      Per HPI unless specifically indicated above     Objective:    BP 109/74   Pulse 77   Temp 98 F (36.7 C) (Oral)   Ht 5' 7.99" (1.727 m)   Wt (!) 347 lb 9.6 oz (157.7 kg)   SpO2 94%   BMI 52.86 kg/m   Wt Readings from Last 3 Encounters:  05/22/22 (!) 347 lb 9.6 oz (157.7 kg)  04/29/22 (!) 362 lb 10.5 oz (164.5 kg)  04/11/22 (!) 360 lb (163.3 kg)    Physical Exam Vitals and nursing note reviewed.  Constitutional:      General: She is awake. She is not in acute distress.    Appearance: She is well-developed and well-groomed. She is morbidly obese. She is not ill-appearing or toxic-appearing.  HENT:     Head: Normocephalic.     Right Ear: Hearing and external ear normal.     Left Ear: Hearing and external ear normal.  Eyes:     General: Lids are normal. No scleral icterus.       Right eye: No discharge.        Left eye: No discharge.     Conjunctiva/sclera: Conjunctivae normal.     Pupils: Pupils are equal, round, and reactive to light.  Neck:     Thyroid: No thyromegaly.     Vascular: No carotid bruit.  Cardiovascular:     Rate and Rhythm: Normal rate and regular rhythm.     Heart sounds: Normal heart sounds. No murmur heard.    No gallop.  Pulmonary:     Effort: Pulmonary effort is normal. No accessory muscle usage or respiratory distress.     Breath sounds: Normal breath sounds. No decreased breath sounds, wheezing or rhonchi.  Abdominal:     General: Abdomen is  protuberant. Bowel sounds are normal. There is no distension.     Palpations: Abdomen is soft.  Tenderness: There is no abdominal tenderness. There is no right CVA tenderness, left CVA tenderness, guarding or rebound. Negative signs include Murphy's sign, Rovsing's sign, McBurney's sign and psoas sign.     Comments: No tenderness to abdomen today.  Musculoskeletal:     Cervical back: Normal range of motion and neck supple.     Right lower leg: Edema (trace) present.     Left lower leg: Edema (trace) present.  Lymphadenopathy:     Cervical: No cervical adenopathy.  Skin:    General: Skin is warm and dry.     Capillary Refill: Capillary refill takes less than 2 seconds.  Neurological:     Mental Status: She is alert and oriented to person, place, and time.  Psychiatric:        Attention and Perception: Attention normal.        Mood and Affect: Mood and affect normal.        Speech: Speech normal.        Behavior: Behavior normal. Behavior is cooperative.        Thought Content: Thought content normal.    Results for orders placed or performed during the hospital encounter of 04/29/22  Lipase, blood  Result Value Ref Range   Lipase 31 11 - 51 U/L  Comprehensive metabolic panel  Result Value Ref Range   Sodium 138 135 - 145 mmol/L   Potassium 3.8 3.5 - 5.1 mmol/L   Chloride 98 98 - 111 mmol/L   CO2 29 22 - 32 mmol/L   Glucose, Bld 108 (H) 70 - 99 mg/dL   BUN 18 6 - 20 mg/dL   Creatinine, Ser 1.61 0.44 - 1.00 mg/dL   Calcium 8.4 (L) 8.9 - 10.3 mg/dL   Total Protein 7.8 6.5 - 8.1 g/dL   Albumin 3.4 (L) 3.5 - 5.0 g/dL   AST 13 (L) 15 - 41 U/L   ALT 14 0 - 44 U/L   Alkaline Phosphatase 88 38 - 126 U/L   Total Bilirubin 1.0 0.3 - 1.2 mg/dL   GFR, Estimated >09 >60 mL/min   Anion gap 11 5 - 15  CBC  Result Value Ref Range   WBC 8.5 4.0 - 10.5 K/uL   RBC 4.73 3.87 - 5.11 MIL/uL   Hemoglobin 12.5 12.0 - 15.0 g/dL   HCT 45.4 09.8 - 11.9 %   MCV 87.9 80.0 - 100.0 fL   MCH  26.4 26.0 - 34.0 pg   MCHC 30.0 30.0 - 36.0 g/dL   RDW 14.7 82.9 - 56.2 %   Platelets 331 150 - 400 K/uL   nRBC 0.0 0.0 - 0.2 %  Mitochondrial antibodies  Result Value Ref Range   Mitochondrial M2 Ab, IgG <20.0 0.0 - 20.0 Units  Anti-smooth muscle antibody, IgG  Result Value Ref Range   F-Actin IgG 12 0 - 19 Units  HIV Antibody (routine testing w rflx)  Result Value Ref Range   HIV Screen 4th Generation wRfx Non Reactive Non Reactive  Comprehensive metabolic panel  Result Value Ref Range   Sodium 135 135 - 145 mmol/L   Potassium 3.4 (L) 3.5 - 5.1 mmol/L   Chloride 102 98 - 111 mmol/L   CO2 28 22 - 32 mmol/L   Glucose, Bld 151 (H) 70 - 99 mg/dL   BUN 27 (H) 6 - 20 mg/dL   Creatinine, Ser 1.30 (H) 0.44 - 1.00 mg/dL   Calcium 7.8 (L) 8.9 - 10.3 mg/dL   Total Protein 7.1  6.5 - 8.1 g/dL   Albumin 3.2 (L) 3.5 - 5.0 g/dL   AST 13 (L) 15 - 41 U/L   ALT 12 0 - 44 U/L   Alkaline Phosphatase 76 38 - 126 U/L   Total Bilirubin 0.8 0.3 - 1.2 mg/dL   GFR, Estimated 58 (L) >60 mL/min   Anion gap 5 5 - 15  CBC  Result Value Ref Range   WBC 8.1 4.0 - 10.5 K/uL   RBC 4.54 3.87 - 5.11 MIL/uL   Hemoglobin 11.7 (L) 12.0 - 15.0 g/dL   HCT 53.6 64.4 - 03.4 %   MCV 86.3 80.0 - 100.0 fL   MCH 25.8 (L) 26.0 - 34.0 pg   MCHC 29.8 (L) 30.0 - 36.0 g/dL   RDW 74.2 59.5 - 63.8 %   Platelets 288 150 - 400 K/uL   nRBC 0.0 0.0 - 0.2 %  IgG 4  Result Value Ref Range   IgG, Subclass - 96 mg/dL  Glucose, capillary  Result Value Ref Range   Glucose-Capillary 99 70 - 99 mg/dL  Glucose, capillary  Result Value Ref Range   Glucose-Capillary 159 (H) 70 - 99 mg/dL  Glucose, capillary  Result Value Ref Range   Glucose-Capillary 107 (H) 70 - 99 mg/dL      Assessment & Plan:   Problem List Items Addressed This Visit       Cardiovascular and Mediastinum   Benign hypertensive heart and kidney disease with CHF and stage 2 chronic kidney disease    Chronic, stable.  BP below goal today.   Continue current medication regimen and collaboration with cardiology.  Recommend checking BP three mornings a week at home + documenting for providers. Education and recommendation provided today on DASH diet focus and avoidance of high sodium foods, and cutting back on pork skins, which patient states is difficult to do.  DASH diet focus.  LABS: CMP.  Return in 3 months.      Relevant Orders   Microalbumin, Urine Waived   Comprehensive metabolic panel   Chronic heart failure with preserved ejection fraction    Chronic, ongoing with BP at goal.  Euvolemic today.  Continue current medication regimen and collaboration with cardiology.  Recommend continued focus on diet and salt reduction.  Recommend: - Reminded to call for an overnight weight gain of >2 pounds or a weekly weight weight of >5 pounds - not adding salt to his food and has been reading food labels. Reviewed the importance of keeping daily sodium intake to 2000mg  daily -- NO PORK SKINS - Avoid Ibuprofen      Relevant Orders   Comprehensive metabolic panel   NICM (nonischemic cardiomyopathy)    Chronic, ongoing.  Continue collaboration with cardiology and current medication regimen as prescribed by them.        Respiratory   Cough variant asthma vs Upper Airway cough syndrome    Chronic, stable at this time without inhalers. Continue O2 2L at night.        OSA on CPAP    Chronic, ongoing.  Continue current use of O2 at night. She has no concerns today.        Endocrine   Hyperlipidemia associated with type 2 diabetes mellitus    Chronic, ongoing.  Continue current medication regimen as is at goal.  Lipid panel today.      Relevant Medications   JARDIANCE 25 MG TABS tablet   Other Relevant Orders   [facility]  Hb A1c Waived   Comprehensive metabolic panel   Lipid Panel w/o Chol/HDL Ratio   Proteinuria due to type 2 diabetes mellitus    Chronic, ongoing.  Followed by endo.  A1c today 7.4%, trend down, and urine ALB   47 July 2023. Continue collaboration with endocrinology, recommend she schedule with them.  Continue current medication regimen as ordered by them, however consider increase in Trulicity to 4.5 MG weekly next visit and reduction of insulin if supply Trulicity improves -- discussed with patient.  Could consider switch to [facility] in future, Losartan offering kidney protection.  Recommend she continue to monitor BS at home 2-3 times a day.  Refills as needed.  Return in 3 months. - Maintain Statin and ARB - Eye and Foot exam up to date - Refuses flu vaccine, remainder of vaccines up to date.      Relevant Medications   JARDIANCE 25 MG TABS tablet   Other Relevant Orders   [facility] Hb A1c Waived   Microalbumin, Urine Waived   Comprehensive metabolic panel   Type 2 diabetes mellitus treated with insulin - Primary    Chronic, ongoing.  Followed by endo.  A1c today 7.4%, trend down, and urine ALB  47 July 2023. Continue collaboration with endocrinology, recommend she schedule with them.  Continue current medication regimen as ordered by them, however consider increase in Trulicity to 4.5 MG weekly next visit and reduction of insulin if supply Trulicity improves -- discussed with patient.  Could consider switch to [facility] in future, Losartan offering kidney protection.  Recommend she continue to monitor BS at home 2-3 times a day.  Refills as needed.  Return in 3 months.      Relevant Medications   JARDIANCE 25 MG TABS tablet   Other Relevant Orders   [facility] DCA Hb A1c Waived   Microalbumin, Urine Waived     Genitourinary   CKD (chronic kidney disease) stage 3, GFR 30-59 ml/min    Chronic, ongoing.  Continue to monitor closely.  Maintain ARB on board for protection.  Avoid Ibuprofen products.      Relevant Orders   Comprehensive metabolic panel     Other   BMI 50.0-59.9, adult    Refer to morbid obesity plan of care.      Relevant Medications   JARDIANCE 25 MG TABS tablet    Hypocalcemia    Recheck on labs today and recommend intake of calcium rich foods + calcium supplement daily.  Check PTH on labs today and CMP.      Morbid obesity    BMI 52.86 with diabetes, HTN, HF.  Recommended eating smaller high protein, low fat meals more frequently and exercising 30 mins a day 5 times a week with a goal of 10-15lb weight loss in the next 3 months. Patient voiced their understanding and motivation to adhere to these recommendations.       Relevant Medications   JARDIANCE 25 MG TABS tablet   RUQ abdominal pain    Ongoing with some improvement.  Will refill Norco today for short burst until she can visit with GI next week.  Appreciate their input on this more complex case.  She is aware to take Norco only minimally.      Vitamin D deficiency    Chronic, ongoing.  Continue supplement and recheck Vit D in office today.      Relevant Orders   VITAMIN D 25 Hydroxy (Vit-D Deficiency, Fractures)   PTH, intact and calcium  Other Visit Diagnoses     Hypokalemia       Recheck on labs today.   Encounter for screening mammogram for malignant neoplasm of breast       Mammogram ordered and instructed on how to obtain.   Relevant Orders   MM 3D SCREENING MAMMOGRAM BILATERAL BREAST        Follow up plan: Return in about 3 months (around 08/21/2022) for T2DM, HTN/HLD, ASTHMA, CHF.

## 2022-05-22 NOTE — Assessment & Plan Note (Signed)
BMI 52.86 with diabetes, HTN, HF.  Recommended eating smaller high protein, low fat meals more frequently and exercising 30 mins a day 5 times a week with a goal of 10-15lb weight loss in the next 3 months. Patient voiced their understanding and motivation to adhere to these recommendations.

## 2022-05-22 NOTE — Assessment & Plan Note (Addendum)
Chronic, ongoing.  Followed by endo.  A1c today 7.4%, trend down, and urine ALB  47 July 2023. Continue collaboration with endocrinology, recommend she schedule with them.  Continue current medication regimen as ordered by them, however consider increase in Trulicity to 4.5 MG weekly next visit and reduction of insulin if supply Trulicity improves -- discussed with patient.  Could consider switch to [facility] in future, Losartan offering kidney protection.  Recommend she continue to monitor BS at home 2-3 times a day.  Refills as needed.  Return in 3 months. - Maintain Statin and ARB - Eye and Foot exam up to date - Refuses flu vaccine, remainder of vaccines up to date.

## 2022-05-22 NOTE — Assessment & Plan Note (Signed)
Refer to morbid obesity plan of care. 

## 2022-05-23 LAB — COMPREHENSIVE METABOLIC PANEL
ALT: 14 IU/L (ref 0–32)
AST: 13 IU/L (ref 0–40)
Albumin/Globulin Ratio: 1.4 (ref 1.2–2.2)
Albumin: 4.2 g/dL (ref 3.8–4.9)
Alkaline Phosphatase: 125 IU/L — ABNORMAL HIGH (ref 44–121)
BUN/Creatinine Ratio: 16 (ref 9–23)
BUN: 21 mg/dL (ref 6–24)
Bilirubin Total: 0.3 mg/dL (ref 0.0–1.2)
CO2: 26 mmol/L (ref 20–29)
Calcium: 8.6 mg/dL — ABNORMAL LOW (ref 8.7–10.2)
Chloride: 100 mmol/L (ref 96–106)
Creatinine, Ser: 1.31 mg/dL — ABNORMAL HIGH (ref 0.57–1.00)
Globulin, Total: 2.9 g/dL (ref 1.5–4.5)
Glucose: 189 mg/dL — ABNORMAL HIGH (ref 70–99)
Potassium: 4.6 mmol/L (ref 3.5–5.2)
Sodium: 142 mmol/L (ref 134–144)
Total Protein: 7.1 g/dL (ref 6.0–8.5)
eGFR: 49 mL/min/{1.73_m2} — ABNORMAL LOW (ref >59)

## 2022-05-23 LAB — LIPID PANEL W/O CHOL/HDL RATIO
Cholesterol, Total: 114 mg/dL (ref 100–199)
HDL: 42 mg/dL (ref >39)
LDL Chol Calc (NIH): 56 mg/dL (ref 0–99)
Triglycerides: 77 mg/dL (ref 0–149)
VLDL Cholesterol Cal: 16 mg/dL (ref 5–40)

## 2022-05-23 LAB — PTH, INTACT AND CALCIUM: PTH: 48 pg/mL (ref 15–65)

## 2022-05-23 LAB — VITAMIN D 25 HYDROXY (VIT D DEFICIENCY, FRACTURES): Vit D, 25-Hydroxy: 24.8 ng/mL — ABNORMAL LOW (ref 30.0–100.0)

## 2022-05-23 NOTE — Progress Notes (Signed)
Contacted via MyChart   Good afternoon Sara Zhang, your labs have returned: - Kidney function, creatinine and eGFR, continues to show stage 3a kidney disease on this check.  This can be related to diabetes and heart health.  It may also explain lower calcium level, although this is trending up to normal this check -- we will continue to monitor and watch calcium intake at home.   - Liver function, AST and ALT, is normal. - Vitamin D remains low, I do recommend you take Vitamin D3 2000 units daily which is good for bone health. - Remainder of labs all stable.  Any questions? Keep being amazing!!  Thank you for allowing me to participate in your care.  I appreciate you. Kindest regards, Christian Borgerding

## 2022-05-27 ENCOUNTER — Ambulatory Visit: Payer: Medicare Other | Admitting: Podiatry

## 2022-06-04 ENCOUNTER — Ambulatory Visit (INDEPENDENT_AMBULATORY_CARE_PROVIDER_SITE_OTHER): Payer: Medicare Other | Admitting: Gastroenterology

## 2022-06-04 ENCOUNTER — Encounter: Payer: Self-pay | Admitting: Gastroenterology

## 2022-06-04 VITALS — BP 136/85 | HR 77 | Temp 98.7°F | Ht 69.0 in | Wt 350.0 lb

## 2022-06-04 DIAGNOSIS — K838 Other specified diseases of biliary tract: Secondary | ICD-10-CM | POA: Diagnosis not present

## 2022-06-04 NOTE — Progress Notes (Signed)
Gastroenterology Consultation  Referring Provider:     Marjie Skiff, NP Primary Care Physician:  Marjie Skiff, NP Primary Gastroenterologist:  Dr. Servando Snare     Reason for Consultation:     Dilated, bile duct        HPI:   Sara Zhang is a 52 y.o. y/o female referred for consultation & management of dilated common bile duct by Dr. Marjie Skiff, NP.  This patient comes in today with a history of being in the hospital with right upper quadrant abdominal pain and had an MRI of the abdomen that showed:  IMPRESSION: 1. As reported by prior CT, there is segmental biliary ductal dilatation of the left lobe of the liver, involving hepatic segments II and III. Atrophy of the left lobe, this overall morphology similar to prior examination dated 05/16/2011, however biliary ductal dilatation is new. There is no directly visualized lesion or other abnormality within the central left lobe of the liver of the nidus of ductal dilatation. Differential considerations include stricture and very small occult mass such as cholangiocarcinoma. Consider ERCP for further evaluation. 2. Status post cholecystectomy, with postoperative dilatation of the common bile duct up to 1.4 cm in caliber. No obstructing calculus or other lesion identified to the ampulla. No right-sided intrahepatic biliary ductal dilatation.   I was contacted by Dr. Timothy Lasso who was taking care of the patient while she was in the hospital and was noted to have this biliary ductal dilation with normal LFTs at that time.  As noted in the MRI there was atrophy of the left lobe with a new finding of ductal dilatation of 1.4 cm of the common bile duct.  The patient had a negative IgG4 and antimitochondrial antibody.  Although her alkaline phosphatase had been normal previously it had gone up to 125 on the last blood test 2 weeks ago. The patient reports that she has intermittent pain in the right shoulder.  She also reports that she  had her gallbladder out in the past.  There is no report of any unexplained weight loss.  Past Medical History:  Diagnosis Date   Abnormal uterine bleeding (AUB) 11/25/2016   Chronic diastolic CHF (congestive heart failure) (HCC)    a. 02/2012 Echo: EF 25-35%, glob HK; b. 06/2016 Echo: EF 55-60%, mild MR, nl PASP.   CKD (chronic kidney disease), stage II    COVID-19    Diabetes mellitus without complication (HCC)    Extreme obesity    Hyperlipidemia    Hypertension    Hypoxia    Microcytic anemia    Pneumonia    Pneumonia    Sleep apnea    Status post endometrial ablation 06/20/2016   2015 hysteroscopy/D&C with NovaSure endometrial ablation; pathology-benign endometrial polyps   Thickened endometrium 06/20/2016   Ultrasound-22.3 mm endometrial stripe, heterogenous   Vaginal bleeding    a. 01/2017 s/p hyteroscopy and polypectomy [facility]   Vitamin D deficiency     Past Surgical History:  Procedure Laterality Date   CESAREAN SECTION     CHOLECYSTECTOMY     DILATION AND CURETTAGE, DIAGNOSTIC / THERAPEUTIC     TONSILLECTOMY AND ADENOIDECTOMY     TOOTH EXTRACTION  01/2016    Prior to Admission medications   Medication Sig Start Date End Date Taking? Authorizing Provider  albuterol (VENTOLIN HFA) 108 (90 Base) MCG/ACT inhaler INHALE 2 PUFFS INTO THE LUNGS EVERY 6 HOURS AS NEEDED FOR WHEEZING OR SHORTNESS OF BREATH 04/01/22   Cannady,  Jolene T, NP  amLODipine (NORVASC) 5 MG tablet TAKE 1 TABLET(5 MG) BY MOUTH DAILY 05/20/22   End, Cristal Deer, MD  atenolol (TENORMIN) 50 MG tablet TAKE 1 TABLET(50 MG) BY MOUTH DAILY 05/20/22   Cannady, Jolene T, NP  atorvastatin (LIPITOR) 10 MG tablet TAKE 1 TABLET(10 MG) BY MOUTH DAILY 02/18/22   Cannady, Jolene T, NP  Blood Glucose Monitoring Suppl (CONTOUR NEXT MONITOR) w/Device KIT To check blood sugar 4 times daily. 12/22/19   Cannady, Corrie Dandy T, NP  Calcium Carbonate-Vitamin D (OYSTER SHELL CALCIUM 500 + D) 500-125 MG-UNIT TABS Take 1 tablet by mouth  daily.     [provider]  Cholecalciferol (VITAMIN D3) 10 MCG (400 UNIT) tablet Take 400 Units by mouth daily.    [provider]  Dulaglutide (TRULICITY) 3 MG/0.5ML SOPN Inject 3 mg as directed once a week. 01/21/22   Cannady, Corrie Dandy T, NP  furosemide (LASIX) 40 MG tablet TAKE 1 TABLET(40 MG) BY MOUTH TWICE DAILY 02/19/22   End, Cristal Deer, MD  glucose blood (CONTOUR NEXT TEST) test strip TEST BLOOD SUGAR ONCE DAILY 01/09/18   Cannady, Corrie Dandy T, NP  HYDROcodone-acetaminophen (NORCO/VICODIN) 5-325 MG tablet Take 1 tablet by mouth every 6 (six) hours as needed for severe pain. 05/22/22   Cannady, Corrie Dandy T, NP  Insulin Glargine (BASAGLAR KWIKPEN) 100 UNIT/ML Inject 52 Units into the skin at bedtime. 07/30/21   [provider]  Insulin Pen Needle 32G X 4 MM MISC 1 Units by Does not apply route every morning. Pen needles 08/21/16   Gabriel Cirri, NP  JARDIANCE 25 MG TABS tablet TAKE 1 TABLET(25 MG) BY MOUTH DAILY 05/22/22   Aura Dials T, NP  Lancets (ONETOUCH ULTRASOFT) lancets Use as instructed 06/05/20   Aura Dials T, NP  levonorgestrel (MIRENA) 20 MCG/24HR IUD 1 each by Intrauterine route once.     [provider]  losartan (COZAAR) 25 MG tablet TAKE 1 TABLET(25 MG) BY MOUTH DAILY 11/20/21   Cannady, Jolene T, NP  OXYGEN Inhale 2 L into the lungs daily.    [provider]    Family History  Problem Relation Age of Onset   Diabetes Mother    Hyperlipidemia Mother    Hypertension Mother    Stroke Mother    Arthritis Father    Asthma Father    Diabetes Father    Hypertension Father    Hypertension Brother    Diabetes Brother    Diabetes Maternal Grandmother    Hypertension Maternal Grandmother    Diabetes Maternal Grandfather    Hypertension Maternal Grandfather    Diabetes Paternal Grandmother    Hypertension Paternal Grandmother    Heart attack Maternal Uncle    Breast cancer Neg Hx      Social History   Tobacco Use   Smoking  status: Never   Smokeless tobacco: Never  Vaping Use   Vaping Use: Never used  Substance Use Topics   Alcohol use: Yes    Alcohol/week: 0.0 standard drinks of alcohol    Comment: 1-2 beers/month   Drug use: No    Allergies as of 06/04/2022   (No Known Allergies)    Review of Systems:    All systems reviewed and negative except where noted in HPI.   Physical Exam:  There were no vitals taken for this visit. No LMP recorded. Patient has had an ablation. General:   Alert,  Well-developed, well-nourished, pleasant and cooperative in NAD Head:  Normocephalic and atraumatic. Eyes:  Sclera  clear, no icterus.   Conjunctiva pink. Ears:  Normal auditory acuity. Neck:  Supple; no masses or thyromegaly. Extremities:  No clubbing or edema.  No cyanosis. Neurologic:  Alert and oriented x3;  grossly normal neurologically. Skin:  Intact without significant lesions or rashes.  No jaundice. Lymph Nodes:  No significant cervical adenopathy. Psych:  Alert and cooperative. Normal mood and affect.  Imaging Studies: No results found.  Assessment and Plan:   Sara Zhang is a 52 y.o. y/o female who comes in with a left hepatic lobe atrophy with a dilated common bile duct which is new from her previous imaging.  The patient will have her blood sent off for CA 19-9.  The patient will also be set up for an ERCP to rule out any obstructive lesion or malignancy.  The patient has been explained the plan including the risk of pancreatitis infection, bleeding and death.  The patient has been explained the plan and agrees with it.    Midge Minium, MD. Clementeen Graham    Note: This dictation was prepared with Dragon dictation along with smaller phrase technology. Any transcriptional errors that result from this process are unintentional.

## 2022-06-05 ENCOUNTER — Telehealth: Payer: Self-pay | Admitting: *Deleted

## 2022-06-05 ENCOUNTER — Telehealth: Payer: Self-pay

## 2022-06-05 DIAGNOSIS — Z0389 Encounter for observation for other suspected diseases and conditions ruled out: Secondary | ICD-10-CM

## 2022-06-05 NOTE — Telephone Encounter (Signed)
Left message for the patient to contact the office. 

## 2022-06-05 NOTE — Telephone Encounter (Signed)
Sent via cardiology pool

## 2022-06-05 NOTE — Telephone Encounter (Signed)
-----   Message from Gaston Islam., NP sent at 06/05/2022  1:04 PM EDT ----- 06/05/2022  Patient Name: Sara Zhang  DOB: December 09, 1970  To whom it may concern:  [name] has been scheduled for an ERCP on Jun 27, 2022, under GENERAL anesthesia.  Please FAX a note of Medical Clearance to [phone], ATTENTION: MELANIE  To advise if this patient will require an office visit or further medical work-up before clearance can be given please call [phone] (main number) and leave a message with the triage nurse.  Thank you    [facility]   _____ Patient is cleared to have procedure   Additional notes:_________________________________________________________  ______________________________________________________________________   _______________________________________ ________________ Physician Signature     Date

## 2022-06-05 NOTE — Telephone Encounter (Signed)
   Name: Sara Zhang  DOB: [birth year]  MRN: [id number]  Primary Cardiologist: Yvonne Kendall, MD   Preoperative team, please contact this patient and set up a phone call appointment for further preoperative risk assessment. Please obtain consent and complete medication review. Thank you for your help.  I confirm that guidance regarding antiplatelet and oral anticoagulation therapy has been completed and, if necessary, noted below.  None requested   Napoleon Form, Leodis Rains, NP 06/05/2022, 1:37 PM [facility]

## 2022-06-05 NOTE — Telephone Encounter (Signed)
   Pre-operative Risk Assessment    Patient Name: Sara Zhang  DOB: 03-12-1970 MRN: [id number]      Request for Surgical Clearance    Procedure:   ERCP  Date of Surgery:  Clearance 06/27/22                                 Surgeon:  DR. DARREN WOHL Surgeon's Group or Practice Name:  [facility] Phone number:  [phone] Fax number:  [phone] ATTN: MELANIE   Type of Clearance Requested:   - Medical ; NO MEDICATIONS LISTED AS NEEDING TO BE HELD   Type of Anesthesia:  General    Additional requests/questions:    Elpidio Anis   06/05/2022, 1:15 PM

## 2022-06-06 DIAGNOSIS — Z0389 Encounter for observation for other suspected diseases and conditions ruled out: Secondary | ICD-10-CM | POA: Diagnosis not present

## 2022-06-06 NOTE — Telephone Encounter (Signed)
Left message to call back to sched tele appt for pre op

## 2022-06-07 LAB — CANCER ANTIGEN 19-9: CA 19-9: 11 U/mL (ref 0–35)

## 2022-06-07 NOTE — Telephone Encounter (Signed)
Our office has tried x 3 to reach the pt to set up tele pre op appt. I will update the requesting office pt will need a tele appt before she can be cleared. I will remove from the pool at this time.

## 2022-06-10 ENCOUNTER — Telehealth: Payer: Self-pay | Admitting: *Deleted

## 2022-06-10 ENCOUNTER — Telehealth: Payer: Self-pay

## 2022-06-10 NOTE — Progress Notes (Cosign Needed)
Care Management & Coordination Services Pharmacy Team  Reason for Encounter: Diabetes  Contacted patient to discuss diabetes disease state. Unsuccessful outreach. Left voicemail for patient to return call.   Recent office visits:  05/22/22-Jolene T. Despina Hidden, NP (PCP) Seen for hospital follow up visit. Labs ordered. Mammogram ordered. Follow up visit in 3 months.  04/11/22-Jolene T. Despina Hidden, NP (PCP) Seen for abdominal pain. Start on tizanidine and lidocaine patches for pain and Flagyl 500 mg for 7 days. Follow up on 04/29/22.  Recent consult visits:  06/04/22-Darren Servando Snare, MD (Gastroenterology) Seen for abdominal pain.  03/29/22-Christopher End, MD (Cardiology) Seen for heart failure. Follow up in 1 year. 03/25/22-Saadat Crista Luria, MD (Pulmonology) Seen for obstructive sleep apnea visit.   Hospital visits:  Patient was seen in [facility] on 04/29/22-04/30/22 for abdominal pain. No new medication started nor medications discontinued.   Medications: Outpatient Encounter Medications as of 06/10/2022  Medication Sig   albuterol (VENTOLIN HFA) 108 (90 Base) MCG/ACT inhaler INHALE 2 PUFFS INTO THE LUNGS EVERY 6 HOURS AS NEEDED FOR WHEEZING OR SHORTNESS OF BREATH   amLODipine (NORVASC) 5 MG tablet TAKE 1 TABLET(5 MG) BY MOUTH DAILY   atenolol (TENORMIN) 50 MG tablet TAKE 1 TABLET(50 MG) BY MOUTH DAILY   atorvastatin (LIPITOR) 10 MG tablet TAKE 1 TABLET(10 MG) BY MOUTH DAILY   Blood Glucose Monitoring Suppl (CONTOUR NEXT MONITOR) w/Device KIT To check blood sugar 4 times daily.   Calcium Carbonate-Vitamin D (OYSTER SHELL CALCIUM 500 + D) 500-125 MG-UNIT TABS Take 1 tablet by mouth daily.    Cholecalciferol (VITAMIN D3) 10 MCG (400 UNIT) tablet Take 400 Units by mouth daily.   Dulaglutide (TRULICITY) 3 MG/0.5ML SOPN Inject 3 mg as directed once a week.   furosemide (LASIX) 40 MG tablet TAKE 1 TABLET(40 MG) BY MOUTH TWICE DAILY   glucose blood (CONTOUR NEXT TEST) test strip TEST BLOOD SUGAR  ONCE DAILY   HYDROcodone-acetaminophen (NORCO/VICODIN) 5-325 MG tablet Take 1 tablet by mouth every 6 (six) hours as needed for severe pain.   Insulin Glargine (BASAGLAR KWIKPEN) 100 UNIT/ML Inject 52 Units into the skin at bedtime.   Insulin Pen Needle 32G X 4 MM MISC 1 Units by Does not apply route every morning. Pen needles   JARDIANCE 25 MG TABS tablet TAKE 1 TABLET(25 MG) BY MOUTH DAILY   Lancets (ONETOUCH ULTRASOFT) lancets Use as instructed   levonorgestrel (MIRENA) 20 MCG/24HR IUD 1 each by Intrauterine route once.    losartan (COZAAR) 25 MG tablet TAKE 1 TABLET(25 MG) BY MOUTH DAILY   OXYGEN Inhale 2 L into the lungs daily.   No facility-administered encounter medications on file as of 06/10/2022.    Recent Relevant Labs: Lab Results  Component Value Date/Time   HGBA1C 7.4 (H) 05/22/2022 02:02 PM   HGBA1C 7.7 (H) 01/21/2022 08:15 AM   MICROALBUR 10 05/22/2022 02:02 PM   MICROALBUR 80 (H) 03/27/2021 09:16 AM    Kidney Function Lab Results  Component Value Date/Time   CREATININE 1.31 (H) 05/22/2022 02:04 PM   CREATININE 1.14 (H) 04/30/2022 06:42 AM   CREATININE 0.75 09/25/2013 02:00 PM   CREATININE 0.89 06/17/2013 11:51 AM   GFRNONAA 58 (L) 04/30/2022 06:42 AM   GFRNONAA >60 09/25/2013 02:00 PM   GFRAA 61 03/08/2020 09:29 AM   GFRAA >60 09/25/2013 02:00 PM   Current antihyperglycemic regimen:  Trulicity 3 mg inject 23m weekly Basaglar 100 units inject 52 units at bedtime Jardiance 25 mg take 1 tablet daily   Adherence  Review: Is the patient currently on a STATIN medication? Yes Is the patient currently on ACE/ARB medication? Yes Does the patient have >5 day gap between last estimated fill dates? No  Star Rating Drugs:  Atorvastatin 10 mg Last filled:02/18/22 90 DS, 05/20/22 90 DS Trulicity 3mg  Last filled:12/10/21 84, 02/27/22 84 DS Basaglar kwikpen 100 units Last filled:03/06/22 83 DS, 05/27/22 82 DS Losartan 25 mg Last filled:02/22/22 90 DS, 05/20/22 90  DS  Care Gaps: Annual wellness visit in last year? Yes 09/24/21 Last eye exam / retinopathy screening:11/07/023 Last diabetic foot exam:01/21/22   Rance Muir, RMA

## 2022-06-10 NOTE — Telephone Encounter (Signed)
Pt has been scheduled for tele pre op appt 06/14/22 @ 9:20. Med rec and consent are done.     Patient Consent for Virtual Visit        Sara Zhang has provided verbal consent on 06/10/2022 for a virtual visit (video or telephone).   CONSENT FOR VIRTUAL VISIT FOR:  Sara Zhang  By participating in this virtual visit I agree to the following:  I hereby voluntarily request, consent and authorize [facility] and its employed or contracted physicians, physician assistants, nurse practitioners or other licensed health care professionals (the Practitioner), to provide me with telemedicine health care services (the "Services") as deemed necessary by the treating Practitioner. I acknowledge and consent to receive the Services by the Practitioner via telemedicine. I understand that the telemedicine visit will involve communicating with the Practitioner through live audiovisual communication technology and the disclosure of certain medical information by electronic transmission. I acknowledge that I have been given the opportunity to request an in-person assessment or other available alternative prior to the telemedicine visit and am voluntarily participating in the telemedicine visit.  I understand that I have the right to withhold or withdraw my consent to the use of telemedicine in the course of my care at any time, without affecting my right to future care or treatment, and that the Practitioner or I may terminate the telemedicine visit at any time. I understand that I have the right to inspect all information obtained and/or recorded in the course of the telemedicine visit and may receive copies of available information for a reasonable fee.  I understand that some of the potential risks of receiving the Services via telemedicine include:  Delay or interruption in medical evaluation due to technological equipment failure or disruption; Information transmitted may not be sufficient (e.g.  poor resolution of images) to allow for appropriate medical decision making by the Practitioner; and/or  In rare instances, security protocols could fail, causing a breach of personal health information.  Furthermore, I acknowledge that it is my responsibility to provide information about my medical history, conditions and care that is complete and accurate to the best of my ability. I acknowledge that Practitioner's advice, recommendations, and/or decision may be based on factors not within their control, such as incomplete or inaccurate data provided by me or distortions of diagnostic images or specimens that may result from electronic transmissions. I understand that the practice of medicine is not an exact science and that Practitioner makes no warranties or guarantees regarding treatment outcomes. I acknowledge that a copy of this consent can be made available to me via my patient portal [facility] MyChart), or I can request a printed copy by calling the office of [facility].    I understand that my insurance will be billed for this visit.   I have read or had this consent read to me. I understand the contents of this consent, which adequately explains the benefits and risks of the Services being provided via telemedicine.  I have been provided ample opportunity to ask questions regarding this consent and the Services and have had my questions answered to my satisfaction. I give my informed consent for the services to be provided through the use of telemedicine in my medical care

## 2022-06-10 NOTE — Telephone Encounter (Signed)
Patient returned Pre-op call. 

## 2022-06-10 NOTE — Telephone Encounter (Signed)
Pt has been scheduled for tele pre op appt 06/14/22 @ 9:20. Med rec and consent are done.

## 2022-06-13 DIAGNOSIS — E113293 Type 2 diabetes mellitus with mild nonproliferative diabetic retinopathy without macular edema, bilateral: Secondary | ICD-10-CM | POA: Diagnosis not present

## 2022-06-13 DIAGNOSIS — H2513 Age-related nuclear cataract, bilateral: Secondary | ICD-10-CM | POA: Diagnosis not present

## 2022-06-13 DIAGNOSIS — H40003 Preglaucoma, unspecified, bilateral: Secondary | ICD-10-CM | POA: Diagnosis not present

## 2022-06-14 ENCOUNTER — Ambulatory Visit: Payer: Medicare Other | Attending: Nurse Practitioner

## 2022-06-14 DIAGNOSIS — Z01810 Encounter for preprocedural cardiovascular examination: Secondary | ICD-10-CM | POA: Diagnosis not present

## 2022-06-14 NOTE — Telephone Encounter (Signed)
1.  Preoperative Cardiovascular Risk Assessment:   Ms. Dross's perioperative risk of a major cardiac event is 0.9% according to the Revised Cardiac Risk Index (RCRI).  Therefore, she is at low risk for perioperative complications.   Her functional capacity is good at 5.81 METs according to the Duke Activity Status Index (DASI). Recommendations: According to ACC/AHA guidelines, no further cardiovascular testing needed.  The patient may proceed to surgery at acceptable risk.       Sharlene Dory, PA-C

## 2022-06-14 NOTE — Progress Notes (Signed)
Virtual Visit via Telephone Note   Because of Sara Zhang's co-morbid illnesses, she is at least at moderate risk for complications without adequate follow up.  This format is felt to be most appropriate for this patient at this time.  The patient did not have access to video technology/had technical difficulties with video requiring transitioning to audio format only (telephone).  All issues noted in this document were discussed and addressed.  No physical exam could be performed with this format.  Please refer to the patient's chart for her consent to telehealth for [facility].  Evaluation Performed:  Preoperative cardiovascular risk assessment _____________   Date:  06/14/2022   Patient ID:  Sara Zhang, DOB 19-Jun-1970, MRN [id number] Patient Location:  Home Provider location:   Office  Primary Care Provider:  Marjie Skiff, NP Primary Cardiologist:  Yvonne Kendall, MD  Chief Complaint / Patient Profile   52 y.o. y/o female with a h/o heart failure (LVEF reportedly 25 to 35% in 2014 with subsequent normalization), hypertension, hyperlipidemia, OSA on CPAP, COVID-19 in 01/2019, and type 2 diabetes mellitus who is pending endoscopic retrograde ERCP and presents today for telephonic preoperative cardiovascular risk assessment.  History of Present Illness    Sara Zhang is a 52 y.o. female who presents via audio/video conferencing for a telehealth visit today.  Pt was last seen in cardiology clinic on 03/29/2022 by Dr. Okey Dupre.  At that time Sara Zhang was doing well .  The patient is now pending procedure as outlined above. Since her last visit, she feels like she has been doing pretty good. No issues with BP, chest pains, or SOB. She does meet 4 mets. She does try to walk and can do some indoor clean and such.  No medications indicated as needing held.  Past Medical History    Past Medical History:  Diagnosis Date   Abnormal uterine bleeding (AUB)  11/25/2016   Chronic diastolic CHF (congestive heart failure) (HCC)    a. 02/2012 Echo: EF 25-35%, glob HK; b. 06/2016 Echo: EF 55-60%, mild MR, nl PASP.   CKD (chronic kidney disease), stage II    COVID-19    Diabetes mellitus without complication (HCC)    Extreme obesity    Hyperlipidemia    Hypertension    Hypoxia    Microcytic anemia    Pneumonia    Pneumonia    Sleep apnea    Status post endometrial ablation 06/20/2016   2015 hysteroscopy/D&C with NovaSure endometrial ablation; pathology-benign endometrial polyps   Thickened endometrium 06/20/2016   Ultrasound-22.3 mm endometrial stripe, heterogenous   Vaginal bleeding    a. 01/2017 s/p hyteroscopy and polypectomy [facility]   Vitamin D deficiency    Past Surgical History:  Procedure Laterality Date   CESAREAN SECTION     CHOLECYSTECTOMY     DILATION AND CURETTAGE, DIAGNOSTIC / THERAPEUTIC     TONSILLECTOMY AND ADENOIDECTOMY     TOOTH EXTRACTION  01/2016    Allergies  No Known Allergies  Home Medications    Prior to Admission medications   Medication Sig Start Date End Date Taking? Authorizing Provider  albuterol (VENTOLIN HFA) 108 (90 Base) MCG/ACT inhaler INHALE 2 PUFFS INTO THE LUNGS EVERY 6 HOURS AS NEEDED FOR WHEEZING OR SHORTNESS OF BREATH 04/01/22   Cannady, Jolene T, NP  amLODipine (NORVASC) 5 MG tablet TAKE 1 TABLET(5 MG) BY MOUTH DAILY 05/20/22   End, Cristal Deer, MD  atenolol (TENORMIN) 50 MG tablet TAKE 1 TABLET(50  MG) BY MOUTH DAILY 05/20/22   Cannady, Corrie Dandy T, NP  atorvastatin (LIPITOR) 10 MG tablet TAKE 1 TABLET(10 MG) BY MOUTH DAILY 02/18/22   Cannady, Jolene T, NP  Blood Glucose Monitoring Suppl (CONTOUR NEXT MONITOR) w/Device KIT To check blood sugar 4 times daily. 12/22/19   Cannady, Corrie Dandy T, NP  Calcium Carbonate-Vitamin D (OYSTER SHELL CALCIUM 500 + D) 500-125 MG-UNIT TABS Take 1 tablet by mouth daily.     [provider]  Cholecalciferol (VITAMIN D3) 10 MCG (400 UNIT) tablet Take 400 Units by  mouth daily.    [provider]  Dulaglutide (TRULICITY) 3 MG/0.5ML SOPN Inject 3 mg as directed once a week. 01/21/22   Cannady, Corrie Dandy T, NP  furosemide (LASIX) 40 MG tablet TAKE 1 TABLET(40 MG) BY MOUTH TWICE DAILY 02/19/22   End, Cristal Deer, MD  glucose blood (CONTOUR NEXT TEST) test strip TEST BLOOD SUGAR ONCE DAILY 01/09/18   Cannady, Corrie Dandy T, NP  HYDROcodone-acetaminophen (NORCO/VICODIN) 5-325 MG tablet Take 1 tablet by mouth every 6 (six) hours as needed for severe pain. 05/22/22   Cannady, Corrie Dandy T, NP  Insulin Glargine (BASAGLAR KWIKPEN) 100 UNIT/ML Inject 52 Units into the skin at bedtime. 07/30/21   [provider]  Insulin Pen Needle 32G X 4 MM MISC 1 Units by Does not apply route every morning. Pen needles 08/21/16   Gabriel Cirri, NP  JARDIANCE 25 MG TABS tablet TAKE 1 TABLET(25 MG) BY MOUTH DAILY 05/22/22   Aura Dials T, NP  Lancets (ONETOUCH ULTRASOFT) lancets Use as instructed 06/05/20   Aura Dials T, NP  levonorgestrel (MIRENA) 20 MCG/24HR IUD 1 each by Intrauterine route once.     [provider]  losartan (COZAAR) 25 MG tablet TAKE 1 TABLET(25 MG) BY MOUTH DAILY 11/20/21   Cannady, Jolene T, NP  OXYGEN Inhale 2 L into the lungs daily.    [provider]    Physical Exam    Vital Signs:  Sara Zhang does not have vital signs available for review today.  Given telephonic nature of communication, physical exam is limited. AAOx3. NAD. Normal affect.  Speech and respirations are unlabored.  Accessory Clinical Findings    None  Assessment & Plan    1.  Preoperative Cardiovascular Risk Assessment:  Sara Zhang's perioperative risk of a major cardiac event is 0.9% according to the Revised Cardiac Risk Index (RCRI).  Therefore, she is at low risk for perioperative complications.   Her functional capacity is good at 5.81 METs according to the Duke Activity Status Index (DASI). Recommendations: According to ACC/AHA guidelines, no  further cardiovascular testing needed.  The patient may proceed to surgery at acceptable risk.    A copy of this note will be routed to requesting surgeon.  Time:   Today, I have spent 10 minutes with the patient with telehealth technology discussing medical history, symptoms, and management plan.     Sharlene Dory, PA-C  06/14/2022, 8:39 AM

## 2022-06-19 ENCOUNTER — Encounter: Payer: Self-pay | Admitting: Podiatry

## 2022-06-19 ENCOUNTER — Ambulatory Visit (INDEPENDENT_AMBULATORY_CARE_PROVIDER_SITE_OTHER): Payer: Medicare Other | Admitting: Podiatry

## 2022-06-19 DIAGNOSIS — M79676 Pain in unspecified toe(s): Secondary | ICD-10-CM

## 2022-06-19 DIAGNOSIS — B351 Tinea unguium: Secondary | ICD-10-CM | POA: Diagnosis not present

## 2022-06-19 NOTE — Progress Notes (Signed)
She presents today for chief complaint of painful elongated toenails.  States that she has diabetes is recently taken a round of prednisone and has increased her A1c to 7.2.  She denies fever chills nausea vomit muscle aches and pains any other problems.  States that she has had no problems with her feet.  Objective: Vital signs stable alert and x 3 pulses strong palpable.  Neurologic sensorium is intact deep tendon reflexes are intact muscle strength is normal and symmetrical.  Toenails are long thick yellow dystrophic clinically mycotic with onychocryptosis.  Assessment: Pain limb secondary to onychomycosis.  Plan: Debridement toenails 1 through 5 bilateral covered service secondary to pain.

## 2022-06-20 ENCOUNTER — Encounter: Payer: Self-pay | Admitting: Gastroenterology

## 2022-06-26 ENCOUNTER — Encounter: Payer: Self-pay | Admitting: Gastroenterology

## 2022-06-27 ENCOUNTER — Encounter
Admission: RE | Disposition: A | Discharge status: Home / Self Care | Payer: Self-pay | Source: Home / Self Care | Attending: Gastroenterology

## 2022-06-27 ENCOUNTER — Encounter: Payer: Self-pay | Admitting: Gastroenterology

## 2022-06-27 ENCOUNTER — Ambulatory Visit
Admission: RE | Admit: 2022-06-27 | Discharge: 2022-06-27 | Disposition: A | Discharge status: Home / Self Care | Payer: Medicare Other | Attending: Gastroenterology | Admitting: Gastroenterology

## 2022-06-27 ENCOUNTER — Ambulatory Visit: Payer: Medicare Other

## 2022-06-27 ENCOUNTER — Other Ambulatory Visit: Payer: Self-pay

## 2022-06-27 ENCOUNTER — Ambulatory Visit: Payer: Medicare Other | Admitting: Certified Registered Nurse Anesthetist

## 2022-06-27 DIAGNOSIS — Z9049 Acquired absence of other specified parts of digestive tract: Secondary | ICD-10-CM | POA: Diagnosis not present

## 2022-06-27 DIAGNOSIS — Z975 Presence of (intrauterine) contraceptive device: Secondary | ICD-10-CM | POA: Diagnosis not present

## 2022-06-27 DIAGNOSIS — G4730 Sleep apnea, unspecified: Secondary | ICD-10-CM | POA: Diagnosis not present

## 2022-06-27 DIAGNOSIS — K831 Obstruction of bile duct: Secondary | ICD-10-CM | POA: Diagnosis not present

## 2022-06-27 DIAGNOSIS — N183 Chronic kidney disease, stage 3 unspecified: Secondary | ICD-10-CM | POA: Diagnosis not present

## 2022-06-27 DIAGNOSIS — Z79899 Other long term (current) drug therapy: Secondary | ICD-10-CM | POA: Insufficient documentation

## 2022-06-27 DIAGNOSIS — E785 Hyperlipidemia, unspecified: Secondary | ICD-10-CM | POA: Diagnosis not present

## 2022-06-27 DIAGNOSIS — Z794 Long term (current) use of insulin: Secondary | ICD-10-CM | POA: Insufficient documentation

## 2022-06-27 DIAGNOSIS — I5032 Chronic diastolic (congestive) heart failure: Secondary | ICD-10-CM | POA: Diagnosis not present

## 2022-06-27 DIAGNOSIS — E1169 Type 2 diabetes mellitus with other specified complication: Secondary | ICD-10-CM | POA: Diagnosis not present

## 2022-06-27 DIAGNOSIS — Z6841 Body Mass Index (BMI) 40.0 and over, adult: Secondary | ICD-10-CM | POA: Insufficient documentation

## 2022-06-27 DIAGNOSIS — Z7985 Long-term (current) use of injectable non-insulin antidiabetic drugs: Secondary | ICD-10-CM | POA: Insufficient documentation

## 2022-06-27 DIAGNOSIS — N182 Chronic kidney disease, stage 2 (mild): Secondary | ICD-10-CM | POA: Diagnosis not present

## 2022-06-27 DIAGNOSIS — I130 Hypertensive heart and chronic kidney disease with heart failure and stage 1 through stage 4 chronic kidney disease, or unspecified chronic kidney disease: Secondary | ICD-10-CM | POA: Insufficient documentation

## 2022-06-27 DIAGNOSIS — K838 Other specified diseases of biliary tract: Secondary | ICD-10-CM | POA: Diagnosis not present

## 2022-06-27 DIAGNOSIS — E1129 Type 2 diabetes mellitus with other diabetic kidney complication: Secondary | ICD-10-CM | POA: Diagnosis not present

## 2022-06-27 DIAGNOSIS — Z7984 Long term (current) use of oral hypoglycemic drugs: Secondary | ICD-10-CM | POA: Diagnosis not present

## 2022-06-27 DIAGNOSIS — E1122 Type 2 diabetes mellitus with diabetic chronic kidney disease: Secondary | ICD-10-CM | POA: Diagnosis not present

## 2022-06-27 HISTORY — PX: ERCP: SHX5425

## 2022-06-27 LAB — GLUCOSE, CAPILLARY: Glucose-Capillary: 123 mg/dL — ABNORMAL HIGH (ref 70–99)

## 2022-06-27 LAB — POCT PREGNANCY, URINE: Preg Test, Ur: NEGATIVE

## 2022-06-27 SURGERY — ERCP, WITH INTERVENTION IF INDICATED
Anesthesia: General

## 2022-06-27 MED ORDER — ONDANSETRON HCL 4 MG/2ML IJ SOLN
4.0000 mg | Freq: Once | INTRAMUSCULAR | Status: AC
  Administered 2022-06-27: 4 mg via INTRAVENOUS

## 2022-06-27 MED ORDER — PROPOFOL 10 MG/ML IV BOLUS
INTRAVENOUS | Status: DC | PRN
  Administered 2022-06-27: 30 mg via INTRAVENOUS
  Administered 2022-06-27: 20 mg via INTRAVENOUS

## 2022-06-27 MED ORDER — DROPERIDOL 2.5 MG/ML IJ SOLN
0.6240 mg | Freq: Once | INTRAMUSCULAR | Status: AC
  Administered 2022-06-27: 0.624 mg via INTRAVENOUS

## 2022-06-27 MED ORDER — ONDANSETRON HCL 4 MG/2ML IJ SOLN
INTRAMUSCULAR | Status: DC | PRN
  Administered 2022-06-27: 4 mg via INTRAVENOUS

## 2022-06-27 MED ORDER — LACTATED RINGERS IV SOLN: INTRAVENOUS | Status: DC

## 2022-06-27 MED ORDER — ONDANSETRON HCL 4 MG/2ML IJ SOLN
INTRAMUSCULAR | Status: AC
  Filled 2022-06-27: qty 2

## 2022-06-27 MED ORDER — PROPOFOL 500 MG/50ML IV EMUL
INTRAVENOUS | Status: DC | PRN
  Administered 2022-06-27: 100 ug/kg/min via INTRAVENOUS

## 2022-06-27 MED ORDER — ONDANSETRON HCL 4 MG PO TABS: 4.0000 mg | ORAL_TABLET | Freq: Three times a day (TID) | ORAL | 0 refills | Status: DC | PRN

## 2022-06-27 MED ORDER — GLYCOPYRROLATE 0.2 MG/ML IJ SOLN
INTRAMUSCULAR | Status: DC | PRN
  Administered 2022-06-27: .2 mg via INTRAVENOUS

## 2022-06-27 MED ORDER — SODIUM CHLORIDE 0.9 % IV SOLN: INTRAVENOUS | Status: DC

## 2022-06-27 MED ORDER — DICLOFENAC SUPPOSITORY 100 MG
RECTAL | Status: AC
  Filled 2022-06-27: qty 1

## 2022-06-27 MED ORDER — DICLOFENAC SUPPOSITORY 100 MG
100.0000 mg | Freq: Once | RECTAL | Status: AC
  Administered 2022-06-27: 100 mg via RECTAL

## 2022-06-27 MED ORDER — DROPERIDOL 2.5 MG/ML IJ SOLN
INTRAMUSCULAR | Status: AC
  Filled 2022-06-27: qty 2

## 2022-06-27 NOTE — Anesthesia Postprocedure Evaluation (Signed)
Anesthesia Post Note  Patient: Sara Zhang  Procedure(s) Performed: ENDOSCOPIC RETROGRADE CHOLANGIOPANCREATOGRAPHY (ERCP)  Patient location during evaluation: PACU Anesthesia Type: General Level of consciousness: sedated Pain management: satisfactory to patient Vital Signs Assessment: post-procedure vital signs reviewed and stable Respiratory status: spontaneous breathing and patient connected to nasal cannula oxygen Cardiovascular status: stable Anesthetic complications: no   No notable events documented.   Last Vitals:  Vitals:   06/27/22 1104 06/27/22 1111  BP:    Pulse:    Resp:    Temp:    SpO2: 94% (!) 83%    Last Pain:  Vitals:   06/27/22 1044  TempSrc: Temporal  PainSc: Asleep                 VAN STAVEREN,Duayne Brideau

## 2022-06-27 NOTE — OR Nursing (Signed)
VOMITED 75 CC BILE COLORED FLUID. REPORTS FEELING SOMEWHAT BETTER. ABDOMEN IS SOFT. CONTIN. UES TO DENY ANY PAIN. HAS DRUNK 150CC SODA SO FAR.

## 2022-06-27 NOTE — Anesthesia Preprocedure Evaluation (Signed)
Anesthesia Evaluation  Patient identified by MRN, date of birth, ID band Patient awake    Reviewed: Allergy & Precautions, NPO status , Patient's Chart, lab work & pertinent test results  Airway Mallampati: III  TM Distance: >3 FB Neck ROM: Full    Dental  (+) Teeth Intact   Pulmonary neg pulmonary ROS, asthma , sleep apnea    Pulmonary exam normal  + decreased breath sounds      Cardiovascular Exercise Tolerance: Good hypertension, negative cardio ROS Normal cardiovascular exam Rhythm:Regular Rate:Normal     Neuro/Psych negative neurological ROS  negative psych ROS   GI/Hepatic negative GI ROS, Neg liver ROS,,,  Endo/Other  negative endocrine ROSdiabetes, Type 2  Morbid obesity  Renal/GU negative Renal ROS  negative genitourinary   Musculoskeletal negative musculoskeletal ROS (+)    Abdominal  (+) + obese  Peds negative pediatric ROS (+)  Hematology negative hematology ROS (+) Blood dyscrasia, anemia   Anesthesia Other Findings Past Medical History: 11/25/2016: Abnormal uterine bleeding (AUB) No date: Chronic diastolic CHF (congestive heart failure) (HCC)     Comment:  a. 02/2012 Echo: EF 25-35%, glob HK; b. 06/2016 Echo: EF               55-60%, mild MR, nl PASP. No date: CKD (chronic kidney disease), stage II No date: COVID-19 No date: Diabetes mellitus without complication (HCC) No date: Extreme obesity No date: Hyperlipidemia No date: Hypertension No date: Hypoxia No date: Microcytic anemia No date: Pneumonia No date: Pneumonia No date: Sleep apnea 06/20/2016: Status post endometrial ablation     Comment:  2015 hysteroscopy/D&C with NovaSure endometrial               ablation; pathology-benign endometrial polyps 06/20/2016: Thickened endometrium     Comment:  Ultrasound-22.3 mm endometrial stripe, heterogenous No date: Vaginal bleeding     Comment:  a. 01/2017 s/p hyteroscopy and polypectomy  [facility] No date: Vitamin D deficiency  Past Surgical History: No date: CESAREAN SECTION No date: CHOLECYSTECTOMY No date: DILATION AND CURETTAGE, DIAGNOSTIC / THERAPEUTIC No date: TONSILLECTOMY AND ADENOIDECTOMY 01/2016: TOOTH EXTRACTION  BMI    Body Mass Index: 52.28 kg/m      Reproductive/Obstetrics negative OB ROS                             Anesthesia Physical Anesthesia Plan  ASA: 3  Anesthesia Plan: General   Post-op Pain Management:    Induction: Intravenous  PONV Risk Score and Plan: Propofol infusion and TIVA  Airway Management Planned: Natural Airway and Nasal Cannula  Additional Equipment:   Intra-op Plan:   Post-operative Plan:   Informed Consent: I have reviewed the patients History and Physical, chart, labs and discussed the procedure including the risks, benefits and alternatives for the proposed anesthesia with the patient or authorized representative who has indicated his/her understanding and acceptance.     Dental Advisory Given  Plan Discussed with: CRNA and Surgeon  Anesthesia Plan Comments:        Anesthesia Quick Evaluation

## 2022-06-27 NOTE — Transfer of Care (Signed)
Immediate Anesthesia Transfer of Care Note  Patient: Sara Zhang  Procedure(s) Performed: ENDOSCOPIC RETROGRADE CHOLANGIOPANCREATOGRAPHY (ERCP)  Patient Location: PACU  Anesthesia Type:General  Level of Consciousness: drowsy  Airway & Oxygen Therapy: Patient Spontanous Breathing  Post-op Assessment: Report given to RN and Post -op Vital signs reviewed and stable  Post vital signs: Reviewed and stable  Last Vitals:  Vitals Value Taken Time  BP 119/82 06/27/22 1045  Temp 35.6 C 06/27/22 1044  Pulse 74 06/27/22 1045  Resp 15 06/27/22 1044  SpO2 97 % 06/27/22 1045  Vitals shown include unvalidated device data.  Last Pain:  Vitals:   06/27/22 1044  TempSrc: Temporal  PainSc: Asleep         Complications: No notable events documented.

## 2022-06-27 NOTE — OR Nursing (Signed)
Experiencing nausea. Rn suctioned  bile fluid. Medicated with zofran 4mg  iv by crna Florentina Addison. Pt alert but groggy. Will continue to monitor

## 2022-06-27 NOTE — OR Nursing (Signed)
Remains very restless . Reports "I FEEL AWFUL'''. Remains nauseated . DR   Fernand Parkins ON PT CONDITION

## 2022-06-27 NOTE — Op Note (Signed)
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 Cannady                         (Referring MD) Medicines:             Propofol per Anesthesia Complications:         No immediate complications. Procedure:             Pre-Anesthesia Assessment:                        - Prior to the procedure, a History and Physical was                         performed, and patient medications and allergies were                         reviewed. The patient's tolerance of previous                         anesthesia was also reviewed. The risks and benefits                         of the procedure and the sedation options and risks                         were discussed with the patient. All questions were                         answered, and informed consent was obtained. Prior                         Anticoagulants: The patient has taken no anticoagulant                         or antiplatelet agents. ASA Grade Assessment: II - A                         patient with mild systemic disease. After reviewing                         the risks and benefits, the patient was deemed in                         satisfactory condition to undergo the procedure.                        After obtaining informed consent, the scope was passed  under direct vision. Throughout the procedure, the                         patient's blood pressure, pulse, and oxygen                         saturations were monitored continuously. The                          Duodenoscope was introduced through the mouth, and                         used to inject contrast into and used to inject                         contrast into the bile duct. Findings:      A scout film of the abdomen was obtained. Surgical clips, consistent       with a previous cholecystectomy, were seen in the area of the right       upper quadrant of the abdomen. The esophagus was successfully intubated       under direct vision. The scope was advanced to a normal major papilla in       the descending duodenum without detailed examination of the pharynx,       larynx and associated structures, and upper GI tract. The upper GI tract       was grossly normal. The bile duct was deeply cannulated with the       short-nosed traction sphincterotome. Contrast was injected. I personally       interpreted the bile duct images. There was brisk flow of contrast       through the ducts. Image quality was excellent. Contrast extended to the       entire biliary tree. The main bile duct was diffusely dilated. A       straight Roadrunner wire was passed into the biliary tree. A 5 mm       biliary sphincterotomy was made with a traction (standard)       sphincterotome using ERBE electrocautery. There was no       post-sphincterotomy bleeding. The biliary tree was swept with a 15 mm       balloon starting at the bifurcation. Nothing was found. The wire was       passed into the left hepatic duct and it was brushed for cytology.      The wire was then passed into the right hepatic tuct and appeared to be       narrowed. It was brushed. Cells for cytology were obtained by brushing       in the left main hepatic duct and the right main hepatic duct. Dilation       of the right main hepatic duct with Four mm dilator was successful. Impression:            - The entire main bile duct was dilated.                        - A biliary sphincterotomy was performed.                        - The biliary tree  was swept and nothing was found.                        -  Cells for cytology obtained in the left main hepatic                         duct and in the right main hepatic duct.                        - The right main hepatic duct was successfully dilated. Recommendation:        - Discharge patient to home.                        - Clear liquid diet today.                        - Watch for pancreatitis, bleeding, perforation, and                         cholangitis.                        - Return to my office in 3 weeks. Procedure Code(s):     --- Professional ---                        [phone], Endoscopic retrograde cholangiopancreatography                         (ERCP); with trans-endoscopic balloon dilation of                         biliary/pancreatic duct(s) or of ampulla                         (sphincteroplasty), including sphincterotomy, when                         performed, each duct                        [id number], 59, Endoscopic retrograde                         cholangiopancreatography (ERCP); with                         sphincterotomy/papillotomy                        [phone], Endoscopic catheterization of the biliary                         ductal system, radiological supervision and                         interpretation Diagnosis Code(s):     --- Professional ---                        K83.8, Other specified diseases of biliary tract                        K83.1, Obstruction of bile duct CPT copyright 2022 American Medical Association. All rights reserved. The codes documented in this report are preliminary and upon coder review  may  be revised to meet current compliance requirements. Midge Minium MD, MD 06/27/2022 10:46:47 AM This report has been signed electronically. Number of Addenda: 0 Note Initiated On: 06/27/2022 9:29 AM Estimated Blood Loss:  Estimated blood loss: none.      [facility]

## 2022-06-27 NOTE — H&P (Signed)
Midge Minium, MD [facility] [address] [place], [place] [address] [phone] Fax : [phone]  Primary Care Physician:  Marjie Skiff, NP Primary Gastroenterologist:  Dr. Servando Snare  Pre-Procedure History & Physical: HPI:  Sara Zhang is a 52 y.o. female is here for an ERCP.   Past Medical History:  Diagnosis Date   Abnormal uterine bleeding (AUB) 11/25/2016   Chronic diastolic CHF (congestive heart failure) (HCC)    a. 02/2012 Echo: EF 25-35%, glob HK; b. 06/2016 Echo: EF 55-60%, mild MR, nl PASP.   CKD (chronic kidney disease), stage II    COVID-19    Diabetes mellitus without complication (HCC)    Extreme obesity    Hyperlipidemia    Hypertension    Hypoxia    Microcytic anemia    Pneumonia    Pneumonia    Sleep apnea    Status post endometrial ablation 06/20/2016   2015 hysteroscopy/D&C with NovaSure endometrial ablation; pathology-benign endometrial polyps   Thickened endometrium 06/20/2016   Ultrasound-22.3 mm endometrial stripe, heterogenous   Vaginal bleeding    a. 01/2017 s/p hyteroscopy and polypectomy [facility]).   Vitamin D deficiency     Past Surgical History:  Procedure Laterality Date   CESAREAN SECTION     CHOLECYSTECTOMY     DILATION AND CURETTAGE, DIAGNOSTIC / THERAPEUTIC     TONSILLECTOMY AND ADENOIDECTOMY     TOOTH EXTRACTION  01/2016    Prior to Admission medications   Medication Sig Start Date End Date Taking? Authorizing Provider  amLODipine (NORVASC) 5 MG tablet TAKE 1 TABLET(5 MG) BY MOUTH DAILY 05/20/22  Yes End, Cristal Deer, MD  atenolol (TENORMIN) 50 MG tablet TAKE 1 TABLET(50 MG) BY MOUTH DAILY 05/20/22  Yes Cannady, Jolene T, NP  atorvastatin (LIPITOR) 10 MG tablet TAKE 1 TABLET(10 MG) BY MOUTH DAILY 02/18/22  Yes Cannady, Jolene T, NP  JARDIANCE 25 MG TABS tablet TAKE 1 TABLET(25 MG) BY MOUTH DAILY 05/22/22  Yes Cannady, Jolene T, NP  losartan (COZAAR) 25 MG tablet TAKE 1 TABLET(25 MG) BY MOUTH DAILY 11/20/21  Yes Cannady,  Jolene T, NP  albuterol (VENTOLIN HFA) 108 (90 Base) MCG/ACT inhaler INHALE 2 PUFFS INTO THE LUNGS EVERY 6 HOURS AS NEEDED FOR WHEEZING OR SHORTNESS OF BREATH 04/01/22   Cannady, Jolene T, NP  Blood Glucose Monitoring Suppl (CONTOUR NEXT MONITOR) w/Device KIT To check blood sugar 4 times daily. 12/22/19   Cannady, Corrie Dandy T, NP  Calcium Carbonate-Vitamin D (OYSTER SHELL CALCIUM 500 + D) 500-125 MG-UNIT TABS Take 1 tablet by mouth daily.     [provider]  Cholecalciferol (VITAMIN D3) 10 MCG (400 UNIT) tablet Take 400 Units by mouth daily.    [provider]  Dulaglutide (TRULICITY) 3 MG/0.5ML SOPN Inject 3 mg as directed once a week. 01/21/22   Cannady, Corrie Dandy T, NP  furosemide (LASIX) 40 MG tablet TAKE 1 TABLET(40 MG) BY MOUTH TWICE DAILY 02/19/22   End, Cristal Deer, MD  glucose blood (CONTOUR NEXT TEST) test strip TEST BLOOD SUGAR ONCE DAILY 01/09/18   Cannady, Corrie Dandy T, NP  HYDROcodone-acetaminophen (NORCO/VICODIN) 5-325 MG tablet Take 1 tablet by mouth every 6 (six) hours as needed for severe pain. 05/22/22   Cannady, Corrie Dandy T, NP  Insulin Glargine (BASAGLAR KWIKPEN) 100 UNIT/ML Inject 52 Units into the skin at bedtime. 07/30/21   [provider]  Insulin Pen Needle 32G X 4 MM MISC 1 Units by Does not apply route every morning. Pen needles 08/21/16   Gabriel Cirri,  NP  Lancets (ONETOUCH ULTRASOFT) lancets Use as instructed 06/05/20   Aura Dials T, NP  levonorgestrel (MIRENA) 20 MCG/24HR IUD 1 each by Intrauterine route once.     [provider]  OXYGEN Inhale 2 L into the lungs daily.    [provider]    Allergies as of 06/04/2022   (No Known Allergies)    Family History  Problem Relation Age of Onset   Diabetes Mother    Hyperlipidemia Mother    Hypertension Mother    Stroke Mother    Arthritis Father    Asthma Father    Diabetes Father    Hypertension Father    Hypertension Brother    Diabetes Brother    Diabetes Maternal Grandmother     Hypertension Maternal Grandmother    Diabetes Maternal Grandfather    Hypertension Maternal Grandfather    Diabetes Paternal Grandmother    Hypertension Paternal Grandmother    Heart attack Maternal Uncle    Breast cancer Neg Hx     Social History   Socioeconomic History   Marital status: Single    Spouse name: Not on file   Number of children: Not on file   Years of education: GED   Highest education level: GED or equivalent  Occupational History   Occupation: disability  Tobacco Use   Smoking status: Never   Smokeless tobacco: Never  Vaping Use   Vaping Use: Never used  Substance and Sexual Activity   Alcohol use: Yes    Alcohol/week: 0.0 standard drinks of alcohol    Comment: 1-2 beers/month   Drug use: No   Sexual activity: Yes    Birth control/protection: None  Other Topics Concern   Not on file  Social History Narrative   The patient's son lives with her.  The patient recently lost an aunt in December 2020 and feels she got C19 related to the amount of family and friends coming in and out to pay respects to her aunt.    Social Determinants of Health   Financial Resource Strain: Medium Risk (05/21/2022)   Overall Financial Resource Strain (CARDIA)    Difficulty of Paying Living Expenses: Somewhat hard  Food Insecurity: No Food Insecurity (05/21/2022)   Hunger Vital Sign    Worried About Running Out of Food in the Last Year: Never true    Ran Out of Food in the Last Year: Never true  Transportation Needs: No Transportation Needs (05/21/2022)   PRAPARE - Administrator, Civil Service (Medical): No    Lack of Transportation (Non-Medical): No  Physical Activity: Insufficiently Active (05/21/2022)   Exercise Vital Sign    Days of Exercise per Week: 3 days    Minutes of Exercise per Session: 20 min  Stress: No Stress Concern Present (05/21/2022)   [employer] of Occupational Health - Occupational Stress Questionnaire    Feeling of Stress : Not at  all  Social Connections: Moderately Isolated (05/21/2022)   Social Connection and Isolation Panel [NHANES]    Frequency of Communication with Friends and Family: More than three times a week    Frequency of Social Gatherings with Friends and Family: Once a week    Attends Religious Services: 1 to 4 times per year    Active Member of [employer] or Organizations: No    Attends Banker Meetings: Never    Marital Status: Never married  Intimate Partner Violence: Not At Risk (04/30/2022)   Humiliation, Afraid, Rape, and Kick questionnaire  Fear of Current or Ex-Partner: No    Emotionally Abused: No    Physically Abused: No    Sexually Abused: No    Review of Systems: See HPI, otherwise negative ROS  Physical Exam: BP 107/76   Pulse 72   Temp (!) 96.8 F (36 C) (Temporal)   Resp 20   Ht 5\' 9"  (1.753 m)   Wt (!) 160.6 kg   LMP 05/31/2022 (Approximate)   SpO2 98%   BMI 52.28 kg/m  General:   Alert,  pleasant and cooperative in NAD Head:  Normocephalic and atraumatic. Neck:  Supple; no masses or thyromegaly. Lungs:  Clear throughout to auscultation.    Heart:  Regular rate and rhythm. Abdomen:  Soft, nontender and nondistended. Normal bowel sounds, without guarding, and without rebound.   Neurologic:  Alert and  oriented x4;  grossly normal neurologically.  Impression/Plan: Sara Zhang is here for an ERCP to be performed for bile duct stricture  Risks, benefits, limitations, and alternatives regarding  ERCP have been reviewed with the patient.  Questions have been answered.  All parties agreeable.   Midge Minium, MD  06/27/2022, 9:09 AM

## 2022-06-27 NOTE — Anesthesia Procedure Notes (Addendum)
Date/Time: 06/27/2022 9:23 AM  Performed by: Malva Cogan, CRNAPre-anesthesia Checklist: Patient identified, Emergency Drugs available, Suction available, Patient being monitored and Timeout performed Patient Re-evaluated:Patient Re-evaluated prior to induction Oxygen Delivery Method: Supernova nasal CPAP Induction Type: IV induction Placement Confirmation: CO2 detector and positive ETCO2

## 2022-06-27 NOTE — OR Nursing (Signed)
PT CONTINES TO VERBALIZE PERSISTENT NAUSEA. NO RESPONSE TO PREVIOUS ZOFRAN. PER DR VANSTAVEREN . MEDICATE WITH ZOFRAN 4MG  IV AGAIN. WILL CONTINUE TO MONITOR. NOT PASSING ANY FLATUS. ASSISTED ONTO BSC .REPORTS "I WILL FEEL BETTER IF I COULD JUST THROW UP"

## 2022-06-28 ENCOUNTER — Other Ambulatory Visit: Payer: Self-pay

## 2022-06-28 ENCOUNTER — Encounter: Payer: Self-pay | Admitting: Gastroenterology

## 2022-06-28 MED ORDER — TRULICITY 3 MG/0.5ML ~~LOC~~ SOAJ: 3.0000 mg | SUBCUTANEOUS | 4 refills | Status: DC

## 2022-06-28 NOTE — Telephone Encounter (Signed)
Medication refill for Trulicity 3mg /0.104ml last ov 05/22/22, upcoming ov 08/21/22 . Please advise

## 2022-06-29 ENCOUNTER — Emergency Department
Admission: EM | Admit: 2022-06-29 | Discharge: 2022-06-29 | Disposition: A | Discharge status: Home / Self Care | Payer: Medicare Other | Attending: Student in an Organized Health Care Education/Training Program | Admitting: Student in an Organized Health Care Education/Training Program

## 2022-06-29 ENCOUNTER — Emergency Department: Payer: Medicare Other

## 2022-06-29 DIAGNOSIS — R1110 Vomiting, unspecified: Secondary | ICD-10-CM | POA: Diagnosis not present

## 2022-06-29 DIAGNOSIS — I509 Heart failure, unspecified: Secondary | ICD-10-CM | POA: Insufficient documentation

## 2022-06-29 DIAGNOSIS — Z1152 Encounter for screening for COVID-19: Secondary | ICD-10-CM | POA: Diagnosis not present

## 2022-06-29 DIAGNOSIS — R0602 Shortness of breath: Secondary | ICD-10-CM | POA: Diagnosis not present

## 2022-06-29 DIAGNOSIS — R112 Nausea with vomiting, unspecified: Secondary | ICD-10-CM | POA: Insufficient documentation

## 2022-06-29 DIAGNOSIS — M7989 Other specified soft tissue disorders: Secondary | ICD-10-CM | POA: Diagnosis not present

## 2022-06-29 DIAGNOSIS — N281 Cyst of kidney, acquired: Secondary | ICD-10-CM | POA: Diagnosis not present

## 2022-06-29 LAB — CBC WITH DIFFERENTIAL/PLATELET
Abs Immature Granulocytes: 0.08 10*3/uL — ABNORMAL HIGH (ref 0.00–0.07)
Basophils Absolute: 0 10*3/uL (ref 0.0–0.1)
Basophils Relative: 0 %
Eosinophils Absolute: 0 10*3/uL (ref 0.0–0.5)
Eosinophils Relative: 0 %
HCT: 41.1 % (ref 36.0–46.0)
Hemoglobin: 12.3 g/dL (ref 12.0–15.0)
Immature Granulocytes: 1 %
Lymphocytes Relative: 14 %
Lymphs Abs: 1.5 10*3/uL (ref 0.7–4.0)
MCH: 26.1 pg (ref 26.0–34.0)
MCHC: 29.9 g/dL — ABNORMAL LOW (ref 30.0–36.0)
MCV: 87.1 fL (ref 80.0–100.0)
Monocytes Absolute: 0.7 10*3/uL (ref 0.1–1.0)
Monocytes Relative: 6 %
Neutro Abs: 8.6 10*3/uL — ABNORMAL HIGH (ref 1.7–7.7)
Neutrophils Relative %: 79 %
Platelets: 342 10*3/uL (ref 150–400)
RBC: 4.72 MIL/uL (ref 3.87–5.11)
RDW: 14.6 % (ref 11.5–15.5)
WBC: 10.9 10*3/uL — ABNORMAL HIGH (ref 4.0–10.5)
nRBC: 0 % (ref 0.0–0.2)

## 2022-06-29 LAB — D-DIMER, QUANTITATIVE: D-Dimer, Quant: 1.05 ug/mL-FEU — ABNORMAL HIGH (ref 0.00–0.50)

## 2022-06-29 LAB — BRAIN NATRIURETIC PEPTIDE: B Natriuretic Peptide: 170.5 pg/mL — ABNORMAL HIGH (ref 0.0–100.0)

## 2022-06-29 LAB — COMPREHENSIVE METABOLIC PANEL
ALT: 17 U/L (ref 0–44)
AST: 21 U/L (ref 15–41)
Albumin: 3.5 g/dL (ref 3.5–5.0)
Alkaline Phosphatase: 84 U/L (ref 38–126)
Anion gap: 8 (ref 5–15)
BUN: 17 mg/dL (ref 6–20)
CO2: 29 mmol/L (ref 22–32)
Calcium: 8.7 mg/dL — ABNORMAL LOW (ref 8.9–10.3)
Chloride: 100 mmol/L (ref 98–111)
Creatinine, Ser: 0.96 mg/dL (ref 0.44–1.00)
GFR, Estimated: >60 mL/min (ref >60)
Glucose, Bld: 267 mg/dL — ABNORMAL HIGH (ref 70–99)
Potassium: 4.7 mmol/L (ref 3.5–5.1)
Sodium: 137 mmol/L (ref 135–145)
Total Bilirubin: 1 mg/dL (ref 0.3–1.2)
Total Protein: 8.2 g/dL — ABNORMAL HIGH (ref 6.5–8.1)

## 2022-06-29 LAB — TROPONIN I (HIGH SENSITIVITY)
Troponin I (High Sensitivity): 12 ng/L (ref <18)
Troponin I (High Sensitivity): 11 ng/L (ref <18)

## 2022-06-29 LAB — LIPASE, BLOOD: Lipase: 30 U/L (ref 11–51)

## 2022-06-29 LAB — SARS CORONAVIRUS 2 BY RT PCR: SARS Coronavirus 2 by RT PCR: NEGATIVE

## 2022-06-29 MED ORDER — METOCLOPRAMIDE HCL 10 MG PO TABS: 10.0000 mg | ORAL_TABLET | Freq: Three times a day (TID) | ORAL | 1 refills | Status: DC

## 2022-06-29 MED ORDER — ALUM & MAG HYDROXIDE-SIMETH 200-200-20 MG/5ML PO SUSP
30.0000 mL | Freq: Once | ORAL | Status: AC
  Administered 2022-06-29: 30 mL via ORAL
  Filled 2022-06-29: qty 30

## 2022-06-29 MED ORDER — ONDANSETRON HCL 4 MG/2ML IJ SOLN
4.0000 mg | Freq: Once | INTRAMUSCULAR | Status: AC
  Administered 2022-06-29: 4 mg via INTRAVENOUS
  Filled 2022-06-29: qty 2

## 2022-06-29 MED ORDER — METOCLOPRAMIDE HCL 5 MG/ML IJ SOLN
10.0000 mg | Freq: Once | INTRAMUSCULAR | Status: AC
  Administered 2022-06-29: 10 mg via INTRAVENOUS
  Filled 2022-06-29: qty 2

## 2022-06-29 MED ORDER — IPRATROPIUM-ALBUTEROL 0.5-2.5 (3) MG/3ML IN SOLN
3.0000 mL | Freq: Once | RESPIRATORY_TRACT | Status: AC
  Administered 2022-06-29: 3 mL via RESPIRATORY_TRACT
  Filled 2022-06-29: qty 3

## 2022-06-29 MED ORDER — IOHEXOL 350 MG/ML SOLN
100.0000 mL | Freq: Once | INTRAVENOUS | Status: AC | PRN
  Administered 2022-06-29: 75 mL via INTRAVENOUS

## 2022-06-29 NOTE — ED Notes (Signed)
Pt to ED after ERCP on Thursday (3d ago) and has been feeling bad ever since, with nausea/vomiting and SOB. Denies pain. Pt appears slightly diaphoretic and is continually nauseous. EDP saw pt at bedside.

## 2022-06-29 NOTE — ED Provider Notes (Signed)
[facility] Provider Note    Event Date/Time   First MD Initiated Contact with Patient 06/29/22 [phone]     (approximate)   History   Emesis   HPI  Sara Zhang is a 52 y.o. female who presents to the ER for evaluation of worsening shortness of breath nausea vomiting.  She is status post recent ERCP with sphincterotomy status post cholecystectomy.  States that she was feeling unwell shortly after the procedure and has had progressively worsening shortness of breath.  Also has a history of CHF.  Does not recall if she was given any additional fluids during procedure.  Does endorse orthopnea.  Has had some chills but no measured temperature.     Physical Exam   Triage Vital Signs: ED Triage Vitals  Enc Vitals Group     BP 06/29/22 0951 138/71     Pulse Rate 06/29/22 0951 61     Resp 06/29/22 0951 20     Temp 06/29/22 0951 98.2 F (36.8 C)     Temp Source 06/29/22 0951 Oral     SpO2 06/29/22 0951 95 %     Weight 06/29/22 0952 (!) 352 lb 11.8 oz (160 kg)     Height --      Head Circumference --      Peak Flow --      Pain Score 06/29/22 0952 0     Pain Loc --      Pain Edu? --      Excl. in GC? --     Most recent vital signs: Vitals:   06/29/22 1330 06/29/22 1445  BP: (!) 145/78 128/76  Pulse: (!) 58 67  Resp: 18 18  Temp:    SpO2: 100% 96%     Constitutional: Alert  Eyes: Conjunctivae are normal.  Head: Atraumatic. Nose: No congestion/rhinnorhea. Mouth/Throat: Mucous membranes are moist.   Neck: Painless ROM.  Cardiovascular:   Good peripheral circulation. Respiratory: Normal respiratory effort.  No retractions.  Gastrointestinal: Soft and nontender.  Musculoskeletal:  no deformity Neurologic:  MAE spontaneously. No gross focal neurologic deficits are appreciated.  Skin:  Skin is warm, dry and intact. No rash noted. Psychiatric: Mood and affect are normal. Speech and behavior are normal.    ED Results / Procedures / Treatments    Labs (all labs ordered are listed, but only abnormal results are displayed) Labs Reviewed  CBC WITH DIFFERENTIAL/PLATELET - Abnormal; Notable for the following components:      Result Value   WBC 10.9 (*)    MCHC 29.9 (*)    Neutro Abs 8.6 (*)    Abs Immature Granulocytes 0.08 (*)    All other components within normal limits  COMPREHENSIVE METABOLIC PANEL - Abnormal; Notable for the following components:   Glucose, Bld 267 (*)    Calcium 8.7 (*)    Total Protein 8.2 (*)    All other components within normal limits  BRAIN NATRIURETIC PEPTIDE - Abnormal; Notable for the following components:   B Natriuretic Peptide 170.5 (*)    All other components within normal limits  D-DIMER, QUANTITATIVE - Abnormal; Notable for the following components:   D-Dimer, Quant 1.05 (*)    All other components within normal limits  SARS CORONAVIRUS 2 BY RT PCR  LIPASE, BLOOD  TROPONIN I (HIGH SENSITIVITY)  TROPONIN I (HIGH SENSITIVITY)     EKG  ED ECG REPORT I, Willy Eddy, the attending physician, personally viewed and interpreted this ECG.  Date: 06/29/2022  EKG Time: 9:59  Rate: 60  Rhythm: sinus  Axis: normal  Intervals: normal  ST&T Change: no stemi, no depressions    RADIOLOGY Please see ED Course for my review and interpretation.  I personally reviewed all radiographic images ordered to evaluate for the above acute complaints and reviewed radiology reports and findings.  These findings were personally discussed with the patient.  Please see medical record for radiology report.    PROCEDURES:  Critical Care performed: No  Procedures   MEDICATIONS ORDERED IN ED: Medications  ondansetron (ZOFRAN) injection 4 mg (4 mg Intravenous Given 06/29/22 1038)  ipratropium-albuterol (DUONEB) 0.5-2.5 (3) MG/3ML nebulizer solution 3 mL (3 mLs Nebulization Given 06/29/22 1124)  alum & mag hydroxide-simeth (MAALOX/MYLANTA) 200-200-20 MG/5ML suspension 30 mL (30 mLs Oral Given  06/29/22 1124)  iohexol (OMNIPAQUE) 350 MG/ML injection 100 mL (75 mLs Intravenous Contrast Given 06/29/22 1209)  metoCLOPramide (REGLAN) injection 10 mg (10 mg Intravenous Given 06/29/22 1241)  ipratropium-albuterol (DUONEB) 0.5-2.5 (3) MG/3ML nebulizer solution 3 mL (3 mLs Nebulization Given 06/29/22 1354)     IMPRESSION / MDM / ASSESSMENT AND PLAN / ED COURSE  I reviewed the triage vital signs and the nursing notes.                              Differential diagnosis includes, but is not limited to, pna, bronchitis, ptx, chf, pancreatitis, enteritis, sbo, dehydration  Patient presenting to the ER for evaluation of symptoms as described above.  Based on symptoms, risk factors and considered above differential, this presenting complaint could reflect a potentially life-threatening illness therefore the patient will be placed on continuous pulse oximetry and telemetry for monitoring.  Laboratory evaluation will be sent to evaluate for the above complaints.      Clinical Course as of 06/29/22 1500  Sat Jun 29, 2022  1022 Chest x-ray on my review and interpretation without evidence of consolidation.  Will await formal radiology report. [PR]  1059 Patient does endorse a history of bronchitis.  Will trial nebulizer.  Repeat abdominal exam is benign. [PR]  1240 CTA my review and interpretation without any evidence of large PE will await formal radiology report. [PR]  1318 CT with no explanation for the patient's shortness of breath nausea and vomiting.  No consolidation.  No significant edema.  Given her history of bronchitis will give additional nebulizer treatment.  As it was poorly timed for PE study will order D-dimer to further rule stratify as that remains on the differential. [PR]  1459 Patient reassessed.  She feels well.  Lower extremity duplex negative for PE.  She is been weaned off her oxygen and she is not hypoxic not tachycardic.  Her D-dimer does appear to be mildly elevated  chronically.  I have a very low suspicion for PE at this time given workup thus far.  She feels comfortable discharge home and I think given her otherwise reassuring workup that would be appropriate as it seems that most of her complaints were related to nausea which have subsided. [PR]    Clinical Course User Index [PR] Willy Eddy, MD     FINAL CLINICAL IMPRESSION(S) / ED DIAGNOSES   Final diagnoses:  Nausea and vomiting, unspecified vomiting type     Rx / DC Orders   ED Discharge Orders          Ordered    metoCLOPramide (REGLAN) 10 MG tablet  3 times daily  with meals        06/29/22 1459             Note:  This document was prepared using Dragon voice recognition software and may include unintentional dictation errors.    Willy Eddy, MD 06/29/22 1500

## 2022-06-29 NOTE — ED Triage Notes (Signed)
Pt had ERCP on Thursday and has been vomiting, chills and had SOB since procedure. Denies diarrhea

## 2022-06-30 DIAGNOSIS — K9189 Other postprocedural complications and disorders of digestive system: Secondary | ICD-10-CM | POA: Diagnosis present

## 2022-06-30 DIAGNOSIS — R109 Unspecified abdominal pain: Secondary | ICD-10-CM | POA: Diagnosis not present

## 2022-06-30 DIAGNOSIS — Z794 Long term (current) use of insulin: Secondary | ICD-10-CM | POA: Diagnosis not present

## 2022-06-30 DIAGNOSIS — I5022 Chronic systolic (congestive) heart failure: Secondary | ICD-10-CM | POA: Diagnosis not present

## 2022-06-30 DIAGNOSIS — K567 Ileus, unspecified: Secondary | ICD-10-CM | POA: Diagnosis present

## 2022-06-30 DIAGNOSIS — I10 Essential (primary) hypertension: Secondary | ICD-10-CM | POA: Diagnosis not present

## 2022-06-30 DIAGNOSIS — I5032 Chronic diastolic (congestive) heart failure: Secondary | ICD-10-CM | POA: Diagnosis present

## 2022-06-30 DIAGNOSIS — J811 Chronic pulmonary edema: Secondary | ICD-10-CM | POA: Diagnosis not present

## 2022-06-30 DIAGNOSIS — R197 Diarrhea, unspecified: Secondary | ICD-10-CM | POA: Diagnosis not present

## 2022-06-30 DIAGNOSIS — E119 Type 2 diabetes mellitus without complications: Secondary | ICD-10-CM | POA: Diagnosis present

## 2022-06-30 DIAGNOSIS — Z6841 Body Mass Index (BMI) 40.0 and over, adult: Secondary | ICD-10-CM | POA: Diagnosis not present

## 2022-06-30 DIAGNOSIS — Z7984 Long term (current) use of oral hypoglycemic drugs: Secondary | ICD-10-CM | POA: Diagnosis not present

## 2022-06-30 DIAGNOSIS — I110 Hypertensive heart disease with heart failure: Secondary | ICD-10-CM | POA: Diagnosis present

## 2022-06-30 DIAGNOSIS — I81 Portal vein thrombosis: Secondary | ICD-10-CM | POA: Diagnosis not present

## 2022-06-30 DIAGNOSIS — I517 Cardiomegaly: Secondary | ICD-10-CM | POA: Diagnosis not present

## 2022-06-30 DIAGNOSIS — Z1152 Encounter for screening for COVID-19: Secondary | ICD-10-CM | POA: Diagnosis not present

## 2022-06-30 DIAGNOSIS — R0902 Hypoxemia: Secondary | ICD-10-CM | POA: Diagnosis present

## 2022-06-30 DIAGNOSIS — E785 Hyperlipidemia, unspecified: Secondary | ICD-10-CM | POA: Diagnosis present

## 2022-06-30 DIAGNOSIS — R0602 Shortness of breath: Secondary | ICD-10-CM | POA: Diagnosis present

## 2022-06-30 DIAGNOSIS — R112 Nausea with vomiting, unspecified: Secondary | ICD-10-CM | POA: Diagnosis not present

## 2022-07-01 DIAGNOSIS — I110 Hypertensive heart disease with heart failure: Secondary | ICD-10-CM | POA: Diagnosis present

## 2022-07-01 DIAGNOSIS — K9189 Other postprocedural complications and disorders of digestive system: Secondary | ICD-10-CM | POA: Diagnosis present

## 2022-07-01 DIAGNOSIS — I81 Portal vein thrombosis: Secondary | ICD-10-CM | POA: Diagnosis not present

## 2022-07-01 DIAGNOSIS — Z1152 Encounter for screening for COVID-19: Secondary | ICD-10-CM | POA: Diagnosis not present

## 2022-07-01 DIAGNOSIS — K567 Ileus, unspecified: Secondary | ICD-10-CM | POA: Diagnosis present

## 2022-07-01 DIAGNOSIS — E785 Hyperlipidemia, unspecified: Secondary | ICD-10-CM | POA: Diagnosis present

## 2022-07-01 DIAGNOSIS — E119 Type 2 diabetes mellitus without complications: Secondary | ICD-10-CM | POA: Diagnosis present

## 2022-07-01 DIAGNOSIS — Z7984 Long term (current) use of oral hypoglycemic drugs: Secondary | ICD-10-CM | POA: Diagnosis not present

## 2022-07-01 DIAGNOSIS — I10 Essential (primary) hypertension: Secondary | ICD-10-CM | POA: Diagnosis not present

## 2022-07-01 DIAGNOSIS — R0602 Shortness of breath: Secondary | ICD-10-CM | POA: Diagnosis present

## 2022-07-01 DIAGNOSIS — Z6841 Body Mass Index (BMI) 40.0 and over, adult: Secondary | ICD-10-CM | POA: Diagnosis not present

## 2022-07-01 DIAGNOSIS — I5032 Chronic diastolic (congestive) heart failure: Secondary | ICD-10-CM | POA: Diagnosis present

## 2022-07-01 DIAGNOSIS — R112 Nausea with vomiting, unspecified: Secondary | ICD-10-CM | POA: Diagnosis not present

## 2022-07-01 DIAGNOSIS — R0902 Hypoxemia: Secondary | ICD-10-CM | POA: Diagnosis present

## 2022-07-01 DIAGNOSIS — I5022 Chronic systolic (congestive) heart failure: Secondary | ICD-10-CM | POA: Diagnosis not present

## 2022-07-01 DIAGNOSIS — Z794 Long term (current) use of insulin: Secondary | ICD-10-CM | POA: Diagnosis not present

## 2022-07-01 LAB — CYTOLOGY - NON PAP

## 2022-07-04 ENCOUNTER — Encounter: Payer: Self-pay | Admitting: Gastroenterology

## 2022-07-04 ENCOUNTER — Ambulatory Visit: Payer: Self-pay

## 2022-07-04 ENCOUNTER — Telehealth: Payer: Self-pay | Admitting: *Deleted

## 2022-07-04 NOTE — Transitions of Care (Post Inpatient/ED Visit) (Signed)
07/04/2022  Name: Sara Zhang MRN: [id number] DOB: 1971-01-17  Today's TOC FU Call Status: Today's TOC FU Call Status:: Successful TOC FU Call Competed TOC FU Call Complete Date: 07/04/22  Transition Care Management Follow-up Telephone Call    Items Reviewed: Did you receive and understand the discharge instructions provided?: Yes Medications obtained,verified, and reconciled?: Yes (Medications Reviewed) Any new allergies since your discharge?: No Dietary orders reviewed?: No Do you have support at home?: Yes People in Home: alone Name of Support/Comfort Primary Source: Orbit father  Medications Reviewed Today: Medications Reviewed Today     Reviewed by Luella Cook, RN (Case Manager) on 07/04/22 at 1601  Med List Status: <None>   Medication Order Taking? Sig Documenting Provider Last Dose Status Informant  albuterol (VENTOLIN HFA) 108 (90 Base) MCG/ACT inhaler [id number] Yes INHALE 2 PUFFS INTO THE LUNGS EVERY 6 HOURS AS NEEDED FOR WHEEZING OR SHORTNESS OF BREATH Cannady, Jolene T, NP Taking Active   amLODipine (NORVASC) 5 MG tablet [id number] Yes TAKE 1 TABLET(5 MG) BY MOUTH DAILY End, Cristal Deer, MD Taking Active   atenolol (TENORMIN) 50 MG tablet [id number] Yes TAKE 1 TABLET(50 MG) BY MOUTH DAILY Cannady, Jolene T, NP Taking Active   atorvastatin (LIPITOR) 10 MG tablet [id number] Yes TAKE 1 TABLET(10 MG) BY MOUTH DAILY Cannady, Jolene T, NP Taking Active   Blood Glucose Monitoring Suppl (CONTOUR NEXT MONITOR) w/Device KIT [id number]  To check blood sugar 4 times daily. Aura Dials T, NP  Active   Calcium Carbonate-Vitamin D (OYSTER SHELL CALCIUM 500 + D) 500-125 MG-UNIT TABS [id number] Yes Take 1 tablet by mouth daily.  [provider] Taking Active Self  Cholecalciferol (VITAMIN D3) 10 MCG (400 UNIT) tablet [id number] Yes Take 400 Units by mouth daily. [provider] Taking Active Self  Dulaglutide (TRULICITY) 3 MG/0.5ML SOPN [id number] Yes Inject 3  mg as directed once a week. Aura Dials T, NP Taking Active   furosemide (LASIX) 40 MG tablet [id number] Yes TAKE 1 TABLET(40 MG) BY MOUTH TWICE DAILY End, Christopher, MD Taking Active   glucose blood (CONTOUR NEXT TEST) test strip [id number] Yes TEST BLOOD SUGAR ONCE DAILY Cannady, Jolene T, NP Taking Active Self  HYDROcodone-acetaminophen (NORCO/VICODIN) 5-325 MG tablet [id number] Yes Take 1 tablet by mouth every 6 (six) hours as needed for severe pain. Aura Dials T, NP Taking Active   Insulin Glargine (BASAGLAR KWIKPEN) 100 UNIT/ML [id number] Yes Inject 52 Units into the skin at bedtime. [provider] Taking Active   Insulin Pen Needle 32G X 4 MM MISC [id number]  1 Units by Does not apply route every morning. Pen needles Gabriel Cirri, NP  Active Self  JARDIANCE 25 MG TABS tablet [id number] Yes TAKE 1 TABLET(25 MG) BY MOUTH DAILY Cannady, Dorie Rank, NP Taking Active   Lancets (ONETOUCH ULTRASOFT) lancets [id number]  Use as instructed Marjie Skiff, NP  Active   levonorgestrel (MIRENA) 20 MCG/24HR IUD [id number] Yes 1 each by Intrauterine route once.  [provider] Taking Active Self  losartan (COZAAR) 25 MG tablet [id number] Yes TAKE 1 TABLET(25 MG) BY MOUTH DAILY Cannady, Jolene T, NP Taking Active   metoCLOPramide (REGLAN) 10 MG tablet [id number] Yes Take 1 tablet (10 mg total) by mouth 3 (three) times daily with meals. Willy Eddy, MD Taking Active   ondansetron [facility]) 4 MG tablet [id number] Yes Take 1 tablet (4 mg total) by mouth every 8 (eight) hours as needed for nausea or vomiting. Midge Minium, MD Taking Active   OXYGEN  [id number] Yes Inhale 2 L into the lungs daily. [provider] Taking Active Self           Med Note Barrington Ellison Apr 16, 2019  9:42 AM)    Med List Note Halford Decamp, [place] 02/03/19 1401): Bipap            Home Care and Equipment/Supplies: Any new equipment or medical supplies ordered?: NA  Functional  Questionnaire: Do you need assistance with bathing/showering or dressing?: No Do you need assistance with meal preparation?: No Do you need assistance with eating?: No Do you have difficulty maintaining continence: No Do you need assistance with getting out of bed/getting out of a chair/moving?: No Do you have difficulty managing or taking your medications?: No  Follow up appointments reviewed: PCP Follow-up appointment confirmed?: No MD Provider Line Number:[phone] Given: Yes (Patient refused RN to help make appointment) Specialist Hospital Follow-up appointment confirmed?: No Reason Specialist Follow-Up Not Confirmed: Patient has Specialist Provider Number and will Call for Appointment Do you need transportation to your follow-up appointment?: No Do you understand care options if your condition(s) worsen?: Yes-patient verbalized understanding  SDOH Interventions Today    Flowsheet Row Most Recent Value  SDOH Interventions   Food Insecurity Interventions Intervention Not Indicated  Housing Interventions Intervention Not Indicated  Transportation Interventions Intervention Not Indicated, Patient Resources (Friends/Family)      Interventions Today    Flowsheet Row Most Recent Value  General Interventions   General Interventions Discussed/Reviewed General Interventions Discussed, General Interventions Reviewed, Doctor Visits  Doctor Visits Discussed/Reviewed Doctor Visits Discussed, Doctor Visits Reviewed  [Patient refused RN to assist with making appointments]  Pharmacy Interventions   Pharmacy Dicussed/Reviewed Pharmacy Topics Discussed      TOC Interventions Today    Flowsheet Row Most Recent Value  TOC Interventions   TOC Interventions Discussed/Reviewed TOC Interventions Discussed, TOC Interventions Reviewed       Gean Maidens BSN RN [facility] [phone]

## 2022-07-04 NOTE — Chronic Care Management (AMB) (Signed)
   07/04/2022  [name] 1971-01-09 [id number]   Reason for Encounter: Patient is not currently enrolled in the CCM program. CCM status changed to previously enrolled  Alto Denver RN, MSN, CCM RN Care Manager  Chronic Care Management Direct Number: [phone]

## 2022-07-09 ENCOUNTER — Telehealth: Payer: Self-pay | Admitting: Nurse Practitioner

## 2022-07-09 NOTE — Telephone Encounter (Unsigned)
Copied from CRM [id number]. Topic: Appointment Scheduling - Scheduling Inquiry for Clinic >> Jul 09, 2022  1:55 PM Macon Large wrote: Reason for CRM: Pt was discharged from [facility] on 07/[facility]/22 and requests hospital fu appt with Jolene. There are no hospital fu appts available with Jolene within the next 2 weeks. Please contact pt to schedule hospital fu appt.

## 2022-07-10 NOTE — Telephone Encounter (Signed)
Called and scheduled patient on 07/18/2022 for HFU.

## 2022-07-14 NOTE — Patient Instructions (Signed)
Diabetes Mellitus Basics  Diabetes mellitus, or diabetes, is a long-term (chronic) disease. It occurs when the body does not properly use sugar (glucose) that is released from food after you eat. Diabetes mellitus may be caused by one or both of these problems: Your pancreas does not make enough of a hormone called insulin. Your body does not react in a normal way to the insulin that it makes. Insulin lets glucose enter cells in your body. This gives you energy. If you have diabetes, glucose cannot get into cells. This causes high blood glucose (hyperglycemia). How to treat and manage diabetes You may need to take insulin or other diabetes medicines daily to keep your glucose in balance. If you are prescribed insulin, you will learn how to give yourself insulin by injection. You may need to adjust the amount of insulin you take based on the foods that you eat. You will need to check your blood glucose levels using a glucose monitor as told by your health care provider. The readings can help determine if you have low or high blood glucose. Generally, you should have these blood glucose levels: Before meals (preprandial): 80-130 mg/dL (4.4-7.2 mmol/L). After meals (postprandial): below 180 mg/dL (10 mmol/L). Hemoglobin A1c (HbA1c) level: less than 7%. Your health care provider will set treatment goals for you. Keep all follow-up visits. This is important. Follow these instructions at home: Diabetes medicines Take your diabetes medicines every day as told by your health care provider. List your diabetes medicines here: Name of medicine: ______________________________ Amount (dose): _______________ Time (a.m./p.m.): _______________ Notes: ___________________________________ Name of medicine: ______________________________ Amount (dose): _______________ Time (a.m./p.m.): _______________ Notes: ___________________________________ Name of medicine: ______________________________ Amount (dose):  _______________ Time (a.m./p.m.): _______________ Notes: ___________________________________ Insulin If you use insulin, list the types of insulin you use here: Insulin type: ______________________________ Amount (dose): _______________ Time (a.m./p.m.): _______________Notes: ___________________________________ Insulin type: ______________________________ Amount (dose): _______________ Time (a.m./p.m.): _______________ Notes: ___________________________________ Insulin type: ______________________________ Amount (dose): _______________ Time (a.m./p.m.): _______________ Notes: ___________________________________ Insulin type: ______________________________ Amount (dose): _______________ Time (a.m./p.m.): _______________ Notes: ___________________________________ Insulin type: ______________________________ Amount (dose): _______________ Time (a.m./p.m.): _______________ Notes: ___________________________________ Managing blood glucose  Check your blood glucose levels using a glucose monitor as told by your health care provider. Write down the times that you check your glucose levels here: Time: _______________ Notes: ___________________________________ Time: _______________ Notes: ___________________________________ Time: _______________ Notes: ___________________________________ Time: _______________ Notes: ___________________________________ Time: _______________ Notes: ___________________________________ Time: _______________ Notes: ___________________________________  Low blood glucose Low blood glucose (hypoglycemia) is when glucose is at or below 70 mg/dL (3.9 mmol/L). Symptoms may include: Feeling: Hungry. Sweaty and clammy. Irritable or easily upset. Dizzy. Sleepy. Having: A fast heartbeat. A headache. A change in your vision. Numbness around the mouth, lips, or tongue. Having trouble with: Moving (coordination). Sleeping. Treating low blood glucose To treat low blood  glucose, eat or drink something containing sugar right away. If you can think clearly and swallow safely, follow the 15:15 rule: Take 15 grams of a fast-acting carb (carbohydrate), as told by your health care provider. Some fast-acting carbs are: Glucose tablets: take 3-4 tablets. Hard candy: eat 3-5 pieces. Fruit juice: drink 4 oz (120 mL). Regular (not diet) soda: drink 4-6 oz (120-180 mL). Honey or sugar: eat 1 Tbsp (15 mL). Check your blood glucose levels 15 minutes after you take the carb. If your glucose is still at or below 70 mg/dL (3.9 mmol/L), take 15 grams of a carb again. If your glucose does not go above 70 mg/dL (3.9 mmol/L) after   3 tries, get help right away. After your glucose goes back to normal, eat a meal or a snack within 1 hour. Treating very low blood glucose If your glucose is at or below 54 mg/dL (3 mmol/L), you have very low blood glucose (severe hypoglycemia). This is an emergency. Do not wait to see if the symptoms will go away. Get medical help right away. Call your local emergency services (911 in the [place] Do not drive yourself to the hospital. Questions to ask your health care provider Should I talk with a diabetes educator? What equipment will I need to care for myself at home? What diabetes medicines do I need? When should I take them? How often do I need to check my blood glucose levels? What number can I call if I have questions? When is my follow-up visit? Where can I find a support group for people with diabetes? Where to find more information American Diabetes Association: www.diabetes.org Association of Diabetes Care and Education Specialists: www.diabeteseducator.org Contact a health care provider if: Your blood glucose is at or above 240 mg/dL (13.3 mmol/L) for 2 days in a row. You have been sick or have had a fever for 2 days or more, and you are not getting better. You have any of these problems for more than 6 hours: You cannot eat or  drink. You feel nauseous. You vomit. You have diarrhea. Get help right away if: Your blood glucose is lower than 54 mg/dL (3 mmol/L). You get confused. You have trouble thinking clearly. You have trouble breathing. These symptoms may represent a serious problem that is an emergency. Do not wait to see if the symptoms will go away. Get medical help right away. Call your local emergency services (911 in the [place] Do not drive yourself to the hospital. Summary Diabetes mellitus is a chronic disease that occurs when the body does not properly use sugar (glucose) that is released from food after you eat. Take insulin and diabetes medicines as told. Check your blood glucose every day, as often as told. Keep all follow-up visits. This is important. This information is not intended to replace advice given to you by your health care provider. Make sure you discuss any questions you have with your health care provider. Document Revised: 05/25/2019 Document Reviewed: 05/25/2019 Elsevier Patient Education  2024 Elsevier Inc.  

## 2022-07-16 ENCOUNTER — Telehealth: Payer: Self-pay | Admitting: Nurse Practitioner

## 2022-07-16 NOTE — Telephone Encounter (Signed)
Copied from CRM [id number]. Topic: General - Other >> Jul 15, 2022  4:03 PM Dondra Prader A wrote: Reason for CRM: Lanora Manis with [employer] is calling to see if pt PCP received the fax that she sent over for the pt on 07/09/22. Please advise.

## 2022-07-16 NOTE — Telephone Encounter (Signed)
Returned call to [facility] and informed her that we have not received a fax from her about patient.

## 2022-07-18 ENCOUNTER — Ambulatory Visit (INDEPENDENT_AMBULATORY_CARE_PROVIDER_SITE_OTHER): Payer: Medicare Other | Admitting: Nurse Practitioner

## 2022-07-18 ENCOUNTER — Encounter: Payer: Self-pay | Admitting: Nurse Practitioner

## 2022-07-18 VITALS — BP 102/67 | HR 67 | Temp 98.3°F | Ht 69.02 in | Wt 346.8 lb

## 2022-07-18 DIAGNOSIS — R112 Nausea with vomiting, unspecified: Secondary | ICD-10-CM | POA: Diagnosis not present

## 2022-07-18 DIAGNOSIS — R1011 Right upper quadrant pain: Secondary | ICD-10-CM

## 2022-07-18 NOTE — Progress Notes (Signed)
BP 102/67   Pulse 67   Temp 98.3 F (36.8 C) (Oral)   Ht 5' 9.02" (1.753 m)   Wt (!) 346 lb 12.8 oz (157.3 kg)   LMP 05/31/2022 (Approximate)   SpO2 92%   BMI 51.19 kg/m    Subjective:    Patient ID: Sara Zhang, female    DOB: 1970-05-24, 52 y.o.   MRN: [id number]  HPI: Sara Zhang is a 52 y.o. female  Chief Complaint  Patient presents with   Hospitalization Follow-up    Nausea, vomiting and SOB. Patient states that she is feeling tired since hosp visit   Transition of Care Hospital Follow up.  Admitted to [facility] on 06/30/22 and discharged on 07/03/22.  Was admitted due to N&V that presented after ERCP which was performed on 06/27/22 with Dr. Servando Snare.  Initially went to [facility] ER on 06/29/22 and was discharged home.  She reports the N&V presented immediately after procedure when she was in recovery room, but was discharged home.  When at [facility] was given IV hydration and bowel regimen, due to suspicion for ileus.  Was able to pass bowels and noticed improvement in symptoms after this.   At this time she reports fatigue, but no further N&V.  She is having a BM every other day.  No straining or blood in stool.  Denies any abdominal pain, does notice abdomen is a little bloated.    "[name] is a 52 year old female with PMH of HFrecEF, HTN, HLD, type 2 DM, chronic hypoxemia who recently underwent ERCP to evaluate biliary ductal dilatation. She had immediate nausea/vomiting after her outpatient procedure which persisted for 3 days without improvement, so presented to the ED for further management. ERCP report reviewed and no documented complications. CT A/P in the ED showed no findings consistent with typical post-ERCP complications. She did have RUQ [place] based on question of PVT vs biliary duct dilatation, which was negative for PVT. On exam, she had very hypoactive bowel sounds and had not had a BM since before her procedure. Clinically was consistent with ileus. She was treated with IVF,  antiemetics, and a bowel regimen. She gradually was able to tolerate a regular diet and had return of bowel function. She was discharged with oral antiemetics and instructed to follow-up as an outpatient with her PCP and GI."  Hospital/Facility: [facility] D/C Physician: Dr. Talbert Cage D/C Date: 07/03/22  Records Requested: 07/18/22 Records Received: 07/18/22 Records Reviewed: 07/18/22  Diagnoses on Discharge: Nausea and vomiting  Date of interactive Contact within 48 hours of discharge:  Contact was through: phone  Date of 7 day or 14 day face-to-face visit:     15 days  Outpatient Encounter Medications as of 07/18/2022  Medication Sig   albuterol (VENTOLIN HFA) 108 (90 Base) MCG/ACT inhaler INHALE 2 PUFFS INTO THE LUNGS EVERY 6 HOURS AS NEEDED FOR WHEEZING OR SHORTNESS OF BREATH   amLODipine (NORVASC) 5 MG tablet TAKE 1 TABLET(5 MG) BY MOUTH DAILY   atenolol (TENORMIN) 50 MG tablet TAKE 1 TABLET(50 MG) BY MOUTH DAILY   atorvastatin (LIPITOR) 10 MG tablet TAKE 1 TABLET(10 MG) BY MOUTH DAILY   Blood Glucose Monitoring Suppl (CONTOUR NEXT MONITOR) w/Device KIT To check blood sugar 4 times daily.   Calcium Carbonate-Vitamin D (OYSTER SHELL CALCIUM 500 + D) 500-125 MG-UNIT TABS Take 1 tablet by mouth daily.    Cholecalciferol (VITAMIN D3) 10 MCG (400 UNIT) tablet Take 400 Units by mouth daily.   Dulaglutide (  TRULICITY) 3 MG/0.5ML SOPN Inject 3 mg as directed once a week.   ferrous sulfate 325 (65 FE) MG tablet Take 325 mg by mouth daily with breakfast.   furosemide (LASIX) 40 MG tablet TAKE 1 TABLET(40 MG) BY MOUTH TWICE DAILY   glucose blood (CONTOUR NEXT TEST) test strip TEST BLOOD SUGAR ONCE DAILY   Insulin Glargine (BASAGLAR KWIKPEN) 100 UNIT/ML Inject 52 Units into the skin at bedtime.   Insulin Pen Needle 32G X 4 MM MISC 1 Units by Does not apply route every morning. Pen needles   JARDIANCE 25 MG TABS tablet TAKE 1 TABLET(25 MG) BY MOUTH DAILY   Lancets (ONETOUCH ULTRASOFT)  lancets Use as instructed   levonorgestrel (MIRENA) 20 MCG/24HR IUD 1 each by Intrauterine route once.    losartan (COZAAR) 25 MG tablet TAKE 1 TABLET(25 MG) BY MOUTH DAILY   metoCLOPramide (REGLAN) 10 MG tablet Take 1 tablet (10 mg total) by mouth 3 (three) times daily with meals.   OXYGEN Inhale 2 L into the lungs daily.   [DISCONTINUED] HYDROcodone-acetaminophen (NORCO/VICODIN) 5-325 MG tablet Take 1 tablet by mouth every 6 (six) hours as needed for severe pain.   [DISCONTINUED] ondansetron (ZOFRAN) 4 MG tablet Take 1 tablet (4 mg total) by mouth every 8 (eight) hours as needed for nausea or vomiting. (Patient not taking: Reported on 07/18/2022)   No facility-administered encounter medications on file as of 07/18/2022.    Diagnostic Tests Reviewed/Disposition: Reviewed within chart  Consults: GI  Discharge Instructions: Follow-up with PCP and GI  Disease/illness Education: Reviewed with patient in office today  Home Health/Community Services Discussions/Referrals: None  Establishment or re-establishment of referral orders for community resources: None  Discussion with other health care providers: Reviewed all recent notes within chart  Assessment and Support of treatment regimen adherence: Reviewed with patient in office today  Appointments Coordinated with: Reviewed with patient in office today  Education for self-management, independent living, and ADLs:  Reviewed with patient in office today  Relevant past medical, surgical, family and social history reviewed and updated as indicated. Interim medical history since our last visit reviewed. Allergies and medications reviewed and updated.  Review of Systems  Constitutional:  Positive for fatigue. Negative for activity change, appetite change, chills, fever and unexpected weight change.  Respiratory:  Negative for cough, chest tightness, shortness of breath and wheezing.   Cardiovascular:  Negative for chest pain, palpitations  and leg swelling.  Gastrointestinal:  Positive for abdominal distention. Negative for abdominal pain, blood in stool, constipation, diarrhea, nausea and vomiting.  Endocrine: Negative for polydipsia, polyphagia and polyuria.  Neurological: Negative.   Psychiatric/Behavioral: Negative.     Per HPI unless specifically indicated above     Objective:    BP 102/67   Pulse 67   Temp 98.3 F (36.8 C) (Oral)   Ht 5' 9.02" (1.753 m)   Wt (!) 346 lb 12.8 oz (157.3 kg)   LMP 05/31/2022 (Approximate)   SpO2 92%   BMI 51.19 kg/m   Wt Readings from Last 3 Encounters:  07/18/22 (!) 346 lb 12.8 oz (157.3 kg)  06/29/22 (!) 352 lb 11.8 oz (160 kg)  06/27/22 (!) 354 lb (160.6 kg)    Physical Exam Vitals and nursing note reviewed.  Constitutional:      General: She is awake. She is not in acute distress.    Appearance: She is well-developed and well-groomed. She is morbidly obese. She is not ill-appearing or toxic-appearing.  HENT:     Head:  Normocephalic.     Right Ear: Hearing and external ear normal.     Left Ear: Hearing and external ear normal.  Eyes:     General: Lids are normal. No scleral icterus.       Right eye: No discharge.        Left eye: No discharge.     Conjunctiva/sclera: Conjunctivae normal.     Pupils: Pupils are equal, round, and reactive to light.  Neck:     Thyroid: No thyromegaly.     Vascular: No carotid bruit.  Cardiovascular:     Rate and Rhythm: Normal rate and regular rhythm.     Heart sounds: Normal heart sounds. No murmur heard.    No gallop.  Pulmonary:     Effort: Pulmonary effort is normal. No accessory muscle usage or respiratory distress.     Breath sounds: Normal breath sounds. No decreased breath sounds, wheezing or rhonchi.  Abdominal:     General: Abdomen is protuberant. Bowel sounds are normal. There is no distension.     Palpations: Abdomen is soft.     Tenderness: There is no abdominal tenderness. There is no right CVA tenderness, left CVA  tenderness, guarding or rebound. Negative signs include Murphy's sign.     Comments: No tenderness to abdomen today.  Musculoskeletal:     Cervical back: Normal range of motion and neck supple.     Right lower leg: Edema (trace) present.     Left lower leg: Edema (trace) present.  Lymphadenopathy:     Cervical: No cervical adenopathy.  Skin:    General: Skin is warm and dry.     Capillary Refill: Capillary refill takes less than 2 seconds.  Neurological:     Mental Status: She is alert and oriented to person, place, and time.  Psychiatric:        Attention and Perception: Attention normal.        Mood and Affect: Mood and affect normal.        Speech: Speech normal.        Behavior: Behavior normal. Behavior is cooperative.        Thought Content: Thought content normal.     Results for orders placed or performed during the hospital encounter of 06/29/22  SARS Coronavirus 2 by RT PCR (hospital order, performed in [facility] hospital lab) *cepheid single result test* Anterior Nasal Swab   Specimen: Anterior Nasal Swab  Result Value Ref Range   SARS Coronavirus 2 by RT PCR NEGATIVE NEGATIVE  CBC with Differential  Result Value Ref Range   WBC 10.9 (H) 4.0 - 10.5 K/uL   RBC 4.72 3.87 - 5.11 MIL/uL   Hemoglobin 12.3 12.0 - 15.0 g/dL   HCT 38.7 56.4 - 33.2 %   MCV 87.1 80.0 - 100.0 fL   MCH 26.1 26.0 - 34.0 pg   MCHC 29.9 (L) 30.0 - 36.0 g/dL   RDW 95.1 88.4 - 16.6 %   Platelets 342 150 - 400 K/uL   nRBC 0.0 0.0 - 0.2 %   Neutrophils Relative % 79 %   Neutro Abs 8.6 (H) 1.7 - 7.7 K/uL   Lymphocytes Relative 14 %   Lymphs Abs 1.5 0.7 - 4.0 K/uL   Monocytes Relative 6 %   Monocytes Absolute 0.7 0.1 - 1.0 K/uL   Eosinophils Relative 0 %   Eosinophils Absolute 0.0 0.0 - 0.5 K/uL   Basophils Relative 0 %   Basophils Absolute 0.0 0.0 - 0.1 K/uL  Immature Granulocytes 1 %   Abs Immature Granulocytes 0.08 (H) 0.00 - 0.07 K/uL  Comprehensive metabolic panel  Result Value Ref  Range   Sodium 137 135 - 145 mmol/L   Potassium 4.7 3.5 - 5.1 mmol/L   Chloride 100 98 - 111 mmol/L   CO2 29 22 - 32 mmol/L   Glucose, Bld 267 (H) 70 - 99 mg/dL   BUN 17 6 - 20 mg/dL   Creatinine, Ser 1.91 0.44 - 1.00 mg/dL   Calcium 8.7 (L) 8.9 - 10.3 mg/dL   Total Protein 8.2 (H) 6.5 - 8.1 g/dL   Albumin 3.5 3.5 - 5.0 g/dL   AST 21 15 - 41 U/L   ALT 17 0 - 44 U/L   Alkaline Phosphatase 84 38 - 126 U/L   Total Bilirubin 1.0 0.3 - 1.2 mg/dL   GFR, Estimated >47 >82 mL/min   Anion gap 8 5 - 15  Lipase, blood  Result Value Ref Range   Lipase 30 11 - 51 U/L  Brain natriuretic peptide  Result Value Ref Range   B Natriuretic Peptide 170.5 (H) 0.0 - 100.0 pg/mL  D-dimer, quantitative  Result Value Ref Range   D-Dimer, Quant 1.05 (H) 0.00 - 0.50 ug/mL-FEU  Troponin I (High Sensitivity)  Result Value Ref Range   Troponin I (High Sensitivity) 12 <18 ng/L  Troponin I (High Sensitivity)  Result Value Ref Range   Troponin I (High Sensitivity) 11 <18 ng/L      Assessment & Plan:   Problem List Items Addressed This Visit       Digestive   Nausea and vomiting in adult - Primary    Acute and overall improved at this time, with no further episodes.  Recommend she stay on bowel regimen at home and ensure good hydration daily.  Continue to collaborate with GI, she will schedule follow-up with them.      Relevant Orders   CBC with Differential/Platelet   Comprehensive metabolic panel     Other   RUQ abdominal pain    Acute and improved, continue to collaborate with GI. She plans to schedule follow-up with them.  CBC and CMP today.       Time: 25 minutes, >50% spent counseling/or care coordination   Follow up plan: Return for As scheduled July 17th.

## 2022-07-18 NOTE — Assessment & Plan Note (Signed)
Acute and overall improved at this time, with no further episodes.  Recommend she stay on bowel regimen at home and ensure good hydration daily.  Continue to collaborate with GI, she will schedule follow-up with them.

## 2022-07-18 NOTE — Assessment & Plan Note (Signed)
Acute and improved, continue to collaborate with GI. She plans to schedule follow-up with them.  CBC and CMP today.

## 2022-07-19 ENCOUNTER — Ambulatory Visit
Admission: RE | Admit: 2022-07-19 | Discharge: 2022-07-19 | Disposition: A | Discharge status: Home / Self Care | Payer: Medicare Other | Source: Ambulatory Visit | Attending: Nurse Practitioner | Admitting: Nurse Practitioner

## 2022-07-19 DIAGNOSIS — Z1231 Encounter for screening mammogram for malignant neoplasm of breast: Secondary | ICD-10-CM | POA: Diagnosis not present

## 2022-07-19 LAB — COMPREHENSIVE METABOLIC PANEL
ALT: 14 IU/L (ref 0–32)
AST: 17 IU/L (ref 0–40)
Albumin/Globulin Ratio: 1.1
Albumin: 3.6 g/dL — ABNORMAL LOW (ref 3.8–4.9)
Alkaline Phosphatase: 109 IU/L (ref 44–121)
BUN/Creatinine Ratio: 18 (ref 9–23)
BUN: 19 mg/dL (ref 6–24)
Bilirubin Total: 0.3 mg/dL (ref 0.0–1.2)
CO2: 24 mmol/L (ref 20–29)
Calcium: 8.7 mg/dL (ref 8.7–10.2)
Chloride: 101 mmol/L (ref 96–106)
Creatinine, Ser: 1.04 mg/dL — ABNORMAL HIGH (ref 0.57–1.00)
Globulin, Total: 3.2 g/dL (ref 1.5–4.5)
Glucose: 115 mg/dL — ABNORMAL HIGH (ref 70–99)
Potassium: 4 mmol/L (ref 3.5–5.2)
Sodium: 140 mmol/L (ref 134–144)
Total Protein: 6.8 g/dL (ref 6.0–8.5)
eGFR: 64 mL/min/{1.73_m2} (ref >59)

## 2022-07-19 LAB — CBC WITH DIFFERENTIAL/PLATELET
Basophils Absolute: 0 10*3/uL (ref 0.0–0.2)
Basos: 1 %
EOS (ABSOLUTE): 0.4 10*3/uL (ref 0.0–0.4)
Eos: 5 %
Hematocrit: 38.7 % (ref 34.0–46.6)
Hemoglobin: 12 g/dL (ref 11.1–15.9)
Immature Grans (Abs): 0.1 10*3/uL (ref 0.0–0.1)
Immature Granulocytes: 1 %
Lymphocytes Absolute: 2.2 10*3/uL (ref 0.7–3.1)
Lymphs: 27 %
MCH: 26.5 pg — ABNORMAL LOW (ref 26.6–33.0)
MCHC: 31 g/dL — ABNORMAL LOW (ref 31.5–35.7)
MCV: 85 fL (ref 79–97)
Monocytes Absolute: 0.8 10*3/uL (ref 0.1–0.9)
Monocytes: 9 %
Neutrophils Absolute: 4.8 10*3/uL (ref 1.4–7.0)
Neutrophils: 57 %
Platelets: 357 10*3/uL (ref 150–450)
RBC: 4.53 x10E6/uL (ref 3.77–5.28)
RDW: 13.7 % (ref 11.7–15.4)
WBC: 8.2 10*3/uL (ref 3.4–10.8)

## 2022-07-19 NOTE — Progress Notes (Signed)
Contacted via MyChart   Good evening Sara Zhang, your labs have returned and overall are stable.  Great news.  No changes needed.  Any questions? Keep being amazing!!  Thank you for allowing me to participate in your care.  I appreciate you. Kindest regards, Hildred Pharo

## 2022-07-19 NOTE — Telephone Encounter (Signed)
Sara Zhang called back and verified correct fax and resubmitted fax while on the phone   Best contact: [phone]

## 2022-07-22 NOTE — Progress Notes (Signed)
Contacted via MyChart   Normal mammogram, may repeat in one year:)

## 2022-07-25 NOTE — Telephone Encounter (Signed)
Sara Zhang with [employer] is calling to f/u on the fax sent on 06/14 and resent today.  Please advise.

## 2022-07-26 NOTE — Telephone Encounter (Signed)
LVM for company stating that patient did not ask for this testing.

## 2022-07-31 DIAGNOSIS — R809 Proteinuria, unspecified: Secondary | ICD-10-CM | POA: Diagnosis not present

## 2022-07-31 DIAGNOSIS — E1159 Type 2 diabetes mellitus with other circulatory complications: Secondary | ICD-10-CM | POA: Diagnosis not present

## 2022-07-31 DIAGNOSIS — E785 Hyperlipidemia, unspecified: Secondary | ICD-10-CM | POA: Diagnosis not present

## 2022-07-31 DIAGNOSIS — E1169 Type 2 diabetes mellitus with other specified complication: Secondary | ICD-10-CM | POA: Diagnosis not present

## 2022-07-31 DIAGNOSIS — I152 Hypertension secondary to endocrine disorders: Secondary | ICD-10-CM | POA: Diagnosis not present

## 2022-07-31 DIAGNOSIS — E1129 Type 2 diabetes mellitus with other diabetic kidney complication: Secondary | ICD-10-CM | POA: Diagnosis not present

## 2022-07-31 DIAGNOSIS — Z794 Long term (current) use of insulin: Secondary | ICD-10-CM | POA: Diagnosis not present

## 2022-08-14 ENCOUNTER — Other Ambulatory Visit: Payer: Self-pay | Admitting: Internal Medicine

## 2022-08-14 ENCOUNTER — Other Ambulatory Visit: Payer: Self-pay | Admitting: Nurse Practitioner

## 2022-08-14 NOTE — Telephone Encounter (Signed)
Requested Prescriptions  Pending Prescriptions Disp Refills   atenolol (TENORMIN) 50 MG tablet [Pharmacy Med Name: ATENOLOL 50MG  TABLETS] 90 tablet 0    Sig: TAKE 1 TABLET(50 MG) BY MOUTH DAILY     Cardiovascular: Beta Blockers 2 Failed - 08/14/2022  7:31 AM      Failed - Cr in normal range and within 360 days    Creatinine  Date Value Ref Range Status  09/25/2013 0.75 0.60 - 1.30 mg/dL Final   Creatinine, Ser  Date Value Ref Range Status  07/18/2022 1.04 (H) 0.57 - 1.00 mg/dL Final         Passed - Last BP in normal range    BP Readings from Last 1 Encounters:  07/18/22 102/67         Passed - Last Heart Rate in normal range    Pulse Readings from Last 1 Encounters:  07/18/22 67         Passed - Valid encounter within last 6 months    Recent Outpatient Visits           3 weeks ago Nausea and vomiting in adult   [place] [facility] [place], Corrie Dandy T, NP   2 months ago Type 2 diabetes mellitus treated with insulin (HCC)   [place] [facility] [place], Sherwood T, NP   4 months ago Right lower quadrant abdominal pain   [place] [facility] [place], Keiser T, NP   5 months ago Subacute cough   [place] [facility] [place], [place] T, NP   6 months ago Acute cough   [place] [facility] [place], Dorie Rank, NP       Future Appointments             In 1 week [place], Dorie Rank, NP [place] [facility]

## 2022-08-18 NOTE — Patient Instructions (Signed)
Be Involved in Caring For Your Health:  Taking Medications When medications are taken as directed, they can greatly improve your health. But if they are not taken as prescribed, they may not work. In some cases, not taking them correctly can be harmful. To help ensure your treatment remains effective and safe, understand your medications and how to take them. Bring your medications to each visit for review by your provider.  Your lab results, notes, and after visit summary will be available on My Chart. We strongly encourage you to use this feature. If lab results are abnormal the clinic will contact you with the appropriate steps. If the clinic does not contact you assume the results are satisfactory. You can always view your results on My Chart. If you have questions regarding your health or results, please contact the clinic during office hours. You can also ask questions on My Chart.  We at [facility] are grateful that you chose us to provide your care. We strive to provide evidence-based and compassionate care and are always looking for feedback. If you get a survey from the clinic please complete this so we can hear your opinions.  Diabetes Mellitus Basics  Diabetes mellitus, or diabetes, is a long-term (chronic) disease. It occurs when the body does not properly use sugar (glucose) that is released from food after you eat. Diabetes mellitus may be caused by one or both of these problems: Your pancreas does not make enough of a hormone called insulin. Your body does not react in a normal way to the insulin that it makes. Insulin lets glucose enter cells in your body. This gives you energy. If you have diabetes, glucose cannot get into cells. This causes high blood glucose (hyperglycemia). How to treat and manage diabetes You may need to take insulin or other diabetes medicines daily to keep your glucose in balance. If you are prescribed insulin, you will learn how to give  yourself insulin by injection. You may need to adjust the amount of insulin you take based on the foods that you eat. You will need to check your blood glucose levels using a glucose monitor as told by your health care provider. The readings can help determine if you have low or high blood glucose. Generally, you should have these blood glucose levels: Before meals (preprandial): 80-130 mg/dL (4.4-7.2 mmol/L). After meals (postprandial): below 180 mg/dL (10 mmol/L). Hemoglobin A1c (HbA1c) level: less than 7%. Your health care provider will set treatment goals for you. Keep all follow-up visits. This is important. Follow these instructions at home: Diabetes medicines Take your diabetes medicines every day as told by your health care provider. List your diabetes medicines here: Name of medicine: ______________________________ Amount (dose): _______________ Time (a.m./p.m.): _______________ Notes: ___________________________________ Name of medicine: ______________________________ Amount (dose): _______________ Time (a.m./p.m.): _______________ Notes: ___________________________________ Name of medicine: ______________________________ Amount (dose): _______________ Time (a.m./p.m.): _______________ Notes: ___________________________________ Insulin If you use insulin, list the types of insulin you use here: Insulin type: ______________________________ Amount (dose): _______________ Time (a.m./p.m.): _______________Notes: ___________________________________ Insulin type: ______________________________ Amount (dose): _______________ Time (a.m./p.m.): _______________ Notes: ___________________________________ Insulin type: ______________________________ Amount (dose): _______________ Time (a.m./p.m.): _______________ Notes: ___________________________________ Insulin type: ______________________________ Amount (dose): _______________ Time (a.m./p.m.): _______________ Notes:  ___________________________________ Insulin type: ______________________________ Amount (dose): _______________ Time (a.m./p.m.): _______________ Notes: ___________________________________ Managing blood glucose  Check your blood glucose levels using a glucose monitor as told by your health care provider. Write down the times that you check your glucose levels here: Time: _______________ Notes: ___________________________________   Time: _______________ Notes: ___________________________________ Time: _______________ Notes: ___________________________________ Time: _______________ Notes: ___________________________________ Time: _______________ Notes: ___________________________________ Time: _______________ Notes: ___________________________________  Low blood glucose Low blood glucose (hypoglycemia) is when glucose is at or below 70 mg/dL (3.9 mmol/L). Symptoms may include: Feeling: Hungry. Sweaty and clammy. Irritable or easily upset. Dizzy. Sleepy. Having: A fast heartbeat. A headache. A change in your vision. Numbness around the mouth, lips, or tongue. Having trouble with: Moving (coordination). Sleeping. Treating low blood glucose To treat low blood glucose, eat or drink something containing sugar right away. If you can think clearly and swallow safely, follow the 15:15 rule: Take 15 grams of a fast-acting carb (carbohydrate), as told by your health care provider. Some fast-acting carbs are: Glucose tablets: take 3-4 tablets. Hard candy: eat 3-5 pieces. Fruit juice: drink 4 oz (120 mL). Regular (not diet) soda: drink 4-6 oz (120-180 mL). Honey or sugar: eat 1 Tbsp (15 mL). Check your blood glucose levels 15 minutes after you take the carb. If your glucose is still at or below 70 mg/dL (3.9 mmol/L), take 15 grams of a carb again. If your glucose does not go above 70 mg/dL (3.9 mmol/L) after 3 tries, get help right away. After your glucose goes back to normal, eat a meal  or a snack within 1 hour. Treating very low blood glucose If your glucose is at or below 54 mg/dL (3 mmol/L), you have very low blood glucose (severe hypoglycemia). This is an emergency. Do not wait to see if the symptoms will go away. Get medical help right away. Call your local emergency services (911 in the [place] Do not drive yourself to the hospital. Questions to ask your health care provider Should I talk with a diabetes educator? What equipment will I need to care for myself at home? What diabetes medicines do I need? When should I take them? How often do I need to check my blood glucose levels? What number can I call if I have questions? When is my follow-up visit? Where can I find a support group for people with diabetes? Where to find more information American Diabetes Association: www.diabetes.org Association of Diabetes Care and Education Specialists: www.diabeteseducator.org Contact a health care provider if: Your blood glucose is at or above 240 mg/dL (13.3 mmol/L) for 2 days in a row. You have been sick or have had a fever for 2 days or more, and you are not getting better. You have any of these problems for more than 6 hours: You cannot eat or drink. You feel nauseous. You vomit. You have diarrhea. Get help right away if: Your blood glucose is lower than 54 mg/dL (3 mmol/L). You get confused. You have trouble thinking clearly. You have trouble breathing. These symptoms may represent a serious problem that is an emergency. Do not wait to see if the symptoms will go away. Get medical help right away. Call your local emergency services (911 in the [place] Do not drive yourself to the hospital. Summary Diabetes mellitus is a chronic disease that occurs when the body does not properly use sugar (glucose) that is released from food after you eat. Take insulin and diabetes medicines as told. Check your blood glucose every day, as often as told. Keep all follow-up visits. This  is important. This information is not intended to replace advice given to you by your health care provider. Make sure you discuss any questions you have with your health care provider. Document Revised: 05/25/2019 Document Reviewed: 05/25/2019 Elsevier Patient Education    2024 Elsevier Inc.  

## 2022-08-21 ENCOUNTER — Ambulatory Visit (INDEPENDENT_AMBULATORY_CARE_PROVIDER_SITE_OTHER): Payer: Medicare Other | Admitting: Nurse Practitioner

## 2022-08-21 ENCOUNTER — Encounter: Payer: Self-pay | Admitting: Nurse Practitioner

## 2022-08-21 VITALS — BP 127/69 | HR 70 | Temp 98.6°F | Ht 69.02 in | Wt 348.2 lb

## 2022-08-21 DIAGNOSIS — I130 Hypertensive heart and chronic kidney disease with heart failure and stage 1 through stage 4 chronic kidney disease, or unspecified chronic kidney disease: Secondary | ICD-10-CM | POA: Diagnosis not present

## 2022-08-21 DIAGNOSIS — E1129 Type 2 diabetes mellitus with other diabetic kidney complication: Secondary | ICD-10-CM

## 2022-08-21 DIAGNOSIS — I428 Other cardiomyopathies: Secondary | ICD-10-CM | POA: Diagnosis not present

## 2022-08-21 DIAGNOSIS — E1169 Type 2 diabetes mellitus with other specified complication: Secondary | ICD-10-CM

## 2022-08-21 DIAGNOSIS — Z6841 Body Mass Index (BMI) 40.0 and over, adult: Secondary | ICD-10-CM | POA: Diagnosis not present

## 2022-08-21 DIAGNOSIS — J45991 Cough variant asthma: Secondary | ICD-10-CM | POA: Diagnosis not present

## 2022-08-21 DIAGNOSIS — E785 Hyperlipidemia, unspecified: Secondary | ICD-10-CM | POA: Diagnosis not present

## 2022-08-21 DIAGNOSIS — E119 Type 2 diabetes mellitus without complications: Secondary | ICD-10-CM

## 2022-08-21 DIAGNOSIS — G4733 Obstructive sleep apnea (adult) (pediatric): Secondary | ICD-10-CM

## 2022-08-21 DIAGNOSIS — N1831 Chronic kidney disease, stage 3a: Secondary | ICD-10-CM | POA: Diagnosis not present

## 2022-08-21 DIAGNOSIS — I5032 Chronic diastolic (congestive) heart failure: Secondary | ICD-10-CM

## 2022-08-21 DIAGNOSIS — R809 Proteinuria, unspecified: Secondary | ICD-10-CM

## 2022-08-21 DIAGNOSIS — Z794 Long term (current) use of insulin: Secondary | ICD-10-CM

## 2022-08-21 LAB — BAYER DCA HB A1C WAIVED: HB A1C (BAYER DCA - WAIVED): 7.7 % — ABNORMAL HIGH (ref 4.8–5.6)

## 2022-08-21 NOTE — Assessment & Plan Note (Signed)
Recheck on labs today and recommend intake of calcium rich foods + calcium supplement daily.

## 2022-08-21 NOTE — Assessment & Plan Note (Signed)
Chronic, ongoing with BP at goal.  Euvolemic today.  Continue current medication regimen and collaboration with cardiology.  Recommend continued focus on diet and salt reduction.  Recommend: - Reminded to call for an overnight weight gain of >2 pounds or a weekly weight weight of >5 pounds - not adding salt to his food and has been reading food labels. Reviewed the importance of keeping daily sodium intake to <2000mg daily -- NO PORK SKINS - Avoid Ibuprofen 

## 2022-08-21 NOTE — Assessment & Plan Note (Signed)
Chronic, stable.  BP well below goal today.  Continue current medication regimen and collaboration with cardiology.  Recommend checking BP three mornings a week at home + documenting for providers. Education and recommendation provided today on DASH diet focus and avoidance of high sodium foods, and cutting back on pork skins, which patient states is difficult to do.  DASH diet focus.  LABS: CMP.  Return in 3 months.

## 2022-08-21 NOTE — Assessment & Plan Note (Signed)
Chronic, stable at this time without inhalers. Continue O2 2L at night.

## 2022-08-21 NOTE — Assessment & Plan Note (Signed)
Chronic, ongoing.  Continue collaboration with cardiology and current medication regimen as prescribed by them.  Recent notes reviewed.

## 2022-08-21 NOTE — Assessment & Plan Note (Signed)
Chronic, ongoing.  Continue to monitor closely.  Maintain ARB on board for protection.  Avoid Ibuprofen products.  Refer to nephrology as needed and renal dose medications as needed.  Educated patient on good hydration at home.

## 2022-08-21 NOTE — Assessment & Plan Note (Signed)
 Refer to morbid obesity plan of care. 

## 2022-08-21 NOTE — Assessment & Plan Note (Addendum)
Chronic, ongoing.  Followed by endo.  A1c today 7.7%, trend up, however endo just started her on Mounjaro and stopped Trulicity. Urine ALB  13 February 2022. Continue collaboration with endocrinology, has next visit 11/04/22.   - Continue current medication regimen as ordered by them, including Mounjaro which was just started.  Ensure to attend visits with endo consistently.   - Recommend she continue to monitor BS at home 2-3 times a day.   - Ensure healthy diet choices at home, reduce candy. - Foot and eye exams up to date. - Vaccinations -- refuses - ARB and statin on board

## 2022-08-21 NOTE — Assessment & Plan Note (Signed)
Chronic, ongoing.  Continue current use of O2 at night and CPAP. She has no concerns today.

## 2022-08-21 NOTE — Progress Notes (Signed)
BP 127/69   Pulse 70   Temp 98.6 F (37 C) (Oral)   Ht 5' 9.02" (1.753 m)   Wt (!) 348 lb 3.2 oz (157.9 kg)   SpO2 92%   BMI 51.40 kg/m    Subjective:    Patient ID: Sara Zhang, female    DOB: 1970/07/12, 52 y.o.   MRN: [id number]  HPI: Sara Zhang is a 52 y.o. female  Chief Complaint  Patient presents with   Diabetes   Hypertension   Hyperlipidemia   Congestive Heart Failure   Asthma   DIABETES In April A1c 7.4%.  Current medications include Jardiance 25 MG, Basaglar 52 units QHS, and Mounjaro 2.5 MG (she just started this, is on third dose and tolerating -- endo started this).  Last saw endocrinology 07/31/22 (Dr. Huntley Dec), with no changes made.  Endorses poor diet recently, eating things she should not -- candy.  Continues on Vitamin D and B12 supplements daily. Polydipsia/polyuria: no Visual disturbance: no Chest pain: no Paresthesias: no Glucose Monitoring: yes             Accucheck frequency: BID             Fasting glucose: 120 to 130 range             Evening: <200 range, occasional >200 -- sometimes does not take night time insulin if sugars are lower             Before meals: Taking Insulin?: yes             Long acting insulin: 52 units             Short acting insulin: Blood Pressure Monitoring: not checking Retinal Examination: Up to Date -- [facility], Dr. Inez Pilgrim Foot Exam: Up to Date Pneumovax: Up to Date Influenza: Not Up to Date -- refuses Aspirin: no    HYPERTENSION / HYPERLIPIDEMIA/HF Current medications include Losartan (no Lisinopril due to cough), Furosemide, Amlodipine, Atenolol for HTN + Lipitor for HLD.     Last visit with cardiology 03/29/22 -- to return in one year.  Last echo in 2020 October was 55-60%. Satisfied with current treatment? yes Duration of hypertension: chronic BP monitoring frequency: not checking BP range:  BP medication side effects: no Duration of hyperlipidemia: chronic Cholesterol  medication side effects: no Cholesterol supplements: none Medication compliance: good compliance Aspirin: no Recent stressors: no Recurrent headaches: no Visual changes: no Palpitations: no Dyspnea: no Chest pain: no Lower extremity edema: no Dizzy/lightheaded: no The ASCVD Risk score (Arnett DK, et al., 2019) failed to calculate for the following reasons:   The valid total cholesterol range is 130 to 320 mg/dL  CHRONIC KIDNEY DISEASE Improved on last check. CKD status: stable Medications renally dose: yes Previous renal evaluation: no Pneumovax:  Up to Date Influenza Vaccine:  Not up to Date    ASTHMA Last saw Dr. Sherene Sires with pulmonary 11/28/20.  Continues to use O2 every night due to OSA and CPAP -- last saw sleep provider on 03/25/22.  No current inhaler regimen. Asthma status: stable Satisfied with current treatment?: yes Albuterol/rescue inhaler frequency: none Dyspnea frequency: no Wheezing frequency: no Cough frequency: no Nocturnal symptom frequency: no Limitation of activity: no Current upper respiratory symptoms: no Triggers: enviromental Aerochamber/spacer use: no Visits to ER or Urgent Care in past year: no Pneumovax: Up to Date Influenza:  refuses     Relevant past medical, surgical, family and social history reviewed and updated as  indicated. Interim medical history since our last visit reviewed. Allergies and medications reviewed and updated.  Review of Systems  Constitutional:  Negative for activity change, appetite change, diaphoresis, fatigue and fever.  Respiratory:  Negative for cough, chest tightness, shortness of breath and wheezing.   Cardiovascular:  Negative for chest pain, palpitations and leg swelling.  Gastrointestinal: Negative.   Endocrine: Negative for cold intolerance, heat intolerance, polydipsia, polyphagia and polyuria.  Neurological: Negative.   Psychiatric/Behavioral: Negative.      Per HPI unless specifically indicated above      Objective:    BP 127/69   Pulse 70   Temp 98.6 F (37 C) (Oral)   Ht 5' 9.02" (1.753 m)   Wt (!) 348 lb 3.2 oz (157.9 kg)   SpO2 92%   BMI 51.40 kg/m   Wt Readings from Last 3 Encounters:  08/21/22 (!) 348 lb 3.2 oz (157.9 kg)  07/18/22 (!) 346 lb 12.8 oz (157.3 kg)  06/29/22 (!) 352 lb 11.8 oz (160 kg)    Physical Exam Vitals and nursing note reviewed.  Constitutional:      General: She is awake. She is not in acute distress.    Appearance: She is well-developed and well-groomed. She is morbidly obese. She is not ill-appearing or toxic-appearing.  HENT:     Head: Normocephalic.     Right Ear: Hearing and external ear normal.     Left Ear: Hearing and external ear normal.  Eyes:     General: Lids are normal. No scleral icterus.       Right eye: No discharge.        Left eye: No discharge.     Conjunctiva/sclera: Conjunctivae normal.     Pupils: Pupils are equal, round, and reactive to light.  Neck:     Thyroid: No thyromegaly.     Vascular: No carotid bruit.  Cardiovascular:     Rate and Rhythm: Normal rate and regular rhythm.     Heart sounds: Normal heart sounds. No murmur heard.    No gallop.  Pulmonary:     Effort: Pulmonary effort is normal. No accessory muscle usage or respiratory distress.     Breath sounds: Normal breath sounds. No decreased breath sounds, wheezing or rhonchi.  Abdominal:     General: Abdomen is protuberant. Bowel sounds are normal. There is no distension.     Palpations: Abdomen is soft.     Tenderness: There is no abdominal tenderness.  Musculoskeletal:     Cervical back: Normal range of motion and neck supple.     Right lower leg: Edema (trace) present.     Left lower leg: Edema (trace) present.  Lymphadenopathy:     Cervical: No cervical adenopathy.  Skin:    General: Skin is warm and dry.     Capillary Refill: Capillary refill takes less than 2 seconds.  Neurological:     Mental Status: She is alert and oriented to person, place,  and time.  Psychiatric:        Attention and Perception: Attention normal.        Mood and Affect: Mood and affect normal.        Speech: Speech normal.        Behavior: Behavior normal. Behavior is cooperative.        Thought Content: Thought content normal.     Results for orders placed or performed in visit on 07/18/22  CBC with Differential/Platelet  Result Value Ref Range   WBC  8.2 3.4 - 10.8 x10E3/uL   RBC 4.53 3.77 - 5.28 x10E6/uL   Hemoglobin 12.0 11.1 - 15.9 g/dL   Hematocrit 62.6 94.8 - 46.6 %   MCV 85 79 - 97 fL   MCH 26.5 (L) 26.6 - 33.0 pg   MCHC 31.0 (L) 31.5 - 35.7 g/dL   RDW 54.6 27.0 - 35.0 %   Platelets 357 150 - 450 x10E3/uL   Neutrophils 57 Not Estab. %   Lymphs 27 Not Estab. %   Monocytes 9 Not Estab. %   Eos 5 Not Estab. %   Basos 1 Not Estab. %   Neutrophils Absolute 4.8 1.4 - 7.0 x10E3/uL   Lymphocytes Absolute 2.2 0.7 - 3.1 x10E3/uL   Monocytes Absolute 0.8 0.1 - 0.9 x10E3/uL   EOS (ABSOLUTE) 0.4 0.0 - 0.4 x10E3/uL   Basophils Absolute 0.0 0.0 - 0.2 x10E3/uL   Immature Granulocytes 1 Not Estab. %   Immature Grans (Abs) 0.1 0.0 - 0.1 x10E3/uL  Comprehensive metabolic panel  Result Value Ref Range   Glucose 115 (H) 70 - 99 mg/dL   BUN 19 6 - 24 mg/dL   Creatinine, Ser 0.93 (H) 0.57 - 1.00 mg/dL   eGFR 64 >81 WE/XHB/7.16   BUN/Creatinine Ratio 18 9 - 23   Sodium 140 134 - 144 mmol/L   Potassium 4.0 3.5 - 5.2 mmol/L   Chloride 101 96 - 106 mmol/L   CO2 24 20 - 29 mmol/L   Calcium 8.7 8.7 - 10.2 mg/dL   Total Protein 6.8 6.0 - 8.5 g/dL   Albumin 3.6 (L) 3.8 - 4.9 g/dL   Globulin, Total 3.2 1.5 - 4.5 g/dL   Albumin/Globulin Ratio 1.1    Bilirubin Total 0.3 0.0 - 1.2 mg/dL   Alkaline Phosphatase 109 44 - 121 IU/L   AST 17 0 - 40 IU/L   ALT 14 0 - 32 IU/L      Assessment & Plan:   Problem List Items Addressed This Visit       Cardiovascular and Mediastinum   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  BP well below goal today.  Continue current medication regimen and collaboration with cardiology.  Recommend checking BP three mornings a week at home + documenting for providers. Education and recommendation provided today on DASH diet focus and avoidance of high sodium foods, and cutting back on pork skins, which patient states is difficult to do.  DASH diet focus.  LABS: CMP.  Return in 3 months.      Chronic heart failure with preserved ejection fraction (HCC)    Chronic, ongoing with BP at goal.  Euvolemic today.  Continue current medication regimen and collaboration with cardiology.  Recommend continued focus on diet and salt reduction.  Recommend: - Reminded to call for an overnight weight gain of >2 pounds or a weekly weight weight of >5 pounds - not adding salt to his food and has been reading food labels. Reviewed the importance of keeping daily sodium intake to 2000mg  daily -- NO PORK SKINS - Avoid Ibuprofen      NICM (nonischemic cardiomyopathy) (HCC)    Chronic, ongoing.  Continue collaboration with cardiology and current medication regimen as prescribed by them.  Recent notes reviewed.        Respiratory   Cough variant asthma vs Upper Airway cough syndrome    Chronic, stable at this time without inhalers. Continue O2  2L at night.        OSA on CPAP    Chronic, ongoing.  Continue current use of O2 at night and CPAP. She has no concerns today.        Endocrine   Hyperlipidemia associated with type 2 diabetes mellitus (HCC)    Chronic, ongoing.  Continue current medication regimen as is at goal.  Lipid panel today.      Relevant Medications   MOUNJARO 2.5 MG/0.5ML Pen   tirzepatide Greggory Keen) 5 MG/0.5ML Pen (Start on 09/25/2022)   Other Relevant Orders   Bayer DCA Hb A1c Waived   Comprehensive metabolic panel   Lipid Panel w/o Chol/HDL Ratio   Proteinuria due to type 2 diabetes mellitus (HCC)    Chronic, ongoing.  Followed by endo.   A1c today 7.7%, trend up, however endo just started her on Mounjaro and stopped Trulicity. Urine ALB  13 February 2022. Continue collaboration with endocrinology, has next visit 11/04/22.   - Continue current medication regimen as ordered by them, including Mounjaro which was just started.  Ensure to attend visits with endo consistently.   - Recommend she continue to monitor BS at home 2-3 times a day.   - Ensure healthy diet choices at home, reduce candy. - Foot and eye exams up to date. - Vaccinations -- refuses - ARB and statin on board      Relevant Medications   MOUNJARO 2.5 MG/0.5ML Pen   tirzepatide [facility]) 5 MG/0.5ML Pen (Start on 09/25/2022)   Other Relevant Orders   Bayer DCA Hb A1c Waived   Comprehensive metabolic panel   Type 2 diabetes mellitus treated with insulin (HCC) - Primary    Chronic, ongoing.  Followed by endo.  A1c today 7.7%, trend up, however endo just started her on Mounjaro and stopped Trulicity. Urine ALB  13 February 2022. Continue collaboration with endocrinology, has next visit 11/04/22.   - Continue current medication regimen as ordered by them, including Mounjaro which was just started.  Ensure to attend visits with endo consistently.   - Recommend she continue to monitor BS at home 2-3 times a day.   - Ensure healthy diet choices at home, reduce candy. - Foot and eye exams up to date. - Vaccinations -- refuses - ARB and statin on board      Relevant Medications   MOUNJARO 2.5 MG/0.5ML Pen   tirzepatide [facility]) 5 MG/0.5ML Pen (Start on 09/25/2022)   Other Relevant Orders   Bayer DCA Hb A1c Waived   Comprehensive metabolic panel     Genitourinary   CKD (chronic kidney disease) stage 3, GFR 30-59 ml/min (HCC)    Chronic, ongoing.  Continue to monitor closely.  Maintain ARB on board for protection.  Avoid Ibuprofen products.  Refer to nephrology as needed and renal dose medications as needed.  Educated patient on good hydration at home.         Other   BMI 50.0-59.9, adult [facility])    Refer to morbid obesity plan of care.      Relevant Medications   MOUNJARO 2.5 MG/0.5ML Pen   tirzepatide Greggory Keen) 5 MG/0.5ML Pen (Start on 09/25/2022)   Hypocalcemia    Recheck on labs today and recommend intake of calcium rich foods + calcium supplement daily.        Morbid obesity (HCC)    BMI 51.40 with diabetes, HTN, HF.  Recommended eating smaller high protein, low fat meals more frequently and exercising 30 mins a day 5 times  a week with a goal of 10-15lb weight loss in the next 3 months. Patient voiced their understanding and motivation to adhere to these recommendations.       Relevant Medications   MOUNJARO 2.5 MG/0.5ML Pen   tirzepatide (MOUNJARO) 5 MG/0.5ML Pen (Start on 09/25/2022)     Follow up plan: Return in about 3 months (around 11/21/2022) for T2DM, HTN/HLD, HF, ASTHMA, GERD -- with pap + needs Medicare wellness with nurse after 09/25/22.

## 2022-08-21 NOTE — Assessment & Plan Note (Signed)
Chronic, ongoing.  Continue current medication regimen as is at goal.  Lipid panel today. 

## 2022-08-21 NOTE — Assessment & Plan Note (Signed)
BMI 51.40 with diabetes, HTN, HF.  Recommended eating smaller high protein, low fat meals more frequently and exercising 30 mins a day 5 times a week with a goal of 10-15lb weight loss in the next 3 months. Patient voiced their understanding and motivation to adhere to these recommendations.

## 2022-08-22 LAB — COMPREHENSIVE METABOLIC PANEL
ALT: 15 IU/L (ref 0–32)
AST: 11 IU/L (ref 0–40)
Albumin: 3.8 g/dL (ref 3.8–4.9)
Alkaline Phosphatase: 133 IU/L — ABNORMAL HIGH (ref 44–121)
BUN/Creatinine Ratio: 19 (ref 9–23)
BUN: 23 mg/dL (ref 6–24)
Bilirubin Total: 0.4 mg/dL (ref 0.0–1.2)
CO2: 24 mmol/L (ref 20–29)
Calcium: 8.1 mg/dL — ABNORMAL LOW (ref 8.7–10.2)
Chloride: 102 mmol/L (ref 96–106)
Creatinine, Ser: 1.18 mg/dL — ABNORMAL HIGH (ref 0.57–1.00)
Globulin, Total: 3.2 g/dL (ref 1.5–4.5)
Glucose: 167 mg/dL — ABNORMAL HIGH (ref 70–99)
Potassium: 4.4 mmol/L (ref 3.5–5.2)
Sodium: 141 mmol/L (ref 134–144)
Total Protein: 7 g/dL (ref 6.0–8.5)
eGFR: 55 mL/min/{1.73_m2} — ABNORMAL LOW (ref >59)

## 2022-08-22 LAB — LIPID PANEL W/O CHOL/HDL RATIO
Cholesterol, Total: 111 mg/dL (ref 100–199)
HDL: 42 mg/dL (ref >39)
LDL Chol Calc (NIH): 54 mg/dL (ref 0–99)
Triglycerides: 69 mg/dL (ref 0–149)
VLDL Cholesterol Cal: 15 mg/dL (ref 5–40)

## 2022-08-22 NOTE — Progress Notes (Signed)
Contacted via MyChart   Good morning Kimmberly, your labs have returned: - Kidney function, creatinine and eGFR, show some mild reduction this visit (mild kidney disease).  Please ensure to avoid Ibuprofen and watch your diet closely.   - Remainder of labs stable, no changes needed.  Any questions? Keep being amazing!!  Thank you for allowing me to participate in your care.  I appreciate you. Kindest regards, Whitt Auletta

## 2022-09-02 ENCOUNTER — Encounter: Payer: Self-pay | Admitting: Pharmacist

## 2022-09-02 NOTE — Progress Notes (Signed)
Patient previously followed by [facility] pharmacist. Per clinical review, no pharmacist appointment needed at this time.

## 2022-10-01 ENCOUNTER — Ambulatory Visit (INDEPENDENT_AMBULATORY_CARE_PROVIDER_SITE_OTHER): Payer: Medicare Other | Admitting: Emergency Medicine

## 2022-10-01 VITALS — BP 130/68 | Ht 69.0 in | Wt 363.2 lb

## 2022-10-01 DIAGNOSIS — Z0000 Encounter for general adult medical examination without abnormal findings: Secondary | ICD-10-CM

## 2022-10-01 NOTE — Patient Instructions (Addendum)
Ms. Course , Thank you for taking time to come for your Medicare Wellness Visit. I appreciate your ongoing commitment to your health goals. Please review the following plan we discussed and let me know if I can assist you in the future.   Referrals/Orders/Follow-Ups/Clinician Recommendations: Get a tetanus shot at your next office visit. Get papsmear.  This is a list of the screening recommended for you and due dates:  Health Maintenance  Topic Date Due   Zoster (Shingles) Vaccine (1 of 2) Never done   COVID-19 Vaccine (3 - Pfizer risk series) 11/03/2019   Pap Smear  01/20/2022   Flu Shot  09/05/2022   Eye exam for diabetics  12/12/2022   Complete foot exam   01/22/2023   Hemoglobin A1C  02/21/2023   Cologuard (Stool DNA test)  03/27/2023   Yearly kidney health urinalysis for diabetes  05/22/2023   Mammogram  07/19/2023   Yearly kidney function blood test for diabetes  08/21/2023   Medicare Annual Wellness Visit  10/01/2023   Hepatitis C Screening  Completed   HIV Screening  Completed   HPV Vaccine  Aged Out   DTaP/Tdap/Td vaccine  Discontinued    Advanced directives: (ACP Link)Information on Advanced Care Planning can be found at [facility] of [place] Advance Health Care Directives Advance Health Care Directives ([email or web address]   Next Medicare Annual Wellness Visit scheduled for next year: Yes, 10/07/23 @ 8:15am

## 2022-10-01 NOTE — Progress Notes (Signed)
Subjective:   Sara Zhang is a 52 y.o. female who presents for Medicare Annual (Subsequent) preventive examination.  Visit Complete: In office  Provider Location: Office/Clinic    Review of Systems     Cardiac Risk Factors include: diabetes mellitus;dyslipidemia;hypertension;obesity (BMI >30kg/m2) (OSA (cpap))     Objective:    Today's Vitals   10/01/22 0811  BP: 130/68  Weight: (!) 363 lb 3.2 oz (164.7 kg)  Height: 5\' 9"  (1.753 m)   Body mass index is 53.64 kg/m.     10/01/2022    8:26 AM 04/29/2022    5:00 PM 09/24/2021    8:38 AM 09/22/2020    8:15 AM 02/04/2020    8:40 PM 09/20/2019    8:16 AM 02/03/2019    6:37 AM  Advanced Directives  Does Patient Have a Medical Advance Directive? No No No No No No   Would patient like information on creating a medical advance directive? Yes (MAU/Ambulatory/Procedural Areas - Information given) No - Patient declined No - Patient declined  No - Patient declined  No - Patient declined    Current Medications (verified) Outpatient Encounter Medications as of 10/01/2022  Medication Sig   albuterol (VENTOLIN HFA) 108 (90 Base) MCG/ACT inhaler INHALE 2 PUFFS INTO THE LUNGS EVERY 6 HOURS AS NEEDED FOR WHEEZING OR SHORTNESS OF BREATH   amLODipine (NORVASC) 5 MG tablet TAKE 1 TABLET(5 MG) BY MOUTH DAILY   atenolol (TENORMIN) 50 MG tablet TAKE 1 TABLET(50 MG) BY MOUTH DAILY   atorvastatin (LIPITOR) 10 MG tablet TAKE 1 TABLET(10 MG) BY MOUTH DAILY   Blood Glucose Monitoring Suppl (CONTOUR NEXT MONITOR) w/Device KIT To check blood sugar 4 times daily.   Calcium Carbonate-Vitamin D (OYSTER SHELL CALCIUM 500 + D) 500-125 MG-UNIT TABS Take 1 tablet by mouth daily.    Cholecalciferol (VITAMIN D3) 10 MCG (400 UNIT) tablet Take 400 Units by mouth daily.   ferrous sulfate 325 (65 FE) MG tablet Take 325 mg by mouth daily with breakfast.   furosemide (LASIX) 40 MG tablet TAKE 1 TABLET(40 MG) BY MOUTH TWICE DAILY   glucose blood (CONTOUR NEXT  TEST) test strip TEST BLOOD SUGAR ONCE DAILY   Insulin Glargine (BASAGLAR KWIKPEN) 100 UNIT/ML Inject 52 Units into the skin at bedtime.   Insulin Pen Needle 32G X 4 MM MISC 1 Units by Does not apply route every morning. Pen needles   JARDIANCE 25 MG TABS tablet TAKE 1 TABLET(25 MG) BY MOUTH DAILY   Lancets (ONETOUCH ULTRASOFT) lancets Use as instructed   levonorgestrel (MIRENA) 20 MCG/24HR IUD 1 each by Intrauterine route once.    losartan (COZAAR) 25 MG tablet TAKE 1 TABLET(25 MG) BY MOUTH DAILY   MOUNJARO 2.5 MG/0.5ML Pen Inject 2.5 mg into the skin once a week.   OXYGEN Inhale 2 L into the lungs daily.   metoCLOPramide (REGLAN) 10 MG tablet Take 1 tablet (10 mg total) by mouth 3 (three) times daily with meals. (Patient not taking: Reported on 10/01/2022)   tirzepatide [facility]) 5 MG/0.5ML Pen Inject 5 mg into the skin once a week. (Patient not taking: Reported on 10/01/2022)   No facility-administered encounter medications on file as of 10/01/2022.    Allergies (verified) Patient has no known allergies.   History: Past Medical History:  Diagnosis Date   Abnormal uterine bleeding (AUB) 11/25/2016   Chronic diastolic CHF (congestive heart failure) (HCC)    a. 02/2012 Echo: EF 25-35%, glob HK; b. 06/2016 Echo: EF 55-60%, mild MR, nl  PASP.   CKD (chronic kidney disease), stage II    COVID-19    Diabetes mellitus without complication (HCC)    Extreme obesity    Hyperlipidemia    Hypertension    Hypoxia    Microcytic anemia    Pneumonia    Pneumonia    Sleep apnea    Status post endometrial ablation 06/20/2016   2015 hysteroscopy/D&C with NovaSure endometrial ablation; pathology-benign endometrial polyps   Thickened endometrium 06/20/2016   Ultrasound-22.3 mm endometrial stripe, heterogenous   Vaginal bleeding    a. 01/2017 s/p hyteroscopy and polypectomy [facility]).   Vitamin D deficiency    Past Surgical History:  Procedure Laterality Date   CESAREAN SECTION     CHOLECYSTECTOMY      DILATION AND CURETTAGE, DIAGNOSTIC / THERAPEUTIC     ERCP N/A 06/27/2022   Procedure: ENDOSCOPIC RETROGRADE CHOLANGIOPANCREATOGRAPHY (ERCP);  Surgeon: Midge Minium, MD;  [facility] END[facility]COPY;  Service: Endoscopy;  Laterality: N/A;   TONSILLECTOMY AND ADENOIDECTOMY     TOOTH EXTRACTION  01/2016   Family History  Problem Relation Age of Onset   Diabetes Mother    Hyperlipidemia Mother    Hypertension Mother    Stroke Mother    Arthritis Father    Asthma Father    Diabetes Father    Hypertension Father    Hypertension Brother    Diabetes Brother    Diabetes Maternal Grandmother    Hypertension Maternal Grandmother    Diabetes Maternal Grandfather    Hypertension Maternal Grandfather    Diabetes Paternal Grandmother    Hypertension Paternal Grandmother    Heart attack Maternal Uncle    Breast cancer Neg Hx    Social History   Socioeconomic History   Marital status: Single    Spouse name: Not on file   Number of children: 1   Years of education: GED   Highest education level: GED or equivalent  Occupational History   Occupation: disability  Tobacco Use   Smoking status: Never   Smokeless tobacco: Never  Vaping Use   Vaping status: Never Used  Substance and Sexual Activity   Alcohol use: Yes    Alcohol/week: 0.0 standard drinks of alcohol    Comment: 1-2 beers/month   Drug use: No   Sexual activity: Yes    Birth control/protection: I.U.D.  Other Topics Concern   Not on file  Social History Narrative   Never married, 1 son.  The patient recently lost an aunt in December 2020 and feels she got C19 related to the amount of family and friends coming in and out to pay respects to her aunt.    Social Determinants of Health   Financial Resource Strain: Low Risk  (10/01/2022)   Overall Financial Resource Strain (CARDIA)    Difficulty of Paying Living Expenses: Not very hard  Food Insecurity: No Food Insecurity (10/01/2022)   Hunger Vital Sign    Worried About  Running Out of Food in the Last Year: Never true    Ran Out of Food in the Last Year: Never true  Transportation Needs: No Transportation Needs (10/01/2022)   PRAPARE - Administrator, Civil Service (Medical): No    Lack of Transportation (Non-Medical): No  Physical Activity: Inactive (10/01/2022)   Exercise Vital Sign    Days of Exercise per Week: 0 days    Minutes of Exercise per Session: 0 min  Stress: No Stress Concern Present (10/01/2022)   [employer] of Occupational Health - Occupational  Stress Questionnaire    Feeling of Stress : Not at all  Social Connections: Moderately Isolated (10/01/2022)   Social Connection and Isolation Panel [NHANES]    Frequency of Communication with Friends and Family: More than three times a week    Frequency of Social Gatherings with Friends and Family: Twice a week    Attends Religious Services: More than 4 times per year    Active Member of [employer] or Organizations: No    Attends Engineer, structural: Never    Marital Status: Never married    Tobacco Counseling Counseling given: Not Answered   Clinical Intake:  Pre-visit preparation completed: Yes  Pain : No/denies pain     BMI - recorded: 53.64 Nutritional Status: BMI > 30  Obese Nutritional Risks: None Diabetes: Yes CBG done?: No (FBS 110 per patient) CBG resulted in Enter/ Edit results?: No Did pt. bring in CBG monitor from home?: No  How often do you need to have someone help you when you read instructions, pamphlets, or other written materials from your doctor or pharmacy?: 1 - Never  Interpreter Needed?: No  Information entered by :: Tora Kindred, CMA   Activities of Daily Living    10/01/2022    8:16 AM 04/29/2022    5:00 PM  In your present state of health, do you have any difficulty performing the following activities:  Hearing? 0 0  Vision? 0 0  Difficulty concentrating or making decisions? 0 0  Walking or climbing stairs? 0 0  Dressing or  bathing? 0 0  Doing errands, shopping? 0 0  Preparing Food and eating ? N   Using the Toilet? N   In the past six months, have you accidently leaked urine? N   Do you have problems with loss of bowel control? N   Managing your Medications? N   Managing your Finances? N   Housekeeping or managing your Housekeeping? N     Patient Care Team: Marjie Skiff, NP as PCP - General (Nurse Practitioner) End, Cristal Deer, MD as PCP - Cardiology (Cardiology) Linus Galas, DPM (Podiatry) Defrancesco, Prentice Docker, MD as Consulting Physician (Obstetrics and Gynecology) Lockie Mola, MD as Referring Physician (Ophthalmology) Zettie Pho, [facility] (Inactive) (Pharmacist) Langston Reusing, PA-C (Endocrinology) Midge Minium, MD as Consulting Physician (Gastroenterology)  Indicate any recent Medical Services you may have received from other than [facility] providers in the past year (date may be approximate).     Assessment:   This is a routine wellness examination for Baelyn.  Hearing/Vision screen Hearing Screening - Comments:: Denies hearing loss Vision Screening - Comments:: Gets routine eye exams  Dietary issues and exercise activities discussed:     Goals Addressed               This Visit's Progress     Patient Stated (pt-stated)        Lose 50 lbs. Eat better      Depression Screen    10/01/2022    8:23 AM 08/21/2022    8:37 AM 07/18/2022   11:17 AM 04/11/2022    9:17 AM 02/19/2022   11:06 AM 02/12/2022    9:59 AM 01/21/2022    8:12 AM  PHQ 2/9 Scores  PHQ - 2 Score 0 0 0 0 1 0 0  PHQ- 9 Score 0 1 2 1 2 3 1     Fall Risk    10/01/2022    8:27 AM 08/21/2022    8:37 AM 07/18/2022  11:17 AM 04/11/2022    9:16 AM 02/19/2022   11:05 AM  Fall Risk   Falls in the past year? 0 0 0 0 0  Number falls in past yr: 0 0 0 0 0  Injury with Fall? 0 0 0 0 0  Risk for fall due to : No Fall Risks No Fall Risks No Fall Risks No Fall Risks No Fall Risks  Follow up Falls  prevention discussed Falls evaluation completed Falls evaluation completed Falls evaluation completed Falls evaluation completed    MEDICARE RISK AT HOME: Medicare Risk at Home Any stairs in or around the home?: Yes If so, are there any without handrails?: No Home free of loose throw rugs in walkways, pet beds, electrical cords, etc?: Yes Adequate lighting in your home to reduce risk of falls?: Yes Life alert?: No Use of a cane, walker or w/c?: No Grab bars in the bathroom?: No Shower chair or bench in shower?: No Elevated toilet seat or a handicapped toilet?: No  TIMED UP AND GO:  Was the test performed?  Yes  Length of time to ambulate 10 feet: 10 sec sec Gait steady and fast without use of assistive device    Cognitive Function:        10/01/2022    8:28 AM 09/24/2021    8:40 AM 09/22/2020    8:19 AM 09/20/2019    8:19 AM 08/13/2017    8:22 AM  6CIT Screen  What Year? 0 points 0 points 0 points 0 points 0 points  What month? 0 points 0 points 0 points 0 points 0 points  What time? 0 points 0 points 0 points 0 points 0 points  Count back from 20 0 points 0 points 0 points 0 points 0 points  Months in reverse 0 points 0 points 0 points 0 points 0 points  Repeat phrase 0 points 0 points 0 points 0 points 0 points  Total Score 0 points 0 points 0 points 0 points 0 points    Immunizations Immunization History  Administered Date(s) Administered   PFIZER(Purple Top)SARS-COV-2 Vaccination 09/09/2019, 10/06/2019   Pneumococcal Polysaccharide-23 01/12/2007, 04/18/2009   Tdap 07/07/2007    TDAP status: Due, Education has been provided regarding the importance of this vaccine. Advised may receive this vaccine at local pharmacy or Health Dept. Aware to provide a copy of the vaccination record if obtained from local pharmacy or Health Dept. Verbalized acceptance and understanding.  Flu Vaccine status: Declined, Education has been provided regarding the importance of this vaccine  but patient still declined. Advised may receive this vaccine at local pharmacy or Health Dept. Aware to provide a copy of the vaccination record if obtained from local pharmacy or Health Dept. Verbalized acceptance and understanding.  Pneumococcal vaccine status: Up to date  Covid-19 vaccine status: Declined, Education has been provided regarding the importance of this vaccine but patient still declined. Advised may receive this vaccine at local pharmacy or Health Dept.or vaccine clinic. Aware to provide a copy of the vaccination record if obtained from local pharmacy or Health Dept. Verbalized acceptance and understanding.  Qualifies for Shingles Vaccine? Yes   Zostavax completed No   Shingrix Completed?: No.    Education has been provided regarding the importance of this vaccine. Patient has been advised to call insurance company to determine out of pocket expense if they have not yet received this vaccine. Advised may also receive vaccine at local pharmacy or Health Dept. Verbalized acceptance and understanding.  Screening Tests  Health Maintenance  Topic Date Due   Zoster Vaccines- Shingrix (1 of 2) Never done   COVID-19 Vaccine (3 - Pfizer risk series) 11/03/2019   PAP SMEAR-Modifier  01/20/2022   INFLUENZA VACCINE  09/05/2022   OPHTHALMOLOGY EXAM  12/12/2022   FOOT EXAM  01/22/2023   HEMOGLOBIN A1C  02/21/2023   Fecal DNA (Cologuard)  03/27/2023   Diabetic kidney evaluation - Urine ACR  05/22/2023   Diabetic kidney evaluation - eGFR measurement  08/21/2023   Medicare Annual Wellness (AWV)  10/01/2023   MAMMOGRAM  07/18/2024   Hepatitis C Screening  Completed   HIV Screening  Completed   HPV VACCINES  Aged Out   DTaP/Tdap/Td  Discontinued    Health Maintenance  Health Maintenance Due  Topic Date Due   Zoster Vaccines- Shingrix (1 of 2) Never done   COVID-19 Vaccine (3 - Pfizer risk series) 11/03/2019   PAP SMEAR-Modifier  01/20/2022   INFLUENZA VACCINE  09/05/2022     Colorectal cancer screening: Type of screening: Cologuard. Completed 03/26/20. Repeat every 3 years  Mammogram status: Completed 07/19/22. Repeat every year  Patient declined Papsmear  Lung Cancer Screening: (Low Dose CT Chest recommended if Age 18-80 years, 20 pack-year currently smoking OR have quit w/in 15years.) does not qualify.   Lung Cancer Screening Referral: n/a  Additional Screening:  Hepatitis C Screening: does not qualify; Completed 06/05/20  Vision Screening: Recommended annual ophthalmology exams for early detection of glaucoma and other disorders of the eye. Is the patient up to date with their annual eye exam?  Yes  Who is the provider or what is the name of the office in which the patient attends annual eye exams? [facility], Dr. Inez Pilgrim If pt is not established with a provider, would they like to be referred to a provider to establish care? No .   Dental Screening: Recommended annual dental exams for proper oral hygiene  Diabetic Foot Exam: Diabetic Foot Exam: Completed 01/21/22  Community Resource Referral / Chronic Care Management: CRR required this visit?  No   CCM required this visit?  No     Plan:     I have personally reviewed and noted the following in the patient's chart:   Medical and social history Use of alcohol, tobacco or illicit drugs  Current medications and supplements including opioid prescriptions. Patient is not currently taking opioid prescriptions. Functional ability and status Nutritional status Physical activity Advanced directives List of other physicians Hospitalizations, surgeries, and ER visits in previous 12 months Vitals Screenings to include cognitive, depression, and falls Referrals and appointments  In addition, I have reviewed and discussed with patient certain preventive protocols, quality metrics, and best practice recommendations. A written personalized care plan for preventive services as well as general  preventive health recommendations were provided to patient.     Tora Kindred, CMA   10/01/2022   After Visit Summary: (MyChart) Due to this being a telephonic visit, the after visit summary with patients personalized plan was offered to patient via MyChart   Nurse Notes:  Declined DM & Nutrition Education Declined flu and shingles Needs Tdap. Patient requested to get at next OV.

## 2022-11-04 DIAGNOSIS — I152 Hypertension secondary to endocrine disorders: Secondary | ICD-10-CM | POA: Diagnosis not present

## 2022-11-04 DIAGNOSIS — E1129 Type 2 diabetes mellitus with other diabetic kidney complication: Secondary | ICD-10-CM | POA: Diagnosis not present

## 2022-11-04 DIAGNOSIS — E1169 Type 2 diabetes mellitus with other specified complication: Secondary | ICD-10-CM | POA: Diagnosis not present

## 2022-11-04 DIAGNOSIS — R809 Proteinuria, unspecified: Secondary | ICD-10-CM | POA: Diagnosis not present

## 2022-11-04 DIAGNOSIS — Z794 Long term (current) use of insulin: Secondary | ICD-10-CM | POA: Diagnosis not present

## 2022-11-04 DIAGNOSIS — E785 Hyperlipidemia, unspecified: Secondary | ICD-10-CM | POA: Diagnosis not present

## 2022-11-04 DIAGNOSIS — E1159 Type 2 diabetes mellitus with other circulatory complications: Secondary | ICD-10-CM | POA: Diagnosis not present

## 2022-11-12 ENCOUNTER — Other Ambulatory Visit: Payer: Self-pay | Admitting: Internal Medicine

## 2022-11-12 ENCOUNTER — Other Ambulatory Visit: Payer: Self-pay | Admitting: Nurse Practitioner

## 2022-11-12 NOTE — Telephone Encounter (Signed)
Requested Prescriptions  Pending Prescriptions Disp Refills   atenolol (TENORMIN) 50 MG tablet [Pharmacy Med Name: ATENOLOL 50MG  TABLETS] 90 tablet 0    Sig: TAKE 1 TABLET(50 MG) BY MOUTH DAILY     Cardiovascular: Beta Blockers 2 Failed - 11/12/2022  7:03 AM      Failed - Cr in normal range and within 360 days    Creatinine  Date Value Ref Range Status  09/25/2013 0.75 0.60 - 1.30 mg/dL Final   Creatinine, Ser  Date Value Ref Range Status  08/21/2022 1.18 (H) 0.57 - 1.00 mg/dL Final         Passed - Last BP in normal range    BP Readings from Last 1 Encounters:  10/01/22 130/68         Passed - Last Heart Rate in normal range    Pulse Readings from Last 1 Encounters:  08/21/22 70         Passed - Valid encounter within last 6 months    Recent Outpatient Visits           2 months ago Type 2 diabetes mellitus treated with insulin (HCC)   [place] [facility] [place], [place] T, NP   3 months ago Nausea and vomiting in adult   [place] [facility] [place], [place] T, NP   5 months ago Type 2 diabetes mellitus treated with insulin (HCC)   [place] [facility] [place], [place] T, NP   7 months ago Right lower quadrant abdominal pain   [place] [facility] [place], [employer] [place] T, NP   8 months ago Subacute cough   [facility] [place], Dorie Rank, NP       Future Appointments             In 1 week Cannady, Dorie Rank, NP [place] [facility]

## 2022-11-20 IMAGING — DX DG CHEST 2V
2 series · 2 of 2 positions shown · non-contrast
Comparison: Chest x-ray 03/16/2020.

CLINICAL DATA: 49-year-old female with history of cough and
increasing shortness of breath. History of pneumonia.

EXAM:
CHEST - 2 VIEW

[chest pa]
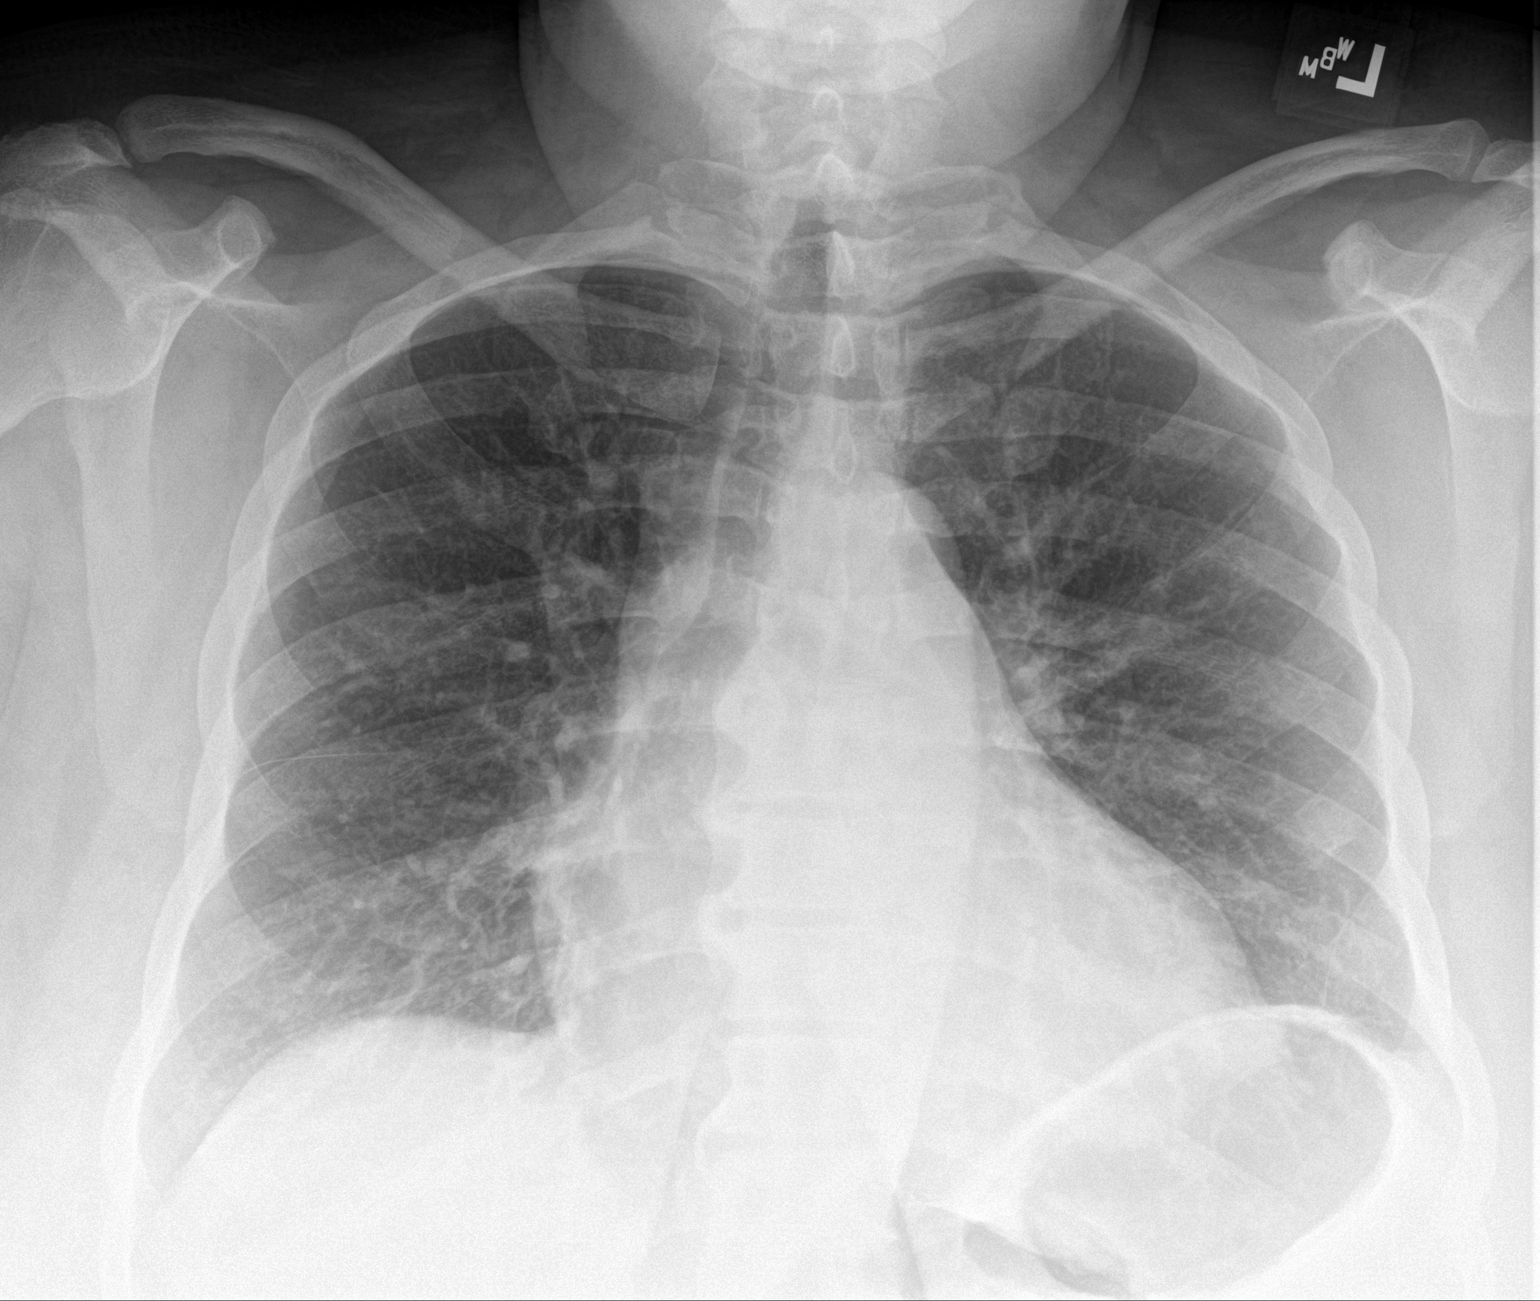

[chest lat]
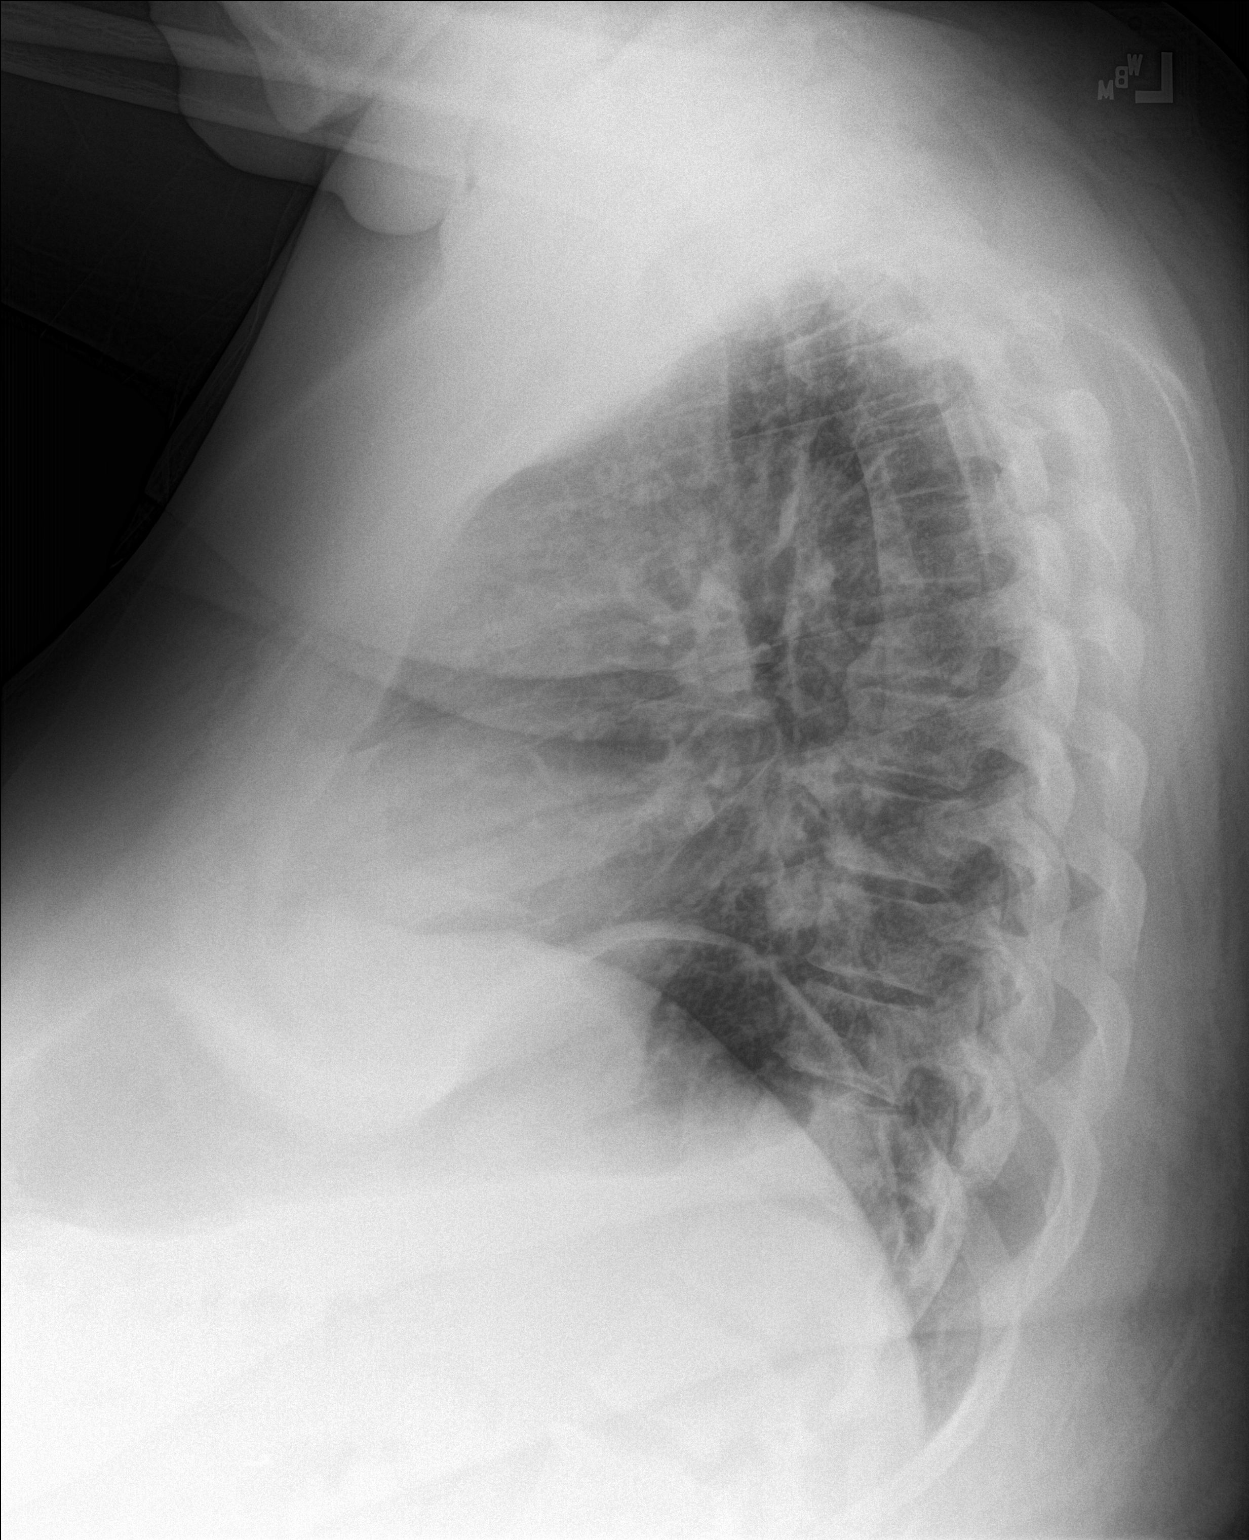

[2 of 2 positions shown; findings below may reference images not displayed]

FINDINGS: Lung volumes are normal. No consolidative airspace disease. No
pleural effusions. No pneumothorax. No pulmonary nodule or mass
noted. Pulmonary vasculature and the cardiomediastinal silhouette
are within normal limits.
IMPRESSION: No radiographic evidence of acute cardiopulmonary disease.

## 2022-11-21 ENCOUNTER — Ambulatory Visit: Payer: Medicare Other | Admitting: Nurse Practitioner

## 2022-11-21 DIAGNOSIS — E559 Vitamin D deficiency, unspecified: Secondary | ICD-10-CM

## 2022-11-21 DIAGNOSIS — E1169 Type 2 diabetes mellitus with other specified complication: Secondary | ICD-10-CM

## 2022-11-21 DIAGNOSIS — Z6841 Body Mass Index (BMI) 40.0 and over, adult: Secondary | ICD-10-CM

## 2022-11-21 DIAGNOSIS — I130 Hypertensive heart and chronic kidney disease with heart failure and stage 1 through stage 4 chronic kidney disease, or unspecified chronic kidney disease: Secondary | ICD-10-CM

## 2022-11-21 DIAGNOSIS — G4733 Obstructive sleep apnea (adult) (pediatric): Secondary | ICD-10-CM

## 2022-11-21 DIAGNOSIS — I428 Other cardiomyopathies: Secondary | ICD-10-CM

## 2022-11-21 DIAGNOSIS — Z124 Encounter for screening for malignant neoplasm of cervix: Secondary | ICD-10-CM

## 2022-11-21 DIAGNOSIS — N1831 Chronic kidney disease, stage 3a: Secondary | ICD-10-CM

## 2022-11-21 DIAGNOSIS — I5032 Chronic diastolic (congestive) heart failure: Secondary | ICD-10-CM

## 2022-11-21 DIAGNOSIS — Z794 Long term (current) use of insulin: Secondary | ICD-10-CM

## 2022-11-21 DIAGNOSIS — E1129 Type 2 diabetes mellitus with other diabetic kidney complication: Secondary | ICD-10-CM

## 2022-12-17 DIAGNOSIS — H2513 Age-related nuclear cataract, bilateral: Secondary | ICD-10-CM | POA: Diagnosis not present

## 2022-12-17 DIAGNOSIS — E113293 Type 2 diabetes mellitus with mild nonproliferative diabetic retinopathy without macular edema, bilateral: Secondary | ICD-10-CM | POA: Diagnosis not present

## 2022-12-17 DIAGNOSIS — H40003 Preglaucoma, unspecified, bilateral: Secondary | ICD-10-CM | POA: Diagnosis not present

## 2022-12-17 LAB — HM DIABETES EYE EXAM

## 2023-02-10 ENCOUNTER — Other Ambulatory Visit: Payer: Self-pay | Admitting: Nurse Practitioner

## 2023-02-11 ENCOUNTER — Other Ambulatory Visit: Payer: Self-pay | Admitting: Nurse Practitioner

## 2023-02-11 NOTE — Telephone Encounter (Signed)
 Courtesy refill given, appointment needed.   Requested Prescriptions  Pending Prescriptions Disp Refills   losartan  (COZAAR ) 25 MG tablet [Pharmacy Med Name: LOSARTAN  25MG  TABLETS] 30 tablet 0    Sig: Take 1 tablet (25 mg total) by mouth daily. OFFICE VISIT NEEDED FOR ADDITIONAL REFILLS     Cardiovascular:  Angiotensin Receptor Blockers Failed - 02/11/2023  8:21 PM      Failed - Cr in normal range and within 180 days    Creatinine  Date Value Ref Range Status  09/25/2013 0.75 0.60 - 1.30 mg/dL Final   Creatinine, Ser  Date Value Ref Range Status  08/21/2022 1.18 (H) 0.57 - 1.00 mg/dL Final         Passed - K in normal range and within 180 days    Potassium  Date Value Ref Range Status  08/21/2022 4.4 3.5 - 5.2 mmol/L Final  09/25/2013 4.2 3.5 - 5.1 mmol/L Final         Passed - Patient is not pregnant      Passed - Last BP in normal range    BP Readings from Last 1 Encounters:  10/01/22 130/68         Passed - Valid encounter within last 6 months    Recent Outpatient Visits           5 months ago Type 2 diabetes mellitus treated with insulin  (HCC)   [facility] [place], Melanie T, NP   6 months ago Nausea and vomiting in adult   [place] [facility] [place], [place] T, NP   8 months ago Type 2 diabetes mellitus treated with insulin  (HCC)   [place] [facility] [place], [place] T, NP   10 months ago Right lower quadrant abdominal pain   [place] [facility] [place], [place] T, NP   11 months ago Subacute cough   [facility] [place], Melanie DASEN, NP

## 2023-02-14 NOTE — Telephone Encounter (Signed)
 Refilled already on 02/11/23, will refuse this request.  Requested Prescriptions  Pending Prescriptions Disp Refills   losartan  (COZAAR ) 25 MG tablet [Pharmacy Med Name: LOSARTAN  25MG  TABLETS] 90 tablet     Sig: TAKE 1 TABLET BY MOUTH DAILY     Cardiovascular:  Angiotensin Receptor Blockers Failed - 02/14/2023  6:07 PM      Failed - Cr in normal range and within 180 days    Creatinine  Date Value Ref Range Status  09/25/2013 0.75 0.60 - 1.30 mg/dL Final   Creatinine, Ser  Date Value Ref Range Status  08/21/2022 1.18 (H) 0.57 - 1.00 mg/dL Final         Passed - K in normal range and within 180 days    Potassium  Date Value Ref Range Status  08/21/2022 4.4 3.5 - 5.2 mmol/L Final  09/25/2013 4.2 3.5 - 5.1 mmol/L Final         Passed - Patient is not pregnant      Passed - Last BP in normal range    BP Readings from Last 1 Encounters:  10/01/22 130/68         Passed - Valid encounter within last 6 months    Recent Outpatient Visits           5 months ago Type 2 diabetes mellitus treated with insulin  (HCC)   [place] [facility] [place], [place] T, NP   7 months ago Nausea and vomiting in adult   [place] [facility] [place], [place] T, NP   8 months ago Type 2 diabetes mellitus treated with insulin  (HCC)   [place] [facility], [place] T, NP   10 months ago Right lower quadrant abdominal pain   [place] [facility] [place], [place] T, NP   12 months ago Subacute cough    [facility] [place], Melanie DASEN, NP

## 2023-02-21 DIAGNOSIS — I152 Hypertension secondary to endocrine disorders: Secondary | ICD-10-CM | POA: Diagnosis not present

## 2023-02-21 DIAGNOSIS — Z6841 Body Mass Index (BMI) 40.0 and over, adult: Secondary | ICD-10-CM | POA: Diagnosis not present

## 2023-02-21 DIAGNOSIS — E1169 Type 2 diabetes mellitus with other specified complication: Secondary | ICD-10-CM | POA: Diagnosis not present

## 2023-02-21 DIAGNOSIS — E1129 Type 2 diabetes mellitus with other diabetic kidney complication: Secondary | ICD-10-CM | POA: Diagnosis not present

## 2023-02-21 DIAGNOSIS — E1159 Type 2 diabetes mellitus with other circulatory complications: Secondary | ICD-10-CM | POA: Diagnosis not present

## 2023-02-21 DIAGNOSIS — E785 Hyperlipidemia, unspecified: Secondary | ICD-10-CM | POA: Diagnosis not present

## 2023-02-21 DIAGNOSIS — R809 Proteinuria, unspecified: Secondary | ICD-10-CM | POA: Diagnosis not present

## 2023-02-21 DIAGNOSIS — Z794 Long term (current) use of insulin: Secondary | ICD-10-CM | POA: Diagnosis not present

## 2023-03-03 ENCOUNTER — Telehealth: Payer: Self-pay

## 2023-03-03 NOTE — Progress Notes (Signed)
Care Guide Pharmacy Note  03/03/2023 Name: Sara Zhang MRN: [id number] DOB: 08/13/1970  Referred By: Marjie Skiff, NP Reason for referral: Care Coordination (TNM Diabetes. )   Sara Zhang is a 53 y.o. year old female who is a primary care patient of Cannady, Dorie Rank, NP.  Sara Zhang was referred to the pharmacist for assistance related to: DMII  Successful contact was made with the patient to discuss pharmacy services.  Patient declines engagement at this time. Contact information was provided to the patient should they wish to reach out for assistance at a later time.  [facility]  [facility], [facility] Health Care Management Assistant Direct Dial: [phone]  Fax: [phone]

## 2023-03-09 LAB — HEMOGLOBIN A1C: A1c: 7.2

## 2023-03-09 NOTE — Patient Instructions (Signed)
 Be Involved in Caring For Your Health:  Taking Medications When medications are taken as directed, they can greatly improve your health. But if they are not taken as prescribed, they may not work. In some cases, not taking them correctly can be harmful. To help ensure your treatment remains effective and safe, understand your medications and how to take them. Bring your medications to each visit for review by your provider.  Your lab results, notes, and after visit summary will be available on My Chart. We strongly encourage you to use this feature. If lab results are abnormal the clinic will contact you with the appropriate steps. If the clinic does not contact you assume the results are satisfactory. You can always view your results on My Chart. If you have questions regarding your health or results, please contact the clinic during office hours. You can also ask questions on My Chart.  We at [facility] are grateful that you chose [place] to provide your care. We strive to provide evidence-based and compassionate care and are always looking for feedback. If you get a survey from the clinic please complete this so we can hear your opinions.  Diabetes Mellitus and Foot Care Diabetes, also called diabetes mellitus, may cause problems with your feet and legs because of poor blood flow (circulation). Poor circulation may make your skin: Become thinner and drier. Break more easily. Heal more slowly. Peel and crack. You may also have nerve damage (neuropathy). This can cause decreased feeling in your legs and feet. This means that you may not notice minor injuries to your feet that could lead to more serious problems. Finding and treating problems early is the best way to prevent future foot problems. How to care for your feet Foot hygiene  Wash your feet daily with warm water and mild soap. Do not use hot water. Then, pat your feet and the areas between your toes until they are fully dry. Do  not soak your feet. This can dry your skin. Trim your toenails straight across. Do not dig under them or around the cuticle. File the edges of your nails with an emery board or nail file. Apply a moisturizing lotion or petroleum jelly to the skin on your feet and to dry, brittle toenails. Use lotion that does not contain alcohol and is unscented. Do not apply lotion between your toes. Shoes and socks Wear clean socks or stockings every day. Make sure they are not too tight. Do not wear knee-high stockings. These may decrease blood flow to your legs. Wear shoes that fit well and have enough cushioning. Always look in your shoes before you put them on to be sure there are no objects inside. To break in new shoes, wear them for just a few hours a day. This prevents injuries on your feet. Wounds, scrapes, corns, and calluses  Check your feet daily for blisters, cuts, bruises, sores, and redness. If you cannot see the bottom of your feet, use a mirror or ask someone for help. Do not cut off corns or calluses or try to remove them with medicine. If you find a minor scrape, cut, or break in the skin on your feet, keep it and the skin around it clean and dry. You may clean these areas with mild soap and water. Do not clean the area with peroxide, alcohol, or iodine. If you have a wound, scrape, corn, or callus on your foot, look at it several times a day to make sure it  is healing and not infected. Check for: Redness, swelling, or pain. Fluid or blood. Warmth. Pus or a bad smell. General tips Do not cross your legs. This may decrease blood flow to your feet. Do not use heating pads or hot water bottles on your feet. They may burn your skin. If you have lost feeling in your feet or legs, you may not know this is happening until it is too late. Protect your feet from hot and cold by wearing shoes, such as at the beach or on hot pavement. Schedule a complete foot exam at least once a year or more often if  you have foot problems. Report any cuts, sores, or bruises to your health care provider right away. Where to find more information American Diabetes Association: diabetes.org Association of Diabetes Care & Education Specialists: diabeteseducator.org Contact a health care provider if: You have a condition that increases your risk of infection, and you have any cuts, sores, or bruises on your feet. You have an injury that is not healing. You have redness on your legs or feet. You feel burning or tingling in your legs or feet. You have pain or cramps in your legs and feet. Your legs or feet are numb. Your feet always feel cold. You have pain around any toenails. Get help right away if: You have a wound, scrape, corn, or callus on your foot and: You have signs of infection. You have a fever. You have a red line going up your leg. This information is not intended to replace advice given to you by your health care provider. Make sure you discuss any questions you have with your health care provider. Document Revised: 07/25/2021 Document Reviewed: 07/25/2021 Elsevier Patient Education  2024 [employer].

## 2023-03-12 ENCOUNTER — Ambulatory Visit (INDEPENDENT_AMBULATORY_CARE_PROVIDER_SITE_OTHER): Payer: Medicare Other | Admitting: Nurse Practitioner

## 2023-03-12 ENCOUNTER — Other Ambulatory Visit: Payer: Self-pay | Admitting: Nurse Practitioner

## 2023-03-12 ENCOUNTER — Encounter: Payer: Self-pay | Admitting: Nurse Practitioner

## 2023-03-12 VITALS — BP 126/79 | HR 81 | Temp 98.1°F | Ht 69.0 in | Wt 347.2 lb

## 2023-03-12 DIAGNOSIS — E119 Type 2 diabetes mellitus without complications: Secondary | ICD-10-CM | POA: Diagnosis not present

## 2023-03-12 DIAGNOSIS — I5032 Chronic diastolic (congestive) heart failure: Secondary | ICD-10-CM

## 2023-03-12 DIAGNOSIS — N1831 Chronic kidney disease, stage 3a: Secondary | ICD-10-CM

## 2023-03-12 DIAGNOSIS — I428 Other cardiomyopathies: Secondary | ICD-10-CM

## 2023-03-12 DIAGNOSIS — I130 Hypertensive heart and chronic kidney disease with heart failure and stage 1 through stage 4 chronic kidney disease, or unspecified chronic kidney disease: Secondary | ICD-10-CM | POA: Diagnosis not present

## 2023-03-12 DIAGNOSIS — J45991 Cough variant asthma: Secondary | ICD-10-CM

## 2023-03-12 DIAGNOSIS — E1169 Type 2 diabetes mellitus with other specified complication: Secondary | ICD-10-CM

## 2023-03-12 DIAGNOSIS — E538 Deficiency of other specified B group vitamins: Secondary | ICD-10-CM

## 2023-03-12 DIAGNOSIS — N182 Chronic kidney disease, stage 2 (mild): Secondary | ICD-10-CM | POA: Diagnosis not present

## 2023-03-12 DIAGNOSIS — Z6841 Body Mass Index (BMI) 40.0 and over, adult: Secondary | ICD-10-CM

## 2023-03-12 DIAGNOSIS — Z794 Long term (current) use of insulin: Secondary | ICD-10-CM

## 2023-03-12 DIAGNOSIS — R809 Proteinuria, unspecified: Secondary | ICD-10-CM | POA: Diagnosis not present

## 2023-03-12 DIAGNOSIS — E559 Vitamin D deficiency, unspecified: Secondary | ICD-10-CM

## 2023-03-12 DIAGNOSIS — E1129 Type 2 diabetes mellitus with other diabetic kidney complication: Secondary | ICD-10-CM | POA: Diagnosis not present

## 2023-03-12 DIAGNOSIS — E785 Hyperlipidemia, unspecified: Secondary | ICD-10-CM | POA: Diagnosis not present

## 2023-03-12 DIAGNOSIS — G4733 Obstructive sleep apnea (adult) (pediatric): Secondary | ICD-10-CM

## 2023-03-12 LAB — MICROALBUMIN, URINE WAIVED
Creatinine, Urine Waived: 50 mg/dL (ref 10–300)
Microalb, Ur Waived: 150 mg/L — ABNORMAL HIGH (ref 0–19)
Microalb/Creat Ratio: >300 mg/g — ABNORMAL HIGH (ref <30)

## 2023-03-12 MED ORDER — AMLODIPINE BESYLATE 5 MG PO TABS: ORAL_TABLET | ORAL | 3 refills | Status: AC

## 2023-03-12 MED ORDER — LOSARTAN POTASSIUM 25 MG PO TABS: 25.0000 mg | ORAL_TABLET | Freq: Every day | ORAL | 2 refills | Status: DC

## 2023-03-12 MED ORDER — ATORVASTATIN CALCIUM 10 MG PO TABS: ORAL_TABLET | ORAL | 3 refills | Status: DC

## 2023-03-12 MED ORDER — ATENOLOL 50 MG PO TABS: 50.0000 mg | ORAL_TABLET | Freq: Every day | ORAL | 3 refills | Status: DC

## 2023-03-12 NOTE — Assessment & Plan Note (Signed)
Chronic, ongoing.  Continue collaboration with cardiology and current medication regimen as prescribed by them.  Recent notes reviewed.

## 2023-03-12 NOTE — Assessment & Plan Note (Signed)
 Chronic, ongoing.  Followed by endo.  A1c 7.2% recently with endo. Urine ALB  13 February 2022, recheck today. Continue collaboration with endocrinology, recent notes reviewed and labs. - Continue current medication regimen as ordered by them.  Ensure to attend visits with endo consistently.   - Recommend she continue to monitor BS at home 2-3 times a day.   - Ensure healthy diet choices at home, reduce candy. - Foot and eye exams up to date. - Vaccinations -- refuses - ARB and statin on board

## 2023-03-12 NOTE — Assessment & Plan Note (Signed)
 BMI 51.27 with diabetes, HTN, HF.  Recommended eating smaller high protein, low fat meals more frequently and exercising 30 mins a day 5 times a week with a goal of 10-15lb weight loss in the next 3 months. Patient voiced their understanding and motivation to adhere to these recommendations.

## 2023-03-12 NOTE — Assessment & Plan Note (Signed)
Chronic, stable at this time without inhalers. Continue O2 2L at night.

## 2023-03-12 NOTE — Assessment & Plan Note (Signed)
 Chronic, stable.  BP well below goal today.  Continue current medication regimen and collaboration with cardiology.  Recommend checking BP three mornings a week at home + documenting for providers. Education and recommendation provided today on DASH diet focus and avoidance of high sodium foods, like pork skins.  DASH diet focus.  LABS: CBC, TSH, CMP.

## 2023-03-12 NOTE — Assessment & Plan Note (Signed)
Chronic, ongoing.  Continue current medication regimen as is at goal.  Lipid panel today. 

## 2023-03-12 NOTE — Assessment & Plan Note (Signed)
Chronic, ongoing with BP at goal.  Euvolemic today.  Continue current medication regimen and collaboration with cardiology.  Recommend continued focus on diet and salt reduction.  Recommend: - Reminded to call for an overnight weight gain of >2 pounds or a weekly weight weight of >5 pounds - not adding salt to his food and has been reading food labels. Reviewed the importance of keeping daily sodium intake to <2000mg daily -- NO PORK SKINS - Avoid Ibuprofen 

## 2023-03-12 NOTE — Progress Notes (Signed)
 BP 126/79   Pulse 81   Temp 98.1 F (36.7 C) (Oral)   Ht 5' 9 (1.753 m)   Wt (!) 347 lb 3.2 oz (157.5 kg)   SpO2 95%   BMI 51.27 kg/m    Subjective:    Patient ID: Sara Zhang, female    DOB: Jan 20, 1971, 53 y.o.   MRN: [id number]  HPI: Sara Zhang is a 53 y.o. female  Chief Complaint  Patient presents with   Asthma   Congestive Heart Failure   Gastroesophageal Reflux   Diabetes   Hyperlipidemia   Hypertension   DIABETES Last A1c on 02/21/23 and A1c 7.2%.  Taking Jardiance  25 MG, Basaglar  52 units QHS, and Mounjaro 12.5 MG weekly.  Last saw endocrinology 02/21/23, with increase in Mounjaro.  She is snacking less with Mounjaro on board.  Continues on Vitamin D  and B12 supplements daily. Polydipsia/polyuria: no Visual disturbance: no Chest pain: no Paresthesias: no Glucose Monitoring: yes             Accucheck frequency: BID             Fasting glucose: 90 to 125 range, had occasional 70             Evening: <180 over past two weeks             Before meals: Taking Insulin ?: yes             Long acting insulin : 52 units             Short acting insulin : Blood Pressure Monitoring: not checking Retinal Examination: Up to Date -- [facility], Dr. Mittie Foot Exam: Up to Date Pneumovax: Up to Date Influenza: Not Up to Date -- refuses Aspirin: no    HYPERTENSION / HYPERLIPIDEMIA/HF Taking Losartan  (Lisinopril  caused cough), Furosemide , Amlodipine , Atenolol  + Lipitor for HLD.  Saw cardiology 03/29/22 -- to return in one year, has upcoming visit.  Last echo in 2020 October was 55-60%. Satisfied with current treatment? yes Duration of hypertension: chronic BP monitoring frequency: not checking BP range:  BP medication side effects: no Duration of hyperlipidemia: chronic Cholesterol medication side effects: no Cholesterol supplements: none Medication compliance: good compliance Aspirin: no Recent stressors: no Recurrent headaches: no Visual changes:  no Palpitations: no Dyspnea: no Chest pain: no Lower extremity edema: no Dizzy/lightheaded: no The ASCVD Risk score (Arnett DK, et al., 2019) failed to calculate for the following reasons:   The valid total cholesterol range is 130 to 320 mg/dL  CHRONIC KIDNEY DISEASE Recent levels have improved. CKD status: stable Medications renally dose: yes Previous renal evaluation: no Pneumovax:  Up to Date Influenza Vaccine:  Refuses    ASTHMA Continues to use O2 every night due to OSA and CPAP.  Last saw sleep provider on 03/25/22.  No current inhaler regimen. Asthma status: stable Satisfied with current treatment?: yes Albuterol /rescue inhaler frequency: none Dyspnea frequency: no Wheezing frequency: no Cough frequency: no Nocturnal symptom frequency: no Limitation of activity: no Current upper respiratory symptoms: no Triggers: enviromental Aerochamber/spacer use: no Visits to ER or Urgent Care in past year: no Pneumovax: Up to Date Influenza:  refuses     Relevant past medical, surgical, family and social history reviewed and updated as indicated. Interim medical history since our last visit reviewed. Allergies and medications reviewed and updated.  Review of Systems  Constitutional:  Negative for activity change, appetite change, diaphoresis, fatigue and fever.  Respiratory:  Negative for cough, chest tightness,  shortness of breath and wheezing.   Cardiovascular:  Negative for chest pain, palpitations and leg swelling.  Gastrointestinal: Negative.   Endocrine: Negative for cold intolerance, heat intolerance, polydipsia, polyphagia and polyuria.  Neurological: Negative.   Psychiatric/Behavioral: Negative.      Per HPI unless specifically indicated above     Objective:    BP 126/79   Pulse 81   Temp 98.1 F (36.7 C) (Oral)   Ht 5' 9 (1.753 m)   Wt (!) 347 lb 3.2 oz (157.5 kg)   SpO2 95%   BMI 51.27 kg/m   Wt Readings from Last 3 Encounters:  03/12/23 (!) 347 lb  3.2 oz (157.5 kg)  10/01/22 (!) 363 lb 3.2 oz (164.7 kg)  08/21/22 (!) 348 lb 3.2 oz (157.9 kg)    Physical Exam Vitals and nursing note reviewed.  Constitutional:      General: She is awake. She is not in acute distress.    Appearance: She is well-developed and well-groomed. She is morbidly obese. She is not ill-appearing or toxic-appearing.  HENT:     Head: Normocephalic.     Right Ear: Hearing and external ear normal.     Left Ear: Hearing and external ear normal.  Eyes:     General: Lids are normal. No scleral icterus.       Right eye: No discharge.        Left eye: No discharge.     Conjunctiva/sclera: Conjunctivae normal.     Pupils: Pupils are equal, round, and reactive to light.  Neck:     Thyroid : No thyromegaly.     Vascular: No carotid bruit.  Cardiovascular:     Rate and Rhythm: Normal rate and regular rhythm.     Heart sounds: Normal heart sounds. No murmur heard.    No gallop.  Pulmonary:     Effort: Pulmonary effort is normal. No accessory muscle usage or respiratory distress.     Breath sounds: Normal breath sounds. No decreased breath sounds, wheezing or rhonchi.  Abdominal:     General: Abdomen is protuberant. Bowel sounds are normal. There is no distension.     Palpations: Abdomen is soft.     Tenderness: There is no abdominal tenderness.  Musculoskeletal:     Cervical back: Normal range of motion and neck supple.     Right lower leg: Edema (trace) present.     Left lower leg: Edema (trace) present.  Lymphadenopathy:     Cervical: No cervical adenopathy.  Skin:    General: Skin is warm and dry.     Capillary Refill: Capillary refill takes less than 2 seconds.  Neurological:     Mental Status: She is alert and oriented to person, place, and time.  Psychiatric:        Attention and Perception: Attention normal.        Mood and Affect: Mood and affect normal.        Speech: Speech normal.        Behavior: Behavior normal. Behavior is cooperative.         Thought Content: Thought content normal.    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  BP well below goal today.  Continue current medication regimen and collaboration with cardiology.  Recommend checking BP three mornings a week at home + documenting for providers. Education and recommendation provided today on DASH diet focus and avoidance of high sodium foods, like pork skins.  DASH diet focus.  LABS: CBC, TSH, CMP.        Relevant Medications   losartan  (COZAAR ) 25 MG tablet   atorvastatin  (LIPITOR) 10 MG tablet   atenolol  (TENORMIN ) 50 MG tablet   amLODipine  (NORVASC ) 5 MG tablet   Other Relevant Orders   Comprehensive metabolic panel   CBC with Differential/Platelet   TSH   Chronic heart failure with preserved ejection fraction (HCC)   Chronic, ongoing with BP at goal.  Euvolemic today.  Continue current medication regimen and collaboration with cardiology.  Recommend continued focus on diet and salt reduction.  Recommend: - Reminded to call for an overnight weight gain of >2 pounds or a weekly weight weight of >5 pounds - not adding salt to his food and has been reading food labels. Reviewed the importance of keeping daily sodium intake to 2000mg  daily -- NO PORK SKINS - Avoid Ibuprofen      Relevant Medications   losartan  (COZAAR ) 25 MG tablet   atorvastatin  (LIPITOR) 10 MG  tablet   atenolol  (TENORMIN ) 50 MG tablet   amLODipine  (NORVASC ) 5 MG tablet   Other Relevant Orders   Comprehensive metabolic panel   Lipid Panel w/o Chol/HDL Ratio   CBC with Differential/Platelet   NICM (nonischemic cardiomyopathy) (HCC)   Chronic, ongoing.  Continue collaboration with cardiology and current medication regimen as prescribed by them.  Recent notes reviewed.      Relevant Medications   losartan  (COZAAR ) 25 MG tablet   atorvastatin  (LIPITOR) 10 MG tablet   atenolol  (TENORMIN ) 50 MG tablet   amLODipine  (NORVASC ) 5 MG tablet     Respiratory   Cough variant asthma vs Upper Airway cough syndrome   Chronic, stable at this time without inhalers. Continue O2 2L at night.          Endocrine   Hyperlipidemia associated with type 2 diabetes mellitus (HCC)   Chronic, ongoing.  Continue current medication regimen as is at goal.  Lipid panel today.      Relevant Medications   tirzepatide (MOUNJARO) 15 MG/0.5ML Pen (Start on 03/24/2023)   losartan  (COZAAR ) 25 MG tablet   atorvastatin  (LIPITOR) 10 MG tablet   atenolol  (TENORMIN ) 50 MG tablet   amLODipine  (NORVASC ) 5 MG tablet   Other Relevant Orders   Comprehensive metabolic panel   Proteinuria due to type 2 diabetes mellitus (HCC)   Chronic, ongoing.  Followed by endo.  A1c 7.2% recently with endo. Urine ALB  13 February 2022, recheck today. Continue collaboration with endocrinology, recent notes reviewed and labs. - Continue current medication regimen as ordered by them.  Ensure to attend visits with endo consistently.   - Recommend she continue to monitor BS at home 2-3 times a day.   - Ensure healthy diet choices at home, reduce candy. - Foot and eye exams up to date. - Vaccinations -- refuses - ARB  and statin on board      Relevant Medications   tirzepatide [facility]) 15 MG/0.5ML Pen (Start on 03/24/2023)   losartan  (COZAAR ) 25 MG tablet   atorvastatin  (LIPITOR) 10 MG tablet   Other Relevant Orders    Microalbumin, Urine Waived   Comprehensive metabolic panel   Type 2 diabetes mellitus treated with insulin  (HCC) - Primary   Chronic, ongoing.  Followed by endo.  A1c 7.2% recently with endo. Urine ALB  13 February 2022, recheck today. Continue collaboration with endocrinology, recent notes reviewed and labs. - Continue current medication regimen as ordered by them.  Ensure to attend visits with endo consistently.   - Recommend she continue to monitor BS at home 2-3 times a day.   - Ensure healthy diet choices at home, reduce candy. - Foot and eye exams up to date. - Vaccinations -- refuses - ARB and statin on board      Relevant Medications   tirzepatide [facility]) 15 MG/0.5ML Pen (Start on 03/24/2023)   losartan  (COZAAR ) 25 MG tablet   atorvastatin  (LIPITOR) 10 MG tablet   Other Relevant Orders   Microalbumin, Urine Waived   Comprehensive metabolic panel     Genitourinary   CKD (chronic kidney disease) stage 3, GFR 30-59 ml/min (HCC)   Chronic, ongoing.  Continue to monitor closely.  Maintain ARB on board for protection.  Avoid Ibuprofen products.  Refer to nephrology as needed and renal dose medications as needed.  Educated patient on good hydration at home.      Relevant Orders   Microalbumin, Urine Waived   Comprehensive metabolic panel   CBC with Differential/Platelet     Other   B12 deficiency   Chronic, ongoing.  Vitamin B12 level checked today and continue supplement daily.      Relevant Orders   Vitamin B12   BMI 50.0-59.9, adult (HCC)   Refer to morbid obesity plan of care.      Relevant Medications   tirzepatide (MOUNJARO) 15 MG/0.5ML Pen (Start on 03/24/2023)   Morbid obesity (HCC)   BMI 51.27 with diabetes, HTN, HF.  Recommended eating smaller high protein, low fat meals more frequently and exercising 30 mins a day 5 times a week with a goal of 10-15lb weight loss in the next 3 months. Patient voiced their understanding and motivation to adhere to these  recommendations.       Relevant Medications   tirzepatide (MOUNJARO) 15 MG/0.5ML Pen (Start on 03/24/2023)   Vitamin D  deficiency   Chronic, ongoing.  Continue supplement and recheck Vit D in office today.      Relevant Orders   VITAMIN D  25 Hydroxy (Vit-D Deficiency, Fractures)     Follow up plan: Return in about 3 months (around 06/09/2023) for T2DM, HTN/HLD, CKD, ASTHMA.

## 2023-03-12 NOTE — Assessment & Plan Note (Signed)
 Refer to morbid obesity plan of care.

## 2023-03-12 NOTE — Assessment & Plan Note (Signed)
Chronic, ongoing.  Vitamin B12 level checked today and continue supplement daily.

## 2023-03-12 NOTE — Assessment & Plan Note (Signed)
Chronic, ongoing.  Continue to monitor closely.  Maintain ARB on board for protection.  Avoid Ibuprofen products.  Refer to nephrology as needed and renal dose medications as needed.  Educated patient on good hydration at home.

## 2023-03-12 NOTE — Assessment & Plan Note (Signed)
Chronic, ongoing.  Continue supplement and recheck Vit D in office today. 

## 2023-03-13 ENCOUNTER — Other Ambulatory Visit: Payer: Self-pay | Admitting: Nurse Practitioner

## 2023-03-13 ENCOUNTER — Encounter: Payer: Self-pay | Admitting: Nurse Practitioner

## 2023-03-13 LAB — CBC WITH DIFFERENTIAL/PLATELET
Basophils Absolute: 0.1 10*3/uL (ref 0.0–0.2)
Basos: 1 %
EOS (ABSOLUTE): 0.4 10*3/uL (ref 0.0–0.4)
Eos: 5 %
Hematocrit: 41 % (ref 34.0–46.6)
Hemoglobin: 12.7 g/dL (ref 11.1–15.9)
Immature Grans (Abs): 0 10*3/uL (ref 0.0–0.1)
Immature Granulocytes: 0 %
Lymphocytes Absolute: 1.8 10*3/uL (ref 0.7–3.1)
Lymphs: 24 %
MCH: 26 pg — ABNORMAL LOW (ref 26.6–33.0)
MCHC: 31 g/dL — ABNORMAL LOW (ref 31.5–35.7)
MCV: 84 fL (ref 79–97)
Monocytes Absolute: 0.7 10*3/uL (ref 0.1–0.9)
Monocytes: 9 %
Neutrophils Absolute: 4.5 10*3/uL (ref 1.4–7.0)
Neutrophils: 61 %
Platelets: 346 10*3/uL (ref 150–450)
RBC: 4.89 x10E6/uL (ref 3.77–5.28)
RDW: 13.9 % (ref 11.7–15.4)
WBC: 7.4 10*3/uL (ref 3.4–10.8)

## 2023-03-13 LAB — COMPREHENSIVE METABOLIC PANEL
ALT: 10 [IU]/L (ref 0–32)
AST: 15 [IU]/L (ref 0–40)
Albumin: 3.9 g/dL (ref 3.8–4.9)
Alkaline Phosphatase: 154 [IU]/L — ABNORMAL HIGH (ref 44–121)
BUN/Creatinine Ratio: 14 (ref 9–23)
BUN: 16 mg/dL (ref 6–24)
Bilirubin Total: 0.3 mg/dL (ref 0.0–1.2)
CO2: 24 mmol/L (ref 20–29)
Calcium: 7.9 mg/dL — ABNORMAL LOW (ref 8.7–10.2)
Chloride: 102 mmol/L (ref 96–106)
Creatinine, Ser: 1.13 mg/dL — ABNORMAL HIGH (ref 0.57–1.00)
Globulin, Total: 3 g/dL (ref 1.5–4.5)
Glucose: 128 mg/dL — ABNORMAL HIGH (ref 70–99)
Potassium: 4.6 mmol/L (ref 3.5–5.2)
Sodium: 145 mmol/L — ABNORMAL HIGH (ref 134–144)
Total Protein: 6.9 g/dL (ref 6.0–8.5)
eGFR: 58 mL/min/{1.73_m2} — ABNORMAL LOW (ref >59)

## 2023-03-13 LAB — LIPID PANEL W/O CHOL/HDL RATIO
Cholesterol, Total: 121 mg/dL (ref 100–199)
HDL: 44 mg/dL (ref >39)
LDL Chol Calc (NIH): 63 mg/dL (ref 0–99)
Triglycerides: 68 mg/dL (ref 0–149)
VLDL Cholesterol Cal: 14 mg/dL (ref 5–40)

## 2023-03-13 LAB — VITAMIN D 25 HYDROXY (VIT D DEFICIENCY, FRACTURES): Vit D, 25-Hydroxy: 14.6 ng/mL — ABNORMAL LOW (ref 30.0–100.0)

## 2023-03-13 LAB — VITAMIN B12: Vitamin B-12: 562 pg/mL (ref 232–1245)

## 2023-03-13 LAB — TSH: TSH: 2.35 u[IU]/mL (ref 0.450–4.500)

## 2023-03-13 MED ORDER — CHOLECALCIFEROL 1.25 MG (50000 UT) PO TABS: 1.0000 | ORAL_TABLET | ORAL | 12 refills | Status: AC

## 2023-03-13 NOTE — Telephone Encounter (Signed)
 Duplicate request, last refilled 03/12/23 for 90 days.  Requested Prescriptions  Pending Prescriptions Disp Refills   losartan  (COZAAR ) 25 MG tablet [Pharmacy Med Name: LOSARTAN  25MG  TABLETS] 90 tablet     Sig: TAKE 1 TABLET BY MOUTH DAILY     Cardiovascular:  Angiotensin Receptor Blockers Failed - 03/13/2023  1:14 PM      Failed - Cr in normal range and within 180 days    Creatinine  Date Value Ref Range Status  09/25/2013 0.75 0.60 - 1.30 mg/dL Final   Creatinine, Ser  Date Value Ref Range Status  03/12/2023 1.13 (H) 0.57 - 1.00 mg/dL Final         Passed - K in normal range and within 180 days    Potassium  Date Value Ref Range Status  03/12/2023 4.6 3.5 - 5.2 mmol/L Final  09/25/2013 4.2 3.5 - 5.1 mmol/L Final         Passed - Patient is not pregnant      Passed - Last BP in normal range    BP Readings from Last 1 Encounters:  03/12/23 126/79         Passed - Valid encounter within last 6 months    Recent Outpatient Visits           6 months ago Type 2 diabetes mellitus treated with insulin  (HCC)   [place] [facility] [place], Roland T, NP   7 months ago Nausea and vomiting in adult   [place] [facility] [place], [place] T, NP   9 months ago Type 2 diabetes mellitus treated with insulin  (HCC)   [place] [facility] [place], [place] T, NP   11 months ago Right lower quadrant abdominal pain   [place] [facility] [place], Melanie T, NP   1 year ago Subacute cough   [place] [facility] [place], Melanie DASEN, NP       Future Appointments             In 3 months [place], Jolene T, NP [place] [facility]

## 2023-03-13 NOTE — Progress Notes (Signed)
 Contacted via MyChart   Good evening Sara Zhang, your labs have returned: - Kidney function, eGFR and creatinine, continues to show stable stage 3a kidney disease with no worsening.  We will continue to monitor closely.  Calcium  remains a little low, as is Vitamin D .  I am going to send in a weekly Vitamin D  supplement for you to start taking for now.  Continue calcium  supplement at home.   - Sodium, salt, level a little high.  Ensure less salt in diet and more water. - Remainder of labs overall stable.  Any questions? Keep being amazing!!  Thank you for allowing me to participate in your care.  I appreciate you. Kindest regards, Brandolyn Shortridge

## 2023-03-30 NOTE — Progress Notes (Unsigned)
 [facility] [address] [place], [place] [address]  Pulmonary Sleep Medicine   Office Visit Note  Patient Name: Sara Zhang DOB: 08-07-1970 MRN [id number]    Chief Complaint: Obstructive Sleep Apnea visit  Brief History:  Arbell is seen today for an annual follow up on Auto BIPAP min EPAP 12, Max IPAP 25, PS 5.  The patient has a 10 year  history of sleep apnea. Patient is using PAP nightly.  The patient feels *** after sleeping with PAP.  The patient reports *** from PAP use. Reported sleepiness is  *** and the Epworth Sleepiness Score is *** out of 24. The patient *** take naps. The patient complains of the following: ***  The compliance download shows 90% compliance with an average use time of 7 hours 19 min. The AHI is 2.5  The patient *** of limb movements disrupting sleep.  ROS  General: (-) fever, (-) chills, (-) night sweat Nose and Sinuses: (-) nasal stuffiness or itchiness, (-) postnasal drip, (-) nosebleeds, (-) sinus trouble. Mouth and Throat: (-) sore throat, (-) hoarseness. Neck: (-) swollen glands, (-) enlarged thyroid, (-) neck pain. Respiratory: *** cough, *** shortness of breath, *** wheezing. Neurologic: *** numbness, *** tingling. Psychiatric: *** anxiety, *** depression   Current Medication: Outpatient Encounter Medications as of 03/31/2023  Medication Sig Note   albuterol (VENTOLIN HFA) 108 (90 Base) MCG/ACT inhaler INHALE 2 PUFFS INTO THE LUNGS EVERY 6 HOURS AS NEEDED FOR WHEEZING OR SHORTNESS OF BREATH    amLODipine (NORVASC) 5 MG tablet TAKE 1 TABLET(5 MG) BY MOUTH DAILY    atenolol (TENORMIN) 50 MG tablet Take 1 tablet (50 mg total) by mouth daily.    atorvastatin (LIPITOR) 10 MG tablet TAKE 1 TABLET(10 MG) BY MOUTH DAILY    Blood Glucose Monitoring Suppl (CONTOUR NEXT MONITOR) w/Device KIT To check blood sugar 4 times daily.    Calcium Carbonate-Vitamin D (OYSTER SHELL CALCIUM 500 + D) 500-125 MG-UNIT TABS Take 1 tablet by mouth daily.      Cholecalciferol 1.25 MG (50000 UT) TABS Take 1 tablet by mouth once a week.    ferrous sulfate 325 (65 FE) MG tablet Take 325 mg by mouth daily with breakfast.    furosemide (LASIX) 40 MG tablet TAKE 1 TABLET(40 MG) BY MOUTH TWICE DAILY    glucose blood (CONTOUR NEXT TEST) test strip TEST BLOOD SUGAR ONCE DAILY    Insulin Glargine (BASAGLAR KWIKPEN) 100 UNIT/ML Inject 52 Units into the skin at bedtime.    Insulin Pen Needle 32G X 4 MM MISC 1 Units by Does not apply route every morning. Pen needles    JARDIANCE 25 MG TABS tablet TAKE 1 TABLET(25 MG) BY MOUTH DAILY    Lancets (ONETOUCH ULTRASOFT) lancets Use as instructed    levonorgestrel (MIRENA) 20 MCG/24HR IUD 1 each by Intrauterine route once.     losartan (COZAAR) 25 MG tablet Take 1 tablet (25 mg total) by mouth daily. OFFICE VISIT NEEDED FOR ADDITIONAL REFILLS    OXYGEN Inhale 2 L into the lungs daily. 10/01/2022: prn   tirzepatide (MOUNJARO) 15 MG/0.5ML Pen Inject 15 mg into the skin once a week.    No facility-administered encounter medications on file as of 03/31/2023.    Surgical History: Past Surgical History:  Procedure Laterality Date   CESAREAN SECTION     CHOLECYSTECTOMY     DILATION AND CURETTAGE, DIAGNOSTIC / THERAPEUTIC     ERCP N/A 06/27/2022   Procedure: ENDOSCOPIC RETROGRADE CHOLANGIOPANCREATOGRAPHY (  ERCP);  Surgeon: Midge Minium, MD;  Location: [facility] ENDOSCOPY;  Service: Endoscopy;  Laterality: N/A;   TONSILLECTOMY AND ADENOIDECTOMY     TOOTH EXTRACTION  01/2016    Medical History: Past Medical History:  Diagnosis Date   Abnormal uterine bleeding (AUB) 11/25/2016   Chronic diastolic CHF (congestive heart failure) (HCC)    a. 02/2012 Echo: EF 25-35%, glob HK; b. 06/2016 Echo: EF 55-60%, mild MR, nl PASP.   CKD (chronic kidney disease), stage II    COVID-19    Diabetes mellitus without complication (HCC)    Extreme obesity    Hyperlipidemia    Hypertension    Hypoxia    Microcytic anemia    Pneumonia     Pneumonia    Sleep apnea    Status post endometrial ablation 06/20/2016   2015 hysteroscopy/D&C with NovaSure endometrial ablation; pathology-benign endometrial polyps   Thickened endometrium 06/20/2016   Ultrasound-22.3 mm endometrial stripe, heterogenous   Vaginal bleeding    a. 01/2017 s/p hyteroscopy and polypectomy [facility]).   Vitamin D deficiency     Family History: Non contributory to the present illness  Social History: Social History   Socioeconomic History   Marital status: Single    Spouse name: Not on file   Number of children: 1   Years of education: GED   Highest education level: GED or equivalent  Occupational History   Occupation: disability  Tobacco Use   Smoking status: Never   Smokeless tobacco: Never  Vaping Use   Vaping status: Never Used  Substance and Sexual Activity   Alcohol use: Yes    Alcohol/week: 0.0 standard drinks of alcohol    Comment: 1-2 beers/month   Drug use: No   Sexual activity: Yes    Birth control/protection: I.U.D.  Other Topics Concern   Not on file  Social History Narrative   Never married, 1 son.  The patient recently lost an aunt in December 2020 and feels she got C19 related to the amount of family and friends coming in and out to pay respects to her aunt.    Social Drivers of Health   Financial Resource Strain: Medium Risk (03/09/2023)   Overall Financial Resource Strain (CARDIA)    Difficulty of Paying Living Expenses: Somewhat hard  Food Insecurity: No Food Insecurity (03/09/2023)   Hunger Vital Sign    Worried About Running Out of Food in the Last Year: Never true    Ran Out of Food in the Last Year: Never true  Transportation Needs: No Transportation Needs (03/09/2023)   PRAPARE - Administrator, Civil Service (Medical): No    Lack of Transportation (Non-Medical): No  Physical Activity: Insufficiently Active (03/09/2023)   Exercise Vital Sign    Days of Exercise per Week: 2 days    Minutes of Exercise per  Session: 10 min  Stress: No Stress Concern Present (03/09/2023)   [employer] of Occupational Health - Occupational Stress Questionnaire    Feeling of Stress : Only a little  Social Connections: Moderately Integrated (03/09/2023)   Social Connection and Isolation Panel [NHANES]    Frequency of Communication with Friends and Family: More than three times a week    Frequency of Social Gatherings with Friends and Family: Once a week    Attends Religious Services: More than 4 times per year    Active Member of [employer] or Organizations: No    Attends Banker Meetings: Never    Marital Status: Living with partner  Intimate Partner Violence: Not At Risk (10/01/2022)   Humiliation, Afraid, Rape, and Kick questionnaire    Fear of Current or Ex-Partner: No    Emotionally Abused: No    Physically Abused: No    Sexually Abused: No    Vital Signs: There were no vitals taken for this visit. There is no height or weight on file to calculate BMI.    Examination: General Appearance: The patient is well-developed, well-nourished, and in no distress. Neck Circumference: *** Skin: Gross inspection of skin unremarkable. Head: normocephalic, no gross deformities. Eyes: no gross deformities noted. ENT: ears appear grossly normal Neurologic: Alert and oriented. No involuntary movements.  STOP BANG RISK ASSESSMENT S (snore) Have you been told that you snore?     YES/N   T (tired) Are you often tired, fatigued, or sleepy during the day?   YES/NO  O (obstruction) Do you stop breathing, choke, or gasp during sleep? YES/NO   P (pressure) Do you have or are you being treated for high blood pressure? YES/NO   B (BMI) Is your body index greater than 35 kg/m? YES/NO   A (age) Are you 61 years old or older? YES   N (neck) Do you have a neck circumference greater than 16 inches?   YES/NO   G (gender) Are you a female? NO   TOTAL STOP/BANG "YES" ANSWERS        A STOP-Bang score of 2 or  less is considered low risk, and a score of 5 or more is high risk for having either moderate or severe OSA. For people who score 3 or 4, doctors may need to perform further assessment to determine how likely they are to have OSA.         EPWORTH SLEEPINESS SCALE:  Scale:  (0)= no chance of dozing; (1)= slight chance of dozing; (2)= moderate chance of dozing; (3)= high chance of dozing  Chance  Situtation    Sitting and reading: ***    Watching TV: ***    Sitting Inactive in public: ***    As a passenger in car: ***      Lying down to rest: ***    Sitting and talking: ***    Sitting quielty after lunch: ***    In a car, stopped in traffic: ***   TOTAL SCORE:   *** out of 24    SLEEP STUDIES:  SPLIT (05/10/13)  AHI 24.1, SPO2 82%, recommended Auto BIPAP and supplemental O2 @ 1.5 lpm    CPAP COMPLIANCE DATA:  Date Range: 03/29/22-03/28/2023  Average Daily Use: 7 hours 19 min  Median Use: 7 hr 20 min  Compliance for > 4 Hours: 90% days  AHI: 2.5 respiratory events per hour  Days Used: 335/365  Mask Leak: 81.1  95th Percentile Pressure: 18.4/13/4 PS 5         LABS: Recent Results (from the past 2160 hours)  Hemoglobin A1c     Status: None   Collection Time: 02/21/23 12:00 AM  Result Value Ref Range   A1c 7.2   Microalbumin, Urine Waived     Status: Abnormal   Collection Time: 03/12/23 10:18 AM  Result Value Ref Range   Microalb, Ur Waived 150 (H) 0 - 19 mg/L   Creatinine, Urine Waived 50 10 - 300 mg/dL   Microalb/Creat Ratio >300 (H) <30 mg/g    Comment:  Abnormal:       30 - 300                         High Abnormal:           >300   Comprehensive metabolic panel     Status: Abnormal   Collection Time: 03/12/23 10:19 AM  Result Value Ref Range   Glucose 128 (H) 70 - 99 mg/dL   BUN 16 6 - 24 mg/dL   Creatinine, Ser 1.61 (H) 0.57 - 1.00 mg/dL   eGFR 58 (L) >09 UE/AVW/0.98   BUN/Creatinine Ratio 14 9 - 23   Sodium  145 (H) 134 - 144 mmol/L   Potassium 4.6 3.5 - 5.2 mmol/L   Chloride 102 96 - 106 mmol/L   CO2 24 20 - 29 mmol/L   Calcium 7.9 (L) 8.7 - 10.2 mg/dL   Total Protein 6.9 6.0 - 8.5 g/dL   Albumin 3.9 3.8 - 4.9 g/dL   Globulin, Total 3.0 1.5 - 4.5 g/dL   Bilirubin Total 0.3 0.0 - 1.2 mg/dL   Alkaline Phosphatase 154 (H) 44 - 121 IU/L   AST 15 0 - 40 IU/L   ALT 10 0 - 32 IU/L  Lipid Panel w/o Chol/HDL Ratio     Status: None   Collection Time: 03/12/23 10:19 AM  Result Value Ref Range   Cholesterol, Total 121 100 - 199 mg/dL   Triglycerides 68 0 - 149 mg/dL   HDL 44 >11 mg/dL   VLDL Cholesterol Cal 14 5 - 40 mg/dL   LDL Chol Calc (NIH) 63 0 - 99 mg/dL  CBC with Differential/Platelet     Status: Abnormal   Collection Time: 03/12/23 10:19 AM  Result Value Ref Range   WBC 7.4 3.4 - 10.8 x10E3/uL   RBC 4.89 3.77 - 5.28 x10E6/uL   Hemoglobin 12.7 11.1 - 15.9 g/dL   Hematocrit 91.4 78.2 - 46.6 %   MCV 84 79 - 97 fL   MCH 26.0 (L) 26.6 - 33.0 pg   MCHC 31.0 (L) 31.5 - 35.7 g/dL   RDW 95.6 21.3 - 08.6 %   Platelets 346 150 - 450 x10E3/uL   Neutrophils 61 Not Estab. %   Lymphs 24 Not Estab. %   Monocytes 9 Not Estab. %   Eos 5 Not Estab. %   Basos 1 Not Estab. %   Neutrophils Absolute 4.5 1.4 - 7.0 x10E3/uL   Lymphocytes Absolute 1.8 0.7 - 3.1 x10E3/uL   Monocytes Absolute 0.7 0.1 - 0.9 x10E3/uL   EOS (ABSOLUTE) 0.4 0.0 - 0.4 x10E3/uL   Basophils Absolute 0.1 0.0 - 0.2 x10E3/uL   Immature Granulocytes 0 Not Estab. %   Immature Grans (Abs) 0.0 0.0 - 0.1 x10E3/uL  TSH     Status: None   Collection Time: 03/12/23 10:19 AM  Result Value Ref Range   TSH 2.350 0.450 - 4.500 uIU/mL  VITAMIN D 25 Hydroxy (Vit-D Deficiency, Fractures)     Status: Abnormal   Collection Time: 03/12/23 10:19 AM  Result Value Ref Range   Vit D, 25-Hydroxy 14.6 (L) 30.0 - 100.0 ng/mL    Comment: Vitamin D deficiency has been defined by the Institute of Medicine and an Endocrine Society practice guideline as  a level of serum 25-OH vitamin D less than 20 ng/mL (1,2). The Endocrine Society went on to further define vitamin D insufficiency as a level between 21 and 29 ng/mL (2). 1. IOM (  Institute of Medicine). 2010. Dietary reference    intakes for calcium and D. [place]: The    [employer]. 2. Holick MF, Binkley [place], Bischoff-Ferrari HA, et al.    Evaluation, treatment, and prevention of vitamin D    deficiency: an Endocrine Society clinical practice    guideline. JCEM. 2011 Jul; 96(7):1911-30.   Vitamin B12     Status: None   Collection Time: 03/12/23 10:19 AM  Result Value Ref Range   Vitamin B-12 562 232 - 1,245 pg/mL    Radiology: MM 3D SCREENING MAMMOGRAM BILATERAL BREAST Result Date: 07/22/2022 CLINICAL DATA:  Screening. EXAM: DIGITAL SCREENING BILATERAL MAMMOGRAM WITH TOMOSYNTHESIS AND CAD TECHNIQUE: Bilateral screening digital craniocaudal and mediolateral oblique mammograms were obtained. Bilateral screening digital breast tomosynthesis was performed. The images were evaluated with computer-aided detection. COMPARISON:  Previous exam(s). ACR Breast Density Category b: There are scattered areas of fibroglandular density. FINDINGS: There are no findings suspicious for malignancy. IMPRESSION: No mammographic evidence of malignancy. A result letter of this screening mammogram will be mailed directly to the patient. RECOMMENDATION: Screening mammogram in one year. (Code:SM-B-01Y) BI-RADS CATEGORY  1: Negative. Electronically Signed   By: Baird Lyons M.D.   On: 07/22/2022 16:36    No results found.  No results found.    Assessment and Plan: Patient Active Problem List   Diagnosis Date Noted   Common bile duct dilation 06/27/2022   Hypocalcemia 02/17/2022   B12 deficiency 03/27/2021   IUD (intrauterine device) in place 12/24/2020   Cough variant asthma vs Upper Airway cough syndrome 10/14/2020   CKD (chronic kidney disease) stage 3, GFR 30-59 ml/min (HCC) 06/06/2020    OSA on CPAP 03/27/2020   Proteinuria due to type 2 diabetes mellitus (HCC) 06/15/2019   Chronic heart failure with preserved ejection fraction (HCC) 10/22/2018   NICM (nonischemic cardiomyopathy) (HCC) 10/22/2018   BMI 50.0-59.9, adult (HCC) 11/18/2017   Type 2 diabetes mellitus treated with insulin (HCC) 04/25/2015   Hyperlipidemia associated with type 2 diabetes mellitus (HCC) 11/10/2014   Morbid obesity (HCC) 11/10/2014   Vitamin D deficiency 11/10/2014   Benign hypertensive heart and kidney disease with CHF and stage 2 chronic kidney disease (HCC) 11/10/2014      The patient *** tolerate PAP and reports *** benefit from PAP use. The patient was reminded how to *** and advised to ***. The patient was also counselled on ***. The compliance is ***. The AHI is ***.   ***  General Counseling: I have discussed the findings of the evaluation and examination with Misty Stanley.  I have also discussed any further diagnostic evaluation thatmay be needed or ordered today. Jaylyn verbalizes understanding of the findings of todays visit. We also reviewed her medications today and discussed drug interactions and side effects including but not limited excessive drowsiness and altered mental states. We also discussed that there is always a risk not just to her but also people around her. she has been encouraged to call the office with any questions or concerns that should arise related to todays visit.  No orders of the defined types were placed in this encounter.       I have personally obtained a history, examined the patient, evaluated laboratory and imaging results, formulated the assessment and plan and placed orders.  Yevonne Pax, MD [facility] [place] Diplomate ABMS Pulmonary Critical Care Medicine and Sleep Medicine

## 2023-03-31 ENCOUNTER — Ambulatory Visit: Payer: Medicare Other | Admitting: Internal Medicine

## 2023-04-30 ENCOUNTER — Encounter: Payer: Self-pay | Admitting: Podiatry

## 2023-04-30 ENCOUNTER — Ambulatory Visit (INDEPENDENT_AMBULATORY_CARE_PROVIDER_SITE_OTHER): Admitting: Podiatry

## 2023-04-30 DIAGNOSIS — B351 Tinea unguium: Secondary | ICD-10-CM | POA: Diagnosis not present

## 2023-04-30 DIAGNOSIS — M79676 Pain in unspecified toe(s): Secondary | ICD-10-CM | POA: Diagnosis not present

## 2023-04-30 DIAGNOSIS — E1142 Type 2 diabetes mellitus with diabetic polyneuropathy: Secondary | ICD-10-CM | POA: Diagnosis not present

## 2023-04-30 DIAGNOSIS — L600 Ingrowing nail: Secondary | ICD-10-CM

## 2023-04-30 MED ORDER — NEOMYCIN-POLYMYXIN-HC 1 % OT SOLN: OTIC | 1 refills | Status: DC

## 2023-04-30 NOTE — Progress Notes (Signed)
 Sara Zhang presents today chief complaint of painful elongated toenails and ingrown toenail to the tibial-fibular border of the hallux right.  States that her hemoglobin A1c is 7.7.  Objective: Vital signs are stable oriented x 3 pulses are palpable.  Neurologic and Sorum is intact per Semmes Weinstein monofilament deep tendon reflexes are intact.  Sharply rated nail margin along the tibial and fibular border of the hallux right hallux left this previously had a matrixectomy and is doing well.  Otherwise toenails are long thick yellow dystrophic Klay mycotic.  No open lesions or wounds.  Assessment: Ingrown nail to be in fibular border of the hallux right and diabetes without podiatric complications.  Plan: Discussed etiology pathology and surgical therapies debrided toenails 1 through 5 bilateral and perform chemical matricectomy to the tibial inferior border of the right hallux.  Was given both oral written home-going instructions for care and soaking of the toe as well as a prescription for Cortisporin Otic to be applied twice daily after soaking.  Follow-up with her in 2 to 3 weeks call sooner with questions or concerns.

## 2023-04-30 NOTE — Patient Instructions (Signed)

## 2023-05-09 ENCOUNTER — Other Ambulatory Visit: Payer: Self-pay | Admitting: Internal Medicine

## 2023-05-14 ENCOUNTER — Ambulatory Visit (INDEPENDENT_AMBULATORY_CARE_PROVIDER_SITE_OTHER): Admitting: Podiatry

## 2023-05-14 ENCOUNTER — Encounter: Payer: Self-pay | Admitting: Podiatry

## 2023-05-14 DIAGNOSIS — L600 Ingrowing nail: Secondary | ICD-10-CM

## 2023-05-14 DIAGNOSIS — Z9889 Other specified postprocedural states: Secondary | ICD-10-CM
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
## 2023-05-22 DIAGNOSIS — Z6841 Body Mass Index (BMI) 40.0 and over, adult: Secondary | ICD-10-CM | POA: Diagnosis not present

## 2023-05-22 DIAGNOSIS — E1159 Type 2 diabetes mellitus with other circulatory complications: Secondary | ICD-10-CM | POA: Diagnosis not present

## 2023-05-22 DIAGNOSIS — E1169 Type 2 diabetes mellitus with other specified complication: Secondary | ICD-10-CM | POA: Diagnosis not present

## 2023-05-22 DIAGNOSIS — R809 Proteinuria, unspecified: Secondary | ICD-10-CM | POA: Diagnosis not present

## 2023-05-22 DIAGNOSIS — E559 Vitamin D deficiency, unspecified: Secondary | ICD-10-CM | POA: Diagnosis not present

## 2023-05-22 DIAGNOSIS — I152 Hypertension secondary to endocrine disorders: Secondary | ICD-10-CM | POA: Diagnosis not present

## 2023-05-22 DIAGNOSIS — Z794 Long term (current) use of insulin: Secondary | ICD-10-CM | POA: Diagnosis not present

## 2023-05-22 DIAGNOSIS — E1129 Type 2 diabetes mellitus with other diabetic kidney complication: Secondary | ICD-10-CM | POA: Diagnosis not present

## 2023-05-22 DIAGNOSIS — E785 Hyperlipidemia, unspecified: Secondary | ICD-10-CM | POA: Diagnosis not present

## 2023-06-06 NOTE — Progress Notes (Signed)
 [facility] [address] [place], [place] [address]  Pulmonary Sleep Medicine   Office Visit Note  Patient Name: Sara Zhang DOB: 04-13-1970 MRN [id number]    Chief Complaint: Obstructive Sleep Apnea visit  Brief History:  Sara Zhang is seen today for an annual follow up visit for BiPAP auto@ IPAP max 25, EPAP min 12, PS 5 cmH2O. The patient has a 10 year history of sleep apnea. Patient is using PAP nightly.  The patient feels rested after sleeping with PAP.  The patient reports benefiting from PAP use. Reported sleepiness is  improved and the Epworth Sleepiness Score is 2 out of 24. The patient does not take naps. The patient complains of the following: pt is in need of a new water chamber and would like to try a different mask due to high leakage.  The compliance download shows 90% compliance with an average use time of 7 hours 19 minutes. The AHI is 2.8.  The patient does not complain of limb movements disrupting sleep. The patient continues to require PAP therapy in order to eliminate sleep apnea.   ROS  General: (-) fever, (-) chills, (-) night sweat Nose and Sinuses: (-) nasal stuffiness or itchiness, (-) postnasal drip, (-) nosebleeds, (-) sinus trouble. Mouth and Throat: (-) sore throat, (-) hoarseness. Neck: (-) swollen glands, (-) enlarged thyroid , (-) neck pain. Respiratory: + cough, - shortness of breath, - wheezing. Neurologic: - numbness, - tingling. Psychiatric: - anxiety, - depression   Current Medication: Outpatient Encounter Medications as of 06/09/2023  Medication Sig Note   albuterol  (VENTOLIN  HFA) 108 (90 Base) MCG/ACT inhaler INHALE 2 PUFFS INTO THE LUNGS EVERY 6 HOURS AS NEEDED FOR WHEEZING OR SHORTNESS OF BREATH    amLODipine  (NORVASC ) 5 MG tablet TAKE 1 TABLET(5 MG) BY MOUTH DAILY    atenolol  (TENORMIN ) 50 MG tablet Take 1 tablet (50 mg total) by mouth daily.    atorvastatin  (LIPITOR) 10 MG tablet TAKE 1 TABLET(10 MG) BY MOUTH DAILY    Blood  Glucose Monitoring Suppl (CONTOUR NEXT MONITOR) w/Device KIT To check blood sugar 4 times daily.    Calcium  Carbonate-Vitamin D  (OYSTER SHELL CALCIUM  500 + D) 500-125 MG-UNIT TABS Take 1 tablet by mouth daily.     Cholecalciferol  1.25 MG (50000 UT) TABS Take 1 tablet by mouth once a week.    ferrous sulfate  325 (65 FE) MG tablet Take 325 mg by mouth daily with breakfast.    furosemide  (LASIX ) 40 MG tablet TAKE 1 TABLET(40 MG) BY MOUTH TWICE DAILY    glucose blood (CONTOUR NEXT TEST) test strip TEST BLOOD SUGAR ONCE DAILY    Insulin  Glargine (BASAGLAR  KWIKPEN) 100 UNIT/ML Inject 52 Units into the skin at bedtime.    Insulin  Pen Needle 32G X 4 MM MISC 1 Units by Does not apply route every morning. Pen needles    JARDIANCE  25 MG TABS tablet TAKE 1 TABLET(25 MG) BY MOUTH DAILY    Lancets (ONETOUCH ULTRASOFT) lancets Use as instructed    levonorgestrel  (MIRENA ) 20 MCG/24HR IUD 1 each by Intrauterine route once.     losartan  (COZAAR ) 25 MG tablet Take 1 tablet (25 mg total) by mouth daily. OFFICE VISIT NEEDED FOR ADDITIONAL REFILLS    NEOMYCIN -POLYMYXIN-HYDROCORTISONE (CORTISPORIN) 1 % SOLN OTIC solution Apply 1-2 drops to toe BID after soaking    OXYGEN Inhale 2 L into the lungs daily. 10/01/2022: prn   tirzepatide (MOUNJARO) 15 MG/0.5ML Pen Inject 15 mg into the skin once a week.  No facility-administered encounter medications on file as of 06/09/2023.    Surgical History: Past Surgical History:  Procedure Laterality Date   CESAREAN SECTION     CHOLECYSTECTOMY     DILATION AND CURETTAGE, DIAGNOSTIC / THERAPEUTIC     ERCP N/A 06/27/2022   Procedure: ENDOSCOPIC RETROGRADE CHOLANGIOPANCREATOGRAPHY (ERCP);  Surgeon: Marnee Sink, MD;  Location: [facility] ENDOSCOPY;  Service: Endoscopy;  Laterality: N/A;   TONSILLECTOMY AND ADENOIDECTOMY     TOOTH EXTRACTION  01/2016    Medical History: Past Medical History:  Diagnosis Date   Abnormal uterine bleeding (AUB) 11/25/2016   Chronic diastolic CHF  (congestive heart failure) (HCC)    a. 02/2012 Echo: EF 25-35%, glob HK; b. 06/2016 Echo: EF 55-60%, mild MR, nl PASP.   CKD (chronic kidney disease), stage II    COVID-19    Diabetes mellitus without complication (HCC)    Extreme obesity    Hyperlipidemia    Hypertension    Hypoxia    Microcytic anemia    Pneumonia    Pneumonia    Sleep apnea    Status post endometrial ablation 06/20/2016   2015 hysteroscopy/D&C with NovaSure endometrial ablation; pathology-benign endometrial polyps   Thickened endometrium 06/20/2016   Ultrasound-22.3 mm endometrial stripe, heterogenous   Vaginal bleeding    a. 01/2017 s/p hyteroscopy and polypectomy [facility]).   Vitamin D  deficiency     Family History: Non contributory to the present illness  Social History: Social History   Socioeconomic History   Marital status: Single    Spouse name: Not on file   Number of children: 1   Years of education: GED   Highest education level: GED or equivalent  Occupational History   Occupation: disability  Tobacco Use   Smoking status: Never   Smokeless tobacco: Never  Vaping Use   Vaping status: Never Used  Substance and Sexual Activity   Alcohol use: Yes    Alcohol/week: 0.0 standard drinks of alcohol    Comment: 1-2 beers/month   Drug use: No   Sexual activity: Yes    Birth control/protection: I.U.D.  Other Topics Concern   Not on file  Social History Narrative   Never married, 1 son.  The patient recently lost an aunt in December 2020 and feels she got C19 related to the amount of family and friends coming in and out to pay respects to her aunt.    Social Drivers of Health   Financial Resource Strain: Medium Risk (03/09/2023)   Overall Financial Resource Strain (CARDIA)    Difficulty of Paying Living Expenses: Somewhat hard  Food Insecurity: No Food Insecurity (03/09/2023)   Hunger Vital Sign    Worried About Running Out of Food in the Last Year: Never true    Ran Out of Food in the Last Year:  Never true  Transportation Needs: No Transportation Needs (03/09/2023)   PRAPARE - Administrator, Civil Service (Medical): No    Lack of Transportation (Non-Medical): No  Physical Activity: Insufficiently Active (03/09/2023)   Exercise Vital Sign    Days of Exercise per Week: 2 days    Minutes of Exercise per Session: 10 min  Stress: No Stress Concern Present (03/09/2023)   [employer] of Occupational Health - Occupational Stress Questionnaire    Feeling of Stress : Only a little  Social Connections: Moderately Integrated (03/09/2023)   Social Connection and Isolation Panel [NHANES]    Frequency of Communication with Friends and Family: More than three times a week  Frequency of Social Gatherings with Friends and Family: Once a week    Attends Religious Services: More than 4 times per year    Active Member of [employer] or Organizations: No    Attends Banker Meetings: Never    Marital Status: Living with partner  Intimate Partner Violence: Not At Risk (10/01/2022)   Humiliation, Afraid, Rape, and Kick questionnaire    Fear of Current or Ex-Partner: No    Emotionally Abused: No    Physically Abused: No    Sexually Abused: No    Vital Signs: Blood pressure 135/89, pulse 80, resp. rate 16, height 5\' 9"  (1.753 m), weight (!) 331 lb (150.1 kg), SpO2 90%. Body mass index is 48.88 kg/m.    Examination: General Appearance: The patient is well-developed, well-nourished, and in no distress. Neck Circumference: 44 cm Skin: Gross inspection of skin unremarkable. Head: normocephalic, no gross deformities. Eyes: no gross deformities noted. ENT: ears appear grossly normal Neurologic: Alert and oriented. No involuntary movements.  STOP BANG RISK ASSESSMENT S (snore) Have you been told that you snore?     NO   T (tired) Are you often tired, fatigued, or sleepy during the day?   NO  O (obstruction) Do you stop breathing, choke, or gasp during sleep? NO   P  (pressure) Do you have or are you being treated for high blood pressure? YES   B (BMI) Is your body index greater than 35 kg/m? YES   A (age) Are you 33 years old or older? YES   N (neck) Do you have a neck circumference greater than 16 inches?   YES   G (gender) Are you a female? NO   TOTAL STOP/BANG "YES" ANSWERS 4       A STOP-Bang score of 2 or less is considered low risk, and a score of 5 or more is high risk for having either moderate or severe OSA. For people who score 3 or 4, doctors may need to perform further assessment to determine how likely they are to have OSA.         EPWORTH SLEEPINESS SCALE:  Scale:  (0)= no chance of dozing; (1)= slight chance of dozing; (2)= moderate chance of dozing; (3)= high chance of dozing  Chance  Situtation    Sitting and reading: 0    Watching TV: 0    Sitting Inactive in public: 0    As a passenger in car: 1      Lying down to rest: 1    Sitting and talking: 0    Sitting quielty after lunch: 0    In a car, stopped in traffic: 0   TOTAL SCORE:   2 out of 24    SLEEP STUDIES:  Split Study (05/2013) AHI 24.1/hr, min SpO2 82%, recommended BiPAP auto@ IPAP max 25, EPAP min 12, PS 5 cmH2O and 1.5 Lpm of supplemental O2.   CPAP COMPLIANCE DATA:  Date Range: 06/05/2022-06/04/2023  Average Daily Use: 7 hours 19 minutes  Median Use: 7 hours 25 minutes  Compliance for > 4 Hours: 90%  AHI: 2.8 respiratory events per hour  Days Used: 334/365 days  Mask Leak: 77.8  95th Percentile Pressure: 18.4/13.4         LABS: Recent Results (from the past 2160 hours)  Microalbumin, Urine Waived     Status: Abnormal   Collection Time: 03/12/23 10:18 AM  Result Value Ref Range   Microalb, Ur Waived 150 (H) 0 - 19 mg/L  Creatinine, Urine Waived 50 10 - 300 mg/dL   Microalb/Creat Ratio >300 (H) <30 mg/g    Comment:                              Abnormal:       30 - 300                         High Abnormal:            >300   Comprehensive metabolic panel     Status: Abnormal   Collection Time: 03/12/23 10:19 AM  Result Value Ref Range   Glucose 128 (H) 70 - 99 mg/dL   BUN 16 6 - 24 mg/dL   Creatinine, Ser 1.61 (H) 0.57 - 1.00 mg/dL   eGFR 58 (L) >09 UE/AVW/0.98   BUN/Creatinine Ratio 14 9 - 23   Sodium 145 (H) 134 - 144 mmol/L   Potassium 4.6 3.5 - 5.2 mmol/L   Chloride 102 96 - 106 mmol/L   CO2 24 20 - 29 mmol/L   Calcium  7.9 (L) 8.7 - 10.2 mg/dL   Total Protein 6.9 6.0 - 8.5 g/dL   Albumin 3.9 3.8 - 4.9 g/dL   Globulin, Total 3.0 1.5 - 4.5 g/dL   Bilirubin Total 0.3 0.0 - 1.2 mg/dL   Alkaline Phosphatase 154 (H) 44 - 121 IU/L   AST 15 0 - 40 IU/L   ALT 10 0 - 32 IU/L  Lipid Panel w/o Chol/HDL Ratio     Status: None   Collection Time: 03/12/23 10:19 AM  Result Value Ref Range   Cholesterol, Total 121 100 - 199 mg/dL   Triglycerides 68 0 - 149 mg/dL   HDL 44 >11 mg/dL   VLDL Cholesterol Cal 14 5 - 40 mg/dL   LDL Chol Calc (NIH) 63 0 - 99 mg/dL  CBC with Differential/Platelet     Status: Abnormal   Collection Time: 03/12/23 10:19 AM  Result Value Ref Range   WBC 7.4 3.4 - 10.8 x10E3/uL   RBC 4.89 3.77 - 5.28 x10E6/uL   Hemoglobin 12.7 11.1 - 15.9 g/dL   Hematocrit 91.4 78.2 - 46.6 %   MCV 84 79 - 97 fL   MCH 26.0 (L) 26.6 - 33.0 pg   MCHC 31.0 (L) 31.5 - 35.7 g/dL   RDW 95.6 21.3 - 08.6 %   Platelets 346 150 - 450 x10E3/uL   Neutrophils 61 Not Estab. %   Lymphs 24 Not Estab. %   Monocytes 9 Not Estab. %   Eos 5 Not Estab. %   Basos 1 Not Estab. %   Neutrophils Absolute 4.5 1.4 - 7.0 x10E3/uL   Lymphocytes Absolute 1.8 0.7 - 3.1 x10E3/uL   Monocytes Absolute 0.7 0.1 - 0.9 x10E3/uL   EOS (ABSOLUTE) 0.4 0.0 - 0.4 x10E3/uL   Basophils Absolute 0.1 0.0 - 0.2 x10E3/uL   Immature Granulocytes 0 Not Estab. %   Immature Grans (Abs) 0.0 0.0 - 0.1 x10E3/uL  TSH     Status: None   Collection Time: 03/12/23 10:19 AM  Result Value Ref Range   TSH 2.350 0.450 - 4.500 uIU/mL  VITAMIN D  25  Hydroxy (Vit-D Deficiency, Fractures)     Status: Abnormal   Collection Time: 03/12/23 10:19 AM  Result Value Ref Range   Vit D, 25-Hydroxy 14.6 (L) 30.0 - 100.0 ng/mL    Comment: Vitamin D  deficiency  has been defined by the Institute of Medicine and an Endocrine Society practice guideline as a level of serum 25-OH vitamin D  less than 20 ng/mL (1,2). The Endocrine Society went on to further define vitamin D  insufficiency as a level between 21 and 29 ng/mL (2). 1. IOM (Institute of Medicine). 2010. Dietary reference    intakes for calcium  and D. [place]: The    [employer]. 2. Holick MF, Binkley Heflin, Bischoff-Ferrari HA, et al.    Evaluation, treatment, and prevention of vitamin D     deficiency: an Endocrine Society clinical practice    guideline. JCEM. 2011 Jul; 96(7):1911-30.   Vitamin B12     Status: None   Collection Time: 03/12/23 10:19 AM  Result Value Ref Range   Vitamin B-12 562 232 - 1,245 pg/mL    Radiology: MM 3D SCREENING MAMMOGRAM BILATERAL BREAST Result Date: 07/22/2022 CLINICAL DATA:  Screening. EXAM: DIGITAL SCREENING BILATERAL MAMMOGRAM WITH TOMOSYNTHESIS AND CAD TECHNIQUE: Bilateral screening digital craniocaudal and mediolateral oblique mammograms were obtained. Bilateral screening digital breast tomosynthesis was performed. The images were evaluated with computer-aided detection. COMPARISON:  Previous exam(s). ACR Breast Density Category b: There are scattered areas of fibroglandular density. FINDINGS: There are no findings suspicious for malignancy. IMPRESSION: No mammographic evidence of malignancy. A result letter of this screening mammogram will be mailed directly to the patient. RECOMMENDATION: Screening mammogram in one year. (Code:SM-B-01Y) BI-RADS CATEGORY  1: Negative. Electronically Signed   By: Dina  Arceo M.D.   On: 07/22/2022 16:36    No results found.  No results found.    Assessment and Plan: Patient Active Problem List    Diagnosis Date Noted   Common bile duct dilation 06/27/2022   Hypocalcemia 02/17/2022   B12 deficiency 03/27/2021   IUD (intrauterine device) in place 12/24/2020   Cough variant asthma vs Upper Airway cough syndrome 10/14/2020   CKD (chronic kidney disease) stage 3, GFR 30-59 ml/min (HCC) 06/06/2020   OSA on CPAP 03/27/2020   Proteinuria due to type 2 diabetes mellitus (HCC) 06/15/2019   Chronic heart failure with preserved ejection fraction (HCC) 10/22/2018   NICM (nonischemic cardiomyopathy) (HCC) 10/22/2018   BMI 50.0-59.9, adult (HCC) 11/18/2017   Type 2 diabetes mellitus treated with insulin  (HCC) 04/25/2015   Hyperlipidemia associated with type 2 diabetes mellitus (HCC) 11/10/2014   Morbid obesity (HCC) 11/10/2014   Vitamin D  deficiency 11/10/2014   Benign hypertensive heart and kidney disease with CHF and stage 2 chronic kidney disease (HCC) 11/10/2014   1. OSA treated with BiPAP (Primary) The patient does tolerate PAP and reports  benefit from PAP use. She was sick recently and unable to use her bipap but has felt better the last few days and plans on resuming use. The patient was reminded how to clean equipment and advised to replace supplies routinely. She has high mask leak and a mask fitting will be scheduled. . The patient was also counselled on weight loss. She is on tirzepatide and has lost weight. I explained that it may also be helping her apnea. . The compliance is very good. The AHI is 2.8.   OSA treated with bipap, controlled. Resume consistent use. Mask fitting. F/u one year. Bipap continues to be medically necessary to control this patient's OSA.   2. Encounter for BiPAP use counseling CPAP Counseling: had a lengthy discussion with the patient regarding the importance of PAP therapy in management of the sleep apnea. Patient appears to understand the risk factor reduction and also understands  the risks associated with untreated sleep apnea. Patient will try to make a  good faith effort to remain compliant with therapy. Also instructed the patient on proper cleaning of the device including the water must be changed daily if possible and use of distilled water is preferred. Patient understands that the machine should be regularly cleaned with appropriate recommended cleaning solutions that do not damage the PAP machine for example given white vinegar and water rinses. Other methods such as ozone treatment may not be as good as these simple methods to achieve cleaning.   3. Morbid obesity (HCC) Obesity Counseling: Had a lengthy discussion regarding patients BMI and weight issues. Patient was instructed on portion control as well as increased activity. Also discussed caloric restrictions with trying to maintain intake less than 2000 Kcal. Discussions were made in accordance with the 5As of weight management. Simple actions such as not eating late and if able to, taking a walk is suggested.     General Counseling: I have discussed the findings of the evaluation and examination with Edwina Gram.  I have also discussed any further diagnostic evaluation thatmay be needed or ordered today. Yolinda verbalizes understanding of the findings of todays visit. We also reviewed her medications today and discussed drug interactions and side effects including but not limited excessive drowsiness and altered mental states. We also discussed that there is always a risk not just to her but also people around her. she has been encouraged to call the office with any questions or concerns that should arise related to todays visit.  No orders of the defined types were placed in this encounter.       I have personally obtained a history, examined the patient, evaluated laboratory and imaging results, formulated the assessment and plan and placed orders. This patient was seen today by Louann Rous, PA-C in collaboration with Dr. Cam Cava.   Cordie Deters, MD [facility] Diplomate ABMS Pulmonary  Critical Care Medicine and Sleep Medicine

## 2023-06-08 ENCOUNTER — Encounter: Payer: Self-pay | Admitting: Nurse Practitioner

## 2023-06-09 ENCOUNTER — Ambulatory Visit (INDEPENDENT_AMBULATORY_CARE_PROVIDER_SITE_OTHER): Admitting: Internal Medicine

## 2023-06-09 VITALS — BP 135/89 | HR 80 | Resp 16 | Ht 69.0 in | Wt 331.0 lb

## 2023-06-09 DIAGNOSIS — G4733 Obstructive sleep apnea (adult) (pediatric): Secondary | ICD-10-CM | POA: Insufficient documentation

## 2023-06-09 DIAGNOSIS — Z7189 Other specified counseling: Secondary | ICD-10-CM | POA: Insufficient documentation

## 2023-06-09 NOTE — Patient Instructions (Signed)

## 2023-06-11 ENCOUNTER — Ambulatory Visit: Admitting: Nurse Practitioner

## 2023-06-11 NOTE — Patient Instructions (Signed)
Be Involved in Caring For Your Health:  Taking Medications When medications are taken as directed, they can greatly improve your health. But if they are not taken as prescribed, they may not work. In some cases, not taking them correctly can be harmful. To help ensure your treatment remains effective and safe, understand your medications and how to take them. Bring your medications to each visit for review by your provider.  Your lab results, notes, and after visit summary will be available on My Chart. We strongly encourage you to use this feature. If lab results are abnormal the clinic will contact you with the appropriate steps. If the clinic does not contact you assume the results are satisfactory. You can always view your results on My Chart. If you have questions regarding your health or results, please contact the clinic during office hours. You can also ask questions on My Chart.  We at [facility] are grateful that you chose [place] to provide your care. We strive to provide evidence-based and compassionate care and are always looking for feedback. If you get a survey from the clinic please complete this so we can hear your opinions.  Diabetes Mellitus and Exercise Regular exercise is important for your health, especially if you have diabetes mellitus. Exercise is not just about losing weight. It can also help you increase muscle strength and bone density and reduce body fat and stress. This can help your level of endurance and make you more fit and flexible. Why should I exercise if I have diabetes? Exercise has many benefits for people with diabetes. It can: Help lower and control your blood sugar (glucose). Help your body respond better and become more sensitive to the hormone insulin. Reduce how much insulin your body needs. Lower your risk for heart disease by: Lowering how much "bad" cholesterol and triglycerides you have in your body. Increasing how much "good" cholesterol  you have in your body. Lowering your blood pressure. Lowering your blood glucose levels. What is my activity plan? Your health care provider or an expert trained in diabetes care (certified diabetes educator) can help you make an activity plan. This plan can help you find the type of exercise that works for you. It may also tell you how often to exercise and for how long. Be sure to: Get at least 150 minutes of medium-intensity or high-intensity exercise each week. This may involve brisk walking, biking, or water aerobics. Do stretching and strengthening exercises at least 2 times a week. This may involve yoga or weight lifting. Spread out your activity over at least 3 days of the week. Get some form of physical activity each day. Do not go more than 2 days in a row without some kind of activity. Avoid being inactive for more than 30 minutes at a time. Take frequent breaks to walk or stretch. Choose activities that you enjoy. Set goals that you know you can accomplish. Start slowly and increase the intensity of your exercise over time. How do I manage my diabetes during exercise?  Monitor your blood glucose Check your blood glucose before and after you exercise. If your blood glucose is 240 mg/dL (40.9 mmol/L) or higher before you exercise, check your urine for ketones. These are chemicals created by the liver. If you have ketones in your urine, do not exercise until your blood glucose returns to normal. If your blood glucose is 100 mg/dL (5.6 mmol/L) or lower, eat a snack that has 15-20 grams of carbohydrate in  it. Check your blood glucose 15 minutes after the snack to make sure that your level is above 100 mg/dL (5.6 mmol/L) before you start to exercise. Your risk for low blood glucose (hypoglycemia) goes up during and after exercise. Know the symptoms of this condition and how to treat it. Follow these instructions at home: Keep a carbohydrate snack on hand for use before, during, and after  exercise. This can help prevent or treat hypoglycemia. Avoid injecting insulin into parts of your body that are going to be used during exercise. This may include: Your arms, when you are going to play tennis. Your legs, when you are about to go jogging. Keep track of your exercise habits. This can help you and your health care provider watch and adjust your activity plan. Write down: What you eat before and after you exercise. Blood glucose levels before and after you exercise. The type and amount of exercise you do. Talk to your health care provider before you start a new activity. They may need to: Make sure that the activity is safe for you. Adjust your insulin, other medicines, and food that you eat. Drink water while you exercise. This can stop you from losing too much water (dehydration). It can also prevent problems caused by having a lot of heat in your body (heat stroke). Where to find more information American Diabetes Association: diabetes.org Association of Diabetes Care & Education Specialists: diabeteseducator.org This information is not intended to replace advice given to you by your health care provider. Make sure you discuss any questions you have with your health care provider. Document Revised: 07/11/2021 Document Reviewed: 07/11/2021 Elsevier Patient Education  2024 [employer].

## 2023-06-12 ENCOUNTER — Encounter: Payer: Self-pay | Admitting: Nurse Practitioner

## 2023-06-12 ENCOUNTER — Ambulatory Visit (INDEPENDENT_AMBULATORY_CARE_PROVIDER_SITE_OTHER): Admitting: Nurse Practitioner

## 2023-06-12 VITALS — BP 136/78 | HR 86 | Temp 98.6°F | Ht 69.0 in | Wt 331.2 lb

## 2023-06-12 DIAGNOSIS — G4733 Obstructive sleep apnea (adult) (pediatric): Secondary | ICD-10-CM | POA: Diagnosis not present

## 2023-06-12 DIAGNOSIS — E119 Type 2 diabetes mellitus without complications: Secondary | ICD-10-CM

## 2023-06-12 DIAGNOSIS — E1169 Type 2 diabetes mellitus with other specified complication: Secondary | ICD-10-CM

## 2023-06-12 DIAGNOSIS — E1129 Type 2 diabetes mellitus with other diabetic kidney complication: Secondary | ICD-10-CM

## 2023-06-12 DIAGNOSIS — I130 Hypertensive heart and chronic kidney disease with heart failure and stage 1 through stage 4 chronic kidney disease, or unspecified chronic kidney disease: Secondary | ICD-10-CM

## 2023-06-12 DIAGNOSIS — I5032 Chronic diastolic (congestive) heart failure: Secondary | ICD-10-CM

## 2023-06-12 DIAGNOSIS — Z794 Long term (current) use of insulin: Secondary | ICD-10-CM | POA: Diagnosis not present

## 2023-06-12 DIAGNOSIS — J45991 Cough variant asthma: Secondary | ICD-10-CM | POA: Diagnosis not present

## 2023-06-12 DIAGNOSIS — N1831 Chronic kidney disease, stage 3a: Secondary | ICD-10-CM

## 2023-06-12 DIAGNOSIS — I428 Other cardiomyopathies: Secondary | ICD-10-CM

## 2023-06-12 DIAGNOSIS — Z6841 Body Mass Index (BMI) 40.0 and over, adult: Secondary | ICD-10-CM

## 2023-06-12 DIAGNOSIS — E785 Hyperlipidemia, unspecified: Secondary | ICD-10-CM | POA: Diagnosis not present

## 2023-06-12 DIAGNOSIS — Z1211 Encounter for screening for malignant neoplasm of colon: Secondary | ICD-10-CM

## 2023-06-12 MED ORDER — PREDNISONE 20 MG PO TABS: 40.0000 mg | ORAL_TABLET | Freq: Every day | ORAL | 0 refills | Status: AC

## 2023-06-12 MED ORDER — ALBUTEROL SULFATE HFA 108 (90 BASE) MCG/ACT IN AERS: 2.0000 | INHALATION_SPRAY | Freq: Four times a day (QID) | RESPIRATORY_TRACT | 2 refills | Status: AC | PRN

## 2023-06-12 MED ORDER — AMOXICILLIN-POT CLAVULANATE 875-125 MG PO TABS: 1.0000 | ORAL_TABLET | Freq: Two times a day (BID) | ORAL | 0 refills | Status: AC

## 2023-06-12 MED ORDER — HYDROCOD POLI-CHLORPHE POLI ER 10-8 MG/5ML PO SUER: 5.0000 mL | Freq: Every evening | ORAL | 0 refills | Status: DC | PRN

## 2023-06-12 NOTE — Assessment & Plan Note (Signed)
Chronic, ongoing.  Continue current medication regimen as is at goal.  Lipid panel today. 

## 2023-06-12 NOTE — Assessment & Plan Note (Signed)
Chronic, ongoing.  Continue to monitor closely.  Maintain ARB on board for protection.  Avoid Ibuprofen products.  Refer to nephrology as needed and renal dose medications as needed.  Educated patient on good hydration at home.

## 2023-06-12 NOTE — Assessment & Plan Note (Signed)
Chronic, ongoing.  Continue collaboration with cardiology and current medication regimen as prescribed by them.  Recent notes reviewed.

## 2023-06-12 NOTE — Assessment & Plan Note (Signed)
 BMI 48.91 with diabetes, HTN, HF.  Recommended eating smaller high protein, low fat meals more frequently and exercising 30 mins a day 5 times a week with a goal of 10-15lb weight loss in the next 3 months. Patient voiced their understanding and motivation to adhere to these recommendations.

## 2023-06-12 NOTE — Assessment & Plan Note (Signed)
 Chronic, ongoing.  Followed by endo.  A1c 7.4% recently with endo. Urine ALB  13 February 2022, recheck today. Continue collaboration with endocrinology, recent notes reviewed and labs. - Continue current medication regimen as ordered by them.  Ensure to attend visits with endo consistently.   - Recommend Sara Zhang continue to monitor BS at home 2-3 times a day.   - Ensure healthy diet choices at home, reduce candy. - Foot and eye exams up to date. - Vaccinations -- refuses - ARB and statin on board

## 2023-06-12 NOTE — Assessment & Plan Note (Signed)
 Chronic, ongoing.  Followed by endo.  A1c 7.4% recently with endo. Urine ALB  13 February 2022, recheck today. Continue collaboration with endocrinology, recent notes reviewed and labs. - Continue current medication regimen as ordered by them.  Ensure to attend visits with endo consistently.   - Recommend she continue to monitor BS at home 2-3 times a day.   - Ensure healthy diet choices at home, reduce candy. - Foot and eye exams up to date. - Vaccinations -- refuses - ARB and statin on board

## 2023-06-12 NOTE — Progress Notes (Signed)
 BP 136/78 (BP Location: Left Arm, Patient Position: Sitting, Cuff Size: Large)   Pulse 86   Temp 98.6 F (37 C) (Oral)   Ht 5\' 9"  (1.753 m)   Wt (!) 331 lb 3.2 oz (150.2 kg)   SpO2 93%   BMI 48.91 kg/m    Subjective:    Patient ID: Manning Seen, female    DOB: May 18, 1970, 53 y.o.   MRN: [id number]  HPI: JANKI MATULICH is a 53 y.o. female  Chief Complaint  Patient presents with   Asthma   Chronic Kidney Disease   Diabetes   Hyperlipidemia   Hypertension   URI    Patient states she has been having a cough, congestion, and sinus pressure for the last 11 days.    DIABETES A1c in April 7.4% with endo.  Continues Jardiance  25 MG, Basaglar  52 units QHS, and Mounjaro 12.5 MG weekly.  Last saw endocrinology 05/22/23 and they increased Mounjaro. Has lost 16 pounds with Mounjaro.  Continues on Vitamin D  and B12 supplements daily. Polydipsia/polyuria: no Visual disturbance: no Chest pain: no Paresthesias: no Glucose Monitoring: yes             Accucheck frequency: BID             Fasting glucose: <120 on average             Evening: highest has been 256, on average <180             Before meals: Taking Insulin ?: yes             Long acting insulin : 48 units             Short acting insulin : Blood Pressure Monitoring: not checking Retinal Examination: Up to Date -- [facility], Dr. Ignatius Makos Foot Exam: Up to Date Pneumovax: Up to Date Influenza: Not Up to Date -- refuses Aspirin: no    HYPERTENSION / HYPERLIPIDEMIA/HF Continues Losartan  (Lisinopril  caused cough), Furosemide , Amlodipine , Atenolol  + Lipitor for HLD.  Cardiology last seen 03/29/22, is scheduled to return in July this year.  Last echo in 2020 October was 55-60%. Satisfied with current treatment? yes Duration of hypertension: chronic BP monitoring frequency: not checking BP range:  BP medication side effects: no Duration of hyperlipidemia: chronic Cholesterol medication side effects: no Cholesterol  supplements: none Medication compliance: good compliance Aspirin: no Recent stressors: no Recurrent headaches: no Visual changes: no Palpitations: no Dyspnea: no Chest pain: no Lower extremity edema: a little bit because has been sitting up due to URI Dizzy/lightheaded: no The ASCVD Risk score (Arnett DK, et al., 2019) failed to calculate for the following reasons:   The valid total cholesterol range is 130 to 320 mg/dL  CHRONIC KIDNEY DISEASE CKD status: stable Medications renally dose: yes Previous renal evaluation: no Pneumovax:  Up to Date Influenza Vaccine:  Refuses    ASTHMA Continues to use O2 every night due to OSA and CPAP.  Last saw sleep provider on 03/25/22.  No current inhaler regimen.  Currently has been sick for 11 days with coughing and congestion. Has taken Alka Seltzer Plus Cold, Delsym , Chloraseptic throat lozenges. Asthma status: stable Satisfied with current treatment?: yes Albuterol /rescue inhaler frequency: none Dyspnea frequency: no Wheezing frequency: no Cough frequency: no Nocturnal symptom frequency: no Limitation of activity: no Current upper respiratory symptoms: no Triggers: enviromental Aerochamber/spacer use: no Visits to ER or Urgent Care in past year: no Pneumovax: Up to Date Influenza:  refuses  Relevant past medical, surgical, family and social history reviewed and updated as indicated. Interim medical history since our last visit reviewed. Allergies and medications reviewed and updated.  Review of Systems  Constitutional:  Positive for fatigue. Negative for activity change, appetite change, diaphoresis and fever.  HENT:  Positive for congestion, postnasal drip, rhinorrhea, sinus pressure and voice change (hoarseness). Negative for ear discharge, ear pain, sinus pain and sore throat.   Respiratory:  Positive for cough, shortness of breath and wheezing. Negative for chest tightness.   Cardiovascular:  Negative for chest pain,  palpitations and leg swelling.  Gastrointestinal: Negative.   Endocrine: Negative for cold intolerance, heat intolerance, polydipsia, polyphagia and polyuria.  Neurological: Negative.   Psychiatric/Behavioral: Negative.      Per HPI unless specifically indicated above     Objective:    BP 136/78 (BP Location: Left Arm, Patient Position: Sitting, Cuff Size: Large)   Pulse 86   Temp 98.6 F (37 C) (Oral)   Ht 5\' 9"  (1.753 m)   Wt (!) 331 lb 3.2 oz (150.2 kg)   SpO2 93%   BMI 48.91 kg/m   Wt Readings from Last 3 Encounters:  06/12/23 (!) 331 lb 3.2 oz (150.2 kg)  06/09/23 (!) 331 lb (150.1 kg)  03/12/23 (!) 347 lb 3.2 oz (157.5 kg)    Physical Exam Vitals and nursing note reviewed.  Constitutional:      General: She is awake. She is not in acute distress.    Appearance: She is well-developed and well-groomed. She is obese. She is not ill-appearing or toxic-appearing.  HENT:     Head: Normocephalic.     Right Ear: Hearing, ear canal and external ear normal. A middle ear effusion is present. There is no impacted cerumen. Tympanic membrane is not injected.     Left Ear: Hearing, ear canal and external ear normal. A middle ear effusion is present. There is no impacted cerumen. Tympanic membrane is not injected.     Nose: Rhinorrhea present. Rhinorrhea is clear.     Right Sinus: No maxillary sinus tenderness or frontal sinus tenderness.     Left Sinus: No maxillary sinus tenderness or frontal sinus tenderness.     Mouth/Throat:     Mouth: Mucous membranes are moist.     Pharynx: Posterior oropharyngeal erythema (mild) present. No pharyngeal swelling or oropharyngeal exudate.  Eyes:     General: Lids are normal. No scleral icterus.       Right eye: No discharge.        Left eye: No discharge.     Conjunctiva/sclera: Conjunctivae normal.     Pupils: Pupils are equal, round, and reactive to light.  Neck:     Thyroid : No thyromegaly.     Vascular: No carotid bruit.   Cardiovascular:     Rate and Rhythm: Normal rate and regular rhythm.     Heart sounds: Normal heart sounds. No murmur heard.    No gallop.  Pulmonary:     Effort: Pulmonary effort is normal. No accessory muscle usage or respiratory distress.     Breath sounds: Wheezing present. No decreased breath sounds or rales.     Comments: Scattered expiratory wheezes noted throughout, no rales. Intermittent hacking cough present. Abdominal:     General: Abdomen is protuberant. Bowel sounds are normal. There is no distension.     Palpations: Abdomen is soft.     Tenderness: There is no abdominal tenderness.  Musculoskeletal:     Cervical back: Normal  range of motion and neck supple.     Right lower leg: Edema (trace) present.     Left lower leg: Edema (trace) present.  Lymphadenopathy:     Cervical: No cervical adenopathy.  Skin:    General: Skin is warm and dry.     Capillary Refill: Capillary refill takes less than 2 seconds.  Neurological:     Mental Status: She is alert and oriented to person, place, and time.  Psychiatric:        Attention and Perception: Attention normal.        Mood and Affect: Mood and affect normal.        Speech: Speech normal.        Behavior: Behavior normal. Behavior is cooperative.        Thought Content: Thought content normal.    Results for orders placed or performed in visit on 03/12/23  Hemoglobin A1c   Collection Time: 02/21/23 12:00 AM  Result Value Ref Range   A1c 7.2   Microalbumin, Urine Waived   Collection Time: 03/12/23 10:18 AM  Result Value Ref Range   Microalb, Ur Waived 150 (H) 0 - 19 mg/L   Creatinine, Urine Waived 50 10 - 300 mg/dL   Microalb/Creat Ratio >300 (H) <30 mg/g  Comprehensive metabolic panel   Collection Time: 03/12/23 10:19 AM  Result Value Ref Range   Glucose 128 (H) 70 - 99 mg/dL   BUN 16 6 - 24 mg/dL   Creatinine, Ser 1.61 (H) 0.57 - 1.00 mg/dL   eGFR 58 (L) >09 UE/AVW/0.98   BUN/Creatinine Ratio 14 9 - 23    Sodium 145 (H) 134 - 144 mmol/L   Potassium 4.6 3.5 - 5.2 mmol/L   Chloride 102 96 - 106 mmol/L   CO2 24 20 - 29 mmol/L   Calcium  7.9 (L) 8.7 - 10.2 mg/dL   Total Protein 6.9 6.0 - 8.5 g/dL   Albumin 3.9 3.8 - 4.9 g/dL   Globulin, Total 3.0 1.5 - 4.5 g/dL   Bilirubin Total 0.3 0.0 - 1.2 mg/dL   Alkaline Phosphatase 154 (H) 44 - 121 IU/L   AST 15 0 - 40 IU/L   ALT 10 0 - 32 IU/L  Lipid Panel w/o Chol/HDL Ratio   Collection Time: 03/12/23 10:19 AM  Result Value Ref Range   Cholesterol, Total 121 100 - 199 mg/dL   Triglycerides 68 0 - 149 mg/dL   HDL 44 >11 mg/dL   VLDL Cholesterol Cal 14 5 - 40 mg/dL   LDL Chol Calc (NIH) 63 0 - 99 mg/dL  CBC with Differential/Platelet   Collection Time: 03/12/23 10:19 AM  Result Value Ref Range   WBC 7.4 3.4 - 10.8 x10E3/uL   RBC 4.89 3.77 - 5.28 x10E6/uL   Hemoglobin 12.7 11.1 - 15.9 g/dL   Hematocrit 91.4 78.2 - 46.6 %   MCV 84 79 - 97 fL   MCH 26.0 (L) 26.6 - 33.0 pg   MCHC 31.0 (L) 31.5 - 35.7 g/dL   RDW 95.6 21.3 - 08.6 %   Platelets 346 150 - 450 x10E3/uL   Neutrophils 61 Not Estab. %   Lymphs 24 Not Estab. %   Monocytes 9 Not Estab. %   Eos 5 Not Estab. %   Basos 1 Not Estab. %   Neutrophils Absolute 4.5 1.4 - 7.0 x10E3/uL   Lymphocytes Absolute 1.8 0.7 - 3.1 x10E3/uL   Monocytes Absolute 0.7 0.1 - 0.9 x10E3/uL   EOS (ABSOLUTE) 0.4  0.0 - 0.4 x10E3/uL   Basophils Absolute 0.1 0.0 - 0.2 x10E3/uL   Immature Granulocytes 0 Not Estab. %   Immature Grans (Abs) 0.0 0.0 - 0.1 x10E3/uL  TSH   Collection Time: 03/12/23 10:19 AM  Result Value Ref Range   TSH 2.350 0.450 - 4.500 uIU/mL  VITAMIN D  25 Hydroxy (Vit-D Deficiency, Fractures)   Collection Time: 03/12/23 10:19 AM  Result Value Ref Range   Vit D, 25-Hydroxy 14.6 (L) 30.0 - 100.0 ng/mL  Vitamin B12   Collection Time: 03/12/23 10:19 AM  Result Value Ref Range   Vitamin B-12 562 232 - 1,245 pg/mL      Assessment & Plan:   Problem List Items Addressed This Visit        Cardiovascular and Mediastinum   NICM (nonischemic cardiomyopathy) (HCC)   Chronic, ongoing.  Continue collaboration with cardiology and current medication regimen as prescribed by them.  Recent notes reviewed.      Chronic heart failure with preserved ejection fraction (HCC)   Chronic, ongoing with BP at goal. Mild BLE edema today due to sitting up more with current cough, she reports this improves if places feet up.  Continue current medication regimen and collaboration with cardiology.  Recommend continued focus on diet and salt reduction.  Recommend: - Reminded to call for an overnight weight gain of >2 pounds or a weekly weight weight of >5 pounds - not adding salt to his food and has been reading food labels. Reviewed the importance of keeping daily sodium intake to 2000mg  daily -- NO PORK SKINS - Avoid Ibuprofen      Benign hypertensive heart and kidney disease with CHF and stage 2 chronic kidney disease (HCC)   Chronic, stable.  BP at slightly above goal today, but has been taking cold medication.  Continue current medication regimen and collaboration with cardiology.  Recommend checking BP three mornings a week at home + documenting for providers. Education and recommendation provided today on DASH diet focus and avoidance of high sodium foods, like pork skins.  DASH diet focus.  LABS: CMP.          Respiratory   OSA treated with BiPAP   Chronic, ongoing.  Continue collaboration with pulmonary as scheduled and current treatment.      Cough variant asthma vs Upper Airway cough syndrome   Chronic with current exacerbation. Continue O2 2L at night.  Will treat exacerbation with: - Augmentin  BID for 7 days - Prednisone  40 MG daily for 5 days, she is aware to monitor sugars closely with this and it may push them up for short period - Tussionex as needed at night for cough - Albuterol  inhaler refills sent in - Increased rest - Increasing Fluids - Acetaminophen  as needed for  fever/pain.  - Humidifying the air.  - Return in one week and if ongoing will obtain CXR      Relevant Medications   predniSONE  (DELTASONE ) 20 MG tablet   albuterol  (VENTOLIN  HFA) 108 (90 Base) MCG/ACT inhaler     Endocrine   Type 2 diabetes mellitus treated with insulin  (HCC) - Primary   Chronic, ongoing.  Followed by endo.  A1c 7.4% recently with endo. Urine ALB  13 February 2022, recheck today. Continue collaboration with endocrinology, recent notes reviewed and labs. - Continue current medication regimen as ordered by them.  Ensure to attend visits with endo consistently.   - Recommend she continue to monitor BS at home 2-3 times a day.   -  Ensure healthy diet choices at home, reduce candy. - Foot and eye exams up to date. - Vaccinations -- refuses - ARB and statin on board      Proteinuria due to type 2 diabetes mellitus (HCC)   Chronic, ongoing.  Followed by endo.  A1c 7.4% recently with endo. Urine ALB  13 February 2022, recheck today. Continue collaboration with endocrinology, recent notes reviewed and labs. - Continue current medication regimen as ordered by them.  Ensure to attend visits with endo consistently.   - Recommend she continue to monitor BS at home 2-3 times a day.   - Ensure healthy diet choices at home, reduce candy. - Foot and eye exams up to date. - Vaccinations -- refuses - ARB and statin on board      Hyperlipidemia associated with type 2 diabetes mellitus (HCC)   Chronic, ongoing.  Continue current medication regimen as is at goal.  Lipid panel today.      Relevant Orders   Comprehensive metabolic panel with GFR   Lipid Panel w/o Chol/HDL Ratio     Genitourinary   CKD (chronic kidney disease) stage 3, GFR 30-59 ml/min (HCC)   Chronic, ongoing.  Continue to monitor closely.  Maintain ARB on board for protection.  Avoid Ibuprofen products.  Refer to nephrology as needed and renal dose medications as needed.  Educated patient on good hydration at home.       Relevant Orders   Comprehensive metabolic panel with GFR     Other   Morbid obesity (HCC)   BMI 48.91 with diabetes, HTN, HF.  Recommended eating smaller high protein, low fat meals more frequently and exercising 30 mins a day 5 times a week with a goal of 10-15lb weight loss in the next 3 months. Patient voiced their understanding and motivation to adhere to these recommendations.       Other Visit Diagnoses       Colon cancer screening       Cologuard ordered.   Relevant Orders   Cologuard        Follow up plan: Return in about 1 week (around 06/19/2023) for Cough/Asthma.

## 2023-06-12 NOTE — Assessment & Plan Note (Signed)
 Chronic, ongoing.  Continue collaboration with pulmonary as scheduled and current treatment.

## 2023-06-12 NOTE — Assessment & Plan Note (Signed)
 Chronic, stable.  BP at slightly above goal today, but has been taking cold medication.  Continue current medication regimen and collaboration with cardiology.  Recommend checking BP three mornings a week at home + documenting for providers. Education and recommendation provided today on DASH diet focus and avoidance of high sodium foods, like pork skins.  DASH diet focus.  LABS: CMP.

## 2023-06-12 NOTE — Assessment & Plan Note (Addendum)
 Chronic with current exacerbation. Continue O2 2L at night.  Will treat exacerbation with: - Augmentin  BID for 7 days - Prednisone  40 MG daily for 5 days, she is aware to monitor sugars closely with this and it may push them up for short period - Tussionex as needed at night for cough - Albuterol  inhaler refills sent in - Increased rest - Increasing Fluids - Acetaminophen  as needed for fever/pain.  - Humidifying the air.  - Return in one week and if ongoing will obtain CXR

## 2023-06-12 NOTE — Assessment & Plan Note (Signed)
 Chronic, ongoing with BP at goal. Mild BLE edema today due to sitting up more with current cough, she reports this improves if places feet up.  Continue current medication regimen and collaboration with cardiology.  Recommend continued focus on diet and salt reduction.  Recommend: - Reminded to call for an overnight weight gain of >2 pounds or a weekly weight weight of >5 pounds - not adding salt to his food and has been reading food labels. Reviewed the importance of keeping daily sodium intake to 2000mg  daily -- NO PORK SKINS - Avoid Ibuprofen

## 2023-06-13 ENCOUNTER — Ambulatory Visit: Payer: Medicare Other | Admitting: Nurse Practitioner

## 2023-06-13 ENCOUNTER — Encounter (HOSPITAL_COMMUNITY): Payer: Self-pay

## 2023-06-13 DIAGNOSIS — E119 Type 2 diabetes mellitus without complications: Secondary | ICD-10-CM

## 2023-06-13 DIAGNOSIS — G4733 Obstructive sleep apnea (adult) (pediatric): Secondary | ICD-10-CM

## 2023-06-13 DIAGNOSIS — E1169 Type 2 diabetes mellitus with other specified complication: Secondary | ICD-10-CM

## 2023-06-13 DIAGNOSIS — I5032 Chronic diastolic (congestive) heart failure: Secondary | ICD-10-CM

## 2023-06-13 DIAGNOSIS — I130 Hypertensive heart and chronic kidney disease with heart failure and stage 1 through stage 4 chronic kidney disease, or unspecified chronic kidney disease: Secondary | ICD-10-CM

## 2023-06-13 DIAGNOSIS — J45991 Cough variant asthma: Secondary | ICD-10-CM

## 2023-06-13 DIAGNOSIS — Z6841 Body Mass Index (BMI) 40.0 and over, adult: Secondary | ICD-10-CM

## 2023-06-13 DIAGNOSIS — R809 Proteinuria, unspecified: Secondary | ICD-10-CM

## 2023-06-13 DIAGNOSIS — N1831 Chronic kidney disease, stage 3a: Secondary | ICD-10-CM

## 2023-06-13 DIAGNOSIS — I428 Other cardiomyopathies: Secondary | ICD-10-CM

## 2023-06-13 DIAGNOSIS — Z1211 Encounter for screening for malignant neoplasm of colon: Secondary | ICD-10-CM

## 2023-06-13 LAB — COMPREHENSIVE METABOLIC PANEL WITH GFR
ALT: 10 IU/L (ref 0–32)
AST: 12 IU/L (ref 0–40)
Albumin: 3.5 g/dL — ABNORMAL LOW (ref 3.8–4.9)
Alkaline Phosphatase: 147 IU/L — ABNORMAL HIGH (ref 44–121)
BUN/Creatinine Ratio: 10 (ref 9–23)
BUN: 9 mg/dL (ref 6–24)
Bilirubin Total: 0.4 mg/dL (ref 0.0–1.2)
CO2: 29 mmol/L (ref 20–29)
Calcium: 8.3 mg/dL — ABNORMAL LOW (ref 8.7–10.2)
Chloride: 99 mmol/L (ref 96–106)
Creatinine, Ser: 0.86 mg/dL (ref 0.57–1.00)
Globulin, Total: 3.1 g/dL (ref 1.5–4.5)
Glucose: 202 mg/dL — ABNORMAL HIGH (ref 70–99)
Potassium: 3.6 mmol/L (ref 3.5–5.2)
Sodium: 144 mmol/L (ref 134–144)
Total Protein: 6.6 g/dL (ref 6.0–8.5)
eGFR: 80 mL/min/{1.73_m2} (ref >59)

## 2023-06-13 LAB — LIPID PANEL W/O CHOL/HDL RATIO
Cholesterol, Total: 126 mg/dL (ref 100–199)
HDL: 41 mg/dL (ref >39)
LDL Chol Calc (NIH): 73 mg/dL (ref 0–99)
Triglycerides: 53 mg/dL (ref 0–149)
VLDL Cholesterol Cal: 12 mg/dL (ref 5–40)

## 2023-06-14 ENCOUNTER — Encounter: Payer: Self-pay | Admitting: Nurse Practitioner

## 2023-06-14 NOTE — Progress Notes (Signed)
 Contacted via MyChart   Good morning Sara Zhang, your labs have returned: - Kidney function, creatinine and eGFR, remains normal, as is liver function, AST and ALT.  - Calcium  is a little low, add some calcium  rich food or a multivitamin to daily regimen. - Lipid panel stable.  Any questions? Keep being amazing!!  Thank you for allowing me to participate in your care.  I appreciate you. Kindest regards, Millie Forde

## 2023-06-15 NOTE — Patient Instructions (Signed)

## 2023-06-17 DIAGNOSIS — E113293 Type 2 diabetes mellitus with mild nonproliferative diabetic retinopathy without macular edema, bilateral: Secondary | ICD-10-CM | POA: Diagnosis not present

## 2023-06-17 DIAGNOSIS — H2513 Age-related nuclear cataract, bilateral: Secondary | ICD-10-CM | POA: Diagnosis not present

## 2023-06-17 DIAGNOSIS — H40003 Preglaucoma, unspecified, bilateral: Secondary | ICD-10-CM | POA: Diagnosis not present

## 2023-06-18 ENCOUNTER — Other Ambulatory Visit: Payer: Self-pay | Admitting: Nurse Practitioner

## 2023-06-19 NOTE — Telephone Encounter (Signed)
 Too soon for refill, refilled 2//5/25 for 90 and 3 RF.  Requested Prescriptions  Pending Prescriptions Disp Refills   atenolol  (TENORMIN ) 50 MG tablet [Pharmacy Med Name: ATENOLOL  50MG  TABLETS] 90 tablet 3    Sig: TAKE 1 TABLET(50 MG) BY MOUTH DAILY     Cardiovascular: Beta Blockers 2 Passed - 06/19/2023  3:43 PM      Passed - Cr in normal range and within 360 days    Creatinine  Date Value Ref Range Status  09/25/2013 0.75 0.60 - 1.30 mg/dL Final   Creatinine, Ser  Date Value Ref Range Status  06/12/2023 0.86 0.57 - 1.00 mg/dL Final         Passed - Last BP in normal range    BP Readings from Last 1 Encounters:  06/12/23 136/78         Passed - Last Heart Rate in normal range    Pulse Readings from Last 1 Encounters:  06/12/23 86         Passed - Valid encounter within last 6 months    Recent Outpatient Visits           1 week ago Type 2 diabetes mellitus treated with insulin  (HCC)   [facility] [place], Bent T, NP   3 months ago Type 2 diabetes mellitus treated with insulin  (HCC)    [facility] [place], Lavelle Posey, NP       Future Appointments             In 1 month End, Veryl Gottron, MD [facility]

## 2023-06-20 ENCOUNTER — Encounter: Payer: Self-pay | Admitting: Nurse Practitioner

## 2023-06-20 ENCOUNTER — Ambulatory Visit (INDEPENDENT_AMBULATORY_CARE_PROVIDER_SITE_OTHER): Admitting: Nurse Practitioner

## 2023-06-20 VITALS — BP 131/76 | HR 86 | Temp 98.6°F | Ht 69.0 in | Wt 335.4 lb

## 2023-06-20 DIAGNOSIS — J45991 Cough variant asthma: Secondary | ICD-10-CM

## 2023-06-20 NOTE — Progress Notes (Signed)
 BP 131/76   Pulse 86   Temp 98.6 F (37 C) (Oral)   Ht 5\' 9"  (1.753 m)   Wt (!) 335 lb 6.4 oz (152.1 kg)   SpO2 90%   BMI 49.53 kg/m    Subjective:    Patient ID: Sara Zhang, female    DOB: [birth year], 53 y.o.   MRN: [id number]  HPI: Sara Zhang is a 53 y.o. female  Chief Complaint  Patient presents with   Cough   UPPER RESPIRATORY TRACT INFECTION Follow-up today for cough with her asthma.  Treated with Prednisone  and Augmentin , has completed these. Fever: no Cough: a little bit, not much Shortness of breath: with cough at times Wheezing: no Chest pain: no Chest tightness: no Chest congestion: no Nasal congestion: no Runny nose: no Post nasal drip: no Sneezing: no Sore throat: no Swollen glands: no Sinus pressure: no Headache: no Face pain: no Toothache: no Ear pain: none Ear pressure: none Eyes red/itching:no Eye drainage/crusting: no  Vomiting: no Rash: no Fatigue: no Sick contacts: no Strep contacts: no  Context: better Recurrent sinusitis: no Relief with OTC cold/cough medications: yes  Treatments attempted: abx and Prednisone     Relevant past medical, surgical, family and social history reviewed and updated as indicated. Interim medical history since our last visit reviewed. Allergies and medications reviewed and updated.  Review of Systems  Constitutional:  Negative for activity change, appetite change, diaphoresis, fatigue and fever.  Respiratory:  Positive for cough (improving) and shortness of breath (with cough only). Negative for chest tightness and wheezing.   Cardiovascular:  Negative for chest pain, palpitations and leg swelling.  Gastrointestinal: Negative.   Endocrine: Negative for cold intolerance, heat intolerance, polydipsia, polyphagia and polyuria.  Neurological: Negative.   Psychiatric/Behavioral: Negative.     Per HPI unless specifically indicated above     Objective:     BP 131/76   Pulse 86   Temp 98.6 F  (37 C) (Oral)   Ht 5\' 9"  (1.753 m)   Wt (!) 335 lb 6.4 oz (152.1 kg)   SpO2 90%   BMI 49.53 kg/m   Wt Readings from Last 3 Encounters:  06/20/23 (!) 335 lb 6.4 oz (152.1 kg)  06/12/23 (!) 331 lb 3.2 oz (150.2 kg)  06/09/23 (!) 331 lb (150.1 kg)    Physical Exam Vitals and nursing note reviewed.  Constitutional:      General: She is awake. She is not in acute distress.    Appearance: She is well-developed and well-groomed. She is obese. She is not ill-appearing or toxic-appearing.  HENT:     Head: Normocephalic.     Right Ear: Hearing, tympanic membrane, ear canal and external ear normal.     Left Ear: Hearing, tympanic membrane, ear canal and external ear normal.     Nose: Nose normal. No rhinorrhea.     Right Sinus: No maxillary sinus tenderness or frontal sinus tenderness.     Left Sinus: No maxillary sinus tenderness or frontal sinus tenderness.     Mouth/Throat:     Mouth: Mucous membranes are moist.     Pharynx: No pharyngeal swelling, oropharyngeal exudate or posterior oropharyngeal erythema.  Eyes:     General: Lids are normal.        Right eye: No discharge.        Left eye: No discharge.     Conjunctiva/sclera: Conjunctivae normal.     Pupils: Pupils are equal, round, and reactive to light.  Neck:  Thyroid : No thyromegaly.     Vascular: No carotid bruit.  Cardiovascular:     Rate and Rhythm: Normal rate and regular rhythm.     Heart sounds: Normal heart sounds. No murmur heard.    No gallop.  Pulmonary:     Effort: Pulmonary effort is normal. No accessory muscle usage or respiratory distress.     Breath sounds: Normal breath sounds. No decreased breath sounds, wheezing or rales.  Abdominal:     General: Bowel sounds are normal. There is no distension.     Palpations: Abdomen is soft.     Tenderness: There is no abdominal tenderness.  Musculoskeletal:     Cervical back: Normal range of motion and neck supple.     Right lower leg: No edema.     Left lower  leg: No edema.  Lymphadenopathy:     Cervical: No cervical adenopathy.  Skin:    General: Skin is warm and dry.  Neurological:     Mental Status: She is alert and oriented to person, place, and time.     Deep Tendon Reflexes: Reflexes are normal and symmetric.     Reflex Scores:      Brachioradialis reflexes are 2+ on the right side and 2+ on the left side.      Patellar reflexes are 2+ on the right side and 2+ on the left side. Psychiatric:        Attention and Perception: Attention normal.        Mood and Affect: Mood normal.        Speech: Speech normal.        Behavior: Behavior normal. Behavior is cooperative.        Thought Content: Thought content normal.    Results for orders placed or performed in visit on 06/12/23  Comprehensive metabolic panel with GFR   Collection Time: 06/12/23 11:22 AM  Result Value Ref Range   Glucose 202 (H) 70 - 99 mg/dL   BUN 9 6 - 24 mg/dL   Creatinine, Ser 0.98 0.57 - 1.00 mg/dL   eGFR 80 >11 BJ/YNW/2.95   BUN/Creatinine Ratio 10 9 - 23   Sodium 144 134 - 144 mmol/L   Potassium 3.6 3.5 - 5.2 mmol/L   Chloride 99 96 - 106 mmol/L   CO2 29 20 - 29 mmol/L   Calcium  8.3 (L) 8.7 - 10.2 mg/dL   Total Protein 6.6 6.0 - 8.5 g/dL   Albumin 3.5 (L) 3.8 - 4.9 g/dL   Globulin, Total 3.1 1.5 - 4.5 g/dL   Bilirubin Total 0.4 0.0 - 1.2 mg/dL   Alkaline Phosphatase 147 (H) 44 - 121 IU/L   AST 12 0 - 40 IU/L   ALT 10 0 - 32 IU/L  Lipid Panel w/o Chol/HDL Ratio   Collection Time: 06/12/23 11:22 AM  Result Value Ref Range   Cholesterol, Total 126 100 - 199 mg/dL   Triglycerides 53 0 - 149 mg/dL   HDL 41 >62 mg/dL   VLDL Cholesterol Cal 12 5 - 40 mg/dL   LDL Chol Calc (NIH) 73 0 - 99 mg/dL      Assessment & Plan:   Problem List Items Addressed This Visit       Respiratory   Cough variant asthma vs Upper Airway cough syndrome - Primary   Chronic and exacerbation improved. Continue O2 2L at night and collaboration with pulmonary.  Recent notes  reviewed.         Follow up plan:  Return in about 4 months (around 10/13/2023) for T2DM, HTN/HLD, ASTHMA.

## 2023-06-20 NOTE — Assessment & Plan Note (Signed)
 Chronic and exacerbation improved. Continue O2 2L at night and collaboration with pulmonary.  Recent notes reviewed.

## 2023-07-31 ENCOUNTER — Encounter: Payer: Self-pay | Admitting: Podiatry

## 2023-07-31 ENCOUNTER — Ambulatory Visit (INDEPENDENT_AMBULATORY_CARE_PROVIDER_SITE_OTHER): Admitting: Podiatry

## 2023-07-31 DIAGNOSIS — E1142 Type 2 diabetes mellitus with diabetic polyneuropathy: Secondary | ICD-10-CM

## 2023-07-31 DIAGNOSIS — B351 Tinea unguium: Secondary | ICD-10-CM | POA: Diagnosis not present

## 2023-07-31 DIAGNOSIS — M79676 Pain in unspecified toe(s): Secondary | ICD-10-CM | POA: Diagnosis not present

## 2023-08-03 NOTE — Progress Notes (Signed)
  Subjective:  Patient ID: Sara Zhang, female    DOB: April 03, 1970,  MRN: [id number]  Sara Zhang presents to clinic today for painful mycotic toenails of both feet that are difficult to trim. Pain interferes with daily activities and wearing enclosed shoe gear comfortably.  Chief Complaint  Patient presents with   Nail Problem    Diabetic foot care and nail trim - thick painful toenails   Ingrown Toenail    Right big toenail feels much better   New problem(s): None.   PCP is Valerio Moris T, NP. Sara Zhang 06/20/2023.  Allergies  Allergen Reactions   Lisinopril  Cough    Review of Systems: Negative except as noted in the HPI.  Objective: No changes noted in today's physical examination. There were no vitals filed for this visit. Sara Zhang is a pleasant 53 y.o. female morbidly obese in NAD. AAO x 3.  Vascular Examination: Capillary refill time immediate b/l. Palpable pedal pulses. Pedal hair present b/l. No pain with calf compression b/l. Skin temperature gradient WNL b/l. No cyanosis or clubbing b/l. No ischemia or gangrene noted b/l.   Neurological Examination: Pt has subjective symptoms of neuropathy. Sensation grossly intact b/l with 10 gram monofilament.   Dermatological Examination: Pedal skin with normal turgor, texture and tone b/l.  No open wounds. No interdigital macerations.   Toenails 1-5 b/l thick, discolored, elongated with subungual debris and pain on dorsal palpation.   Evidence of partial matrixectomy both borders of right hallux.  Musculoskeletal Examination: Normal muscle strength 5/5 to all lower extremity muscle groups bilaterally. No pain, crepitus or joint limitation noted with ROM b/l LE. No gross bony pedal deformities b/l. Patient ambulates independently without assistive aids.  Radiographs: None  Last A1c:      Latest Ref Rng & Units 08/21/2022    8:31 AM  Hemoglobin A1C  Hemoglobin-A1c 4.8 - 5.6 % 7.7    Assessment/Plan: 1. Pain due  to onychomycosis of toenail   2. Diabetic polyneuropathy associated with type 2 diabetes mellitus (HCC)     Patient was evaluated and treated. All patient's and/or POA's questions/concerns addressed on today's visit. Toenails 1-5 debrided in length and girth without incident. Continue foot and shoe inspections daily. Monitor blood glucose per PCP/Endocrinologist's recommendations. Continue soft, supportive shoe gear daily. Report any pedal injuries to medical professional. Call office if there are any questions/concerns. -Patient/POA to call should there be question/concern in the interim.   Return in about 3 months (around 10/31/2023).  Delon LITTIE Merlin, DPM      [place] LOCATION: [address] [place] [address]                   Office [phone]   [facility] LOCATION: [address] [place], [place] [address] Office [phone]

## 2023-08-07 ENCOUNTER — Ambulatory Visit: Attending: Internal Medicine | Admitting: Internal Medicine

## 2023-08-07 VITALS — BP 124/74 | HR 77 | Ht 68.0 in | Wt 323.8 lb

## 2023-08-07 DIAGNOSIS — I5032 Chronic diastolic (congestive) heart failure: Secondary | ICD-10-CM | POA: Diagnosis not present

## 2023-08-07 DIAGNOSIS — I10 Essential (primary) hypertension: Secondary | ICD-10-CM | POA: Diagnosis not present

## 2023-08-07 DIAGNOSIS — E785 Hyperlipidemia, unspecified: Secondary | ICD-10-CM | POA: Diagnosis not present

## 2023-08-07 DIAGNOSIS — E1169 Type 2 diabetes mellitus with other specified complication: Secondary | ICD-10-CM | POA: Diagnosis not present

## 2023-08-07 DIAGNOSIS — I440 Atrioventricular block, first degree: Secondary | ICD-10-CM | POA: Diagnosis not present

## 2023-08-07 MED ORDER — ATENOLOL 25 MG PO TABS: 25.0000 mg | ORAL_TABLET | Freq: Every day | ORAL | 3 refills | Status: DC

## 2023-08-07 NOTE — Progress Notes (Signed)
 Cardiology Office Note:  .   Date:  08/08/2023  ID:  Sara Zhang, DOB December 18, 1970, MRN [id number] PCP: Valerio Melanie DASEN, NP  [facility] Providers Cardiologist:  Lonni Hanson, MD     History of Present Illness: .   Sara Zhang is a 53 y.o. female with history of heart failure with recovered ejection fractions (LVEF as low as 25-35% in 2014), hypertension, hyperlipidemia, type 2 diabetes mellitus, and obstructive sleep apnea on CPAP, who presents for follow-up of heart failure.  I last saw her in 03/2018 for, at which time she noted minimal lower extremity edema but was otherwise asymptomatic.  She was frustrated by her difficulty losing weight.  We did not make any medication changes or pursue additional testing.  Today, Sara Zhang reports that she has been feeling fairly well, denying chest pain, shortness of breath, palpitations, and lightheadedness.  She notes some mild leg edema, which has been worse recently because she has not been taking her furosemide  as consistently because she has been visiting her father who has been hospitalized.  In spite of this, she happily reports having lost some weight since being started on Mounjaro.  ROS: See HPI  Studies Reviewed: SABRA   EKG Interpretation Date/Time:  Thursday August 07 2023 08:38:00 EDT Ventricular Rate:  77 PR Interval:  236 QRS Duration:  102 QT Interval:  416 QTC Calculation: 470 R Axis:   40  Text Interpretation: Sinus rhythm with 1st degree A-V block Abnormal ECG When compared with ECG of 29-Jun-2022 09:59, HEART RATE has increased PR interval has increased Confirmed by Ethaniel Garfield ([id number]) on 08/08/2023 2:46:19 PM    TTE (11/23/2018): Normal LV size with mild LVH.  LVEF 55-60% with normal diastolic function.  Normal RV size and function.  Mild biatrial enlargement.  No significant valvular abnormality.  TTE (06/10/16): Normal LV size and function with LVEF of 55-60%. Normal diastolic parameters. Mild MR. Normal  RV size and function. Normal pulmonary artery pressure.  Report of LVEF 25-35% with global hypokinesis by outside echo on her before 03/04/12.  Risk Assessment/Calculations:             Physical Exam:   VS:  BP 124/74 (BP Location: Left Arm, Patient Position: Sitting, Cuff Size: Large)   Pulse 77   Ht 5' 8 (1.727 m)   Wt (!) 323 lb 12.8 oz (146.9 kg)   SpO2 93% Comment: normally wears 2L of o2 at home  BMI 49.23 kg/m    Wt Readings from Last 3 Encounters:  08/07/23 (!) 323 lb 12.8 oz (146.9 kg)  06/20/23 (!) 335 lb 6.4 oz (152.1 kg)  06/12/23 (!) 331 lb 3.2 oz (150.2 kg)    General:  NAD. Neck: No gross JVD, though body habitus limits evaluation. Lungs: Clear to auscultation bilaterally without wheezes or crackles. Heart: Distant heart sounds.  Regular rate and rhythm without murmurs, rubs, or gallops. Abdomen: Soft, nontender, nondistended. Extremities: Trace pretibial edema bilaterally.  ASSESSMENT AND PLAN: .    Heart failure with recovered ejection fraction: Sara Zhang continues to do fairly well with stable NYHA class II symptoms, with her functional limitations likely also related to her morbid obesity.  I congratulated her on her weight loss since adding Mounjaro to her medication regimen and encouraged her to continue with this.  Due to first-degree AV block, we will reduce atenolol  and may ultimately have to wean her off beta-blockers altogether if PR interval remains prolonged.  Continue current doses of  losartan  and empagliflozin , as well as furosemide .  Hypertension: Blood pressure well-controlled today.  As above, we will reduce atenolol  to 25 mg daily and likely wean this off altogether.  If we plan to keep her on a beta-blocker long-term, we may well transition her to an alternative agent given less favorable long-term data associated with atenolol .  Continue current doses of losartan  and amlodipine .  Hyperlipidemia associated with type 2 diabetes  mellitus: Continue atorvastatin  and diabetes therapy per Sara Zhang.  First-degree AV block: PR interval has lengthened slightly since last year.  We will reduce atenolol , as above, and have Sara Zhang return in 3 months for repeat ECG.  Low threshold to discontinue beta-blocker therapy altogether.  If PR interval normalizes, I would favor switching her to an evidence-based beta-blocker such as metoprolol succinate, carvedilol, or bisoprolol at follow-up.  Morbid obesity: BMI remains greater than 40 with multiple comorbidities.  However, weight is trending down with addition of Mounjaro.  I encouraged Sara Zhang to continue this medication and to keep working on lifestyle modifications as well to help her lose weight.    Dispo: Return to clinic in 3 months.  Signed, Lonni Hanson, MD

## 2023-08-07 NOTE — Patient Instructions (Signed)
 Medication Instructions:  Your physician recommends the following medication changes.  DECREASE: Atenolol  to 25 mg by mouth daily   *If you need a refill on your cardiac medications before your next appointment, please call your pharmacy*  Lab Work: No labs ordered today    Testing/Procedures: No test ordered today   Follow-Up: At [facility], you and your health needs are our priority.  As part of our continuing mission to provide you with exceptional heart care, our providers are all part of one team.  This team includes your primary Cardiologist (physician) and Advanced Practice Providers or APPs (Physician Assistants and Nurse Practitioners) who all work together to provide you with the care you need, when you need it.  Your next appointment:   3 month(s)  Provider:   You may see Lonni Hanson, MD or one of the following Advanced Practice Providers on your designated Care Team:   Lonni Meager, NP Lesley Maffucci, PA-C Bernardino Bring, PA-C Cadence [place], PA-C Tylene Lunch, NP Barnie Hila, NP

## 2023-08-08 ENCOUNTER — Encounter: Payer: Self-pay | Admitting: Internal Medicine

## 2023-08-08 DIAGNOSIS — I440 Atrioventricular block, first degree: Secondary | ICD-10-CM | POA: Insufficient documentation

## 2023-08-25 DIAGNOSIS — I152 Hypertension secondary to endocrine disorders: Secondary | ICD-10-CM | POA: Diagnosis not present

## 2023-08-25 DIAGNOSIS — E1129 Type 2 diabetes mellitus with other diabetic kidney complication: Secondary | ICD-10-CM | POA: Diagnosis not present

## 2023-08-25 DIAGNOSIS — E1169 Type 2 diabetes mellitus with other specified complication: Secondary | ICD-10-CM | POA: Diagnosis not present

## 2023-08-25 DIAGNOSIS — Z794 Long term (current) use of insulin: Secondary | ICD-10-CM | POA: Diagnosis not present

## 2023-08-25 DIAGNOSIS — Z6841 Body Mass Index (BMI) 40.0 and over, adult: Secondary | ICD-10-CM | POA: Diagnosis not present

## 2023-08-25 DIAGNOSIS — R809 Proteinuria, unspecified: Secondary | ICD-10-CM | POA: Diagnosis not present

## 2023-08-25 DIAGNOSIS — E785 Hyperlipidemia, unspecified: Secondary | ICD-10-CM | POA: Diagnosis not present

## 2023-08-25 DIAGNOSIS — E1159 Type 2 diabetes mellitus with other circulatory complications: Secondary | ICD-10-CM | POA: Diagnosis not present

## 2023-09-20 LAB — HEMOGLOBIN A1C: Hemoglobin-A1c: 7.5

## 2023-09-20 NOTE — Patient Instructions (Signed)
 Plantar Fasciitis: What to Know  Your plantar fascia is a band of thick tissue on the bottom of your foot. It connects your heel bone to the base of your toes. If the fascia gets irritated, it can cause pain in your heel or foot. This is called plantar fasciitis. In some cases, plantar fasciitis can make it hard for you to walk or move. The pain is often worse in the morning after sleeping, or after sitting or lying down for a long time. Pain may also be worse after walking or standing for a long time. What are the causes? Plantar fasciitis may be caused by: Standing for a long time. Wearing shoes that don't have good arch support. Doing high-impact activities. These are things that put stress on your joints. They include: Ballet. Aerobic exercises. These are exercises that make your heart beat faster. Being overweight. Having a way of walking, or gait, that isn't normal. Tight muscles in your calf, which is in the back of your lower leg. High arches in your feet, or flat feet. Starting a new sport or activity. What are the signs or symptoms? The main symptom of plantar fasciitis is heel pain. Your pain may get worse after: You take your first steps after a time of rest. This includes in the morning after you wake up, or after you've been sitting or lying down for a while. Standing still for a long time. Pain may lessen after 30-45 minutes of activity, such as gentle walking. How is this diagnosed? Plantar fasciitis may be diagnosed based on your medical history, your symptoms, and an exam. Your health care provider will check for: A tender spot on the bottom of your foot. A high arch in your foot, or flat feet. Pain when you move your foot. Trouble moving your foot. You may also have tests. These may include: X-rays. Ultrasound. MRI. How is this treated? Treatment depends on how bad your plantar fasciitis is. It may include: RICE therapy. This stands for rest, ice, pressure  (compression), and raising (elevating) the foot. Exercises to stretch your calves and plantar fascia. A night splint. This holds your foot in a stretched, upward position while you sleep. Physical therapy. This can help with symptoms. It can also prevent problems in the future. Shots of a steroid medicine called cortisone. This can help with pain and irritation. Extracorporeal shock wave therapy. This uses electric shocks to stimulate your plantar fascia. If other treatments don't help, you may need to have surgery. Follow these instructions at home: Managing pain, stiffness, and swelling  Use ice, an ice pack, or a frozen bottle of water as told. Place a towel between your skin and the ice. Roll the bottom of your foot over the ice or frozen bottle. Do this for 20 minutes, 2-3 times a day. If your skin turns red, take off the ice right away to prevent skin damage. The risk of damage is higher if you can't feel pain, heat, or cold. Wear shoes that have air-sole or gel-sole cushions. You could also try soft shoe inserts made for plantar fasciitis. Activity Try not to do things that cause pain. Ask what things are safe for you to do. Exercise as told. Try activities that are low impact. This means that they're easier on your joints. They include: Swimming. Water aerobics. Biking. General instructions Take your medicines only as told. Wear a night splint as told. Loosen the splint if your toes tingle, are numb, or turn cold and blue.  Stay at a healthy weight. Work with your provider to lose weight as needed. Contact a health care provider if: Your symptoms don't go away with treatment. You have pain that gets worse. Your pain makes it hard to move or do everyday things. This information is not intended to replace advice given to you by your health care provider. Make sure you discuss any questions you have with your health care provider. Document Revised: 06/24/2022 Document Reviewed:  06/24/2022 Elsevier Patient Education  2024 [employer].

## 2023-09-23 ENCOUNTER — Encounter: Payer: Self-pay | Admitting: Nurse Practitioner

## 2023-09-23 ENCOUNTER — Ambulatory Visit: Admitting: Nurse Practitioner

## 2023-09-23 VITALS — BP 127/80 | HR 61 | Temp 98.6°F | Resp 16 | Ht 67.99 in | Wt 327.2 lb

## 2023-09-23 DIAGNOSIS — Z794 Long term (current) use of insulin: Secondary | ICD-10-CM

## 2023-09-23 DIAGNOSIS — G4733 Obstructive sleep apnea (adult) (pediatric): Secondary | ICD-10-CM

## 2023-09-23 DIAGNOSIS — I130 Hypertensive heart and chronic kidney disease with heart failure and stage 1 through stage 4 chronic kidney disease, or unspecified chronic kidney disease: Secondary | ICD-10-CM | POA: Diagnosis not present

## 2023-09-23 DIAGNOSIS — M79672 Pain in left foot: Secondary | ICD-10-CM | POA: Insufficient documentation

## 2023-09-23 DIAGNOSIS — I5032 Chronic diastolic (congestive) heart failure: Secondary | ICD-10-CM | POA: Diagnosis not present

## 2023-09-23 DIAGNOSIS — E1129 Type 2 diabetes mellitus with other diabetic kidney complication: Secondary | ICD-10-CM | POA: Diagnosis not present

## 2023-09-23 DIAGNOSIS — E119 Type 2 diabetes mellitus without complications: Secondary | ICD-10-CM

## 2023-09-23 DIAGNOSIS — I428 Other cardiomyopathies: Secondary | ICD-10-CM

## 2023-09-23 DIAGNOSIS — N1831 Chronic kidney disease, stage 3a: Secondary | ICD-10-CM | POA: Diagnosis not present

## 2023-09-23 DIAGNOSIS — E1169 Type 2 diabetes mellitus with other specified complication: Secondary | ICD-10-CM

## 2023-09-23 DIAGNOSIS — E559 Vitamin D deficiency, unspecified: Secondary | ICD-10-CM

## 2023-09-23 DIAGNOSIS — E785 Hyperlipidemia, unspecified: Secondary | ICD-10-CM | POA: Diagnosis not present

## 2023-09-23 MED ORDER — JARDIANCE 25 MG PO TABS: ORAL_TABLET | ORAL | 4 refills | Status: AC

## 2023-09-23 MED ORDER — LOSARTAN POTASSIUM 25 MG PO TABS: 25.0000 mg | ORAL_TABLET | Freq: Every day | ORAL | 3 refills | Status: AC

## 2023-09-23 NOTE — Assessment & Plan Note (Signed)
Chronic, ongoing.  Continue current medication regimen as is at goal.  Lipid panel today. 

## 2023-09-23 NOTE — Assessment & Plan Note (Signed)
 For one week, suspect some plantar fascitis present.  Provided her with techniques to use in treating this, including ice water bottle or tennis ball and rolling or lacrosse ball.  Use Voltaren gel or Icy/Hot as needed for pain.  If no improvement in a few weeks she will reach out to podiatry for visit.

## 2023-09-23 NOTE — Assessment & Plan Note (Signed)
Chronic, ongoing.  Continue supplement and recheck Vit D in office today. 

## 2023-09-23 NOTE — Assessment & Plan Note (Signed)
 BMI 49.76 with diabetes, HTN, HF.  Recommended eating smaller high protein, low fat meals more frequently and exercising 30 mins a day 5 times a week with a goal of 10-15lb weight loss in the next 3 months. Patient voiced their understanding and motivation to adhere to these recommendations.

## 2023-09-23 NOTE — Assessment & Plan Note (Signed)
 Chronic, ongoing.  Followed by endo.  A1c 7.5% recently with endo. Urine ALB 150 February 2025. Continue collaboration with endocrinology, recent notes reviewed and labs. - Continue current medication regimen as ordered by them.  Ensure to attend visits with endo consistently.   - Recommend she continue to monitor BS at home 2-3 times a day.   - Ensure healthy diet choices at home, reduce candy. - Foot and eye exams up to date. - Vaccinations -- refuses - ARB and statin on board

## 2023-09-23 NOTE — Assessment & Plan Note (Signed)
 Chronic, ongoing.  Continue collaboration with pulmonary as scheduled and current treatment.

## 2023-09-23 NOTE — Assessment & Plan Note (Signed)
Chronic, ongoing with BP at goal.  Euvolemic today.  Continue current medication regimen and collaboration with cardiology.  Recommend continued focus on diet and salt reduction.  Recommend: - Reminded to call for an overnight weight gain of >2 pounds or a weekly weight weight of >5 pounds - not adding salt to his food and has been reading food labels. Reviewed the importance of keeping daily sodium intake to <2000mg daily -- NO PORK SKINS - Avoid Ibuprofen 

## 2023-09-23 NOTE — Assessment & Plan Note (Signed)
Chronic, ongoing.  Continue to monitor closely.  Maintain ARB on board for protection.  Avoid Ibuprofen products.  Refer to nephrology as needed and renal dose medications as needed.  Educated patient on good hydration at home.

## 2023-09-23 NOTE — Assessment & Plan Note (Signed)
 Chronic, stable.  BP at goal in office today.  Continue current medication regimen and collaboration with cardiology.  Recommend checking BP three mornings a week at home + documenting for providers. Education and recommendation provided today on DASH diet focus and avoidance of high sodium foods, like pork skins.  DASH diet focus.  LABS: CMP.

## 2023-09-23 NOTE — Assessment & Plan Note (Signed)
Chronic, ongoing.  Continue collaboration with cardiology and current medication regimen as prescribed by them.  Recent notes reviewed.

## 2023-09-23 NOTE — Progress Notes (Signed)
 BP 127/80 (BP Location: Left Arm, Patient Position: Sitting, Cuff Size: Large)   Pulse 61   Temp 98.6 F (37 C) (Oral)   Resp 16   Ht 5' 7.99 (1.727 m)   Wt (!) 327 lb 3.2 oz (148.4 kg)   SpO2 98%   BMI 49.76 kg/m    Subjective:    Patient ID: Sara Zhang, female    DOB: Aug 01, 1970, 53 y.o.   MRN: [id number]  HPI: Sara Zhang is a 53 y.o. female  Chief Complaint  Patient presents with   Diabetes    Not over 220 at home. Mostly in the 100's.    Hypertension   BiPAP    Uses machine nightly as prescribed.    Foot Pain    Left heel started last week. No injury. Worse in the am or after sitting for long periods. No OTC remedies.    DIABETES Saw endo last on 08/25/23 with A1c 7.5%.  Taking  Jardiance  25 MG, Basaglar  52 units QHS, and Mounjaro 12.5 MG weekly.  No changes recent endo visit, to cut back on sweets. Endorses eating Butter Fingers at the time, has cut back on these.  Continues on Vitamin D  and B12 supplements daily. Polydipsia/polyuria: no Visual disturbance: no Chest pain: no Paresthesias: no Glucose Monitoring: yes             Accucheck frequency: BID             Fasting glucose: 91 to 122             Evening: sometimes above 200             Before meals: Taking Insulin ?: yes             Long acting insulin : 48 units             Short acting insulin : Blood Pressure Monitoring: not checking Retinal Examination: Up to Date -- [facility], Dr. Mittie Foot Exam: Up to Date Pneumovax: Up to Date Influenza: Not Up to Date -- refuses Aspirin: no    HYPERTENSION / HYPERLIPIDEMIA/HF Taking Losartan  (Lisinopril  caused cough), Furosemide , Amlodipine , Atenolol  + Lipitor for HLD.  Cardiology last seen 08/07/23, Atenolol  was reduced.  Last echo was in October 2020 with EF 55-60%. Satisfied with current treatment? yes Duration of hypertension: chronic BP monitoring frequency: not checking BP range:  BP medication side effects: no Duration of  hyperlipidemia: chronic Cholesterol medication side effects: no Cholesterol supplements: none Medication compliance: good compliance Aspirin: no Recent stressors: no Recurrent headaches: no Visual changes: no Palpitations: no Dyspnea: no Chest pain: no Lower extremity edema: occasional in the ankles Dizzy/lightheaded: no The ASCVD Risk score (Arnett DK, et al., 2019) failed to calculate for the following reasons:   The valid total cholesterol range is 130 to 320 mg/dL  CHRONIC KIDNEY DISEASE CKD status: stable Medications renally dose: yes Previous renal evaluation: no Pneumovax:  Up to Date Influenza Vaccine:  Refuses    ASTHMA Uses O2 every night due to OSA and CPAP.  Saw sleep provider on 06/09/23 for her CPAP.  Albuterol  at home, only uses when sick.   Asthma status: stable Satisfied with current treatment?: yes Albuterol /rescue inhaler frequency: none Dyspnea frequency: no Wheezing frequency: no Cough frequency: no Nocturnal symptom frequency: no Limitation of activity: no Current upper respiratory symptoms: no Triggers: enviromental Aerochamber/spacer use: no Visits to ER or Urgent Care in past year: no Pneumovax: Up to Date Influenza:  refuses  HEEL PAIN Started last week to left heel.  No injuries prior to pain.  Woke up one morning and it was hurting.  It will ease down some at times. Duration: days Involved foot: left Mechanism of injury: unknown Location: heel Onset: sudden  Severity: 9/10 when gets up in morning and when gets up from sitting for long period Quality:  sharp, aching, and throbbing Frequency: intermittent Radiation: no Aggravating factors: when gets up in morning and when gets up from sitting for long period, gets better after walking for a bit Alleviating factors: nothing  Status: stable Treatments attempted: none  Relief with NSAIDs?:  No NSAIDs Taken Weakness with weight bearing or walking: no Morning stiffness: yes Swelling:  no Redness: no Bruising: no Paresthesias / decreased sensation: no  Fevers:no   Relevant past medical, surgical, family and social history reviewed and updated as indicated. Interim medical history since our last visit reviewed. Allergies and medications reviewed and updated.  Review of Systems  Constitutional:  Negative for activity change, appetite change, diaphoresis, fatigue and fever.  Respiratory:  Negative for cough, chest tightness and shortness of breath.   Cardiovascular:  Negative for chest pain, palpitations and leg swelling.  Gastrointestinal: Negative.   Endocrine: Negative for polydipsia, polyphagia and polyuria.  Musculoskeletal:  Positive for arthralgias.  Neurological: Negative.   Psychiatric/Behavioral: Negative.      Per HPI unless specifically indicated above     Objective:    BP 127/80 (BP Location: Left Arm, Patient Position: Sitting, Cuff Size: Large)   Pulse 61   Temp 98.6 F (37 C) (Oral)   Resp 16   Ht 5' 7.99 (1.727 m)   Wt (!) 327 lb 3.2 oz (148.4 kg)   SpO2 98%   BMI 49.76 kg/m   Wt Readings from Last 3 Encounters:  09/23/23 (!) 327 lb 3.2 oz (148.4 kg)  08/07/23 (!) 323 lb 12.8 oz (146.9 kg)  06/20/23 (!) 335 lb 6.4 oz (152.1 kg)    Physical Exam Vitals and nursing note reviewed.  Constitutional:      General: She is awake. She is not in acute distress.    Appearance: She is well-developed and well-groomed. She is obese. She is not ill-appearing or toxic-appearing.  HENT:     Head: Normocephalic.     Right Ear: Hearing and external ear normal.     Left Ear: Hearing and external ear normal.  Eyes:     General: Lids are normal.        Right eye: No discharge.        Left eye: No discharge.     Conjunctiva/sclera: Conjunctivae normal.     Pupils: Pupils are equal, round, and reactive to light.  Neck:     Thyroid : No thyromegaly.     Vascular: No carotid bruit.  Cardiovascular:     Rate and Rhythm: Normal rate and regular rhythm.      Pulses:          Dorsalis pedis pulses are 2+ on the right side and 2+ on the left side.       Posterior tibial pulses are 2+ on the right side and 2+ on the left side.     Heart sounds: Normal heart sounds. No murmur heard.    No gallop.  Pulmonary:     Effort: Pulmonary effort is normal. No accessory muscle usage or respiratory distress.     Breath sounds: Normal breath sounds.  Abdominal:     General: Bowel sounds  are normal. There is no distension.     Palpations: Abdomen is soft.     Tenderness: There is no abdominal tenderness.  Musculoskeletal:     Cervical back: Normal range of motion and neck supple.     Right lower leg: No edema.     Left lower leg: No edema.     Right foot: Normal range of motion.     Left foot: Normal range of motion.  Feet:     Right foot:     Protective Sensation: 10 sites tested.  10 sites sensed.     Toenail Condition: Right toenails are normal.     Left foot:     Protective Sensation: 10 sites tested.  10 sites sensed.     Toenail Condition: Left toenails are normal.     Comments: Pain on palpation of left heel lower aspect and then to medial aspect.  No swelling or bruising noticed.  Full ROM present. Lymphadenopathy:     Cervical: No cervical adenopathy.  Skin:    General: Skin is warm and dry.  Neurological:     Mental Status: She is alert and oriented to person, place, and time.     Deep Tendon Reflexes: Reflexes are normal and symmetric.     Reflex Scores:      Brachioradialis reflexes are 2+ on the right side and 2+ on the left side.      Patellar reflexes are 2+ on the right side and 2+ on the left side. Psychiatric:        Attention and Perception: Attention normal.        Mood and Affect: Mood normal.        Speech: Speech normal.        Behavior: Behavior normal. Behavior is cooperative.        Thought Content: Thought content normal.    Results for orders placed or performed in visit on 09/23/23  Hemoglobin A1c    Collection Time: 08/14/23 12:00 AM  Result Value Ref Range   Hemoglobin-A1c 7.5%       Assessment & Plan:   Problem List Items Addressed This Visit       Cardiovascular and Mediastinum   NICM (nonischemic cardiomyopathy) (HCC)   Chronic, ongoing.  Continue collaboration with cardiology and current medication regimen as prescribed by them.  Recent notes reviewed.      Relevant Medications   losartan  (COZAAR ) 25 MG tablet   Heart failure with recovered ejection fraction (HFrecEF) (HCC)   Chronic, ongoing with BP at goal. Euvolemic today. Continue current medication regimen and collaboration with cardiology.  Recommend continued focus on diet and salt reduction.  Recommend: - Reminded to call for an overnight weight gain of >2 pounds or a weekly weight weight of >5 pounds - not adding salt to his food and has been reading food labels. Reviewed the importance of keeping daily sodium intake to 2000mg  daily -- NO PORK SKINS - Avoid Ibuprofen      Relevant Medications   losartan  (COZAAR ) 25 MG tablet   Benign hypertensive heart and kidney disease with CHF and stage 2 chronic kidney disease (HCC)   Chronic, stable.  BP at goal in office today.  Continue current medication regimen and collaboration with cardiology.  Recommend checking BP three mornings a week at home + documenting for providers. Education and recommendation provided today on DASH diet focus and avoidance of high sodium foods, like pork skins.  DASH diet focus.  LABS: CMP.  Relevant Medications   losartan  (COZAAR ) 25 MG tablet     Respiratory   OSA treated with BiPAP   Chronic, ongoing.  Continue collaboration with pulmonary as scheduled and current treatment.        Endocrine   Type 2 diabetes mellitus treated with insulin  (HCC) - Primary   Chronic, ongoing.  Followed by endo.  A1c 7.5% recently with endo. Urine ALB 150 February 2025. Continue collaboration with endocrinology, recent notes reviewed and labs. -  Continue current medication regimen as ordered by them.  Ensure to attend visits with endo consistently.   - Recommend she continue to monitor BS at home 2-3 times a day.   - Ensure healthy diet choices at home, reduce candy. - Foot and eye exams up to date. - Vaccinations -- refuses - ARB and statin on board      Relevant Medications   JARDIANCE  25 MG TABS tablet   losartan  (COZAAR ) 25 MG tablet   Proteinuria due to type 2 diabetes mellitus (HCC)   Chronic, ongoing.  Followed by endo.  A1c 7.5% recently with endo. Urine ALB 150 February 2025. Continue collaboration with endocrinology, recent notes reviewed and labs. - Continue current medication regimen as ordered by them.  Ensure to attend visits with endo consistently.   - Recommend she continue to monitor BS at home 2-3 times a day.   - Ensure healthy diet choices at home, reduce candy. - Foot and eye exams up to date. - Vaccinations -- refuses - ARB and statin on board      Relevant Medications   JARDIANCE  25 MG TABS tablet   losartan  (COZAAR ) 25 MG tablet   Hyperlipidemia associated with type 2 diabetes mellitus (HCC)   Chronic, ongoing.  Continue current medication regimen as is at goal.  Lipid panel today.      Relevant Medications   JARDIANCE  25 MG TABS tablet   losartan  (COZAAR ) 25 MG tablet   Other Relevant Orders   Comprehensive metabolic panel with GFR   Lipid Panel w/o Chol/HDL Ratio     Genitourinary   CKD (chronic kidney disease) stage 3, GFR 30-59 ml/min (HCC)   Chronic, ongoing.  Continue to monitor closely.  Maintain ARB on board for protection.  Avoid Ibuprofen products.  Refer to nephrology as needed and renal dose medications as needed.  Educated patient on good hydration at home.        Other   Vitamin D  deficiency   Chronic, ongoing.  Continue supplement and recheck Vit D in office today.      Relevant Orders   VITAMIN D  25 Hydroxy (Vit-D Deficiency, Fractures)   Pain of left heel   For one  week, suspect some plantar fascitis present.  Provided her with techniques to use in treating this, including ice water bottle or tennis ball and rolling or lacrosse ball.  Use Voltaren gel or Icy/Hot as needed for pain.  If no improvement in a few weeks she will reach out to podiatry for visit.      Morbid obesity (HCC)   BMI 49.76 with diabetes, HTN, HF.  Recommended eating smaller high protein, low fat meals more frequently and exercising 30 mins a day 5 times a week with a goal of 10-15lb weight loss in the next 3 months. Patient voiced their understanding and motivation to adhere to these recommendations.       Relevant Medications   JARDIANCE  25 MG TABS tablet     Follow up plan: Return  in about 6 months (around 03/25/2024) for T2DM, HTN/HLD.

## 2023-09-24 ENCOUNTER — Ambulatory Visit: Payer: Self-pay | Admitting: Nurse Practitioner

## 2023-09-24 LAB — LIPID PANEL W/O CHOL/HDL RATIO
Cholesterol, Total: 142 mg/dL (ref 100–199)
HDL: 43 mg/dL (ref >39)
LDL Chol Calc (NIH): 86 mg/dL (ref 0–99)
Triglycerides: 64 mg/dL (ref 0–149)
VLDL Cholesterol Cal: 13 mg/dL (ref 5–40)

## 2023-09-24 LAB — COMPREHENSIVE METABOLIC PANEL WITH GFR
ALT: 14 IU/L (ref 0–32)
AST: 13 IU/L (ref 0–40)
Albumin: 3.6 g/dL — ABNORMAL LOW (ref 3.8–4.9)
Alkaline Phosphatase: 150 IU/L — ABNORMAL HIGH (ref 44–121)
BUN/Creatinine Ratio: 19 (ref 9–23)
BUN: 19 mg/dL (ref 6–24)
Bilirubin Total: 0.4 mg/dL (ref 0.0–1.2)
CO2: 25 mmol/L (ref 20–29)
Calcium: 8.4 mg/dL — ABNORMAL LOW (ref 8.7–10.2)
Chloride: 103 mmol/L (ref 96–106)
Creatinine, Ser: 1.02 mg/dL — ABNORMAL HIGH (ref 0.57–1.00)
Globulin, Total: 2.7 g/dL (ref 1.5–4.5)
Glucose: 167 mg/dL — ABNORMAL HIGH (ref 70–99)
Potassium: 4.3 mmol/L (ref 3.5–5.2)
Sodium: 141 mmol/L (ref 134–144)
Total Protein: 6.3 g/dL (ref 6.0–8.5)
eGFR: 65 mL/min/1.73 (ref >59)

## 2023-09-24 LAB — VITAMIN D 25 HYDROXY (VIT D DEFICIENCY, FRACTURES): Vit D, 25-Hydroxy: 48.9 ng/mL (ref 30.0–100.0)

## 2023-09-24 NOTE — Progress Notes (Signed)
 Contacted via MyChart  Good afternoon Milta, your labs have returned: - Kidney function, creatinine and eGFR, remains stable, as is liver function, AST and ALT.  - Calcium  level remains a little low, please add some calcium  rich foods to diet daily and even an over the counter supplement. - Lipid panel stable.  Any questions? Keep being amazing!!  Thank you for allowing me to participate in your care.  I appreciate you. Kindest regards, Elnita Surprenant

## 2023-10-07 ENCOUNTER — Ambulatory Visit: Payer: Self-pay

## 2023-10-07 VITALS — BP 130/70 | Ht 68.0 in | Wt 336.2 lb

## 2023-10-07 DIAGNOSIS — Z0000 Encounter for general adult medical examination without abnormal findings: Secondary | ICD-10-CM

## 2023-10-07 DIAGNOSIS — Z1231 Encounter for screening mammogram for malignant neoplasm of breast: Secondary | ICD-10-CM

## 2023-10-07 NOTE — Progress Notes (Signed)
 Subjective:   Sara Zhang is a 53 y.o. who presents for a Medicare Wellness preventive visit.  As a reminder, Annual Wellness Visits don't include a physical exam, and some assessments may be limited, especially if this visit is performed virtually. We may recommend an in-person follow-up visit with your provider if needed.  Visit Complete: In person    Persons Participating in Visit: Patient.  AWV Questionnaire: Yes: Patient Medicare AWV questionnaire was completed by the patient on 10/06/23; I have confirmed that all information answered by patient is correct and no changes since this date.  Cardiac Risk Factors include: diabetes mellitus;dyslipidemia;hypertension;obesity (BMI >30kg/m2);Other (see comment), Risk factor comments: OSA (bi pap)     Objective:    Today's Vitals   10/07/23 0806  BP: 130/70  Weight: (!) 336 lb 3.2 oz (152.5 kg)  Height: 5' 8 (1.727 m)   Body mass index is 51.12 kg/m.     10/07/2023    8:17 AM 10/01/2022    8:26 AM 04/29/2022    5:00 PM 09/24/2021    8:38 AM 09/22/2020    8:15 AM 02/04/2020    8:40 PM 09/20/2019    8:16 AM  Advanced Directives  Does Patient Have a Medical Advance Directive? No No No No No No No  Would patient like information on creating a medical advance directive? Yes (MAU/Ambulatory/Procedural Areas - Information given) Yes (MAU/Ambulatory/Procedural Areas - Information given) No - Patient declined No - Patient declined  No - Patient declined     Current Medications (verified) Outpatient Encounter Medications as of 10/07/2023  Medication Sig   albuterol  (VENTOLIN  HFA) 108 (90 Base) MCG/ACT inhaler Inhale 2 puffs into the lungs every 6 (six) hours as needed for wheezing or shortness of breath.   amLODipine  (NORVASC ) 5 MG tablet TAKE 1 TABLET(5 MG) BY MOUTH DAILY   atenolol  (TENORMIN ) 25 MG tablet Take 1 tablet (25 mg total) by mouth daily.   atorvastatin  (LIPITOR) 10 MG tablet TAKE 1 TABLET(10 MG) BY MOUTH DAILY   Blood  Glucose Monitoring Suppl (CONTOUR NEXT MONITOR) w/Device KIT To check blood sugar 4 times daily.   Calcium  Carbonate-Vitamin D  (OYSTER SHELL CALCIUM  500 + D) 500-125 MG-UNIT TABS Take 1 tablet by mouth daily.    Cholecalciferol  1.25 MG (50000 UT) TABS Take 1 tablet by mouth once a week.   furosemide  (LASIX ) 40 MG tablet TAKE 1 TABLET(40 MG) BY MOUTH TWICE DAILY   glucose blood (CONTOUR NEXT TEST) test strip TEST BLOOD SUGAR ONCE DAILY   Insulin  Glargine (BASAGLAR  KWIKPEN) 100 UNIT/ML Inject 52 Units into the skin at bedtime. (Patient taking differently: Inject 52 Units into the skin at bedtime. 48-52 units qhs)   Insulin  Pen Needle 32G X 4 MM MISC 1 Units by Does not apply route every morning. Pen needles   JARDIANCE  25 MG TABS tablet TAKE 1 TABLET(25 MG) BY MOUTH DAILY   Lancets (ONETOUCH ULTRASOFT) lancets Use as instructed   levonorgestrel  (MIRENA ) 20 MCG/24HR IUD 1 each by Intrauterine route once.    losartan  (COZAAR ) 25 MG tablet Take 1 tablet (25 mg total) by mouth daily. OFFICE VISIT NEEDED FOR ADDITIONAL REFILLS   OXYGEN Inhale 2 L into the lungs daily.   tirzepatide (MOUNJARO) 15 MG/0.5ML Pen Inject 15 mg into the skin once a week.   ferrous sulfate  325 (65 FE) MG tablet Take 325 mg by mouth daily with breakfast. (Patient not taking: Reported on 10/07/2023)   No facility-administered encounter medications on file as of  10/07/2023.    Allergies (verified) Lisinopril    History: Past Medical History:  Diagnosis Date   Abnormal uterine bleeding (AUB) 11/25/2016   Chronic diastolic CHF (congestive heart failure) (HCC)    a. 02/2012 Echo: EF 25-35%, glob HK; b. 06/2016 Echo: EF 55-60%, mild MR, nl PASP.   CKD (chronic kidney disease), stage II    COVID-19    Diabetes mellitus without complication (HCC)    Extreme obesity    Hyperlipidemia    Hypertension    Hypoxia    Microcytic anemia    Pneumonia    Pneumonia    Sleep apnea    Status post endometrial ablation 06/20/2016   2015  hysteroscopy/D&C with NovaSure endometrial ablation; pathology-benign endometrial polyps   Thickened endometrium 06/20/2016   Ultrasound-22.3 mm endometrial stripe, heterogenous   Vaginal bleeding    a. 01/2017 s/p hyteroscopy and polypectomy [facility]).   Vitamin D  deficiency    Past Surgical History:  Procedure Laterality Date   CESAREAN SECTION     CHOLECYSTECTOMY     DILATION AND CURETTAGE, DIAGNOSTIC / THERAPEUTIC     ERCP N/A 06/27/2022   Procedure: ENDOSCOPIC RETROGRADE CHOLANGIOPANCREATOGRAPHY (ERCP);  Surgeon: Jinny Carmine, MD;  Location: [facility] ENDOSCOPY;  Service: Endoscopy;  Laterality: N/A;   TONSILLECTOMY AND ADENOIDECTOMY     TOOTH EXTRACTION  01/2016   Family History  Problem Relation Age of Onset   Diabetes Mother    Hyperlipidemia Mother    Hypertension Mother    Stroke Mother    Arthritis Father    Asthma Father    Diabetes Father    Hypertension Father    Hypertension Brother    Diabetes Brother    Diabetes Maternal Grandmother    Hypertension Maternal Grandmother    Diabetes Maternal Grandfather    Hypertension Maternal Grandfather    Diabetes Paternal Grandmother    Hypertension Paternal Grandmother    Heart attack Maternal Uncle    Breast cancer Neg Hx    Social History   Socioeconomic History   Marital status: Single    Spouse name: Not on file   Number of children: 1   Years of education: GED   Highest education level: GED or equivalent  Occupational History   Occupation: disability  Tobacco Use   Smoking status: Never   Smokeless tobacco: Never  Vaping Use   Vaping status: Never Used  Substance and Sexual Activity   Alcohol use: Yes    Alcohol/week: 0.0 standard drinks of alcohol    Comment: 1-2 beers/month   Drug use: No   Sexual activity: Yes    Birth control/protection: I.U.D.  Other Topics Concern   Not on file  Social History Narrative   Never married, 1 son.  The patient recently lost an aunt in December 2020 and feels she got C19  related to the amount of family and friends coming in and out to pay respects to her aunt.    Social Drivers of Corporate investment banker Strain: Low Risk  (10/07/2023)   Overall Financial Resource Strain (CARDIA)    Difficulty of Paying Living Expenses: Not very hard  Recent Concern: Financial Resource Strain - Medium Risk (09/21/2023)   Overall Financial Resource Strain (CARDIA)    Difficulty of Paying Living Expenses: Somewhat hard  Food Insecurity: No Food Insecurity (10/07/2023)   Hunger Vital Sign    Worried About Running Out of Food in the Last Year: Never true    Ran Out of Food in the Last  Year: Never true  Transportation Needs: No Transportation Needs (10/07/2023)   PRAPARE - Administrator, Civil Service (Medical): No    Lack of Transportation (Non-Medical): No  Physical Activity: Sufficiently Active (10/07/2023)   Exercise Vital Sign    Days of Exercise per Week: 2 days    Minutes of Exercise per Session: 90 min  Recent Concern: Physical Activity - Insufficiently Active (09/21/2023)   Exercise Vital Sign    Days of Exercise per Week: 2 days    Minutes of Exercise per Session: 20 min  Stress: No Stress Concern Present (10/07/2023)   [employer] of Occupational Health - Occupational Stress Questionnaire    Feeling of Stress: Only a little  Social Connections: Moderately Isolated (10/07/2023)   Social Connection and Isolation Panel    Frequency of Communication with Friends and Family: More than three times a week    Frequency of Social Gatherings with Friends and Family: More than three times a week    Attends Religious Services: More than 4 times per year    Active Member of [employer] or Organizations: No    Attends Engineer, structural: Never    Marital Status: Never married    Tobacco Counseling Counseling given: Not Answered    Clinical Intake:  Pre-visit preparation completed: Yes  Pain : No/denies pain     BMI - recorded:  51.12 Nutritional Status: BMI > 30  Obese Nutritional Risks: None Diabetes: Yes CBG done?: No Did pt. bring in CBG monitor from home?: No  Lab Results  Component Value Date   HGBA1C 7.5% 08/14/2023   HGBA1C 7.7 (H) 08/21/2022   HGBA1C 7.4 (H) 05/22/2022     How often do you need to have someone help you when you read instructions, pamphlets, or other written materials from your doctor or pharmacy?: 1 - Never  Interpreter Needed?: No  Information entered by :: Vina Ned, CMA   Activities of Daily Living     10/07/2023    8:10 AM 10/06/2023    8:06 PM  In your present state of health, do you have any difficulty performing the following activities:  Hearing? 0 0  Vision? 0 0  Difficulty concentrating or making decisions? 0 0  Walking or climbing stairs? 0 0  Dressing or bathing? 0 0  Doing errands, shopping? 0 0  Preparing Food and eating ? N N  Using the Toilet? N N  In the past six months, have you accidently leaked urine? N N  Do you have problems with loss of bowel control? N N  Managing your Medications? N N  Managing your Finances? N N  Housekeeping or managing your Housekeeping? N N    Patient Care Team: Cannady, Jolene T, NP as PCP - General (Nurse Practitioner) End, Lonni, MD as PCP - Cardiology (Cardiology) Mittie Gaskin, MD as Referring Physician (Ophthalmology) Brendia Calton Buel DEVONNA (Endocrinology) Verta Royden DASEN, [place] as Consulting Physician (Podiatry) Fernand Elfreda LABOR, MD as Consulting Physician (Pulmonary Disease)  I have updated your Care Teams any recent Medical Services you may have received from other providers in the past year.     Assessment:   This is a routine wellness examination for Sara Zhang.  Hearing/Vision screen Hearing Screening - Comments:: Denies hearing loss  Vision Screening - Comments:: Gets DM eye exams, Dr. Mittie, [place]   Goals Addressed               This Visit's Progress  Weight (lb) < 300 lb (136.1 kg) (pt-stated)   336 lb 3.2 oz (152.5 kg)      Depression Screen     10/07/2023    8:15 AM 09/23/2023    9:51 AM 06/20/2023   11:32 AM 06/12/2023   11:25 AM 03/12/2023   10:02 AM 10/01/2022    8:23 AM 08/21/2022    8:37 AM  PHQ 2/9 Scores  PHQ - 2 Score 0 0 0 0 0 0 0  PHQ- 9 Score 0  0 1 1 0 1    Fall Risk     10/07/2023    8:19 AM 10/06/2023    8:06 PM 09/23/2023    9:51 AM 06/12/2023   11:25 AM 03/12/2023   10:01 AM  Fall Risk   Falls in the past year? 0 0 0 0 0  Number falls in past yr: 0 0 0 0 0  Injury with Fall? 0 0 0 0 0  Risk for fall due to : No Fall Risks  No Fall Risks No Fall Risks No Fall Risks  Follow up Falls evaluation completed  Falls evaluation completed Falls evaluation completed Falls evaluation completed    MEDICARE RISK AT HOME:  Medicare Risk at Home Any stairs in or around the home?: Yes If so, are there any without handrails?: No Home free of loose throw rugs in walkways, pet beds, electrical cords, etc?: Yes Adequate lighting in your home to reduce risk of falls?: Yes Life alert?: No Use of a cane, walker or w/c?: No Grab bars in the bathroom?: No Shower chair or bench in shower?: No Elevated toilet seat or a handicapped toilet?: No  TIMED UP AND GO:  Was the test performed?  Yes  Length of time to ambulate 10 feet: 5 sec Gait steady and fast without use of assistive device  Cognitive Function: 6CIT completed        10/07/2023    8:19 AM 10/01/2022    8:28 AM 09/24/2021    8:40 AM 09/22/2020    8:19 AM 09/20/2019    8:19 AM  6CIT Screen  What Year? 0 points 0 points 0 points 0 points 0 points  What month? 0 points 0 points 0 points 0 points 0 points  What time? 0 points 0 points 0 points 0 points 0 points  Count back from 20 0 points 0 points 0 points 0 points 0 points  Months in reverse 0 points 0 points 0 points 0 points 0 points  Repeat phrase 0 points 0 points 0 points 0 points 0 points  Total Score 0 points 0  points 0 points 0 points 0 points    Immunizations Immunization History  Administered Date(s) Administered   Dtap, Unspecified 07/20/1969, 06/20/1970   PFIZER(Purple Top)SARS-COV-2 Vaccination 09/09/2019, 10/06/2019   Pneumococcal Polysaccharide-23 01/12/2007, 04/18/2009   Polio, Unspecified 07/20/1969, 06/20/1970   Rubella 09/21/1992   Td 04/13/1992   Tdap 07/07/2007    Screening Tests Health Maintenance  Topic Date Due   Hepatitis B Vaccines 19-59 Average Risk (1 of 3 - 19+ 3-dose series) Never done   Fecal DNA (Cologuard)  03/27/2023   MAMMOGRAM  07/19/2023   COVID-19 Vaccine (3 - 2025-26 season) 10/06/2023   Zoster Vaccines- Shingrix (1 of 2) 12/24/2023 (Originally 12/13/2018)   INFLUENZA VACCINE  05/04/2024 (Originally 09/05/2023)   Cervical Cancer Screening (HPV/Pap Cotest)  09/22/2024 (Originally 12/13/1998)   Pneumococcal Vaccine: 50+ Years (3 of 3 - PCV) 09/22/2024 (Originally 04/19/2010)   OPHTHALMOLOGY  EXAM  12/17/2023   HEMOGLOBIN A1C  02/25/2024   Diabetic kidney evaluation - Urine ACR  03/11/2024   FOOT EXAM  07/30/2024   Diabetic kidney evaluation - eGFR measurement  09/22/2024   Medicare Annual Wellness (AWV)  10/06/2024   Hepatitis C Screening  Completed   HIV Screening  Completed   HPV VACCINES  Aged Out   Meningococcal B Vaccine  Aged Out   DTaP/Tdap/Td  Discontinued    Health Maintenance  Health Maintenance Due  Topic Date Due   Hepatitis B Vaccines 19-59 Average Risk (1 of 3 - 19+ 3-dose series) Never done   Fecal DNA (Cologuard)  03/27/2023   MAMMOGRAM  07/19/2023   COVID-19 Vaccine (3 - 2025-26 season) 10/06/2023   Health Maintenance Items Addressed: Mammogram ordered, See Nurse Notes at the end of this note  Additional Screening:  Vision Screening: Recommended annual ophthalmology exams for early detection of glaucoma and other disorders of the eye. Would you like a referral to an eye doctor? No    Dental Screening: Recommended annual dental  exams for proper oral hygiene  Community Resource Referral / Chronic Care Management: CRR required this visit?  No   CCM required this visit?  No   Plan:    I have personally reviewed and noted the following in the patient's chart:   Medical and social history Use of alcohol, tobacco or illicit drugs  Current medications and supplements including opioid prescriptions. Patient is not currently taking opioid prescriptions. Functional ability and status Nutritional status Physical activity Advanced directives List of other physicians Hospitalizations, surgeries, and ER visits in previous 12 months Vitals Screenings to include cognitive, depression, and falls Referrals and appointments  In addition, I have reviewed and discussed with patient certain preventive protocols, quality metrics, and best practice recommendations. A written personalized care plan for preventive services as well as general preventive health recommendations were provided to patient.   Vina Ned, CMA   10/07/2023   After Visit Summary: (In Person-Printed) AVS printed and given to the patient  Notes:  Placed order for MMG Pt has cologuard kit at home. Will complete and mail back. Needs pneumonia and Tdap vaccines Declined DM & Nutrition education referral Declined Covid and flu vaccines

## 2023-10-07 NOTE — Patient Instructions (Signed)
 Sara Zhang , Thank you for taking time out of your busy schedule to complete your Annual Wellness Visit with me. I enjoyed our conversation and look forward to speaking with you again next year. I, as well as your care team,  appreciate your ongoing commitment to your health goals. Please review the following plan we discussed and let me know if I can assist you in the future. Your Game plan/ To Do List    Referrals: None  Follow up Visits: We will see or speak with you next year for your Next Medicare AWV with our clinical staff Have you seen your provider in the last 6 months (3 months if uncontrolled diabetes)? Yes  Clinician Recommendations: Get your diabetic eye exam due 12/2023. Get Tdap vaccine at your local pharmacy at your convenience. Complete the cologuard kit that you have and home and return it by mail.  Aim for 30 minutes of exercise or brisk walking, 6-8 glasses of water, and 5 servings of fruits and vegetables each day.   Please call to schedule your mammogram:  [facility] Address: [address], [place], [place] Phone: [phone]  [facility] [address] [place], [place] [address] Phone: [phone]        This is a list of the screenings recommended for you:  Health Maintenance  Topic Date Due   Hepatitis B Vaccine (1 of 3 - 19+ 3-dose series) Never done   Cologuard (Stool DNA test)  03/27/2023   Mammogram  07/19/2023   COVID-19 Vaccine (3 - 2025-26 season) 10/06/2023   Zoster (Shingles) Vaccine (1 of 2) 12/24/2023*   Flu Shot  05/04/2024*   Pap with HPV screening  09/22/2024*   Pneumococcal Vaccine for age over 86 (3 of 3 - PCV) 09/22/2024*   Eye exam for diabetics  12/17/2023   Hemoglobin A1C  02/25/2024   Yearly kidney health urinalysis for diabetes  03/11/2024   Complete foot exam   07/30/2024   Yearly kidney function blood test for diabetes  09/22/2024   Medicare  Annual Wellness Visit  10/06/2024   Hepatitis C Screening  Completed   HIV Screening  Completed   HPV Vaccine  Aged Out   Meningitis B Vaccine  Aged Out   DTaP/Tdap/Td vaccine  Discontinued  *Topic was postponed. The date shown is not the original due date.    Advanced directives: (Provided) Advance directive discussed with you today. I have provided a copy for you to complete at home and have notarized. Once this is complete, please bring a copy in to our office so we can scan it into your chart.  Advance Care Planning is important because it:  [x]  Makes sure you receive the medical care that is consistent with your values, goals, and preferences  [x]  It provides guidance to your family and loved ones and reduces their decisional burden about whether or not they are making the right decisions based on your wishes.  Follow the link provided in your after visit summary or read over the paperwork we have mailed to you to help you started getting your Advance Directives in place. If you need assistance in completing these, please reach out to us  so that we can help you!  See attachments for Preventive Care and Fall Prevention Tips.   Fall Prevention in the Home, Adult Falls can cause injuries and affect people of all ages. There are many simple things that you  can do to make your home safe and to help prevent falls. If you need it, ask for help making these changes. What actions can I take to prevent falls? General information Use good lighting in all rooms. Make sure to: Replace any light bulbs that burn out. Turn on lights if it is dark and use night-lights. Keep items that you use often in easy-to-reach places. Lower the shelves around your home if needed. Move furniture so that there are clear paths around it. Do not keep throw rugs or other things on the floor that can make you trip. If any of your floors are uneven, fix them. Add color or contrast paint or tape to clearly mark and  help you see: Grab bars or handrails. First and last steps of staircases. Where the edge of each step is. If you use a ladder or stepladder: Make sure that it is fully opened. Do not climb a closed ladder. Make sure the sides of the ladder are locked in place. Have someone hold the ladder while you use it. Know where your pets are as you move through your home. What can I do in the bathroom?     Keep the floor dry. Clean up any water that is on the floor right away. Remove soap buildup in the bathtub or shower. Buildup makes bathtubs and showers slippery. Use non-skid mats or decals on the floor of the bathtub or shower. Attach bath mats securely with double-sided, non-slip rug tape. If you need to sit down while you are in the shower, use a non-slip stool. Install grab bars by the toilet and in the bathtub and shower. Do not use towel bars as grab bars. What can I do in the bedroom? Make sure that you have a light by your bed that is easy to reach. Do not use any sheets or blankets on your bed that hang to the floor. Have a firm bench or chair with side arms that you can use for support when you get dressed. What can I do in the kitchen? Clean up any spills right away. If you need to reach something above you, use a sturdy step stool that has a grab bar. Keep electrical cables out of the way. Do not use floor polish or wax that makes floors slippery. What can I do with my stairs? Do not leave anything on the stairs. Make sure that you have a light switch at the top and the bottom of the stairs. Have them installed if you do not have them. Make sure that there are handrails on both sides of the stairs. Fix handrails that are broken or loose. Make sure that handrails are as long as the staircases. Install non-slip stair treads on all stairs in your home if they do not have carpet. Avoid having throw rugs at the top or bottom of stairs, or secure the rugs with carpet tape to prevent  them from moving. Choose a carpet design that does not hide the edge of steps on the stairs. Make sure that carpet is firmly attached to the stairs. Fix any carpet that is loose or worn. What can I do on the outside of my home? Use bright outdoor lighting. Repair the edges of walkways and driveways and fix any cracks. Clear paths of anything that can make you trip, such as tools or rocks. Add color or contrast paint or tape to clearly mark and help you see high doorway thresholds. Trim any bushes or trees on  the main path into your home. Check that handrails are securely fastened and in good repair. Both sides of all steps should have handrails. Install guardrails along the edges of any raised decks or porches. Have leaves, snow, and ice cleared regularly. Use sand, salt, or ice melt on walkways during winter months if you live where there is ice and snow. In the garage, clean up any spills right away, including grease or oil spills. What other actions can I take? Review your medicines with your health care provider. Some medicines can make you confused or feel dizzy. This can increase your chance of falling. Wear closed-toe shoes that fit well and support your feet. Wear shoes that have rubber soles and low heels. Use a cane, walker, scooter, or crutches that help you move around if needed. Talk with your provider about other ways that you can decrease your risk of falls. This may include seeing a physical therapist to learn to do exercises to improve movement and strength. Where to find more information Centers for Disease Control and Prevention, STEAD[employer]: TonerPromos.no [employer] on Aging: BaseRingTones.pl National Institute on Aging: BaseRingTones.pl Contact a health care provider if: You are afraid of falling at home. You feel weak, drowsy, or dizzy at home. You fall at home. Get help right away if you: Lose consciousness or have trouble moving after a fall. Have a fall that causes a head  injury. These symptoms may be an emergency. Get help right away. Call 911. Do not wait to see if the symptoms will go away. Do not drive yourself to the hospital. This information is not intended to replace advice given to you by your health care provider. Make sure you discuss any questions you have with your health care provider. Document Revised: 09/24/2021 Document Reviewed: 09/24/2021 Elsevier Patient Education  2024 [employer].

## 2023-11-05 DIAGNOSIS — R9431 Abnormal electrocardiogram [ECG] [EKG]: Secondary | ICD-10-CM | POA: Diagnosis not present

## 2023-11-05 DIAGNOSIS — I509 Heart failure, unspecified: Secondary | ICD-10-CM | POA: Diagnosis not present

## 2023-11-05 DIAGNOSIS — R10816 Epigastric abdominal tenderness: Secondary | ICD-10-CM | POA: Diagnosis not present

## 2023-11-05 DIAGNOSIS — I130 Hypertensive heart and chronic kidney disease with heart failure and stage 1 through stage 4 chronic kidney disease, or unspecified chronic kidney disease: Secondary | ICD-10-CM | POA: Diagnosis not present

## 2023-11-05 DIAGNOSIS — R112 Nausea with vomiting, unspecified: Secondary | ICD-10-CM | POA: Diagnosis not present

## 2023-11-05 DIAGNOSIS — Z7985 Long-term (current) use of injectable non-insulin antidiabetic drugs: Secondary | ICD-10-CM | POA: Diagnosis not present

## 2023-11-05 DIAGNOSIS — R100 Acute abdomen: Secondary | ICD-10-CM | POA: Diagnosis not present

## 2023-11-05 DIAGNOSIS — R197 Diarrhea, unspecified: Secondary | ICD-10-CM | POA: Diagnosis not present

## 2023-11-05 DIAGNOSIS — E1122 Type 2 diabetes mellitus with diabetic chronic kidney disease: Secondary | ICD-10-CM | POA: Diagnosis not present

## 2023-11-05 DIAGNOSIS — R000 Tachycardia, unspecified: Secondary | ICD-10-CM | POA: Diagnosis not present

## 2023-11-05 DIAGNOSIS — R1084 Generalized abdominal pain: Secondary | ICD-10-CM | POA: Diagnosis not present

## 2023-11-05 DIAGNOSIS — Z79899 Other long term (current) drug therapy: Secondary | ICD-10-CM | POA: Diagnosis not present

## 2023-11-05 DIAGNOSIS — I5020 Unspecified systolic (congestive) heart failure: Secondary | ICD-10-CM | POA: Diagnosis not present

## 2023-11-05 DIAGNOSIS — N183 Chronic kidney disease, stage 3 unspecified: Secondary | ICD-10-CM | POA: Diagnosis not present

## 2023-11-05 NOTE — Progress Notes (Unsigned)
 Cardiology Office Note   Date:  11/07/2023  ID:  Sara Zhang, DOB [birth year], MRN [id number] PCP: Valerio Melanie DASEN, NP  [facility] Providers Cardiologist:  Sara Hanson, MD   History of Present Illness Sara Zhang is a 53 y.o. female with h/o HFimpEF, HTN, HLD, DM2, OSA on CPAP who presents for follow-up.   Echo in 11/2018 showed LVEF 55-60%, mild LVH, mildly dilated atrium.   The patient was last seen 08/07/23 and was stable from a cardiac perspective.   Today, the patient is overall doing well. She denies chest pain.she went to the ER at [facility] the other day for stomach issues, nausea, vomiting and pain. WBC and LFTs were elevated, she was told she may have ?hepatitis. She has follow-up with PCP. She denies lower leg edema or SOB. She denies lightheadedness and dizziness.   Studies Reviewed EKG Interpretation Date/Time:  Friday November 07 2023 09:29:15 EDT Ventricular Rate:  75 PR Interval:  208 QRS Duration:  96 QT Interval:  422 QTC Calculation: 471 R Axis:   40  Text Interpretation: Normal sinus rhythm First degree A-V block Normal ECG When compared with ECG of 07-Aug-2023 08:38, No significant change was found Reconfirmed by Franchester, Breeana Sawtelle ([id number]) on 11/07/2023 9:31:20 AM    Echo 11/2018  1. Left ventricular ejection fraction, by visual estimation, is 55 to  60%. The left ventricle has normal function. Normal left ventricular size.  There is mildly increased left ventricular hypertrophy.   2. Definity  contrast agent was given IV to delineate the left ventricular  endocardial borders.   3. Global right ventricle has normal systolic function.The right  ventricular size is normal. No increase in right ventricular wall  thickness.   4. Left atrial size was mildly dilated.   5. Right atrial size was mildly dilated.   6. The mitral valve is normal in structure. No evidence of mitral valve  regurgitation. No evidence of mitral stenosis.   7. The tricuspid  valve is normal in structure. Tricuspid valve  regurgitation was not visualized by color flow Doppler.   8. The aortic valve is normal in structure. Aortic valve regurgitation  was not visualized by color flow Doppler. Structurally normal aortic  valve, with no evidence of sclerosis or stenosis.   9. The pulmonic valve was normal in structure. Pulmonic valve  regurgitation is not visualized by color flow Doppler.  10. TR signal is inadequate for assessing pulmonary artery systolic  pressure.       Physical Exam VS:  BP 118/68   Pulse 75   Ht 5' 9 (1.753 m)   Wt (!) 322 lb 6.4 oz (146.2 kg)   SpO2 94%   BMI 47.61 kg/m        Wt Readings from Last 3 Encounters:  11/07/23 (!) 322 lb 6.4 oz (146.2 kg)  10/07/23 (!) 336 lb 3.2 oz (152.5 kg)  09/23/23 (!) 327 lb 3.2 oz (148.4 kg)    GEN: Well nourished, well developed in no acute distress NECK: No JVD; No carotid bruits CARDIAC: RRR, no murmurs, rubs, gallops RESPIRATORY:  Clear to auscultation without rales, wheezing or rhonchi  ABDOMEN: Soft, non-tender, non-distended EXTREMITIES:  No edema; No deformity   ASSESSMENT AND PLAN  Elevated LFTs Recent ER visit for abd pain, nausea and vomiting found to have elevated LFTS and WBC, possible hepatitis/inflammation/infection. She is on monjouro. Further work-up per PCP and GI. I will stop Lipitor.   1st degree AV block EKG shows NSR,75bpm,  PRI , which is stable.  Stop atenolol  and start Toprol 25 mg daily.  EKG at follow-up.  HFimpEF Patient is euvolemic on exam.  I will stop atenolol  and start Toprol as above.  Continue losartan , Jardiance , and Lasix .  She takes Lasix  40 mg twice daily.  HTN Blood pressure today is good.  Continue amlodipine  5 mg daily, losartan  25 mg daily.  Change atenolol  to Toprol as above and re-check at follow-up.  HLD LDL 86. Hold statin for elevated LFTs as above. Can revisit at follow-up.   Obesity Has been on Mounjaro, however given acute liver  issue may have to consider discontinuing.  Will defer to PCP.  Recommend continuation of healthy lifestyle.       Dispo: Follow-up in 6 months  Signed, Cono Gebhard VEAR Fishman, PA-C

## 2023-11-07 ENCOUNTER — Encounter: Payer: Self-pay | Admitting: Podiatry

## 2023-11-07 ENCOUNTER — Ambulatory Visit: Attending: Medical | Admitting: Medical

## 2023-11-07 ENCOUNTER — Encounter: Payer: Self-pay | Admitting: Medical

## 2023-11-07 ENCOUNTER — Ambulatory Visit: Admitting: Podiatry

## 2023-11-07 VITALS — BP 118/68 | HR 75 | Ht 69.0 in | Wt 322.4 lb

## 2023-11-07 DIAGNOSIS — E1142 Type 2 diabetes mellitus with diabetic polyneuropathy: Secondary | ICD-10-CM

## 2023-11-07 DIAGNOSIS — I10 Essential (primary) hypertension: Secondary | ICD-10-CM | POA: Insufficient documentation

## 2023-11-07 DIAGNOSIS — M79676 Pain in unspecified toe(s): Secondary | ICD-10-CM | POA: Diagnosis not present

## 2023-11-07 DIAGNOSIS — B351 Tinea unguium: Secondary | ICD-10-CM

## 2023-11-07 DIAGNOSIS — R7989 Other specified abnormal findings of blood chemistry: Secondary | ICD-10-CM | POA: Diagnosis not present

## 2023-11-07 DIAGNOSIS — E782 Mixed hyperlipidemia: Secondary | ICD-10-CM | POA: Diagnosis not present

## 2023-11-07 DIAGNOSIS — I5020 Unspecified systolic (congestive) heart failure: Secondary | ICD-10-CM | POA: Insufficient documentation

## 2023-11-07 DIAGNOSIS — I440 Atrioventricular block, first degree: Secondary | ICD-10-CM | POA: Insufficient documentation

## 2023-11-07 MED ORDER — METOPROLOL SUCCINATE ER 25 MG PO TB24: 25.0000 mg | ORAL_TABLET | Freq: Every day | ORAL | 3 refills | Status: AC

## 2023-11-07 NOTE — Patient Instructions (Addendum)
 Medication Instructions:  Your physician recommends the following medication changes.  START TAKING: Atorvastatin   Atenolol    START TAKING: Metoprolol 25 mg by mouth daily   *If you need a refill on your cardiac medications before your next appointment, please call your pharmacy*  Lab Work: No labs ordered today    Testing/Procedures: No test ordered today   Follow-Up: At [facility], you and your health needs are our priority.  As part of our continuing mission to provide you with exceptional heart care, our providers are all part of one team.  This team includes your primary Cardiologist (physician) and Advanced Practice Providers or APPs (Physician Assistants and Nurse Practitioners) who all work together to provide you with the care you need, when you need it.  Your next appointment:   6 month(s)  Provider:   Lonni Hanson, MD or Cadence Franchester, PA-C

## 2023-11-08 DIAGNOSIS — R109 Unspecified abdominal pain: Secondary | ICD-10-CM | POA: Diagnosis not present

## 2023-11-10 NOTE — Progress Notes (Signed)
  Subjective:  Patient ID: Sara Zhang, female    DOB: 1970/06/24,  MRN: [id number]  Sara Zhang presents to clinic today for preventative diabetic foot care for painful mycotic toenails x 10 which interfere with daily activities. Pain is relieved with periodic professional debridement.  Patient states she has heel pain of both feet. Chief Complaint  Patient presents with   Toe Pain    Diabetic foot care. A1c 7.3. NP Cannady at [facility] is her PCP   New problem(s): None.   PCP is Valerio Moris T, NP. Sara Zhang 09/23/2023.  Allergies  Allergen Reactions   Lisinopril  Cough    Review of Systems: Negative except as noted in the HPI.  Objective:  There were no vitals filed for this visit. Sara Zhang is a pleasant 53 y.o. female morbidly obese in NAD. AAO x 3.   Vascular Examination: Capillary refill time immediate b/l. Palpable pedal pulses. Pedal hair present b/l. No pain with calf compression b/l. Skin temperature gradient WNL b/l. No cyanosis or clubbing b/l. No ischemia or gangrene noted b/l.   Neurological Examination: Pt has subjective symptoms of neuropathy. Sensation grossly intact b/l with 10 gram monofilament.   Dermatological Examination: No open wounds. No interdigital macerations.   Toenails 1-5 b/l thick, discolored, elongated with subungual debris and pain on dorsal palpation.  Skin of both heels dry.  Evidence of partial matrixectomy both borders of right hallux.  Musculoskeletal Examination: Normal muscle strength 5/5 to all lower extremity muscle groups bilaterally. No pain, crepitus or joint limitation noted with ROM b/l LE. Pes planus b/l. Pain on palpation plantar heel pad b/l. Patient ambulates independently without assistive aids.  Radiographs: None  Assessment/Plan: 1. Pain due to onychomycosis of toenail   2. Diabetic polyneuropathy associated with type 2 diabetes mellitus [facility])     Consent given for treatment. Patient examined. All patient's  and/or POA's questions/concerns addressed on today's visit. Mycotic toenails 1-5 debrided in length and girth without incident. Continue foot and shoe inspections daily. Patient advised to apply Bag Balm moisturizer to dry skin of both heels. Monitor blood glucose per PCP/Endocrinologist's recommendations.Continue soft, supportive shoe gear daily. Report any pedal injuries to medical professional. Call office if there are any quesitons/concerns. -Patient scheduled to see Dr. Verta in 1 week for bilateral heel pain. -Patient/POA to call should there be question/concern in the interim.   Return in about 3 months (around 02/07/2024).  Delon LITTIE Merlin, DPM      [facility] LOCATION: [address], [place] [address]                   Office [phone]   [facility] LOCATION: [address] [place], [place] [address] Office [phone]

## 2023-11-12 ENCOUNTER — Ambulatory Visit (INDEPENDENT_AMBULATORY_CARE_PROVIDER_SITE_OTHER): Admitting: Podiatry

## 2023-11-12 ENCOUNTER — Ambulatory Visit (INDEPENDENT_AMBULATORY_CARE_PROVIDER_SITE_OTHER)

## 2023-11-12 DIAGNOSIS — M722 Plantar fascial fibromatosis: Secondary | ICD-10-CM

## 2023-11-12 MED ORDER — MELOXICAM 15 MG PO TABS: 15.0000 mg | ORAL_TABLET | Freq: Every day | ORAL | 3 refills | Status: AC

## 2023-11-12 MED ORDER — METHYLPREDNISOLONE 4 MG PO TBPK: ORAL_TABLET | ORAL | 0 refills | Status: DC

## 2023-11-12 MED ORDER — TRIAMCINOLONE ACETONIDE 40 MG/ML IJ SUSP: 20.0000 mg | Freq: Once | INTRAMUSCULAR | Status: AC

## 2023-11-12 NOTE — Progress Notes (Signed)
 She presents today left heel pain.  She states this been going on for 2 to 3 weeks now and really does not recall any initiating incident.  She states that hurts right here as she points to the plantar medial left heel.  Objective: Vital signs are stable alert and oriented x 3.  Pulses are palpable.  She has moderate to severe pain on palpation medial calcaneal tubercle of her left heel.  Radiographs taken today demonstrate posterior and plantar calcaneal heel spurs.  Soft tissue increase in density is noted at the plantar fascial calcaneal insertion site.  Assessment: Plantar fasciitis left.  Plan: Started her on methylprednisolone .  This will be followed by meloxicam.  I injected her left heel today with 20 mg of Kenalog  and 5 mg of Marcaine to the point of maximal tenderness.  Discussed appropriate shoe gear.

## 2023-11-13 ENCOUNTER — Inpatient Hospital Stay (HOSPITAL_COMMUNITY)
Admission: AD | Admit: 2023-11-13 | Discharge: 2023-11-16 | DRG: 445 | Disposition: A | Discharge status: Home / Self Care | Source: Other Acute Inpatient Hospital | Attending: Internal Medicine | Admitting: Internal Medicine

## 2023-11-13 ENCOUNTER — Emergency Department

## 2023-11-13 ENCOUNTER — Encounter (HOSPITAL_COMMUNITY): Payer: Self-pay | Admitting: Hospitalist

## 2023-11-13 ENCOUNTER — Encounter (HOSPITAL_COMMUNITY): Payer: Self-pay

## 2023-11-13 ENCOUNTER — Other Ambulatory Visit: Payer: Self-pay

## 2023-11-13 ENCOUNTER — Inpatient Hospital Stay: Admitting: Nurse Practitioner

## 2023-11-13 ENCOUNTER — Emergency Department
Admission: EM | Admit: 2023-11-13 | Discharge: 2023-11-13 | Disposition: A | Discharge status: Other Acute Inpatient Hospital | Attending: Emergency Medicine | Admitting: Emergency Medicine

## 2023-11-13 DIAGNOSIS — Z79899 Other long term (current) drug therapy: Secondary | ICD-10-CM | POA: Diagnosis not present

## 2023-11-13 DIAGNOSIS — Z8616 Personal history of COVID-19: Secondary | ICD-10-CM

## 2023-11-13 DIAGNOSIS — R1011 Right upper quadrant pain: Secondary | ICD-10-CM | POA: Diagnosis not present

## 2023-11-13 DIAGNOSIS — Z9049 Acquired absence of other specified parts of digestive tract: Secondary | ICD-10-CM | POA: Diagnosis not present

## 2023-11-13 DIAGNOSIS — R109 Unspecified abdominal pain: Secondary | ICD-10-CM | POA: Diagnosis not present

## 2023-11-13 DIAGNOSIS — E785 Hyperlipidemia, unspecified: Secondary | ICD-10-CM | POA: Diagnosis present

## 2023-11-13 DIAGNOSIS — E1165 Type 2 diabetes mellitus with hyperglycemia: Secondary | ICD-10-CM | POA: Diagnosis present

## 2023-11-13 DIAGNOSIS — R1013 Epigastric pain: Secondary | ICD-10-CM | POA: Diagnosis not present

## 2023-11-13 DIAGNOSIS — I5032 Chronic diastolic (congestive) heart failure: Secondary | ICD-10-CM | POA: Diagnosis present

## 2023-11-13 DIAGNOSIS — E1122 Type 2 diabetes mellitus with diabetic chronic kidney disease: Secondary | ICD-10-CM | POA: Diagnosis present

## 2023-11-13 DIAGNOSIS — N182 Chronic kidney disease, stage 2 (mild): Secondary | ICD-10-CM | POA: Diagnosis present

## 2023-11-13 DIAGNOSIS — I509 Heart failure, unspecified: Secondary | ICD-10-CM | POA: Diagnosis not present

## 2023-11-13 DIAGNOSIS — Z7984 Long term (current) use of oral hypoglycemic drugs: Secondary | ICD-10-CM

## 2023-11-13 DIAGNOSIS — N183 Chronic kidney disease, stage 3 unspecified: Secondary | ICD-10-CM | POA: Diagnosis not present

## 2023-11-13 DIAGNOSIS — R7989 Other specified abnormal findings of blood chemistry: Secondary | ICD-10-CM | POA: Diagnosis not present

## 2023-11-13 DIAGNOSIS — K8050 Calculus of bile duct without cholangitis or cholecystitis without obstruction: Principal | ICD-10-CM | POA: Diagnosis present

## 2023-11-13 DIAGNOSIS — G4730 Sleep apnea, unspecified: Secondary | ICD-10-CM | POA: Diagnosis present

## 2023-11-13 DIAGNOSIS — Z833 Family history of diabetes mellitus: Secondary | ICD-10-CM | POA: Diagnosis not present

## 2023-11-13 DIAGNOSIS — Z794 Long term (current) use of insulin: Secondary | ICD-10-CM | POA: Diagnosis not present

## 2023-11-13 DIAGNOSIS — N189 Chronic kidney disease, unspecified: Secondary | ICD-10-CM | POA: Diagnosis not present

## 2023-11-13 DIAGNOSIS — Z7985 Long-term (current) use of injectable non-insulin antidiabetic drugs: Secondary | ICD-10-CM | POA: Diagnosis not present

## 2023-11-13 DIAGNOSIS — Z9889 Other specified postprocedural states: Secondary | ICD-10-CM | POA: Diagnosis not present

## 2023-11-13 DIAGNOSIS — N281 Cyst of kidney, acquired: Secondary | ICD-10-CM | POA: Diagnosis not present

## 2023-11-13 DIAGNOSIS — Z8249 Family history of ischemic heart disease and other diseases of the circulatory system: Secondary | ICD-10-CM | POA: Diagnosis not present

## 2023-11-13 DIAGNOSIS — I5089 Other heart failure: Secondary | ICD-10-CM | POA: Diagnosis not present

## 2023-11-13 DIAGNOSIS — I130 Hypertensive heart and chronic kidney disease with heart failure and stage 1 through stage 4 chronic kidney disease, or unspecified chronic kidney disease: Secondary | ICD-10-CM | POA: Diagnosis not present

## 2023-11-13 DIAGNOSIS — R531 Weakness: Secondary | ICD-10-CM | POA: Diagnosis not present

## 2023-11-13 DIAGNOSIS — N83291 Other ovarian cyst, right side: Secondary | ICD-10-CM | POA: Diagnosis not present

## 2023-11-13 DIAGNOSIS — Z791 Long term (current) use of non-steroidal anti-inflammatories (NSAID): Secondary | ICD-10-CM

## 2023-11-13 DIAGNOSIS — E559 Vitamin D deficiency, unspecified: Secondary | ICD-10-CM | POA: Diagnosis present

## 2023-11-13 DIAGNOSIS — Z9981 Dependence on supplemental oxygen: Secondary | ICD-10-CM | POA: Diagnosis not present

## 2023-11-13 DIAGNOSIS — Z83438 Family history of other disorder of lipoprotein metabolism and other lipidemia: Secondary | ICD-10-CM

## 2023-11-13 DIAGNOSIS — K8051 Calculus of bile duct without cholangitis or cholecystitis with obstruction: Secondary | ICD-10-CM | POA: Diagnosis not present

## 2023-11-13 DIAGNOSIS — Z713 Dietary counseling and surveillance: Secondary | ICD-10-CM

## 2023-11-13 DIAGNOSIS — I517 Cardiomegaly: Secondary | ICD-10-CM | POA: Diagnosis not present

## 2023-11-13 DIAGNOSIS — Z6841 Body Mass Index (BMI) 40.0 and over, adult: Secondary | ICD-10-CM | POA: Diagnosis not present

## 2023-11-13 DIAGNOSIS — R1010 Upper abdominal pain, unspecified: Secondary | ICD-10-CM

## 2023-11-13 DIAGNOSIS — R748 Abnormal levels of other serum enzymes: Secondary | ICD-10-CM | POA: Diagnosis not present

## 2023-11-13 DIAGNOSIS — Z59868 Other specified financial insecurity: Secondary | ICD-10-CM

## 2023-11-13 DIAGNOSIS — R079 Chest pain, unspecified: Secondary | ICD-10-CM | POA: Diagnosis not present

## 2023-11-13 DIAGNOSIS — Z975 Presence of (intrauterine) contraceptive device: Secondary | ICD-10-CM

## 2023-11-13 DIAGNOSIS — E860 Dehydration: Secondary | ICD-10-CM | POA: Insufficient documentation

## 2023-11-13 DIAGNOSIS — K838 Other specified diseases of biliary tract: Secondary | ICD-10-CM | POA: Diagnosis not present

## 2023-11-13 DIAGNOSIS — Z743 Need for continuous supervision: Secondary | ICD-10-CM | POA: Diagnosis not present

## 2023-11-13 DIAGNOSIS — L03116 Cellulitis of left lower limb: Principal | ICD-10-CM | POA: Diagnosis present

## 2023-11-13 DIAGNOSIS — R0989 Other specified symptoms and signs involving the circulatory and respiratory systems: Secondary | ICD-10-CM | POA: Diagnosis not present

## 2023-11-13 DIAGNOSIS — R112 Nausea with vomiting, unspecified: Secondary | ICD-10-CM

## 2023-11-13 LAB — CREATININE, SERUM
Creatinine, Ser: 1.32 mg/dL — ABNORMAL HIGH (ref 0.44–1.00)
GFR, Estimated: 48 mL/min — ABNORMAL LOW (ref >60)

## 2023-11-13 LAB — CBC
HCT: 41.7 % (ref 36.0–46.0)
HCT: 43 % (ref 36.0–46.0)
Hemoglobin: 13.1 g/dL (ref 12.0–15.0)
Hemoglobin: 13 g/dL (ref 12.0–15.0)
MCH: 26.7 pg (ref 26.0–34.0)
MCH: 26.4 pg (ref 26.0–34.0)
MCHC: 31.4 g/dL (ref 30.0–36.0)
MCHC: 30.2 g/dL (ref 30.0–36.0)
MCV: 85.1 fL (ref 80.0–100.0)
MCV: 87.2 fL (ref 80.0–100.0)
Platelets: 406 K/uL — ABNORMAL HIGH (ref 150–400)
Platelets: 399 K/uL (ref 150–400)
RBC: 4.9 MIL/uL (ref 3.87–5.11)
RBC: 4.93 MIL/uL (ref 3.87–5.11)
RDW: 15.8 % — ABNORMAL HIGH (ref 11.5–15.5)
RDW: 16.2 % — ABNORMAL HIGH (ref 11.5–15.5)
WBC: 10.4 K/uL (ref 4.0–10.5)
WBC: 10.9 K/uL — ABNORMAL HIGH (ref 4.0–10.5)
nRBC: 0 % (ref 0.0–0.2)
nRBC: 0 % (ref 0.0–0.2)

## 2023-11-13 LAB — HIV ANTIBODY (ROUTINE TESTING W REFLEX): HIV Screen 4th Generation wRfx: NONREACTIVE

## 2023-11-13 LAB — URINALYSIS, ROUTINE W REFLEX MICROSCOPIC
Bilirubin Urine: NEGATIVE
Glucose, UA: >=500 mg/dL — AB
Ketones, ur: 20 mg/dL — AB
Leukocytes,Ua: NEGATIVE
Nitrite: NEGATIVE
Protein, ur: >=300 mg/dL — AB
Specific Gravity, Urine: 1.039 — ABNORMAL HIGH (ref 1.005–1.030)
pH: 5 (ref 5.0–8.0)

## 2023-11-13 LAB — COMPREHENSIVE METABOLIC PANEL WITH GFR
ALT: 116 U/L — ABNORMAL HIGH (ref 0–44)
AST: 86 U/L — ABNORMAL HIGH (ref 15–41)
Albumin: 3.1 g/dL — ABNORMAL LOW (ref 3.5–5.0)
Alkaline Phosphatase: 488 U/L — ABNORMAL HIGH (ref 38–126)
Anion gap: 11 (ref 5–15)
BUN: 12 mg/dL (ref 6–20)
CO2: 24 mmol/L (ref 22–32)
Calcium: 8.4 mg/dL — ABNORMAL LOW (ref 8.9–10.3)
Chloride: 101 mmol/L (ref 98–111)
Creatinine, Ser: 0.86 mg/dL (ref 0.44–1.00)
GFR, Estimated: >60 mL/min (ref >60)
Glucose, Bld: 180 mg/dL — ABNORMAL HIGH (ref 70–99)
Potassium: 3.6 mmol/L (ref 3.5–5.1)
Sodium: 136 mmol/L (ref 135–145)
Total Bilirubin: 3.3 mg/dL — ABNORMAL HIGH (ref 0.0–1.2)
Total Protein: 8.1 g/dL (ref 6.5–8.1)

## 2023-11-13 LAB — GLUCOSE, CAPILLARY
Glucose-Capillary: 115 mg/dL — ABNORMAL HIGH (ref 70–99)
Glucose-Capillary: 189 mg/dL — ABNORMAL HIGH (ref 70–99)

## 2023-11-13 LAB — LIPASE, BLOOD: Lipase: 83 U/L — ABNORMAL HIGH (ref 11–51)

## 2023-11-13 LAB — TROPONIN I (HIGH SENSITIVITY): Troponin I (High Sensitivity): 12 ng/L (ref <18)

## 2023-11-13 LAB — HEMOGLOBIN A1C
Hgb A1c MFr Bld: 8 % — ABNORMAL HIGH (ref 4.8–5.6)
Mean Plasma Glucose: 182.9 mg/dL

## 2023-11-13 MED ORDER — AMLODIPINE BESYLATE 5 MG PO TABS
5.0000 mg | ORAL_TABLET | Freq: Every day | ORAL | Status: DC
  Administered 2023-11-13 – 2023-11-16 (×3): 5 mg via ORAL
  Filled 2023-11-13 (×4): qty 1

## 2023-11-13 MED ORDER — MORPHINE SULFATE (PF) 4 MG/ML IV SOLN
4.0000 mg | INTRAVENOUS | Status: DC | PRN
  Administered 2023-11-13: 4 mg via INTRAVENOUS
  Filled 2023-11-13: qty 1

## 2023-11-13 MED ORDER — MORPHINE SULFATE (PF) 4 MG/ML IV SOLN
4.0000 mg | Freq: Once | INTRAVENOUS | Status: AC
  Administered 2023-11-13: 4 mg via INTRAVENOUS
  Filled 2023-11-13: qty 1

## 2023-11-13 MED ORDER — SODIUM CHLORIDE 0.9 % IV SOLN: Freq: Once | INTRAVENOUS | Status: AC

## 2023-11-13 MED ORDER — OXYCODONE HCL 5 MG PO TABS: 5.0000 mg | ORAL_TABLET | ORAL | Status: DC | PRN

## 2023-11-13 MED ORDER — ONDANSETRON 4 MG PO TBDP
4.0000 mg | ORAL_TABLET | Freq: Once | ORAL | Status: AC
Start: 2023-11-13 — End: 2023-11-13
  Administered 2023-11-13: 4 mg via ORAL
  Filled 2023-11-13: qty 1

## 2023-11-13 MED ORDER — INSULIN GLARGINE 100 UNIT/ML ~~LOC~~ SOLN
25.0000 [IU] | Freq: Every day | SUBCUTANEOUS | Status: DC
  Administered 2023-11-13 – 2023-11-15 (×3): 25 [IU] via SUBCUTANEOUS
  Filled 2023-11-13 (×6): qty 0.25

## 2023-11-13 MED ORDER — SODIUM CHLORIDE 0.9 % IV SOLN
1.5000 g | Freq: Four times a day (QID) | INTRAVENOUS | Status: DC
  Administered 2023-11-13 – 2023-11-16 (×11): 1.5 g via INTRAVENOUS
  Filled 2023-11-13 (×14): qty 4

## 2023-11-13 MED ORDER — PIPERACILLIN-TAZOBACTAM 3.375 G IVPB 30 MIN
3.3750 g | Freq: Once | INTRAVENOUS | Status: AC
  Administered 2023-11-13: 3.375 g via INTRAVENOUS
  Filled 2023-11-13 (×2): qty 50

## 2023-11-13 MED ORDER — GADOBUTROL 1 MMOL/ML IV SOLN
10.0000 mL | Freq: Once | INTRAVENOUS | Status: AC | PRN
Start: 2023-11-13 — End: 2023-11-13
  Administered 2023-11-13: 10 mL via INTRAVENOUS

## 2023-11-13 MED ORDER — METOPROLOL SUCCINATE ER 25 MG PO TB24
25.0000 mg | ORAL_TABLET | Freq: Every day | ORAL | Status: DC
  Administered 2023-11-13 – 2023-11-16 (×3): 25 mg via ORAL
  Filled 2023-11-13 (×4): qty 1

## 2023-11-13 MED ORDER — ONDANSETRON HCL 4 MG/2ML IJ SOLN: 4.0000 mg | Freq: Four times a day (QID) | INTRAMUSCULAR | Status: DC | PRN

## 2023-11-13 MED ORDER — INSULIN ASPART 100 UNIT/ML IJ SOLN
0.0000 [IU] | Freq: Three times a day (TID) | INTRAMUSCULAR | Status: DC
  Administered 2023-11-14: 3 [IU] via SUBCUTANEOUS
  Administered 2023-11-14: 5 [IU] via SUBCUTANEOUS
  Administered 2023-11-16: 3 [IU] via SUBCUTANEOUS

## 2023-11-13 MED ORDER — PANTOPRAZOLE SODIUM 40 MG IV SOLR
40.0000 mg | Freq: Once | INTRAVENOUS | Status: AC
  Administered 2023-11-13: 40 mg via INTRAVENOUS
  Filled 2023-11-13: qty 10

## 2023-11-13 MED ORDER — ENOXAPARIN SODIUM 80 MG/0.8ML IJ SOSY
70.0000 mg | PREFILLED_SYRINGE | INTRAMUSCULAR | Status: DC
  Administered 2023-11-13: 70 mg via SUBCUTANEOUS
  Filled 2023-11-13 (×2): qty 0.8

## 2023-11-13 MED ORDER — INSULIN GLARGINE-YFGN 100 UNIT/ML ~~LOC~~ SOLN: 40.0000 [IU] | Freq: Every day | SUBCUTANEOUS | Status: DC

## 2023-11-13 MED ORDER — ONDANSETRON HCL 4 MG/2ML IJ SOLN
4.0000 mg | Freq: Once | INTRAMUSCULAR | Status: AC
  Administered 2023-11-13: 4 mg via INTRAVENOUS
  Filled 2023-11-13: qty 2

## 2023-11-13 MED ORDER — ACETAMINOPHEN 325 MG PO TABS
650.0000 mg | ORAL_TABLET | Freq: Once | ORAL | Status: AC
  Administered 2023-11-13: 650 mg via ORAL
  Filled 2023-11-13: qty 2

## 2023-11-13 MED ORDER — LOSARTAN POTASSIUM 25 MG PO TABS
25.0000 mg | ORAL_TABLET | Freq: Every day | ORAL | Status: DC
  Administered 2023-11-13 – 2023-11-16 (×3): 25 mg via ORAL
  Filled 2023-11-13 (×4): qty 1

## 2023-11-13 MED ORDER — BASAGLAR KWIKPEN 100 UNIT/ML ~~LOC~~ SOPN: 40.0000 [IU] | PEN_INJECTOR | Freq: Every day | SUBCUTANEOUS | Status: DC

## 2023-11-13 MED ORDER — MORPHINE SULFATE (PF) 2 MG/ML IV SOLN
2.0000 mg | INTRAVENOUS | Status: DC | PRN
  Administered 2023-11-13: 2 mg via INTRAVENOUS
  Filled 2023-11-13 (×2): qty 1

## 2023-11-13 MED ORDER — SODIUM CHLORIDE 0.9 % IV BOLUS
1000.0000 mL | Freq: Once | INTRAVENOUS | Status: AC
  Administered 2023-11-13: 1000 mL via INTRAVENOUS

## 2023-11-13 MED ORDER — ONDANSETRON HCL 4 MG/2ML IJ SOLN
4.0000 mg | Freq: Three times a day (TID) | INTRAMUSCULAR | Status: DC | PRN
  Administered 2023-11-13: 4 mg via INTRAVENOUS
  Filled 2023-11-13: qty 2

## 2023-11-13 NOTE — ED Notes (Signed)
 EMTALA reviewed and transfer signature verified by this RN

## 2023-11-13 NOTE — ED Notes (Signed)
 Pt assisted to the restroom and then back into bed, reattached to CCM and pulse ox. Denies any additional needs at this time, NAD noted, call bell within reach

## 2023-11-13 NOTE — ED Provider Notes (Signed)
 Vitals:   11/13/23 0800 11/13/23 1130  BP: 135/82 122/76  Pulse: 78 74  Resp: 20 18  Temp:  98 F (36.7 C)  SpO2: 99% 96%     Patient resting comfortably seated at bedside.  Understanding agreeable with plan to transfer to [facility] for evaluation with specialist.  She is pleasant, in no distress.  Stable for transfer   Dicky Anes, MD 11/13/23 1235

## 2023-11-13 NOTE — ED Notes (Signed)
 Report received from off going RN, pt sitting up in stretcher, denies any needs at this time, call bell within reach, NAD noted

## 2023-11-13 NOTE — ED Provider Notes (Addendum)
 [facility] Provider Note    Event Date/Time   First MD Initiated Contact with Patient 11/13/23 [phone]     (approximate)   History   Abdominal Pain   HPI  Sara Zhang is a 53 y.o. female   Past medical history of remote cholecystectomy, CHF, CKD, diabetes, hypertension hyperlipidemia presents emergency department with right upper quadrant/epigastric pain for the last 2 weeks associate with nausea and vomiting.  Very poor p.o. intake due to her symptoms as above.  Denies fevers or chills.  No urinary symptoms bowel movements have been normal without GI bleeding, no frank blood or melena.  She was seen last week at [facility] and discharged after unremarkable workup.  She continues to feel poorly and so came to our emergency department for evaluation.  Independent Historian contributed to assessment above: Husband corroborates information above  External Medical Documents Reviewed: Prior hospital notes including an MRCP performed back in 2024 for similar symptoms, as well as the most recent CT scan obtained at outside hospital this last week      Physical Exam   Triage Vital Signs: ED Triage Vitals  Encounter Vitals Group     BP 11/13/23 0224 134/82     Girls Systolic BP Percentile --      Girls Diastolic BP Percentile --      Boys Systolic BP Percentile --      Boys Diastolic BP Percentile --      Pulse Rate 11/13/23 0224 78     Resp 11/13/23 0224 20     Temp 11/13/23 0224 98 F (36.7 C)     Temp src --      SpO2 11/13/23 0224 95 %     Weight 11/13/23 0223 222 lb (100.7 kg)     Height 11/13/23 0223 5' 9 (1.753 m)     Head Circumference --      Peak Flow --      Pain Score 11/13/23 0223 9     Pain Loc --      Pain Education --      Exclude from Growth Chart --     Most recent vital signs: Vitals:   11/13/23 0608 11/13/23 0609  BP: (!) 157/82   Pulse: 76 79  Resp: (!) 22 (!) 23  Temp: 98 F (36.7 C)   SpO2: 97% 97%     General: Awake, no distress.  CV:  Good peripheral perfusion.  Resp:  Normal effort.  Abd:  No distention.  Other:  She appears uncomfortable.  She has epigastric and right upper quadrant pain with palpation but no rigidity or guarding.  She appears slightly dehydrated with dry mucous membranes.  Chest wall without tenderness.  No murmurs to auscultation of the heart, lung sounds clear without focality or wheezing.  No fever, normal vital signs   ED Results / Procedures / Treatments   Labs (all labs ordered are listed, but only abnormal results are displayed) Labs Reviewed  LIPASE, BLOOD - Abnormal; Notable for the following components:      Result Value   Lipase 83 (*)    All other components within normal limits  COMPREHENSIVE METABOLIC PANEL WITH GFR - Abnormal; Notable for the following components:   Glucose, Bld 180 (*)    Calcium  8.4 (*)    Albumin 3.1 (*)    AST 86 (*)    ALT 116 (*)    Alkaline Phosphatase 488 (*)    Total Bilirubin 3.3 (*)  All other components within normal limits  CBC - Abnormal; Notable for the following components:   RDW 15.8 (*)    Platelets 406 (*)    All other components within normal limits  URINALYSIS, ROUTINE W REFLEX MICROSCOPIC - Abnormal; Notable for the following components:   Color, Urine AMBER (*)    APPearance HAZY (*)    Specific Gravity, Urine 1.039 (*)    Glucose, UA >=500 (*)    Hgb urine dipstick SMALL (*)    Ketones, ur 20 (*)    Protein, ur >=300 (*)    Bacteria, UA FEW (*)    All other components within normal limits  TROPONIN I (HIGH SENSITIVITY)     I ordered and reviewed the above labs they are notable for no leukocytosis but does have mild elevation of AST to 86, ALT 116, and also an alk phos of 480 and a total bilirubin increased to 3.3.  RADIOLOGY I independently reviewed and interpreted chest x-ray and see no obvious focality I also reviewed radiologist's formal read.   PROCEDURES:  Critical Care  performed: No  Procedures   MEDICATIONS ORDERED IN ED: Medications  piperacillin-tazobactam (ZOSYN) IVPB 3.375 g (has no administration in time range)  morphine  (PF) 4 MG/ML injection 4 mg (has no administration in time range)  ondansetron  (ZOFRAN ) injection 4 mg (has no administration in time range)  0.9 %  sodium chloride  infusion (has no administration in time range)  ondansetron  (ZOFRAN -ODT) disintegrating tablet 4 mg (4 mg Oral Given 11/13/23 0240)  acetaminophen  (TYLENOL ) tablet 650 mg (650 mg Oral Given 11/13/23 0241)  sodium chloride  0.9 % bolus 1,000 mL (1,000 mLs Intravenous New Bag/Given 11/13/23 0353)  ondansetron  (ZOFRAN ) injection 4 mg (4 mg Intravenous Given 11/13/23 0351)  morphine  (PF) 4 MG/ML injection 4 mg (4 mg Intravenous Given 11/13/23 0351)  pantoprazole  (PROTONIX ) injection 40 mg (40 mg Intravenous Given 11/13/23 0353)  gadobutrol  (GADAVIST ) 1 MMOL/ML injection 10 mL (10 mLs Intravenous Contrast Given 11/13/23 0534)    External physician / consultants:  I spoke with Dr. Aundria of GI regarding care plan for this patient.   IMPRESSION / MDM / ASSESSMENT AND PLAN / ED COURSE  I reviewed the triage vital signs and the nursing notes.                                Patient's presentation is most consistent with acute presentation with potential threat to life or bodily function.  Differential diagnosis includes, but is not limited to, biliary obstruction, hepatitis, viral gastroenteritis, sepsis/GERD/ulcer, intra-abdominal infection or obstruction, ACS   The patient is on the cardiac monitor to evaluate for evidence of arrhythmia and/or significant heart rate changes.  MDM:    Subacute nausea and vomiting and upper abdominal pain which I am concerned may represent a biliary obstruction with some dilation noted on CT scan 1 week ago, status postcholecystectomy, may represent choledocholithiasis or other stricture or mass lesion especially in light of her LFT  abnormalities bilirubinemia.  In consultation with Dr. Aundria, in agreement to proceed with MRCP as diagnostic.  Will medicate with IV pain medications antiemetics and fluids.  I considered ACS but would be very atypical.  Will obtain an EKG and check a troponin.  I considered other intra-abdominal pathologies but given no lower abdominal tenderness and a recent CT scan that showed no other acute abnormalities, I think less likely diverticulitis or appendicitis.  May represent  gastritis or GERD as well.  Will give IV Protonix .  ---  MRCP performed and resulted showing choledocholithiasis.  No evidence of active infection on imaging, patient stable with normal vital signs and no leukocytosis or fever.  Given the obvious obstruction and duration of symptoms now dating back over 1 week, I am going to give a dose of Zosyn as a preventative measure.  Continue to give as needed nausea and pain medications and started on IV maintenance fluid.  Keep NPO.  I informed the patient of these findings as well as the need for ERCP.  Spoke to GI earlier this morning and was informed that our ERCP doctors are on vacation.  Thus I have reached out to [facility] for transfer.  I spoke with their on-call GI who said that they are able to accept the patient but unlikely to perform ERCP until tomorrow, given a full schedule already.  Awaiting callback from hospital medicine who will be the accepting service.  I think ERCP tomorrow is a reasonable approach given that the patient does not appear septic.  Checked with [facility] as well, at capacity.  Patient stable at signout.  Pending transport to [facility].      FINAL CLINICAL IMPRESSION(S) / ED DIAGNOSES   Final diagnoses:  Pain of upper abdomen  Nausea and vomiting, unspecified vomiting type  Dehydration  Choledocholithiasis     Rx / DC Orders   ED Discharge Orders     None        Note:  This document was prepared using Dragon voice recognition  software and may include unintentional dictation errors.    Cyrena Mylar, MD 11/13/23 9346    Cyrena Mylar, MD 11/13/23 9287    Cyrena Mylar, MD 11/13/23 [phone]

## 2023-11-13 NOTE — ED Triage Notes (Signed)
 Pt reports abd pain for the past few weeks, pt was seen at [facility] this past Tuesday for same and is following up with PCP later today. Pt reports she has had n/v. Pt reports she had a normal CT scan on Tuesday and was given meds for nausea, but has had no relief.

## 2023-11-13 NOTE — H&P (Signed)
 History and Physical    Patient: Sara Zhang FMW:[id number] DOB: Oct 05, 1970 DOA: 11/13/2023 DOS: the patient was seen and examined on 11/13/2023 PCP: Valerio Melanie DASEN, NP  Patient coming from: Home  Chief Complaint: No chief complaint on file.  HPI: Sara Zhang is a 53 y.o. female with medical history significant of h/o remote cholecystectomy, HFpEF, HTN, HLD ,and DM2 p/w RUQ pain for 2 weeks c/b n/v and found to have choledocholithiasis on MRCP for which GI was consulted to perform ERCP planned for 10/10.  The patient reported that the symptoms began a couple of weeks ago with episodes of vomiting and abdominal pain. Initially, the symptoms resolved within a few days but recurred shortly thereafter. A third episode prompted the patient to visit the emergency room due to severe pain. During the emergency room visit on the 1st of this month, a CT scan was performed, which showed concerns around the liver but no gallstones. The patient was treated with morphine  for pain and Zofran  for nausea, and was eventually discharged. The patient continued to experience symptoms and went to the emergency room again at 2 AM yesterday. The patient was informed that gallstones were the likely cause. The patient has a history of gallstones, having had fifty stones removed approximately twenty years ago. Despite having the gallbladder removed, the patient is experiencing similar symptoms again.  In the ED, pt AFVSS. Labs notable for alk phos 488, AST 86, ALT 116 and total bilirubin 3.3. CT abd showed choledocholithiasis with distal common bile duct stones (up to 1 cm) causing biliary ductal dilatation, including a markedly dilated common bile duct (up to 1.8 cm) and intrahepatic biliary dilatation, in the post-cholecystectomy setting, as wellas equivocal interstitial pancreatic edema without main pancreatic duct dilatation, mass, peripancreatic fat stranding, or free fluid. EDP consult GI and requested medicine  admission.   Review of Systems: As mentioned in the history of present illness. All other systems reviewed and are negative. Past Medical History:  Diagnosis Date   Abnormal uterine bleeding (AUB) 11/25/2016   Chronic diastolic CHF (congestive heart failure) (HCC)    a. 02/2012 Echo: EF 25-35%, glob HK; b. 06/2016 Echo: EF 55-60%, mild MR, nl PASP.   CKD (chronic kidney disease), stage II    COVID-19    Diabetes mellitus without complication (HCC)    Extreme obesity    Hyperlipidemia    Hypertension    Hypoxia    Microcytic anemia    Pneumonia    Pneumonia    Sleep apnea    Status post endometrial ablation 06/20/2016   2015 hysteroscopy/D&C with NovaSure endometrial ablation; pathology-benign endometrial polyps   Thickened endometrium 06/20/2016   Ultrasound-22.3 mm endometrial stripe, heterogenous   Vaginal bleeding    a. 01/2017 s/p hyteroscopy and polypectomy [facility]).   Vitamin D  deficiency    Past Surgical History:  Procedure Laterality Date   CESAREAN SECTION     CHOLECYSTECTOMY     DILATION AND CURETTAGE, DIAGNOSTIC / THERAPEUTIC     ERCP N/A 06/27/2022   Procedure: ENDOSCOPIC RETROGRADE CHOLANGIOPANCREATOGRAPHY (ERCP);  Surgeon: Jinny Carmine, MD;  Location: [facility] ENDOSCOPY;  Service: Endoscopy;  Laterality: N/A;   TONSILLECTOMY AND ADENOIDECTOMY     TOOTH EXTRACTION  01/2016   Social History:  reports that she has never smoked. She has never used smokeless tobacco. She reports current alcohol use. She reports that she does not use drugs.  Allergies  Allergen Reactions   Lisinopril  Cough    Family History  Problem  Relation Age of Onset   Diabetes Mother    Hyperlipidemia Mother    Hypertension Mother    Stroke Mother    Arthritis Father    Asthma Father    Diabetes Father    Hypertension Father    Hypertension Brother    Diabetes Brother    Diabetes Maternal Grandmother    Hypertension Maternal Grandmother    Diabetes Maternal Grandfather    Hypertension  Maternal Grandfather    Diabetes Paternal Grandmother    Hypertension Paternal Grandmother    Heart attack Maternal Uncle    Breast cancer Neg Hx     Prior to Admission medications   Medication Sig Start Date End Date Taking? Authorizing Provider  albuterol  (VENTOLIN  HFA) 108 (90 Base) MCG/ACT inhaler Inhale 2 puffs into the lungs every 6 (six) hours as needed for wheezing or shortness of breath. 06/12/23   Cannady, Jolene T, NP  amLODipine  (NORVASC ) 5 MG tablet TAKE 1 TABLET(5 MG) BY MOUTH DAILY 03/12/23   Cannady, Jolene T, NP  Blood Glucose Monitoring Suppl (CONTOUR NEXT MONITOR) w/Device KIT To check blood sugar 4 times daily. 12/22/19   Cannady, Jolene T, NP  Calcium  Carbonate-Vitamin D  (OYSTER SHELL CALCIUM  500 + D) 500-125 MG-UNIT TABS Take 1 tablet by mouth daily.     [provider]  Cholecalciferol  1.25 MG (50000 UT) TABS Take 1 tablet by mouth once a week. 03/13/23   Cannady, Jolene T, NP  ferrous sulfate  325 (65 FE) MG tablet Take 325 mg by mouth daily with breakfast.    [provider]  furosemide  (LASIX ) 40 MG tablet TAKE 1 TABLET(40 MG) BY MOUTH TWICE DAILY 05/09/23   End, Lonni, MD  glucose blood (CONTOUR NEXT TEST) test strip TEST BLOOD SUGAR ONCE DAILY 01/09/18   Cannady, Jolene T, NP  Insulin  Glargine (BASAGLAR  KWIKPEN) 100 UNIT/ML Inject 52 Units into the skin at bedtime. Patient taking differently: Inject 52 Units into the skin at bedtime. 48-52 units qhs 07/30/21   [provider]  Insulin  Pen Needle 32G X 4 MM MISC 1 Units by Does not apply route every morning. Pen needles 08/21/16   Daphane Rosella, NP  JARDIANCE  25 MG TABS tablet TAKE 1 TABLET(25 MG) BY MOUTH DAILY 09/23/23   Cannady, Jolene T, NP  Lancets (ONETOUCH ULTRASOFT) lancets Use as instructed 06/05/20   Cannady, Jolene T, NP  levonorgestrel  (MIRENA ) 20 MCG/24HR IUD 1 each by Intrauterine route once.     [provider]  losartan  (COZAAR ) 25 MG tablet Take 1 tablet (25 mg total) by  mouth daily. OFFICE VISIT NEEDED FOR ADDITIONAL REFILLS 09/23/23   Cannady, Jolene T, NP  meloxicam (MOBIC) 15 MG tablet Take 1 tablet (15 mg total) by mouth daily. 11/12/23   Hyatt, Max T, DPM  methylPREDNISolone  (MEDROL  DOSEPAK) 4 MG TBPK tablet 6 day dose pack - take as directed 11/12/23   Hyatt, Max T, DPM  metoprolol succinate (TOPROL-XL) 25 MG 24 hr tablet Take 1 tablet (25 mg total) by mouth daily. Take with or immediately following a meal. 11/07/23   Furth, Cadence H, PA-C  OXYGEN Inhale 2 L into the lungs daily.    [provider]  tirzepatide CLOYDE) 15 MG/0.5ML Pen Inject 15 mg into the skin once a week. 03/24/23   [provider]    Physical Exam: Vitals:   11/13/23 1518 11/13/23 1531  BP: (!) (P) 142/79 (!) 142/79  Pulse: (P) 71 71  Resp: (P) 18 18  Temp: (P)  98.2 F (36.8 C) 98.2 F (36.8 C)  TempSrc: (P) Oral Oral  Weight:  (!) 144.5 kg  Height:  5' 9 (1.753 m)   General: Alert, oriented x3, resting comfortably in no acute distress Respiratory: Lungs clear to auscultation bilaterally with normal respiratory effort; no w/r/r Cardiovascular: Regular rate and rhythm w/o m/r/g Abdomen: Soft, nontender, nondistended. Positive bowel sounds   Data Reviewed:  Lab Results  Component Value Date   WBC 10.4 11/13/2023   HGB 13.1 11/13/2023   HCT 41.7 11/13/2023   MCV 85.1 11/13/2023   PLT 406 (H) 11/13/2023   Lab Results  Component Value Date   GLUCOSE 180 (H) 11/13/2023   CALCIUM  8.4 (L) 11/13/2023   NA 136 11/13/2023   K 3.6 11/13/2023   CO2 24 11/13/2023   CL 101 11/13/2023   BUN 12 11/13/2023   CREATININE 0.86 11/13/2023   Lab Results  Component Value Date   ALT 116 (H) 11/13/2023   AST 86 (H) 11/13/2023   ALKPHOS 488 (H) 11/13/2023   BILITOT 3.3 (H) 11/13/2023   No results found for: INR, PROTIME Radiology: DG Chest Port 1 View Result Date: 11/13/2023 EXAM: 1 VIEW XRAY OF THE CHEST 11/13/2023 05:38:11 AM COMPARISON: CTA chest  06/29/2022 and earlier. CLINICAL HISTORY: 53 year old female. Chest pain, epigastric pain. FINDINGS: LUNGS AND PLEURA: Low but mildly improved lung volumes. No focal pulmonary opacity. No pulmonary edema. No pleural effusion. No pneumothorax. HEART AND MEDIASTINUM: Stable cardiomegaly. No acute abnormality of the mediastinal silhouette. BONES AND SOFT TISSUES: No acute osseous abnormality. IMPRESSION: 1. No acute cardiopulmonary process. 2. Stable cardiomegaly. Electronically signed by: Helayne Hurst MD 11/13/2023 06:16 AM EDT RP Workstation: [id number]   MR ABDOMEN MRCP W WO CONTAST Result Date: 11/13/2023 EXAM: MRCP WITH AND WITHOUT IV CONTRAST 11/13/2023 05:32:56 AM TECHNIQUE: Multisequence, multiplanar magnetic resonance images of the abdomen with and without intravenous contrast. MRCP sequences were performed. 10 mL gadobutrol  (GADAVIST ) 1 MMOL/ML injection 10 mL GADOBUTROL  1 MMOL/ML IV SOLN. COMPARISON: 06/29/2022 CLINICAL HISTORY: RUQ abdominal pain, biliary disease suspected, no prior imaging. Table formatting from the original note was not included.; RUQ abdominal pain, biliary disease suspected; hx of cholecysectomy; prior MRCP 04/2022; 10mL gadavist  LOT [id number] EXP 11/2027 FINDINGS: LIVER: Mild hepatic steatosis. GALLBLADDER AND BILIARY SYSTEM: Status post cholecystectomy. Common bile duct is dilated, measuring up to 1.8 cm. Intrahepatic bile duct dilatation identified. Common bile duct stones are identified within the distal CBD measuring up to 1 cm, image 24/5. SPLEEN: Unremarkable. PANCREAS/PANCREATIC DUCT: Equivocal interstitial edema involving the pancreas. No main duct dilatation or mass. No peripancreatic fat stranding or free fluid. ADRENAL GLANDS: Unremarkable. KIDNEYS: Multiple simple-appearing bilateral kidney cysts are noted. The largest arises from the upper pole of the right kidney, measuring 2.2 cm, image 20/4. These are compatible with Bosniak class 1 cysts requiring no further follow-up.  LYMPH NODES: Prominent upper abdominal lymph nodes identified measuring up to 9 mm. No adenopathy. VASCULATURE: Unremarkable. PERITONEUM: No ascites. ABDOMINAL WALL: No hernia. No mass. BOWEL: Grossly unremarkable. No bowel obstruction. BONES: No acute abnormality or worrisome osseous lesion. SOFT TISSUES: Incidental right ovary cyst appears uniformly T2 hyperintense without internal septation or nodule, measuring 2.3 cm. MISCELLANEOUS: Unremarkable. IMPRESSION: 1. Choledocholithiasis with distal common bile duct stones (up to 1 cm) causing biliary ductal dilatation, including a markedly dilated common bile duct (up to 1.8 cm) and intrahepatic biliary dilatation, in the post-cholecystectomy setting. 2. Equivocal interstitial pancreatic edema without main pancreatic duct dilatation, mass, peripancreatic fat  stranding, or free fluid. 3. Simple-appearing 2.3 cm right ovarian cyst  No follow-up imaging recommended. Electronically signed by: Waddell Calk MD 11/13/2023 06:11 AM EDT RP Workstation: [id number]    Assessment and Plan: 43F h/o remote cholecystectomy, HFpEF, HTN, HLD ,and DM2 p/w RUQ pain for 2 weeks c/b n/v and found to have choledocholithiasis on MRCP for which GI was consulted to perform ERCP planned for 10/10.  Choledocholithiasis -GI consulted; apprec eval/recs -Continue IV Unasyn for now -Continue MIVF: NS at 100cc/h for now -IV morphine  2mg  q3h prn -IV zofran  4mg  q6h prn  HFpEF -HOLD pta torsemide in light of above; will need to resume pta diuretics after ERCP tomorrow -PTA metoprolol XL 25mg  daily and losartan  25mg  daily  HTN -PTA losartan  and amlodipine   DM2 -Semglee  25U nightly (pta Basaglar  52U per report) -SSI TID AC prn -Carb modified diet for now; then NPO at midnight   Advance Care Planning:   Code Status: Full Code   Consults: GI  Family Communication: N/A  Severity of Illness: The appropriate patient status for this patient is INPATIENT. Inpatient status is  judged to be reasonable and necessary in order to provide the required intensity of service to ensure the patient's safety. The patient's presenting symptoms, physical exam findings, and initial radiographic and laboratory data in the context of their chronic comorbidities is felt to place them at high risk for further clinical deterioration. Furthermore, it is not anticipated that the patient will be medically stable for discharge from the hospital within 2 midnights of admission.   * I certify that at the point of admission it is my clinical judgment that the patient will require inpatient hospital care spanning beyond 2 midnights from the point of admission due to high intensity of service, high risk for further deterioration and high frequency of surveillance required.*   ------- I spent 55 minutes reviewing previous notes, at the bedside counseling/discussing the treatment plan, and performing clinical documentation.  Author: Marsha Ada, MD 11/13/2023 4:30 PM  For on call review [email or web address].

## 2023-11-13 NOTE — ED Provider Notes (Signed)
   Patient has MRCP performed and resulted showing choledocholithiasis.   Currently planning to transfer to institution with ERCP available. Dr. Cyrena has spoken with GI at [facility] who has ERCP capabailities, and waiting on hospitalist to call back to accept to [facility]. Alternative is transfer to [facility]. [facility] at capacity.   No present concern for sepsis or cholangitis per outgoing MD.   Dicky Anes, MD 11/13/23 [phone]

## 2023-11-13 NOTE — ED Notes (Signed)
Back from mri

## 2023-11-13 NOTE — ED Notes (Signed)
 Pt resting in stretcher, provided with additional ice chips per request, denies any additional needs, NAD noted, call bell within reach

## 2023-11-13 NOTE — Plan of Care (Addendum)
 Hospital Medicine Transfer Accept Note Patient Name/Age: Sara Zhang / 53 y.o. MRN: [id number] Admission Date: 11/13/2023  Once successfully transferred to the appropriate floor, [facility] will assume care for the patient above.  A/P: 53F h/o remote cholecystectomy, CHF, CKD, diabetes, hypertension hyperlipidemia presents emergency department with right upper quadrant/epigastric pain for the last 2 weeks associate with nausea and vomiting.  Very poor p.o. intake due to her symptoms as above.  Denies fevers or chills.  No urinary symptoms bowel movements have been normal without GI bleeding, no frank blood or melena. MRCP showed choledocolithiasis and CBD dilation. Pt afebrile and hemodynamically stable. On IV Zosyn. GI aware and agrees for ERCP likely on 10/10. Pt accepted to Tele Med bed.   Marsha Ada, MD Attending Physician Division of [facility] Medicine [facility] November 13, 2023 7:22 AM

## 2023-11-13 NOTE — Consult Note (Addendum)
 Consultation  Referring Provider: TRHMoore Primary Care Physician:  Valerio Melanie DASEN, NP Primary Gastroenterologist: Sampson  Reason for Consultation: Acute abdominal pain nausea and vomiting, common bile duct stones  HPI: Sara Zhang is a 53 y.o. female with history of obesity, congestive heart failure with most recent EF of 55 to 60%, diabetes mellitus/insulin -dependent, hypertension, hyperlipidemia, sleep apnea with BiPAP use/O2 2 L.  He is status post remote cholecystectomy about 20 years ago.  Patient had acute onset of her current symptoms about 3 weeks ago at which time she had an episode of epigastric pain with nausea.  She says symptoms lasted for couple of days and resolved and then would recur and that pattern has continued ever since then.  She had a severe episode on 11/05/2023 that took her to the emergency room in [place].  She did have workup there with labs that showed elevated LFTs with T. bili of 4.2/alk phos 490/AST 87/ALT 141/lipase 31. CT of the abdomen pelvis with contrast there showed pronounced biliary ductal dilation with new portacaval lymphadenopathy as compared to 06/30/2022 without evidence of obstructive lesion.  Could not rule out acute inflammatory process i.e. hepatitis or possibly cholangitis.  Patient's symptoms improved with management in the ER and she was discharged to home with instruction to follow-up with PCP. She says she has continued to have intermittent episodes since then and then last night had another episode which was severe and associated with nausea and vomiting.  She says that she feels the pain high in the epigastrium without radiation into her back or chest and had associated diaphoresis nausea and vomiting. Workup in the ER at [facility] with MRI/MRCP as patient to be status postcholecystectomy, common bile duct dilated at 1.8 cm intrahepatic ductal dilation is identified, common bile duct stones are identified in the distal duct  measuring up to 1 cm, there is some equivocal interstitial edema involving the pancreas no main duct dilation or mass no peripancreatic fat stranding or free fluid, prominent upper abdominal lymph nodes identified up to 9 mm no adenopathy, no ascites  Lab-WBC 10.4/hemoglobin 13.1/hematocrit 41.7 Lipase 83 Potassium 3.6/BUN 12/creatinine 0.86/T. bili 3.3/alk phos 48/AST 86/ALT 116 glucose 180 Troponins negative UA positive ketones, positive protein glucose greater than 500  There is no ERCP availability at [place], [facility] at capacity and patient was transferred here for further management  Patient had undergone previous evaluation in March 2024 when she was admitted at [facility] with abdominal pain pain.  CT showed intrahepatic dilation only of the left liver at that time status post cholecystectomy. MRI/MRCP showed segmental biliary dilation of the left lobe mild left lobe atrophy CBD dilated to 14 mm no obstruction or lesion appreciated at the ampulla right sided intrahepatics within normal limits. LFTs at that time within normal limits.  She ultimately underwent ERCP with Dr. Edith in May 2024 which showed the entire main bile duct to be dilated, previous cholecystectomy.  She underwent sphincterotomy and the duct was swept and was clear. The left intrahepatic was brushed, and cytology was benign/negative for malignancy..  Patient says she had done well since that time until she started having symptoms about 3 weeks ago.  In the background of that she had also recently restarted Mounjaro.  He is comfortable this afternoon pain is not severe though she has been medicated but still quite tender on exam, nausea much improved would like to try something to eat.  She has been afebrile and hemodynamically stable.   Past Medical  History:  Diagnosis Date   Abnormal uterine bleeding (AUB) 11/25/2016   Chronic diastolic CHF (congestive heart failure) (HCC)    a. 02/2012 Echo: EF 25-35%, glob HK; b. 06/2016  Echo: EF 55-60%, mild MR, nl PASP.   CKD (chronic kidney disease), stage II    COVID-19    Diabetes mellitus without complication (HCC)    Extreme obesity    Hyperlipidemia    Hypertension    Hypoxia    Microcytic anemia    Pneumonia    Pneumonia    Sleep apnea    Status post endometrial ablation 06/20/2016   2015 hysteroscopy/D&C with NovaSure endometrial ablation; pathology-benign endometrial polyps   Thickened endometrium 06/20/2016   Ultrasound-22.3 mm endometrial stripe, heterogenous   Vaginal bleeding    a. 01/2017 s/p hyteroscopy and polypectomy [facility]).   Vitamin D  deficiency     Past Surgical History:  Procedure Laterality Date   CESAREAN SECTION     CHOLECYSTECTOMY     DILATION AND CURETTAGE, DIAGNOSTIC / THERAPEUTIC     ERCP N/A 06/27/2022   Procedure: ENDOSCOPIC RETROGRADE CHOLANGIOPANCREATOGRAPHY (ERCP);  Surgeon: Jinny Carmine, MD;  Location: [facility] ENDOSCOPY;  Service: Endoscopy;  Laterality: N/A;   TONSILLECTOMY AND ADENOIDECTOMY     TOOTH EXTRACTION  01/2016    Prior to Admission medications   Medication Sig Start Date End Date Taking? Authorizing Provider  albuterol  (VENTOLIN  HFA) 108 (90 Base) MCG/ACT inhaler Inhale 2 puffs into the lungs every 6 (six) hours as needed for wheezing or shortness of breath. 06/12/23   Cannady, Jolene T, NP  amLODipine  (NORVASC ) 5 MG tablet TAKE 1 TABLET(5 MG) BY MOUTH DAILY 03/12/23   Cannady, Jolene T, NP  Blood Glucose Monitoring Suppl (CONTOUR NEXT MONITOR) w/Device KIT To check blood sugar 4 times daily. 12/22/19   Cannady, Jolene T, NP  Calcium  Carbonate-Vitamin D  (OYSTER SHELL CALCIUM  500 + D) 500-125 MG-UNIT TABS Take 1 tablet by mouth daily.     [provider]  Cholecalciferol  1.25 MG (50000 UT) TABS Take 1 tablet by mouth once a week. 03/13/23   Cannady, Jolene T, NP  ferrous sulfate  325 (65 FE) MG tablet Take 325 mg by mouth daily with breakfast.    [provider]  furosemide  (LASIX ) 40 MG tablet TAKE 1  TABLET(40 MG) BY MOUTH TWICE DAILY 05/09/23   End, Lonni, MD  glucose blood (CONTOUR NEXT TEST) test strip TEST BLOOD SUGAR ONCE DAILY 01/09/18   Cannady, Jolene T, NP  Insulin  Glargine (BASAGLAR  KWIKPEN) 100 UNIT/ML Inject 52 Units into the skin at bedtime. Patient taking differently: Inject 52 Units into the skin at bedtime. 48-52 units qhs 07/30/21   [provider]  Insulin  Pen Needle 32G X 4 MM MISC 1 Units by Does not apply route every morning. Pen needles 08/21/16   Daphane Rosella, NP  JARDIANCE  25 MG TABS tablet TAKE 1 TABLET(25 MG) BY MOUTH DAILY 09/23/23   Cannady, Jolene T, NP  Lancets (ONETOUCH ULTRASOFT) lancets Use as instructed 06/05/20   Cannady, Jolene T, NP  levonorgestrel  (MIRENA ) 20 MCG/24HR IUD 1 each by Intrauterine route once.     [provider]  losartan  (COZAAR ) 25 MG tablet Take 1 tablet (25 mg total) by mouth daily. OFFICE VISIT NEEDED FOR ADDITIONAL REFILLS 09/23/23   Cannady, Jolene T, NP  meloxicam (MOBIC) 15 MG tablet Take 1 tablet (15 mg total) by mouth daily. 11/12/23   Hyatt, Max T, DPM  methylPREDNISolone  (MEDROL  DOSEPAK) 4 MG TBPK tablet 6 day dose  pack - take as directed 11/12/23   Hyatt, Max T, DPM  metoprolol succinate (TOPROL-XL) 25 MG 24 hr tablet Take 1 tablet (25 mg total) by mouth daily. Take with or immediately following a meal. 11/07/23   Furth, Cadence H, PA-C  OXYGEN Inhale 2 L into the lungs daily.    [provider]  tirzepatide CLOYDE) 15 MG/0.5ML Pen Inject 15 mg into the skin once a week. 03/24/23   [provider]    Current Facility-Administered Medications  Medication Dose Route Frequency Provider Last Rate Last Admin   ampicillin-sulbactam (UNASYN) 1.5 g in sodium chloride  0.9 % 100 mL IVPB  1.5 g Intravenous Q6H Esterwood, Amy S, PA-C       ondansetron  (ZOFRAN ) injection 4 mg  4 mg Intravenous Q6H PRN Esterwood, Amy S, PA-C        Allergies as of 11/13/2023 - Review Complete 11/13/2023  Allergen Reaction  Noted   Lisinopril  Cough 03/12/2023    Family History  Problem Relation Age of Onset   Diabetes Mother    Hyperlipidemia Mother    Hypertension Mother    Stroke Mother    Arthritis Father    Asthma Father    Diabetes Father    Hypertension Father    Hypertension Brother    Diabetes Brother    Diabetes Maternal Grandmother    Hypertension Maternal Grandmother    Diabetes Maternal Grandfather    Hypertension Maternal Grandfather    Diabetes Paternal Grandmother    Hypertension Paternal Grandmother    Heart attack Maternal Uncle    Breast cancer Neg Hx     Social History   Socioeconomic History   Marital status: Single    Spouse name: Not on file   Number of children: 1   Years of education: GED   Highest education level: GED or equivalent  Occupational History   Occupation: disability  Tobacco Use   Smoking status: Never   Smokeless tobacco: Never  Vaping Use   Vaping status: Never Used  Substance and Sexual Activity   Alcohol use: Yes    Alcohol/week: 0.0 standard drinks of alcohol    Comment: 1-2 beers/month   Drug use: No   Sexual activity: Yes    Birth control/protection: I.U.D.  Other Topics Concern   Not on file  Social History Narrative   Never married, 1 son.  The patient recently lost an aunt in December 2020 and feels she got C19 related to the amount of family and friends coming in and out to pay respects to her aunt.    Social Drivers of Corporate investment banker Strain: Low Risk  (10/07/2023)   Overall Financial Resource Strain (CARDIA)    Difficulty of Paying Living Expenses: Not very hard  Recent Concern: Financial Resource Strain - Medium Risk (09/21/2023)   Overall Financial Resource Strain (CARDIA)    Difficulty of Paying Living Expenses: Somewhat hard  Food Insecurity: No Food Insecurity (11/13/2023)   Hunger Vital Sign    Worried About Running Out of Food in the Last Year: Never true    Ran Out of Food in the Last Year: Never true   Transportation Needs: No Transportation Needs (11/13/2023)   PRAPARE - Administrator, Civil Service (Medical): No    Lack of Transportation (Non-Medical): No  Physical Activity: Sufficiently Active (10/07/2023)   Exercise Vital Sign    Days of Exercise per Week: 2 days    Minutes of Exercise per Session: 90  min  Recent Concern: Physical Activity - Insufficiently Active (09/21/2023)   Exercise Vital Sign    Days of Exercise per Week: 2 days    Minutes of Exercise per Session: 20 min  Stress: No Stress Concern Present (10/07/2023)   [employer] of Occupational Health - Occupational Stress Questionnaire    Feeling of Stress: Only a little  Social Connections: Moderately Isolated (10/07/2023)   Social Connection and Isolation Panel    Frequency of Communication with Friends and Family: More than three times a week    Frequency of Social Gatherings with Friends and Family: More than three times a week    Attends Religious Services: More than 4 times per year    Active Member of [employer] or Organizations: No    Attends Banker Meetings: Never    Marital Status: Never married  Intimate Partner Violence: Not At Risk (11/13/2023)   Humiliation, Afraid, Rape, and Kick questionnaire    Fear of Current or Ex-Partner: No    Emotionally Abused: No    Physically Abused: No    Sexually Abused: No    Review of Systems: Pertinent positive and negative review of systems were noted in the above HPI section.  All other review of systems was otherwise negative.  Physical Exam: Vital signs in last 24 hours: Temp:  [98 F (36.7 C)-98.2 F (36.8 C)] 98.2 F (36.8 C) (10/09 1531) Pulse Rate:  [71-85] 71 (10/09 1531) Resp:  [18-24] 18 (10/09 1531) BP: (122-157)/(69-90) 142/79 (10/09 1531) SpO2:  [92 %-100 %] 98 % (10/09 1355) Weight:  [100.7 kg-144.5 kg] 144.5 kg (10/09 1531) Last BM Date : 11/12/23 General:   Alert,  Well-developed, well-nourished, obese older African-American  female pleasant and cooperative in NAD Head:  Normocephalic and atraumatic. Eyes:  Sclera icteric   conjunctiva pink. Ears:  Normal auditory acuity. Nose:  No deformity, discharge,  or lesions. Mouth:  No deformity or lesions.   Neck:  Supple; no masses or thyromegaly. Lungs:  Clear throughout to auscultation.   No wheezes, crackles, or rhonchi.  Heart:  Regular rate and rhythm; no murmurs, clicks, rubs,  or gallops. Abdomen:  Soft, obese, she is tender in the epigastrium no guarding or rebound BS active,nonpalp mass or hsm.  Excoriations on the abdominal wall Rectal: Not done Msk:  Symmetrical without gross deformities. . Pulses:  Normal pulses noted. Extremities:  Without clubbing or edema. Neurologic:  Alert and  oriented x4;  grossly normal neurologically. Skin:  Intact without significant lesions or rashes.. Psych:  Alert and cooperative. Normal mood and affect.  Intake/Output from previous day: No intake/output data recorded. Intake/Output this shift: No intake/output data recorded.  Lab Results: Recent Labs    11/13/23 0226  WBC 10.4  HGB 13.1  HCT 41.7  PLT 406*   BMET Recent Labs    11/13/23 0226  NA 136  K 3.6  CL 101  CO2 24  GLUCOSE 180*  BUN 12  CREATININE 0.86  CALCIUM  8.4*   LFT Recent Labs    11/13/23 0226  PROT 8.1  ALBUMIN 3.1*  AST 86*  ALT 116*  ALKPHOS 488*  BILITOT 3.3*   PT/INR No results for input(s): LABPROT, INR in the last 72 hours. Hepatitis Panel No results for input(s): HEPBSAG, HCVAB, HEPAIGM, HEPBIGM in the last 72 hours.    IMPRESSION:  #56 53 year old African-American female with history of remote cholecystectomy, who was previously found to have a dilated bile duct in the spring 2024 though  at that time had normal LFTs.  She ultimately underwent ERCP per Dr. Edith 06/2022 which showed a diffusely dilated common bile duct but no evidence of stricture or other abnormality with bile duct sweeping.  She had  brushings done of the left intrahepatic which were benign. Onset of current symptoms about 3 weeks ago with epigastric pain episodes with nausea and vomiting.  She reports that she would have episodes of pain which would gradually resolve if she would be okay for a day or 2 and then would have another episode.  She had a severe episode on 11/05/2023 as outlined above and was seen in the ER and evaluated at [facility].  She did have significantly dilated LFTs and bile duct dilation on CT-was allowed discharge to home to follow-up with PCP. Symptoms have persisted since that time and she had another severe episode last night that took her to the ER at [facility] where MRI MRCP was done after LFTs again noted to be significantly elevated and found to have a dilated common bile duct at 1.8 cm, there is some intrahepatic ductal dilation and common bile duct stones noted up to 1 cm in the CBD.  Prominent upper abdominal lymph nodes up to 9 mm  She has been afebrile, and pain much better today, nausea has resolved.  Patient transferred to [facility] as no ERCP availability at   #2 insulin -dependent diabetes mellitus #3.  Morbid obesity-Oertli on Mounjaro #4 congestive heart failure with most recent EF 50 to 55% #5.  Chronic kidney disease #6.  Hypertension #7.  Sleep apnea with BiPAP use  Plan; okay for soft bland diet Trend daily labs, check INR Will cover with IV Unasyn Will need ERCP with stone extraction.  We do not have any ERCP availability tomorrow as schedule is completely full.  Patient will be scheduled for procedure with Dr. Abran on Saturday, 11/15/2023.  Procedure has been discussed in detail with the patient at bedside specifically including risks of anesthesia, bleeding, perforation, pancreatitis and procedure failure and she is agreeable to proceed. GI will follow with you   Amy Esterwood PA-C 11/13/2023, 4:09 PM    ATTENDING ADDENDUM 53 year old female with history of CHF  (EF 55%), OSA on BiPAP, diabetes, obesity, presenting with choledocholithiasis.  She has had symptoms ongoing a few weeks now with epigastric pain and nausea.  She was actually seen in the ED in [facility] where she was found to have a bilirubin of 4.2 and an elevated alk phos of 490 with mild transaminitis.  CT scan at that time showed biliary ductal dilation.  She was unfortunately sent home to follow-up with her primary care.  She returned to the ED at [facility] yesterday with recurrent pain,, and vomiting.  Workup there shows bilirubin of 3.3 with mild transaminitis, however imaging with MRCP showed dilated common bile duct to 1.8 cm with intrahepatic ductal dilation and choledocholithiasis measuring up to 1 cm in size.  Some mild inflammatory changes of the pancreas with very mild lipase elevation.  Unfortunately [facility] has no coverage for ERCP this week so she was transferred to [facility] for further management.  She is status post cholecystectomy and has had an ERCP in the past as outlined above.  She states she has been feeling better since she came to the hospital.  She is hemodynamically stable, WBC of 10, placed on antibiotics prophylactically to reduce risk for cholangitis.  Discussed choledocholithiasis with the patient and recommend ERCP to relieve her biliary obstruction  and remove the gallstones.  Discussed what ERCP is and what this entails.  We discussed risks and benefits of it and anesthesia.  She appears stable from hemodynamic perspective and cardiopulmonary perspective.  Unfortunately due to endoscopy schedule and the amount of ERCP is currently going on in the system, suspect she may not have ERCP until Saturday unless there is a cancellation of the schedule sooner.  Continue antibiotics, monitor for any worsening in the interim.  If she has any changes in hemodynamics or concern for cholangitis in the interim please contact us .  Will reassess her tomorrow.  Call with questions,  hopefully will do ERCP in the next 48 hours for her.  I personally saw the patient and performed a substantive portion of this encounter (>50% time spent), including MDM  Marcey Naval, MD [facility] Gastroenterology

## 2023-11-13 NOTE — ED Notes (Signed)
 Pt  going [facility]  [facility]  waiting on  bed

## 2023-11-14 DIAGNOSIS — K8050 Calculus of bile duct without cholangitis or cholecystitis without obstruction: Secondary | ICD-10-CM

## 2023-11-14 DIAGNOSIS — K8051 Calculus of bile duct without cholangitis or cholecystitis with obstruction: Secondary | ICD-10-CM | POA: Diagnosis not present

## 2023-11-14 DIAGNOSIS — R748 Abnormal levels of other serum enzymes: Secondary | ICD-10-CM | POA: Diagnosis not present

## 2023-11-14 LAB — CBC WITH DIFFERENTIAL/PLATELET
Abs Immature Granulocytes: 0.06 K/uL (ref 0.00–0.07)
Basophils Absolute: 0 K/uL (ref 0.0–0.1)
Basophils Relative: 1 %
Eosinophils Absolute: 0.1 K/uL (ref 0.0–0.5)
Eosinophils Relative: 2 %
HCT: 42.5 % (ref 36.0–46.0)
Hemoglobin: 12.7 g/dL (ref 12.0–15.0)
Immature Granulocytes: 1 %
Lymphocytes Relative: 22 %
Lymphs Abs: 1.9 K/uL (ref 0.7–4.0)
MCH: 26.4 pg (ref 26.0–34.0)
MCHC: 29.9 g/dL — ABNORMAL LOW (ref 30.0–36.0)
MCV: 88.4 fL (ref 80.0–100.0)
Monocytes Absolute: 0.8 K/uL (ref 0.1–1.0)
Monocytes Relative: 9 %
Neutro Abs: 5.9 K/uL (ref 1.7–7.7)
Neutrophils Relative %: 65 %
Platelets: 346 K/uL (ref 150–400)
RBC: 4.81 MIL/uL (ref 3.87–5.11)
RDW: 16.1 % — ABNORMAL HIGH (ref 11.5–15.5)
WBC: 8.8 K/uL (ref 4.0–10.5)
nRBC: 0 % (ref 0.0–0.2)

## 2023-11-14 LAB — COMPREHENSIVE METABOLIC PANEL WITH GFR
ALT: 134 U/L — ABNORMAL HIGH (ref 0–44)
AST: 101 U/L — ABNORMAL HIGH (ref 15–41)
Albumin: 2.5 g/dL — ABNORMAL LOW (ref 3.5–5.0)
Alkaline Phosphatase: 471 U/L — ABNORMAL HIGH (ref 38–126)
Anion gap: 11 (ref 5–15)
BUN: 18 mg/dL (ref 6–20)
CO2: 26 mmol/L (ref 22–32)
Calcium: 8.1 mg/dL — ABNORMAL LOW (ref 8.9–10.3)
Chloride: 103 mmol/L (ref 98–111)
Creatinine, Ser: 1.3 mg/dL — ABNORMAL HIGH (ref 0.44–1.00)
GFR, Estimated: 49 mL/min — ABNORMAL LOW (ref >60)
Glucose, Bld: 152 mg/dL — ABNORMAL HIGH (ref 70–99)
Potassium: 3.8 mmol/L (ref 3.5–5.1)
Sodium: 140 mmol/L (ref 135–145)
Total Bilirubin: 1.7 mg/dL — ABNORMAL HIGH (ref 0.0–1.2)
Total Protein: 6.8 g/dL (ref 6.5–8.1)

## 2023-11-14 LAB — GLUCOSE, CAPILLARY
Glucose-Capillary: 113 mg/dL — ABNORMAL HIGH (ref 70–99)
Glucose-Capillary: 163 mg/dL — ABNORMAL HIGH (ref 70–99)
Glucose-Capillary: 216 mg/dL — ABNORMAL HIGH (ref 70–99)
Glucose-Capillary: 129 mg/dL — ABNORMAL HIGH (ref 70–99)

## 2023-11-14 LAB — PROTIME-INR
INR: 1.1 (ref 0.8–1.2)
Prothrombin Time: 14.4 s (ref 11.4–15.2)

## 2023-11-14 NOTE — H&P (View-Only) (Signed)
 Progress Note   Subjective  Patient doing well this AM, eating breakfast. Had some discomfort last night in epigastric area, feels better this AM. Has an appetite.    Objective   Vital signs in last 24 hours: Temp:  [98.2 F (36.8 C)] 98.2 F (36.8 C) (10/10 0500) Pulse Rate:  [68-76] 68 (10/10 0500) Resp:  [18] 18 (10/10 0500) BP: (114-142)/(66-85) 114/85 (10/10 0500) SpO2:  [98 %-99 %] 99 % (10/10 0500) Weight:  [144.5 kg] 144.5 kg (10/09 1531) Last BM Date : 11/12/23 General:    AA female in NAD Neurologic:  Alert and oriented,  grossly normal neurologically. Psych:  Cooperative. Normal mood and affect.  Intake/Output from previous day: No intake/output data recorded. Intake/Output this shift: No intake/output data recorded.  Lab Results: Recent Labs    11/13/23 0226 11/13/23 1845 11/14/23 0241  WBC 10.4 10.9* 8.8  HGB 13.1 13.0 12.7  HCT 41.7 43.0 42.5  PLT 406* 399 346   BMET Recent Labs    11/13/23 0226 11/13/23 1845 11/14/23 0241  NA 136  --  140  K 3.6  --  3.8  CL 101  --  103  CO2 24  --  26  GLUCOSE 180*  --  152*  BUN 12  --  18  CREATININE 0.86 1.32* 1.30*  CALCIUM  8.4*  --  8.1*   LFT Recent Labs    11/14/23 0241  PROT 6.8  ALBUMIN 2.5*  AST 101*  ALT 134*  ALKPHOS 471*  BILITOT 1.7*   PT/INR Recent Labs    11/14/23 0241  LABPROT 14.4  INR 1.1    Studies/Results: DG Chest Port 1 View Result Date: 11/13/2023 EXAM: 1 VIEW XRAY OF THE CHEST 11/13/2023 05:38:11 AM COMPARISON: CTA chest 06/29/2022 and earlier. CLINICAL HISTORY: 53 year old female. Chest pain, epigastric pain. FINDINGS: LUNGS AND PLEURA: Low but mildly improved lung volumes. No focal pulmonary opacity. No pulmonary edema. No pleural effusion. No pneumothorax. HEART AND MEDIASTINUM: Stable cardiomegaly. No acute abnormality of the mediastinal silhouette. BONES AND SOFT TISSUES: No acute osseous abnormality. IMPRESSION: 1. No acute cardiopulmonary process. 2.  Stable cardiomegaly. Electronically signed by: Helayne Hurst MD 11/13/2023 06:16 AM EDT RP Workstation: [id number]   MR ABDOMEN MRCP W WO CONTAST Result Date: 11/13/2023 EXAM: MRCP WITH AND WITHOUT IV CONTRAST 11/13/2023 05:32:56 AM TECHNIQUE: Multisequence, multiplanar magnetic resonance images of the abdomen with and without intravenous contrast. MRCP sequences were performed. 10 mL gadobutrol  (GADAVIST ) 1 MMOL/ML injection 10 mL GADOBUTROL  1 MMOL/ML IV SOLN. COMPARISON: 06/29/2022 CLINICAL HISTORY: RUQ abdominal pain, biliary disease suspected, no prior imaging. Table formatting from the original note was not included.; RUQ abdominal pain, biliary disease suspected; hx of cholecysectomy; prior MRCP 04/2022; 10mL gadavist  LOT [id number] EXP 11/2027 FINDINGS: LIVER: Mild hepatic steatosis. GALLBLADDER AND BILIARY SYSTEM: Status post cholecystectomy. Common bile duct is dilated, measuring up to 1.8 cm. Intrahepatic bile duct dilatation identified. Common bile duct stones are identified within the distal CBD measuring up to 1 cm, image 24/5. SPLEEN: Unremarkable. PANCREAS/PANCREATIC DUCT: Equivocal interstitial edema involving the pancreas. No main duct dilatation or mass. No peripancreatic fat stranding or free fluid. ADRENAL GLANDS: Unremarkable. KIDNEYS: Multiple simple-appearing bilateral kidney cysts are noted. The largest arises from the upper pole of the right kidney, measuring 2.2 cm, image 20/4. These are compatible with Bosniak class 1 cysts requiring no further follow-up. LYMPH NODES: Prominent upper abdominal lymph nodes identified measuring up to 9 mm. No adenopathy. VASCULATURE:  Unremarkable. PERITONEUM: No ascites. ABDOMINAL WALL: No hernia. No mass. BOWEL: Grossly unremarkable. No bowel obstruction. BONES: No acute abnormality or worrisome osseous lesion. SOFT TISSUES: Incidental right ovary cyst appears uniformly T2 hyperintense without internal septation or nodule, measuring 2.3 cm. MISCELLANEOUS:  Unremarkable. IMPRESSION: 1. Choledocholithiasis with distal common bile duct stones (up to 1 cm) causing biliary ductal dilatation, including a markedly dilated common bile duct (up to 1.8 cm) and intrahepatic biliary dilatation, in the post-cholecystectomy setting. 2. Equivocal interstitial pancreatic edema without main pancreatic duct dilatation, mass, peripancreatic fat stranding, or free fluid. 3. Simple-appearing 2.3 cm right ovarian cyst  No follow-up imaging recommended. Electronically signed by: Waddell Calk MD 11/13/2023 06:11 AM EDT RP Workstation: [id number]   DG Foot Complete Left Result Date: 11/12/2023 Please see detailed radiograph report in office note.      Assessment / Plan:    53 y/o female here with the following:  Choledocholithiasis Biliary obstruction  Doing well, on antibiotics prophylactically, afebrile and labs stable. MRCP confirms dilated CBD with choledocholithiasis (despite history of cholecystectomy). ERCP is recommended. Due to endoscopy schedule, there is no room to do her ERCP today, tentatively have this planned for tomorrow. She understands this. Will monitor today, NPO after MN, plan for ERCP tomorrow with Dr. Abran.  I have discussed what ERCP is and what it entails, risks / benefits of the exam and anesthesia. She understands and wishes to proceed.   Call with questions, will follow.  Marcey Naval, MD [facility] Gastroenterology

## 2023-11-14 NOTE — Progress Notes (Signed)
 PROGRESS NOTE  Sara Zhang FMW:[id number] DOB: 1970/08/08 DOA: 11/13/2023 PCP: Valerio Melanie DASEN, NP   LOS: 1 day   Brief Narrative / Interim history: 53 year old female with history of remote cholecystectomy, chronic diastolic CHF, HTN, HLD, DM2 comes into the hospital with right upper quadrant pain for couple of weeks, nausea, vomiting.  MRCP showed choledocholithiasis and GI was consulted and she was admitted to the hospital.  Subjective / 24h Interval events: She is doing a little bit better this morning, denies any significant abdominal pain, nausea or vomiting.  She tells me she thinks that she has had an ERCP in March of this year as an outpatient, but that did not improve her symptoms  Assesement and Plan: Principal problem Choledocholithiasis -based on MRCP, symptoms, LFTs.  GI consulted and following, she is planned for an ERCP whenever she can get a spot in the endoscopy suite, tentatively tomorrow - Continue antibiotics meanwhile  Active problems Chronic diastolic CHF-euvolemic, hold torsemide, resume diuretics following the procedure.  Continue metoprolol  Essential hypertension-continue losartan , metoprolol, she is normotensive  Obesity, morbid-BMI 47.  She would benefit from weight loss  DM2, poorly controlled, with hyperglycemia-continue glargine, sliding scale  Lab Results  Component Value Date   HGBA1C 8.0 (H) 11/13/2023   CBG (last 3)  Recent Labs    11/13/23 1745 11/13/23 2039 11/14/23 0738  GLUCAP 115* 189* 113*    Scheduled Meds:  amLODipine   5 mg Oral Daily   enoxaparin  (LOVENOX ) injection  70 mg Subcutaneous Q24H   insulin  aspart  0-15 Units Subcutaneous TID WC   insulin  glargine  25 Units Subcutaneous QHS   losartan   25 mg Oral Daily   metoprolol succinate  25 mg Oral Daily   Continuous Infusions:  ampicillin-sulbactam (UNASYN) IV 1.5 g (11/14/23 1042)   PRN Meds:.morphine  injection, ondansetron   Current Outpatient Medications   Medication Instructions   albuterol  (VENTOLIN  HFA) 108 (90 Base) MCG/ACT inhaler 2 puffs, Inhalation, Every 6 hours PRN   amLODipine  (NORVASC ) 5 MG tablet TAKE 1 TABLET(5 MG) BY MOUTH DAILY   Basaglar  KwikPen 52 Units, Nightly   Blood Glucose Monitoring Suppl (CONTOUR NEXT MONITOR) w/Device KIT To check blood sugar 4 times daily.   Calcium  Carbonate-Vitamin D  (OYSTER SHELL CALCIUM  500 + D) 500-125 MG-UNIT TABS 1 tablet, Daily   Cholecalciferol  1.25 MG (50000 UT) TABS 1 tablet, Oral, Weekly   ferrous sulfate  325 mg, Daily with breakfast   furosemide  (LASIX ) 40 MG tablet TAKE 1 TABLET(40 MG) BY MOUTH TWICE DAILY   glucose blood (CONTOUR NEXT TEST) test strip TEST BLOOD SUGAR ONCE DAILY   Insulin  Pen Needle 1 Units, Does not apply, Every morning, Pen needles   JARDIANCE  25 MG TABS tablet TAKE 1 TABLET(25 MG) BY MOUTH DAILY   Lancets (ONETOUCH ULTRASOFT) lancets Use as instructed   levonorgestrel  (MIRENA ) 20 MCG/24HR IUD 1 each,  Once   losartan  (COZAAR ) 25 mg, Oral, Daily, OFFICE VISIT NEEDED FOR ADDITIONAL REFILLS   meloxicam (MOBIC) 15 mg, Oral, Daily   methylPREDNISolone  (MEDROL  DOSEPAK) 4 MG TBPK tablet 6 day dose pack - take as directed   metoprolol succinate (TOPROL-XL) 25 mg, Oral, Daily, Take with or immediately following a meal.   Mounjaro 15 mg, Weekly   ondansetron  (ZOFRAN -ODT) 4 mg, Every 8 hours PRN   OXYGEN 2 L, Daily    Diet Orders (From admission, onward)     Start     Ordered   11/15/23 0001  Diet NPO time specified  Diet  effective midnight        11/14/23 0848   11/13/23 1642  Diet Carb Modified Fluid consistency: Thin; Room service appropriate? Yes  Diet effective now       Question Answer Comment  Diet-HS Snack? Nothing   Calorie Level Medium 1600-2000   Fluid consistency: Thin   Room service appropriate? Yes      11/13/23 1642            DVT prophylaxis: SCDs Start: 11/13/23 1633   Lab Results  Component Value Date   PLT 346 11/14/2023      Code  Status: Full Code  Family Communication: No family at bedside  Status is: Inpatient Remains inpatient appropriate because: Severity of illness   Level of care: Med-Surg  Consultants:  GI  Objective: Vitals:   11/13/23 1531 11/13/23 1754 11/14/23 0500 11/14/23 1032  BP: (!) 142/79 125/66 114/85 114/67  Pulse: 71 76 68 72  Resp: 18 18 18 16   Temp: 98.2 F (36.8 C) 98.2 F (36.8 C) 98.2 F (36.8 C) 98 F (36.7 C)  TempSrc: Oral Oral Oral Oral  SpO2:  98% 99% 92%  Weight: (!) 144.5 kg     Height: 5' 9 (1.753 m)      No intake or output data in the 24 hours ending 11/14/23 1053 Wt Readings from Last 3 Encounters:  11/13/23 (!) 144.5 kg  11/13/23 100.7 kg  11/07/23 (!) 146.2 kg    Examination:  Constitutional: NAD Eyes: no scleral icterus ENMT: Mucous membranes are moist.  Neck: normal, supple Respiratory: clear to auscultation bilaterally, no wheezing, no crackles.  Cardiovascular: Regular rate and rhythm, no murmurs / rubs / gallops. No LE edema.  Abdomen: non distended, no tenderness. Bowel sounds positive.  Musculoskeletal: no clubbing / cyanosis.    Data Reviewed: I have independently reviewed following labs and imaging studies   CBC Recent Labs  Lab 11/13/23 0226 11/13/23 1845 11/14/23 0241  WBC 10.4 10.9* 8.8  HGB 13.1 13.0 12.7  HCT 41.7 43.0 42.5  PLT 406* 399 346  MCV 85.1 87.2 88.4  MCH 26.7 26.4 26.4  MCHC 31.4 30.2 29.9*  RDW 15.8* 16.2* 16.1*  LYMPHSABS  --   --  1.9  MONOABS  --   --  0.8  EOSABS  --   --  0.1  BASOSABS  --   --  0.0    Recent Labs  Lab 11/13/23 0226 11/13/23 1845 11/14/23 0241  NA 136  --  140  K 3.6  --  3.8  CL 101  --  103  CO2 24  --  26  GLUCOSE 180*  --  152*  BUN 12  --  18  CREATININE 0.86 1.32* 1.30*  CALCIUM  8.4*  --  8.1*  AST 86*  --  101*  ALT 116*  --  134*  ALKPHOS 488*  --  471*  BILITOT 3.3*  --  1.7*  ALBUMIN 3.1*  --  2.5*  INR  --   --  1.1  HGBA1C  --  8.0*  --      ------------------------------------------------------------------------------------------------------------------ No results for input(s): CHOL, HDL, LDLCALC, TRIG, CHOLHDL, LDLDIRECT in the last 72 hours.  Lab Results  Component Value Date   HGBA1C 8.0 (H) 11/13/2023   ------------------------------------------------------------------------------------------------------------------ No results for input(s): TSH, T4TOTAL, T3FREE, THYROIDAB in the last 72 hours.  Invalid input(s): FREET3  Cardiac Enzymes No results for input(s): CKMB, TROPONINI, MYOGLOBIN in the last 168 hours.  Invalid  input(s): CK ------------------------------------------------------------------------------------------------------------------    Component Value Date/Time   BNP 170.5 (H) 06/29/2022 1008    CBG: Recent Labs  Lab 11/13/23 1745 11/13/23 2039 11/14/23 0738  GLUCAP 115* 189* 113*    No results found for this or any previous visit (from the past 240 hours).   Radiology Studies: No results found.  Nilda Fendt, MD, PhD [facility]  Between 7 am - 7 pm I am available, please contact me via Amion (for emergencies) or Securechat (non urgent messages)  Between 7 pm - 7 am I am not available, please contact night coverage MD/APP via Amion

## 2023-11-14 NOTE — Plan of Care (Signed)
  Problem: Clinical Measurements: Goal: Will remain free from infection Outcome: Progressing   Problem: Activity: Goal: Risk for activity intolerance will decrease Outcome: Progressing   Problem: Coping: Goal: Level of anxiety will decrease Outcome: Progressing   Problem: Safety: Goal: Ability to remain free from injury will improve Outcome: Progressing   Problem: Skin Integrity: Goal: Risk for impaired skin integrity will decrease Outcome: Progressing   Problem: Skin Integrity: Goal: Risk for impaired skin integrity will decrease Outcome: Progressing

## 2023-11-14 NOTE — Progress Notes (Signed)
 Progress Note   Subjective  Patient doing well this AM, eating breakfast. Had some discomfort last night in epigastric area, feels better this AM. Has an appetite.    Objective   Vital signs in last 24 hours: Temp:  [98.2 F (36.8 C)] 98.2 F (36.8 C) (10/10 0500) Pulse Rate:  [68-76] 68 (10/10 0500) Resp:  [18] 18 (10/10 0500) BP: (114-142)/(66-85) 114/85 (10/10 0500) SpO2:  [98 %-99 %] 99 % (10/10 0500) Weight:  [144.5 kg] 144.5 kg (10/09 1531) Last BM Date : 11/12/23 General:    AA female in NAD Neurologic:  Alert and oriented,  grossly normal neurologically. Psych:  Cooperative. Normal mood and affect.  Intake/Output from previous day: No intake/output data recorded. Intake/Output this shift: No intake/output data recorded.  Lab Results: Recent Labs    11/13/23 0226 11/13/23 1845 11/14/23 0241  WBC 10.4 10.9* 8.8  HGB 13.1 13.0 12.7  HCT 41.7 43.0 42.5  PLT 406* 399 346   BMET Recent Labs    11/13/23 0226 11/13/23 1845 11/14/23 0241  NA 136  --  140  K 3.6  --  3.8  CL 101  --  103  CO2 24  --  26  GLUCOSE 180*  --  152*  BUN 12  --  18  CREATININE 0.86 1.32* 1.30*  CALCIUM  8.4*  --  8.1*   LFT Recent Labs    11/14/23 0241  PROT 6.8  ALBUMIN 2.5*  AST 101*  ALT 134*  ALKPHOS 471*  BILITOT 1.7*   PT/INR Recent Labs    11/14/23 0241  LABPROT 14.4  INR 1.1    Studies/Results: DG Chest Port 1 View Result Date: 11/13/2023 EXAM: 1 VIEW XRAY OF THE CHEST 11/13/2023 05:38:11 AM COMPARISON: CTA chest 06/29/2022 and earlier. CLINICAL HISTORY: 53 year old female. Chest pain, epigastric pain. FINDINGS: LUNGS AND PLEURA: Low but mildly improved lung volumes. No focal pulmonary opacity. No pulmonary edema. No pleural effusion. No pneumothorax. HEART AND MEDIASTINUM: Stable cardiomegaly. No acute abnormality of the mediastinal silhouette. BONES AND SOFT TISSUES: No acute osseous abnormality. IMPRESSION: 1. No acute cardiopulmonary process. 2.  Stable cardiomegaly. Electronically signed by: Helayne Hurst MD 11/13/2023 06:16 AM EDT RP Workstation: [id number]   MR ABDOMEN MRCP W WO CONTAST Result Date: 11/13/2023 EXAM: MRCP WITH AND WITHOUT IV CONTRAST 11/13/2023 05:32:56 AM TECHNIQUE: Multisequence, multiplanar magnetic resonance images of the abdomen with and without intravenous contrast. MRCP sequences were performed. 10 mL gadobutrol  (GADAVIST ) 1 MMOL/ML injection 10 mL GADOBUTROL  1 MMOL/ML IV SOLN. COMPARISON: 06/29/2022 CLINICAL HISTORY: RUQ abdominal pain, biliary disease suspected, no prior imaging. Table formatting from the original note was not included.; RUQ abdominal pain, biliary disease suspected; hx of cholecysectomy; prior MRCP 04/2022; 10mL gadavist  LOT [id number] EXP 11/2027 FINDINGS: LIVER: Mild hepatic steatosis. GALLBLADDER AND BILIARY SYSTEM: Status post cholecystectomy. Common bile duct is dilated, measuring up to 1.8 cm. Intrahepatic bile duct dilatation identified. Common bile duct stones are identified within the distal CBD measuring up to 1 cm, image 24/5. SPLEEN: Unremarkable. PANCREAS/PANCREATIC DUCT: Equivocal interstitial edema involving the pancreas. No main duct dilatation or mass. No peripancreatic fat stranding or free fluid. ADRENAL GLANDS: Unremarkable. KIDNEYS: Multiple simple-appearing bilateral kidney cysts are noted. The largest arises from the upper pole of the right kidney, measuring 2.2 cm, image 20/4. These are compatible with Bosniak class 1 cysts requiring no further follow-up. LYMPH NODES: Prominent upper abdominal lymph nodes identified measuring up to 9 mm. No adenopathy. VASCULATURE:  Unremarkable. PERITONEUM: No ascites. ABDOMINAL WALL: No hernia. No mass. BOWEL: Grossly unremarkable. No bowel obstruction. BONES: No acute abnormality or worrisome osseous lesion. SOFT TISSUES: Incidental right ovary cyst appears uniformly T2 hyperintense without internal septation or nodule, measuring 2.3 cm. MISCELLANEOUS:  Unremarkable. IMPRESSION: 1. Choledocholithiasis with distal common bile duct stones (up to 1 cm) causing biliary ductal dilatation, including a markedly dilated common bile duct (up to 1.8 cm) and intrahepatic biliary dilatation, in the post-cholecystectomy setting. 2. Equivocal interstitial pancreatic edema without main pancreatic duct dilatation, mass, peripancreatic fat stranding, or free fluid. 3. Simple-appearing 2.3 cm right ovarian cyst  No follow-up imaging recommended. Electronically signed by: Waddell Calk MD 11/13/2023 06:11 AM EDT RP Workstation: [id number]   DG Foot Complete Left Result Date: 11/12/2023 Please see detailed radiograph report in office note.      Assessment / Plan:    53 y/o female here with the following:  Choledocholithiasis Biliary obstruction  Doing well, on antibiotics prophylactically, afebrile and labs stable. MRCP confirms dilated CBD with choledocholithiasis (despite history of cholecystectomy). ERCP is recommended. Due to endoscopy schedule, there is no room to do her ERCP today, tentatively have this planned for tomorrow. She understands this. Will monitor today, NPO after MN, plan for ERCP tomorrow with Dr. Abran.  I have discussed what ERCP is and what it entails, risks / benefits of the exam and anesthesia. She understands and wishes to proceed.   Call with questions, will follow.  Marcey Naval, MD [facility]

## 2023-11-14 NOTE — Plan of Care (Signed)
  Problem: Education: Goal: Knowledge of General Education information will improve Description: Including pain rating scale, medication(s)/side effects and non-pharmacologic comfort measures Outcome: Progressing   Problem: Health Behavior/Discharge Planning: Goal: Ability to manage health-related needs will improve Outcome: Progressing   Problem: Clinical Measurements: Goal: Will remain free from infection Outcome: Not Progressing   Problem: Activity: Goal: Risk for activity intolerance will decrease Outcome: Not Progressing

## 2023-11-15 ENCOUNTER — Inpatient Hospital Stay (HOSPITAL_COMMUNITY)

## 2023-11-15 ENCOUNTER — Inpatient Hospital Stay (HOSPITAL_COMMUNITY): Admitting: Anesthesiology

## 2023-11-15 ENCOUNTER — Encounter (HOSPITAL_COMMUNITY)
Admission: AD | Disposition: A | Discharge status: Home / Self Care | Payer: Self-pay | Source: Other Acute Inpatient Hospital | Attending: Internal Medicine

## 2023-11-15 ENCOUNTER — Encounter (HOSPITAL_COMMUNITY): Payer: Self-pay | Admitting: Hospitalist

## 2023-11-15 DIAGNOSIS — K8050 Calculus of bile duct without cholangitis or cholecystitis without obstruction: Secondary | ICD-10-CM | POA: Diagnosis not present

## 2023-11-15 DIAGNOSIS — K838 Other specified diseases of biliary tract: Secondary | ICD-10-CM

## 2023-11-15 DIAGNOSIS — Z9049 Acquired absence of other specified parts of digestive tract: Secondary | ICD-10-CM

## 2023-11-15 DIAGNOSIS — I509 Heart failure, unspecified: Secondary | ICD-10-CM

## 2023-11-15 DIAGNOSIS — N189 Chronic kidney disease, unspecified: Secondary | ICD-10-CM

## 2023-11-15 DIAGNOSIS — R748 Abnormal levels of other serum enzymes: Secondary | ICD-10-CM | POA: Diagnosis not present

## 2023-11-15 DIAGNOSIS — I130 Hypertensive heart and chronic kidney disease with heart failure and stage 1 through stage 4 chronic kidney disease, or unspecified chronic kidney disease: Secondary | ICD-10-CM

## 2023-11-15 HISTORY — PX: ERCP: SHX5425

## 2023-11-15 LAB — GLUCOSE, CAPILLARY
Glucose-Capillary: 115 mg/dL — ABNORMAL HIGH (ref 70–99)
Glucose-Capillary: 104 mg/dL — ABNORMAL HIGH (ref 70–99)
Glucose-Capillary: 89 mg/dL (ref 70–99)
Glucose-Capillary: 114 mg/dL — ABNORMAL HIGH (ref 70–99)
Glucose-Capillary: 106 mg/dL — ABNORMAL HIGH (ref 70–99)
Glucose-Capillary: 183 mg/dL — ABNORMAL HIGH (ref 70–99)

## 2023-11-15 LAB — CBC
HCT: 38.3 % (ref 36.0–46.0)
Hemoglobin: 11.5 g/dL — ABNORMAL LOW (ref 12.0–15.0)
MCH: 26.4 pg (ref 26.0–34.0)
MCHC: 30 g/dL (ref 30.0–36.0)
MCV: 88 fL (ref 80.0–100.0)
Platelets: 348 K/uL (ref 150–400)
RBC: 4.35 MIL/uL (ref 3.87–5.11)
RDW: 16 % — ABNORMAL HIGH (ref 11.5–15.5)
WBC: 7.9 K/uL (ref 4.0–10.5)
nRBC: 0 % (ref 0.0–0.2)

## 2023-11-15 LAB — COMPREHENSIVE METABOLIC PANEL WITH GFR
ALT: 93 U/L — ABNORMAL HIGH (ref 0–44)
AST: 45 U/L — ABNORMAL HIGH (ref 15–41)
Albumin: 2.5 g/dL — ABNORMAL LOW (ref 3.5–5.0)
Alkaline Phosphatase: 381 U/L — ABNORMAL HIGH (ref 38–126)
Anion gap: 11 (ref 5–15)
BUN: 16 mg/dL (ref 6–20)
CO2: 25 mmol/L (ref 22–32)
Calcium: 7.9 mg/dL — ABNORMAL LOW (ref 8.9–10.3)
Chloride: 104 mmol/L (ref 98–111)
Creatinine, Ser: 0.88 mg/dL (ref 0.44–1.00)
GFR, Estimated: >60 mL/min (ref >60)
Glucose, Bld: 117 mg/dL — ABNORMAL HIGH (ref 70–99)
Potassium: 3.8 mmol/L (ref 3.5–5.1)
Sodium: 140 mmol/L (ref 135–145)
Total Bilirubin: 1 mg/dL (ref 0.0–1.2)
Total Protein: 6.4 g/dL — ABNORMAL LOW (ref 6.5–8.1)

## 2023-11-15 LAB — MAGNESIUM: Magnesium: 1.9 mg/dL (ref 1.7–2.4)

## 2023-11-15 SURGERY — ERCP, WITH INTERVENTION IF INDICATED
Anesthesia: General

## 2023-11-15 MED ORDER — ONDANSETRON HCL 4 MG/2ML IJ SOLN
INTRAMUSCULAR | Status: DC | PRN
  Administered 2023-11-15: 4 mg via INTRAVENOUS

## 2023-11-15 MED ORDER — FENTANYL CITRATE (PF) 100 MCG/2ML IJ SOLN
INTRAMUSCULAR | Status: AC
  Filled 2023-11-15: qty 2

## 2023-11-15 MED ORDER — EPINEPHRINE 1 MG/10ML IV SOSY
PREFILLED_SYRINGE | INTRAVENOUS | Status: AC
  Filled 2023-11-15: qty 10

## 2023-11-15 MED ORDER — GLUCAGON HCL RDNA (DIAGNOSTIC) 1 MG IJ SOLR
INTRAMUSCULAR | Status: AC
  Filled 2023-11-15: qty 1

## 2023-11-15 MED ORDER — SODIUM CHLORIDE 0.9 % IV SOLN
INTRAVENOUS | Status: DC | PRN
  Administered 2023-11-15: 100 mL

## 2023-11-15 MED ORDER — DICLOFENAC SUPPOSITORY 100 MG
RECTAL | Status: AC
  Filled 2023-11-15: qty 1

## 2023-11-15 MED ORDER — DEXAMETHASONE SOD PHOSPHATE PF 10 MG/ML IJ SOLN
INTRAMUSCULAR | Status: DC | PRN
  Administered 2023-11-15: 5 mg via INTRAVENOUS

## 2023-11-15 MED ORDER — DICLOFENAC SUPPOSITORY 100 MG: 100.0000 mg | Freq: Once | RECTAL | Status: DC

## 2023-11-15 MED ORDER — PHENYLEPHRINE 80 MCG/ML (10ML) SYRINGE FOR IV PUSH (FOR BLOOD PRESSURE SUPPORT)
PREFILLED_SYRINGE | INTRAVENOUS | Status: DC | PRN
  Administered 2023-11-15: 80 ug via INTRAVENOUS

## 2023-11-15 MED ORDER — LACTATED RINGERS IV SOLN: INTRAVENOUS | Status: DC | PRN

## 2023-11-15 MED ORDER — PROPOFOL 10 MG/ML IV BOLUS
INTRAVENOUS | Status: DC | PRN
  Administered 2023-11-15: 160 mg via INTRAVENOUS

## 2023-11-15 MED ORDER — LIDOCAINE 2% (20 MG/ML) 5 ML SYRINGE
INTRAMUSCULAR | Status: DC | PRN
  Administered 2023-11-15: 60 mg via INTRAVENOUS

## 2023-11-15 MED ORDER — LACTATED RINGERS IV SOLN
INTRAVENOUS | Status: AC | PRN
Start: 2023-11-15 — End: 2023-11-15
  Administered 2023-11-15: 1000 mL via INTRAVENOUS

## 2023-11-15 MED ORDER — CIPROFLOXACIN IN D5W 400 MG/200ML IV SOLN
INTRAVENOUS | Status: AC
  Filled 2023-11-15: qty 200

## 2023-11-15 MED ORDER — SUGAMMADEX SODIUM 200 MG/2ML IV SOLN
INTRAVENOUS | Status: DC | PRN
  Administered 2023-11-15: 400 mg via INTRAVENOUS

## 2023-11-15 MED ORDER — DICLOFENAC SUPPOSITORY 100 MG
RECTAL | Status: DC | PRN
Start: 2023-11-15 — End: 2023-11-15
  Administered 2023-11-15: 100 mg via RECTAL

## 2023-11-15 MED ORDER — ROCURONIUM BROMIDE 10 MG/ML (PF) SYRINGE
PREFILLED_SYRINGE | INTRAVENOUS | Status: DC | PRN
  Administered 2023-11-15: 70 mg via INTRAVENOUS

## 2023-11-15 MED ORDER — FENTANYL CITRATE (PF) 250 MCG/5ML IJ SOLN
INTRAMUSCULAR | Status: DC | PRN
  Administered 2023-11-15: 100 ug via INTRAVENOUS

## 2023-11-15 NOTE — Progress Notes (Signed)
 PROGRESS NOTE  Sara Zhang FMW:[id number] DOB: [birth year] DOA: 11/13/2023 PCP: Valerio Melanie DASEN, NP   LOS: 2 days   Brief Narrative / Interim history: 53 year old female with history of remote cholecystectomy, chronic diastolic CHF, HTN, HLD, DM2 comes into the hospital with right upper quadrant pain for couple of weeks, nausea, vomiting.  MRCP showed choledocholithiasis and GI was consulted and she was admitted to the hospital.  Subjective / 24h Interval events: She is a little bit anxious about the upcoming procedure, but denies any pain in her abdomen, no nausea or vomiting  Assesement and Plan: Principal problem Choledocholithiasis -based on MRCP, symptoms, LFTs.  GI consulted and following, she is planned for an ERCP later on today - Continue antibiotics meanwhile.  LFTs improving  Active problems Chronic diastolic CHF-euvolemic, hold torsemide, resume diuretics following the procedure.  Continue metoprolol  Essential hypertension-continue losartan , metoprolol, blood pressure is controlled  Obesity, morbid-BMI 47.  She would benefit from weight loss  DM2, poorly controlled, with hyperglycemia-continue glargine, sliding scale, CBGs acceptable  Lab Results  Component Value Date   HGBA1C 8.0 (H) 11/13/2023   CBG (last 3)  Recent Labs    11/14/23 2143 11/15/23 0744 11/15/23 1201  GLUCAP 129* 115* 104*    Scheduled Meds:  amLODipine   5 mg Oral Daily   insulin  aspart  0-15 Units Subcutaneous TID WC   insulin  glargine  25 Units Subcutaneous QHS   losartan   25 mg Oral Daily   metoprolol succinate  25 mg Oral Daily   Continuous Infusions:  ampicillin-sulbactam (UNASYN) IV 1.5 g (11/15/23 0508)   PRN Meds:.morphine  injection, ondansetron   Current Outpatient Medications  Medication Instructions   albuterol  (VENTOLIN  HFA) 108 (90 Base) MCG/ACT inhaler 2 puffs, Inhalation, Every 6 hours PRN   amLODipine  (NORVASC ) 5 MG tablet TAKE 1 TABLET(5 MG) BY MOUTH DAILY    Basaglar  KwikPen 52 Units, Nightly   Blood Glucose Monitoring Suppl (CONTOUR NEXT MONITOR) w/Device KIT To check blood sugar 4 times daily.   Calcium  Carbonate-Vitamin D  (OYSTER SHELL CALCIUM  500 + D) 500-125 MG-UNIT TABS 1 tablet, Daily   Cholecalciferol  1.25 MG (50000 UT) TABS 1 tablet, Oral, Weekly   ferrous sulfate  325 mg, Daily with breakfast   furosemide  (LASIX ) 40 MG tablet TAKE 1 TABLET(40 MG) BY MOUTH TWICE DAILY   glucose blood (CONTOUR NEXT TEST) test strip TEST BLOOD SUGAR ONCE DAILY   Insulin  Pen Needle 1 Units, Does not apply, Every morning, Pen needles   JARDIANCE  25 MG TABS tablet TAKE 1 TABLET(25 MG) BY MOUTH DAILY   Lancets (ONETOUCH ULTRASOFT) lancets Use as instructed   levonorgestrel  (MIRENA ) 20 MCG/24HR IUD 1 each,  Once   losartan  (COZAAR ) 25 mg, Oral, Daily, OFFICE VISIT NEEDED FOR ADDITIONAL REFILLS   meloxicam (MOBIC) 15 mg, Oral, Daily   methylPREDNISolone  (MEDROL  DOSEPAK) 4 MG TBPK tablet 6 day dose pack - take as directed   metoprolol succinate (TOPROL-XL) 25 mg, Oral, Daily, Take with or immediately following a meal.   Mounjaro 15 mg, Weekly   ondansetron  (ZOFRAN -ODT) 4 mg, Every 8 hours PRN   OXYGEN 2 L, Daily    Diet Orders (From admission, onward)     Start     Ordered   11/15/23 0001  Diet NPO time specified  Diet effective midnight        11/14/23 0848            DVT prophylaxis: SCDs Start: 11/13/23 1633   Lab Results  Component Value Date  PLT 348 11/15/2023      Code Status: Full Code  Family Communication: No family at bedside  Status is: Inpatient Remains inpatient appropriate because: Severity of illness   Level of care: Med-Surg  Consultants:  GI  Objective: Vitals:   11/14/23 1032 11/14/23 1803 11/14/23 2233 11/15/23 0532  BP: 114/67 129/77 125/77 (!) 103/49  Pulse: 72 77 73 66  Resp: 16 16 18    Temp: 98 F (36.7 C) 97.8 F (36.6 C) 98.5 F (36.9 C) 98.4 F (36.9 C)  TempSrc: Oral Oral Oral   SpO2: 92% 100%  94% 95%  Weight:      Height:        Intake/Output Summary (Last 24 hours) at 11/15/2023 1203 Last data filed at 11/15/2023 0800 Gross per 24 hour  Intake 440 ml  Output --  Net 440 ml   Wt Readings from Last 3 Encounters:  11/13/23 (!) 144.5 kg  11/13/23 100.7 kg  11/07/23 (!) 146.2 kg    Examination:  Constitutional: NAD Eyes: lids and conjunctivae normal, no scleral icterus ENMT: mmm Neck: normal, supple Respiratory: clear to auscultation bilaterally, no wheezing, no crackles.  Cardiovascular: Regular rate and rhythm, no murmurs / rubs / gallops. No LE edema. Abdomen: soft, no distention, no tenderness. Bowel sounds positive.    Data Reviewed: I have independently reviewed following labs and imaging studies   CBC Recent Labs  Lab 11/13/23 0226 11/13/23 1845 11/14/23 0241 11/15/23 0711  WBC 10.4 10.9* 8.8 7.9  HGB 13.1 13.0 12.7 11.5*  HCT 41.7 43.0 42.5 38.3  PLT 406* 399 346 348  MCV 85.1 87.2 88.4 88.0  MCH 26.7 26.4 26.4 26.4  MCHC 31.4 30.2 29.9* 30.0  RDW 15.8* 16.2* 16.1* 16.0*  LYMPHSABS  --   --  1.9  --   MONOABS  --   --  0.8  --   EOSABS  --   --  0.1  --   BASOSABS  --   --  0.0  --     Recent Labs  Lab 11/13/23 0226 11/13/23 1845 11/14/23 0241 11/15/23 0711  NA 136  --  140 140  K 3.6  --  3.8 3.8  CL 101  --  103 104  CO2 24  --  26 25  GLUCOSE 180*  --  152* 117*  BUN 12  --  18 16  CREATININE 0.86 1.32* 1.30* 0.88  CALCIUM  8.4*  --  8.1* 7.9*  AST 86*  --  101* 45*  ALT 116*  --  134* 93*  ALKPHOS 488*  --  471* 381*  BILITOT 3.3*  --  1.7* 1.0  ALBUMIN 3.1*  --  2.5* 2.5*  MG  --   --   --  1.9  INR  --   --  1.1  --   HGBA1C  --  8.0*  --   --     ------------------------------------------------------------------------------------------------------------------ No results for input(s): CHOL, HDL, LDLCALC, TRIG, CHOLHDL, LDLDIRECT in the last 72 hours.  Lab Results  Component Value Date   HGBA1C 8.0 (H)  11/13/2023   ------------------------------------------------------------------------------------------------------------------ No results for input(s): TSH, T4TOTAL, T3FREE, THYROIDAB in the last 72 hours.  Invalid input(s): FREET3  Cardiac Enzymes No results for input(s): CKMB, TROPONINI, MYOGLOBIN in the last 168 hours.  Invalid input(s): CK ------------------------------------------------------------------------------------------------------------------    Component Value Date/Time   BNP 170.5 (H) 06/29/2022 1008    CBG: Recent Labs  Lab 11/14/23 1216 11/14/23 1644 11/14/23 2143  11/15/23 0744 11/15/23 1201  GLUCAP 163* 216* 129* 115* 104*    No results found for this or any previous visit (from the past 240 hours).   Radiology Studies: No results found.  Nilda Fendt, MD, PhD [facility]  Between 7 am - 7 pm I am available, please contact me via Amion (for emergencies) or Securechat (non urgent messages)  Between 7 pm - 7 am I am not available, please contact night coverage MD/APP via Amion

## 2023-11-15 NOTE — Progress Notes (Signed)
 Patient is NPO for afternoon procedure, morning meds held per MD

## 2023-11-15 NOTE — Interval H&P Note (Signed)
 History and Physical Interval Note:  11/15/2023 1:56 PM  Sara Zhang  has presented today for surgery, with the diagnosis of Common bile duct stones.  I have personally reviewed her x-rays, laboratories, and prior endoscopy reports.  I have reviewed her case with the general GI team who has appropriately requested ERCP.  I have fully evaluated the patient in the preprocedure area of the endoscopy suite.  The various methods of treatment have been discussed with the patient and family. After consideration of risks, benefits and other options for treatment, the patient has consented to  Procedure(s): ERCP, WITH INTERVENTION IF INDICATED (N/A) as a surgical intervention.  The patient's history has been reviewed, patient examined, no change in status, stable for surgery.  I have reviewed the patient's chart and labs.  Questions were answered to the patient's satisfaction.     Norleen SAILOR. Abran Raddle., M.D. [facility] Division of Gastroenterology

## 2023-11-15 NOTE — Anesthesia Procedure Notes (Signed)
 Procedure Name: Intubation Date/Time: 11/15/2023 2:25 PM  Performed by: Scherrie Mast, CRNAPre-anesthesia Checklist: Patient identified, Emergency Drugs available, Suction available and Patient being monitored Patient Re-evaluated:Patient Re-evaluated prior to induction Oxygen Delivery Method: Circle System Utilized Preoxygenation: Pre-oxygenation with 100% oxygen Induction Type: IV induction Ventilation: Mask ventilation without difficulty Laryngoscope Size: Mac and 4 Grade View: Grade I Tube type: Oral Tube size: 7.0 mm Number of attempts: 1 Airway Equipment and Method: Stylet and Oral airway Placement Confirmation: ETT inserted through vocal cords under direct vision, positive ETCO2 and breath sounds checked- equal and bilateral Secured at: 21 cm Tube secured with: Tape Dental Injury: Teeth and Oropharynx as per pre-operative assessment

## 2023-11-15 NOTE — Progress Notes (Signed)
 Report given to OR nurse Gustav for procedure this afternoon.

## 2023-11-15 NOTE — Anesthesia Preprocedure Evaluation (Signed)
 Anesthesia Evaluation  Patient identified by MRN, date of birth, ID band Patient awake    Reviewed: Allergy & Precautions, NPO status , Patient's Chart, lab work & pertinent test results  History of Anesthesia Complications Negative for: history of anesthetic complications  Airway Mallampati: I  TM Distance: >3 FB Neck ROM: Full    Dental  (+) Teeth Intact, Dental Advisory Given   Pulmonary neg shortness of breath, asthma , sleep apnea , neg recent URI   breath sounds clear to auscultation       Cardiovascular hypertension, Pt. on medications and Pt. on home beta blockers (-) angina +CHF  (-) Past MI  Rhythm:Regular   1. Left ventricular ejection fraction, by visual estimation, is 55 to  60%. The left ventricle has normal function. Normal left ventricular size.  There is mildly increased left ventricular hypertrophy.   2. Definity  contrast agent was given IV to delineate the left ventricular  endocardial borders.   3. Global right ventricle has normal systolic function.The right  ventricular size is normal. No increase in right ventricular wall  thickness.   4. Left atrial size was mildly dilated.   5. Right atrial size was mildly dilated.   6. The mitral valve is normal in structure. No evidence of mitral valve  regurgitation. No evidence of mitral stenosis.   7. The tricuspid valve is normal in structure. Tricuspid valve  regurgitation was not visualized by color flow Doppler.   8. The aortic valve is normal in structure. Aortic valve regurgitation  was not visualized by color flow Doppler. Structurally normal aortic  valve, with no evidence of sclerosis or stenosis.   9. The pulmonic valve was normal in structure. Pulmonic valve  regurgitation is not visualized by color flow Doppler.  10. TR signal is inadequate for assessing pulmonary artery systolic  pressure.     Neuro/Psych negative neurological ROS  negative psych  ROS   GI/Hepatic   Endo/Other  diabetes  Class 3 obesity  Renal/GU CRFRenal diseaseLab Results      Component                Value               Date                      NA                       140                 11/15/2023                K                        3.8                 11/15/2023                CO2                      25                  11/15/2023                GLUCOSE                  117 (H)  11/15/2023                BUN                      16                  11/15/2023                CREATININE               0.88                11/15/2023                CALCIUM                   7.9 (L)             11/15/2023                EGFR                     65                  09/23/2023                GFRNONAA                 >60                 11/15/2023                Musculoskeletal   Abdominal   Peds  Hematology   Anesthesia Other Findings   Reproductive/Obstetrics                              Anesthesia Physical Anesthesia Plan  ASA: 3  Anesthesia Plan: General   Post-op Pain Management: Minimal or no pain anticipated   Induction: Intravenous  PONV Risk Score and Plan: 3 and Ondansetron , Dexamethasone  and Midazolam   Airway Management Planned: Oral ETT  Additional Equipment: None  Intra-op Plan:   Post-operative Plan: Extubation in OR  Informed Consent: I have reviewed the patients History and Physical, chart, labs and discussed the procedure including the risks, benefits and alternatives for the proposed anesthesia with the patient or authorized representative who has indicated his/her understanding and acceptance.     Dental advisory given  Plan Discussed with: CRNA  Anesthesia Plan Comments:          Anesthesia Quick Evaluation

## 2023-11-15 NOTE — Op Note (Signed)
 [facility] Patient Name: Sara Zhang Procedure Date : 11/15/2023 MRN: [id number] Attending MD: Norleen SAILOR. Abran , MD, [id number] Date of Birth: 1971-01-21 CSN: [id number] Age: 53 Admit Type: Inpatient Procedure:                ERCP with sphincterotomy and common duct stone                            extraction Indications:              Bile duct stone on MRCP Providers:                Norleen SAILOR. Abran, MD, Collene Edu, RN, Haskel Chris,                            Technician Referring MD:             [facility] hospitalist Medicines:                General Anesthesia Complications:            No immediate complications. Estimated Blood Loss:     Estimated blood loss: none. Procedure:                Pre-Anesthesia Assessment:                           - Prior to the procedure, a History and Physical                            was performed, and patient medications and                            allergies were reviewed. The patient is competent.                            The risks and benefits of the procedure and the                            sedation options and risks were discussed with the                            patient. All questions were answered and informed                            consent was obtained. Patient identification and                            proposed procedure were verified by the physician.                            Mental Status Examination: alert and oriented.                            Airway Examination: normal oropharyngeal airway and  neck mobility. Respiratory Examination: clear to                            auscultation. CV Examination: normal. Prophylactic                            Antibiotics: The patient does not require                            prophylactic antibiotics. Prior Anticoagulants: The                            patient has taken no anticoagulant or antiplatelet                             agents. ASA Grade Assessment: III - A patient with                            severe systemic disease. After reviewing the risks                            and benefits, the patient was deemed in                            satisfactory condition to undergo the procedure.                            The anesthesia plan was to use moderate sedation /                            analgesia (conscious sedation). Immediately prior                            to administration of medications, the patient was                            re-assessed for adequacy to receive sedatives. The                            heart rate, respiratory rate, oxygen saturations,                            blood pressure, adequacy of pulmonary ventilation,                            and response to care were monitored throughout the                            procedure. The physical status of the patient was                            re-assessed after the procedure.  After obtaining informed consent, the scope was                            passed under direct vision. Throughout the                            procedure, the patient's blood pressure, pulse, and                            oxygen saturations were monitored continuously. The                            [id number]-Q190V ([id number]) Olympus duodenoscope was                            introduced through the mouth, and used to inject                            contrast into and used to inject contrast into the                            bile duct. The ERCP was accomplished without                            difficulty. The patient tolerated the procedure                            well. Scope In: Scope Out: Findings:      1. The side-viewing endoscope was passed blindly into the esophagus. The       stomach and duodenum were grossly normal. The major papilla demonstrated       evidence of prior sphincterotomy.      2. A scout radiograph of  the abdomen with the endoscope and position was       unremarkable      3. The bile duct was selectively and deeply cannulated on the first pass       of the sphincterotome. Injection of contrast yielded a markedly dilated       bile duct with the proximal portion about 20 mm. Several filling defects       noted.      4. A biliary sphincterotomy was extended with cutting in the 12:00       orientation using the ERBE system.      5. A 15 mm - 18 mm sequential balloon was pulled through the bile duct       multiple times. Several stones removed. Postextraction occlusion       cholangiogram demonstrated no residual filling defects. Biliary drainage       was excellent.      6. There was no manipulation or injection of the pancreatic duct. Impression:               1. Choledocholithiasis status post ERC with                            sphincterotomy, duct stone extraction  2. Status post cholecystectomy. Recommendation:           1. Standard post ERCP care                           2. Observe patient's clinical course.                           3. Trend liver test                           4. Inpatient GI team will follow this patient Procedure Code(s):        --- Professional ---                           [phone], Endoscopic retrograde                            cholangiopancreatography (ERCP); with removal of                            calculi/debris from biliary/pancreatic duct(s)                           43262, Endoscopic retrograde                            cholangiopancreatography (ERCP); with                            sphincterotomy/papillotomy Diagnosis Code(s):        --- Professional ---                           Z90.49, Acquired absence of other specified parts                            of digestive tract                           K80.50, Calculus of bile duct without cholangitis                            or cholecystitis without obstruction                            K83.8, Other specified diseases of biliary tract CPT copyright 2022 [employer]. All rights reserved. The codes documented in this report are preliminary and upon coder review may  be revised to meet current compliance requirements. Norleen SAILOR. Abran, MD 11/15/2023 3:56:17 PM This report has been signed electronically. Number of Addenda: 0

## 2023-11-15 NOTE — Plan of Care (Signed)
   Problem: Health Behavior/Discharge Planning: Goal: Ability to manage health-related needs will improve Outcome: Progressing

## 2023-11-15 NOTE — Transfer of Care (Signed)
 Immediate Anesthesia Transfer of Care Note  Patient: Sara Zhang  Procedure(s) Performed: ERCP, WITH INTERVENTION IF INDICATED  Patient Location: PACU  Anesthesia Type:General  Level of Consciousness: awake, alert , and oriented  Airway & Oxygen Therapy: Patient Spontanous Breathing and Patient connected to nasal cannula oxygen  Post-op Assessment: Report given to RN and Post -op Vital signs reviewed and stable  Post vital signs: Reviewed and stable  Last Vitals:  Vitals Value Taken Time  BP 131/77 11/15/23 15:54  Temp 97   Pulse 81 11/15/23 15:56  Resp 18 11/15/23 15:56  SpO2 92 % 11/15/23 15:56  Vitals shown include unfiled device data.  Last Pain:  Vitals:   11/15/23 1348  TempSrc: Temporal  PainSc: 0-No pain         Complications: No notable events documented.

## 2023-11-16 ENCOUNTER — Encounter (HOSPITAL_COMMUNITY): Payer: Self-pay | Admitting: Internal Medicine

## 2023-11-16 DIAGNOSIS — Z9889 Other specified postprocedural states: Secondary | ICD-10-CM

## 2023-11-16 DIAGNOSIS — L03116 Cellulitis of left lower limb: Secondary | ICD-10-CM

## 2023-11-16 DIAGNOSIS — R7989 Other specified abnormal findings of blood chemistry: Secondary | ICD-10-CM

## 2023-11-16 LAB — CBC
HCT: 40.7 % (ref 36.0–46.0)
Hemoglobin: 12.2 g/dL (ref 12.0–15.0)
MCH: 26.6 pg (ref 26.0–34.0)
MCHC: 30 g/dL (ref 30.0–36.0)
MCV: 88.7 fL (ref 80.0–100.0)
Platelets: 300 K/uL (ref 150–400)
RBC: 4.59 MIL/uL (ref 3.87–5.11)
RDW: 16.2 % — ABNORMAL HIGH (ref 11.5–15.5)
WBC: 8 K/uL (ref 4.0–10.5)
nRBC: 0 % (ref 0.0–0.2)

## 2023-11-16 LAB — GLUCOSE, CAPILLARY
Glucose-Capillary: 112 mg/dL — ABNORMAL HIGH (ref 70–99)
Glucose-Capillary: 170 mg/dL — ABNORMAL HIGH (ref 70–99)

## 2023-11-16 LAB — COMPREHENSIVE METABOLIC PANEL WITH GFR
ALT: 82 U/L — ABNORMAL HIGH (ref 0–44)
AST: 43 U/L — ABNORMAL HIGH (ref 15–41)
Albumin: 2.5 g/dL — ABNORMAL LOW (ref 3.5–5.0)
Alkaline Phosphatase: 339 U/L — ABNORMAL HIGH (ref 38–126)
Anion gap: 11 (ref 5–15)
BUN: 16 mg/dL (ref 6–20)
CO2: 23 mmol/L (ref 22–32)
Calcium: 7.9 mg/dL — ABNORMAL LOW (ref 8.9–10.3)
Chloride: 107 mmol/L (ref 98–111)
Creatinine, Ser: 1.01 mg/dL — ABNORMAL HIGH (ref 0.44–1.00)
GFR, Estimated: >60 mL/min (ref >60)
Glucose, Bld: 132 mg/dL — ABNORMAL HIGH (ref 70–99)
Potassium: 3.9 mmol/L (ref 3.5–5.1)
Sodium: 141 mmol/L (ref 135–145)
Total Bilirubin: 0.5 mg/dL (ref 0.0–1.2)
Total Protein: 6.4 g/dL — ABNORMAL LOW (ref 6.5–8.1)

## 2023-11-16 LAB — MAGNESIUM: Magnesium: 2 mg/dL (ref 1.7–2.4)

## 2023-11-16 NOTE — Plan of Care (Signed)

## 2023-11-16 NOTE — Discharge Summary (Signed)
 Physician Discharge Summary  Sara Zhang FMW:[id number] DOB: [birth year] DOA: 11/13/2023  PCP: Sara Melanie DASEN, NP  Admit date: 11/13/2023 Discharge date: 11/16/2023  Admitted From: home Disposition:  home  Recommendations for Outpatient Follow-up:  Follow up with PCP in 1-2 weeks Please obtain BMP/CBC in one week  Home Health: none Equipment/Devices: none  Discharge Condition: stable CODE STATUS: Full code Diet Orders (From admission, onward)     Start     Ordered   11/15/23 1735  Diet Heart Room service appropriate? Yes; Fluid consistency: Thin  Diet effective now       Question Answer Comment  Room service appropriate? Yes   Fluid consistency: Thin      11/15/23 1735           Brief Narrative / Interim history: 53 year old female with history of remote cholecystectomy, chronic diastolic CHF, HTN, HLD, DM2 comes into the hospital with right upper quadrant pain for couple of weeks, nausea, vomiting.  MRCP showed choledocholithiasis and GI was consulted and she was admitted to the hospital.  Hospital Course / Discharge diagnoses: Principal problem Choledocholithiasis -based on MRCP, symptoms, LFTs.  Gastroenterology consulted and followed patient while hospitalized.  She underwent an ERCP on 10/11 status post biliary sphincterotomy and several stone removal.  She recovered well postoperatively, tolerating a regular diet, no further abdominal pain, no nausea, no vomiting, LFTs continue to improve and she will be discharged home in stable condition.   Active problems Chronic diastolic CHF-resume home medications on discharge Essential hypertension-resume home medications on discharge Obesity, morbid-BMI 47.  She would benefit from weight loss.  She is on Mounjaro and has been losing weight and plans to continue  DM2, poorly controlled, with hyperglycemia-continue home medications on discharge  Sepsis ruled out   Discharge Instructions   Allergies as of  11/16/2023       Reactions   Lisinopril  Cough        Medication List     STOP taking these medications    methylPREDNISolone  4 MG Tbpk tablet Commonly known as: MEDROL  DOSEPAK       TAKE these medications    albuterol  108 (90 Base) MCG/ACT inhaler Commonly known as: VENTOLIN  HFA Inhale 2 puffs into the lungs every 6 (six) hours as needed for wheezing or shortness of breath.   amLODipine  5 MG tablet Commonly known as: NORVASC  TAKE 1 TABLET(5 MG) BY MOUTH DAILY   Basaglar  KwikPen 100 UNIT/ML Inject 52 Units into the skin at bedtime. What changed: when to take this   Cholecalciferol  1.25 MG (50000 UT) Tabs Take 1 tablet by mouth once a week.   Contour Next Monitor w/Device Kit To check blood sugar 4 times daily.   ferrous sulfate  325 (65 FE) MG tablet Take 325 mg by mouth daily with breakfast.   furosemide  40 MG tablet Commonly known as: LASIX  TAKE 1 TABLET(40 MG) BY MOUTH TWICE DAILY   glucose blood test strip Commonly known as: Contour Next Test TEST BLOOD SUGAR ONCE DAILY   Insulin  Pen Needle 32G X 4 MM Misc 1 Units by Does not apply route every morning. Pen needles   Jardiance  25 MG Tabs tablet Generic drug: empagliflozin  TAKE 1 TABLET(25 MG) BY MOUTH DAILY   levonorgestrel  20 MCG/24HR IUD Commonly known as: MIRENA  1 each by Intrauterine route once.   losartan  25 MG tablet Commonly known as: COZAAR  Take 1 tablet (25 mg total) by mouth daily. OFFICE VISIT NEEDED FOR ADDITIONAL REFILLS   meloxicam 15  MG tablet Commonly known as: MOBIC Take 1 tablet (15 mg total) by mouth daily.   metoprolol succinate 25 MG 24 hr tablet Commonly known as: TOPROL-XL Take 1 tablet (25 mg total) by mouth daily. Take with or immediately following a meal.   Mounjaro 15 MG/0.5ML Pen Generic drug: tirzepatide Inject 15 mg into the skin once a week.   ondansetron  4 MG disintegrating tablet Commonly known as: ZOFRAN -ODT Take 4 mg by mouth every 8 (eight) hours as  needed for nausea or vomiting.   onetouch ultrasoft lancets Use as instructed   OXYGEN Inhale 2 L into the lungs daily.   Oyster Shell Calcium  500 + D 500-125 MG-UNIT Tabs Generic drug: Calcium  Carbonate-Vitamin D  Take 1 tablet by mouth daily.       Consultations: GI  Procedures/Studies:  MR 3D Recon At Scanner Result Date: 11/14/2023 EXAM: MRCP WITH AND WITHOUT IV CONTRAST 11/13/2023 05:32:56 AM TECHNIQUE: MULTISEQUENCE, MULTIPLANAR MAGNETIC RESONANCE IMAGES OF THE ABDOMEN WITH AND WITHOUT INTRAVENOUS CONTRAST. MRCP SEQUENCES WERE PERFORMED. 10 ML GADOBUTROL  (GADAVIST ) 1 MMOL/ML INJECTION 10 ML GADOBUTROL  1 MMOL/ML IV SOLN. COMPARISON: 06/29/2022 CLINICAL HISTORY: RUQ ABDOMINAL PAIN, BILIARY DISEASE SUSPECTED, NO PRIOR IMAGING. TABLE FORMATTING FROM THE ORIGINAL NOTE WAS NOT INCLUDED.; RUQ ABDOMINAL PAIN, BILIARY DISEASE SUSPECTED; HX OF CHOLECYSECTOMY; PRIOR MRCP 04/2022; GADAVIST  LOT [id number] EXP 11/2027 FINDINGS: LIVER: MILD HEPATIC STEATOSIS. GALLBLADDER AND BILIARY SYSTEM: STATUS POST CHOLECYSTECTOMY. COMMON BILE DUCT IS DILATED, MEASURING UP TO 1.8 CM. INTRAHEPATIC BILE DUCT DILATATION IDENTIFIED. COMMON BILE DUCT STONES ARE IDENTIFIED WITHIN THE DISTAL CBD MEASURING UP TO 1 CM, IMAGE 24/5. SPLEEN: UNREMARKABLE. PANCREAS/PANCREATIC DUCT: EQUIVOCAL INTERSTITIAL EDEMA INVOLVING THE PANCREAS. NO MAIN DUCT DILATATION OR MASS. NO PERIPANCREATIC FAT STRANDING OR FREE FLUID. ADRENAL GLANDS: UNREMARKABLE. KIDNEYS: MULTIPLE SIMPLE-APPEARING BILATERAL KIDNEY CYSTS ARE NOTED. THE LARGEST ARISES FROM THE UPPER POLE OF THE RIGHT KIDNEY, MEASURING 2.2 CM, IMAGE 20/4. THESE ARE COMPATIBLE WITH BOSNIAK CLASS 1 CYSTS REQUIRING NO FURTHER FOLLOW-UP. LYMPH NODES: PROMINENT UPPER ABDOMINAL LYMPH NODES IDENTIFIED MEASURING UP TO 9 MM. NO ADENOPATHY. VASCULATURE: UNREMARKABLE. PERITONEUM: NO ASCITES. ABDOMINAL WALL: NO HERNIA. NO MASS. BOWEL: GROSSLY UNREMARKABLE. NO BOWEL OBSTRUCTION. BONES: NO ACUTE  ABNORMALITY OR WORRISOME OSSEOUS LESION. SOFT TISSUES: INCIDENTAL RIGHT OVARY CYST APPEARS UNIFORMLY T2 HYPERINTENSE WITHOUT INTERNAL SEPTATION OR NODULE, MEASURING 2.3 CM. MISCELLANEOUS: UNREMARKABLE. IMPRESSION: 1. CHOLEDOCHOLITHIASIS WITH DISTAL COMMON BILE DUCT STONES (UP TO 1 CM) CAUSING BILIARY DUCTAL DILATATION, INCLUDING A MARKEDLY DILATED COMMON BILE DUCT (UP TO 1.8 CM) AND INTRAHEPATIC BILIARY DILATATION, IN THE POST-CHOLECYSTECTOMY SETTING. 2. EQUIVOCAL INTERSTITIAL PANCREATIC EDEMA WITHOUT MAIN PANCREATIC DUCT DILATATION, MASS, PERIPANCREATIC FAT STRANDING, OR FREE FLUID. 3. SIMPLE-APPEARING 2.3 CM RIGHT OVARIAN CYST NO FOLLOW-UP IMAGING RECOMMENDED. Electronically signed by: Waddell Calk MD 11/14/2023 08:47 AM EDT RP Workstation: [id number]   DG Chest Port 1 View Result Date: 11/13/2023 EXAM: 1 VIEW XRAY OF THE CHEST 11/13/2023 05:38:11 AM COMPARISON: CTA chest 06/29/2022 and earlier. CLINICAL HISTORY: 53 year old female. Chest pain, epigastric pain. FINDINGS: LUNGS AND PLEURA: Low but mildly improved lung volumes. No focal pulmonary opacity. No pulmonary edema. No pleural effusion. No pneumothorax. HEART AND MEDIASTINUM: Stable cardiomegaly. No acute abnormality of the mediastinal silhouette. BONES AND SOFT TISSUES: No acute osseous abnormality. IMPRESSION: 1. No acute cardiopulmonary process. 2. Stable cardiomegaly. Electronically signed by: Helayne Hurst MD 11/13/2023 06:16 AM EDT RP Workstation: [id number]   MR ABDOMEN MRCP W WO CONTAST Result Date: 11/13/2023 EXAM: MRCP WITH AND WITHOUT IV CONTRAST 11/13/2023 05:32:56 AM TECHNIQUE: Multisequence, multiplanar magnetic resonance  images of the abdomen with and without intravenous contrast. MRCP sequences were performed. 10 mL gadobutrol  (GADAVIST ) 1 MMOL/ML injection 10 mL GADOBUTROL  1 MMOL/ML IV SOLN. COMPARISON: 06/29/2022 CLINICAL HISTORY: RUQ abdominal pain, biliary disease suspected, no prior imaging. Table formatting from the original note  was not included.; RUQ abdominal pain, biliary disease suspected; hx of cholecysectomy; prior MRCP 04/2022; 10mL gadavist  LOT [id number] EXP 11/2027 FINDINGS: LIVER: Mild hepatic steatosis. GALLBLADDER AND BILIARY SYSTEM: Status post cholecystectomy. Common bile duct is dilated, measuring up to 1.8 cm. Intrahepatic bile duct dilatation identified. Common bile duct stones are identified within the distal CBD measuring up to 1 cm, image 24/5. SPLEEN: Unremarkable. PANCREAS/PANCREATIC DUCT: Equivocal interstitial edema involving the pancreas. No main duct dilatation or mass. No peripancreatic fat stranding or free fluid. ADRENAL GLANDS: Unremarkable. KIDNEYS: Multiple simple-appearing bilateral kidney cysts are noted. The largest arises from the upper pole of the right kidney, measuring 2.2 cm, image 20/4. These are compatible with Bosniak class 1 cysts requiring no further follow-up. LYMPH NODES: Prominent upper abdominal lymph nodes identified measuring up to 9 mm. No adenopathy. VASCULATURE: Unremarkable. PERITONEUM: No ascites. ABDOMINAL WALL: No hernia. No mass. BOWEL: Grossly unremarkable. No bowel obstruction. BONES: No acute abnormality or worrisome osseous lesion. SOFT TISSUES: Incidental right ovary cyst appears uniformly T2 hyperintense without internal septation or nodule, measuring 2.3 cm. MISCELLANEOUS: Unremarkable. IMPRESSION: 1. Choledocholithiasis with distal common bile duct stones (up to 1 cm) causing biliary ductal dilatation, including a markedly dilated common bile duct (up to 1.8 cm) and intrahepatic biliary dilatation, in the post-cholecystectomy setting. 2. Equivocal interstitial pancreatic edema without main pancreatic duct dilatation, mass, peripancreatic fat stranding, or free fluid. 3. Simple-appearing 2.3 cm right ovarian cyst  No follow-up imaging recommended. Electronically signed by: Waddell Calk MD 11/13/2023 06:11 AM EDT RP Workstation: [id number]   DG Foot Complete Left Result  Date: 11/12/2023 Please see detailed radiograph report in office note.    Subjective: - no chest pain, shortness of breath, no abdominal pain, nausea or vomiting.   Discharge Exam: BP (!) 141/81 (BP Location: Right Arm)   Pulse 78   Temp 98.1 F (36.7 C)   Resp 18   Ht 5' 9 (1.753 m)   Wt (!) 144.5 kg   SpO2 95%   BMI 47.04 kg/m   General: Pt is alert, awake, not in acute distress Cardiovascular: RRR, S1/S2 +, no rubs, no gallops Respiratory: CTA bilaterally, no wheezing, no rhonchi Abdominal: Soft, NT, ND, bowel sounds + Extremities: no edema, no cyanosis    The results of significant diagnostics from this hospitalization (including imaging, microbiology, ancillary and laboratory) are listed below for reference.     Microbiology: No results found for this or any previous visit (from the past 240 hours).   Labs: Basic Metabolic Panel: Recent Labs  Lab 11/13/23 0226 11/13/23 1845 11/14/23 0241 11/15/23 0711 11/16/23 0557  NA 136  --  140 140 141  K 3.6  --  3.8 3.8 3.9  CL 101  --  103 104 107  CO2 24  --  26 25 23   GLUCOSE 180*  --  152* 117* 132*  BUN 12  --  18 16 16   CREATININE 0.86 1.32* 1.30* 0.88 1.01*  CALCIUM  8.4*  --  8.1* 7.9* 7.9*  MG  --   --   --  1.9 2.0   Liver Function Tests: Recent Labs  Lab 11/13/23 0226 11/14/23 0241 11/15/23 0711 11/16/23 0557  AST 86* 101* 45* 43*  ALT 116* 134* 93* 82*  ALKPHOS 488* 471* 381* 339*  BILITOT 3.3* 1.7* 1.0 0.5  PROT 8.1 6.8 6.4* 6.4*  ALBUMIN 3.1* 2.5* 2.5* 2.5*   CBC: Recent Labs  Lab 11/13/23 0226 11/13/23 1845 11/14/23 0241 11/15/23 0711 11/16/23 0557  WBC 10.4 10.9* 8.8 7.9 8.0  NEUTROABS  --   --  5.9  --   --   HGB 13.1 13.0 12.7 11.5* 12.2  HCT 41.7 43.0 42.5 38.3 40.7  MCV 85.1 87.2 88.4 88.0 88.7  PLT 406* 399 346 348 300   CBG: Recent Labs  Lab 11/15/23 1352 11/15/23 1557 11/15/23 1729 11/15/23 2205 11/16/23 0741  GLUCAP 89 114* 106* 183* 112*   Hgb A1c Recent  Labs    11/13/23 1845  HGBA1C 8.0*   Lipid Profile No results for input(s): CHOL, HDL, LDLCALC, TRIG, CHOLHDL, LDLDIRECT in the last 72 hours. Thyroid  function studies No results for input(s): TSH, T4TOTAL, T3FREE, THYROIDAB in the last 72 hours.  Invalid input(s): FREET3 Urinalysis    Component Value Date/Time   COLORURINE AMBER (A) 11/13/2023 0226   APPEARANCEUR HAZY (A) 11/13/2023 0226   APPEARANCEUR Clear 04/11/2022 0916   LABSPEC 1.039 (H) 11/13/2023 0226   LABSPEC 1.028 06/17/2013 1151   PHURINE 5.0 11/13/2023 0226   GLUCOSEU >=500 (A) 11/13/2023 0226   GLUCOSEU >=500 06/17/2013 1151   HGBUR SMALL (A) 11/13/2023 0226   BILIRUBINUR NEGATIVE 11/13/2023 0226   BILIRUBINUR Negative 04/11/2022 0916   BILIRUBINUR Negative 06/17/2013 1151   KETONESUR 20 (A) 11/13/2023 0226   PROTEINUR >=300 (A) 11/13/2023 0226   NITRITE NEGATIVE 11/13/2023 0226   LEUKOCYTESUR NEGATIVE 11/13/2023 0226   LEUKOCYTESUR Negative 06/17/2013 1151    FURTHER DISCHARGE INSTRUCTIONS:   Get Medicines reviewed and adjusted: Please take all your medications with you for your next visit with your Primary MD   Laboratory/radiological data: Please request your Primary MD to go over all hospital tests and procedure/radiological results at the follow up, please ask your Primary MD to get all Hospital records sent to his/her office.   In some cases, they will be blood work, cultures and biopsy results pending at the time of your discharge. Please request that your primary care M.D. goes through all the records of your hospital data and follows up on these results.   Also Note the following: If you experience worsening of your admission symptoms, develop shortness of breath, life threatening emergency, suicidal or homicidal thoughts you must seek medical attention immediately by calling 911 or calling your MD immediately  if symptoms less severe.   You must read complete  instructions/literature along with all the possible adverse reactions/side effects for all the Medicines you take and that have been prescribed to you. Take any new Medicines after you have completely understood and accpet all the possible adverse reactions/side effects.    Do not drive when taking Pain medications or sleeping medications (Benzodaizepines)   Do not take more than prescribed Pain, Sleep and Anxiety Medications. It is not advisable to combine anxiety,sleep and pain medications without talking with your primary care practitioner   Special Instructions: If you have smoked or chewed Tobacco  in the last 2 yrs please stop smoking, stop any regular Alcohol  and or any Recreational drug use.   Wear Seat belts while driving.   Please note: You were cared for by a hospitalist during your hospital stay. Once you are discharged, your primary care physician will handle any further medical issues. Please note  that NO REFILLS for any discharge medications will be authorized once you are discharged, as it is imperative that you return to your primary care physician (or establish a relationship with a primary care physician if you do not have one) for your post hospital discharge needs so that they can reassess your need for medications and monitor your lab values.  Time coordinating discharge: 40 minutes  SIGNED:  Nilda Fendt, MD, PhD 11/16/2023, 11:16 AM

## 2023-11-16 NOTE — Progress Notes (Addendum)
 Patient ID: Sara Zhang, female   DOB: 1970/03/19, 53 y.o.   MRN: [id number]    Progress Note   Subjective   Day # 2 CC; recurrent abdominal pain nausea and vomiting, choledocholithiasis  Status post ERCP yesterday with extension of sphincterotomy and stone extraction of several small CBD stones.  Labs today WBC 8.0/hemoglobin 12.2/hematocrit 40.7 T bili 0.5/alk phos 339/AST 43/ALT 82  Patient says she has felt well since procedure she has no current complaint of abdominal pain nausea has resolved.  She was able to eat without difficulty and is hoping to be discharged home.   Objective   Vital signs in last 24 hours: Temp:  [97.2 F (36.2 C)-98.7 F (37.1 C)] 98.1 F (36.7 C) (10/12 0828) Pulse Rate:  [65-85] 78 (10/12 0828) Resp:  [13-20] 18 (10/12 0828) BP: (123-142)/(61-87) 141/81 (10/12 0828) SpO2:  [92 %-99 %] 95 % (10/12 0828) Weight:  [144.5 kg] 144.5 kg (10/11 1348) Last BM Date : 11/15/23 General:   older AA female  in NAD Heart:  Regular rate and rhythm; no murmurs Lungs: Respirations even and unlabored, lungs CTA bilaterally Abdomen:  Soft, BS, nontender, bowel sounds are present Extremities:  Without edema. Neurologic:  Alert and oriented,  grossly normal neurologically. Psych:  Cooperative. Normal mood and affect.  Intake/Output from previous day: 10/11 0701 - 10/12 0700 In: 935.5 [P.O.:300; I.V.:535.5; IV Piggyback:100] Out: -  Intake/Output this shift: No intake/output data recorded.  Lab Results: Recent Labs    11/14/23 0241 11/15/23 0711 11/16/23 0557  WBC 8.8 7.9 8.0  HGB 12.7 11.5* 12.2  HCT 42.5 38.3 40.7  PLT 346 348 300   BMET Recent Labs    11/14/23 0241 11/15/23 0711 11/16/23 0557  NA 140 140 141  K 3.8 3.8 3.9  CL 103 104 107  CO2 26 25 23   GLUCOSE 152* 117* 132*  BUN 18 16 16   CREATININE 1.30* 0.88 1.01*  CALCIUM  8.1* 7.9* 7.9*   LFT Recent Labs    11/16/23 0557  PROT 6.4*  ALBUMIN 2.5*  AST 43*  ALT 82*   ALKPHOS 339*  BILITOT 0.5   PT/INR Recent Labs    11/14/23 0241  LABPROT 14.4  INR 1.1    Studies/Results: No results found.     Assessment / Plan:     #18 53 year old African-American female status post remote cholecystectomy who presented to [facility] with complaints of recurrent episodes of acute epigastric pain radiating to her back associated with nausea and vomiting. Imaging with MRI/MRCP there noted a dilated common bile duct at 1.8 cm with some intrahepatic ductal dilation and CBD stones measuring up to 1 cm in the distal common bile duct. LFTs also elevated but no evidence of SIRS/sepsis.  Was transferred here for ERCP as no ERCP availability at [facility]. She underwent ERCP yesterday with Dr. Abran with extension of previous sphincterotomy and successful stone extraction of several CBD stones.  She feels well today without any abdominal pain or nausea and has been able to eat without difficulty.  LFTs are continuing to trend down, CBC normal  #2 insulin -dependent diabetes mellitus 3.  Morbid obesity 4.  Congestive heart failure 5.  Chronic kidney disease 6.  Sleep apnea with BiPAP use  Plan; Patient is stable for discharge home today from GI perspective She does not need antibiotics on discharge She has been seen by [facility]/Dr. Jinny  in the past for previous ERCP.  She does not need immediate GI follow-up, could  be seen by [facility] as she lives in [place] if she needs further GI care.   Principal Problem:   Left leg cellulitis Active Problems:   Choledocholithiasis   Elevated liver enzymes     LOS: 3 days   Amy Esterwood  PA-C10/01/2024, 11:44 AM   ATTENDING ADDENDUM Agree with assessment and plan as outlined.  Patient feeling well, tolerated breakfast, did well with ERCP. LAEs improved. Multiple stones removed.  Okay for discharge home, she can follow up with [facility] / Dr. Jinny.  We will sign off for now, call with  questions.  Marcey Naval, MD [facility]

## 2023-11-20 NOTE — Anesthesia Postprocedure Evaluation (Signed)
 Anesthesia Post Note  Patient: Sara Zhang  Procedure(s) Performed: ERCP, WITH INTERVENTION IF INDICATED     Patient location during evaluation: PACU Anesthesia Type: General Level of consciousness: awake and alert Pain management: pain level controlled Vital Signs Assessment: post-procedure vital signs reviewed and stable Respiratory status: spontaneous breathing, nonlabored ventilation and respiratory function stable Cardiovascular status: blood pressure returned to baseline and stable Postop Assessment: no apparent nausea or vomiting Anesthetic complications: no   There were no known notable events for this encounter.                Leiyah Maultsby

## 2023-11-27 NOTE — Patient Instructions (Signed)
 Be Involved in Caring For Your Health:  Taking Medications When medications are taken as directed, they can greatly improve your health. But if they are not taken as prescribed, they may not work. In some cases, not taking them correctly can be harmful. To help ensure your treatment remains effective and safe, understand your medications and how to take them. Bring your medications to each visit for review by your provider.  Your lab results, notes, and after visit summary will be available on My Chart. We strongly encourage you to use this feature. If lab results are abnormal the clinic will contact you with the appropriate steps. If the clinic does not contact you assume the results are satisfactory. You can always view your results on My Chart. If you have questions regarding your health or results, please contact the clinic during office hours. You can also ask questions on My Chart.  We at [facility] are grateful that you chose [place] to provide your care. We strive to provide evidence-based and compassionate care and are always looking for feedback. If you get a survey from the clinic please complete this so we can hear your opinions.  Heart-Healthy Eating Plan Many factors influence your heart health, including eating and exercise habits. Heart health is also called coronary health. Coronary risk increases with abnormal blood fat (lipid) levels. A heart-healthy eating plan includes limiting unhealthy fats, increasing healthy fats, limiting salt (sodium) intake, and making other diet and lifestyle changes. What is my plan? Your health care provider may recommend that: You limit your fat intake to _________% or less of your total calories each day. You limit your saturated fat intake to _________% or less of your total calories each day. You limit the amount of cholesterol in your diet to less than _________ mg per day. You limit the amount of sodium in your diet to less than _________  mg per day. What are tips for following this plan? Cooking Cook foods using methods other than frying. Baking, boiling, grilling, and broiling are all good options. Other ways to reduce fat include: Removing the skin from poultry. Removing all visible fats from meats. Steaming vegetables in water or broth. Meal planning  At meals, imagine dividing your plate into fourths: Fill one-half of your plate with vegetables and green salads. Fill one-fourth of your plate with whole grains. Fill one-fourth of your plate with lean protein foods. Eat 2-4 cups of vegetables per day. One cup of vegetables equals 1 cup (91 g) broccoli or cauliflower florets, 2 medium carrots, 1 large bell pepper, 1 large sweet potato, 1 large tomato, 1 medium white potato, 2 cups (150 g) raw leafy greens. Eat 1-2 cups of fruit per day. One cup of fruit equals 1 small apple, 1 large banana, 1 cup (237 g) mixed fruit, 1 large orange,  cup (82 g) dried fruit, 1 cup (240 mL) 100% fruit juice. Eat more foods that contain soluble fiber. Examples include apples, broccoli, carrots, beans, peas, and barley. Aim to get 25-30 g of fiber per day. Increase your consumption of legumes, nuts, and seeds to 4-5 servings per week. One serving of dried beans or legumes equals  cup (90 g) cooked, 1 serving of nuts is  oz (12 almonds, 24 pistachios, or 7 walnut halves), and 1 serving of seeds equals  oz (8 g). Fats Choose healthy fats more often. Choose monounsaturated and polyunsaturated fats, such as olive and canola oils, avocado oil, flaxseeds, walnuts, almonds, and seeds. Eat  more omega-3 fats. Choose salmon, mackerel, sardines, tuna, flaxseed oil, and ground flaxseeds. Aim to eat fish at least 2 times each week. Check food labels carefully to identify foods with trans fats or high amounts of saturated fat. Limit saturated fats. These are found in animal products, such as meats, butter, and cream. Plant sources of saturated fats  include palm oil, palm kernel oil, and coconut oil. Avoid foods with partially hydrogenated oils in them. These contain trans fats. Examples are stick margarine, some tub margarines, cookies, crackers, and other baked goods. Avoid fried foods. General information Eat more home-cooked food and less restaurant, buffet, and fast food. Limit or avoid alcohol. Limit foods that are high in added sugar and simple starches such as foods made using white refined flour (white breads, pastries, sweets). Lose weight if you are overweight. Losing just 5-10% of your body weight can help your overall health and prevent diseases such as diabetes and heart disease. Monitor your sodium intake, especially if you have high blood pressure. Talk with your health care provider about your sodium intake. Try to incorporate more vegetarian meals weekly. What foods should I eat? Fruits All fresh, canned (in natural juice), or frozen fruits. Vegetables Fresh or frozen vegetables (raw, steamed, roasted, or grilled). Green salads. Grains Most grains. Choose whole wheat and whole grains most of the time. Rice and pasta, including brown rice and pastas made with whole wheat. Meats and other proteins Lean, well-trimmed beef, veal, pork, and lamb. Chicken and [place] without skin. All fish and shellfish. Wild duck, rabbit, pheasant, and venison. Egg whites or low-cholesterol egg substitutes. Dried beans, peas, lentils, and tofu. Seeds and most nuts. Dairy Low-fat or nonfat cheeses, including ricotta and mozzarella. Skim or 1% milk (liquid, powdered, or evaporated). Buttermilk made with low-fat milk. Nonfat or low-fat yogurt. Fats and oils Non-hydrogenated (trans-free) margarines. Vegetable oils, including soybean, sesame, sunflower, olive, avocado, peanut, safflower, corn, canola, and cottonseed. Salad dressings or mayonnaise made with a vegetable oil. Beverages Water (mineral or sparkling). Coffee and tea. Unsweetened ice  tea. Diet beverages. Sweets and desserts Sherbet, gelatin, and fruit ice. Small amounts of dark chocolate. Limit all sweets and desserts. Seasonings and condiments All seasonings and condiments. The items listed above may not be a complete list of foods and beverages you can eat. Contact a dietitian for more options. What foods should I avoid? Fruits Canned fruit in heavy syrup. Fruit in cream or butter sauce. Fried fruit. Limit coconut. Vegetables Vegetables cooked in cheese, cream, or butter sauce. Fried vegetables. Grains Breads made with saturated or trans fats, oils, or whole milk. Croissants. Sweet rolls. Donuts. High-fat crackers, such as cheese crackers and chips. Meats and other proteins Fatty meats, such as hot dogs, ribs, sausage, bacon, rib-eye roast or steak. High-fat deli meats, such as salami and bologna. Caviar. Domestic duck and goose. Organ meats, such as liver. Dairy Cream, sour cream, cream cheese, and creamed cottage cheese. Whole-milk cheeses. Whole or 2% milk (liquid, evaporated, or condensed). Whole buttermilk. Cream sauce or high-fat cheese sauce. Whole-milk yogurt. Fats and oils Meat fat, or shortening. Cocoa butter, hydrogenated oils, palm oil, coconut oil, palm kernel oil. Solid fats and shortenings, including bacon fat, salt pork, lard, and butter. Nondairy cream substitutes. Salad dressings with cheese or sour cream. Beverages Regular sodas and any drinks with added sugar. Sweets and desserts Frosting. Pudding. Cookies. Cakes. Pies. Milk chocolate or white chocolate. Buttered syrups. Full-fat ice cream or ice cream drinks. The items listed above may  not be a complete list of foods and beverages to avoid. Contact a dietitian for more information. Summary Heart-healthy meal planning includes limiting unhealthy fats, increasing healthy fats, limiting salt (sodium) intake and making other diet and lifestyle changes. Lose weight if you are overweight. Losing just  5-10% of your body weight can help your overall health and prevent diseases such as diabetes and heart disease. Focus on eating a balance of foods, including fruits and vegetables, low-fat or nonfat dairy, lean protein, nuts and legumes, whole grains, and heart-healthy oils and fats. This information is not intended to replace advice given to you by your health care provider. Make sure you discuss any questions you have with your health care provider. Document Revised: 02/26/2021 Document Reviewed: 02/26/2021 [employer]vier Patient Education  2024 [employer].

## 2023-11-28 ENCOUNTER — Encounter: Payer: Self-pay | Admitting: Nurse Practitioner

## 2023-11-28 ENCOUNTER — Ambulatory Visit (INDEPENDENT_AMBULATORY_CARE_PROVIDER_SITE_OTHER): Admitting: Nurse Practitioner

## 2023-11-28 ENCOUNTER — Inpatient Hospital Stay
Admission: RE | Admit: 2023-11-28 | Discharge: 2023-11-28 | Disposition: A | Discharge status: Home / Self Care | Source: Ambulatory Visit | Attending: Nurse Practitioner | Admitting: Nurse Practitioner

## 2023-11-28 VITALS — BP 109/75 | HR 86 | Temp 98.7°F | Resp 14 | Ht 69.02 in | Wt 315.4 lb

## 2023-11-28 DIAGNOSIS — Z794 Long term (current) use of insulin: Secondary | ICD-10-CM

## 2023-11-28 DIAGNOSIS — K8050 Calculus of bile duct without cholangitis or cholecystitis without obstruction: Secondary | ICD-10-CM

## 2023-11-28 DIAGNOSIS — E119 Type 2 diabetes mellitus without complications: Secondary | ICD-10-CM | POA: Diagnosis not present

## 2023-11-28 DIAGNOSIS — Z1231 Encounter for screening mammogram for malignant neoplasm of breast: Secondary | ICD-10-CM

## 2023-11-28 MED ORDER — TRIAMCINOLONE ACETONIDE 0.1 % EX CREA: 1.0000 | TOPICAL_CREAM | Freq: Two times a day (BID) | CUTANEOUS | 1 refills | Status: AC

## 2023-11-28 NOTE — Assessment & Plan Note (Signed)
 Chronic, ongoing.  Followed by endo.  A1c 8% recently at hospital. Urine ALB 150 February 2025. Continue collaboration with endocrinology, recent notes reviewed and labs. - Continue current medication regimen as ordered by them.  Ensure to attend visits with endo consistently.   - Recommend she continue to monitor BS at home 2-3 times a day.   - Ensure healthy diet choices at home, reduce candy. - Foot and eye exams up to date. - Vaccinations -- refuses - ARB and statin on board

## 2023-11-28 NOTE — Assessment & Plan Note (Signed)
 Acute and improving.  Recent ERCP. Overall no further nausea or emesis.  Eating regular diet without issue.  Labs BMP and CBC today.

## 2023-11-28 NOTE — Progress Notes (Signed)
 BP 109/75 (BP Location: Left Arm, Patient Position: Sitting, Cuff Size: Large)   Pulse 86   Temp 98.7 F (37.1 C) (Oral)   Resp 14   Ht 5' 9.02 (1.753 m)   Wt (!) 315 lb 6.4 oz (143.1 kg)   SpO2 98%   BMI 46.55 kg/m    Subjective:    Patient ID: Sara Zhang, female    DOB: [birth year], 53 y.o.   MRN: [id number]  HPI: Sara Zhang is a 53 y.o. female  Chief Complaint  Patient presents with   Hospitalization Follow-up    Gallstones. Much better since procedure.    Transition of Care Hospital Follow up.  Admitted to hospital 11/13/23 for emesis and nausea.  Was transported to [facility] for care.  Discharged on 11/16/23. Feeling better since discharge, no further nausea or emesis.  Went through ERCP on 11/15/23 with stones removed. History of gall bladder removal one year ago. Has been eating regular diet since returning home.  Has lost 20 lbs since May with Mounjaro on board for diabetes. A1c 8% in hospital recently. Returns to see endo on Monday, last saw 08/25/23.   Brief Narrative / Interim history: 53 year old female with history of remote cholecystectomy, chronic diastolic CHF, HTN, HLD, DM2 comes into the hospital with right upper quadrant pain for couple of weeks, nausea, vomiting.  MRCP showed choledocholithiasis and GI was consulted and she was admitted to the hospital.   Hospital Course / Discharge diagnoses: Principal problem Choledocholithiasis -based on MRCP, symptoms, LFTs.  Gastroenterology consulted and followed patient while hospitalized.  She underwent an ERCP on 10/11 status post biliary sphincterotomy and several stone removal.  She recovered well postoperatively, tolerating a regular diet, no further abdominal pain, no nausea, no vomiting, LFTs continue to improve and she will be discharged home in stable condition.   Active problems Chronic diastolic CHF-resume home medications on discharge Essential hypertension-resume home medications on  discharge Obesity, morbid-BMI 47.  She would benefit from weight loss.  She is on Mounjaro and has been losing weight and plans to continue  DM2, poorly controlled, with hyperglycemia-continue home medications on discharge   Sepsis ruled out  Hospital/Facility: [facility] Physician: Dr. Vianne D/C Date: 11/16/23  Records Requested: 11/28/23 Records Received: 11/28/23 Records Reviewed: 11/28/23  Diagnoses on Discharge:   Date of interactive Contact within 48 hours of discharge:  Contact was through: none  Date of 7 day or 14 day face-to-face visit:    within 14 days  Outpatient Encounter Medications as of 11/28/2023  Medication Sig Note   albuterol  (VENTOLIN  HFA) 108 (90 Base) MCG/ACT inhaler Inhale 2 puffs into the lungs every 6 (six) hours as needed for wheezing or shortness of breath.    amLODipine  (NORVASC ) 5 MG tablet TAKE 1 TABLET(5 MG) BY MOUTH DAILY    Blood Glucose Monitoring Suppl (CONTOUR NEXT MONITOR) w/Device KIT To check blood sugar 4 times daily.    Calcium  Carbonate-Vitamin D  (OYSTER SHELL CALCIUM  500 + D) 500-125 MG-UNIT TABS Take 1 tablet by mouth daily.     Cholecalciferol  1.25 MG (50000 UT) TABS Take 1 tablet by mouth once a week. 11/13/2023: Take on Mondays    ferrous sulfate  325 (65 FE) MG tablet Take 325 mg by mouth daily with breakfast.    furosemide  (LASIX ) 40 MG tablet TAKE 1 TABLET(40 MG) BY MOUTH TWICE DAILY    glucose blood (CONTOUR NEXT TEST) test strip TEST BLOOD SUGAR ONCE DAILY    Insulin   Glargine (BASAGLAR  KWIKPEN) 100 UNIT/ML Inject 52 Units into the skin at bedtime. (Patient taking differently: Inject 52 Units into the skin at bedtime.)    Insulin  Pen Needle 32G X 4 MM MISC 1 Units by Does not apply route every morning. Pen needles    JARDIANCE  25 MG TABS tablet TAKE 1 TABLET(25 MG) BY MOUTH DAILY    Lancets (ONETOUCH ULTRASOFT) lancets Use as instructed    levonorgestrel  (MIRENA ) 20 MCG/24HR IUD 1 each by Intrauterine route once.      losartan  (COZAAR ) 25 MG tablet Take 1 tablet (25 mg total) by mouth daily. OFFICE VISIT NEEDED FOR ADDITIONAL REFILLS    meloxicam (MOBIC) 15 MG tablet Take 1 tablet (15 mg total) by mouth daily.    metoprolol succinate (TOPROL-XL) 25 MG 24 hr tablet Take 1 tablet (25 mg total) by mouth daily. Take with or immediately following a meal.    ondansetron  (ZOFRAN -ODT) 4 MG disintegrating tablet Take 4 mg by mouth every 8 (eight) hours as needed for nausea or vomiting.    OXYGEN Inhale 2 L into the lungs daily. 10/01/2022: prn   tirzepatide (MOUNJARO) 15 MG/0.5ML Pen Inject 15 mg into the skin once a week. 11/13/2023: Take on Saturdays    triamcinolone  cream (KENALOG ) 0.1 % Apply 1 Application topically 2 (two) times daily.    Facility-Administered Encounter Medications as of 11/28/2023  Medication   triamcinolone  acetonide (KENALOG -40) injection 20 mg   Diagnostic Tests Reviewed/Disposition:     Latest Ref Rng & Units 11/16/2023    5:57 AM 11/15/2023    7:11 AM 11/14/2023    2:41 AM  CBC  WBC 4.0 - 10.5 K/uL 8.0  7.9  8.8   Hemoglobin 12.0 - 15.0 g/dL 87.7  88.4  87.2   Hematocrit 36.0 - 46.0 % 40.7  38.3  42.5   Platelets 150 - 400 K/uL 300  348  346   CMP     Component Value Date/Time   NA 141 11/16/2023 0557   NA 141 09/23/2023 0955   NA 141 09/25/2013 1400   K 3.9 11/16/2023 0557   K 4.2 09/25/2013 1400   CL 107 11/16/2023 0557   CL 101 09/25/2013 1400   CO2 23 11/16/2023 0557   CO2 34 (H) 09/25/2013 1400   GLUCOSE 132 (H) 11/16/2023 0557   GLUCOSE 134 (H) 09/25/2013 1400   BUN 16 11/16/2023 0557   BUN 19 09/23/2023 0955   BUN 12 09/25/2013 1400   CREATININE 1.01 (H) 11/16/2023 0557   CREATININE 0.75 09/25/2013 1400   CALCIUM  7.9 (L) 11/16/2023 0557   CALCIUM  8.8 09/25/2013 1400   PROT 6.4 (L) 11/16/2023 0557   PROT 6.3 09/23/2023 0955   PROT 8.2 06/17/2013 1151   ALBUMIN 2.5 (L) 11/16/2023 0557   ALBUMIN 3.6 (L) 09/23/2023 0955   ALBUMIN 2.9 (L) 06/17/2013 1151   AST  43 (H) 11/16/2023 0557   AST 18 06/17/2013 1151   ALT 82 (H) 11/16/2023 0557   ALT 15 06/17/2013 1151   ALKPHOS 339 (H) 11/16/2023 0557   ALKPHOS 111 06/17/2013 1151   BILITOT 0.5 11/16/2023 0557   BILITOT 0.4 09/23/2023 0955   BILITOT 0.4 06/17/2013 1151   EGFR 65 09/23/2023 0955   GFRNONAA >60 11/16/2023 0557   GFRNONAA >60 09/25/2013 1400    Consults: GI  Discharge Instructions: Follow up with PCP in 1-2 weeks Please obtain BMP/CBC in one week  Disease/illness Education: Reviewed with patient at length today  Home Health/Community Services  Discussions/Referrals: none  Establishment or re-establishment of referral orders for community resources: none  Discussion with other health care providers: Reviewed all recent notes  Assessment and Support of treatment regimen adherence: Reviewed with patient at length today  Appointments Coordinated with: Reviewed with patient at length today  Education for self-management, independent living, and ADLs:  Reviewed with patient at length today     11/28/2023   10:05 AM 10/07/2023    8:15 AM 09/23/2023    9:51 AM 06/20/2023   11:32 AM 06/12/2023   11:25 AM  Depression screen PHQ 2/9  Decreased Interest 0 0 0 0 0  Down, Depressed, Hopeless 0 0 0 0 0  PHQ - 2 Score 0 0 0 0 0  Altered sleeping 0 0  0 0  Tired, decreased energy 0 0  0 1  Change in appetite 0 0  0 0  Feeling bad or failure about yourself  0 0  0 0  Trouble concentrating 0 0  0 0  Moving slowly or fidgety/restless 0 0  0 0  Suicidal thoughts 0 0  0 0  PHQ-9 Score 0 0  0 1  Difficult doing work/chores  Not difficult at all  Not difficult at all Somewhat difficult       11/28/2023   10:05 AM 09/23/2023    9:51 AM 06/20/2023   11:32 AM 06/12/2023   11:25 AM  GAD 7 : Generalized Anxiety Score  Nervous, Anxious, on Edge 0 0 0 0  Control/stop worrying 0 0 0 0  Worry too much - different things 0 0 0 0  Trouble relaxing 0 0 0 0  Restless 0 0 0 0  Easily annoyed or  irritable 0 0 0 0  Afraid - awful might happen 0 0 0 0  Total GAD 7 Score 0 0 0 0  Anxiety Difficulty   Not difficult at all Not difficult at all      Relevant past medical, surgical, family and social history reviewed and updated as indicated. Interim medical history since our last visit reviewed. Allergies and medications reviewed and updated.  Review of Systems  Constitutional:  Negative for activity change, appetite change, diaphoresis, fatigue and fever.  Respiratory:  Negative for cough, chest tightness, shortness of breath and wheezing.   Cardiovascular:  Negative for chest pain, palpitations and leg swelling.  Gastrointestinal: Negative.   Endocrine: Negative for polydipsia, polyphagia and polyuria.  Neurological: Negative.   Psychiatric/Behavioral: Negative.      Per HPI unless specifically indicated above     Objective:    BP 109/75 (BP Location: Left Arm, Patient Position: Sitting, Cuff Size: Large)   Pulse 86   Temp 98.7 F (37.1 C) (Oral)   Resp 14   Ht 5' 9.02 (1.753 m)   Wt (!) 315 lb 6.4 oz (143.1 kg)   SpO2 98%   BMI 46.55 kg/m   Wt Readings from Last 3 Encounters:  11/28/23 (!) 315 lb 6.4 oz (143.1 kg)  11/15/23 (!) 318 lb 9 oz (144.5 kg)  11/13/23 222 lb (100.7 kg)    Physical Exam Vitals and nursing note reviewed.  Constitutional:      General: She is awake. She is not in acute distress.    Appearance: She is well-developed and well-groomed. She is morbidly obese. She is not ill-appearing or toxic-appearing.  HENT:     Head: Normocephalic.     Right Ear: Hearing and external ear normal.     Left Ear:  Hearing and external ear normal.  Eyes:     General: Lids are normal. No scleral icterus.       Right eye: No discharge.        Left eye: No discharge.     Conjunctiva/sclera: Conjunctivae normal.     Pupils: Pupils are equal, round, and reactive to light.  Neck:     Thyroid : No thyromegaly.     Vascular: No carotid bruit.  Cardiovascular:      Rate and Rhythm: Normal rate and regular rhythm.     Heart sounds: Normal heart sounds. No murmur heard.    No gallop.  Pulmonary:     Effort: Pulmonary effort is normal. No accessory muscle usage or respiratory distress.     Breath sounds: Normal breath sounds. No decreased breath sounds, wheezing or rhonchi.  Abdominal:     General: Abdomen is protuberant. Bowel sounds are normal. There is no distension.     Palpations: Abdomen is soft.     Tenderness: There is no abdominal tenderness. There is no right CVA tenderness or left CVA tenderness.  Musculoskeletal:     Cervical back: Normal range of motion and neck supple.     Right lower leg: Edema (trace) present.     Left lower leg: Edema (trace) present.  Lymphadenopathy:     Cervical: No cervical adenopathy.  Skin:    General: Skin is warm and dry.     Capillary Refill: Capillary refill takes less than 2 seconds.  Neurological:     Mental Status: She is alert and oriented to person, place, and time.  Psychiatric:        Attention and Perception: Attention normal.        Mood and Affect: Mood and affect normal.        Speech: Speech normal.        Behavior: Behavior normal. Behavior is cooperative.        Thought Content: Thought content normal.    Results for orders placed or performed during the hospital encounter of 11/13/23  Glucose, capillary   Collection Time: 11/13/23  5:45 PM  Result Value Ref Range   Glucose-Capillary 115 (H) 70 - 99 mg/dL  HIV Antibody (routine testing w rflx)   Collection Time: 11/13/23  6:45 PM  Result Value Ref Range   HIV Screen 4th Generation wRfx Non Reactive Non Reactive  CBC   Collection Time: 11/13/23  6:45 PM  Result Value Ref Range   WBC 10.9 (H) 4.0 - 10.5 K/uL   RBC 4.93 3.87 - 5.11 MIL/uL   Hemoglobin 13.0 12.0 - 15.0 g/dL   HCT 56.9 63.9 - 53.9 %   MCV 87.2 80.0 - 100.0 fL   MCH 26.4 26.0 - 34.0 pg   MCHC 30.2 30.0 - 36.0 g/dL   RDW 83.7 (H) 88.4 - 84.4 %   Platelets 399 150  - 400 K/uL   nRBC 0.0 0.0 - 0.2 %  Creatinine, serum   Collection Time: 11/13/23  6:45 PM  Result Value Ref Range   Creatinine, Ser 1.32 (H) 0.44 - 1.00 mg/dL   GFR, Estimated 48 (L) >60 mL/min  Hemoglobin A1c   Collection Time: 11/13/23  6:45 PM  Result Value Ref Range   Hgb A1c MFr Bld 8.0 (H) 4.8 - 5.6 %   Mean Plasma Glucose 182.9 mg/dL  Glucose, capillary   Collection Time: 11/13/23  8:39 PM  Result Value Ref Range   Glucose-Capillary 189 (H) 70 - 99  mg/dL  Comprehensive metabolic panel   Collection Time: 11/14/23  2:41 AM  Result Value Ref Range   Sodium 140 135 - 145 mmol/L   Potassium 3.8 3.5 - 5.1 mmol/L   Chloride 103 98 - 111 mmol/L   CO2 26 22 - 32 mmol/L   Glucose, Bld 152 (H) 70 - 99 mg/dL   BUN 18 6 - 20 mg/dL   Creatinine, Ser 8.69 (H) 0.44 - 1.00 mg/dL   Calcium  8.1 (L) 8.9 - 10.3 mg/dL   Total Protein 6.8 6.5 - 8.1 g/dL   Albumin 2.5 (L) 3.5 - 5.0 g/dL   AST 898 (H) 15 - 41 U/L   ALT 134 (H) 0 - 44 U/L   Alkaline Phosphatase 471 (H) 38 - 126 U/L   Total Bilirubin 1.7 (H) 0.0 - 1.2 mg/dL   GFR, Estimated 49 (L) >60 mL/min   Anion gap 11 5 - 15  CBC with Differential/Platelet   Collection Time: 11/14/23  2:41 AM  Result Value Ref Range   WBC 8.8 4.0 - 10.5 K/uL   RBC 4.81 3.87 - 5.11 MIL/uL   Hemoglobin 12.7 12.0 - 15.0 g/dL   HCT 57.4 63.9 - 53.9 %   MCV 88.4 80.0 - 100.0 fL   MCH 26.4 26.0 - 34.0 pg   MCHC 29.9 (L) 30.0 - 36.0 g/dL   RDW 83.8 (H) 88.4 - 84.4 %   Platelets 346 150 - 400 K/uL   nRBC 0.0 0.0 - 0.2 %   Neutrophils Relative % 65 %   Neutro Abs 5.9 1.7 - 7.7 K/uL   Lymphocytes Relative 22 %   Lymphs Abs 1.9 0.7 - 4.0 K/uL   Monocytes Relative 9 %   Monocytes Absolute 0.8 0.1 - 1.0 K/uL   Eosinophils Relative 2 %   Eosinophils Absolute 0.1 0.0 - 0.5 K/uL   Basophils Relative 1 %   Basophils Absolute 0.0 0.0 - 0.1 K/uL   Immature Granulocytes 1 %   Abs Immature Granulocytes 0.06 0.00 - 0.07 K/uL  Protime-INR   Collection Time:  11/14/23  2:41 AM  Result Value Ref Range   Prothrombin Time 14.4 11.4 - 15.2 seconds   INR 1.1 0.8 - 1.2  Glucose, capillary   Collection Time: 11/14/23  7:38 AM  Result Value Ref Range   Glucose-Capillary 113 (H) 70 - 99 mg/dL   Comment 1 Notify RN   Glucose, capillary   Collection Time: 11/14/23 12:16 PM  Result Value Ref Range   Glucose-Capillary 163 (H) 70 - 99 mg/dL   Comment 1 Notify RN   Glucose, capillary   Collection Time: 11/14/23  4:44 PM  Result Value Ref Range   Glucose-Capillary 216 (H) 70 - 99 mg/dL   Comment 1 Notify RN   Glucose, capillary   Collection Time: 11/14/23  9:43 PM  Result Value Ref Range   Glucose-Capillary 129 (H) 70 - 99 mg/dL  Comprehensive metabolic panel with GFR   Collection Time: 11/15/23  7:11 AM  Result Value Ref Range   Sodium 140 135 - 145 mmol/L   Potassium 3.8 3.5 - 5.1 mmol/L   Chloride 104 98 - 111 mmol/L   CO2 25 22 - 32 mmol/L   Glucose, Bld 117 (H) 70 - 99 mg/dL   BUN 16 6 - 20 mg/dL   Creatinine, Ser 9.11 0.44 - 1.00 mg/dL   Calcium  7.9 (L) 8.9 - 10.3 mg/dL   Total Protein 6.4 (L) 6.5 - 8.1 g/dL  Albumin 2.5 (L) 3.5 - 5.0 g/dL   AST 45 (H) 15 - 41 U/L   ALT 93 (H) 0 - 44 U/L   Alkaline Phosphatase 381 (H) 38 - 126 U/L   Total Bilirubin 1.0 0.0 - 1.2 mg/dL   GFR, Estimated >39 >39 mL/min   Anion gap 11 5 - 15  CBC   Collection Time: 11/15/23  7:11 AM  Result Value Ref Range   WBC 7.9 4.0 - 10.5 K/uL   RBC 4.35 3.87 - 5.11 MIL/uL   Hemoglobin 11.5 (L) 12.0 - 15.0 g/dL   HCT 61.6 63.9 - 53.9 %   MCV 88.0 80.0 - 100.0 fL   MCH 26.4 26.0 - 34.0 pg   MCHC 30.0 30.0 - 36.0 g/dL   RDW 83.9 (H) 88.4 - 84.4 %   Platelets 348 150 - 400 K/uL   nRBC 0.0 0.0 - 0.2 %  Magnesium   Collection Time: 11/15/23  7:11 AM  Result Value Ref Range   Magnesium 1.9 1.7 - 2.4 mg/dL  Glucose, capillary   Collection Time: 11/15/23  7:44 AM  Result Value Ref Range   Glucose-Capillary 115 (H) 70 - 99 mg/dL  Glucose, capillary    Collection Time: 11/15/23 12:01 PM  Result Value Ref Range   Glucose-Capillary 104 (H) 70 - 99 mg/dL  Glucose, capillary   Collection Time: 11/15/23  1:52 PM  Result Value Ref Range   Glucose-Capillary 89 70 - 99 mg/dL  Glucose, capillary   Collection Time: 11/15/23  3:57 PM  Result Value Ref Range   Glucose-Capillary 114 (H) 70 - 99 mg/dL  Glucose, capillary   Collection Time: 11/15/23  5:29 PM  Result Value Ref Range   Glucose-Capillary 106 (H) 70 - 99 mg/dL  Glucose, capillary   Collection Time: 11/15/23 10:05 PM  Result Value Ref Range   Glucose-Capillary 183 (H) 70 - 99 mg/dL  Comprehensive metabolic panel with GFR   Collection Time: 11/16/23  5:57 AM  Result Value Ref Range   Sodium 141 135 - 145 mmol/L   Potassium 3.9 3.5 - 5.1 mmol/L   Chloride 107 98 - 111 mmol/L   CO2 23 22 - 32 mmol/L   Glucose, Bld 132 (H) 70 - 99 mg/dL   BUN 16 6 - 20 mg/dL   Creatinine, Ser 8.98 (H) 0.44 - 1.00 mg/dL   Calcium  7.9 (L) 8.9 - 10.3 mg/dL   Total Protein 6.4 (L) 6.5 - 8.1 g/dL   Albumin 2.5 (L) 3.5 - 5.0 g/dL   AST 43 (H) 15 - 41 U/L   ALT 82 (H) 0 - 44 U/L   Alkaline Phosphatase 339 (H) 38 - 126 U/L   Total Bilirubin 0.5 0.0 - 1.2 mg/dL   GFR, Estimated >39 >39 mL/min   Anion gap 11 5 - 15  CBC   Collection Time: 11/16/23  5:57 AM  Result Value Ref Range   WBC 8.0 4.0 - 10.5 K/uL   RBC 4.59 3.87 - 5.11 MIL/uL   Hemoglobin 12.2 12.0 - 15.0 g/dL   HCT 59.2 63.9 - 53.9 %   MCV 88.7 80.0 - 100.0 fL   MCH 26.6 26.0 - 34.0 pg   MCHC 30.0 30.0 - 36.0 g/dL   RDW 83.7 (H) 88.4 - 84.4 %   Platelets 300 150 - 400 K/uL   nRBC 0.0 0.0 - 0.2 %  Magnesium   Collection Time: 11/16/23  5:57 AM  Result Value Ref Range   Magnesium  2.0 1.7 - 2.4 mg/dL  Glucose, capillary   Collection Time: 11/16/23  7:41 AM  Result Value Ref Range   Glucose-Capillary 112 (H) 70 - 99 mg/dL   Comment 1 Notify RN    Comment 2 Document in Chart   Glucose, capillary   Collection Time: 11/16/23 11:36 AM   Result Value Ref Range   Glucose-Capillary 170 (H) 70 - 99 mg/dL   Comment 1 Notify RN    Comment 2 Document in Chart       Assessment & Plan:   Problem List Items Addressed This Visit       Digestive   Choledocholithiasis   Acute and improving.  Recent ERCP. Overall no further nausea or emesis.  Eating regular diet without issue.  Labs BMP and CBC today.      Relevant Orders   Basic metabolic panel with GFR   CBC with Differential/Platelet     Endocrine   Type 2 diabetes mellitus treated with insulin  (HCC) - Primary   Chronic, ongoing.  Followed by endo.  A1c 8% recently at hospital. Urine ALB 150 February 2025. Continue collaboration with endocrinology, recent notes reviewed and labs. - Continue current medication regimen as ordered by them.  Ensure to attend visits with endo consistently.   - Recommend she continue to monitor BS at home 2-3 times a day.   - Ensure healthy diet choices at home, reduce candy. - Foot and eye exams up to date. - Vaccinations -- refuses - ARB and statin on board        Follow up plan: Return for as scheduled in February.

## 2023-11-29 ENCOUNTER — Ambulatory Visit: Payer: Self-pay | Admitting: Nurse Practitioner

## 2023-11-29 LAB — CBC WITH DIFFERENTIAL/PLATELET
Basophils Absolute: 0 x10E3/uL (ref 0.0–0.2)
Basos: 1 %
EOS (ABSOLUTE): 0.2 x10E3/uL (ref 0.0–0.4)
Eos: 4 %
Hematocrit: 44.7 % (ref 34.0–46.6)
Hemoglobin: 13.8 g/dL (ref 11.1–15.9)
Immature Grans (Abs): 0 x10E3/uL (ref 0.0–0.1)
Immature Granulocytes: 0 %
Lymphocytes Absolute: 2.3 x10E3/uL (ref 0.7–3.1)
Lymphs: 38 %
MCH: 27.1 pg (ref 26.6–33.0)
MCHC: 30.9 g/dL — ABNORMAL LOW (ref 31.5–35.7)
MCV: 88 fL (ref 79–97)
Monocytes Absolute: 0.6 x10E3/uL (ref 0.1–0.9)
Monocytes: 10 %
Neutrophils Absolute: 2.8 x10E3/uL (ref 1.4–7.0)
Neutrophils: 47 %
Platelets: 354 x10E3/uL (ref 150–450)
RBC: 5.09 x10E6/uL (ref 3.77–5.28)
RDW: 14 % (ref 11.7–15.4)
WBC: 6 x10E3/uL (ref 3.4–10.8)

## 2023-11-29 LAB — BASIC METABOLIC PANEL WITH GFR
BUN/Creatinine Ratio: 16 (ref 9–23)
BUN: 18 mg/dL (ref 6–24)
CO2: 27 mmol/L (ref 20–29)
Calcium: 8.7 mg/dL (ref 8.7–10.2)
Chloride: 101 mmol/L (ref 96–106)
Creatinine, Ser: 1.16 mg/dL — ABNORMAL HIGH (ref 0.57–1.00)
Glucose: 85 mg/dL (ref 70–99)
Potassium: 4 mmol/L (ref 3.5–5.2)
Sodium: 142 mmol/L (ref 134–144)
eGFR: 56 mL/min/1.73 — ABNORMAL LOW (ref >59)

## 2023-11-29 NOTE — Progress Notes (Signed)
 Contacted via MyChart  Good morning Sara Zhang, your labs have returned. Kidney function continues to show Stage 3a kidney disease with no worsening. Blood counts are stable. Any questions? Keep being stellar!!  Thank you for allowing me to participate in your care.  I appreciate you. Kindest regards, Kahiau Schewe

## 2023-12-01 DIAGNOSIS — E1159 Type 2 diabetes mellitus with other circulatory complications: Secondary | ICD-10-CM | POA: Diagnosis not present

## 2023-12-01 DIAGNOSIS — E785 Hyperlipidemia, unspecified: Secondary | ICD-10-CM | POA: Diagnosis not present

## 2023-12-01 DIAGNOSIS — Z6841 Body Mass Index (BMI) 40.0 and over, adult: Secondary | ICD-10-CM | POA: Diagnosis not present

## 2023-12-01 DIAGNOSIS — Z794 Long term (current) use of insulin: Secondary | ICD-10-CM | POA: Diagnosis not present

## 2023-12-01 DIAGNOSIS — E1129 Type 2 diabetes mellitus with other diabetic kidney complication: Secondary | ICD-10-CM | POA: Diagnosis not present

## 2023-12-01 DIAGNOSIS — I152 Hypertension secondary to endocrine disorders: Secondary | ICD-10-CM | POA: Diagnosis not present

## 2023-12-01 DIAGNOSIS — R809 Proteinuria, unspecified: Secondary | ICD-10-CM | POA: Diagnosis not present

## 2023-12-01 DIAGNOSIS — E1169 Type 2 diabetes mellitus with other specified complication: Secondary | ICD-10-CM | POA: Diagnosis not present

## 2023-12-10 ENCOUNTER — Ambulatory Visit: Payer: Self-pay | Admitting: Nurse Practitioner

## 2023-12-10 NOTE — Progress Notes (Signed)
 Contacted via MyChart   Normal mammogram, may repeat in one year:)

## 2024-01-09 ENCOUNTER — Ambulatory Visit: Admitting: Podiatry

## 2024-01-09 DIAGNOSIS — Z91199 Patient's noncompliance with other medical treatment and regimen due to unspecified reason: Secondary | ICD-10-CM

## 2024-01-09 NOTE — Progress Notes (Signed)
 1. No-show for appointment

## 2024-02-09 ENCOUNTER — Other Ambulatory Visit: Payer: Self-pay | Admitting: Internal Medicine

## 2024-03-26 ENCOUNTER — Ambulatory Visit: Admitting: Nurse Practitioner

## 2024-10-12 ENCOUNTER — Ambulatory Visit
